# Patient Record
Sex: Male | Born: 1935
Health system: Southern US, Community
[De-identification: ages and names within clinical notes are randomized; demographics above are authoritative.]

## PROBLEM LIST (undated history)

## (undated) VITALS — BP 116/70 | HR 96 | Resp 16 | Ht 68.0 in | Wt 196.2 lb

## (undated) DIAGNOSIS — C801 Malignant (primary) neoplasm, unspecified: Secondary | ICD-10-CM

## (undated) DIAGNOSIS — D538 Other specified nutritional anemias: Secondary | ICD-10-CM

## (undated) DIAGNOSIS — I872 Venous insufficiency (chronic) (peripheral): Secondary | ICD-10-CM

## (undated) DIAGNOSIS — E559 Vitamin D deficiency, unspecified: Secondary | ICD-10-CM

## (undated) DIAGNOSIS — N183 Chronic kidney disease, stage 3 (moderate): Secondary | ICD-10-CM

## (undated) DIAGNOSIS — R0609 Other forms of dyspnea: Secondary | ICD-10-CM

## (undated) DIAGNOSIS — Z85828 Personal history of other malignant neoplasm of skin: Secondary | ICD-10-CM

## (undated) DIAGNOSIS — I5042 Chronic combined systolic (congestive) and diastolic (congestive) heart failure: Secondary | ICD-10-CM

## (undated) DIAGNOSIS — E785 Hyperlipidemia, unspecified: Secondary | ICD-10-CM

## (undated) DIAGNOSIS — D539 Nutritional anemia, unspecified: Secondary | ICD-10-CM

## (undated) DIAGNOSIS — R0683 Snoring: Secondary | ICD-10-CM

## (undated) DIAGNOSIS — E669 Obesity, unspecified: Secondary | ICD-10-CM

## (undated) DIAGNOSIS — E118 Type 2 diabetes mellitus with unspecified complications: Secondary | ICD-10-CM

## (undated) DIAGNOSIS — I10 Essential (primary) hypertension: Secondary | ICD-10-CM

## (undated) DIAGNOSIS — J449 Chronic obstructive pulmonary disease, unspecified: Secondary | ICD-10-CM

## (undated) DIAGNOSIS — R413 Other amnesia: Secondary | ICD-10-CM

## (undated) DIAGNOSIS — H269 Unspecified cataract: Secondary | ICD-10-CM

## (undated) DIAGNOSIS — R972 Elevated prostate specific antigen [PSA]: Secondary | ICD-10-CM

## (undated) DIAGNOSIS — M1990 Unspecified osteoarthritis, unspecified site: Secondary | ICD-10-CM

## (undated) DIAGNOSIS — T7840XA Allergy, unspecified, initial encounter: Secondary | ICD-10-CM

## (undated) DIAGNOSIS — R609 Edema, unspecified: Secondary | ICD-10-CM

## (undated) HISTORY — DX: Other specified nutritional anemias: D53.8

## (undated) HISTORY — DX: Personal history of other malignant neoplasm of skin: Z85.828

## (undated) HISTORY — DX: Other forms of dyspnea: R06.09

## (undated) HISTORY — DX: Malignant (primary) neoplasm, unspecified: C80.1

## (undated) HISTORY — DX: Type 2 diabetes mellitus with unspecified complications: E11.8

## (undated) HISTORY — PX: TOTAL KNEE ARTHROPLASTY: SHX125

## (undated) HISTORY — DX: Chronic obstructive pulmonary disease, unspecified: J44.9

## (undated) HISTORY — PX: KNEE SURGERY: SHX244

## (undated) HISTORY — DX: Elevated prostate specific antigen (PSA): R97.20

## (undated) HISTORY — DX: Allergy, unspecified, initial encounter: T78.40XA

## (undated) HISTORY — DX: Nutritional anemia, unspecified: D53.9

## (undated) HISTORY — DX: Vitamin D deficiency, unspecified: E55.9

## (undated) HISTORY — PX: JOINT REPLACEMENT: SHX530

## (undated) HISTORY — PX: INGUINAL HERNIA REPAIR: SUR1180

## (undated) HISTORY — PX: MELANOMA EXCISION: SHX5266

## (undated) HISTORY — DX: Chronic combined systolic (congestive) and diastolic (congestive) heart failure: I50.42

## (undated) HISTORY — DX: Unspecified cataract: H26.9

## (undated) HISTORY — DX: Unspecified osteoarthritis, unspecified site: M19.90

## (undated) HISTORY — DX: Other amnesia: R41.3

## (undated) HISTORY — DX: Hyperlipidemia, unspecified: E78.5

## (undated) HISTORY — DX: Obesity, unspecified: E66.9

## (undated) HISTORY — DX: Snoring: R06.83

## (undated) HISTORY — PX: CHOLECYSTECTOMY: SHX55

## (undated) HISTORY — DX: Chronic kidney disease, stage 3 (moderate): N18.3

## (undated) HISTORY — DX: Venous insufficiency (chronic) (peripheral): I87.2

## (undated) HISTORY — DX: Edema, unspecified: R60.9

## (undated) HISTORY — DX: Essential (primary) hypertension: I10

---

## 2001-03-13 ENCOUNTER — Encounter: Payer: Self-pay | Admitting: Surgery

## 2001-03-16 ENCOUNTER — Encounter (INDEPENDENT_AMBULATORY_CARE_PROVIDER_SITE_OTHER): Payer: Self-pay | Admitting: Specialist

## 2001-03-16 ENCOUNTER — Encounter: Payer: Self-pay | Admitting: Surgery

## 2001-03-16 ENCOUNTER — Ambulatory Visit (HOSPITAL_COMMUNITY): Admission: RE | Admit: 2001-03-16 | Discharge: 2001-03-16 | Payer: Self-pay | Admitting: Surgery

## 2001-10-17 ENCOUNTER — Encounter: Payer: Self-pay | Admitting: Oncology

## 2001-10-17 ENCOUNTER — Ambulatory Visit (HOSPITAL_COMMUNITY): Admission: RE | Admit: 2001-10-17 | Discharge: 2001-10-17 | Payer: Self-pay | Admitting: Oncology

## 2002-04-17 ENCOUNTER — Encounter: Payer: Self-pay | Admitting: Oncology

## 2002-04-17 ENCOUNTER — Ambulatory Visit (HOSPITAL_COMMUNITY): Admission: RE | Admit: 2002-04-17 | Discharge: 2002-04-17 | Payer: Self-pay | Admitting: Oncology

## 2002-06-13 ENCOUNTER — Encounter: Payer: Self-pay | Admitting: Orthopedic Surgery

## 2002-06-13 ENCOUNTER — Inpatient Hospital Stay (HOSPITAL_COMMUNITY): Admission: RE | Admit: 2002-06-13 | Discharge: 2002-06-18 | Payer: Self-pay | Admitting: Orthopedic Surgery

## 2002-06-17 ENCOUNTER — Encounter: Payer: Self-pay | Admitting: Orthopedic Surgery

## 2002-07-30 ENCOUNTER — Ambulatory Visit (HOSPITAL_COMMUNITY): Admission: RE | Admit: 2002-07-30 | Discharge: 2002-07-30 | Payer: Self-pay | Admitting: General Surgery

## 2002-08-30 ENCOUNTER — Encounter: Payer: Self-pay | Admitting: Orthopedic Surgery

## 2002-08-30 ENCOUNTER — Encounter: Admission: RE | Admit: 2002-08-30 | Discharge: 2002-08-30 | Payer: Self-pay | Admitting: Orthopedic Surgery

## 2003-01-23 ENCOUNTER — Ambulatory Visit (HOSPITAL_BASED_OUTPATIENT_CLINIC_OR_DEPARTMENT_OTHER): Admission: RE | Admit: 2003-01-23 | Discharge: 2003-01-23 | Payer: Self-pay | Admitting: Family Medicine

## 2003-02-17 ENCOUNTER — Ambulatory Visit (HOSPITAL_COMMUNITY): Admission: RE | Admit: 2003-02-17 | Discharge: 2003-02-17 | Payer: Self-pay | Admitting: Gastroenterology

## 2003-03-20 LAB — HM COLONOSCOPY: HM Colonoscopy: NORMAL

## 2003-04-23 ENCOUNTER — Ambulatory Visit: Admission: RE | Admit: 2003-04-23 | Discharge: 2003-04-23 | Payer: Self-pay | Admitting: Oncology

## 2003-04-23 ENCOUNTER — Encounter: Payer: Self-pay | Admitting: Oncology

## 2003-05-06 ENCOUNTER — Encounter: Admission: RE | Admit: 2003-05-06 | Discharge: 2003-05-06 | Payer: Self-pay | Admitting: Internal Medicine

## 2003-05-14 ENCOUNTER — Inpatient Hospital Stay (HOSPITAL_COMMUNITY): Admission: RE | Admit: 2003-05-14 | Discharge: 2003-05-19 | Payer: Self-pay | Admitting: Orthopedic Surgery

## 2003-05-14 ENCOUNTER — Encounter (INDEPENDENT_AMBULATORY_CARE_PROVIDER_SITE_OTHER): Payer: Self-pay | Admitting: *Deleted

## 2003-05-15 ENCOUNTER — Encounter: Payer: Self-pay | Admitting: Orthopedic Surgery

## 2003-06-03 ENCOUNTER — Encounter: Admission: RE | Admit: 2003-06-03 | Discharge: 2003-06-03 | Payer: Self-pay | Admitting: Infectious Diseases

## 2003-06-16 ENCOUNTER — Ambulatory Visit (HOSPITAL_COMMUNITY): Admission: RE | Admit: 2003-06-16 | Discharge: 2003-06-16 | Payer: Self-pay | Admitting: Infectious Diseases

## 2003-06-16 ENCOUNTER — Encounter: Payer: Self-pay | Admitting: Infectious Diseases

## 2003-06-25 ENCOUNTER — Inpatient Hospital Stay (HOSPITAL_COMMUNITY): Admission: RE | Admit: 2003-06-25 | Discharge: 2003-06-30 | Payer: Self-pay | Admitting: Orthopedic Surgery

## 2003-07-28 ENCOUNTER — Encounter: Admission: RE | Admit: 2003-07-28 | Discharge: 2003-10-26 | Payer: Self-pay | Admitting: Orthopedic Surgery

## 2003-10-17 ENCOUNTER — Ambulatory Visit (HOSPITAL_COMMUNITY): Admission: RE | Admit: 2003-10-17 | Discharge: 2003-10-17 | Payer: Self-pay | Admitting: General Surgery

## 2003-11-06 ENCOUNTER — Encounter: Admission: RE | Admit: 2003-11-06 | Discharge: 2004-01-01 | Payer: Self-pay | Admitting: Orthopedic Surgery

## 2004-02-26 ENCOUNTER — Ambulatory Visit (HOSPITAL_COMMUNITY): Admission: RE | Admit: 2004-02-26 | Discharge: 2004-02-26 | Payer: Self-pay | Admitting: Surgery

## 2004-02-28 ENCOUNTER — Ambulatory Visit (HOSPITAL_COMMUNITY): Admission: RE | Admit: 2004-02-28 | Discharge: 2004-02-28 | Payer: Self-pay | Admitting: Surgery

## 2004-03-03 ENCOUNTER — Ambulatory Visit (HOSPITAL_COMMUNITY): Admission: RE | Admit: 2004-03-03 | Discharge: 2004-03-03 | Payer: Self-pay | Admitting: Oncology

## 2004-04-07 ENCOUNTER — Ambulatory Visit (HOSPITAL_COMMUNITY): Admission: RE | Admit: 2004-04-07 | Discharge: 2004-04-07 | Payer: Self-pay | Admitting: Surgery

## 2004-04-07 ENCOUNTER — Ambulatory Visit (HOSPITAL_BASED_OUTPATIENT_CLINIC_OR_DEPARTMENT_OTHER): Admission: RE | Admit: 2004-04-07 | Discharge: 2004-04-07 | Payer: Self-pay | Admitting: Surgery

## 2004-04-07 ENCOUNTER — Encounter (INDEPENDENT_AMBULATORY_CARE_PROVIDER_SITE_OTHER): Payer: Self-pay | Admitting: *Deleted

## 2004-05-05 ENCOUNTER — Encounter (INDEPENDENT_AMBULATORY_CARE_PROVIDER_SITE_OTHER): Payer: Self-pay | Admitting: *Deleted

## 2004-05-05 ENCOUNTER — Observation Stay (HOSPITAL_COMMUNITY): Admission: RE | Admit: 2004-05-05 | Discharge: 2004-05-06 | Payer: Self-pay | Admitting: Surgery

## 2004-05-13 ENCOUNTER — Ambulatory Visit (HOSPITAL_COMMUNITY): Admission: RE | Admit: 2004-05-13 | Discharge: 2004-05-13 | Payer: Self-pay | Admitting: Oncology

## 2004-08-16 ENCOUNTER — Ambulatory Visit (HOSPITAL_COMMUNITY): Admission: RE | Admit: 2004-08-16 | Discharge: 2004-08-16 | Payer: Self-pay | Admitting: Oncology

## 2004-08-19 ENCOUNTER — Ambulatory Visit: Payer: Self-pay | Admitting: Oncology

## 2004-08-24 ENCOUNTER — Ambulatory Visit (HOSPITAL_COMMUNITY): Admission: RE | Admit: 2004-08-24 | Discharge: 2004-08-24 | Payer: Self-pay | Admitting: Oncology

## 2004-10-01 ENCOUNTER — Ambulatory Visit (HOSPITAL_COMMUNITY): Admission: RE | Admit: 2004-10-01 | Discharge: 2004-10-01 | Payer: Self-pay | Admitting: General Surgery

## 2005-03-30 ENCOUNTER — Ambulatory Visit: Payer: Self-pay | Admitting: Oncology

## 2005-04-01 ENCOUNTER — Ambulatory Visit (HOSPITAL_COMMUNITY): Admission: RE | Admit: 2005-04-01 | Discharge: 2005-04-01 | Payer: Self-pay | Admitting: Oncology

## 2005-09-28 ENCOUNTER — Ambulatory Visit: Payer: Self-pay | Admitting: Oncology

## 2005-10-05 ENCOUNTER — Ambulatory Visit (HOSPITAL_COMMUNITY): Admission: RE | Admit: 2005-10-05 | Discharge: 2005-10-05 | Payer: Self-pay | Admitting: Oncology

## 2006-05-30 ENCOUNTER — Ambulatory Visit: Payer: Self-pay | Admitting: Oncology

## 2008-09-25 ENCOUNTER — Encounter: Payer: Self-pay | Admitting: Internal Medicine

## 2008-11-10 ENCOUNTER — Ambulatory Visit: Payer: Self-pay | Admitting: Internal Medicine

## 2008-11-10 DIAGNOSIS — Z85828 Personal history of other malignant neoplasm of skin: Secondary | ICD-10-CM

## 2008-11-10 DIAGNOSIS — E119 Type 2 diabetes mellitus without complications: Secondary | ICD-10-CM | POA: Insufficient documentation

## 2008-11-10 DIAGNOSIS — I10 Essential (primary) hypertension: Secondary | ICD-10-CM

## 2008-11-10 DIAGNOSIS — E118 Type 2 diabetes mellitus with unspecified complications: Secondary | ICD-10-CM

## 2008-11-10 DIAGNOSIS — M1990 Unspecified osteoarthritis, unspecified site: Secondary | ICD-10-CM | POA: Insufficient documentation

## 2008-11-10 DIAGNOSIS — E785 Hyperlipidemia, unspecified: Secondary | ICD-10-CM | POA: Insufficient documentation

## 2008-11-10 DIAGNOSIS — E559 Vitamin D deficiency, unspecified: Secondary | ICD-10-CM | POA: Insufficient documentation

## 2008-11-10 HISTORY — DX: Personal history of other malignant neoplasm of skin: Z85.828

## 2008-11-10 HISTORY — DX: Vitamin D deficiency, unspecified: E55.9

## 2008-11-10 HISTORY — DX: Type 2 diabetes mellitus with unspecified complications: E11.8

## 2008-11-10 HISTORY — DX: Hyperlipidemia, unspecified: E78.5

## 2008-11-10 LAB — CONVERTED CEMR LAB
ALT: 27 units/L (ref 0–53)
BUN: 17 mg/dL (ref 6–23)
Basophils Absolute: 0 10*3/uL (ref 0.0–0.1)
Bilirubin, Direct: 0.1 mg/dL (ref 0.0–0.3)
CO2: 31 meq/L (ref 19–32)
Cholesterol: 133 mg/dL (ref 0–200)
Eosinophils Absolute: 0.3 10*3/uL (ref 0.0–0.7)
GFR calc Af Amer: 105 mL/min
GFR calc non Af Amer: 87 mL/min
HCT: 44.6 % (ref 39.0–52.0)
HDL: 40.1 mg/dL (ref >39.0)
Ketones, ur: NEGATIVE mg/dL
LDL Cholesterol: 81 mg/dL (ref 0–99)
Leukocytes, UA: NEGATIVE
Lymphocytes Relative: 37.6 % (ref 12.0–46.0)
MCHC: 34.5 g/dL (ref 30.0–36.0)
MCV: 85.4 fL (ref 78.0–100.0)
Microalb Creat Ratio: 2.3 mg/g (ref 0.0–30.0)
Microalb, Ur: 0.4 mg/dL (ref 0.0–1.9)
Mucus, UA: NEGATIVE
Neutro Abs: 3.9 10*3/uL (ref 1.4–7.7)
Platelets: 218 10*3/uL (ref 150–400)
Potassium: 4.6 meq/L (ref 3.5–5.1)
Sodium: 143 meq/L (ref 135–145)
Total Bilirubin: 1 mg/dL (ref 0.3–1.2)
Total Protein: 6.4 g/dL (ref 6.0–8.3)
Triglycerides: 59 mg/dL (ref 0–149)
Urine Glucose: NEGATIVE mg/dL
VLDL: 12 mg/dL (ref 0–40)
Vit D, 25-Hydroxy: 42 ng/mL (ref 30–89)
pH: 5 (ref 5.0–8.0)

## 2008-11-11 ENCOUNTER — Encounter: Payer: Self-pay | Admitting: Internal Medicine

## 2009-01-27 ENCOUNTER — Encounter: Payer: Self-pay | Admitting: Internal Medicine

## 2009-02-10 ENCOUNTER — Ambulatory Visit: Payer: Self-pay | Admitting: Internal Medicine

## 2009-02-10 LAB — CONVERTED CEMR LAB
ALT: 33 units/L (ref 0–53)
AST: 29 units/L (ref 0–37)
Albumin: 3.7 g/dL (ref 3.5–5.2)
Alkaline Phosphatase: 95 units/L (ref 39–117)
BUN: 14 mg/dL (ref 6–23)
Basophils Absolute: 0.1 10*3/uL (ref 0.0–0.1)
Basophils Relative: 0.8 % (ref 0.0–3.0)
Bilirubin, Direct: 0.2 mg/dL (ref 0.0–0.3)
CO2: 32 meq/L (ref 19–32)
Calcium: 8.9 mg/dL (ref 8.4–10.5)
GFR calc non Af Amer: 86.49 mL/min (ref >60)
Hemoglobin, Urine: NEGATIVE
Ketones, ur: NEGATIVE mg/dL
LDL Goal: 100 mg/dL
Lymphs Abs: 2.9 10*3/uL (ref 0.7–4.0)
MCHC: 35.1 g/dL (ref 30.0–36.0)
Microalb Creat Ratio: 2 mg/g (ref 0.0–30.0)
Monocytes Absolute: 0.7 10*3/uL (ref 0.1–1.0)
Nitrite: NEGATIVE
Platelets: 229 10*3/uL (ref 150.0–400.0)
RDW: 12.9 % (ref 11.5–14.6)
Sodium: 143 meq/L (ref 135–145)
TSH: 2.14 microintl units/mL (ref 0.35–5.50)
Total Bilirubin: 1 mg/dL (ref 0.3–1.2)
Total CHOL/HDL Ratio: 3
Total CK: 89 units/L (ref 7–232)
Total Protein: 6.4 g/dL (ref 6.0–8.3)
VLDL: 13.2 mg/dL (ref 0.0–40.0)
WBC: 8.5 10*3/uL (ref 4.5–10.5)
pH: 5.5 (ref 5.0–8.0)

## 2009-02-11 ENCOUNTER — Encounter: Payer: Self-pay | Admitting: Internal Medicine

## 2009-05-14 ENCOUNTER — Ambulatory Visit: Payer: Self-pay | Admitting: Internal Medicine

## 2009-05-14 DIAGNOSIS — R413 Other amnesia: Secondary | ICD-10-CM

## 2009-05-14 HISTORY — DX: Other amnesia: R41.3

## 2009-05-14 LAB — CONVERTED CEMR LAB
CO2: 31 meq/L (ref 19–32)
Chloride: 105 meq/L (ref 96–112)
Potassium: 4.6 meq/L (ref 3.5–5.1)
Total CK: 67 units/L (ref 7–232)

## 2009-06-11 ENCOUNTER — Ambulatory Visit: Payer: Self-pay | Admitting: Internal Medicine

## 2009-06-11 DIAGNOSIS — R609 Edema, unspecified: Secondary | ICD-10-CM

## 2009-06-11 HISTORY — DX: Edema, unspecified: R60.9

## 2009-08-03 ENCOUNTER — Telehealth: Payer: Self-pay | Admitting: Internal Medicine

## 2009-09-10 ENCOUNTER — Ambulatory Visit: Payer: Self-pay | Admitting: Internal Medicine

## 2009-09-10 LAB — CONVERTED CEMR LAB
AST: 30 units/L (ref 0–37)
Albumin: 3.9 g/dL (ref 3.5–5.2)
Alkaline Phosphatase: 87 units/L (ref 39–117)
Basophils Absolute: 0.1 10*3/uL (ref 0.0–0.1)
Basophils Relative: 0.7 % (ref 0.0–3.0)
Bilirubin, Direct: 0.1 mg/dL (ref 0.0–0.3)
CO2: 28 meq/L (ref 19–32)
Cholesterol: 183 mg/dL (ref 0–200)
Creatinine, Ser: 1 mg/dL (ref 0.4–1.5)
Creatinine,U: 274.8 mg/dL
Eosinophils Relative: 2.8 % (ref 0.0–5.0)
GFR calc non Af Amer: 76.47 mL/min (ref >60)
HCT: 44.2 % (ref 39.0–52.0)
Hemoglobin, Urine: NEGATIVE
Lymphocytes Relative: 34.6 % (ref 12.0–46.0)
MCV: 87.8 fL (ref 78.0–100.0)
Microalb Creat Ratio: 2.5 mg/g (ref 0.0–30.0)
Monocytes Absolute: 0.7 10*3/uL (ref 0.1–1.0)
Neutrophils Relative %: 52.5 % (ref 43.0–77.0)
RDW: 13.9 % (ref 11.5–14.6)
Sodium: 138 meq/L (ref 135–145)
Total CHOL/HDL Ratio: 4
Total Protein: 6.4 g/dL (ref 6.0–8.3)
Urine Glucose: NEGATIVE mg/dL
VLDL: 10.6 mg/dL (ref 0.0–40.0)
pH: 5 (ref 5.0–8.0)

## 2009-09-11 ENCOUNTER — Encounter: Payer: Self-pay | Admitting: Internal Medicine

## 2009-10-19 ENCOUNTER — Encounter: Payer: Self-pay | Admitting: Internal Medicine

## 2010-01-07 ENCOUNTER — Ambulatory Visit: Payer: Self-pay | Admitting: Internal Medicine

## 2010-01-07 LAB — CONVERTED CEMR LAB
ALT: 21 units/L (ref 0–53)
BUN: 19 mg/dL (ref 6–23)
Basophils Relative: 0.5 % (ref 0.0–3.0)
Bilirubin Urine: NEGATIVE
CO2: 29 meq/L (ref 19–32)
Creatinine, Ser: 0.8 mg/dL (ref 0.4–1.5)
Glucose, Bld: 74 mg/dL (ref 70–99)
HDL: 44.6 mg/dL (ref >39.00)
Hemoglobin: 14.7 g/dL (ref 13.0–17.0)
Ketones, ur: NEGATIVE mg/dL
LDL Cholesterol: 109 mg/dL — ABNORMAL HIGH (ref 0–99)
Leukocytes, UA: NEGATIVE
Lymphocytes Relative: 36.5 % (ref 12.0–46.0)
Monocytes Relative: 8.1 % (ref 3.0–12.0)
Neutrophils Relative %: 52.2 % (ref 43.0–77.0)
Nitrite: NEGATIVE
Potassium: 4.4 meq/L (ref 3.5–5.1)
RDW: 14.7 % — ABNORMAL HIGH (ref 11.5–14.6)
Sodium: 143 meq/L (ref 135–145)
Specific Gravity, Urine: >=1.03 (ref 1.000–1.030)
Total Bilirubin: 1 mg/dL (ref 0.3–1.2)
Triglycerides: 49 mg/dL (ref 0.0–149.0)
Urobilinogen, UA: 0.2 (ref 0.0–1.0)
Vit D, 25-Hydroxy: 32 ng/mL (ref 30–89)
WBC: 8.5 10*3/uL (ref 4.5–10.5)
pH: 6 (ref 5.0–8.0)

## 2010-01-27 ENCOUNTER — Telehealth: Payer: Self-pay | Admitting: Internal Medicine

## 2010-05-12 ENCOUNTER — Ambulatory Visit: Payer: Self-pay | Admitting: Internal Medicine

## 2010-05-12 LAB — CONVERTED CEMR LAB
AST: 36 units/L (ref 0–37)
Albumin: 4 g/dL (ref 3.5–5.2)
Alkaline Phosphatase: 86 units/L (ref 39–117)
BUN: 19 mg/dL (ref 6–23)
Basophils Absolute: 0 10*3/uL (ref 0.0–0.1)
Calcium: 9.7 mg/dL (ref 8.4–10.5)
Cholesterol: 98 mg/dL (ref 0–200)
Creatinine, Ser: 0.9 mg/dL (ref 0.4–1.5)
Eosinophils Relative: 1.7 % (ref 0.0–5.0)
GFR calc non Af Amer: 84.05 mL/min (ref >60)
Glucose, Bld: 82 mg/dL (ref 70–99)
HCT: 42 % (ref 39.0–52.0)
Hemoglobin: 14.4 g/dL (ref 13.0–17.0)
Hgb A1c MFr Bld: 8.1 % — ABNORMAL HIGH (ref 4.6–6.5)
Lymphocytes Relative: 32.1 % (ref 12.0–46.0)
MCV: 87.2 fL (ref 78.0–100.0)
Neutro Abs: 5.6 10*3/uL (ref 1.4–7.7)
Potassium: 5.1 meq/L (ref 3.5–5.1)
RDW: 13.8 % (ref 11.5–14.6)
TSH: 1.64 microintl units/mL (ref 0.35–5.50)
Total Bilirubin: 0.7 mg/dL (ref 0.3–1.2)
Triglycerides: 50 mg/dL (ref 0.0–149.0)

## 2010-07-26 ENCOUNTER — Inpatient Hospital Stay (HOSPITAL_COMMUNITY): Admission: RE | Admit: 2010-07-26 | Discharge: 2010-07-29 | Payer: Self-pay | Admitting: Orthopedic Surgery

## 2010-07-31 ENCOUNTER — Emergency Department (HOSPITAL_COMMUNITY)
Admission: EM | Admit: 2010-07-31 | Discharge: 2010-08-01 | Payer: Self-pay | Source: Home / Self Care | Admitting: Emergency Medicine

## 2010-08-02 ENCOUNTER — Encounter: Payer: Self-pay | Admitting: Internal Medicine

## 2010-08-04 ENCOUNTER — Telehealth: Payer: Self-pay | Admitting: Internal Medicine

## 2010-08-11 ENCOUNTER — Telehealth: Payer: Self-pay | Admitting: Internal Medicine

## 2010-08-11 ENCOUNTER — Ambulatory Visit: Payer: Self-pay | Admitting: Internal Medicine

## 2010-08-11 ENCOUNTER — Encounter: Payer: Self-pay | Admitting: Internal Medicine

## 2010-08-11 DIAGNOSIS — D538 Other specified nutritional anemias: Secondary | ICD-10-CM | POA: Insufficient documentation

## 2010-08-11 DIAGNOSIS — I959 Hypotension, unspecified: Secondary | ICD-10-CM | POA: Insufficient documentation

## 2010-08-11 HISTORY — DX: Other specified nutritional anemias: D53.8

## 2010-08-11 LAB — CONVERTED CEMR LAB
Albumin: 2.9 g/dL — ABNORMAL LOW (ref 3.5–5.2)
Alkaline Phosphatase: 102 units/L (ref 39–117)
BUN: 12 mg/dL (ref 6–23)
Basophils Absolute: 0 10*3/uL (ref 0.0–0.1)
Basophils Relative: 0.3 % (ref 0.0–3.0)
Bilirubin, Direct: 0.2 mg/dL (ref 0.0–0.3)
CK-MB: 1.8 ng/mL (ref 0.3–4.0)
Calcium: 8.7 mg/dL (ref 8.4–10.5)
Chloride: 99 meq/L (ref 96–112)
Creatinine, Ser: 1 mg/dL (ref 0.4–1.5)
Eosinophils Absolute: 0.1 10*3/uL (ref 0.0–0.7)
Folate: 10.1 ng/mL
Glucose, Bld: 286 mg/dL — ABNORMAL HIGH (ref 70–99)
Hemoglobin: 10.8 g/dL — ABNORMAL LOW (ref 13.0–17.0)
Hgb A1c MFr Bld: 8 % — ABNORMAL HIGH (ref 4.6–6.5)
INR: 1.3 — ABNORMAL HIGH (ref 0.8–1.0)
Lymphocytes Relative: 12.4 % (ref 12.0–46.0)
Lymphs Abs: 1.7 10*3/uL (ref 0.7–4.0)
Monocytes Absolute: 1 10*3/uL (ref 0.1–1.0)
Neutro Abs: 11.1 10*3/uL — ABNORMAL HIGH (ref 1.4–7.7)
Neutrophils Relative %: 78.9 % — ABNORMAL HIGH (ref 43.0–77.0)
RBC: 3.69 M/uL — ABNORMAL LOW (ref 4.22–5.81)
RDW: 14.2 % (ref 11.5–14.6)
Relative Index: 4.5 — ABNORMAL HIGH (ref 0.0–2.5)
Saturation Ratios: 14.9 % — ABNORMAL LOW (ref 20.0–50.0)
TSH: 3.01 microintl units/mL (ref 0.35–5.50)
Total CK: 40 units/L (ref 7–232)
Total Protein: 5.4 g/dL — ABNORMAL LOW (ref 6.0–8.3)
WBC: 14 10*3/uL — ABNORMAL HIGH (ref 4.5–10.5)

## 2010-08-12 ENCOUNTER — Encounter: Payer: Self-pay | Admitting: Internal Medicine

## 2010-08-12 ENCOUNTER — Telehealth: Payer: Self-pay | Admitting: Internal Medicine

## 2010-08-13 ENCOUNTER — Ambulatory Visit: Payer: Self-pay | Admitting: Internal Medicine

## 2010-08-13 DIAGNOSIS — E538 Deficiency of other specified B group vitamins: Secondary | ICD-10-CM | POA: Insufficient documentation

## 2010-08-27 ENCOUNTER — Ambulatory Visit: Payer: Self-pay | Admitting: Internal Medicine

## 2010-08-30 ENCOUNTER — Encounter
Admission: RE | Admit: 2010-08-30 | Discharge: 2010-09-28 | Payer: Self-pay | Source: Home / Self Care | Attending: Orthopedic Surgery | Admitting: Orthopedic Surgery

## 2010-09-06 ENCOUNTER — Encounter: Payer: Self-pay | Admitting: Internal Medicine

## 2010-09-09 ENCOUNTER — Ambulatory Visit
Admission: RE | Admit: 2010-09-09 | Discharge: 2010-09-09 | Payer: Self-pay | Source: Home / Self Care | Attending: Internal Medicine | Admitting: Internal Medicine

## 2010-09-26 LAB — CONVERTED CEMR LAB
ALT: 24 units/L (ref 0–53)
BUN: 13 mg/dL (ref 6–23)
Basophils Absolute: 0 10*3/uL (ref 0.0–0.1)
Basophils Relative: 0.6 % (ref 0.0–3.0)
Calcium: 9.1 mg/dL (ref 8.4–10.5)
Chloride: 104 meq/L (ref 96–112)
GFR calc non Af Amer: 86.42 mL/min (ref >60)
HCT: 46.1 % (ref 39.0–52.0)
Hemoglobin, Urine: NEGATIVE
Hemoglobin: 15.3 g/dL (ref 13.0–17.0)
Hgb A1c MFr Bld: 7.5 % — ABNORMAL HIGH (ref 4.6–6.5)
Leukocytes, UA: NEGATIVE
Lymphocytes Relative: 32.5 % (ref 12.0–46.0)
Lymphs Abs: 2.7 10*3/uL (ref 0.7–4.0)
Monocytes Absolute: 0.6 10*3/uL (ref 0.1–1.0)
Monocytes Relative: 7.4 % (ref 3.0–12.0)
Neutrophils Relative %: 56.6 % (ref 43.0–77.0)
Nitrite: NEGATIVE
Platelets: 236 10*3/uL (ref 150.0–400.0)
Potassium: 4.5 meq/L (ref 3.5–5.1)
RBC: 5.26 M/uL (ref 4.22–5.81)
RDW: 13 % (ref 11.5–14.6)
Specific Gravity, Urine: >=1.03 (ref 1.000–1.030)
Total Protein, Urine: NEGATIVE mg/dL
Total Protein: 6.5 g/dL (ref 6.0–8.3)
Urobilinogen, UA: 0.2 (ref 0.0–1.0)

## 2010-09-30 NOTE — Assessment & Plan Note (Signed)
Summary: 3 month follow up-lb   Vital Signs:  Patient profile:   77 year old male Height:      68 inches Weight:      196 pounds BMI:     29.91 O2 Sat:      94 % on Room air Temp:     97.5 degrees F oral Pulse rate:   97 / minute Pulse rhythm:   regular Resp:     16 per minute BP sitting:   82 / 50  (left arm) Cuff size:   large  Vitals Entered By: Rock Nephew CMA (August 11, 2010 10:17 AM)  Nutrition Counseling: Patient's BMI is greater than 25 and therefore counseled on weight management options.  O2 Flow:  Room air CC: follow-up visit// pt c/o weaknees and dizziness, Hypertension Management Is Patient Diabetic? Yes Did you bring your meter with you today? No  Does patient need assistance? Functional Status Self care Ambulation Normal   Primary Care Chrysa Rampy:  Etta Grandchild MD  CC:  follow-up visit// pt c/o weaknees and dizziness and Hypertension Management.  History of Present Illness: He returns for f/up and he tells me that he has been having diarrhea and dizziness with low blood pressure for about 2 weeks. His diarrhea is about 4 stools per day that are loose and high volume but no blood or mucous and no abd pain or cramping. He has been on coumadin since he had left knee surgery about 3 weeks ago and has been seeing a Insurance underwriter about urinary retention and a UTI, Rapaflo is  a new med as of 3 days ago given to him by Dr. Isabel Caprice and he has been taking cipro for the uti.  Hypertension History:      He complains of neurologic problems, but denies headache, chest pain, palpitations, dyspnea with exertion, orthopnea, PND, peripheral edema, visual symptoms, syncope, and side effects from treatment.  He notes the following problems with antihypertensive medication side effects: dizziness and low blood pressure.        Positive major cardiovascular risk factors include male age 63 years old or older, diabetes, hyperlipidemia, and hypertension.  Negative major  cardiovascular risk factors include negative family history for ischemic heart disease and non-tobacco-user status.        Further assessment for target organ damage reveals no history of ASHD, cardiac end-organ damage (CHF/LVH), stroke/TIA, peripheral vascular disease, renal insufficiency, or hypertensive retinopathy.     Allergies: 1)  ! Percocet 2)  ! Penicillin 3)  ! Ace Inhibitors  Past History:  Past Medical History: Last updated: 11/10/2008 Skin cancer, hx of Diabetes mellitus, type II Osteoarthritis Vitamin D deficiency Hyperlipidemia Hypertension  Past Surgical History: Last updated: 11/10/2008 Inguinal herniorrhaphy Total knee replacement Cholecystectomy  Family History: Last updated: 11/10/2008 Family History Diabetes 1st degree relative  Social History: Last updated: 11/10/2008 Retired Married Never Smoked Alcohol use-no Drug use-no Regular exercise-no  Risk Factors: Alcohol Use: 0 (05/12/2010) >5 drinks/d w/in last 3 months: no (05/12/2010) Exercise: no (11/10/2008)  Risk Factors: Smoking Status: never (05/12/2010)  Family History: Reviewed history from 11/10/2008 and no changes required. Family History Diabetes 1st degree relative  Social History: Reviewed history from 11/10/2008 and no changes required. Retired Married Never Smoked Alcohol use-no Drug use-no Regular exercise-no  Review of Systems  The patient denies anorexia, fever, weight loss, weight gain, chest pain, syncope, dyspnea on exertion, prolonged cough, headaches, hemoptysis, abdominal pain, melena, hematochezia, severe indigestion/heartburn, hematuria, muscle weakness, suspicious skin lesions, transient  blindness, difficulty walking, abnormal bleeding, enlarged lymph nodes, and angioedema.   GI:  Complains of change in bowel habits and diarrhea; denies abdominal pain, bloody stools, constipation, dark tarry stools, excessive appetite, gas, hemorrhoids, indigestion, loss of  appetite, nausea, vomiting, vomiting blood, and yellowish skin color. Endo:  Denies cold intolerance, excessive hunger, excessive thirst, excessive urination, heat intolerance, polyuria, and weight change. Heme:  Denies abnormal bruising, bleeding, enlarge lymph nodes, fevers, pallor, and skin discoloration.  Physical Exam  General:  alert, well-developed, well-nourished, well-hydrated, pale, poorly cooperative to examination, and unable to place on exam table.   Head:  normocephalic, atraumatic, no abnormalities observed, and no abnormalities palpated.   Eyes:  mm are pale Neck:  supple, full ROM, no masses, no JVD, no cervical lymphadenopathy, and no neck tenderness.   Lungs:  normal respiratory effort, no intercostal retractions, no accessory muscle use, normal breath sounds, no dullness, and no fremitus.   Heart:  Normal rate and regular rhythm. S1 and S2 normal without gallop, murmur, click, rub or other extra sounds. Abdomen:  soft, non-tender, normal bowel sounds, no distention, no masses, no guarding, no rigidity, no rebound tenderness, no hepatomegaly, and no splenomegaly.   Msk:  normal ROM, no joint tenderness, no joint swelling, and no joint warmth.   Pulses:  R and L carotid,radial,femoral,dorsalis pedis and posterior tibial pulses are full and equal bilaterally Extremities:  1+ left pedal edema and 1+ right pedal edema.   Neurologic:  alert & oriented X3, cranial nerves II-XII intact, strength normal in all extremities, gait normal, and DTRs symmetrical and normal.   Skin:  turgor normal, color normal, no rashes, no suspicious lesions, no ecchymoses, no petechiae, and no purpura.   Cervical Nodes:  No lymphadenopathy noted Axillary Nodes:  No palpable lymphadenopathy Psych:  Oriented X3, normally interactive, good eye contact, not anxious appearing, not depressed appearing, poor concentration, memory impairment, and judgment fair.    Diabetes Management Exam:    Foot Exam (with  socks and/or shoes not present):       Sensory-Pinprick/Light touch:          Left medial foot (L-4): normal          Left dorsal foot (L-5): normal          Left lateral foot (S-1): normal          Right medial foot (L-4): normal          Right dorsal foot (L-5): normal          Right lateral foot (S-1): normal       Sensory-Monofilament:          Left foot: normal          Right foot: normal       Inspection:          Left foot: normal          Right foot: normal       Nails:          Left foot: normal          Right foot: normal   Impression & Recommendations:  Problem # 1:  DIARRHEA (ICD-787.91) Assessment New will check for infectious causes Orders: T-C diff by PCR (04540) T-Stool Giardia / Crypto- EIA (98119) T-Stool for O&P (14782-95621)  Problem # 2:  ANTICOAGULATION RX (ICD-V58.61) Assessment: New  Orders: Venipuncture (30865) TLB-B12 + Folate Pnl (78469_62952-W41/LKG) TLB-IBC Pnl (Iron/FE;Transferrin) (83550-IBC) TLB-BMP (Basic Metabolic Panel-BMET) (80048-METABOL) TLB-CBC Platelet - w/Differential (85025-CBCD)  TLB-Hepatic/Liver Function Pnl (80076-HEPATIC) TLB-TSH (Thyroid Stimulating Hormone) (84443-TSH) TLB-Cardiac Panel (66063_01601-UXNA) TLB-BNP (B-Natriuretic Peptide) (83880-BNPR) TLB-A1C / Hgb A1C (Glycohemoglobin) (83036-A1C) TLB-PT (Protime) (85610-PTP)  Problem # 3:  ANEMIA ASSOCIATED W/OTHER SPEC NUTRITIONAL DEFIC (ICD-281.8) Assessment: New  Orders: Venipuncture (35573) TLB-B12 + Folate Pnl (22025_42706-C37/SEG) TLB-IBC Pnl (Iron/FE;Transferrin) (83550-IBC) TLB-BMP (Basic Metabolic Panel-BMET) (80048-METABOL) TLB-CBC Platelet - w/Differential (85025-CBCD) TLB-Hepatic/Liver Function Pnl (80076-HEPATIC) TLB-TSH (Thyroid Stimulating Hormone) (84443-TSH) TLB-Cardiac Panel (31517_61607-PXTG) TLB-BNP (B-Natriuretic Peptide) (83880-BNPR) TLB-A1C / Hgb A1C (Glycohemoglobin) (83036-A1C)  Hgb: 14.4 (05/12/2010)   Hct: 42.0 (05/12/2010)    Platelets: 219.0 (05/12/2010) RBC: 4.82 (05/12/2010)   RDW: 13.8 (05/12/2010)   WBC: 9.7 (05/12/2010) MCV: 87.2 (05/12/2010)   MCHC: 34.3 (05/12/2010) TSH: 1.64 (05/12/2010)  Problem # 4:  HYPOTENSION (ICD-458.9) Assessment: New stop Diovan and Rapaflo and check for anemia, dehydration, renal failure, abnormal electrolytes Orders: Venipuncture (62694) TLB-B12 + Folate Pnl (85462_70350-K93/GHW) TLB-IBC Pnl (Iron/FE;Transferrin) (83550-IBC) TLB-BMP (Basic Metabolic Panel-BMET) (80048-METABOL) TLB-CBC Platelet - w/Differential (85025-CBCD) TLB-Hepatic/Liver Function Pnl (80076-HEPATIC) TLB-TSH (Thyroid Stimulating Hormone) (84443-TSH) TLB-Cardiac Panel (29937_16967-ELFY) TLB-BNP (B-Natriuretic Peptide) (83880-BNPR) TLB-A1C / Hgb A1C (Glycohemoglobin) (83036-A1C)  Problem # 5:  DIABETES MELLITUS, TYPE II (ICD-250.00) Assessment: Unchanged  The following medications were removed from the medication list:    Diovan 80 Mg Tabs (Valsartan) ..... Once daily His updated medication list for this problem includes:    Lantus Solostar 100 Unit/ml Soln (Insulin glargine) .Marland Kitchen... 20  units sq  daily    Aspirin 81 Mg Tabs (Aspirin) .Marland Kitchen... Take 1 tablet by mouth once a day    Janumet 50-1000 Mg Tabs (Sitagliptin-metformin hcl) ..... One by mouth two times a day for diabetes  Orders: Venipuncture (10175) TLB-B12 + Folate Pnl (10258_52778-E42/PNT) TLB-IBC Pnl (Iron/FE;Transferrin) (83550-IBC) TLB-BMP (Basic Metabolic Panel-BMET) (80048-METABOL) TLB-CBC Platelet - w/Differential (85025-CBCD) TLB-Hepatic/Liver Function Pnl (80076-HEPATIC) TLB-TSH (Thyroid Stimulating Hormone) (84443-TSH) TLB-Cardiac Panel (61443_15400-QQPY) TLB-BNP (B-Natriuretic Peptide) (83880-BNPR) TLB-A1C / Hgb A1C (Glycohemoglobin) (83036-A1C)  Labs Reviewed: Creat: 0.9 (05/12/2010)     Last Eye Exam: normal (10/19/2009) Reviewed HgBA1c results: 8.1 (05/12/2010)  6.8 (01/07/2010)  Complete Medication List: 1)  Vitamin D  50000 Unit Caps (Ergocalciferol) .Marland KitchenMarland KitchenMarland Kitchen 1 weekly 2)  Lantus Solostar 100 Unit/ml Soln (Insulin glargine) .... 20  units sq  daily 3)  Aspirin 81 Mg Tabs (Aspirin) .... Take 1 tablet by mouth once a day 4)  Preservision/lutein Caps (Multiple vitamins-minerals) .Marland Kitchen.. 1am and 1 pm 5)  Omega 3 1200mg   .... Take 1 tablet by mouth once a day 6)  Onetouch Ultra System W/device Kit (Blood glucose monitoring suppl) .... Use as driected 7)  Onetouch Ultra Test Strp (Glucose blood) .... Use three times a day as directed 8)  Janumet 50-1000 Mg Tabs (Sitagliptin-metformin hcl) .... One by mouth two times a day for diabetes 9)  Bd Pen Needles Locurto 31gx5/16  .... Use as directed 10)  Equate Arthritis Pain  11)  Livalo 2 Mg Tabs (Pitavastatin calcium) .... One by mouth once daily for cholesterol 12)  Tramadol Hcl 50 Mg Tabs (Tramadol hcl) .Marland Kitchen.. 1-2 by mouth qid as needed for pain  Hypertension Assessment/Plan:      The patient's hypertensive risk group is category C: Target organ damage and/or diabetes.  His calculated 10 year risk of coronary heart disease is 22 %.  Today's blood pressure is 82/50.  His blood pressure goal is < 130/80.  Patient Instructions: 1)  Please schedule a follow-up appointment in 2 weeks. 2)  Check your blood sugars regularly. If your readings are usually above  200 or below 70 you should contact our office. 3)  It is important that your Diabetic A1c level is checked every 3 months. 4)  See your eye doctor yearly to check for diabetic eye damage. 5)  Check your feet each night for sore areas, calluses or signs of infection. 6)  Oral Rehydration Solution: drink 1/2 ounce every 15 minutes. If tolerated afert 1 hour, drink 1 ounce every 15 minutes. As you can tolerate, keep adding 1/2 ounce every 15 minutes, up to a total of 2-4 ounces. Contact the office if unable to tolerate oral solution, if you keep vomiting, or you continue to have signs of dehydration. 7)  teh main problem with  gastroenteritis is dehydration. Drink plenty of fluids and take solids as you feel better. If you are unable to keep anything down and/or you show signs of dehydration(dry/cracked lips, lack of tears, not urinating, very sleepy), call our office.   Orders Added: 1)  Venipuncture [36415] 2)  TLB-B12 + Folate Pnl [82746_82607-B12/FOL] 3)  TLB-IBC Pnl (Iron/FE;Transferrin) [83550-IBC] 4)  TLB-BMP (Basic Metabolic Panel-BMET) [80048-METABOL] 5)  TLB-CBC Platelet - w/Differential [85025-CBCD] 6)  TLB-Hepatic/Liver Function Pnl [80076-HEPATIC] 7)  TLB-TSH (Thyroid Stimulating Hormone) [84443-TSH] 8)  TLB-Cardiac Panel [82550_82553-CARD] 9)  TLB-BNP (B-Natriuretic Peptide) [83880-BNPR] 10)  TLB-A1C / Hgb A1C (Glycohemoglobin) [83036-A1C] 11)  T-C diff by PCR [81755] 12)  T-Stool Giardia / Crypto- EIA [04540] 13)  T-Stool for O&P [87177-70555] 14)  TLB-PT (Protime) [85610-PTP] 15)  Est. Patient Level IV [98119]

## 2010-09-30 NOTE — Assessment & Plan Note (Signed)
Summary: 3 MO ROV / NWS  #   Vital Signs:  Patient profile:   77 year old male Height:      68 inches Weight:      202 pounds O2 Sat:      96 % on Room air Temp:     97.7 degrees F oral Pulse rate:   68 / minute Pulse rhythm:   regular Resp:     16 per minute BP sitting:   140 / 82  (left arm) Cuff size:   large  Vitals Entered By: Rock Nephew CMA (September 10, 2009 8:55 AM)  O2 Flow:  Room air  Primary Care Provider:  Etta Grandchild MD   History of Present Illness: He returns for f/up and is doing well. He reports AM FBS's  of 70-90.  Hypertension History:      He denies headache, chest pain, palpitations, dyspnea with exertion, orthopnea, PND, peripheral edema, visual symptoms, neurologic problems, syncope, and side effects from treatment.  He notes no problems with any antihypertensive medication side effects.        Positive major cardiovascular risk factors include male age 76 years old or older, diabetes, hyperlipidemia, and hypertension.  Negative major cardiovascular risk factors include negative family history for ischemic heart disease and non-tobacco-user status.        Further assessment for target organ damage reveals no history of ASHD, cardiac end-organ damage (CHF/LVH), stroke/TIA, peripheral vascular disease, renal insufficiency, or hypertensive retinopathy.    Lipid Management History:      Positive NCEP/ATP III risk factors include male age 36 years old or older, diabetes, and hypertension.  Negative NCEP/ATP III risk factors include no family history for ischemic heart disease, non-tobacco-user status, no ASHD (atherosclerotic heart disease), no prior stroke/TIA, no peripheral vascular disease, and no history of aortic aneurysm.        The patient states that he knows about the "Therapeutic Lifestyle Change" diet.  His compliance with the TLC diet is poor.  The patient expresses understanding of adjunctive measures for cholesterol lowering.  Adjunctive measures  started by the patient include aerobic exercise, fiber, limit alcohol consumpton, and weight reduction.      Preventive Screening-Counseling & Management  Alcohol-Tobacco     Alcohol drinks/day: 0     >5/day in last 3 mos: no     Alcohol Counseling: not indicated; patient does not drink     Smoking Status: never     Smoking Cessation Counseling: no  Current Medications (verified): 1)  Acarbose 100 Mg Tabs (Acarbose) .... Take 1 Tablet By Mouth Three Times A Day 2)  Vitamin D 60454 Unit Caps (Ergocalciferol) .Marland Kitchen.. 1 Weekly 3)  Lantus Solostar 100 Unit/ml Soln (Insulin Glargine) .... 25  Units Sq  Daily 4)  Aspirin 81 Mg Tabs (Aspirin) .... Take 1 Tablet By Mouth Once A Day 5)  Preservision/lutein  Caps (Multiple Vitamins-Minerals) .Marland Kitchen.. 1am and 1 Pm 6)  Omega 3 1200mg  .... Take 1 Tablet By Mouth Once A Day 7)  Onetouch Ultra System W/device Kit (Blood Glucose Monitoring Suppl) .... Use As Driected 8)  Onetouch Ultra Test  Strp (Glucose Blood) .... Use Three Times A Day As Directed 9)  Janumet 50-1000 Mg Tabs (Sitagliptin-Metformin Hcl) .... One By Mouth Two Times A Day For Diabetes 10)  Diovan 80 Mg Tabs (Valsartan) .... Once Daily 11)  Bd Pen Needles [place] 31gx5/16 .... Use As Directed  Allergies (verified): 1)  ! Percocet 2)  ! Penicillin  3)  ! Ace Inhibitors  Past History:  Past Medical History: Reviewed history from 11/10/2008 and no changes required. Skin cancer, hx of Diabetes mellitus, type II Osteoarthritis Vitamin D deficiency Hyperlipidemia Hypertension  Past Surgical History: Reviewed history from 11/10/2008 and no changes required. Inguinal herniorrhaphy Total knee replacement Cholecystectomy  Family History: Reviewed history from 11/10/2008 and no changes required. Family History Diabetes 1st degree relative  Social History: Reviewed history from 11/10/2008 and no changes required. Retired Married Never Smoked Alcohol use-no Drug use-no Regular  exercise-no  Review of Systems  The patient denies anorexia, weight loss, abdominal pain, melena, hematochezia, severe indigestion/heartburn, hematuria, suspicious skin lesions, abnormal bleeding, and enlarged lymph nodes.   GI:  Complains of gas; denies abdominal pain, bloody stools, change in bowel habits, loss of appetite, nausea, vomiting, vomiting blood, and yellowish skin color.  Physical Exam  General:  alert, well-developed, well-nourished, well-hydrated, normal appearance, healthy-appearing, cooperative to examination, good hygiene, and overweight-appearing.   Eyes:  No corneal or conjunctival inflammation noted. EOMI. Perrla. Funduscopic exam benign, without hemorrhages, exudates or papilledema. Vision grossly normal. Mouth:  Oral mucosa and oropharynx without lesions or exudates.  Teeth in good repair. Neck:  supple, full ROM, no masses, no JVD, no cervical lymphadenopathy, and no neck tenderness.   Lungs:  normal respiratory effort, no intercostal retractions, no accessory muscle use, normal breath sounds, no dullness, and no fremitus.   Heart:  Normal rate and regular rhythm. S1 and S2 normal without gallop, murmur, click, rub or other extra sounds. Abdomen:  soft, non-tender, normal bowel sounds, no distention, no masses, no guarding, no rigidity, no rebound tenderness, no hepatomegaly, and no splenomegaly.   Msk:  normal ROM, no joint tenderness, no joint swelling, and no joint warmth.   Pulses:  R and L carotid,radial,femoral,dorsalis pedis and posterior tibial pulses are full and equal bilaterally Extremities:  trace left pedal edema and trace right pedal edema.   Neurologic:  alert & oriented X3, cranial nerves II-XII intact, strength normal in all extremities, gait normal, and DTRs symmetrical and normal.   Skin:  turgor normal, color normal, no rashes, no suspicious lesions, no ecchymoses, no petechiae, and no purpura.   Cervical Nodes:  No lymphadenopathy noted Axillary  Nodes:  No palpable lymphadenopathy Psych:  Oriented X3, normally interactive, good eye contact, not anxious appearing, not depressed appearing, poor concentration, memory impairment, and judgment fair.    Diabetes Management Exam:    Foot Exam (with socks and/or shoes not present):       Sensory-Pinprick/Light touch:          Left medial foot (L-4): normal          Left dorsal foot (L-5): normal          Left lateral foot (S-1): normal          Right medial foot (L-4): normal          Right dorsal foot (L-5): normal          Right lateral foot (S-1): normal       Sensory-Monofilament:          Left foot: normal          Right foot: normal       Inspection:          Left foot: normal          Right foot: normal       Nails:          Left  foot: normal          Right foot: normal   Impression & Recommendations:  Problem # 1:  HYPERTENSION (ICD-401.9) Assessment Improved  His updated medication list for this problem includes:    Diovan 80 Mg Tabs (Valsartan) ..... Once daily  Orders: Venipuncture (16109) TLB-Lipid Panel (80061-LIPID) TLB-BMP (Basic Metabolic Panel-BMET) (80048-METABOL) TLB-CBC Platelet - w/Differential (85025-CBCD) TLB-Hepatic/Liver Function Pnl (80076-HEPATIC) TLB-A1C / Hgb A1C (Glycohemoglobin) (83036-A1C) TLB-Microalbumin/Creat Ratio, Urine (82043-MALB) TLB-Udip w/ Micro (81001-URINE) T-Vitamin D (25-Hydroxy) (60454-09811)  BP today: 140/82 Prior BP: 124/78 (06/11/2009)  10 Yr Risk Heart Disease: 27 % Prior 10 Yr Risk Heart Disease: 18 % (02/10/2009)  Labs Reviewed: K+: 4.5 (06/11/2009) Creat: : 0.9 (06/11/2009)   Chol: 135 (02/10/2009)   HDL: 42.30 (02/10/2009)   LDL: 80 (02/10/2009)   TG: 66.0 (02/10/2009)  Problem # 2:  HYPERLIPIDEMIA (ICD-272.4) Assessment: Unchanged  Orders: Venipuncture (91478) TLB-Lipid Panel (80061-LIPID) TLB-BMP (Basic Metabolic Panel-BMET) (80048-METABOL) TLB-CBC Platelet - w/Differential  (85025-CBCD) TLB-Hepatic/Liver Function Pnl (80076-HEPATIC) TLB-A1C / Hgb A1C (Glycohemoglobin) (83036-A1C) TLB-Microalbumin/Creat Ratio, Urine (82043-MALB) TLB-Udip w/ Micro (81001-URINE) T-Vitamin D (25-Hydroxy) (29562-13086)  Labs Reviewed: SGOT: 25 (06/11/2009)   SGPT: 24 (06/11/2009)  Lipid Goals: Chol Goal: 200 (02/10/2009)   HDL Goal: 40 (02/10/2009)   LDL Goal: 100 (02/10/2009)   TG Goal: 150 (02/10/2009)  10 Yr Risk Heart Disease: 27 % Prior 10 Yr Risk Heart Disease: 18 % (02/10/2009)   HDL:42.30 (02/10/2009), 40.1 (11/10/2008)  LDL:80 (02/10/2009), 81 (11/10/2008)  Chol:135 (02/10/2009), 133 (11/10/2008)  Trig:66.0 (02/10/2009), 59 (11/10/2008)  Problem # 3:  VITAMIN D DEFICIENCY (ICD-268.9) Assessment: Unchanged  Orders: Venipuncture (57846) TLB-Lipid Panel (80061-LIPID) TLB-BMP (Basic Metabolic Panel-BMET) (80048-METABOL) TLB-CBC Platelet - w/Differential (85025-CBCD) TLB-Hepatic/Liver Function Pnl (80076-HEPATIC) TLB-A1C / Hgb A1C (Glycohemoglobin) (83036-A1C) TLB-Microalbumin/Creat Ratio, Urine (82043-MALB) TLB-Udip w/ Micro (81001-URINE) T-Vitamin D (25-Hydroxy) (96295-28413)  Problem # 4:  DIABETES MELLITUS, TYPE II (ICD-250.00) Assessment: Improved will stop Acarbose due to gas. The following medications were removed from the medication list:    Acarbose 100 Mg Tabs (Acarbose) .Marland Kitchen... Take 1 tablet by mouth three times a day His updated medication list for this problem includes:    Lantus Solostar 100 Unit/ml Soln (Insulin glargine) .Marland Kitchen... 25  units sq  daily    Aspirin 81 Mg Tabs (Aspirin) .Marland Kitchen... Take 1 tablet by mouth once a day    Janumet 50-1000 Mg Tabs (Sitagliptin-metformin hcl) ..... One by mouth two times a day for diabetes    Diovan 80 Mg Tabs (Valsartan) ..... Once daily  Orders: Venipuncture (24401) TLB-Lipid Panel (80061-LIPID) TLB-BMP (Basic Metabolic Panel-BMET) (80048-METABOL) TLB-CBC Platelet - w/Differential (85025-CBCD) TLB-Hepatic/Liver  Function Pnl (80076-HEPATIC) TLB-A1C / Hgb A1C (Glycohemoglobin) (83036-A1C) TLB-Microalbumin/Creat Ratio, Urine (82043-MALB) TLB-Udip w/ Micro (81001-URINE) T-Vitamin D (25-Hydroxy) (02725-36644)  Labs Reviewed: Creat: 0.9 (06/11/2009)     Last Eye Exam: normal (11/10/2008) Reviewed HgBA1c results: 7.5 (06/11/2009)  8.5 (05/14/2009)  Problem # 5:  MEMORY LOSS (ICD-780.93) Assessment: Unchanged  Complete Medication List: 1)  Vitamin D 03474 Unit Caps (Ergocalciferol) .Marland KitchenMarland KitchenMarland Kitchen 1 weekly 2)  Lantus Solostar 100 Unit/ml Soln (Insulin glargine) .... 25  units sq  daily 3)  Aspirin 81 Mg Tabs (Aspirin) .... Take 1 tablet by mouth once a day 4)  Preservision/lutein Caps (Multiple vitamins-minerals) .Marland Kitchen.. 1am and 1 pm 5)  Omega 3 1200mg   .... Take 1 tablet by mouth once a day 6)  Onetouch Ultra System W/device Kit (Blood glucose monitoring suppl) .... Use as driected 7)  Onetouch Ultra Test Strp (Glucose  blood) .... Use three times a day as directed 8)  Janumet 50-1000 Mg Tabs (Sitagliptin-metformin hcl) .... One by mouth two times a day for diabetes 9)  Diovan 80 Mg Tabs (Valsartan) .... Once daily 10)  Bd Pen Needles [place] 31gx5/16  .... Use as directed  Hypertension Assessment/Plan:      The patient's hypertensive risk group is category C: Target organ damage and/or diabetes.  His calculated 10 year risk of coronary heart disease is 27 %.  Today's blood pressure is 140/82.  His blood pressure goal is < 130/80.  Lipid Assessment/Plan:      Based on NCEP/ATP III, the patient's risk factor category is "history of diabetes".  The patient's lipid goals are as follows: Total cholesterol goal is 200; LDL cholesterol goal is 100; HDL cholesterol goal is 40; Triglyceride goal is 150.    Patient Instructions: 1)  Please schedule a follow-up appointment in 4 months. 2)  It is important that you exercise regularly at least 20 minutes 5 times a week. If you develop chest pain, have severe difficulty  breathing, or feel very tired , stop exercising immediately and seek medical attention. 3)  You need to lose weight. Consider a lower calorie diet and regular exercise.  4)  Check your blood sugars regularly. If your readings are usually above 200  or below 70 you should contact our office. 5)  It is important that your Diabetic A1c level is checked every 3 months. 6)  See your eye doctor yearly to check for diabetic eye damage. 7)  Check your feet each night for sore areas, calluses or signs of infection. 8)  Check your Blood Pressure regularly. If it is above 130/80: you should make an appointment. Prescriptions: ONETOUCH ULTRA SYSTEM W/DEVICE KIT (BLOOD GLUCOSE MONITORING SUPPL) Use as driected  #1 x 0   Entered and Authorized by:   Etta Grandchild MD   Signed by:   Etta Grandchild MD on 09/10/2009   Method used:   Printed then faxed to ...       [facility]  [address] # [id number]* (retail)       [address]       [place], [place]  [address]       Ph: [phone] or [phone]       Fax: [phone]   RxID:   [id number] [place] TEST  STRP (GLUCOSE BLOOD) Use three times a day as directed  #90 x 11   Entered and Authorized by:   Etta Grandchild MD   Signed by:   Etta Grandchild MD on 09/10/2009   Method used:   Electronically to        [facility]  [address] # [id number]* (retail)       [address]       [place], [place]  [address]       Ph: [phone] or [phone]       Fax: [phone]   RxID:   [id number] BD PEN NEEDLES [place] 31GX5/16 Use as directed  #100 x 11   Entered and Authorized by:   Etta Grandchild MD   Signed by:   Etta Grandchild MD on 09/10/2009   Method used:   Printed then faxed to ...       [facility]  [address] # [id number]* (retail)       [address]  [place], [place]  [address]       Ph: [phone] or [phone]       Fax: [phone]   RxID:   [id number] DIOVAN 80 MG  TABS (VALSARTAN) once daily  #30 x 11   Entered and Authorized by:   Etta Grandchild MD   Signed by:   Etta Grandchild MD on 09/10/2009   Method used:   Electronically to        [facility]  [address] [id number]* (retail)       [address]       [place], [place]  [address]       Ph: [phone] or [phone]       Fax: [phone]   RxID:   [id number] JANUMET 50-1000 MG TABS (SITAGLIPTIN-METFORMIN HCL) One by mouth two times a day for diabetes  #60 x 11   Entered and Authorized by:   Etta Grandchild MD   Signed by:   Etta Grandchild MD on 09/10/2009   Method used:   Electronically to        [facility]  [address] [id number]* (retail)       [address]       [place], [place]  [address]       Ph: [phone] or [phone]       Fax: [phone]   RxID:   [id number] LANTUS SOLOSTAR 100 UNIT/ML SOLN (INSULIN GLARGINE) 25  units SQ  daily  #1 month x 11   Entered and Authorized by:   Etta Grandchild MD   Signed by:   Etta Grandchild MD on 09/10/2009   Method used:   Electronically to        [facility]  [address] [id number]* (retail)       [address]       [place], [place]  [address]       Ph: [phone] or [phone]       Fax: [phone]   RxID:   [id number] VITAMIN D 50000 UNIT CAPS (ERGOCALCIFEROL) 1 weekly  #4 x 11   Entered and Authorized by:   Etta Grandchild MD   Signed by:   Etta Grandchild MD on 09/10/2009   Method used:   Electronically to        [facility]  [address] # [id number]* (retail)       [address]       [place], [place]  [address]       Ph: [phone] or [phone]       Fax: [phone]   RxID:   [id number]

## 2010-09-30 NOTE — Progress Notes (Signed)
Summary: PT UPDATE  Phone Note From Other Clinic   Caller: Baruch Gouty - PT [facility] Summary of Call: Therapist from [facility] called w/FYI UPDATE for MD. Bp left arm sitting 90/50 Right arm sitting 98/48. Reclined 108/58 Left 112/56 Right. Pt tolerated some upright activity.  Initial call taken by: Lamar Sprinkles, CMA,  August 04, 2010 3:21 PM

## 2010-09-30 NOTE — Letter (Signed)
Summary: Results Follow-up Letter  [facility]  [address] [place], [place] [address]   Phone: [phone]  Fax: [phone]    09/11/2009  [address] [place], [place]  [address]  Dear Mr. Glassburn,   The following are the results of your recent test(s):  Test     Result     Vitmain D level   slightly low at 29   _________________________________________________________  Please call for an appointment as directed _________________________________________________________ _________________________________________________________ _________________________________________________________  Sincerely,  Sanda Linger MD  [facility]           Appended Document: Results Follow-up Letter    Diabetes Management Exam:    Eye Exam:       Eye Exam done elsewhere          Date: 10/19/2009          Results: normal          Done by: Harriette Bouillon

## 2010-09-30 NOTE — Assessment & Plan Note (Signed)
Summary: f/u appt/cd   Vital Signs:  Patient profile:   77 year old male Height:      68 inches Weight:      201.25 pounds BMI:     30.71 O2 Sat:      95 % on Room air Temp:     97.5 degrees F oral Pulse rate:   67 / minute Pulse rhythm:   regular Resp:     16 per minute BP sitting:   138 / 70  (left arm) Cuff size:   large  Vitals Entered By: Rock Nephew CMA (May 12, 2010 9:59 AM)  Nutrition Counseling: Patient's BMI is greater than 25 and therefore counseled on weight management options.  O2 Flow:  Room air  Primary Care Provider:  Etta Grandchild MD   History of Present Illness:  Follow-Up Visit      This is an 77 year old man who presents for Follow-up visit.  The patient denies chest pain, palpitations, dizziness, syncope, low blood sugar symptoms, high blood sugar symptoms, edema, SOB, DOE, PND, and orthopnea.  Since the last visit the patient notes no new problems or concerns.  The patient reports taking meds as prescribed, monitoring BP, monitoring blood sugars, and dietary compliance.  When questioned about possible medication side effects, the patient notes none.    Lipid Management History:      Positive NCEP/ATP III risk factors include male age 26 years old or older, diabetes, and hypertension.  Negative NCEP/ATP III risk factors include no family history for ischemic heart disease, non-tobacco-user status, no ASHD (atherosclerotic heart disease), no prior stroke/TIA, no peripheral vascular disease, and no history of aortic aneurysm.        The patient states that he knows about the "Therapeutic Lifestyle Change" diet.  His compliance with the TLC diet is poor.  The patient expresses understanding of adjunctive measures for cholesterol lowering.  Adjunctive measures started by the patient include aerobic exercise, fiber, ASA, folic acid, limit alcohol consumpton, and weight reduction.  He notes side effects from his lipid-lowering medication.  Comments include:  fatigue.  The patient denies any symptoms to suggest myopathy or liver disease.     Preventive Screening-Counseling & Management  Alcohol-Tobacco     Alcohol drinks/day: 0     >5/day in last 3 mos: no     Alcohol Counseling: not indicated; patient does not drink     Smoking Status: never     Smoking Cessation Counseling: no     Tobacco Counseling: not indicated; no tobacco use  Hep-HIV-STD-Contraception     Hepatitis Risk: no risk noted     HIV Risk: no risk noted     STD Risk: no risk noted      Sexual History:  currently monogamous.        Drug Use:  never and no.        Blood Transfusions:  yes and after 2001.    Clinical Review Panels:  Immunizations   Last Tetanus Booster:  given (08/29/2002)   Last Flu Vaccine:  Fluvax 3+ (05/12/2010)   Last Pneumovax:  given (08/29/2006)  Lipid Management   Cholesterol:  163 (01/07/2010)   LDL (bad choesterol):  109 (01/07/2010)   HDL (good cholesterol):  44.60 (01/07/2010)  Diabetes Management   HgBA1C:  6.8 (01/07/2010)   Creatinine:  0.8 (01/07/2010)   Last Dilated Eye Exam:  normal (10/19/2009)   Last Foot Exam:  yes (05/12/2010)   Last Flu Vaccine:  Fluvax  3+ (05/12/2010)   Last Pneumovax:  given (08/29/2006)  CBC   WBC:  8.5 (01/07/2010)   RBC:  4.99 (01/07/2010)   Hgb:  14.7 (01/07/2010)   Hct:  43.0 (01/07/2010)   Platelets:  234.0 (01/07/2010)   MCV  86.3 (01/07/2010)   MCHC  34.2 (01/07/2010)   RDW  14.7 (01/07/2010)   PMN:  52.2 (01/07/2010)   Lymphs:  36.5 (01/07/2010)   Monos:  8.1 (01/07/2010)   Eosinophils:  2.7 (01/07/2010)   Basophil:  0.5 (01/07/2010)  Complete Metabolic Panel   Glucose:  74 (01/07/2010)   Sodium:  143 (01/07/2010)   Potassium:  4.4 (01/07/2010)   Chloride:  106 (01/07/2010)   CO2:  29 (01/07/2010)   BUN:  19 (01/07/2010)   Creatinine:  0.8 (01/07/2010)   Albumin:  3.9 (01/07/2010)   Total Protein:  6.3 (01/07/2010)   Calcium:  8.8 (01/07/2010)   Total Bili:  1.0  (01/07/2010)   Alk Phos:  84 (01/07/2010)   SGPT (ALT):  21 (01/07/2010)   SGOT (AST):  25 (01/07/2010)   Medications Prior to Update: 1)  Vitamin D 81191 Unit Caps (Ergocalciferol) .Marland Kitchen.. 1 Weekly 2)  Lantus Solostar 100 Unit/ml Soln (Insulin Glargine) .... 25  Units Sq  Daily 3)  Aspirin 81 Mg Tabs (Aspirin) .... Take 1 Tablet By Mouth Once A Day 4)  Preservision/lutein  Caps (Multiple Vitamins-Minerals) .Marland Kitchen.. 1am and 1 Pm 5)  Omega 3 1200mg  .... Take 1 Tablet By Mouth Once A Day 6)  Onetouch Ultra System W/device Kit (Blood Glucose Monitoring Suppl) .... Use As Driected 7)  Onetouch Ultra Test  Strp (Glucose Blood) .... Use Three Times A Day As Directed 8)  Janumet 50-1000 Mg Tabs (Sitagliptin-Metformin Hcl) .... One By Mouth Two Times A Day For Diabetes 9)  Diovan 80 Mg Tabs (Valsartan) .... Once Daily 10)  Bd Pen Needles [place] 31gx5/16 .... Use As Directed 11)  Lipitor 40 Mg Tabs (Atorvastatin Calcium) .... Once Daily  Current Medications (verified): 1)  Vitamin D 47829 Unit Caps (Ergocalciferol) .Marland Kitchen.. 1 Weekly 2)  Lantus Solostar 100 Unit/ml Soln (Insulin Glargine) .... 20  Units Sq  Daily 3)  Aspirin 81 Mg Tabs (Aspirin) .... Take 1 Tablet By Mouth Once A Day 4)  Preservision/lutein  Caps (Multiple Vitamins-Minerals) .Marland Kitchen.. 1am and 1 Pm 5)  Omega 3 1200mg  .... Take 1 Tablet By Mouth Once A Day 6)  Onetouch Ultra System W/device Kit (Blood Glucose Monitoring Suppl) .... Use As Driected 7)  Onetouch Ultra Test  Strp (Glucose Blood) .... Use Three Times A Day As Directed 8)  Janumet 50-1000 Mg Tabs (Sitagliptin-Metformin Hcl) .... One By Mouth Two Times A Day For Diabetes 9)  Diovan 80 Mg Tabs (Valsartan) .... Once Daily 10)  Bd Pen Needles [place] 31gx5/16 .... Use As Directed 11)  Equate Arthritis Pain 12)  Livalo 2 Mg Tabs (Pitavastatin Calcium) .... One By Mouth Once Daily For Cholesterol  Allergies (verified): 1)  ! Percocet 2)  ! Penicillin 3)  ! Ace Inhibitors  Past  History:  Past Medical History: Last updated: 11/10/2008 Skin cancer, hx of Diabetes mellitus, type II Osteoarthritis Vitamin D deficiency Hyperlipidemia Hypertension  Past Surgical History: Last updated: 11/10/2008 Inguinal herniorrhaphy Total knee replacement Cholecystectomy  Family History: Last updated: 11/10/2008 Family History Diabetes 1st degree relative  Social History: Last updated: 11/10/2008 Retired Married Never Smoked Alcohol use-no Drug use-no Regular exercise-no  Risk Factors: Alcohol Use: 0 (05/12/2010) >5 drinks/d w/in last 3 months:  no (05/12/2010) Exercise: no (11/10/2008)  Risk Factors: Smoking Status: never (05/12/2010)  Family History: Reviewed history from 11/10/2008 and no changes required. Family History Diabetes 1st degree relative  Social History: Reviewed history from 11/10/2008 and no changes required. Retired Married Never Smoked Alcohol use-no Drug use-no Regular exercise-no  Review of Systems General:  Complains of fatigue; denies chills, fever, loss of appetite, malaise, sleep disorder, sweats, weakness, and weight loss. Endo:  Denies cold intolerance, excessive hunger, excessive thirst, excessive urination, heat intolerance, polyuria, and weight change.  Physical Exam  General:  alert, well-developed, well-nourished, well-hydrated, normal appearance, healthy-appearing, cooperative to examination, good hygiene, and overweight-appearing.   Head:  normocephalic, atraumatic, no abnormalities observed, and no abnormalities palpated.   Mouth:  Oral mucosa and oropharynx without lesions or exudates.  Teeth in good repair. Neck:  supple, full ROM, no masses, no JVD, no cervical lymphadenopathy, and no neck tenderness.   Lungs:  normal respiratory effort, no intercostal retractions, no accessory muscle use, normal breath sounds, no dullness, and no fremitus.   Heart:  Normal rate and regular rhythm. S1 and S2 normal without  gallop, murmur, click, rub or other extra sounds. Abdomen:  soft, non-tender, normal bowel sounds, no distention, no masses, no guarding, no rigidity, no rebound tenderness, no hepatomegaly, and no splenomegaly.   Msk:  normal ROM, no joint tenderness, no joint swelling, and no joint warmth.   Pulses:  R and L carotid,radial,femoral,dorsalis pedis and posterior tibial pulses are full and equal bilaterally Extremities:  1+ left pedal edema and 1+ right pedal edema.   Neurologic:  alert & oriented X3, cranial nerves II-XII intact, strength normal in all extremities, gait normal, and DTRs symmetrical and normal.   Skin:  turgor normal, color normal, no rashes, no suspicious lesions, no ecchymoses, no petechiae, and no purpura.   Cervical Nodes:  No lymphadenopathy noted Psych:  Oriented X3, normally interactive, good eye contact, not anxious appearing, not depressed appearing, poor concentration, memory impairment, and judgment fair.    Diabetes Management Exam:    Foot Exam (with socks and/or shoes not present):       Sensory-Pinprick/Light touch:          Left medial foot (L-4): normal          Left dorsal foot (L-5): normal          Left lateral foot (S-1): normal          Right medial foot (L-4): normal          Right dorsal foot (L-5): normal          Right lateral foot (S-1): normal       Sensory-Monofilament:          Left foot: normal          Right foot: normal       Inspection:          Left foot: normal          Right foot: normal       Nails:          Left foot: normal          Right foot: normal   Impression & Recommendations:  Problem # 1:  HYPERTENSION (ICD-401.9) Assessment Unchanged  His updated medication list for this problem includes:    Diovan 80 Mg Tabs (Valsartan) ..... Once daily  Orders: Venipuncture ([id number]) TLB-Lipid Panel (80061-LIPID) TLB-BMP (Basic Metabolic Panel-BMET) (80048-METABOL) TLB-CBC Platelet - w/Differential  (85025-CBCD) TLB-Hepatic/Liver Function Pnl (  80076-HEPATIC) TLB-TSH (Thyroid Stimulating Hormone) (84443-TSH) TLB-CK Total Only(Creatine Kinase/CPK) (82550-CK) TLB-A1C / Hgb A1C (Glycohemoglobin) (83036-A1C)  BP today: 138/70 Prior BP: 122/68 (01/07/2010)  10 Yr Risk Heart Disease: 40 % Prior 10 Yr Risk Heart Disease: 33 % (01/07/2010)  Labs Reviewed: K+: 4.4 (01/07/2010) Creat: : 0.8 (01/07/2010)   Chol: 163 (01/07/2010)   HDL: 44.60 (01/07/2010)   LDL: 109 (01/07/2010)   TG: 49.0 (01/07/2010)  Problem # 2:  DIABETES MELLITUS, TYPE II (ICD-250.00) Assessment: Unchanged  His updated medication list for this problem includes:    Lantus Solostar 100 Unit/ml Soln (Insulin glargine) .Marland Kitchen... 20  units sq  daily    Aspirin 81 Mg Tabs (Aspirin) .Marland Kitchen... Take 1 tablet by mouth once a day    Janumet 50-1000 Mg Tabs (Sitagliptin-metformin hcl) ..... One by mouth two times a day for diabetes    Diovan 80 Mg Tabs (Valsartan) ..... Once daily  Orders: Venipuncture ([id number]) TLB-Lipid Panel (80061-LIPID) TLB-BMP (Basic Metabolic Panel-BMET) (80048-METABOL) TLB-CBC Platelet - w/Differential (85025-CBCD) TLB-Hepatic/Liver Function Pnl (80076-HEPATIC) TLB-TSH (Thyroid Stimulating Hormone) (84443-TSH) TLB-CK Total Only(Creatine Kinase/CPK) (82550-CK) TLB-A1C / Hgb A1C (Glycohemoglobin) (83036-A1C)  Labs Reviewed: Creat: 0.8 (01/07/2010)     Last Eye Exam: normal (10/19/2009) Reviewed HgBA1c results: 6.8 (01/07/2010)  6.8 (09/10/2009)  Problem # 3:  HYPERLIPIDEMIA (ICD-272.4) Assessment: Unchanged  The following medications were removed from the medication list:    Lipitor 40 Mg Tabs (Atorvastatin calcium) ..... Once daily His updated medication list for this problem includes:    Livalo 2 Mg Tabs (Pitavastatin calcium) ..... One by mouth once daily for cholesterol  Orders: Venipuncture ([id number]) TLB-Lipid Panel (80061-LIPID) TLB-BMP (Basic Metabolic Panel-BMET) (80048-METABOL) TLB-CBC  Platelet - w/Differential (85025-CBCD) TLB-Hepatic/Liver Function Pnl (80076-HEPATIC) TLB-TSH (Thyroid Stimulating Hormone) (84443-TSH) TLB-CK Total Only(Creatine Kinase/CPK) (82550-CK) TLB-A1C / Hgb A1C (Glycohemoglobin) (83036-A1C)  Labs Reviewed: SGOT: 25 (01/07/2010)   SGPT: 21 (01/07/2010)  Lipid Goals: Chol Goal: 200 (02/10/2009)   HDL Goal: 40 (02/10/2009)   LDL Goal: 100 (02/10/2009)   TG Goal: 150 (02/10/2009)  10 Yr Risk Heart Disease: 40 % Prior 10 Yr Risk Heart Disease: 33 % (01/07/2010)   HDL:44.60 (01/07/2010), 43.50 (09/10/2009)  LDL:109 (01/07/2010), 129 (09/10/2009)  Chol:163 (01/07/2010), 183 (09/10/2009)  Trig:49.0 (01/07/2010), 53.0 (09/10/2009)  Complete Medication List: 1)  Vitamin D 95638 Unit Caps (Ergocalciferol) .Marland KitchenMarland KitchenMarland Kitchen 1 weekly 2)  Lantus Solostar 100 Unit/ml Soln (Insulin glargine) .... 20  units sq  daily 3)  Aspirin 81 Mg Tabs (Aspirin) .... Take 1 tablet by mouth once a day 4)  Preservision/lutein Caps (Multiple vitamins-minerals) .Marland Kitchen.. 1am and 1 pm 5)  Omega 3 1200mg   .... Take 1 tablet by mouth once a day 6)  Onetouch Ultra System W/device Kit (Blood glucose monitoring suppl) .... Use as driected 7)  Onetouch Ultra Test Strp (Glucose blood) .... Use three times a day as directed 8)  Janumet 50-1000 Mg Tabs (Sitagliptin-metformin hcl) .... One by mouth two times a day for diabetes 9)  Diovan 80 Mg Tabs (Valsartan) .... Once daily 10)  Bd Pen Needles [place] 31gx5/16  .... Use as directed 11)  Equate Arthritis Pain  12)  Livalo 2 Mg Tabs (Pitavastatin calcium) .... One by mouth once daily for cholesterol  Other Orders: Flu Vaccine 52yrs + MEDICARE PATIENTS (V5643) Administration Flu vaccine - MCR (G0008)  Lipid Assessment/Plan:      Based on NCEP/ATP III, the patient's risk factor category is "history of diabetes".  The patient's lipid goals are as follows: Total cholesterol goal is  200; LDL cholesterol goal is 100; HDL cholesterol goal is 40; Triglyceride  goal is 150.   Flu Vaccine Consent Questions     Do you have a history of severe allergic reactions to this vaccine? no    Any prior history of allergic reactions to egg and/or gelatin? no    Do you have a sensitivity to the preservative Thimersol? no    Do you have a past history of Guillan-Barre Syndrome? no    Do you currently have an acute febrile illness? no    Have you ever had a severe reaction to latex? no    Vaccine information given and explained to patient? yes    Are you currently pregnant? no    Lot Number:[id number]   Exp Date:02/26/2011   Site Given  Left Deltoid IMion Flu vaccine - MCR (E4540)  Patient Instructions: 1)  Please schedule a follow-up appointment in 3 months. 2)  It is important that you exercise regularly at least 20 minutes 5 times a week. If you develop chest pain, have severe difficulty breathing, or feel very tired , stop exercising immediately and seek medical attention. 3)  You need to lose weight. Consider a lower calorie diet and regular exercise.  4)  Check your blood sugars regularly. If your readings are usually above 200 or below 70 you should contact our office. 5)  It is important that your Diabetic A1c level is checked every 3 months. 6)  See your eye doctor yearly to check for diabetic eye damage. 7)  Check your feet each night for sore areas, calluses or signs of infection. 8)  Check your Blood Pressure regularly. If it is above 130/80: you should make an appointment. Prescriptions: LIVALO 2 MG TABS (PITAVASTATIN CALCIUM) One by mouth once daily for cholesterol  #42 x 0   Entered and Authorized by:   Etta Grandchild MD   Signed by:   Etta Grandchild MD on 05/12/2010   Method used:   Samples Given   RxID:   [id number]    .[email or web address]

## 2010-09-30 NOTE — Assessment & Plan Note (Signed)
Summary: b-12 injection-lb  Nurse Visit   Allergies: 1)  ! Percocet 2)  ! Penicillin 3)  ! Ace Inhibitors  Medication Administration  Injection # 1:    Medication: Vit B12 1000 mcg    Diagnosis: B12 DEFICIENCY (ICD-266.2)    Route: IM    Site: L deltoid    Exp Date: 03/29/2012    Lot #: [id number]    Mfr: [employer] Regent    Patient tolerated injection without complications    Given by: Lamar Sprinkles, CMA (August 13, 2010 10:41 AM)  Orders Added: 1)  Admin of Therapeutic Inj  intramuscular or subcutaneous [96372] 2)  Vit B12 1000 mcg [J3420]   Medication Administration  Injection # 1:    Medication: Vit B12 1000 mcg    Diagnosis: B12 DEFICIENCY (ICD-266.2)    Route: IM    Site: L deltoid    Exp Date: 03/29/2012    Lot #: [id number]    Mfr: [employer] Regent    Patient tolerated injection without complications    Given by: Lamar Sprinkles, CMA (August 13, 2010 10:41 AM)  Orders Added: 1)  Admin of Therapeutic Inj  intramuscular or subcutaneous [96372] 2)  Vit B12 1000 mcg [J3420]

## 2010-09-30 NOTE — Assessment & Plan Note (Signed)
Summary: 4 MO ROV /NWS  #   Vital Signs:  Patient profile:   77 year old male Height:      68 inches Weight:      203 pounds BMI:     30.98 O2 Sat:      98 % on Room air Temp:     97.3 degrees F oral Pulse rate:   64 / minute Pulse rhythm:   regular Resp:     16 per minute BP sitting:   122 / 68  (left arm) Cuff size:   large  Vitals Entered By: Rock Nephew CMA (Jan 07, 2010 10:03 AM)  Nutrition Counseling: Patient's BMI is greater than 25 and therefore counseled on weight management options.  O2 Flow:  Room air CC: follow-up visit, Lipid Management Is Patient Diabetic? Yes Did you bring your meter with you today? No Pain Assessment Patient in pain? no        Primary Care Provider:  Etta Grandchild MD  CC:  follow-up visit and Lipid Management.  History of Present Illness:  Follow-Up Visit      This is an 77 year old man who presents for Follow-up visit.  The patient complains of edema, but denies chest pain, palpitations, dizziness, syncope, low blood sugar symptoms, high blood sugar symptoms, SOB, DOE, PND, and orthopnea.  Since the last visit the patient notes no new problems or concerns.  The patient reports taking meds as prescribed, monitoring BP, monitoring blood sugars, and dietary noncompliance.  When questioned about possible medication side effects, the patient notes none.    Lipid Management History:      Positive NCEP/ATP III risk factors include male age 75 years old or older, diabetes, and hypertension.  Negative NCEP/ATP III risk factors include no family history for ischemic heart disease, non-tobacco-user status, no ASHD (atherosclerotic heart disease), no prior stroke/TIA, no peripheral vascular disease, and no history of aortic aneurysm.        The patient states that he knows about the "Therapeutic Lifestyle Change" diet.  His compliance with the TLC diet is poor.  The patient expresses understanding of adjunctive measures for cholesterol lowering.   Adjunctive measures started by the patient include aerobic exercise, fiber, omega-3 supplements, limit alcohol consumpton, and weight reduction.  He expresses no side effects from his lipid-lowering medication.  The patient denies any symptoms to suggest myopathy or liver disease.     Preventive Screening-Counseling & Management  Alcohol-Tobacco     Alcohol drinks/day: 0     >5/day in last 3 mos: no     Alcohol Counseling: not indicated; patient does not drink     Smoking Status: never     Smoking Cessation Counseling: no  Hep-HIV-STD-Contraception     Hepatitis Risk: no risk noted     HIV Risk: no risk noted     STD Risk: no risk noted      Sexual History:  currently monogamous.        Drug Use:  never and no.        Blood Transfusions:  yes and after 2001.    Clinical Review Panels:  Immunizations   Last Tetanus Booster:  given (08/29/2002)   Last Flu Vaccine:  given (08/30/2007)   Last Pneumovax:  given (08/29/2006)  Lipid Management   Cholesterol:  183 (09/10/2009)   LDL (bad choesterol):  129 (09/10/2009)   HDL (good cholesterol):  43.50 (09/10/2009)  Diabetes Management   HgBA1C:  6.8 (09/10/2009)  Creatinine:  1.0 (09/10/2009)   Last Dilated Eye Exam:  normal (10/19/2009)   Last Foot Exam:  yes (01/07/2010)   Last Flu Vaccine:  given (08/30/2007)   Last Pneumovax:  given (08/29/2006)  CBC   WBC:  7.7 (09/10/2009)   RBC:  5.04 (09/10/2009)   Hgb:  14.5 (09/10/2009)   Hct:  44.2 (09/10/2009)   Platelets:  251.0 (09/10/2009)   MCV  87.8 (09/10/2009)   MCHC  32.9 (09/10/2009)   RDW  13.9 (09/10/2009)   PMN:  52.5 (09/10/2009)   Lymphs:  34.6 (09/10/2009)   Monos:  9.4 (09/10/2009)   Eosinophils:  2.8 (09/10/2009)   Basophil:  0.7 (09/10/2009)  Complete Metabolic Panel   Glucose:  92 (09/10/2009)   Sodium:  138 (09/10/2009)   Potassium:  4.5 (09/10/2009)   Chloride:  104 (09/10/2009)   CO2:  28 (09/10/2009)   BUN:  16 (09/10/2009)   Creatinine:  1.0  (09/10/2009)   Albumin:  3.9 (09/10/2009)   Total Protein:  6.4 (09/10/2009)   Calcium:  8.9 (09/10/2009)   Total Bili:  0.8 (09/10/2009)   Alk Phos:  87 (09/10/2009)   SGPT (ALT):  30 (09/10/2009)   SGOT (AST):  30 (09/10/2009)   Medications Prior to Update: 1)  Vitamin D 09811 Unit Caps (Ergocalciferol) .Marland Kitchen.. 1 Weekly 2)  Lantus Solostar 100 Unit/ml Soln (Insulin Glargine) .... 25  Units Sq  Daily 3)  Aspirin 81 Mg Tabs (Aspirin) .... Take 1 Tablet By Mouth Once A Day 4)  Preservision/lutein  Caps (Multiple Vitamins-Minerals) .Marland Kitchen.. 1am and 1 Pm 5)  Omega 3 1200mg  .... Take 1 Tablet By Mouth Once A Day 6)  Onetouch Ultra System W/device Kit (Blood Glucose Monitoring Suppl) .... Use As Driected 7)  Onetouch Ultra Test  Strp (Glucose Blood) .... Use Three Times A Day As Directed 8)  Janumet 50-1000 Mg Tabs (Sitagliptin-Metformin Hcl) .... One By Mouth Two Times A Day For Diabetes 9)  Diovan 80 Mg Tabs (Valsartan) .... Once Daily 10)  Bd Pen Needles [place] 31gx5/16 .... Use As Directed  Current Medications (verified): 1)  Vitamin D 91478 Unit Caps (Ergocalciferol) .Marland Kitchen.. 1 Weekly 2)  Lantus Solostar 100 Unit/ml Soln (Insulin Glargine) .... 25  Units Sq  Daily 3)  Aspirin 81 Mg Tabs (Aspirin) .... Take 1 Tablet By Mouth Once A Day 4)  Preservision/lutein  Caps (Multiple Vitamins-Minerals) .Marland Kitchen.. 1am and 1 Pm 5)  Omega 3 1200mg  .... Take 1 Tablet By Mouth Once A Day 6)  Onetouch Ultra System W/device Kit (Blood Glucose Monitoring Suppl) .... Use As Driected 7)  Onetouch Ultra Test  Strp (Glucose Blood) .... Use Three Times A Day As Directed 8)  Janumet 50-1000 Mg Tabs (Sitagliptin-Metformin Hcl) .... One By Mouth Two Times A Day For Diabetes 9)  Diovan 80 Mg Tabs (Valsartan) .... Once Daily 10)  Bd Pen Needles [place] 31gx5/16 .... Use As Directed  Allergies (verified): 1)  ! Percocet 2)  ! Penicillin 3)  ! Ace Inhibitors  Past History:  Past Medical History: Reviewed history from  11/10/2008 and no changes required. Skin cancer, hx of Diabetes mellitus, type II Osteoarthritis Vitamin D deficiency Hyperlipidemia Hypertension  Past Surgical History: Reviewed history from 11/10/2008 and no changes required. Inguinal herniorrhaphy Total knee replacement Cholecystectomy  Family History: Reviewed history from 11/10/2008 and no changes required. Family History Diabetes 1st degree relative  Social History: Reviewed history from 11/10/2008 and no changes required. Retired Married Never Smoked Alcohol use-no Drug use-no  Regular exercise-no  Review of Systems       The patient complains of weight gain.  The patient denies anorexia, fever, weight loss, chest pain, syncope, dyspnea on exertion, prolonged cough, headaches, hemoptysis, abdominal pain, hematuria, difficulty walking, and depression.   Endo:  Denies cold intolerance, excessive hunger, excessive thirst, excessive urination, heat intolerance, polyuria, and weight change.  Physical Exam  General:  alert, well-developed, well-nourished, well-hydrated, normal appearance, healthy-appearing, cooperative to examination, good hygiene, and overweight-appearing.   Head:  normocephalic, atraumatic, no abnormalities observed, and no abnormalities palpated.   Mouth:  Oral mucosa and oropharynx without lesions or exudates.  Teeth in good repair. Neck:  supple, full ROM, no masses, no JVD, no cervical lymphadenopathy, and no neck tenderness.   Lungs:  normal respiratory effort, no intercostal retractions, no accessory muscle use, normal breath sounds, no dullness, and no fremitus.   Heart:  Normal rate and regular rhythm. S1 and S2 normal without gallop, murmur, click, rub or other extra sounds. Abdomen:  soft, non-tender, normal bowel sounds, no distention, no masses, no guarding, no rigidity, no rebound tenderness, no hepatomegaly, and no splenomegaly.   Msk:  normal ROM, no joint tenderness, no joint swelling, and  no joint warmth.   Pulses:  R and L carotid,radial,femoral,dorsalis pedis and posterior tibial pulses are full and equal bilaterally Extremities:  1+ left pedal edema and 1+ right pedal edema.   Neurologic:  alert & oriented X3, cranial nerves II-XII intact, strength normal in all extremities, gait normal, and DTRs symmetrical and normal.   Skin:  turgor normal, color normal, no rashes, no suspicious lesions, no ecchymoses, no petechiae, and no purpura.   Cervical Nodes:  No lymphadenopathy noted Axillary Nodes:  No palpable lymphadenopathy Psych:  Oriented X3, normally interactive, good eye contact, not anxious appearing, not depressed appearing, poor concentration, memory impairment, and judgment fair.    Diabetes Management Exam:    Foot Exam (with socks and/or shoes not present):       Sensory-Pinprick/Light touch:          Left medial foot (L-4): normal          Left dorsal foot (L-5): normal          Left lateral foot (S-1): normal          Right medial foot (L-4): normal          Right dorsal foot (L-5): normal          Right lateral foot (S-1): normal       Sensory-Monofilament:          Left foot: normal          Right foot: normal       Inspection:          Left foot: normal          Right foot: normal       Nails:          Left foot: normal          Right foot: normal   Impression & Recommendations:  Problem # 1:  EDEMA (ICD-782.3) Assessment Unchanged  Discussed elevation of the legs, use of compression stockings, sodium restiction, and medication use.   Problem # 2:  HYPERTENSION (ICD-401.9) Assessment: Improved  His updated medication list for this problem includes:    Diovan 80 Mg Tabs (Valsartan) ..... Once daily  Orders: Venipuncture ([id number]) TLB-Lipid Panel (80061-LIPID) TLB-BMP (Basic Metabolic Panel-BMET) (80048-METABOL) TLB-CBC Platelet - w/Differential (85025-CBCD) TLB-Hepatic/Liver  Function Pnl (80076-HEPATIC) TLB-TSH (Thyroid Stimulating Hormone)  (84443-TSH) TLB-A1C / Hgb A1C (Glycohemoglobin) (83036-A1C) TLB-Microalbumin/Creat Ratio, Urine (82043-MALB) TLB-Udip w/ Micro (81001-URINE) T-Vitamin D (25-Hydroxy) (54098-11914)  BP today: 122/68 Prior BP: 140/82 (09/10/2009)  Prior 10 Yr Risk Heart Disease: 27 % (09/10/2009)  Labs Reviewed: K+: 4.5 (09/10/2009) Creat: : 1.0 (09/10/2009)   Chol: 183 (09/10/2009)   HDL: 43.50 (09/10/2009)   LDL: 129 (09/10/2009)   TG: 53.0 (09/10/2009)  Problem # 3:  HYPERLIPIDEMIA (ICD-272.4) Assessment: Unchanged  Orders: Venipuncture ([id number]) TLB-Lipid Panel (80061-LIPID) TLB-BMP (Basic Metabolic Panel-BMET) (80048-METABOL) TLB-CBC Platelet - w/Differential (85025-CBCD) TLB-Hepatic/Liver Function Pnl (80076-HEPATIC) TLB-TSH (Thyroid Stimulating Hormone) (84443-TSH) TLB-A1C / Hgb A1C (Glycohemoglobin) (83036-A1C) TLB-Microalbumin/Creat Ratio, Urine (82043-MALB) TLB-Udip w/ Micro (81001-URINE) T-Vitamin D (25-Hydroxy) (62130-86578)  Labs Reviewed: SGOT: 30 (09/10/2009)   SGPT: 30 (09/10/2009)  Lipid Goals: Chol Goal: 200 (02/10/2009)   HDL Goal: 40 (02/10/2009)   LDL Goal: 100 (02/10/2009)   TG Goal: 150 (02/10/2009)  Prior 10 Yr Risk Heart Disease: 27 % (09/10/2009)   HDL:43.50 (09/10/2009), 42.30 (02/10/2009)  LDL:129 (09/10/2009), 80 (46/96/2952)  Chol:183 (09/10/2009), 135 (02/10/2009)  Trig:53.0 (09/10/2009), 66.0 (02/10/2009)  His updated medication list for this problem includes:    Crestor 10 Mg Tabs (Rosuvastatin calcium) ..... One by mouth once daily for cholesterol  Problem # 4:  VITAMIN D DEFICIENCY (ICD-268.9) Assessment: Improved  Orders: Venipuncture (84132) TLB-Lipid Panel (80061-LIPID) TLB-BMP (Basic Metabolic Panel-BMET) (80048-METABOL) TLB-CBC Platelet - w/Differential (85025-CBCD) TLB-Hepatic/Liver Function Pnl (80076-HEPATIC) TLB-TSH (Thyroid Stimulating Hormone) (84443-TSH) TLB-A1C / Hgb A1C (Glycohemoglobin) (83036-A1C) TLB-Microalbumin/Creat Ratio, Urine  (82043-MALB) TLB-Udip w/ Micro (81001-URINE) T-Vitamin D (25-Hydroxy) (44010-27253)  Problem # 5:  DIABETES MELLITUS, TYPE II (ICD-250.00) Assessment: Unchanged  His updated medication list for this problem includes:    Lantus Solostar 100 Unit/ml Soln (Insulin glargine) .Marland Kitchen... 25  units sq  daily    Aspirin 81 Mg Tabs (Aspirin) .Marland Kitchen... Take 1 tablet by mouth once a day    Janumet 50-1000 Mg Tabs (Sitagliptin-metformin hcl) ..... One by mouth two times a day for diabetes    Diovan 80 Mg Tabs (Valsartan) ..... Once daily  Orders: Venipuncture ([id number]) TLB-Lipid Panel (80061-LIPID) TLB-BMP (Basic Metabolic Panel-BMET) (80048-METABOL) TLB-CBC Platelet - w/Differential (85025-CBCD) TLB-Hepatic/Liver Function Pnl (80076-HEPATIC) TLB-TSH (Thyroid Stimulating Hormone) (84443-TSH) TLB-A1C / Hgb A1C (Glycohemoglobin) (83036-A1C) TLB-Microalbumin/Creat Ratio, Urine (82043-MALB) TLB-Udip w/ Micro (81001-URINE) T-Vitamin D (25-Hydroxy) (34742-59563)  Labs Reviewed: Creat: 1.0 (09/10/2009)     Last Eye Exam: normal (10/19/2009) Reviewed HgBA1c results: 6.8 (09/10/2009)  7.5 (06/11/2009)  Complete Medication List: 1)  Vitamin D 87564 Unit Caps (Ergocalciferol) .Marland KitchenMarland KitchenMarland Kitchen 1 weekly 2)  Lantus Solostar 100 Unit/ml Soln (Insulin glargine) .... 25  units sq  daily 3)  Aspirin 81 Mg Tabs (Aspirin) .... Take 1 tablet by mouth once a day 4)  Preservision/lutein Caps (Multiple vitamins-minerals) .Marland Kitchen.. 1am and 1 pm 5)  Omega 3 1200mg   .... Take 1 tablet by mouth once a day 6)  Onetouch Ultra System W/device Kit (Blood glucose monitoring suppl) .... Use as driected 7)  Onetouch Ultra Test Strp (Glucose blood) .... Use three times a day as directed 8)  Janumet 50-1000 Mg Tabs (Sitagliptin-metformin hcl) .... One by mouth two times a day for diabetes 9)  Diovan 80 Mg Tabs (Valsartan) .... Once daily 10)  Bd Pen Needles [place] 31gx5/16  .... Use as directed 11)  Crestor 10 Mg Tabs (Rosuvastatin calcium) .... One  by mouth once daily for cholesterol  Lipid Assessment/Plan:      Based on  NCEP/ATP III, the patient's risk factor category is "history of diabetes".  The patient's lipid goals are as follows: Total cholesterol goal is 200; LDL cholesterol goal is 100; HDL cholesterol goal is 40; Triglyceride goal is 150.    Patient Instructions: 1)  Please schedule a follow-up appointment in 4 months. 2)  Limit your Sodium (Salt) to less than 4 grams a day (slightly less than 1 teaspoon) to prevent fluid retention, swelling, or worsening or symptoms. 3)  It is important that you exercise regularly at least 20 minutes 5 times a week. If you develop chest pain, have severe difficulty breathing, or feel very tired , stop exercising immediately and seek medical attention. 4)  You need to lose weight. Consider a lower calorie diet and regular exercise.  5)  Check your blood sugars regularly. If your readings are usually above 200  or below 70 you should contact our office. 6)  It is important that your Diabetic A1c level is checked every 3 months. 7)  See your eye doctor yearly to check for diabetic eye damage. 8)  Check your feet each night for sore areas, calluses or signs of infection. 9)  Check your Blood Pressure regularly. If it is above 130/80: you should make an appointment. Prescriptions: CRESTOR 10 MG TABS (ROSUVASTATIN CALCIUM) One by mouth once daily for cholesterol  #30 x 11   Entered and Authorized by:   Etta Grandchild MD   Signed by:   Etta Grandchild MD on 01/07/2010   Method used:   Print then Give to Patient   RxID:   [id number]

## 2010-09-30 NOTE — Progress Notes (Signed)
  Phone Note Outgoing Call   Summary of Call: I spoke to pt. and wife and advised them to strart anitbiotics since WBC is elevated I think he has c. diff infection and he will come in soon for a b12 injection Initial call taken by: Etta Grandchild MD,  August 12, 2010 12:39 PM    New/Updated Medications: METRONIDAZOLE 500 MG TABS (METRONIDAZOLE) One by mouth three times a day for 10 days Prescriptions: METRONIDAZOLE 500 MG TABS (METRONIDAZOLE) One by mouth three times a day for 10 days  #30 x 1   Entered and Authorized by:   Etta Grandchild MD   Signed by:   Etta Grandchild MD on 08/12/2010   Method used:   Electronically to        [facility]  [address] [id number]* (retail)       [address]       [place], [place]  [address]       Ph: [phone] or [phone]       Fax: [phone]   RxID:   [id number]

## 2010-09-30 NOTE — Assessment & Plan Note (Signed)
Summary: 2 WK ROA/NWS   Vital Signs:  Patient profile:   77 year old male Height:      68 inches Weight:      193 pounds BMI:     29.45 O2 Sat:      97 % on Room air Temp:     98.5 degrees F oral Pulse rate:   100 / minute Pulse rhythm:   regular Resp:     16 per minute BP sitting:   122 / 70  (left arm) Cuff size:   regular  Vitals Entered By: Rock Nephew CMA (September 09, 2010 1:33 PM)  Nutrition Counseling: Patient's BMI is greater than 25 and therefore counseled on weight management options.  O2 Flow:  Room air CC: follow-up visit, Hypertension Management Is Patient Diabetic? Yes Did you bring your meter with you today? No Pain Assessment Patient in pain? no       Does patient need assistance? Functional Status Self care Ambulation Normal   Primary Care Provider:  Etta Grandchild MD  CC:  follow-up visit and Hypertension Management.  History of Present Illness: He returns for his 3rd B12 injection and says that he feels well.  Hypertension History:      He denies headache, chest pain, palpitations, dyspnea with exertion, orthopnea, PND, peripheral edema, visual symptoms, neurologic problems, syncope, and side effects from treatment.  He notes no problems with any antihypertensive medication side effects.        Positive major cardiovascular risk factors include male age 54 years old or older, diabetes, hyperlipidemia, and hypertension.  Negative major cardiovascular risk factors include negative family history for ischemic heart disease and non-tobacco-user status.        Further assessment for target organ damage reveals no history of ASHD, cardiac end-organ damage (CHF/LVH), stroke/TIA, peripheral vascular disease, renal insufficiency, or hypertensive retinopathy.     Current Medications (verified): 1)  Vitamin D3 50000 Unit Caps (Cholecalciferol) .... Once Weekly 2)  Lantus Solostar 100 Unit/ml Soln (Insulin Glargine) .... 20  Units Sq  Daily 3)  Aspirin  81 Mg Tabs (Aspirin) .... Take 1 Tablet By Mouth Once A Day 4)  Preservision/lutein  Caps (Multiple Vitamins-Minerals) .Marland Kitchen.. 1am and 1 Pm 5)  Omega 3 1200mg  .... Take 1 Tablet By Mouth Once A Day 6)  Onetouch Ultra System W/device Kit (Blood Glucose Monitoring Suppl) .... Use As Driected 7)  Onetouch Ultra Test  Strp (Glucose Blood) .... Use Three Times A Day As Directed 8)  Janumet 50-1000 Mg Tabs (Sitagliptin-Metformin Hcl) .... One By Mouth Two Times A Day For Diabetes 9)  Bd Pen Needles [place]iltz 31gx5/16 .... Use As Directed 10)  Equate Arthritis Pain 11)  Livalo 2 Mg Tabs (Pitavastatin Calcium) .... One By Mouth Once Daily For Cholesterol 12)  Tramadol Hcl 50 Mg Tabs (Tramadol Hcl) .Marland Kitchen.. 1-2 By Mouth Qid As Needed For Pain 13)  Cipro  Allergies (verified): 1)  ! Percocet 2)  ! Penicillin 3)  ! Ace Inhibitors  Past History:  Past Medical History: Last updated: 11/10/2008 Skin cancer, hx of Diabetes mellitus, type II Osteoarthritis Vitamin D deficiency Hyperlipidemia Hypertension  Past Surgical History: Last updated: 11/10/2008 Inguinal herniorrhaphy Total knee replacement Cholecystectomy  Family History: Last updated: 11/10/2008 Family History Diabetes 1st degree relative  Social History: Last updated: 11/10/2008 Retired Married Never Smoked Alcohol use-no Drug use-no Regular exercise-no  Risk Factors: Alcohol Use: 0 (05/12/2010) >5 drinks/d w/in last 3 months: no (05/12/2010) Exercise: no (11/10/2008)  Risk  Factors: Smoking Status: never (05/12/2010)  Family History: Reviewed history from 11/10/2008 and no changes required. Family History Diabetes 1st degree relative  Social History: Reviewed history from 11/10/2008 and no changes required. Retired Married Never Smoked Alcohol use-no Drug use-no Regular exercise-no  Review of Systems  The patient denies anorexia, fever, weight loss, weight gain, chest pain, syncope, dyspnea on exertion, peripheral  edema, prolonged cough, headaches, hemoptysis, abdominal pain, hematuria, and muscle weakness.   Endo:  Denies cold intolerance, excessive hunger, excessive thirst, excessive urination, heat intolerance, polyuria, and weight change. Heme:  Denies abnormal bruising, bleeding, enlarge lymph nodes, fevers, pallor, and skin discoloration.  Physical Exam  General:  alert, well-developed, well-nourished, well-hydrated, appropriate dress, normal appearance, healthy-appearing, and cooperative to examination.   Head:  normocephalic, atraumatic, no abnormalities observed, and no abnormalities palpated.   Mouth:  Oral mucosa and oropharynx without lesions or exudates.  Teeth in good repair. Neck:  supple, full ROM, no masses, no JVD, no cervical lymphadenopathy, and no neck tenderness.   Lungs:  normal respiratory effort, no intercostal retractions, no accessory muscle use, normal breath sounds, no dullness, and no fremitus.   Heart:  Normal rate and regular rhythm. S1 and S2 normal without gallop, murmur, click, rub or other extra sounds. Abdomen:  soft, non-tender, normal bowel sounds, no distention, no masses, no guarding, no rigidity, no rebound tenderness, no hepatomegaly, and no splenomegaly.   Msk:  normal ROM, no joint tenderness, no joint swelling, and no joint warmth.   Pulses:  R and L carotid,radial,femoral,dorsalis pedis and posterior tibial pulses are full and equal bilaterally Extremities:  1+ left pedal edema and 1+ right pedal edema.   Neurologic:  alert & oriented X3, cranial nerves II-XII intact, strength normal in all extremities, gait normal, and DTRs symmetrical and normal.   Skin:  turgor normal, color normal, no rashes, no suspicious lesions, no ecchymoses, no petechiae, and no purpura.   Cervical Nodes:  No lymphadenopathy noted Psych:  Oriented X3, normally interactive, good eye contact, not anxious appearing, not depressed appearing, poor concentration, memory impairment, and  judgment fair.    Diabetes Management Exam:    Foot Exam (with socks and/or shoes not present):       Sensory-Pinprick/Light touch:          Left medial foot (L-4): normal          Left dorsal foot (L-5): normal          Left lateral foot (S-1): normal          Right medial foot (L-4): normal          Right dorsal foot (L-5): normal          Right lateral foot (S-1): normal       Sensory-Monofilament:          Left foot: normal          Right foot: normal       Inspection:          Left foot: normal          Right foot: normal       Nails:          Left foot: normal          Right foot: normal   Impression & Recommendations:  Problem # 1:  DIABETES MELLITUS, TYPE II (ICD-250.00) Assessment Improved  His updated medication list for this problem includes:    Lantus Solostar 100 Unit/ml Soln (Insulin glargine) .Marland KitchenMarland KitchenMarland KitchenMarland Kitchen  20  units sq  daily    Aspirin 81 Mg Tabs (Aspirin) .Marland Kitchen... Take 1 tablet by mouth once a day    Janumet 50-1000 Mg Tabs (Sitagliptin-metformin hcl) ..... One by mouth two times a day for diabetes  Labs Reviewed: Creat: 1.0 (08/11/2010)     Last Eye Exam: normal (10/19/2009) Reviewed HgBA1c results: 8.0 (08/11/2010)  8.1 (05/12/2010)  Problem # 2:  B12 DEFICIENCY (ICD-266.2) Assessment: Improved  Problem # 3:  ANEMIA ASSOCIATED W/OTHER SPEC NUTRITIONAL DEFIC (ICD-281.8) Assessment: Improved  His updated medication list for this problem includes:    Nascobal 500 Mcg/0.46ml Soln (Cyanocobalamin) ..... One puff in a nostril once a week  Hgb: 10.8 (08/11/2010)   Hct: 31.3 (08/11/2010)   Platelets: 493.0 (08/11/2010) RBC: 3.69 (08/11/2010)   RDW: 14.2 (08/11/2010)   WBC: 14.0 (08/11/2010) MCV: 84.8 (08/11/2010)   MCHC: 34.6 (08/11/2010) Iron: 48 (08/11/2010)   % Sat: 14.9 (08/11/2010) B12: 135 (08/11/2010)   Folate: 10.1 (08/11/2010)   TSH: 3.01 (08/11/2010)  Problem # 4:  HYPERTENSION (ICD-401.9) Assessment: Improved  Complete Medication List: 1)  Vitamin  D3 50000 Unit Caps (Cholecalciferol) .... Once weekly 2)  Lantus Solostar 100 Unit/ml Soln (Insulin glargine) .... 20  units sq  daily 3)  Aspirin 81 Mg Tabs (Aspirin) .... Take 1 tablet by mouth once a day 4)  Preservision/lutein Caps (Multiple vitamins-minerals) .Marland Kitchen.. 1am and 1 pm 5)  Omega 3 1200mg   .... Take 1 tablet by mouth once a day 6)  Onetouch Ultra System W/device Kit (Blood glucose monitoring suppl) .... Use as driected 7)  Onetouch Ultra Test Strp (Glucose blood) .... Use three times a day as directed 8)  Janumet 50-1000 Mg Tabs (Sitagliptin-metformin hcl) .... One by mouth two times a day for diabetes 9)  Bd Pen Needles [place] 31gx5/16  .... Use as directed 10)  Equate Arthritis Pain  11)  Livalo 2 Mg Tabs (Pitavastatin calcium) .... One by mouth once daily for cholesterol 12)  Tramadol Hcl 50 Mg Tabs (Tramadol hcl) .Marland Kitchen.. 1-2 by mouth qid as needed for pain 13)  Cipro  14)  Nascobal 500 Mcg/0.65ml Soln (Cyanocobalamin) .... One puff in a nostril once a week  Hypertension Assessment/Plan:      The patient's hypertensive risk group is category C: Target organ damage and/or diabetes.  His calculated 10 year risk of coronary heart disease is 22 %.  Today's blood pressure is 122/70.  His blood pressure goal is < 130/80.  Patient Instructions: 1)  Please schedule a follow-up appointment in 3 months. 2)  Check your blood sugars regularly. If your readings are usually above 200 or below 70 you should contact our office. 3)  It is important that your Diabetic A1c level is checked every 3 months. 4)  See your eye doctor yearly to check for diabetic eye damage. 5)  Check your feet each night for sore areas, calluses or signs of infection. 6)  Check your Blood Pressure regularly. If it is above 130/80: you should make an appointment. Prescriptions: NASCOBAL 500 MCG/0.1ML SOLN (CYANOCOBALAMIN) One puff in a nostril once a week  #1 bottle x 11   Entered and Authorized by:   Etta Grandchild  MD   Signed by:   Etta Grandchild MD on 09/09/2010   Method used:   Print then Give to Patient   RxID:   [id number]    Medication Administration  Injection # 1:    Medication: Vit B12 1000 mcg  Diagnosis: B12 DEFICIENCY (ICD-266.2)    Route: IM    Exp Date: 04/2012    Lot #: [id number]    Mfr: [employer]    Patient tolerated injection without complications    Given by: Rock Nephew CMA (September 09, 2010 1:37 PM)  Orders Added: 1)  Est. Patient Level IV [99214]     Medication Administration  Injection # 1:    Medication: Vit B12 1000 mcg    Diagnosis: B12 DEFICIENCY (ICD-266.2)    Route: IM    Exp Date: 04/2012    Lot #: [id number]    Mfr: [employer]    Patient tolerated injection without complications    Given by: Rock Nephew CMA (September 09, 2010 1:37 PM)  Orders Added: 1)  Est. Patient Level IV [29562]

## 2010-09-30 NOTE — Assessment & Plan Note (Signed)
Summary: 2 wk f/u / #/ cd   Vital Signs:  Patient profile:   77 year old male Height:      68 inches (172.72 cm) Weight:      196.25 pounds (89.20 kg) BMI:     29.95 O2 Sat:      95 % on Room air Temp:     98.7 degrees F (37.06 degrees C) oral Pulse rate:   96 / minute Pulse rhythm:   regular Resp:     16 per minute BP sitting:   116 / 70  (left arm) Cuff size:   large  Vitals Entered By: Brenton Grills CMA Duncan Dull) (August 27, 2010 1:11 PM)  Nutrition Counseling: Patient's BMI is greater than 25 and therefore counseled on weight management options.  O2 Flow:  Room air CC: 2 week F/U/aj, Hypertension Management, Lipid Management Is Patient Diabetic? Yes Did you bring your meter with you today? No Pain Assessment Patient in pain? no        Primary Care [place]:  Etta Grandchild MD  CC:  2 week F/U/aj, Hypertension Management, and Lipid Management.  History of Present Illness: He returns for f/up and tells me that he is feeling much better. He needs his 2nd B12 injection today.  Hypertension History:      He denies headache, chest pain, palpitations, dyspnea with exertion, orthopnea, PND, peripheral edema, visual symptoms, neurologic problems, syncope, and side effects from treatment.  He notes no problems with any antihypertensive medication side effects.        Positive major cardiovascular risk factors include male age 57 years old or older, diabetes, hyperlipidemia, and hypertension.  Negative major cardiovascular risk factors include negative family history for ischemic heart disease and non-tobacco-user status.        Further assessment for target organ damage reveals no history of ASHD, cardiac end-organ damage (CHF/LVH), stroke/TIA, peripheral vascular disease, renal insufficiency, or hypertensive retinopathy.    Lipid Management History:      Positive NCEP/ATP III risk factors include male age 43 years old or older, diabetes, HDL cholesterol less than 40, and  hypertension.  Negative NCEP/ATP III risk factors include no family history for ischemic heart disease, non-tobacco-user status, no ASHD (atherosclerotic heart disease), no prior stroke/TIA, no peripheral vascular disease, and no history of aortic aneurysm.        The patient states that he knows about the "Therapeutic Lifestyle Change" diet.  His compliance with the TLC diet is poor.  The patient expresses understanding of adjunctive measures for cholesterol lowering.  Adjunctive measures started by the patient include fiber, limit alcohol consumpton, and weight reduction.  He expresses no side effects from his lipid-lowering medication.  The patient denies any symptoms to suggest myopathy or liver disease.      Current Medications (verified): 1)  Vitamin D 29518 Unit Caps (Ergocalciferol) .Marland Kitchen.. 1 Weekly 2)  Lantus Solostar 100 Unit/ml Soln (Insulin Glargine) .... 20  Units Sq  Daily 3)  Aspirin 81 Mg Tabs (Aspirin) .... Take 1 Tablet By Mouth Once A Day 4)  Preservision/lutein  Caps (Multiple Vitamins-Minerals) .Marland Kitchen.. 1am and 1 Pm 5)  Omega 3 1200mg  .... Take 1 Tablet By Mouth Once A Day 6)  Onetouch Ultra System W/device Kit (Blood Glucose Monitoring Suppl) .... Use As Driected 7)  Onetouch Ultra Test  Strp (Glucose Blood) .... Use Three Times A Day As Directed 8)  Janumet 50-1000 Mg Tabs (Sitagliptin-Metformin Hcl) .... One By Mouth Two Times A Day  For Diabetes 9)  Bd Pen Needles Klecka 31gx5/16 .... Use As Directed 10)  Equate Arthritis Pain 11)  Livalo 2 Mg Tabs (Pitavastatin Calcium) .... One By Mouth Once Daily For Cholesterol 12)  Tramadol Hcl 50 Mg Tabs (Tramadol Hcl) .Marland Kitchen.. 1-2 By Mouth Qid As Needed For Pain  Allergies (verified): 1)  ! Percocet 2)  ! Penicillin 3)  ! Ace Inhibitors  Past History:  Past Medical History: Last updated: 11/10/2008 Skin cancer, hx of Diabetes mellitus, type II Osteoarthritis Vitamin D deficiency Hyperlipidemia Hypertension  Past Surgical  History: Last updated: 11/10/2008 Inguinal herniorrhaphy Total knee replacement Cholecystectomy  Family History: Last updated: 11/10/2008 Family History Diabetes 1st degree relative  Social History: Last updated: 11/10/2008 Retired Married Never Smoked Alcohol use-no Drug use-no Regular exercise-no  Risk Factors: Alcohol Use: 0 (05/12/2010) >5 drinks/d w/in last 3 months: no (05/12/2010) Exercise: no (11/10/2008)  Risk Factors: Smoking Status: never (05/12/2010)  Family History: Reviewed history from 11/10/2008 and no changes required. Family History Diabetes 1st degree relative  Social History: Reviewed history from 11/10/2008 and no changes required. Retired Married Never Smoked Alcohol use-no Drug use-no Regular exercise-no  Review of Systems  The patient denies anorexia, fever, weight loss, weight gain, chest pain, syncope, dyspnea on exertion, peripheral edema, prolonged cough, headaches, hemoptysis, abdominal pain, and hematuria.   Endo:  Denies cold intolerance, excessive hunger, excessive thirst, excessive urination, heat intolerance, polyuria, and weight change.  Physical Exam  General:  alert, well-developed, well-nourished, well-hydrated, appropriate dress, normal appearance, healthy-appearing, and cooperative to examination.   Head:  normocephalic, atraumatic, no abnormalities observed, and no abnormalities palpated.   Eyes:  vision grossly intact and pupils equal.   Ears:  R ear normal and L ear normal.   Nose:  External nasal examination shows no deformity or inflammation. Nasal mucosa are pink and moist without lesions or exudates. Mouth:  Oral mucosa and oropharynx without lesions or exudates.  Teeth in good repair. Neck:  supple, full ROM, no masses, no JVD, no cervical lymphadenopathy, and no neck tenderness.   Lungs:  normal respiratory effort, no intercostal retractions, no accessory muscle use, normal breath sounds, no dullness, and no  fremitus.   Heart:  Normal rate and regular rhythm. S1 and S2 normal without gallop, murmur, click, rub or other extra sounds. Abdomen:  soft, non-tender, normal bowel sounds, no distention, no masses, no guarding, no rigidity, no rebound tenderness, no hepatomegaly, and no splenomegaly.   Msk:  normal ROM, no joint tenderness, no joint swelling, and no joint warmth.   Pulses:  R and L carotid,radial,femoral,dorsalis pedis and posterior tibial pulses are full and equal bilaterally Extremities:  1+ left pedal edema and 1+ right pedal edema.   Neurologic:  alert & oriented X3, cranial nerves II-XII intact, strength normal in all extremities, gait normal, and DTRs symmetrical and normal.   Skin:  turgor normal, color normal, no rashes, no suspicious lesions, no ecchymoses, no petechiae, and no purpura.   Cervical Nodes:  No lymphadenopathy noted Axillary Nodes:  No palpable lymphadenopathy Psych:  Oriented X3, normally interactive, good eye contact, not anxious appearing, not depressed appearing, poor concentration, memory impairment, and judgment fair.    Diabetes Management Exam:    Foot Exam (with socks and/or shoes not present):       Sensory-Pinprick/Light touch:          Left medial foot (L-4): normal          Left dorsal foot (L-5): normal  Left lateral foot (S-1): normal          Right medial foot (L-4): normal          Right dorsal foot (L-5): normal          Right lateral foot (S-1): normal       Sensory-Monofilament:          Left foot: normal          Right foot: normal       Inspection:          Left foot: normal          Right foot: normal       Nails:          Left foot: normal          Right foot: normal   Impression & Recommendations:  Problem # 1:  B12 DEFICIENCY (ICD-266.2) Assessment Improved  Orders: Admin of Therapeutic Inj  intramuscular or subcutaneous (36644) Vit B12 1000 mcg (J3420)  Problem # 2:  HYPERTENSION (ICD-401.9) Assessment:  Improved  BP today: 116/70 Prior BP: 82/50 (08/11/2010)  Prior 10 Yr Risk Heart Disease: 22 % (08/11/2010)  Labs Reviewed: K+: 4.1 (08/11/2010) Creat: : 1.0 (08/11/2010)   Chol: 98 (05/12/2010)   HDL: 33.70 (05/12/2010)   LDL: 54 (05/12/2010)   TG: 50.0 (05/12/2010)  Problem # 3:  DIABETES MELLITUS, TYPE II (ICD-250.00) Assessment: Unchanged  His updated medication list for this problem includes:    Lantus Solostar 100 Unit/ml Soln (Insulin glargine) .Marland Kitchen... 20  units sq  daily    Aspirin 81 Mg Tabs (Aspirin) .Marland Kitchen... Take 1 tablet by mouth once a day    Janumet 50-1000 Mg Tabs (Sitagliptin-metformin hcl) ..... One by mouth two times a day for diabetes  Labs Reviewed: Creat: 1.0 (08/11/2010)     Last Eye Exam: normal (10/19/2009) Reviewed HgBA1c results: 8.0 (08/11/2010)  8.1 (05/12/2010)  Problem # 4:  HYPERLIPIDEMIA (ICD-272.4) Assessment: Unchanged  His updated medication list for this problem includes:    Livalo 2 Mg Tabs (Pitavastatin calcium) ..... One by mouth once daily for cholesterol  Labs Reviewed: SGOT: 26 (08/11/2010)   SGPT: 25 (08/11/2010)  Lipid Goals: Chol Goal: 200 (02/10/2009)   HDL Goal: 40 (02/10/2009)   LDL Goal: 100 (02/10/2009)   TG Goal: 150 (02/10/2009)  Prior 10 Yr Risk Heart Disease: 22 % (08/11/2010)   HDL:33.70 (05/12/2010), 44.60 (01/07/2010)  LDL:54 (05/12/2010), 109 (03/47/4259)  Chol:98 (05/12/2010), 163 (01/07/2010)  Trig:50.0 (05/12/2010), 49.0 (01/07/2010)  Complete Medication List: 1)  Vitamin D 56387 Unit Caps (Ergocalciferol) .Marland KitchenMarland KitchenMarland Kitchen 1 weekly 2)  Lantus Solostar 100 Unit/ml Soln (Insulin glargine) .... 20  units sq  daily 3)  Aspirin 81 Mg Tabs (Aspirin) .... Take 1 tablet by mouth once a day 4)  Preservision/lutein Caps (Multiple vitamins-minerals) .Marland Kitchen.. 1am and 1 pm 5)  Omega 3 1200mg   .... Take 1 tablet by mouth once a day 6)  Onetouch Ultra System W/device Kit (Blood glucose monitoring suppl) .... Use as driected 7)  Onetouch Ultra  Test Strp (Glucose blood) .... Use three times a day as directed 8)  Janumet 50-1000 Mg Tabs (Sitagliptin-metformin hcl) .... One by mouth two times a day for diabetes 9)  Bd Pen Needles [place]arco 31gx5/16  .... Use as directed 10)  Equate Arthritis Pain  11)  Livalo 2 Mg Tabs (Pitavastatin calcium) .... One by mouth once daily for cholesterol 12)  Tramadol Hcl 50 Mg Tabs (Tramadol hcl) .Marland Kitchen.. 1-2 by mouth qid as  needed for pain  Hypertension Assessment/Plan:      The patient's hypertensive risk group is category C: Target organ damage and/or diabetes.  His calculated 10 year risk of coronary heart disease is 22 %.  Today's blood pressure is 116/70.  His blood pressure goal is < 130/80.  Lipid Assessment/Plan:      Based on NCEP/ATP III, the patient's risk factor category is "history of diabetes".  The patient's lipid goals are as follows: Total cholesterol goal is 200; LDL cholesterol goal is 100; HDL cholesterol goal is 40; Triglyceride goal is 150.    Patient Instructions: 1)  Please schedule a follow-up appointment in 2 weeks for your 3rd B12 shot. Prescriptions: BD PEN NEEDLES Devargas 31GX5/16 Use as directed  #100 x 11   Entered and Authorized by:   Etta Grandchild MD   Signed by:   Etta Grandchild MD on 08/27/2010   Method used:   Printed then faxed to ...       [facility]  [address] # [id number]* (retail)       [address]       [place], [place]  [address]       Ph: [phone] or [phone]       Fax: [phone]   RxID:   [id number] LIVALO 2 MG TABS (PITAVASTATIN CALCIUM) One by mouth once daily for cholesterol  #30 x 11   Entered and Authorized by:   Etta Grandchild MD   Signed by:   Etta Grandchild MD on 08/27/2010   Method used:   Electronically to        [facility]  [address] # [id number]* (retail)       [address]       [place], [place]  [address]       Ph: [phone] or [phone]       Fax: [phone]   RxID:    [id number]    Medication Administration  Injection # 1:    Medication: Vit B12 1000 mcg    Diagnosis: B12 DEFICIENCY (ICD-266.2)    Route: IM    Site: L deltoid    Exp Date: 04/2012    Lot #: [id number]    Mfr: [employer]    Patient tolerated injection without complications    Given by: Brenton Grills CMA Duncan Dull) (August 27, 2010 1:39 PM)  Orders Added: 1)  Admin of Therapeutic Inj  intramuscular or subcutaneous [96372] 2)  Vit B12 1000 mcg [J3420] 3)  Est. Patient Level IV [54270]

## 2010-09-30 NOTE — Progress Notes (Signed)
Summary: Pharm req alternative.   Phone Note From Pharmacy   Caller: [facility]  [address] # [id number]* Summary of Call: Crestor is not covered by insurance. Lipitor or simvastatin are preferred alternatives. Please advise.  Initial call taken by: Lamar Sprinkles, CMA,  January 27, 2010 4:05 PM    New/Updated Medications: LIPITOR 40 MG TABS (ATORVASTATIN CALCIUM) once daily Prescriptions: LIPITOR 40 MG TABS (ATORVASTATIN CALCIUM) once daily  #30 x 11   Entered and Authorized by:   Etta Grandchild MD   Signed by:   Etta Grandchild MD on 01/27/2010   Method used:   Electronically to        [facility]  [address] # [id number]* (retail)       [address]       [place], [place]  [address]       Ph: [phone] or [phone]       Fax: [phone]   RxID:   [id number]

## 2010-09-30 NOTE — Letter (Signed)
Summary: Results Follow-up Letter  [facility]  [address] [place], [place] [address]   Phone: [phone]  Fax: [phone]    09/10/2009  [address] [place], [place]  [address]  Dear Mr. Collar,   The following are the results of your recent test(s):  Test     Result     Blood sugars   good Liver/kidney   normal CBC       normal Urine       normal   _________________________________________________________  Please call for an appointment as directed _________________________________________________________ _________________________________________________________ _________________________________________________________  Sincerely,  Sanda Linger MD [facility]

## 2010-09-30 NOTE — Letter (Signed)
Summary: Lipid Letter  [facility]  [address] [place], [place] [address]   Phone: [phone]  Fax: [phone]    01/07/2010  Nicholas Burton [address] [place], [place]  [address]  Dear [name]:  We have carefully reviewed your last lipid profile from 01/07/2010 and the results are noted below with a summary of recommendations for lipid management.    Cholesterol:       163     Goal: <200   HDL "good" Cholesterol:   56.38     Goal: >40   LDL "bad" Cholesterol:   109     Goal: <100   Triglycerides:       49.0     Goal: <150        TLC Diet (Therapeutic Lifestyle Change): Saturated Fats & Transfatty acids should be kept < 7% of total calories ***Reduce Saturated Fats Polyunstaurated Fat can be up to 10% of total calories Monounsaturated Fat Fat can be up to 20% of total calories Total Fat should be no greater than 25-35% of total calories Carbohydrates should be 50-60% of total calories Protein should be approximately 15% of total calories Fiber should be at least 20-30 grams a day ***Increased fiber may help lower LDL Total Cholesterol should be < 200mg /day Consider adding plant stanol/sterols to diet (example: Benacol spread) ***A higher intake of unsaturated fat may reduce Triglycerides and Increase HDL    Adjunctive Measures (may lower LIPIDS and reduce risk of Heart Attack) include: Aerobic Exercise (20-30 minutes 3-4 times a week) Limit Alcohol Consumption Weight Reduction Aspirin 75-81 mg a day by mouth (if not allergic or contraindicated) Dietary Fiber 20-30 grams a day by mouth     Current Medications: 1)    Vitamin D 75643 Unit Caps (Ergocalciferol) .Marland KitchenMarland KitchenMarland Kitchen 1 weekly 2)    Lantus Solostar 100 Unit/ml Soln (Insulin glargine) .... 25  units sq  daily 3)    Aspirin 81 Mg Tabs (Aspirin) .... Take 1 tablet by mouth once a day 4)    Preservision/lutein  Caps (Multiple vitamins-minerals) .Marland Kitchen.. 1am and 1 pm 5)    Omega 3 1200mg   .... Take 1 tablet by  mouth once a day 6)    Onetouch Ultra System W/device Kit (Blood glucose monitoring suppl) .... Use as driected 7)    Onetouch Ultra Test  Strp (Glucose blood) .... Use three times a day as directed 8)    Janumet 50-1000 Mg Tabs (Sitagliptin-metformin hcl) .... One by mouth two times a day for diabetes 9)    Diovan 80 Mg Tabs (Valsartan) .... Once daily 10)    Bd Pen Needles Manard 31gx5/16  .... Use as directed 11)    Crestor 10 Mg Tabs (Rosuvastatin calcium) .... One by mouth once daily for cholesterol  If you have any questions, please call. We appreciate being able to work with you.   Sincerely,    [facility] Etta Grandchild MD

## 2010-09-30 NOTE — Letter (Signed)
Summary: Lipid Letter  [facility]  [address] [place], [place] [address]   Phone: [phone]  Fax: [phone]    05/12/2010  Nicholas Burton [address] [place], [place]  [address]  Dear Nicholas Burton:  We have carefully reviewed your last lipid profile from 05/12/2010 and the results are noted below with a summary of recommendations for lipid management.    Cholesterol:       98     Goal: <200   HDL "good" Cholesterol:   41.32     Goal: >40   LDL "bad" Cholesterol:   54     Goal: <100   Triglycerides:       50.0     Goal: <150        TLC Diet (Therapeutic Lifestyle Change): Saturated Fats & Transfatty acids should be kept < 7% of total calories ***Reduce Saturated Fats Polyunstaurated Fat can be up to 10% of total calories Monounsaturated Fat Fat can be up to 20% of total calories Total Fat should be no greater than 25-35% of total calories Carbohydrates should be 50-60% of total calories Protein should be approximately 15% of total calories Fiber should be at least 20-30 grams a day ***Increased fiber may help lower LDL Total Cholesterol should be < 200mg /day Consider adding plant stanol/sterols to diet (example: Benacol spread) ***A higher intake of unsaturated fat may reduce Triglycerides and Increase HDL    Adjunctive Measures (may lower LIPIDS and reduce risk of Heart Attack) include: Aerobic Exercise (20-30 minutes 3-4 times a week) Limit Alcohol Consumption Weight Reduction Aspirin 75-81 mg a day by mouth (if not allergic or contraindicated) Dietary Fiber 20-30 grams a day by mouth     Current Medications: 1)    Vitamin D 44010 Unit Caps (Ergocalciferol) .Marland KitchenMarland KitchenMarland Kitchen 1 weekly 2)    Lantus Solostar 100 Unit/ml Soln (Insulin glargine) .... 20  units sq  daily 3)    Aspirin 81 Mg Tabs (Aspirin) .... Take 1 tablet by mouth once a day 4)    Preservision/lutein  Caps (Multiple vitamins-minerals) .Marland Kitchen.. 1am and 1 pm 5)    Omega 3 1200mg   .... Take 1 tablet by  mouth once a day 6)    Onetouch Ultra System W/device Kit (Blood glucose monitoring suppl) .... Use as driected 7)    Onetouch Ultra Test  Strp (Glucose blood) .... Use three times a day as directed 8)    Janumet 50-1000 Mg Tabs (Sitagliptin-metformin hcl) .... One by mouth two times a day for diabetes 9)    Diovan 80 Mg Tabs (Valsartan) .... Once daily 10)    Bd Pen Needles [place] 31gx5/16  .... Use as directed 11)    Equate Arthritis Pain  12)    Livalo 2 Mg Tabs (Pitavastatin calcium) .... One by mouth once daily for cholesterol  If you have any questions, please call. We appreciate being able to work with you.   Sincerely,    [facility] Etta Grandchild MD

## 2010-09-30 NOTE — Letter (Signed)
Summary: [facility]   Imported By: Sherian Rein 09/14/2010 09:33:19  _____________________________________________________________________  External Attachment:    Type:   Image     Comment:   External Document

## 2010-09-30 NOTE — Letter (Signed)
Summary: Lipid Letter  [name]  [address] [place], [place] [address]   Phone: [phone]  Fax: [phone]    09/10/2009  Nicholas Burton [address] [place], [place]  [address]  Dear [name]:  We have carefully reviewed your last lipid profile from 09/10/2009 and the results are noted below with a summary of recommendations for lipid management.    Cholesterol:       183     Goal: <200   HDL "good" Cholesterol:   57.84     Goal: >40   LDL "bad" Cholesterol:   129     Goal: <100   Triglycerides:       53.0     Goal: <150    pretty good results    TLC Diet (Therapeutic Lifestyle Change): Saturated Fats & Transfatty acids should be kept < 7% of total calories ***Reduce Saturated Fats Polyunstaurated Fat can be up to 10% of total calories Monounsaturated Fat Fat can be up to 20% of total calories Total Fat should be no greater than 25-35% of total calories Carbohydrates should be 50-60% of total calories Protein should be approximately 15% of total calories Fiber should be at least 20-30 grams a day ***Increased fiber may help lower LDL Total Cholesterol should be < 200mg /day Consider adding plant stanol/sterols to diet (example: Benacol spread) ***A higher intake of unsaturated fat may reduce Triglycerides and Increase HDL    Adjunctive Measures (may lower LIPIDS and reduce risk of Heart Attack) include: Aerobic Exercise (20-30 minutes 3-4 times a week) Limit Alcohol Consumption Weight Reduction Aspirin 75-81 mg a day by mouth (if not allergic or contraindicated) Dietary Fiber 20-30 grams a day by mouth     Current Medications: 1)    Vitamin D 69629 Unit Caps (Ergocalciferol) .Marland KitchenMarland KitchenMarland Kitchen 1 weekly 2)    Lantus Solostar 100 Unit/ml Soln (Insulin glargine) .... 25  units sq  daily 3)    Aspirin 81 Mg Tabs (Aspirin) .... Take 1 tablet by mouth once a day 4)    Preservision/lutein  Caps (Multiple vitamins-minerals) .Marland Kitchen.. 1am and 1 pm 5)    Omega 3 1200mg    .... Take 1 tablet by mouth once a day 6)    Onetouch Ultra System W/device Kit (Blood glucose monitoring suppl) .... Use as driected 7)    Onetouch Ultra Test  Strp (Glucose blood) .... Use three times a day as directed 8)    Janumet 50-1000 Mg Tabs (Sitagliptin-metformin hcl) .... One by mouth two times a day for diabetes 9)    Diovan 80 Mg Tabs (Valsartan) .... Once daily 10)    Bd Pen Needles [place]aslin 31gx5/16  .... Use as directed  If you have any questions, please call. We appreciate being able to work with you.   Sincerely,    [facility] Etta Grandchild MD

## 2010-09-30 NOTE — Progress Notes (Signed)
  Phone Note Outgoing Call   Summary of Call: LA:  His B12 level is low, please ask him to come in asap for a B12 shot  TJ Initial call taken by: Etta Grandchild MD,  August 11, 2010 1:54 PM  Follow-up for Phone Call        Patient wife notified and will make appt  (see other phone note).Alvy Beal Archie CMA  August 12, 2010 1:33 PM

## 2010-09-30 NOTE — Letter (Signed)
Summary: Results Follow-up Letter  [facility]  [address] [place], [place] [address]   Phone: [phone]  Fax: [phone]    05/12/2010  [address] [place], [place]  [address]  Dear Nicholas Burton,   The following are the results of your recent test(s):  Test     Result     Blood sugars   too high Liver/kidney   normal CBC       normal Thyroid     normal  _________________________________________________________  Please call for an appointment soon _________________________________________________________ _________________________________________________________ _________________________________________________________  Sincerely,  Sanda Linger MD [facility]

## 2010-09-30 NOTE — Letter (Signed)
Summary: [facility]  [facility]   Imported By: Lester Nicholson 08/06/2010 10:34:31  _____________________________________________________________________  External Attachment:    Type:   Image     Comment:   External Document

## 2010-09-30 NOTE — Letter (Signed)
Summary: [facility]  [facility]   Imported By: Lester [place] 10/23/2009 07:33:56  _____________________________________________________________________  External Attachment:    Type:   Image     Comment:   External Document

## 2010-10-14 ENCOUNTER — Encounter: Payer: Self-pay | Admitting: Internal Medicine

## 2010-10-26 NOTE — Letter (Signed)
Summary: [facility]  [facility]   Imported By: Lester Rodney 10/22/2010 09:22:28  _____________________________________________________________________  External Attachment:    Type:   Image     Comment:   External Document

## 2010-10-27 ENCOUNTER — Encounter: Payer: Self-pay | Admitting: Internal Medicine

## 2010-10-27 LAB — HM DIABETES EYE EXAM: HM Diabetic Eye Exam: NORMAL

## 2010-11-02 ENCOUNTER — Telehealth: Payer: Self-pay | Admitting: Internal Medicine

## 2010-11-08 LAB — URINALYSIS, ROUTINE W REFLEX MICROSCOPIC
Bilirubin Urine: NEGATIVE
Specific Gravity, Urine: 1.037 — ABNORMAL HIGH (ref 1.005–1.030)
Urobilinogen, UA: 0.2 mg/dL (ref 0.0–1.0)

## 2010-11-08 LAB — CBC
MCH: 29 pg (ref 26.0–34.0)
MCV: 83.5 fL (ref 78.0–100.0)
Platelets: 170 10*3/uL (ref 150–400)
RBC: 3.34 MIL/uL — ABNORMAL LOW (ref 4.22–5.81)

## 2010-11-08 LAB — PROTIME-INR: INR: 1.67 — ABNORMAL HIGH (ref 0.00–1.49)

## 2010-11-08 LAB — URINE CULTURE: Colony Count: >=100000

## 2010-11-08 LAB — URINE MICROSCOPIC-ADD ON

## 2010-11-09 LAB — BASIC METABOLIC PANEL
BUN: 14 mg/dL (ref 6–23)
Calcium: 8.1 mg/dL — ABNORMAL LOW (ref 8.4–10.5)
Chloride: 105 mEq/L (ref 96–112)
Chloride: 107 mEq/L (ref 96–112)
Creatinine, Ser: 0.95 mg/dL (ref 0.4–1.5)
GFR calc Af Amer: >60 mL/min (ref >60)
GFR calc non Af Amer: >60 mL/min (ref >60)
Potassium: 4.4 mEq/L (ref 3.5–5.1)
Sodium: 141 mEq/L (ref 135–145)

## 2010-11-09 LAB — CBC
HCT: 30.7 % — ABNORMAL LOW (ref 39.0–52.0)
HCT: 41.5 % (ref 39.0–52.0)
Hemoglobin: 10.4 g/dL — ABNORMAL LOW (ref 13.0–17.0)
Hemoglobin: 14.1 g/dL (ref 13.0–17.0)
MCH: 29.6 pg (ref 26.0–34.0)
MCHC: 34 g/dL (ref 30.0–36.0)
MCV: 83.4 fL (ref 78.0–100.0)
MCV: 85.5 fL (ref 78.0–100.0)
Platelets: 234 10*3/uL (ref 150–400)
Platelets: 239 10*3/uL (ref 150–400)
RBC: 3.75 MIL/uL — ABNORMAL LOW (ref 4.22–5.81)
RBC: 4.8 MIL/uL (ref 4.22–5.81)
RDW: 13.4 % (ref 11.5–15.5)
RDW: 14.1 % (ref 11.5–15.5)
WBC: 11.7 10*3/uL — ABNORMAL HIGH (ref 4.0–10.5)
WBC: 13.1 10*3/uL — ABNORMAL HIGH (ref 4.0–10.5)
WBC: 8.9 10*3/uL (ref 4.0–10.5)

## 2010-11-09 LAB — URINALYSIS, ROUTINE W REFLEX MICROSCOPIC
Bilirubin Urine: NEGATIVE
Glucose, UA: 250 mg/dL — AB
Hgb urine dipstick: NEGATIVE
Nitrite: NEGATIVE
Protein, ur: NEGATIVE mg/dL
Specific Gravity, Urine: 1.024 (ref 1.005–1.030)
Urobilinogen, UA: 0.2 mg/dL (ref 0.0–1.0)

## 2010-11-09 LAB — PROTIME-INR
INR: 0.99 (ref 0.00–1.49)
INR: 1.29 (ref 0.00–1.49)
Prothrombin Time: 14.8 seconds (ref 11.6–15.2)

## 2010-11-09 LAB — COMPREHENSIVE METABOLIC PANEL
ALT: 19 U/L (ref 0–53)
Albumin: 3.4 g/dL — ABNORMAL LOW (ref 3.5–5.2)
Alkaline Phosphatase: 91 U/L (ref 39–117)
BUN: 14 mg/dL (ref 6–23)
Creatinine, Ser: 1.08 mg/dL (ref 0.4–1.5)
Glucose, Bld: 250 mg/dL — ABNORMAL HIGH (ref 70–99)
Potassium: 4.8 mEq/L (ref 3.5–5.1)
Sodium: 141 mEq/L (ref 135–145)
Total Bilirubin: 0.5 mg/dL (ref 0.3–1.2)

## 2010-11-09 LAB — TYPE AND SCREEN
ABO/RH(D): O POS
Antibody Screen: NEGATIVE

## 2010-11-09 LAB — GLUCOSE, CAPILLARY
Glucose-Capillary: 131 mg/dL — ABNORMAL HIGH (ref 70–99)
Glucose-Capillary: 148 mg/dL — ABNORMAL HIGH (ref 70–99)
Glucose-Capillary: 151 mg/dL — ABNORMAL HIGH (ref 70–99)
Glucose-Capillary: 213 mg/dL — ABNORMAL HIGH (ref 70–99)
Glucose-Capillary: 224 mg/dL — ABNORMAL HIGH (ref 70–99)
Glucose-Capillary: 228 mg/dL — ABNORMAL HIGH (ref 70–99)
Glucose-Capillary: 232 mg/dL — ABNORMAL HIGH (ref 70–99)

## 2010-11-09 LAB — SURGICAL PCR SCREEN: Staphylococcus aureus: NEGATIVE

## 2010-11-09 LAB — APTT: aPTT: 33 seconds (ref 24–37)

## 2010-11-09 LAB — ABO/RH: ABO/RH(D): O POS

## 2010-11-09 NOTE — Progress Notes (Signed)
     Diabetes Management Exam:    Eye Exam:       Eye Exam done elsewhere          Date: 10/27/2010          Results: normal          Done by: Harriette Bouillon

## 2010-11-09 NOTE — Letter (Signed)
Summary: [facility]  [facility]   Imported By: Sherian Rein 11/05/2010 12:23:59  _____________________________________________________________________  External Attachment:    Type:   Image     Comment:   External Document

## 2010-11-10 ENCOUNTER — Encounter: Payer: Self-pay | Admitting: Internal Medicine

## 2010-11-10 ENCOUNTER — Other Ambulatory Visit: Payer: Self-pay | Admitting: Internal Medicine

## 2010-11-10 ENCOUNTER — Ambulatory Visit (INDEPENDENT_AMBULATORY_CARE_PROVIDER_SITE_OTHER): Payer: Medicare Other | Admitting: Internal Medicine

## 2010-11-10 ENCOUNTER — Other Ambulatory Visit: Payer: Medicare Other

## 2010-11-10 DIAGNOSIS — E119 Type 2 diabetes mellitus without complications: Secondary | ICD-10-CM

## 2010-11-10 DIAGNOSIS — R609 Edema, unspecified: Secondary | ICD-10-CM

## 2010-11-10 DIAGNOSIS — E538 Deficiency of other specified B group vitamins: Secondary | ICD-10-CM

## 2010-11-10 DIAGNOSIS — I10 Essential (primary) hypertension: Secondary | ICD-10-CM

## 2010-11-10 DIAGNOSIS — D538 Other specified nutritional anemias: Secondary | ICD-10-CM

## 2010-11-10 DIAGNOSIS — E785 Hyperlipidemia, unspecified: Secondary | ICD-10-CM

## 2010-11-10 LAB — HEPATIC FUNCTION PANEL
AST: 23 U/L (ref 0–37)
Albumin: 3.8 g/dL (ref 3.5–5.2)
Alkaline Phosphatase: 80 U/L (ref 39–117)
Total Bilirubin: 0.7 mg/dL (ref 0.3–1.2)

## 2010-11-10 LAB — B12 AND FOLATE PANEL
Folate: 19.7 ng/mL (ref >5.9)
Vitamin B-12: 245 pg/mL (ref 211–911)

## 2010-11-10 LAB — CBC WITH DIFFERENTIAL/PLATELET
Basophils Absolute: 0 10*3/uL (ref 0.0–0.1)
Hemoglobin: 14.1 g/dL (ref 13.0–17.0)
Lymphocytes Relative: 34 % (ref 12.0–46.0)
Monocytes Relative: 8.1 % (ref 3.0–12.0)
Neutro Abs: 4.8 10*3/uL (ref 1.4–7.7)
RBC: 5.08 Mil/uL (ref 4.22–5.81)
RDW: 15.6 % — ABNORMAL HIGH (ref 11.5–14.6)
WBC: 8.8 10*3/uL (ref 4.5–10.5)

## 2010-11-10 LAB — BASIC METABOLIC PANEL
Calcium: 9.1 mg/dL (ref 8.4–10.5)
GFR: 93.24 mL/min (ref >60.00)
Glucose, Bld: 80 mg/dL (ref 70–99)
Sodium: 140 mEq/L (ref 135–145)

## 2010-11-10 LAB — LIPID PANEL
HDL: 42.5 mg/dL (ref >39.00)
LDL Cholesterol: 65 mg/dL (ref 0–99)
Total CHOL/HDL Ratio: 3
VLDL: 12.6 mg/dL (ref 0.0–40.0)

## 2010-11-11 ENCOUNTER — Telehealth: Payer: Self-pay | Admitting: Internal Medicine

## 2010-11-16 NOTE — Letter (Signed)
Summary: Lipid Letter  [facility]  [address] [place], [place] [address]   Phone: [phone]  Fax: [phone]    11/10/2010  Nicholas Burton [address] [place], [place]  [address]  Dear Nicholas Burton:  We have carefully reviewed your last lipid profile from 11/10/2010 and the results are noted below with a summary of recommendations for lipid management.    Cholesterol:       120     Goal: <200   HDL "good" Cholesterol:   57.84     Goal: >40   LDL "bad" Cholesterol:   65     Goal: <100   Triglycerides:       63.0     Goal: <150    YOUR BLOOD SUGARS ARE TOO HIGH, PLEASE FOLLOW-UP SOON!    TLC Diet (Therapeutic Lifestyle Change): Saturated Fats & Transfatty acids should be kept < 7% of total calories ***Reduce Saturated Fats Polyunstaurated Fat can be up to 10% of total calories Monounsaturated Fat Fat can be up to 20% of total calories Total Fat should be no greater than 25-35% of total calories Carbohydrates should be 50-60% of total calories Protein should be approximately 15% of total calories Fiber should be at least 20-30 grams a day ***Increased fiber may help lower LDL Total Cholesterol should be < 200mg /day Consider adding plant stanol/sterols to diet (example: Benacol spread) ***A higher intake of unsaturated fat may reduce Triglycerides and Increase HDL    Adjunctive Measures (may lower LIPIDS and reduce risk of Heart Attack) include: Aerobic Exercise (20-30 minutes 3-4 times a week) Limit Alcohol Consumption Weight Reduction Aspirin 75-81 mg a day by mouth (if not allergic or contraindicated) Dietary Fiber 20-30 grams a day by mouth     Current Medications: 1)    Vitamin D3 50000 Unit Caps (Cholecalciferol) .... Once weekly 2)    Lantus Solostar 100 Unit/ml Soln (Insulin glargine) .... 20  units sq  daily 3)    Aspirin 81 Mg Tabs (Aspirin) .... Take 1 tablet by mouth once a day 4)    Preservision/lutein  Caps (Multiple vitamins-minerals) .Marland Kitchen..  1am and 1 pm 5)    Omega 3 1200mg   .... Take 1 tablet by mouth once a day 6)    Onetouch Ultra System W/device Kit (Blood glucose monitoring suppl) .... Use as driected 7)    Onetouch Ultra Test  Strp (Glucose blood) .... Use three times a day as directed 8)    Bd Pen Needles Debella 31gx5/16  .... Use as directed 9)    Equate Arthritis Pain  10)    Livalo 2 Mg Tabs (Pitavastatin calcium) .... One by mouth once daily for cholesterol 11)    Tamsulosin Hcl 0.4 Mg Caps (Tamsulosin hcl) .... Take 1 tablet by mouth once a day  If you have any questions, please call. We appreciate being able to work with you.   Sincerely,    [facility] Etta Grandchild MD

## 2010-11-16 NOTE — Assessment & Plan Note (Signed)
Summary: 2 MTH FU/NWS   Vital Signs:  Patient profile:   77 year old male Height:      68 inches Weight:      198.50 pounds BMI:     30.29 O2 Sat:      95 % on Room air Temp:     98.3 degrees F oral Pulse rate:   77 / minute Pulse rhythm:   regular Resp:     16 per minute BP sitting:   118 / 70  (left arm) Cuff size:   regular  Vitals Entered By: Rock Nephew CMA (November 10, 2010 10:25 AM)  Nutrition Counseling: Patient's BMI is greater than 25 and therefore counseled on weight management options.  O2 Flow:  Room air CC: follow-up visit// discus medication, Hypertension Management Is Patient Diabetic? Yes Did you bring your meter with you today? No Pain Assessment Patient in pain? no       Does patient need assistance? Functional Status Self care Ambulation Normal   Primary Care Provider:  Etta Grandchild MD  CC:  follow-up visit// discus medication and Hypertension Management.  History of Present Illness: He returns and tells me that he wants to stop taking Janumet b/c he thinks it is causing side effects (weakness, dizziness,increased hunger,weight gain.) Also, he tells me that his AM fasting BS = 70-80 and he thinks that is too low.  Hypertension History:      He complains of peripheral edema, but denies headache, chest pain, palpitations, dyspnea with exertion, orthopnea, PND, visual symptoms, neurologic problems, syncope, and side effects from treatment.  He notes no problems with any antihypertensive medication side effects.        Positive major cardiovascular risk factors include male age 26 years old or older, diabetes, hyperlipidemia, and hypertension.  Negative major cardiovascular risk factors include negative family history for ischemic heart disease and non-tobacco-user status.        Further assessment for target organ damage reveals no history of ASHD, cardiac end-organ damage (CHF/LVH), stroke/TIA, peripheral vascular disease, renal insufficiency, or  hypertensive retinopathy.     Current Medications (verified): 1)  Vitamin D3 50000 Unit Caps (Cholecalciferol) .... Once Weekly 2)  Lantus Solostar 100 Unit/ml Soln (Insulin Glargine) .... 20  Units Sq  Daily 3)  Aspirin 81 Mg Tabs (Aspirin) .... Take 1 Tablet By Mouth Once A Day 4)  Preservision/lutein  Caps (Multiple Vitamins-Minerals) .Marland Kitchen.. 1am and 1 Pm 5)  Omega 3 1200mg  .... Take 1 Tablet By Mouth Once A Day 6)  Onetouch Ultra System W/device Kit (Blood Glucose Monitoring Suppl) .... Use As Driected 7)  Onetouch Ultra Test  Strp (Glucose Blood) .... Use Three Times A Day As Directed 8)  Janumet 50-1000 Mg Tabs (Sitagliptin-Metformin Hcl) .... One By Mouth Two Times A Day For Diabetes 9)  Bd Pen Needles Sens 31gx5/16 .... Use As Directed 10)  Equate Arthritis Pain 11)  Livalo 2 Mg Tabs (Pitavastatin Calcium) .... One By Mouth Once Daily For Cholesterol 12)  Nascobal 500 Mcg/0.55ml Soln (Cyanocobalamin) .... One Puff in A Nostril Once A Week 13)  Tamsulosin Hcl 0.4 Mg Caps (Tamsulosin Hcl) .... Take 1 Tablet By Mouth Once A Day  Allergies (verified): 1)  ! Percocet 2)  ! Penicillin 3)  ! Ace Inhibitors  Past History:  Past Medical History: Last updated: 11/10/2008 Skin cancer, hx of Diabetes mellitus, type II Osteoarthritis Vitamin D deficiency Hyperlipidemia Hypertension  Past Surgical History: Last updated: 11/10/2008 Inguinal herniorrhaphy Total knee replacement  Cholecystectomy  Family History: Last updated: 11/10/2008 Family History Diabetes 1st degree relative  Social History: Last updated: 11/10/2008 Retired Married Never Smoked Alcohol use-no Drug use-no Regular exercise-no  Risk Factors: Alcohol Use: 0 (05/12/2010) >5 drinks/d w/in last 3 months: no (05/12/2010) Exercise: no (11/10/2008)  Risk Factors: Smoking Status: never (05/12/2010)  Family History: Reviewed history from 11/10/2008 and no changes required. Family History Diabetes 1st degree  relative  Social History: Reviewed history from 11/10/2008 and no changes required. Retired Married Never Smoked Alcohol use-no Drug use-no Regular exercise-no  Review of Systems       The patient complains of weight gain.  The patient denies anorexia, fever, weight loss, chest pain, syncope, dyspnea on exertion, peripheral edema, prolonged cough, headaches, hemoptysis, abdominal pain, hematuria, suspicious skin lesions, difficulty walking, and depression.   Endo:  Complains of weight change; denies cold intolerance, excessive hunger, excessive thirst, excessive urination, heat intolerance, and polyuria. Heme:  Denies abnormal bruising, bleeding, enlarge lymph nodes, fevers, pallor, and skin discoloration.  Physical Exam  General:  alert, well-developed, well-nourished, well-hydrated, appropriate dress, normal appearance, healthy-appearing, and cooperative to examination.   Head:  normocephalic, atraumatic, no abnormalities observed, and no abnormalities palpated.   Mouth:  Oral mucosa and oropharynx without lesions or exudates.  Teeth in good repair. Neck:  supple, full ROM, no masses, no JVD, no cervical lymphadenopathy, and no neck tenderness.   Lungs:  normal respiratory effort, no intercostal retractions, no accessory muscle use, normal breath sounds, no dullness, and no fremitus.   Heart:  Normal rate and regular rhythm. S1 and S2 normal without gallop, murmur, click, rub or other extra sounds. Abdomen:  soft, non-tender, normal bowel sounds, no distention, no masses, no guarding, no rigidity, no rebound tenderness, no hepatomegaly, and no splenomegaly.   Msk:  normal ROM, no joint tenderness, no joint swelling, and no joint warmth.   Pulses:  R and L carotid,radial,femoral,dorsalis pedis and posterior tibial pulses are full and equal bilaterally Extremities:  2+ left pedal edema and 2+ right pedal edema.   Neurologic:  alert & oriented X3, cranial nerves II-XII intact, strength  normal in all extremities, gait normal, and DTRs symmetrical and normal.   Skin:  Intact without suspicious lesions or rashes Cervical Nodes:  No lymphadenopathy noted Psych:  Cognition and judgment appear intact. Alert and cooperative with normal attention span and concentration. No apparent delusions, illusions, hallucinations  Diabetes Management Exam:    Foot Exam (with socks and/or shoes not present):       Sensory-Pinprick/Light touch:          Left medial foot (L-4): normal          Left dorsal foot (L-5): normal          Left lateral foot (S-1): normal          Right medial foot (L-4): normal          Right dorsal foot (L-5): normal          Right lateral foot (S-1): normal       Sensory-Monofilament:          Left foot: normal          Right foot: normal       Inspection:          Left foot: normal          Right foot: normal       Nails:          Left foot:  normal          Right foot: normal   Impression & Recommendations:  Problem # 1:  B12 DEFICIENCY (ICD-266.2) Assessment Unchanged  Orders: TLB-Lipid Panel (80061-LIPID) TLB-BMP (Basic Metabolic Panel-BMET) (80048-METABOL) TLB-CBC Platelet - w/Differential (85025-CBCD) TLB-Hepatic/Liver Function Pnl (80076-HEPATIC) TLB-TSH (Thyroid Stimulating Hormone) (84443-TSH) TLB-BNP (B-Natriuretic Peptide) (83880-BNPR) TLB-A1C / Hgb A1C (Glycohemoglobin) (83036-A1C) TLB-B12 + Folate Pnl (69629_52841-L24/MWN)  Problem # 2:  EDEMA (ICD-782.3) Assessment: Deteriorated  Orders: TLB-Lipid Panel (80061-LIPID) TLB-BMP (Basic Metabolic Panel-BMET) (80048-METABOL) TLB-CBC Platelet - w/Differential (85025-CBCD) TLB-Hepatic/Liver Function Pnl (80076-HEPATIC) TLB-TSH (Thyroid Stimulating Hormone) (84443-TSH) TLB-BNP (B-Natriuretic Peptide) (83880-BNPR) TLB-A1C / Hgb A1C (Glycohemoglobin) (83036-A1C) TLB-B12 + Folate Pnl (02725_36644-I34/VQQ)  Discussed elevation of the legs, use of compression stockings, sodium restiction,  and medication use.   Problem # 3:  HYPERTENSION (ICD-401.9) Assessment: Unchanged  Orders: TLB-Lipid Panel (80061-LIPID) TLB-BMP (Basic Metabolic Panel-BMET) (80048-METABOL) TLB-CBC Platelet - w/Differential (85025-CBCD) TLB-Hepatic/Liver Function Pnl (80076-HEPATIC) TLB-TSH (Thyroid Stimulating Hormone) (84443-TSH) TLB-BNP (B-Natriuretic Peptide) (83880-BNPR) TLB-A1C / Hgb A1C (Glycohemoglobin) (83036-A1C) TLB-B12 + Folate Pnl (59563_87564-P32/RJJ)  BP today: 118/70 Prior BP: 122/70 (09/09/2010)  Prior 10 Yr Risk Heart Disease: 22 % (08/11/2010)  Labs Reviewed: K+: 4.1 (08/11/2010) Creat: : 1.0 (08/11/2010)   Chol: 98 (05/12/2010)   HDL: 33.70 (05/12/2010)   LDL: 54 (05/12/2010)   TG: 50.0 (05/12/2010)  Problem # 4:  DIABETES MELLITUS, TYPE II (ICD-250.00) Assessment: Unchanged  The following medications were removed from the medication list:    Janumet 50-1000 Mg Tabs (Sitagliptin-metformin hcl) ..... One by mouth two times a day for diabetes His updated medication list for this problem includes:    Lantus Solostar 100 Unit/ml Soln (Insulin glargine) .Marland Kitchen... 20  units sq  daily    Aspirin 81 Mg Tabs (Aspirin) .Marland Kitchen... Take 1 tablet by mouth once a day  Orders: TLB-Lipid Panel (80061-LIPID) TLB-BMP (Basic Metabolic Panel-BMET) (80048-METABOL) TLB-CBC Platelet - w/Differential (85025-CBCD) TLB-Hepatic/Liver Function Pnl (80076-HEPATIC) TLB-TSH (Thyroid Stimulating Hormone) (84443-TSH) TLB-BNP (B-Natriuretic Peptide) (83880-BNPR) TLB-A1C / Hgb A1C (Glycohemoglobin) (83036-A1C) TLB-B12 + Folate Pnl (88416_60630-Z60/FUX)  Labs Reviewed: Creat: 1.0 (08/11/2010)     Last Eye Exam: normal (10/27/2010) Reviewed HgBA1c results: 8.0 (08/11/2010)  8.1 (05/12/2010)  Complete Medication List: 1)  Vitamin D3 50000 Unit Caps (Cholecalciferol) .... Once weekly 2)  Lantus Solostar 100 Unit/ml Soln (Insulin glargine) .... 20  units sq  daily 3)  Aspirin 81 Mg Tabs (Aspirin) .... Take  1 tablet by mouth once a day 4)  Preservision/lutein Caps (Multiple vitamins-minerals) .Marland Kitchen.. 1am and 1 pm 5)  Omega 3 1200mg   .... Take 1 tablet by mouth once a day 6)  Onetouch Ultra System W/device Kit (Blood glucose monitoring suppl) .... Use as driected 7)  Onetouch Ultra Test Strp (Glucose blood) .... Use three times a day as directed 8)  Bd Pen Needles Bunner 31gx5/16  .... Use as directed 9)  Equate Arthritis Pain  10)  Livalo 2 Mg Tabs (Pitavastatin calcium) .... One by mouth once daily for cholesterol 11)  Tamsulosin Hcl 0.4 Mg Caps (Tamsulosin hcl) .... Take 1 tablet by mouth once a day  Hypertension Assessment/Plan:      The patient's hypertensive risk group is category C: Target organ damage and/or diabetes.  His calculated 10 year risk of coronary heart disease is 22 %.  Today's blood pressure is 118/70.  His blood pressure goal is < 130/80.  Patient Instructions: 1)  Please schedule a follow-up appointment in 2 months. 2)  It is important that you exercise regularly  at least 20 minutes 5 times a week. If you develop chest pain, have severe difficulty breathing, or feel very tired , stop exercising immediately and seek medical attention. 3)  You need to lose weight. Consider a lower calorie diet and regular exercise.  4)  Check your blood sugars regularly. If your readings are usually above 200 or below 70 you should contact our office. 5)  It is important that your Diabetic A1c level is checked every 3 months. 6)  See your eye doctor yearly to check for diabetic eye damage. 7)  Check your feet each night for sore areas, calluses or signs of infection. 8)  Check your Blood Pressure regularly. If it is above 130/80: you should make an appointment.   Orders Added: 1)  TLB-Lipid Panel [80061-LIPID] 2)  TLB-BMP (Basic Metabolic Panel-BMET) [80048-METABOL] 3)  TLB-CBC Platelet - w/Differential [85025-CBCD] 4)  TLB-Hepatic/Liver Function Pnl [80076-HEPATIC] 5)  TLB-TSH (Thyroid  Stimulating Hormone) [84443-TSH] 6)  TLB-BNP (B-Natriuretic Peptide) [83880-BNPR] 7)  TLB-A1C / Hgb A1C (Glycohemoglobin) [83036-A1C] 8)  TLB-B12 + Folate Pnl [82746_82607-B12/FOL] 9)  Est. Patient Level IV [04540]

## 2010-11-16 NOTE — Progress Notes (Signed)
Summary: Medication  Phone Note Call from Patient   Caller: Patient Summary of Call: Patient would like to know what  MD suggest regarding  Lantus dosage of 20units based of off new lab results. Please advise thanks.Alvy Beal Archie CMA  November 11, 2010 8:32 AM   Follow-up for Phone Call        lantus 50 units subcutaneously once daily  Follow-up by: Etta Grandchild MD,  November 11, 2010 8:38 AM  Additional Follow-up for Phone Call Additional follow up Details #1::        Patient notified per MD and will continue to test CBG's and follow up in a week.Alvy Beal Archie CMA  November 11, 2010 10:05 AM

## 2010-11-22 ENCOUNTER — Ambulatory Visit (INDEPENDENT_AMBULATORY_CARE_PROVIDER_SITE_OTHER): Payer: Medicare Other | Admitting: Internal Medicine

## 2010-11-22 ENCOUNTER — Encounter: Payer: Self-pay | Admitting: Internal Medicine

## 2010-11-22 VITALS — BP 130/68 | HR 72 | Temp 98.1°F | Ht 68.0 in | Wt 200.0 lb

## 2010-11-22 DIAGNOSIS — I10 Essential (primary) hypertension: Secondary | ICD-10-CM

## 2010-11-22 DIAGNOSIS — R609 Edema, unspecified: Secondary | ICD-10-CM

## 2010-11-22 DIAGNOSIS — E119 Type 2 diabetes mellitus without complications: Secondary | ICD-10-CM

## 2010-11-22 DIAGNOSIS — E785 Hyperlipidemia, unspecified: Secondary | ICD-10-CM

## 2010-11-22 MED ORDER — SAXAGLIPTIN HCL 5 MG PO TABS: 5.0000 mg | ORAL_TABLET | Freq: Every day | ORAL | Status: DC

## 2010-11-22 NOTE — Assessment & Plan Note (Signed)
This is not well controlled therefore will start onglyza and increase lantus dose

## 2010-11-22 NOTE — Patient Instructions (Signed)
Diabetes, Type 2 Diabetes is a lasting (chronic) disease. In type 2 diabetes, the pancreas does not make enough insulin (a hormone), and the body does not respond normally to the insulin that is made. This type of diabetes was also previously called adult onset diabetes. About 90% of all those who have diabetes have type 2. It usually occurs after the age of 77 but can occur at any age. CAUSES Unlike type 1 diabetes, which happens because insulin is no longer being made, type 2 diabetes happens because the body is making less insulin and has trouble using the insulin properly. SYMPTOMS  Drinking more than usual.   Urinating more than usual.   Blurred vision.   Dry, itchy skin.   Frequent infection like yeast infections in women.   More tired than usual (fatigue).  TREATMENT  Healthy eating.   Exercise.   Medication, if needed.   Monitoring blood glucose (sugar).   Seeing your caregiver regularly.  HOME CARE INSTRUCTIONS  Check your blood glucose (sugar) at least once daily. More frequent monitoring may be necessary, depending on your medications and on how well your diabetes is controlled. Your caregiver will advise you.   Take your medicine as directed by your caregiver.   Do not smoke.   Make wise food choices. Ask your caregiver for information. Weight loss can improve your diabetes.   Learn about low blood glucose (hypoglycemia) and how to treat it.   Get your eyes checked regularly.   Have a yearly physical exam. Have your blood pressure checked. Get your blood and urine tested.   Wear a pendant or bracelet saying that you have diabetes.   Check your feet every night for sores. Let your caregiver know if you have sores that are not healing.  SEEK MEDICAL CARE IF:  You are having problems keeping your blood glucose at target range.   You feel you might be having problems with your medicines.   You have symptoms of an illness that is not improving after 24  hours.   You have a sore or wound that is not healing.   You notice a change in vision or a new problem with your vision.   You develop a fever of more than 100.  Document Released: 08/15/2005 Document Re-Released: 09/06/2009 [facility] Patient Information 2011 [place], [place].

## 2010-11-22 NOTE — Assessment & Plan Note (Signed)
This is doing well 

## 2010-11-22 NOTE — Assessment & Plan Note (Signed)
He is doing well on Livalo, no side effects and a very good response

## 2010-11-22 NOTE — Progress Notes (Signed)
Subjective:    Patient ID: Nicholas Burton, male    DOB: 12-24-1927, 77 y.o.   MRN: [id number]  Diabetes He presents for his follow-up diabetic visit. He has type 2 diabetes mellitus. No MedicAlert identification noted. His disease course has been worsening. There are no hypoglycemic associated symptoms. Pertinent negatives for hypoglycemia include no confusion, dizziness or tremors. Pertinent negatives for diabetes include no blurred vision, no chest pain, no fatigue, no foot paresthesias, no foot ulcerations, no polydipsia, no polyphagia, no polyuria, no visual change, no weakness and no weight loss. There are no hypoglycemic complications. Symptoms are stable. There are no diabetic complications. Current diabetic treatment includes insulin injections. He is compliant with treatment all of the time. He is currently taking insulin at bedtime. His weight is stable. He is following a generally healthy diet. He has not had a previous visit with a dietician. He participates in exercise intermittently. His home blood glucose trend is increasing steadily. His breakfast blood glucose range is generally 70-90 mg/dl. His lunch blood glucose range is generally 130-140 mg/dl. His highest blood glucose is 180-200 mg/dl. An ACE inhibitor/angiotensin II receptor blocker is contraindicated.      Review of Systems  Constitutional: Negative for fever, chills, weight loss, diaphoresis, activity change, appetite change, fatigue and unexpected weight change.  HENT: Negative for facial swelling and neck pain.   Eyes: Negative for blurred vision.  Respiratory: Negative for cough, chest tightness, shortness of breath, wheezing and stridor.   Cardiovascular: Negative for chest pain, palpitations and leg swelling.  Gastrointestinal: Negative for vomiting, abdominal pain, diarrhea and constipation.  Genitourinary: Negative for dysuria, polyuria and frequency.  Musculoskeletal: Negative for back pain, arthralgias and gait  problem.  Neurological: Negative for dizziness, tremors, weakness and numbness.  Hematological: Negative for polydipsia, polyphagia and adenopathy. Does not bruise/bleed easily.  Psychiatric/Behavioral: Negative for behavioral problems, confusion, dysphoric mood, decreased concentration and agitation.   Lab Results  Component Value Date   HGBA1C 8.2* 11/10/2010   Lab Results  Component Value Date   CHOL 120 11/10/2010   CHOL 98 05/12/2010   CHOL 163 01/07/2010   Lab Results  Component Value Date   HDL 42.50 11/10/2010   HDL 19.14* 05/12/2010   HDL 44.60 01/07/2010   Lab Results  Component Value Date   LDLCALC 65 11/10/2010   LDLCALC 54 05/12/2010   LDLCALC 109* 01/07/2010   Lab Results  Component Value Date   TRIG 63.0 11/10/2010   TRIG 50.0 05/12/2010   TRIG 49.0 01/07/2010   Lab Results  Component Value Date   CHOLHDL 3 11/10/2010   CHOLHDL 3 05/12/2010   CHOLHDL 4 01/07/2010   Lab Results  Component Value Date   CREATININE 0.8 11/10/2010      Objective:   Physical Exam  Constitutional: He is oriented to person, place, and time. He appears well-developed and well-nourished. No distress.  HENT:  Head: Normocephalic and atraumatic.  Right Ear: External ear normal.  Left Ear: External ear normal.  Nose: Nose normal.  Mouth/Throat: Oropharynx is clear and moist.  Eyes: Conjunctivae and EOM are normal. Pupils are equal, round, and reactive to light. Right eye exhibits no discharge. Left eye exhibits no discharge. No scleral icterus.  Neck: No thyromegaly present.  Cardiovascular: Normal rate, regular rhythm, normal heart sounds and intact distal pulses.  Exam reveals no gallop and no friction rub.   No murmur heard. Pulmonary/Chest: Effort normal and breath sounds normal. No respiratory distress. He has no wheezes. He  has no rales. He exhibits no tenderness.  Abdominal: He exhibits no distension and no mass. There is no tenderness. There is no rebound and no guarding.    Musculoskeletal: Normal range of motion. He exhibits edema (1+ edema in both legs). He exhibits no tenderness.       Right lower leg: He exhibits edema.       Left lower leg: He exhibits edema.  Lymphadenopathy:    He has no cervical adenopathy.  Neurological: He is alert and oriented to person, place, and time. He has normal reflexes. He displays normal reflexes. No cranial nerve deficit. He exhibits normal muscle tone. Coordination normal.  Skin: Skin is warm and dry. No rash noted. He is not diaphoretic. No erythema. No pallor.  Psychiatric: He has a normal mood and affect. His behavior is normal. Judgment and thought content normal.          Assessment & Plan:

## 2010-12-07 ENCOUNTER — Other Ambulatory Visit: Payer: Self-pay | Admitting: Internal Medicine

## 2010-12-07 DIAGNOSIS — E119 Type 2 diabetes mellitus without complications: Secondary | ICD-10-CM

## 2011-01-12 ENCOUNTER — Ambulatory Visit: Payer: Medicare Other | Admitting: Internal Medicine

## 2011-01-13 ENCOUNTER — Encounter: Payer: Self-pay | Admitting: Internal Medicine

## 2011-01-14 NOTE — H&P (Signed)
NAME:  Nicholas Burton, Nicholas Burton                            ACCOUNT NO.:  [id number]   MEDICAL RECORD NO.:  [id number]                   PATIENT TYPE:  INP   LOCATION:  0480                                 FACILITY:  [facility]   PHYSICIAN:  Ollen Gross, M.D.                 DATE OF BIRTH:  03-Aug-1928   DATE OF ADMISSION:  06/25/2003  DATE OF DISCHARGE:  06/30/2003                                HISTORY & PHYSICAL   CHIEF COMPLAINT:  Right knee reimplantation.   HISTORY OF PRESENT ILLNESS:  The patient is a 77 year old male, well known  to Dr. Ollen Gross, having previously undergone a resection arthroplasty  of a right total knee back in September of 2004 due to a loosening right  knee arthroplasty, which was felt to be due to infection. He has undergone 6  weeks of IV antibiotics and now presents to have reimplantation of the knee  procedure. He has not had any recent fever, chills, or night sweats. He has  been doing very well and has tolerated his antibiotics without difficulty  via his PICC line. He has now reached a point where we would like to  reimplant the knee.   ALLERGIES:  PENICILLIN (causes swelling and rash) and PERCOCET (causes  nausea and vomiting.   CURRENT MEDICATIONS:  Metformin 500 mg twice a day, Actos 45 mg daily,  Glipizide ER 10 mg twice a day, aspirin stopped prior to surgery,  hydrocodone p.r.n. pain and he has also been on IV Vancomycin.   PAST MEDICAL HISTORY:  History of pneumonia in the 1990's, noninsulin-  dependent diabetes mellitus, history of melanoma.   PAST SURGICAL HISTORY:  Left thumb reattachment in 1957. Left arm surgery  for malignant skin melanoma in 2002. Arthroscopic left knee surgery in 2000.  Excision of basal skin cell, basal cell excision neck 2003, right total knee  replacement arthroplasty October 2003. Inguinal hernia repair December 2003.  Incision and drainage bursectomy right total knee May of 2004. He has also  undergone 2 closed  manipulations of the right total knee and recently,  resection of right total knee prosthesis September of 2004.   SOCIAL HISTORY:  Married. Retired from [employer]. [employer]. Denies use of tobacco  products. Very seldom intake of alcohol. The patient has 4 sons. His wife  will be assisting with his care after surgery. He has 2 steps entering in  the front of his house and 3 steps entering in the back patio.   FAMILY HISTORY:  Father deceased age 53 with history of heart disease.  Mother deceased age 25 with history of heart disease and diabetes. He has a  brother who is deceased at age 55 with history of brain cancer. A sister  deceased at age 79. Another sister with a history of cancer, age 30.   REVIEW OF SYSTEMS:  GENERAL:  No fever, chills, night sweats. NEUROLOGIC:  No seizures, syncope or paralysis. RESPIRATORY:  No shortness of breath,  productive cough, or hemoptysis. CARDIOVASCULAR:  No chest pain, angina or  orthopnea. GASTROINTESTINAL:  No nausea and vomiting, diarrhea or  constipation. GENITOURINARY:  He has had some occasional frequency and  nocturia. No dysuria, hematuria or discharge. MUSCULOSKELETAL:  Pertinent to  that of the right knee found in the history of present illness.   PHYSICAL EXAMINATION:  VITAL SIGNS:  Pulse 88, respiratory rate 12, blood  pressure 117/78.  GENERAL:  The patient is a 77 year old white male, well nourished, well  developed in no acute distress. He is alert and oriented and cooperative.  HEENT:  Normocephalic and atraumatic. Pupils are equal, round, and reactive.  Extraocular muscles intact. The patient is noted to wear glasses. He does  have lower partial dentures.  NECK:  Supple.  No carotid bruits.  CHEST:  Clear to auscultation anterior and posterior chest walls. No  rhonchi, rales, or wheezing.  HEART:  Regular rate and rhythm. No murmurs.  ABDOMEN:  Soft, nontender. Bowel sounds present.  RECTAL/BREAST/GENITALIA:  Not done, not pertinent to  present illness.  EXTREMITIES:  Significant to the right knee. The anterior knee incision  looks good. No signs of infection. No erythema. Knee immobilizer is in  place. Motor function is intact. Moves foot and toes well on examination.   IMPRESSION:  1. Resection arthroplasty of right total knee.  2. Diabetes mellitus.  3. History of skin melanoma.   PLAN:  The patient will be admitted to [facility] to  undergo a reimplantation procedure of the right total knee prosthesis.  Surgery will be performed by Dr. Ollen Gross.     Alexzandrew L. Julien Girt, P.A.              Ollen Gross, M.D.    ALP/MEDQ  D:  06/30/2003  T:  06/30/2003  Job:  [id number]

## 2011-01-14 NOTE — Op Note (Signed)
   NAME:  Nicholas Burton, Nicholas Burton                            ACCOUNT NO.:  [id number]   MEDICAL RECORD NO.:  [id number]                   PATIENT TYPE:  INP   LOCATION:  2550                                 FACILITY:  [facility]   PHYSICIAN:  Edwin Cap. Zoila Shutter, M.D.          DATE OF BIRTH:  November 01, 1927   DATE OF PROCEDURE:  06/13/2002  DATE OF DISCHARGE:                                 OPERATIVE REPORT   HISTORY:  The patient is a 77 year old white male with a diagnosis of severe  degenerative joint disease, scheduled for a total knee arthroplasty and for  whom epidural analgesia has been requested, explained, understood, and  accepted for the postoperative period.   DESCRIPTION OF PROCEDURE:  At the termination of surgery while still under  general anesthesia, the patient is turned to the left lateral decubitus  position.  His back is prepped with Betadine and draped in the usual sterile  fashion.  Using  midline approach and loss of resistance technique, a  peridural tap was accomplished at the L1-2 interspace with a 17-gauge Tuohy  needle.  After aspiration for blood and CSF was negative, a total of 100 mcg  of fentanyl and 5 cc of 1% lidocaine were injected with no problems.  This  was followed by the passage of a peridural catheter 10 cm cephalad.  The  catheter was then checked for patency and affixed to the patient's back.  The patient was then returned to the supine position, awakened, and brought  to the postanesthesia care unit.  His peridural catheter will be connected  to an infusion pump with a mixture of fentanyl 5 mcg/cc and Marcaine 1/16%  at an initial rate of 10 ml/hr., to be adjusted as required.  There were no  complications.  He did well and will be followed in the usual fashion.                                               Edwin Cap. Zoila Shutter, M.D.    Lucienne Capers  D:  06/13/2002  T:  06/13/2002  Job:  [id number]

## 2011-01-14 NOTE — Op Note (Signed)
NAME:  Nicholas Burton, Nicholas Burton                            ACCOUNT NO.:  [id number]   MEDICAL RECORD NO.:  [id number]                   PATIENT TYPE:  AMB   LOCATION:  ENDO                                 FACILITY:  [facility]   PHYSICIAN:  Anselmo Rod, M.D.               DATE OF BIRTH:  25-Feb-1928   DATE OF PROCEDURE:  02/17/2003  DATE OF DISCHARGE:                                 OPERATIVE REPORT   PROCEDURE PERFORMED:  Screening colonoscopy.   ENDOSCOPIST:  Anselmo Rod, M.D.   INSTRUMENT USED:  Olympus video colonoscope.   INDICATIONS FOR PROCEDURE:  A 77 year old white male undergoing screening  colonoscopy to rule out colonic polyps, masses, etc.   PREPROCEDURE PREPARATION:  Informed consent was procured from the patient.  The patient was fasted for eight hours prior to the procedure and prepped  with a bottle of magnesium citrate and a gallon of GoLYTELY the night prior  to the procedure.   PREPROCEDURE PHYSICAL:  VITAL SIGNS:  The patient had stable vital signs.  NECK:  Supple.  CHEST: Clear to auscultation.  S1 and S2 regular.  ABDOMEN: Soft with normal bowel sounds.   DESCRIPTION OF PROCEDURE:  The patient was placed in the left lateral  decubitus position, sedated with 50 mg of Demerol and 5 mg of Versed  intravenously.  Once the patient was adequately sedated and maintained on  low flow oxygen and continuous cardiac monitoring, the Olympus video  colonoscope was advanced from the rectum to the cecum.  The appendiceal  orifice and the ileocecal valve were clearly visualized and photographed.  No masses or polyps were noted.  There was evidence of scattered  diverticulosis throughout the colon.  Retroflexion in the rectum revealed no  abnormalities.  The patient tolerated the procedure well without  complications.   IMPRESSION:  1. Normal colonoscopy except for a few scattered diverticula throughout the     colon.  2. No masses or polyps seen.   RECOMMENDATIONS:  1.  High fiber diet with liberal fluid intake has been advocated, 20-25 g of     fiber in the diet had been advised.  2.     Repeat colorectal cancer screening is recommended in the next 10 years     unless the patient develops any abnormal symptoms in the interim.  3. Outpatient follow up on a p.r.n. basis.                                                 Anselmo Rod, M.D.    JNM/MEDQ  D:  02/17/2003  T:  02/17/2003  Job:  [id number]   cc:   Reuben Likes, M.D.  [address]  [place]  [place] [address]  Fax:  [phone]  

## 2011-01-14 NOTE — Op Note (Signed)
NAME:  Nicholas Burton, Nicholas Burton                            ACCOUNT NO.:  [id number]   MEDICAL RECORD NO.:  [id number]                   PATIENT TYPE:  OBV   LOCATION:  0371                                 FACILITY:  [facility]   PHYSICIAN:  Currie Paris, M.D.           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   OPERATION:  Laparoscopic cholecystectomy with operative cholangiogram.   SURGEON:  Currie Paris, M.D.   ASSISTANT:  Lebron Conners, M.D.   ANESTHESIA:  General endotracheal anesthesia.   CLINICAL HISTORY:  This patient is a gentleman who I have taken care of in  the past for melanoma who, on metastatic workup, was found to have what  appeared to be a calcified gallbladder or a gallbladder filled with a stone.  He was having some symptoms and we elected to proceed to cholecystectomy.   DESCRIPTION OF PROCEDURE:  The patient was seen in the holding area and had  no further questions.  He was taken to the operating room and after  satisfactory general anesthesia had been obtained, the abdomen was prepped  and draped.  0.25% plain Marcaine was used for each incision.  An  infraumbilical incision was made, the fascia opened, and the peritoneal  cavity entered under direct vision.  A purse-string was placed, the Hasson  introduced, and the abdomen was insufflated to 15.  The camera was placed  and I could see what appeared to be a chronically inflamed gallbladder that  was probably filled with stone.  In addition, we looked down at the right  inguinal area because he was thought to have a recurrent hernia and, indeed,  could see what appeared to be a recurrent right inguinal hernia.  With the  patient  placed in reversed Trendelenburg and tilted to the left, a 10 mm  trocar was placed in the epigastrium and two 5s laterally.  The gallbladder  was retracted over the liver and we had a fair amount of difficulty  retracting it because it was so firm and, again, appeared to be a  gallbladder basically contracted around either multiple stones or a single  large stone.  However, with gentle work, I was able to dissect out the  cystic duct and clip it as well as the artery.  The cystic duct was opened  and a Cook catheter introduced percutaneously and operative cholangiography  done.  This basically appeared to be normal with filling of the duodenum,  common duct, and hepatic radicals.  The catheter was removed and three clips  placed on the stay side of the duct and it was divided.  The cystic artery  was clipped and divided.  The gallbladder was removed from below to above  with small arterial branches clipped as they were encountered.  The  gallbladder did spill a little bit of bile and we ended up putting it into a  bag once it was disconnected.  I irrigated and made sure everything appeared  to be dry and then brought the bag out the umbilical port.  I had to  considerably enlarge the umbilical port in order to get the gallbladder out  as the stone was quite large.  The abdomen was reinsufflated, we made a  final check for hemostasis  and everything, again, appeared to be dry.  The abdomen was desufflated and  the umbilical port closed with several sutures of 0 Novafil.  The skin was  closed with 4-0 Monocryl subcuticular and Dermabond.  The patient tolerated  the procedure well.  There were no operative complications.  All counts were  correct.                                               Currie Paris, M.D.    CJS/MEDQ  D:  05/05/2004  T:  05/05/2004  Job:  [id number]   cc:   Pierce Crane, M.D.  [address] [facility]  [place]  [place] [address]  Fax: [phone]   Reuben Likes,  M.D.  [address]  [place]  [place] [address]  Fax: [phone]

## 2011-01-14 NOTE — Discharge Summary (Signed)
NAME:  Nicholas Burton, Nicholas Burton                            ACCOUNT NO.:  [id number]   MEDICAL RECORD NO.:  [id number]                   PATIENT TYPE:  INP   LOCATION:  0455                                 FACILITY:  [facility]   PHYSICIAN:  Ollen Gross, M.D.                 DATE OF BIRTH:  1928/05/26   DATE OF ADMISSION:  05/14/2003  DATE OF DISCHARGE:  05/19/2003                                 DISCHARGE SUMMARY   ADMITTING DIAGNOSES:  1. Loosening right total knee arthroplasty.  2. Diabetes mellitus.  3. History of skin melanoma.   DISCHARGE DIAGNOSES:  1. Infected right total knee arthroplasty status post right knee resection     arthroplasty with antibiotic cement spacer.  2. Mild postoperative blood loss anemia.  3. Mild hyponatremia.   CONSULTS:  Infectious disease, Rockey Situ. Roxan Hockey, M.D.   BRIEF HISTORY:  The patient is a 77 year old male who had a right total knee  arthroplasty done approximately a year ago.  Has significant stiffness.  Had  a closed manipulation on two different occasions.  He had persistent  stiffness and persistent pain.  After second manipulation developed a bursa  which is ______.  He recently demonstrated some loosening of components  consistent with possible infection.  Presents now for right knee arthrotomy  and either resection arthroplasty or revision depending on intraoperative  findings.   LABORATORY DATA:  CBC on admission showed hemoglobin 13.0, hematocrit 38.7,  white cell count normal at 9.1, red cell count 4.96.  Differential all  within normal limits.  Postoperative H&H 11.6 and 33.6.  Last noted H&H 11.7  and 28.3.  PT/PTT on admission were 12.7 and 42, respectively with an INR of  0.9.  Serial pro times followed.  Last noted PT/INR 23.9 and 3.0.  Chemistry  panel:  Elevated glucose on admission of 255.  Remaining chemistry panel all  within normal limits.  Serial BMETs were followed.  Glucose stayed around  255 and 259.  Calcium went down from  10.3 to 7.9.  Sodium dropped from 140  to 134.  Vancomycin trough taken on May 18, 2003 7.1.  Urinalysis  positive for glucose, otherwise negative.  The stat Gram stain x2 taken at  the day of surgery Gram stain #1 showed no organisms, only a few wbc's.  Gram stain #2 showed no organisms, only a few wbc's.  Wound culture #1 taken  at the time of surgery showed a few wbc's, no organisms, no growth after 10  days.  Wound culture 2 showed few wbc's, no squamous epithelial cells, no  organisms, no growth after 10 days.  Anaerobic culture #1 few wbc's, no  organisms, no anaerobes isolated.  Anaerobic culture #2 few wbc's, no  organisms, no anaerobes isolated.   EKG dated May 12, 2003:  Normal sinus rhythm.  Normal EKG.  Confirmed  by Willa Rough, M.D.  Ultrasound  used on May 15, 2003:  Technically  successful right arm PICC placement with ultrasound and fluoroscopic  guidance.   HOSPITAL COURSE:  The patient was admitted to [facility], taken to  the OR, and underwent the above stated procedure.  Due to intraoperative  findings, patient underwent resection arthroplasty with placement of the  antibiotic cement spacer.  The initial stat Gram stains did not show any  organisms but clinically patient appeared to have an infected total knee and  was treated accordingly so.  He was later transferred to recovery room and  then to orthopedic floor for continued postoperative care.  Infectious  disease consult was called.  The patient was seen in consultation by Rockey Situ. Roxan Hockey, M.D. and recommended IV vancomycin via PICC line.  PICC line  was ordered and placed postoperatively.  He was placed on sliding scale due  to his diabetes.  He was on vancomycin protocol per pharmacy.  Placed on  Coumadin.  Home medications were restarted.  Due to having an antibiotic  cement spacer placed, there were no exercises or range of motion done on the  right knee.  He did undergo PT  and OT just for gait training and ADLs.  He  was allowed to be partial weightbearing 25-50%.  He did have Hemovac drain  placed at time of surgery.  This was pulled on postoperative day two.  He  was initially placed on p.o. and PC analgesic for pain control following  surgery.  Was later weaned over to p.o. medications.  Again, no motion was  done to the knee.  From a therapy standpoint from his mobility he actually  did extremely well.  He was up ambulating approximately 175 feet by day two  and then over 200 feet by day three and day four.  The patient's vancomycin  treatment was regulated by the pharmacy.  He did have some mild fluid  balance postoperatively, but diuresed his fluid on his own very well.  He  continued to be followed throughout the hospital course with wound checks.  The wound was healing well with daily checks.  By May 19, 2003 the  cultures were still negative by the end of the hospital course, but again,  due to the obvious findings in the operating room he was treated accordingly  for infection.  Home arrangements were made for IV vancomycin.  Once  arrangements had been made he was allowed to be discharged home.   DISCHARGE PLAN:  The patient discharged home on May 19, 2003.   DISCHARGE DIAGNOSES:  Please see above.   DISCHARGE MEDICATIONS:  1. IV vancomycin protocol.  2. Trinsicon.  3. Coumadin.  4. Vicodin.  5. Robaxin.   DIET:  Diabetic diet.   FOLLOWUP:  Two weeks from surgery.   ACTIVITY:  He needs to have the knee immobilizer on at all times.  He is  allowed to be partial weightbearing 25-50%, up at lib.  He may start  showering five days after surgery if there is no drainage.    DISPOSITION:  Home.   CONDITION ON DISCHARGE:  Improving.     Nicholas Burton, P.A.              Ollen Gross, M.D.    ALP/MEDQ  D:  07/09/2003  T:  07/09/2003  Job:  [id number]   cc:   Rockey Situ. Flavia Shipper., M.D. [address]   [place]  [place] [address]  Fax: [phone]  Harden Mo, M.D.  [address]  [place]  [place] [address]  Fax: [phone]

## 2011-01-14 NOTE — Op Note (Signed)
**Note Nicholas Burton** [name], [name]                  ACCOUNT NO.:  [id number]   MEDICAL RECORD NO.:  [id number]          PATIENT TYPE:  OIB   LOCATION:  2899                         FACILITY:  [facility]   PHYSICIAN:  Sharlet Salina T. Hoxworth, M.D.DATE OF BIRTH:  1928-01-24   DATE OF PROCEDURE:  10/01/2004  DATE OF DISCHARGE:                                 OPERATIVE REPORT   PREOPERATIVE DIAGNOSIS:  Recurrent right inguinal hernia.   POSTOPERATIVE DIAGNOSIS:  Recurrent right inguinal hernia.   PROCEDURE:  Laparoscopic repair of recurrent right inguinal hernia.   SURGEON:  Lorne Skeens. Hoxworth, M.D.   ANESTHESIA:  General.   INDICATIONS FOR PROCEDURE:  [name] is a 77 year old male with a history  of a right inguinal hernia that has been repaired on two previous occasions  anteriorly, once with a mesh sheet and a second time with a mesh plug.  He  now presents with a recurrent hernia confirmed on examination which feels to  be somewhat lateral in the inguinal canal.  We have discussed options and  elected to proceed with laparoscopic repair.  The nature of the procedure,  its indications, risks of bleeding, infection, intestinal injury, and  recurrence were discussed and understood.  He is now brought to the  operating room for this procedure.   DESCRIPTION OF PROCEDURE:  The patient was brought to the operating room and  placed in the supine position on the operating table and general anesthesia  was induced.  The patient's lower abdomen, groin, and genitalia were  sterilely prepped and draped.  He was given preoperative antibiotics.  Correct position and site were confirmed.  Local anesthesia was used to  infiltrate the trocar sites.  A 1 cm incision was made just below the  umbilicus and dissection carried down to the anterior fascia.  This was  incised transversely just to the right of the midline and the medial edge of  the right rectus muscle retracted laterally and the preperitoneal space  entered.  The balloon dissector was then passed into this space and along  the midline with its tip to the pubic symphysis and then was inflated under  direct vision with good bilateral deployment of the balloon.  It was left in  place for a couple of minutes for hemostasis and then removed and the  structural balloon trocar placed and CO2 pressure applied.  Laparoscopy  revealed reasonably good dissection of the preperitoneal space on the right.  The midline and pubic symphysis were identified and the pubic symphysis and  Cooper's ligament were cleared down toward the iliac vessels which were  carefully identified and protected.  The epigastric vessels had been  dissected posteriorly and on trying to reflect them more anteriorly, they  really seemed somewhat taut and in the way, possibly from scarring from  previous surgery and for exposure and to allow the mesh to seat well up  against the anterior abdominal wall and divided them between clips.  The  peritoneum was identified laterally and stripped down off the anterior  abdominal wall well out toward the anterior superior iliac  spine and  dissected well posteriorly.  The peritoneum was then followed over medially  and was very adherent around the internal ring.  The peritoneum was  carefully dissected off the cord at this point and there was a mesh plug  apparent right at the internal ring.  As the peritoneum was dissected  posteriorly, the hernia defect was seen which was at the internal ring just  lateral to the plug.  The peritoneum was dissected well down posteriorly off  the cord structures which were identified and protected.  There was no  defect more medial to this.  The peritoneum was again confirmed to be  dissected well posteriorly from the anterior superior iliac spine back  medial to the cord structures and the anterior abdominal wall had been well  cleared.  A large piece of Bard 3-D preformed mesh was used, introduced  into  the preperitoneal space, and oriented.  It was tacked initially medially to  the pubic symphysis and then along Cooper's ligament down to and avoiding  the iliac vessels.  The mesh reached well laterally and was tacked laterally  out toward the anterior superiorly iliac spine, being able to feel each tack  through the anterior abdominal wall and then working back along the superior  edge of the mesh and then the medial portion of the mesh as well as again  being able to feel the tack through the anterior abdominal wall.  This  appeared to provide nice broad coverage of the direct and indirect spaces  and could be seen at the previous defect at the internal ring was well  within the covered area of the mesh.  Following this, the trocar was removed  under direct vision and CO2 evacuated.  The small fascial defect in the  umbilicus was closed with a figure-of-eight suture of 0 Vicryl.  Skin  incisions were closed with interrupted subcuticular 0 Monocryl and Steri-  Strips.  Needle, sponge, and instrument counts correct.  Dry sterile  dressings were applied and the patient was taken to the recovery room in  satisfactory condition.      BTH/MEDQ  D:  10/01/2004  T:  10/01/2004  Job:  [id number]

## 2011-01-14 NOTE — Op Note (Signed)
NAME:  Nicholas Burton, Nicholas Burton                            ACCOUNT NO.:  [id number]   MEDICAL RECORD NO.:  [id number]                   PATIENT TYPE:  OIB   LOCATION:  2889                                 FACILITY:  [facility]   PHYSICIAN:  Gabrielle Dare. Janee Morn, M.D.             DATE OF BIRTH:  26-Jul-1928   DATE OF PROCEDURE:  07/30/2002  DATE OF DISCHARGE:                                 OPERATIVE REPORT   PREOPERATIVE DIAGNOSIS:  Right inguinal hernia.   POSTOPERATIVE DIAGNOSIS:  Right inguinal hernia.   OPERATION PERFORMED:  Right inguinal hernia repair with mesh.   SURGEON:  Gabrielle Dare. Janee Morn, M.D.   ANESTHESIA:  General.   HISTORY OF PRESENT ILLNESS:  The patient is a 77 year old white male who has  a many-year history of a weakness in his right groin, which has likely been  a longstanding right inguinal hernia.  Over the past couple weeks, though,  this has been increasing with the patient noticing a bulge in the area.  This was exacerbated by his therapy after total knee replacement.  He was  diagnosed with a large right inguinal hernia and he presents for elective  repair.   PROCEDURE IN DETAIL:  The patient was brought to the operating room.  Intravenous antibiotics were given.  General anesthesia was administered.  The patient's lower abdomen and groins were prepped and draped in a sterile  fashion.  A right groin incision was made.  Subcutaneous tissues were  dissected down.  Scarpa's fascia was divided and the external oblique fascia  was exposed.  This was incised sharply.  The incision was carried down  through the external ring.  The leaflets of the external oblique were  bluntly dissected from their underlying tissues.  This was done superiorly  to reveal aponeurosis and then inferiorly to reveal the shelving edge of the  inguinal ligament.  The cord structures were encircled.  This dissection  revealed that the patient had an extremely weak inguinal floor with a great  deal of  laxity in his tissues there. Further dissection of the cord  structures revealed a small indirect hernia sac.  This was ligated up near  the internal ring with a 0-Vicryl suture ligature.  Subsequently the direct  component of the hernia was manually reduced and the repair was completed  with a Prolene mesh.  This was secured medially to the pubic tubercle and  then inferiorly along the shelving edge of the inguinal ligament, which was  quite attenuated due to its longstanding large direct hernia.  The Prolene  was secured in a running fashion along the shelving edge.  Subsequently the  superior portion of the mesh was secured with a series of interrupted 2-0  Prolene; two medially through the pubic tubercle and superiorly along the  aponeurosis.  Subsequently the two leaflets of the keyhole mesh were joined  together to reconstruct the ring  and to tack the mesh down laterally.  A  second suture was placed to insure that only a fingertip would be admissible  to this newly reconstructed ring.  Once this was accomplished the area was  copiously irrigated.  Bovie cautery was used for meticulous hemostasis.  The  mesh was noted to be secure in place.  The area was again irrigated and then  the closure was completed as follows.   First 17 cc of a quarter percent Marcaine was infused in the area for some  postoperative anesthesia.  Then the leaflets of the external oblique were  closed with a running 2-0 Vicryl suture.  Scarpa's fascia was closed with a  series of interrupted 3-0 Vicryl sutures and the skin was closed with a  running 4-0 Monocryl subcuticular stitch.  Benzoin and Steri-strips were  placed.   All sponge and needle counts were correct.   The patient tolerated the procedure well without complications and was taken  to the recovery room in good condition.                                                 Gabrielle Dare Janee Morn, M.D.    BET/MEDQ  D:  07/30/2002  T:  07/30/2002   Job:  [id number]

## 2011-01-14 NOTE — H&P (Signed)
NAME:  Nicholas Burton, Nicholas Burton                            ACCOUNT NO.:  [id number]   MEDICAL RECORD NO.:  [id number]                   PATIENT TYPE:  INP   LOCATION:  0455                                 FACILITY:  [facility]   PHYSICIAN:  Ollen Gross, M.D.                 DATE OF BIRTH:  August 28, 1928   DATE OF ADMISSION:  05/14/2003  DATE OF DISCHARGE:                                HISTORY & PHYSICAL   CHIEF COMPLAINT:  Right knee pain.   DATE OF OFFICE VISIT HISTORY AND PHYSICAL:  May 02, 2003.   HISTORY OF PRESENT ILLNESS:  The patient is a 77 year old  male with a prior  history of a right total knee arthroplasty performed approximately 10 months  ago at [facility]. He comes in to Dr. Homero Fellers Aliusio's office for a second  opinion. When reviewing his history, the patient states that he had a  difficult postop course and had a reaction the Percocet which caused severe  nausea and vomiting. After his initial hospital course, he did fairly well  for the first six weeks during his recovery and had his knee flexed up to  about 90 degrees. Unfortunately he developed bilateral hernia repairs and  had to have emergent hernia surgery in December of 2003. He unfortunately  developed some stiffness and had to have a closed manipulation and never  regained his motion adequately. He had a second manipulation performed in  May of 2004. During his physical therapy, he noted a blister developed on  the inferior aspect of his previous incision which opened up and started  draining a small amount. He underwent an I&D on May 27 of the superficial  blister and had it loosely closed allowing it to drain. It eventually healed  but unfortunately the patient has developed progressive pain and swelling  and significant limitation. He feels like he has continued to lose motion  and had a second option obtained by Dr. Tod Persia at [facility] and  felt as though infection was present. Mr. Tauzin wanted to  have his treatment  performed in [facility], therefore, he followed by up with Ollen Gross,  M.D.  He was seen in the office where x-rays were reviewed and felt to have  some significant loosening of the femoral component and it looked like he  may have some bone loss in comparison to the x-rays taken in March earlier  this year. There was also some loosening seen around the tibial component.  Due to the significant history and also the findings on x-rays, it was felt  that the patient likely did have possibility of infection versus aseptic  loosening. It was felt the patient would best be served by undergoing a  possible revision of the total knee versus resection arthroplasty. The risks  and benefits of this big procedure were discussed at length with the patient  and he has  elected to proceed with surgery.   ALLERGIES:  PENICILLIN causes swelling and rash.   INTOLERANCES:  Percocet causes nausea and vomiting.   CURRENT MEDICATIONS:  1. Actos 45 mg daily.  2. Metformin 500 mg, 2 in the a.m., 2 in the p.m.  3. Glipizide ER 10 mg, 2 tablets daily in the a.m.  4. Aspirin 81 mg daily, stop prior to surgery.   PAST MEDICAL HISTORY:  1. History of pneumonia in 1997.  2. Non-insulin dependent diabetes mellitus.  3. History of melanoma.    PAST SURGICAL HISTORY:  1. Left thumb reattachments in 1957.  2. Left arm surgery for malignant skin melanoma in 2002.  3. Right total knee replacement, October 2003.  4. Inguinal hernia repair, December 2003.  5. I&D and bursectomy of right total knee, May 2004.  6. He has also undergone two closed manipulations of the right total knee.   SOCIAL HISTORY:  Married, he is retired from the [employer]. Denies use of  tobacco products, very seldom intake of alcohol. He has four sons, his wife  will be assisting with care after surgery. He has two steps entering into  the front of his house and three steps entering into the back patio.   FAMILY  HISTORY:  Father deceased age 51 with a history of heart disease,  mother deceased age 74 with a history of heart disease and diabetes. He has  a brother who is deceased at age 18 with a history of brain cancer. A sister  deceased age 70, another sister with a history of cancer age 59.   REVIEW OF SYMPTOMS:  GENERAL:  No fever, chills or night sweats. NEUROLOGIC:  No seizure, syncope, paralysis. RESPIRATORY:  No shortness of breath,  productive cough or hemoptysis. CARDIOVASCULAR:  No chest pain, angina,  orthopnea. GI:  No nausea, vomiting, diarrhea or constipation. GU:  Some  occasional urinary frequency. No dysuria, hematuria or discharge.  MUSCULOSKELETAL:  Pertinent to that of the right knee found in the history  of present illness.   PHYSICAL EXAMINATION:  VITAL SIGNS:  Pulse 84, respirations 16, blood  pressure 138/82.  GENERAL:  The patient is a 77 year old, white male, well-nourished, well-  developed in no acute distress, alert and oriented and cooperative, appears  to be a good historian.  HEENT:  Normocephalic, atraumatic. Pupils are round and reactive. Oropharynx  is clear. The patient is noted to ear glasses. EOM's are intact. Neck is  supple. No carotid bruits. He does have lower partial dentures noted.  CHEST:  Clear to auscultation in the anterior and posterior chest walls. No  rhonchi, rales or wheezing.  HEART:  Regular rhythm, no murmurs.  ABDOMEN:  Soft, nontender, bowel sounds are present.  RECTAL/BREASTS/GENITALIA:  Not done not pertinent to present illness.  EXTREMITIES:  Significant to the right lower extremity, he has very limited  range of motion of the right knee with range of motion lacking 30 degrees of  extension with flexion up only to 60 degrees. There is no instability noted  but he does have a fair amount of soft tissue thickening. Previous anterior  knee incision is healed.   IMPRESSION: 1. Loosening right total knee arthroplasty.  2. Diabetes  mellitus.  3. History of skin melanoma.    PLAN:  The patient admitted to [facility] to undergo a revision of  his right total knee arthroplasty versus a resection arthroplasty of the  right total knee, surgery will be performed by  Ollen Gross, M.D.  The  patient's medical doctor is Reuben Likes, M.D.  Dr. Lorenz Coaster will be  notified of room number and admission and be consulted if needed for any  medical assistance with this patient throughout the hospital course.     Alexzandrew L. Julien Girt, P.A.              Ollen Gross, M.D.    ALP/MEDQ  D:  05/14/2003  T:  05/14/2003  Job:  [id number]   cc:   Reuben Likes, M.D.  [address]  [place]  [place] [address]  Fax: [phone]

## 2011-01-14 NOTE — Op Note (Signed)
NAME:  Nicholas Burton, Nicholas Burton                            ACCOUNT NO.:  [id number]   MEDICAL RECORD NO.:  [id number]                   PATIENT TYPE:  INP   LOCATION:  0004                                 [facility]ACILITY[facility]   PHYSICIAN:  Ollen Gross, M.D.                 DATE OF BIRTH:  07-25-1928   DATE OF PROCEDURE:  05/14/2003  DATE OF DISCHARGE:                                 OPERATIVE REPORT   PREOPERATIVE DIAGNOSIS:  Infected right total knee arthroplasty.   POSTOPERATIVE DIAGNOSES:  Infected right total knee arthroplasty.   OPERATION/PROCEDURE:  Right knee resection arthroplasty with antibiotic  spacer placement.   SURGEON:  Ollen Gross, M.D.   ASSISTANT:  Madlyn Frankel. Charlann Boxer M.D..   ANESTHESIA:  General.   ESTIMATED BLOOD LOSS:  Minimal.   DRAINS:  Hemovac x1.   COMPLICATIONS:  None.   DISPOSITION:  Stable to recovery.   BRIEF CLINICAL NOTE:  Mr. Eaves is a 77 year old male who had a right total  knee arthroplasty done approximately a year ago.  He has had significant  stiffness and has had closed manipulation on two occasions.  He has had  persistent stiffness and persistent pain.  At this second manipulation,  apparently he had a bursa develop which drained.  His recent x-rays  demonstrated loosening of both components consistent with possible  infection.  He presents now for right knee arthrotomy with either resection  arthroplasty or revision surgery, depending on interoperative findings.   DESCRIPTION OF PROCEDURE:  After the successful administration of general  anesthesia, a tourniquet was placed on the right thigh and right lower  extremity prepped and draped in the usual sterile fashion.  His preoperative  range of motion was from approximately 40-60 degrees.  I wrapped the  extremity in Esmarch and inflated the tourniquet to 300 mmHg, made a midline  incision and cut through the subcutaneous tissue to the level of the  extensor mechanism.  Fresh blade was used to  make a medial parapatellar  arthrotomy.  We did not encounter any fluid in the joint.  There was a  tremendous amount of thickening scar tissue in the suprapatellar area which  is excised to recreate the medial and lateral femoral gutters.  Then we  excised all the scar from underneath the extensor mechanism inferiorly.  The  patella tendon was left intact.  I elevated the proximal soft tissue over  the proximal and medial tibia and ran into a gross pocket of pus.  This  fluid was sent for stat Gram stain C&S.  The Gram stain eventually showed  neutrophils but no organisms.  We sent this same tissue for frozen section  and it was consistent with infection with acute inflammation throughout the  entire specimen.  Once we had this, I removed the tibial polyethylene and  then removed the tibial component by disrupting the interface between cement  and bone using osteotomes.  We did not have any bone loss upon component  removal.  The femoral component came off extremely easy.  It was a Press-Fit  component which did not grow in.  The fibrous interface tissue was then  debrided and removed.  This tissue was also sent for frozen section and had  acute inflammation.  A thorough debridement of all abnormal-appearing tissue  was undertaken.  Given the fact that this was all consistent with infection,  I decided to place an antibiotic spacer.  First I reamed out both canals up  to 15 mm to clean any debris from the canals.  We then thoroughly irrigated  the entire wound bed and bony surfaces with pulsatile lavage.  Then made an  antibiotic spacer with batches of Palacos cement.  Two grams of vancomycin,  2.4 g of tobramycin.  This is placed in the configuration of a block to  maintain this soft tissue tension.  I placed a flange up superiorly so that  the patella would not stick to the femur.  We also placed cement down into  the tibial canal to get antibiotic coverage there.  Please note that I  also  had to remove the patellar component using oscillating saw and to get the  polyethylene plugs from the patella.  The wound was again irrigated and  extensor mechanism closed over the Hemovac drain with interrupted #1 PDS.  The tourniquet was released for a total time of 64 minutes.  Subcutaneous  tissues were closed with interrupted 2-0 Vicryl, skin with staples.  A bulky  sterile dressing was applied.  He was then awakened and transported to the  recovery room in stable condition.                                               Ollen Gross, M.D.    FA/MEDQ  D:  05/14/2003  T:  05/14/2003  Job:  [id number]

## 2011-01-14 NOTE — Discharge Summary (Signed)
NAME:  Nicholas Burton, Nicholas Burton                            ACCOUNT NO.:  [id number]   MEDICAL RECORD NO.:  [id number]                   PATIENT TYPE:  INP   LOCATION:  5006                                 FACILITY:  [facility]   PHYSICIAN:  Oris Drone. Petrarca, P.A.-C.          DATE OF BIRTH:  [birth year]   DATE OF ADMISSION:  06/13/2002  DATE OF DISCHARGE:  06/18/2002                                 DISCHARGE SUMMARY   ADMISSION DIAGNOSIS:  Advanced degenerative joint disease of the right knee.   DISCHARGE DIAGNOSES:  1. Advanced degenerative joint disease of the right knee.  2. Noninsulin dependent diabetes mellitus.  3. Gastroparesis.   PROCEDURE:  Right total knee replacement.   HISTORY OF PRESENT ILLNESS:  The patient is a 77 year old male with  worsening right knee pain medial greater than lateral. He has developed  worsening varus deformity. Radiographically he had end-stage DJD of the  right knee. He has failed conservative treatment and is now having  pain  with activities of daily living and sleep. He is now indicated for a right  total knee replacement.   HOSPITAL COURSE:  A 77 year old male who was admitted on June 13, 2002,  after appropriate laboratory studies were obtained as well as 1 gm of  vancomycin IV on call to the operating room. He was taken to the operating  room where he underwent a right total knee replacement. He tolerated the  procedure well. He was continued on vancomycin 1 gm  IV every 12 hours for 3  doses. He was started on heparin 5000 units  subcu q.12h. until his Coumadin  became therapeutic. A postoperative epidural was placed for pain management.  Consultations with  physical therapy, occupational therapy and  rehab were  made. He ambulated in PT, weight bearing as tolerated. A CPM was placed from  0 to 30 degrees for 8 hours per day and  incremented by 10 degrees daily. A  consultation with Dr. Leslee Home was also requested.   His medical care was  continued by Dr. Lorenz Coaster, especially in regards to his  insulin dependency during his hospital course. He was  placed on sliding  scale insulin and Lantus.  He became ambulatory without difficulty. He did  develop some gastroparesis and he was  treated with Reglan 5 mg p.o. q. a.c.  This improved. He was  discharged on the 21st in improved condition to  return back to the office in 10 days for staple removal.   An EKG revealed normal sinus rhythm. Radiographic studies of the chest on  April 17, 2002, reveals mild aortic elongation, dilatation and  calcification which is unchanged. No evidence of metastatic disease   LABORATORY DATA:  Preoperatively hemoglobin 14.4. Magnesium 43.1%. White  count 9300, platelets 253,000. Discharge hemoglobin 10.9, hematocrit 32.1%,  white count 9600, platelet 328,000. Chemistries preoperatively, sodium 144,  potassium 5.2, chloride 108, CO2 29, glucose 116,  BUN 16, creatinine 0.9,  calcium 9.8. Total protein 6.5, albumin 3.7, AST 23, ALT 21, ALP 82 and  total bilirubin 0.7. Discharge sodium 141, potassium 3.7, chloride 104, CO2  31, glucose 84, BUN 11, creatinine 0.8, calcium 8.6. A urinalysis was benign  for a voided urine. He was blood type O+, antibody screen negative.   DISCHARGE MEDICATIONS:  1. Norco 5/325 1 to 2 tablets q.4h. p.r.n. pain.  2. Coumadin 5 mg as directed by [facility].  3. Colace 100 mg 1 tab b.i.d. with meals.  4. Iron sulfate 325 mg 1 p.o. b.i.d. with meals.   DISCHARGE INSTRUCTIONS:  Activity as taught in physical therapy. No  restrictions on his diet. Keep his wound  clean and dry and covered with a  dressing. Call us for increased temperature, swelling, drainage.   FOLLOW UP:  He will follow up back in the office in 10 days for staple  removal.   DISPOSITION:  He was discharged in improved condition.                                               Oris Drone Petrarca, P.A.-C.    BDP/MEDQ  D:  07/04/2002  T:   07/05/2002  Job:  [id number]

## 2011-01-14 NOTE — Op Note (Signed)
NAME:  Nicholas Burton, Nicholas Burton                            ACCOUNT NO.:  [id number]   MEDICAL RECORD NO.:  [id number]                   PATIENT TYPE:  AMB   LOCATION:  DSC                                  FACILITY:  [facility]   PHYSICIAN:  Currie Paris, M.D.           DATE OF BIRTH:  1927-09-27   DATE OF PROCEDURE:  04/07/2004  DATE OF DISCHARGE:                                 OPERATIVE REPORT   Office MRN #[id number]   PREOPERATIVE DIAGNOSIS:  Recurrent melanoma at site of prior excision, left  arm.   POSTOPERATIVE DIAGNOSIS:  Recurrent melanoma at site of prior excision, left  arm.   PROCEDURE:  Re-excision of left melanoma with blue dye injection and  axillary sentinel lymph node biopsy (four sentinel nodes and one non-  sentinel node removed).   SURGEON:  Currie Paris, M.D.   ANESTHESIA:  General.   CLINICAL HISTORY:  The patient is a 77 year old gentleman recently found to  have a local recurrence in the almost exact middle of his prior wide local  excision scar on the posterior aspect of his left arm.  Metastatic workup  was negative.  A lymphoscintogram showed that we indeed were able to locate  additional sentinel nodes and he was therefore scheduled for re-excision  with sentinel node biopsy.   DESCRIPTION OF PROCEDURE:  The patient was seen in the holding area and had  no further questions.  The prior melanoma site was marked.  He was already  injected with his radioactive isotope.   He was taken to the operating room and after satisfactory general (LMA)  anesthesia was obtained, the left axillary area was prepped and draped.  Using a Neoprobe, I found a hot area which was about in the mid portion of  the old sentinel biopsy scar, so I reopened that scar.  I divided through  subcutaneous tissues which primarily was scar tissue until I was able to  enter the axilla.  Using the Neoprobe, I identified actually four separate  nodes which all looked primarily fatty  although the fourth one was much  firmer than the others.  There was no blue dye anywhere to be seen in the  axilla.   As I was getting down to this area, I also found a hard node that was more  anterior almost at the level of the pectorals where the others were more  posteriorly located. This was actually felt quite firm, so I excised it and  once it was out it seemed more likely that there was a lot of scar and  fibrosis around this from the prior biopsy with a more normal feeling node.  Nevertheless, it was out, so we sent that for pathology as well.  Everything  was checked for hemostasis.  I injected some Marcaine especially around the  thoracodorsal nerve to try to help with postoperative pain relief and then  closed in  layers with 3-0 Vicryl and 4-0 Monocryl subcuticular plus  Dermabond.   The patient's arm was repositioned so that we could see the back of the arm.  I had already injected the Lymphazurin as noted before and we went ahead and  outlined an incision to get about a cm on either side of the prior repeat  biopsy scar which was actually transversely oriented in the midst of a long  vertical re-excision scar.  I then made a skin incision and went down  through the subcutaneous tissue down to muscle fascia and did a full  thickness excision including fascia.  Bleeding was controlled with the  cautery.  I injected Marcaine again to help with postoperative analgesia and  closed in layers initially with 3-0 Vicryl followed by staples because there  was still some tension on the skin.   The patient tolerated the procedure well and there were no operative  complications.  All counts were correct.                                               Currie Paris, M.D.    CJS/MEDQ  D:  04/07/2004  T:  04/07/2004  Job:  [id number]   cc:   Reuben Likes, M.D.  [address]  [place]  [place] [address]  Fax: [phone]   Pierce Crane, M.D.  [address] [place]   [place]  [place] [address]  Fax: [phone]

## 2011-01-14 NOTE — Op Note (Signed)
NAME:  Nicholas Burton, Nicholas Burton                            ACCOUNT NO.:  [id number]   MEDICAL RECORD NO.:  [id number]                   PATIENT TYPE:  OIB   LOCATION:  2899                                 FACILITY:  [facility]   PHYSICIAN:  Gabrielle Dare. Janee Morn, M.D.             DATE OF BIRTH:  [birth year]   DATE OF PROCEDURE:  10/17/2003  DATE OF DISCHARGE:  10/17/2003                                 OPERATIVE REPORT   PREOPERATIVE DIAGNOSIS:  Recurrent right inguinal hernia.   POSTOPERATIVE DIAGNOSIS:  Recurrent right inguinal hernia.   PROCEDURE:  Repair of recurrent right inguinal hernia.   SURGEON:  Gabrielle Dare. Janee Morn, M.D.   ASSISTANT:  Abigail Miyamoto, M.D.   ANESTHESIA:  General.   HISTORY OF PRESENT ILLNESS:  The patient is a 77 year old white male who  underwent right inguinal hernia repair with mesh in December 2003.  Since  that time, he has had several complications from right knee replacement that  actually required removal of his knee prosthesis and subsequent replacement  of a new one.  Afterward, he was doing extensive rehabilitation during which  time he felt increasing straining and pain in his right groin area and I  evaluated him in the office and he had a recurrence of his hernia and he  presents now for repair.   PROCEDURE IN DETAIL:  Informed consent was obtained.  The patient received  intravenous antibiotics.  He was taken to the operating room and general  anesthesia was administered.  His lower abdomen and right groin were prepped  and draped in a sterile fashion.  An incision was made in the right groin  along the old scar.  Subcutaneous tissues were dissected down revealing a  good deal of scar tissue.  The external oblique was divided and meticulous  dissection ensued of the underlying structures.  We identified the cord.  This was gradually dissected free from the scar tissue down to the pubic  tubercle and it was encircled more laterally.  A recurrence with a  large  direct hernia as he had minimal tissues in his inguinal floor at this time  and there was a large direct hernia containing omentum.  This was dissected  free from the cord structures and surrounding tissue.  The distal portion of  the omentum was resected and this was reduced back into the abdomen.  Further dissection superiorly cleared the aponeurosis.  Small blood vessels  were divided and ligated for hemostasis.  Once the dissection was completed,  we felt the best option for the repair would be the [employer] polypropylene  hernia system with two layers of mesh connected by a central circular area.  The internal portion of the mesh was folded and this went inside and  unfurled to work as a plug/mesh layer keeping the direct hernia nicely  reduced.  A slit was made in the outer portio of the mesh  and this was  fashioned around the cord as were the keyhole mesh and the outer leaflet of  the mesh was tacked medially to the tissues over the pubic tubercle with 2-0  Prolene in an interrupted fashion, it was tacked along what was left of the  shelving edge of the inguinal ligament and its old mesh inferiorly, and  tacked superiorly around the transversalis.  The two ends of the leaflet  were rejoined to recreate his ring and this was tacked together with a few  more 2-0 Prolene.  Laterally, the mesh was again tacked down to the  underlying musculature.  Onc this was completed, the area was copiously  irrigated.  The repair looked nicely intact.  The area was checked for good  hemostasis and that was insured.  The external oblique was reapproximated  with a running 2-0 Vicryl suture.  0.25% Marcaine with epinephrine was  injected all around, both in the skin and the musculature for postoperative  pain relief.  The subcutaneous tissues were reapproximated with a series of  interrupted 3-0 Vicryl sutures.  The area was again  irrigated and the skin was closed with a running 4-0 Monocryl  subcuticular  stitch.  Sponge, needle, and instrument counts were correct.  Benzoin, Steri-  Strips, and sterile dressings were applied.  The patient tolerated the  procedure well without apparent complications.  He was taken to the recovery  room in stable condition.                                               Gabrielle Dare Janee Morn, M.D.    BET/MEDQ  D:  10/17/2003  T:  10/17/2003  Job:  [id number]

## 2011-01-14 NOTE — Op Note (Signed)
[facility]  Patient:    ILAI, HILLER                           MRN: [id number] Proc. Date: 03/16/01 Adm. Date:  [id number] Disc. Date: [id number] Attending:  Charlton Haws CC:         Reuben Likes, M.D.   Operative Report  ACCOUNT NO. [id number]. CCS [phone].  PREOPERATIVE DIAGNOSES: 1. Melanoma (1.1 mm, Clarkss level IV), left arm. 2. Basal cell, left posterior neck.  POSTOPERATIVE DIAGNOSES: 1. Melanoma (1.1 mm, Clarkss level IV), left arm. 2. Basal cell, left posterior neck.  PROCEDURES: 1. Wide excision of left arm melanoma, blue dye injection, and left    axillary sentinel node dissection. 2. Excision of basal cell carcinoma of the left neck.  SURGEON:  Currie Paris, M.D.  ANESTHESIA:  General.  CLINICAL HISTORY:  This patient recently had an excisional biopsy of a small lesion of the left posterior arm over the deltoid, which proved to be melanoma.  He had a negative axillary exam clinically.  He also had had a shave biopsy of a lesion of the left posterior neck, which proved to be a _____.  DESCRIPTION OF PROCEDURE:  The patient was met in the holding area and identified.  The melanoma site had already been injected and was marked with some ink.  The basal cell carcinoma site was also marked with ink.  The patient was then taken to the operating room, where satisfactory general endotracheal anesthesia was obtained.  I injected some blue dye around the melanoma site proximal to it.  Using the NeoProbe, I identified a hot area in the axilla and marked it.  The axillary area was then prepped and draped.  An incision was made just over the area that was hot and subcutaneous tissues divided down to where I entered the axillary fat tissue.  Using NeoProbe, I identified a hot area with a little palpation and felt about a 2 cm node, which was grasped.  While it was not yet blue, there were two blue lymphatics leading  directly into it.  This was excised.  They had counts of around 27-28 with most of the background being around 0.  I found no other blue dye on further dissection but did identify two other hot nodes.  These had counts of about 8-10 with again backgrounds being 0.  Another node that I identified as I was searching did have a small black surface spot on it, and I excised it. This was neither blue nor hot, and I was not sure whether this was just a small blood vessel that had clotted while I was working or whether this represented something more ominous.  No other specific abnormalities were noted in the axilla.  The wound was closed with 3-0 Vicryl in layers, followed by 4-0 Monocryl subcuticular, and at the very end, Steri-Strips.  Using some new instruments and gloves, the left arm was repositioned and draped.  The prior incision was a transverse one, but I elected to do the wide excision longitudinally.  I then marked out an incision, taking a margin anteriorly and posterior around the prior scar and going a total length of approximately 8-9 cm.  Incision was made and deepened through the subcutaneous tissues down to fascia but left the fascia behind.  Full-thickness excision was taken.  Bleeders electrocoagulated, and Marcaine was used for postop analgesia.  The wound  was closed in layers with 3-0 Vicryl, followed by 4-0 Monocryl subcuticular, and again Steri-Strips.  Again, new instruments were used, and the area around the basal cell in the left posterior neck was prepped and draped.  Elliptical incision was made, taking what looked like about a 2 mm margin.  This was closed in layers with 3-0 Vicryl and 4-0 Monocryl.  Sterile dressings were applied.  The patient tolerated the procedure well.  There were no operative complications, and all counts were correct. DD:  03/16/01 TD:  03/17/01 Job: [id number]

## 2011-01-14 NOTE — Op Note (Signed)
NAME:  Nicholas Burton, Nicholas Burton                            ACCOUNT NO.:  [id number]   MEDICAL RECORD NO.:  [id number]                   PATIENT TYPE:  INP   LOCATION:  0480                                 FACILITY:  [facility]   PHYSICIAN:  Ollen Gross, M.D.                 DATE OF BIRTH:  1928-04-02   DATE OF PROCEDURE:  06/25/2003  DATE OF DISCHARGE:                                 OPERATIVE REPORT   PREOPERATIVE DIAGNOSIS:  Resection arthroplasty, right knee.   POSTOPERATIVE DIAGNOSIS:  Resection arthroplasty, right knee.   PROCEDURE:  Reimplantation right total knee arthroplasty.   SURGEON:  Gus Rankin. Aluisio, M.D.   ASSISTANT:  Madlyn Frankel. Charlann Boxer, M.D.   ANESTHESIA:  General.   ESTIMATED BLOOD LOSS:  100 mL.   DRAIN:  Hemovac x 1.   TOURNIQUET TIME:  Up 53 minutes at 300 mmHg, down 8 minutes, and up an  additional 37 minutes at 300 mmHg.   COMPLICATIONS:  None.   CONDITION:  Stable to recovery.   BRIEF CLINICAL NOTE:  Mr. Erhart is a 77 year old male, who underwent  resection arthroplasty of his right knee approximately six weeks ago  secondary to infection.  He has had a stable postoperative course, and  infection markers have returned back to normal.  He presents now for  reimplantation of his right total knee.   PROCEDURE IN DETAIL:  After the successful administration of general  anesthetic, a tourniquet is placed high on the right thigh and right lower  extremity prepped and draped in the usual sterile fashion.  Extremity is  wrapped in Esmarch and tourniquet inflated to 300 mmHg.  Midline incision is  made through the skin with a 10 blade through the subcutaneous tissue which  is elevated off of the extensor mechanism.  A fresh blade is used to make a  medial parapatellar arthrotomy.  We did not encounter any abnormal fluid in  the joint.  The tissue overall had a very healthy-looking appearance.  Any  scar tissue now is excised to recreate the medial and lateral gutters,  and  then I elevated the soft tissue over the proximal and medial tibia to the  joint line with a knife and into the semimembranosus bursa with a curved  osteotome.  I had to do a quadriceps snip in order to evert the patella.  We  debrided along the scar tissue.  Once that was completed, I used the canal  finder to find the tibial and femoral canals and then irrigated thoroughly.  I reamed the femoral canal to 18 mm, tibial is now a 15.  We placed the  intramedullary tibial alignment guide and freshened the cut of the proximal  tibia to where it was a flat surface and normal-looking cancellous bone.  I  took about 2 mm off altogether.  The size 4 is going to be the most  appropriate tibial component.  We placed the 4 with the 15 x 60 trial stem  and had excellent coverage of the tibia.   Then prepared the femur, placed an 18 mm dummy stem and then the 5-degree  right valgus alignment guide.  With the block pinned in the zero position,  we had to go up to the 4 mm cut on the lateral side to get a flat surface  then up to 8 mm on the medial side just to encounter bone.  We thus had to  use 8 mm distal augment medially, 4 mm distal augment laterally.  I then  placed the AP block and used the 10 mm spacer in order to get a balanced  flexion gap.  The rotation did correspond with the epicondylar axis.  We  placed the block in the plus 2 position so as to effectively raise the stem  and lower the block so that we would not be browsing anteriorly on the  femur.  Despite this, I still needed a 4 mm augment posterolaterally.  Once  we made the anterior and posterior cuts, then the revision block is placed  and intercondylar and chamfer cuts made.  Trial prosthesis is then assembled  with an 18 x 125 stem, size 4 femur, posterior stabilized with an 8 mm  distal medial augment, 4 mm distal lateral augment, and 4 mm posterolateral  augment.  The tibial side had the size 4 tibial tray with the 15 x 60  stem  extension.  We were able to get a 12.5 mm insert in, and full extension is  achieved with excellent varus and valgus balance throughout full range of  motion.  The rotation into the components marked, and then we prepared the  proximal tibia with a keel punch.  Then worked on the patella by debriding  all the scar tissue, and the thickness of the patella was found to be 15 mm.  I resected to a flat surface at 13 mm.  We placed a 38 template, drilled the  lug holes, and placed a trial patella which fit nicely.  We then placed  towel clips at the quad snip and also at the medial arthrotomy, and the  patella tracked normally.  The tourniquet is then released for a total time  of 53 minutes.  Then inspected the back of the joints, and there is no  significant bleeding.  I kept the tourniquet down for a total of 8 minutes  and then re-wrapped the knee and reinflated the tourniquet.  Once completed,  then we trialed the canal restrictor on the tibial side, and it was a size  4.  I placed it at the appropriate depth in the tibial canal.  We then  prepared all the bone with pulsatile lavage.  In the interim, the permanent  prostheses were assembled on the back table, utilizing the sizes using the  trial.  We then mixed the cement and once ready for implantation, injected  it into the tibial canal and pressurized, and placed the tibial components.  On the femoral side, we just cemented at the distal surface, and the stem  was pressed fit.  The patella is also cemented and held with a clamp.  A  12.5 insert trial is placed, knee held in full extension, all extruded  cement removed.  Once the cement is fully hardened, then the permanent 12.5  mm posterior stabilized insert is placed into the tibial tray.  The wound  is  then copiously irrigated with antibiotic solution and extensor mechanism and quad snip closed over a Hemovac drain with interrupted #1 PDS.  Flexion  against gravity is about 75  degrees.  I could push him down to 90 without  disrupting any of the sutures.  We then released the tourniquet and closed  the subcu with interrupted 2-0 Vicryl and skin with staples.  The incision  was cleaned and dried and a bulky sterile dressing applied.  Prior to that,  I had placed the catheter for the Marcaine pain pump.  Once the dressing is  applied, he is placed into a knee immobilizer, awakened, and transported to  recovery in stable condition with intact neurovascular function.                                               Ollen Gross, M.D.    FA/MEDQ  D:  06/25/2003  T:  06/25/2003  Job:  [id number]

## 2011-01-14 NOTE — Op Note (Signed)
NAME:  Nicholas Burton, Nicholas Burton                            ACCOUNT NO.:  [id number]   MEDICAL RECORD NO.:  [id number]                   PATIENT TYPE:  AMB   LOCATION:  DSC                                  FACILITY:  [facility]   PHYSICIAN:  Loreta Ave, M.D.              DATE OF BIRTH:  08/14/1928   DATE OF PROCEDURE:  01/23/2003  DATE OF DISCHARGE:                                 OPERATIVE REPORT   PREOPERATIVE DIAGNOSES:  1. Status post total knee replacement with arthrofibrosis.  2. Now serous drainage from a prepatellar bursitis.  3. No obvious infection.   POSTOPERATIVE DIAGNOSES:  1. Status post total knee replacement with arthrofibrosis.  2. Now serous drainage from a prepatellar bursitis.  3. No obvious infection.   PROCEDURES:  1. Right knee examination and manipulation under anesthesia.  2. Open excision of prepatellar bursa with thorough lavage and primary     closure.   SURGEON:  Loreta Ave, M.D.   ASSISTANT:  Arlys John D. Petrarca, P.A.-C.   ANESTHESIA:  General.   ESTIMATED BLOOD LOSS:  Minimal.   SPECIMENS:  Excised bursa.   CULTURES:  Aerobic, anaerobic cultures were obtained, but there was no gross  purulence.   DRAINS:  None.   COMPLICATIONS:  None.   TOURNIQUET TIME:  30 minutes.   DESCRIPTION OF PROCEDURE:  Patient brought to the operating room and after  adequate anesthesia had been obtained, right knee examined.  He still has a  fair amount of arthrofibrosis with thickening and decreased patellofemoral  mobility.  He was manipulated, achieving full extension and more than 100  degrees of flexion without  much in the way of adhesions and what was there  was easily broken up, improving patellofemoral mobility.  Tourniquet  applied, prepped and draped in the usual sterile fashion.  Tourniquet  inflated after elevation to 350 mmHg.  Area of drainage at the bottom of his  total knee incision was elliptically excised, leading down to a prepatellar  bursa  that extended over to the pes bursal region.  All of this completely  excised.  A lot of inflammatory debris but there was no gross purulence, and  the only drainage that was there was clear.  It  did not obviously  communicate to the knee itself.  Once I had excised all of the tissue around  the bursa, I did enter the capsule in front of the medial collateral  ligament, getting some blood coming from within the knee itself, but again  no purulence.  Thorough irrigation with a pulse irrigating device,  hemostasis with cautery, and then primary closure with nylon.  Bulky  compressive dressing applied, tourniquet deflated, anesthesia reversed,  brought to the recovery room.  Tolerated the surgery well with no  complications.  Loreta Ave, M.D.    DFM/MEDQ  D:  01/23/2003  T:  01/24/2003  Job:  [id number]

## 2011-01-14 NOTE — Op Note (Signed)
NAME:  Nicholas Burton, Nicholas Burton                            ACCOUNT NO.:  [id number]   MEDICAL RECORD NO.:  [id number]                   PATIENT TYPE:  INP   LOCATION:  5006                                 FACILITY:  [facility]   PHYSICIAN:  Loreta Ave, M.D.              DATE OF BIRTH:  December 18, 1927   DATE OF PROCEDURE:  06/13/2002  DATE OF DISCHARGE:                                 OPERATIVE REPORT   PREOPERATIVE DIAGNOSIS:  End-stage degenerative arthritis with varus  alignment and flexion contracture, right knee.   POSTOPERATIVE DIAGNOSIS:  End-stage degenerative arthritis with varus  alignment and flexion contracture, right knee.   PROCEDURE:  Right total knee replacement utilizing Osteonics prosthesis.  Press-fit posterior-stabilizing #9 femoral component.  Cemented #11 tibial  component with a 12 mm polyethylene Flex insert.  Cemented, recessed,  nonmetal-backed 26 mm patellar component.  Appropriate soft tissue  balancing.   SURGEON:  Loreta Ave, M.D.   ASSISTANT:  Arlys John D. Petrarca, P.A.-C.   ANESTHESIA:  General.   ESTIMATED BLOOD LOSS:  Minimal.   TOURNIQUET TIME:  1 hour 20 minutes.   SPECIMENS:  Excised bone and soft tissue.   CULTURES:  None.   COMPLICATIONS:  None.   DRESSING:  Soft compressive with knee immobilizer.   DRAINS:  Hemovac x2.   DESCRIPTION OF PROCEDURE:  Patient brought to the operating room and after  adequate anesthesia had been obtained, the right knee examined.  Alignment  in varus, correctable to neutral.  Stable ligaments.  A 5 degree flexion  contracture, further flexion to 130.  Tourniquet applied, prepped and draped  in the usual sterile fashion.  Exsanguinated with elevation and an Esmarch.  Tourniquet inflated to 350 mmHg.  Straight incision above the patella down  to the tibial tubercle.  Hemostasis with cautery.  Medial peripatellar  arthrotomy.  Grade 4 changes throughout.  Medial capsule release off the  tibia to achieve a  balanced knee.  Remnants of menisci, cruciate ligaments,  periarticular spurs, loose bodies, synovitis all removed.  Distal femur  exposed.  The intramedullary guide placed.  Distal cut removing 10 mm set at  5 degrees of valgus.  Sized for a #9 component.  Jigs put in place,  definitive cuts made.  Trial put in place, found to fit well with the  posterior-stabilizing component.  Trial removed.  Tibial spine removed with  a saw.  Intramedullary guide placed.  Proximal tibial cut removing 4-6 mm  off the deficient medial side perpendicular to the shaft with a 5 degree  posterior slope cut.  Sized for a #11 component.  Patella sized, reamed, and  drilled for the recessed 26 mm component.  All trials put in place, a #9 on  the femur, #11 on the tibia.  With the 12 mm Flex insert, a nicely-balanced  knee with full extension, full flexion, set at 5 degrees of  valgus after  medial capsular release.  The tibia was marked for rotation and then hand-  reamed for the component.  All trials removed.  Copious irrigation with a  pulse irrigation device.  All recesses examined.  All spurs, especially in  the back, removed.  Cement prepared, placed on the tibial component, which  was hammered in place.  Polyethylene attached.  Femoral component seated.  The knee reduced.  The patellar component cemented in place in the bed that  had been created in the patella.  All excessive cement removed.  Once the  cement had hardened, the knee was re-examined.  Good motion, good full  extension, 5 degrees valgus alignment, good stability, full flexion, and  good patellofemoral tracking.  The wound irrigated.  A Hemovac was placed  through separate stab wounds.  Arthrotomy closed with #1 Vicryl, skin and  subcutaneous tissue with Vicryl and staples.  Margins of the wound and the  knee injected with Marcaine and the Hemovac clamped.  A sterile compressive  dressing applied.  Tourniquet deflated and removed.  Knee  immobilizer  applied.  Anesthesia reversed.  Brought to the recovery room.  Tolerated the  surgery well with no complications.                                                  Loreta Ave, M.D.    DFM/MEDQ  D:  06/13/2002  T:  06/14/2002  Job:  [id number]

## 2011-01-14 NOTE — Discharge Summary (Signed)
NAME:  Nicholas Burton, Nicholas Burton                            ACCOUNT NO.:  [id number]   MEDICAL RECORD NO.:  [id number]                   PATIENT TYPE:  INP   LOCATION:  0480                                 FACILITY:  [facility]   PHYSICIAN:  Ollen Gross, M.D.                 DATE OF BIRTH:  Oct 13, 1927   DATE OF ADMISSION:  06/25/2003  DATE OF DISCHARGE:  06/30/2003                                 DISCHARGE SUMMARY   ADMITTING DIAGNOSES:  1. 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  2. Diabetes mellitus  3. History of skin melanoma.   DISCHARGE DIAGNOSES:  1. Resection arthroplasty right knee secondary to infection status post     reimplantation arthroplasty right total knee.  2. Diabetes mellitus.  3. History of skin melanoma.  4. Postoperative blood loss anemia.  5. Status post transfusion without sequelae.   PROCEDURE:  The patient was taken to the operating room on June 25, 2003.  Underwent a reimplantation right total knee arthroplasty.  Surgeon Ollen Gross, M.D.  Assistant Madlyn Frankel. Charlann Boxer, M.D.  Anesthesia general.  Blood  loss 100 mL.  Hemovac drain x1.  Tourniquet time 53 minutes at 300 mmHg,  then was down for eight minutes, then up additional 37 minutes at 300 mmHg.   CONSULTS:  Infectious disease, Lacretia Leigh. Ninetta Lights, M.D.   BRIEF HISTORY:  The patient is a 77 year old male who previously underwent a  resection arthroplasty of a right knee approximately six weeks ago secondary  to infection.  He is having stable postoperative course.  Infection markers  have returned back to normal.  He has completed his IV antibiotics and now  presents for reimplantation of the total knee.   LABORATORY DATA:  CBC on admission:  Hemoglobin 12.6, hematocrit 37.4,  normal white count 7.3, red cell count 4.58.  Differential all within normal  limits.  Postoperative H&H 10.1, 29.8.  Hemoglobin continued to decline down  to a level of 8.2 and  24.9.  Given blood.  Post transfusion hemoglobin back  up to 10.0 and 28.9.  Sedimentation rate taken during the hospital course  was normal at 5.  This was done on June 26, 2003.  Repeat postoperative  sedimentation rate on June 27, 2003 elevated at 25.  PT/PTT on admission  were 12.5 and 33, respectively with INR 0.9.  Serial pro times followed.  Last noted PT/INR 22.7 and 2.7.  C reactive protein:  High sensitivity level  at 175.3.  This was taken on June 27, 2003.  Blood group type O+.  Two  views right knee on June 25, 2003:  Status post total knee arthroplasty  without adverse features.   HOSPITAL COURSE:  The patient was admitted to [facility], taken to  OR.  Underwent above stated procedure without complication.  The patient  tolerated  procedure well and later returned to recovery room and then to  orthopedic floor for continued postoperative care.  Vital signs were  followed.  He was placed on vancomycin per protocol for 48 hours.  He was  given Coumadin for DVT prophylaxis.  PT and OT were consulted to assist with  gait training, ambulation, ADLs.  Postoperative x-rays were taken due to  reimplantation procedure.  Postoperative x-rays looked good, no adverse  features.  He was started back on his home medications.  Also placed on  sliding scale due to his diabetes.  Hemovac drain placed at time of surgery.  He did have some elevated serum blood sugars up to 272 on the early morning  of postoperative day one.  We added the bedtime coverage for his diabetes.  Infectious disease was reconsulted.  Lacretia Leigh. Ninetta Lights, M.D. followed  during the hospital course.  They rechecked his sedimentation rate and C  reactive protein, recommended he continue on his vancomycin.  By day two  patient was doing much better.  He had initially been placed on PC  analgesics with p.o. medications.  He was weaned over just to p.o.  medications and PCA and IV were discontinued after  completed 48 hours of  antibiotics.  He actually did fairly well again with physical therapy.  He  was up ambulating 50 feet by day one and then 200 feet by day two.  Continued to progress very well with his mobility.  Dressing change  initiated on postoperative day two.  Incision was healing well.  He was  allowed to have his vancomycin PICC line removed.  He did have some  intermittent temperatures postoperatively.  This was treated with incentive  spirometer and antipyretics.  The patient actually did very well again  throughout the hospital course.  His laboratories were followed.  Unfortunately, the hemoglobin postoperatively continued to decline.  It was  noted to have dropped down to a level of 8.2.  He was given 2 units of blood  on day four.  By day five he was feeling much better.  Been up ambulating  well with therapy.  His hemoglobin was back up to 10.0, feeling better.  It  was decided patient would be discharged home at that time.   DISCHARGE PLAN:  The patient discharged home on June 30, 2003.   DISCHARGE DIAGNOSES:  Please see above.   DISCHARGE MEDICATIONS:  1. Coumadin.  2. Robaxin.  3. Vicodin.   DIET:  Diabetic diet.   FOLLOWUP:  Tuesday.   ACTIVITY:  Total knee protocol.  Home health PT and home health nursing  through [facility].   DISPOSITION:  Home.   CONDITION ON DISCHARGE:  Improved.     Nicholas Burton, P.A.              Ollen Gross, M.D.    ALP/MEDQ  D:  07/09/2003  T:  07/09/2003  Job:  [id number]   cc:   Reuben Likes, M.D.  [address]  [place]  [place] [address]  Fax: [phone]   Rockey Situ. Flavia Shipper., M.D.  [address]  [place]  [place] [address]  Fax: [phone]   Lacretia Leigh. Ninetta Lights, M.D.  [address]  [place]  [place] [address]  Fax: [phone]

## 2011-01-14 NOTE — Consult Note (Signed)
NAME:  Nicholas Burton, Nicholas Burton                            ACCOUNT NO.:  [id number]   MEDICAL RECORD NO.:  [id number]                   PATIENT TYPE:  INP   LOCATION:  5006                                 FACILITY:  [facility]   PHYSICIAN:  Reuben Likes, M.D.               DATE OF BIRTH:  May 14, 1928   DATE OF CONSULTATION:  06/14/2002  DATE OF DISCHARGE:                                   CONSULTATION   HISTORY OF PRESENT ILLNESS:  The patient is a 77 year old male who is well  known to me and has a history of type 2 diabetes since 1993.  He had a knee  replacement on June 13, 2002, and his postoperative course has been  uneventful except for CBGs being slightly high.  The range yesterday,  June 13, 2002, was 168-245, and today the CBG readings were 186, 289, and  285.  At home prior to surgery his CBGs were 75-130 fasting, with no  hypoglycemic episodes.  His last laboratory work was done a little over a  month ago on May 08, 2002, which showed a glucose of 91, an A1C of  7.1, and an LDL of 105.   PAST SURGICAL HISTORY:  Malignant melanoma removed from his left upper arm  in August 2002.   PAST MEDICAL HISTORY:  Hospitalization, none.  Illnesses, none.   MEDICATIONS:  1. Glucotrol XL 10 mg 2 per day.  2. Actos 30 mg once a day.  3. Glucophage 500 mg b.i.d.  4. Aspirin 81 mg a day.   ALLERGIES:  He is allergic to PENICILLIN.   FAMILY HISTORY:  His father died at age 54 of a heart attack.  His mother  died at age 51 of a heart attack.  She also had diabetes.  He has a brother  who died of some type of cancer.  He has four sisters, two of whom have  cancer, and one son in good health.   SOCIAL HISTORY:  He was a Engineer, maintenance (IT).  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  He is retired from [employer].  He has never used  tobacco.  He drinks beer rarely.  He is married.   REVIEW OF SYSTEMS:  No other systemic, skin, eye, ENT, respiratory,  cardiovascular, GI,  GU, musculoskeletal, or neurological complaints.   PHYSICAL EXAMINATION:  VITAL SIGNS:  Blood pressure 130/68, pulse 86 and  regular, respirations 22, temperature 99.4.  GENERAL:  Alert and in no distress.  SKIN:  Warm and dry.  No rash.  HEENT:  Eyes:  Pupils are equal, round, and reactive to light.  Full EOMs.  Fundi benign.  Sclerae nonicteric.  ENT:  TMs normal.  No intraoral lesions.  Pharynx clear.  Moist mucous membranes.  NECK:  Supple.  No adenopathy, JVD, or bruit.  Thyroid normal.  LUNGS:  Clear to auscultation.  HEART:  Regular rhythm.  No gallop or murmur.  ABDOMEN:  Soft and nontender.  No organomegaly or mass.  Bowel sounds  normally active.  EXTREMITIES:  No pedal edema.  Pulses were full.  NEUROLOGIC:  Alert and oriented x 3.  Speech was clear and appropriate.  No  extremity weakness or tremor.  DTRs unobtainable.  Babinski's downgoing.  Cranial nerves intact.   LABORATORY DATA:  CBC shows hemoglobin 14.4, white count 9300.  INR 0.9.  CMET shows sodium 144, potassium 5.3, glucose 116, BUN 16, creatinine 0.9.  Urinalysis was within normal limits.   EKG:  Within normal limits.   IMPRESSION:  1. Diabetes type 2 with slight increase in glucose postoperatively.  2. Status post knee replacement.  3. History of malignant melanoma.   PLAN:  1. Lantus 20 units once a day.  2. Sliding scale insulin.  3. Continue same p.o. diabetes medications.  4. I will follow him during his hospital stay and hope to be able to send     him home on just his usual p.o. medications.                                               Reuben Likes, M.D.    DCK/MEDQ  D:  06/15/2002  T:  06/15/2002  Job:  [id number]   cc:   Loreta Ave, M.D.

## 2011-01-18 ENCOUNTER — Ambulatory Visit (INDEPENDENT_AMBULATORY_CARE_PROVIDER_SITE_OTHER): Payer: Medicare Other | Admitting: Internal Medicine

## 2011-01-18 ENCOUNTER — Other Ambulatory Visit (INDEPENDENT_AMBULATORY_CARE_PROVIDER_SITE_OTHER): Payer: Medicare Other

## 2011-01-18 ENCOUNTER — Encounter: Payer: Self-pay | Admitting: Internal Medicine

## 2011-01-18 DIAGNOSIS — I10 Essential (primary) hypertension: Secondary | ICD-10-CM

## 2011-01-18 DIAGNOSIS — E785 Hyperlipidemia, unspecified: Secondary | ICD-10-CM

## 2011-01-18 DIAGNOSIS — R609 Edema, unspecified: Secondary | ICD-10-CM

## 2011-01-18 DIAGNOSIS — E119 Type 2 diabetes mellitus without complications: Secondary | ICD-10-CM

## 2011-01-18 LAB — URINALYSIS, ROUTINE W REFLEX MICROSCOPIC
Leukocytes, UA: NEGATIVE
Nitrite: NEGATIVE
Specific Gravity, Urine: >=1.03 (ref 1.000–1.030)
Urobilinogen, UA: 0.2 (ref 0.0–1.0)
pH: 5 (ref 5.0–8.0)

## 2011-01-18 LAB — COMPREHENSIVE METABOLIC PANEL
Alkaline Phosphatase: 89 U/L (ref 39–117)
BUN: 20 mg/dL (ref 6–23)
CO2: 28 mEq/L (ref 19–32)
Creatinine, Ser: 0.9 mg/dL (ref 0.4–1.5)
GFR: 81.85 mL/min (ref >60.00)
Glucose, Bld: 202 mg/dL — ABNORMAL HIGH (ref 70–99)
Sodium: 140 mEq/L (ref 135–145)
Total Bilirubin: 0.7 mg/dL (ref 0.3–1.2)
Total Protein: 6 g/dL (ref 6.0–8.3)

## 2011-01-18 LAB — CBC WITH DIFFERENTIAL/PLATELET
Basophils Relative: 0.4 % (ref 0.0–3.0)
Eosinophils Absolute: 0.2 10*3/uL (ref 0.0–0.7)
HCT: 41.4 % (ref 39.0–52.0)
Lymphs Abs: 2.6 10*3/uL (ref 0.7–4.0)
MCHC: 34 g/dL (ref 30.0–36.0)
MCV: 84.9 fl (ref 78.0–100.0)
Monocytes Absolute: 0.7 10*3/uL (ref 0.1–1.0)
Neutrophils Relative %: 55.6 % (ref 43.0–77.0)
Platelets: 218 10*3/uL (ref 150.0–400.0)
RBC: 4.88 Mil/uL (ref 4.22–5.81)

## 2011-01-18 LAB — TSH: TSH: 1.49 u[IU]/mL (ref 0.35–5.50)

## 2011-01-18 MED ORDER — TELMISARTAN-HCTZ 80-12.5 MG PO TABS
1.0000 | ORAL_TABLET | Freq: Every day | ORAL | Status: DC
Start: 2011-01-18 — End: 2011-09-08

## 2011-01-18 NOTE — Progress Notes (Signed)
Subjective:    Patient ID: Nicholas Burton, male    DOB: February 25, 1928, 77 y.o.   MRN: [id number]  Hypertension This is a chronic problem. The current episode started more than 1 year ago. The problem has been gradually worsening since onset. The problem is uncontrolled. Associated symptoms include peripheral edema. Pertinent negatives include no anxiety, blurred vision, chest pain, headaches, malaise/fatigue, neck pain, orthopnea, palpitations, PND, shortness of breath or sweats. There are no associated agents to hypertension. Past treatments include nothing. Compliance problems include diet and exercise.   Diabetes He presents for his follow-up diabetic visit. He has type 2 diabetes mellitus. No MedicAlert identification noted. His disease course has been improving. There are no hypoglycemic associated symptoms. Pertinent negatives for hypoglycemia include no confusion, dizziness, headaches, nervousness/anxiousness, pallor, seizures, speech difficulty, sweats or tremors. Pertinent negatives for diabetes include no blurred vision, no chest pain, no fatigue, no foot paresthesias, no foot ulcerations, no polydipsia, no polyphagia, no polyuria, no visual change, no weakness and no weight loss. Symptoms are stable. There are no diabetic complications. Current diabetic treatment includes oral agent (monotherapy) and insulin injections. He is compliant with treatment all of the time. His weight is stable. He is following a generally healthy diet. Meal planning includes avoidance of concentrated sweets. He has not had a previous visit with a dietician. He never participates in exercise. His home blood glucose trend is decreasing steadily. His breakfast blood glucose range is generally 70-90 mg/dl. His lunch blood glucose range is generally 90-110 mg/dl. His dinner blood glucose range is generally 90-110 mg/dl. His highest blood glucose is 90-110 mg/dl. His overall blood glucose range is 90-110 mg/dl. An ACE  inhibitor/angiotensin II receptor blocker is not being taken. He does not see a podiatrist.Eye exam is current.      Review of Systems  Constitutional: Negative for fever, chills, weight loss, malaise/fatigue, diaphoresis, activity change, appetite change, fatigue and unexpected weight change.  HENT: Negative for facial swelling, trouble swallowing, neck pain and voice change.   Eyes: Negative for blurred vision, photophobia and visual disturbance.  Respiratory: Negative for apnea, cough, choking, chest tightness, shortness of breath, wheezing and stridor.   Cardiovascular: Positive for leg swelling. Negative for chest pain, palpitations, orthopnea and PND.  Gastrointestinal: Negative for nausea, vomiting, abdominal pain, diarrhea, constipation and abdominal distention.  Genitourinary: Negative for dysuria, urgency, polyuria, frequency, hematuria, decreased urine volume, enuresis and difficulty urinating.  Musculoskeletal: Negative for myalgias, back pain, joint swelling, arthralgias and gait problem.  Skin: Negative for color change, pallor and rash.  Neurological: Negative for dizziness, tremors, seizures, syncope, facial asymmetry, speech difficulty, weakness, light-headedness, numbness and headaches.  Hematological: Negative for polydipsia, polyphagia and adenopathy. Does not bruise/bleed easily.  Psychiatric/Behavioral: Negative for suicidal ideas, hallucinations, behavioral problems, confusion, sleep disturbance, self-injury, dysphoric mood, decreased concentration and agitation. The patient is not nervous/anxious and is not hyperactive.        Objective:   Physical Exam  Vitals reviewed. Constitutional: He is oriented to person, place, and time. He appears well-developed and well-nourished. No distress.  HENT:  Head: Normocephalic and atraumatic.  Right Ear: External ear normal.  Left Ear: External ear normal.  Nose: Nose normal.  Mouth/Throat: Oropharynx is clear and moist. No  oropharyngeal exudate.  Eyes: Conjunctivae and EOM are normal. Pupils are equal, round, and reactive to light. Right eye exhibits no discharge. Left eye exhibits no discharge. No scleral icterus.  Neck: Normal range of motion. Neck supple. No JVD present. No tracheal  deviation present. No thyromegaly present.  Cardiovascular: Normal rate, regular rhythm, normal heart sounds and intact distal pulses.  Exam reveals no gallop and no friction rub.   No murmur heard. Pulmonary/Chest: Effort normal and breath sounds normal. No stridor. No respiratory distress. He has no wheezes. He has no rales. He exhibits no tenderness.  Abdominal: Soft. Bowel sounds are normal. He exhibits no distension and no mass. There is no tenderness. There is no rebound and no guarding.  Musculoskeletal: He exhibits edema (2+ pitting edema in both legs). He exhibits no tenderness.  Lymphadenopathy:    He has no cervical adenopathy.  Neurological: He is alert and oriented to person, place, and time. He has normal reflexes. He displays normal reflexes. No cranial nerve deficit. He exhibits normal muscle tone. Coordination normal.  Skin: Skin is warm and dry. No rash noted. He is not diaphoretic. No erythema. No pallor.  Psychiatric: He has a normal mood and affect. His behavior is normal. Judgment and thought content normal.        Lab Results  Component Value Date   WBC 8.8 11/10/2010   HGB 14.1 11/10/2010   HCT 41.7 11/10/2010   PLT 231.0 11/10/2010   CHOL 120 11/10/2010   TRIG 63.0 11/10/2010   HDL 42.50 11/10/2010   ALT 20 11/10/2010   AST 23 11/10/2010   NA 140 11/10/2010   K 4.6 11/10/2010   CL 105 11/10/2010   CREATININE 0.8 11/10/2010   BUN 15 11/10/2010   CO2 29 11/10/2010   TSH 2.03 11/10/2010   INR 1.3 ratio* 08/11/2010   HGBA1C 8.2* 11/10/2010   MICROALBUR 1.2 01/07/2010    Assessment & Plan:

## 2011-01-18 NOTE — Patient Instructions (Signed)
Diabetes, Type 2 Diabetes is a lasting (chronic) disease. In type 2 diabetes, the pancreas does not make enough insulin (a hormone), and the body does not respond normally to the insulin that is made. This type of diabetes was also previously called adult onset diabetes. About 90% of all those who have diabetes have type 2. It usually occurs after the age of 77 but can occur at any age. CAUSES Unlike type 1 diabetes, which happens because insulin is no longer being made, type 2 diabetes happens because the body is making less insulin and has trouble using the insulin properly. SYMPTOMS  Drinking more than usual.   Urinating more than usual.   Blurred vision.   Dry, itchy skin.   Frequent infection like yeast infections in women.   More tired than usual (fatigue).  TREATMENT  Healthy eating.   Exercise.   Medication, if needed.   Monitoring blood glucose (sugar).   Seeing your caregiver regularly.  HOME CARE INSTRUCTIONS  Check your blood glucose (sugar) at least once daily. More frequent monitoring may be necessary, depending on your medications and on how well your diabetes is controlled. Your caregiver will advise you.   Take your medicine as directed by your caregiver.   Do not smoke.   Make wise food choices. Ask your caregiver for information. Weight loss can improve your diabetes.   Learn about low blood glucose (hypoglycemia) and how to treat it.   Get your eyes checked regularly.   Have a yearly physical exam. Have your blood pressure checked. Get your blood and urine tested.   Wear a pendant or bracelet saying that you have diabetes.   Check your feet every night for sores. Let your caregiver know if you have sores that are not healing.  SEEK MEDICAL CARE IF:  You are having problems keeping your blood glucose at target range.   You feel you might be having problems with your medicines.   You have symptoms of an illness that is not improving after 24  hours.   You have a sore or wound that is not healing.   You notice a change in vision or a new problem with your vision.   You develop a fever of more than 100.5.  Document Released: 08/15/2005 Document Re-Released: 09/06/2009 [facility] Patient Information 2011 [place], [place].Edema Edema is an abnormal build-up of fluids in tissues. Because this is partly dependent on gravity (water flows to the lowest place), it is more common in the lower extremities (legs and thighs). It is also common in the looser tissues, like around the eyes. Painless swelling of the feet and ankles is common and increases as a person ages. It may affect both legs and may include the calves or even thighs. When squeezed, the fluid may move out of the affected area and may leave a dent for a few moments. CAUSES  Prolonged standing or sitting in one place for extended periods of time. Movement helps pump tissue fluid into the veins, and absence of movement prevents this, resulting in edema.   Varicose veins. The valves in the veins do not work as well as they should. This causes fluid to leak into the tissues.   Fluid and salt overload.   Injury, burn, or surgery to the leg, ankle, or foot, may damage veins and allow fluid to leak out.   Sunburn damages vessels. Leaky vessels allow fluid to go out into the sunburned tissues.   Allergies (from insect bites or stings,  medications or chemicals) cause swelling by allowing vessels to become leaky.   Protein in the blood helps keep fluid in your vessels. Low protein, as in malnutrition, allows fluid to leak out.   Hormonal changes, including pregnancy and menstruation, cause fluid retention. This fluid may leak out of vessels and cause edema.   Medications that cause fluid retention. Examples are sex hormones, blood pressure medications, steroid treatment, or anti-depressants.   Some illnesses cause edema, especially heart failure, kidney disease, or liver disease.    Surgery that cuts veins or lymph nodes, such as surgery done for the heart or for breast cancer, may result in edema.  DIAGNOSIS Your caregiver is usually easily able to determine what is causing your swelling (edema) by simply asking what is wrong (getting a history) and examining you (doing a physical). Sometimes x-rays, EKG (electrocardiogram or heart tracing), and blood work may be done to evaluate for underlying medical illness. TREATMENT General treatment includes:  Leg elevation (or elevation of the affected body part).   Restriction of fluid intake.   Prevention of fluid overload.   Compression of the affected body part. Compression with elastic bandages or support stockings squeezes the tissues, preventing fluid from entering and forcing it back into the blood vessels.   Diuretics (also called water pills or fluid pills) pull fluid out of your body in the form of increased urination. These are effective in reducing the swelling, but can have side effects and must be used only under your caregiver's supervision. Diuretics are appropriate only for some types of edema.  The specific treatment can be directed at any underlying causes discovered. Heart, liver, or kidney disease should be treated appropriately. HOME CARE INSTRUCTIONS  Elevate the legs (or affected body part) above the level of the heart, while lying down.   Avoid sitting or standing still for prolonged periods of time.   Avoid putting anything directly under the knees when lying down, and do not wear constricting clothing or garters on the upper legs.   Exercising the legs causes the fluid to work back into the veins and lymphatic channels. This may help the swelling go down.   The pressure applied by elastic bandages or support stockings can help reduce ankle swelling.   A low-salt diet may help reduce fluid retention and decrease the ankle swelling.   Take any medications exactly as prescribed.  SEEK MEDICAL  CARE IF:  Your edema is not responding to recommended treatments.  SEEK IMMEDIATE MEDICAL CARE IF:  You develop shortness of breath or chest pain.   You cannot breathe when you lay down; or if, while lying down, you have to get up and go to the window to get your breath.   You are having increasing swelling without relief from treatment.   You develop a fever over 100.5.   You develop pain or redness in the areas that are swollen.   Tell your caregiver right away if you have gained 2 lb in 1 day or 5 lb in a week.  MAKE SURE YOU:  Understand these instructions.   Will watch your condition.   Will get help right away if you are not doing well or get worse.  Document Released: 08/15/2005 Document Re-Released: 02/02/2010 [facility] Patient Information 2011 [place], [place].

## 2011-01-21 ENCOUNTER — Encounter: Payer: Self-pay | Admitting: Internal Medicine

## 2011-01-24 NOTE — Assessment & Plan Note (Signed)
Will check his A1C to see how well his BS control is and monitor his renal function

## 2011-01-24 NOTE — Assessment & Plan Note (Signed)
I will check labs to look for CHF and other secondary causes and will start HCTZ for this

## 2011-01-24 NOTE — Assessment & Plan Note (Signed)
He is doing well on Livalo

## 2011-01-24 NOTE — Assessment & Plan Note (Signed)
Start MicardisHCT

## 2011-04-05 ENCOUNTER — Encounter: Payer: Self-pay | Admitting: Internal Medicine

## 2011-04-05 ENCOUNTER — Other Ambulatory Visit (INDEPENDENT_AMBULATORY_CARE_PROVIDER_SITE_OTHER): Payer: Medicare Other

## 2011-04-05 ENCOUNTER — Ambulatory Visit (INDEPENDENT_AMBULATORY_CARE_PROVIDER_SITE_OTHER): Payer: Medicare Other | Admitting: Internal Medicine

## 2011-04-05 DIAGNOSIS — I10 Essential (primary) hypertension: Secondary | ICD-10-CM

## 2011-04-05 DIAGNOSIS — Z79899 Other long term (current) drug therapy: Secondary | ICD-10-CM

## 2011-04-05 DIAGNOSIS — E119 Type 2 diabetes mellitus without complications: Secondary | ICD-10-CM

## 2011-04-05 DIAGNOSIS — D538 Other specified nutritional anemias: Secondary | ICD-10-CM

## 2011-04-05 DIAGNOSIS — R609 Edema, unspecified: Secondary | ICD-10-CM

## 2011-04-05 LAB — CBC WITH DIFFERENTIAL/PLATELET
Basophils Relative: 0.4 % (ref 0.0–3.0)
Eosinophils Relative: 1.5 % (ref 0.0–5.0)
HCT: 43.7 % (ref 39.0–52.0)
Lymphs Abs: 3.1 10*3/uL (ref 0.7–4.0)
MCV: 86.4 fl (ref 78.0–100.0)
Monocytes Absolute: 0.7 10*3/uL (ref 0.1–1.0)
Neutro Abs: 5.4 10*3/uL (ref 1.4–7.7)
RBC: 5.05 Mil/uL (ref 4.22–5.81)
WBC: 9.4 10*3/uL (ref 4.5–10.5)

## 2011-04-05 LAB — COMPREHENSIVE METABOLIC PANEL
Alkaline Phosphatase: 81 U/L (ref 39–117)
BUN: 19 mg/dL (ref 6–23)
Glucose, Bld: 95 mg/dL (ref 70–99)
Total Bilirubin: 1.1 mg/dL (ref 0.3–1.2)

## 2011-04-05 LAB — TSH: TSH: 1.84 u[IU]/mL (ref 0.35–5.50)

## 2011-04-05 LAB — HEMOGLOBIN A1C: Hgb A1c MFr Bld: 9.1 % — ABNORMAL HIGH (ref 4.6–6.5)

## 2011-04-05 MED ORDER — SAXAGLIPTIN-METFORMIN ER 5-500 MG PO TB24: 1.0000 | ORAL_TABLET | Freq: Every day | ORAL | Status: DC

## 2011-04-05 NOTE — Progress Notes (Signed)
Subjective:    Patient ID: Nicholas Burton, male    DOB: Feb 03, 1928, 77 y.o.   MRN: [id number]  Diabetes He presents for his follow-up diabetic visit. He has type 2 diabetes mellitus. His disease course has been fluctuating. There are no hypoglycemic associated symptoms. Pertinent negatives for hypoglycemia include no dizziness, headaches, pallor, seizures, speech difficulty or tremors. Pertinent negatives for diabetes include no blurred vision, no chest pain, no fatigue, no foot paresthesias, no foot ulcerations, no polydipsia, no polyphagia, no polyuria, no visual change, no weakness and no weight loss. There are no hypoglycemic complications. Symptoms are stable. There are no diabetic complications. Current diabetic treatment includes intensive insulin program and oral agent (monotherapy). He is compliant with treatment all of the time. His weight is stable. He is following a generally healthy diet. Meal planning includes avoidance of concentrated sweets. He has not had a previous visit with a dietician. He never participates in exercise. His home blood glucose trend is increasing steadily. His breakfast blood glucose range is generally 140-180 mg/dl. His lunch blood glucose range is generally 140-180 mg/dl. His dinner blood glucose range is generally 140-180 mg/dl. His highest blood glucose is >200 mg/dl. His overall blood glucose range is 140-180 mg/dl. An ACE inhibitor/angiotensin II receptor blocker is being taken. He does not see a podiatrist.Eye exam is current.      Review of Systems  Constitutional: Negative for fever, chills, weight loss, diaphoresis, activity change, appetite change, fatigue and unexpected weight change.  HENT: Negative for sore throat, facial swelling, trouble swallowing, neck pain, neck stiffness and voice change.   Eyes: Negative for blurred vision, photophobia, redness and visual disturbance.  Respiratory: Negative for apnea, cough, choking, chest tightness, shortness of  breath, wheezing and stridor.   Cardiovascular: Negative for chest pain, palpitations and leg swelling.  Gastrointestinal: Negative for nausea, vomiting, abdominal pain, diarrhea and constipation.  Genitourinary: Negative for dysuria, urgency, polyuria, frequency, hematuria, flank pain, decreased urine volume, enuresis and difficulty urinating.  Musculoskeletal: Positive for arthralgias (both knees). Negative for myalgias, back pain, joint swelling and gait problem.  Skin: Negative for color change, pallor, rash and wound.  Neurological: Negative for dizziness, tremors, seizures, syncope, facial asymmetry, speech difficulty, weakness, light-headedness, numbness and headaches.  Hematological: Negative for polydipsia, polyphagia and adenopathy. Does not bruise/bleed easily.  Psychiatric/Behavioral: Negative.        Objective:   Physical Exam  Vitals reviewed. Constitutional: He is oriented to person, place, and time. He appears well-developed and well-nourished. No distress.  HENT:  Head: Normocephalic and atraumatic.  Right Ear: External ear normal.  Left Ear: External ear normal.  Nose: Nose normal.  Mouth/Throat: Oropharynx is clear and moist. No oropharyngeal exudate.  Eyes: Conjunctivae and EOM are normal. Pupils are equal, round, and reactive to light. Right eye exhibits no discharge. Left eye exhibits no discharge. No scleral icterus.  Neck: Normal range of motion. Neck supple. No JVD present. No tracheal deviation present. No thyromegaly present.  Cardiovascular: Normal rate, regular rhythm, normal heart sounds and intact distal pulses.  Exam reveals no gallop and no friction rub.   No murmur heard. Pulmonary/Chest: Effort normal and breath sounds normal. No stridor. No respiratory distress. He has no wheezes. He has no rales. He exhibits no tenderness.  Abdominal: Soft. Bowel sounds are normal. He exhibits no distension and no mass. There is no tenderness. There is no rebound and no  guarding.  Musculoskeletal: Normal range of motion. He exhibits edema (1+ edema in both  legs). He exhibits no tenderness.  Lymphadenopathy:    He has no cervical adenopathy.  Neurological: He is alert and oriented to person, place, and time. He has normal reflexes.  Skin: Skin is warm and dry. No rash noted. He is not diaphoretic. No erythema. No pallor.  Psychiatric: He has a normal mood and affect. His behavior is normal. Judgment and thought content normal.      Lab Results  Component Value Date   WBC 7.9 01/18/2011   HGB 14.1 01/18/2011   HCT 41.4 01/18/2011   PLT 218.0 01/18/2011   CHOL 120 11/10/2010   TRIG 63.0 11/10/2010   HDL 42.50 11/10/2010   ALT 21 01/18/2011   AST 23 01/18/2011   NA 140 01/18/2011   K 4.7 01/18/2011   CL 107 01/18/2011   CREATININE 0.9 01/18/2011   BUN 20 01/18/2011   CO2 28 01/18/2011   TSH 1.49 01/18/2011   INR 1.3 ratio* 08/11/2010   HGBA1C 8.7* 01/18/2011   MICROALBUR 1.2 01/07/2010      Assessment & Plan:

## 2011-04-05 NOTE — Patient Instructions (Signed)
Diabetes, Type 2 Diabetes is a lasting (chronic) disease. In type 2 diabetes, the pancreas does not make enough insulin (a hormone), and the body does not respond normally to the insulin that is made. This type of diabetes was also previously called adult onset diabetes. About 90% of all those who have diabetes have type 2. It usually occurs after the age of 40 but can occur at any age. CAUSES Unlike type 1 diabetes, which happens because insulin is no longer being made, type 2 diabetes happens because the body is making less insulin and has trouble using the insulin properly. SYMPTOMS  Drinking more than usual.   Urinating more than usual.   Blurred vision.   Dry, itchy skin.   Frequent infection like yeast infections in women.   More tired than usual (fatigue).  TREATMENT  Healthy eating.   Exercise.   Medication, if needed.   Monitoring blood glucose (sugar).   Seeing your caregiver regularly.  HOME CARE INSTRUCTIONS  Check your blood glucose (sugar) at least once daily. More frequent monitoring may be necessary, depending on your medications and on how well your diabetes is controlled. Your caregiver will advise you.   Take your medicine as directed by your caregiver.   Do not smoke.   Make wise food choices. Ask your caregiver for information. Weight loss can improve your diabetes.   Learn about low blood glucose (hypoglycemia) and how to treat it.   Get your eyes checked regularly.   Have a yearly physical exam. Have your blood pressure checked. Get your blood and urine tested.   Wear a pendant or bracelet saying that you have diabetes.   Check your feet every night for sores. Let your caregiver know if you have sores that are not healing.  SEEK MEDICAL CARE IF:  You are having problems keeping your blood glucose at target range.   You feel you might be having problems with your medicines.   You have symptoms of an illness that is not improving after 24  hours.   You have a sore or wound that is not healing.   You notice a change in vision or a new problem with your vision.   You develop a fever of more than 100.5.  Document Released: 08/15/2005 Document Re-Released: 09/06/2009 [employer] Patient Information 2011 [employer]. 

## 2011-04-06 ENCOUNTER — Encounter: Payer: Self-pay | Admitting: Internal Medicine

## 2011-04-06 NOTE — Assessment & Plan Note (Signed)
This is unchanged 

## 2011-04-06 NOTE — Assessment & Plan Note (Addendum)
I will add metformin to get better blood sugar control, I will check his A1C and renal function today

## 2011-04-06 NOTE — Assessment & Plan Note (Signed)
His BP is well controlled, I will check his lytes and renal function today 

## 2011-04-06 NOTE — Assessment & Plan Note (Signed)
I will check his CBC today

## 2011-04-13 ENCOUNTER — Ambulatory Visit: Payer: Medicare Other | Admitting: Internal Medicine

## 2011-06-07 ENCOUNTER — Other Ambulatory Visit (INDEPENDENT_AMBULATORY_CARE_PROVIDER_SITE_OTHER): Payer: Medicare Other

## 2011-06-07 ENCOUNTER — Ambulatory Visit (INDEPENDENT_AMBULATORY_CARE_PROVIDER_SITE_OTHER): Payer: Medicare Other | Admitting: Internal Medicine

## 2011-06-07 ENCOUNTER — Encounter: Payer: Self-pay | Admitting: Internal Medicine

## 2011-06-07 DIAGNOSIS — E119 Type 2 diabetes mellitus without complications: Secondary | ICD-10-CM

## 2011-06-07 DIAGNOSIS — I10 Essential (primary) hypertension: Secondary | ICD-10-CM

## 2011-06-07 DIAGNOSIS — R609 Edema, unspecified: Secondary | ICD-10-CM

## 2011-06-07 DIAGNOSIS — E785 Hyperlipidemia, unspecified: Secondary | ICD-10-CM

## 2011-06-07 DIAGNOSIS — E559 Vitamin D deficiency, unspecified: Secondary | ICD-10-CM

## 2011-06-07 LAB — COMPREHENSIVE METABOLIC PANEL
ALT: 28 U/L (ref 0–53)
AST: 31 U/L (ref 0–37)
Albumin: 3.8 g/dL (ref 3.5–5.2)
Alkaline Phosphatase: 100 U/L (ref 39–117)
Calcium: 8.9 mg/dL (ref 8.4–10.5)
Chloride: 103 mEq/L (ref 96–112)
Creatinine, Ser: 1 mg/dL (ref 0.4–1.5)
Potassium: 4.2 mEq/L (ref 3.5–5.1)

## 2011-06-07 LAB — HEMOGLOBIN A1C: Hgb A1c MFr Bld: 9 % — ABNORMAL HIGH (ref 4.6–6.5)

## 2011-06-07 MED ORDER — CHOLECALCIFEROL 10 MCG (400 UNIT) PO CAPS: 1.0000 | ORAL_CAPSULE | Freq: Once | ORAL | Status: DC

## 2011-06-07 NOTE — Assessment & Plan Note (Signed)
stable °

## 2011-06-07 NOTE — Assessment & Plan Note (Signed)
He is doing well on livalo 

## 2011-06-07 NOTE — Assessment & Plan Note (Signed)
I will recheck his A1C to see if he has good control of his blood sugars

## 2011-06-07 NOTE — Patient Instructions (Signed)
Diabetes, Type 2 Diabetes is a lasting (chronic) disease. In type 2 diabetes, the pancreas does not make enough insulin (a hormone), and the body does not respond normally to the insulin that is made. This type of diabetes was also previously called adult onset diabetes. About 90% of all those who have diabetes have type 2. It usually occurs after the age of 40 but can occur at any age. CAUSES Unlike type 1 diabetes, which happens because insulin is no longer being made, type 2 diabetes happens because the body is making less insulin and has trouble using the insulin properly. SYMPTOMS  Drinking more than usual.   Urinating more than usual.   Blurred vision.   Dry, itchy skin.   Frequent infection like yeast infections in women.   More tired than usual (fatigue).  TREATMENT  Healthy eating.   Exercise.   Medication, if needed.   Monitoring blood glucose (sugar).   Seeing your caregiver regularly.  HOME CARE INSTRUCTIONS  Check your blood glucose (sugar) at least once daily. More frequent monitoring may be necessary, depending on your medications and on how well your diabetes is controlled. Your caregiver will advise you.   Take your medicine as directed by your caregiver.   Do not smoke.   Make wise food choices. Ask your caregiver for information. Weight loss can improve your diabetes.   Learn about low blood glucose (hypoglycemia) and how to treat it.   Get your eyes checked regularly.   Have a yearly physical exam. Have your blood pressure checked. Get your blood and urine tested.   Wear a pendant or bracelet saying that you have diabetes.   Check your feet every night for sores. Let your caregiver know if you have sores that are not healing.  SEEK MEDICAL CARE IF:  You are having problems keeping your blood glucose at target range.   You feel you might be having problems with your medicines.   You have symptoms of an illness that is not improving after 24  hours.   You have a sore or wound that is not healing.   You notice a change in vision or a new problem with your vision.   You develop a fever of more than 100.5.  Document Released: 08/15/2005 Document Re-Released: 09/06/2009 [employer] Patient Information 2011 [employer]. 

## 2011-06-07 NOTE — Progress Notes (Signed)
Subjective:    Patient ID: Nicholas Burton, male    DOB: 09/09/1927, 77 y.o.   MRN: [id number]  Diabetes He presents for his follow-up diabetic visit. He has type 2 diabetes mellitus. His disease course has been stable. There are no hypoglycemic associated symptoms. Pertinent negatives for hypoglycemia include no dizziness, headaches, pallor, seizures, speech difficulty or tremors. Pertinent negatives for diabetes include no blurred vision, no chest pain, no fatigue, no foot paresthesias, no foot ulcerations, no polydipsia, no polyphagia, no polyuria, no visual change, no weakness and no weight loss. There are no hypoglycemic complications. Symptoms are stable. There are no diabetic complications. Current diabetic treatment includes intensive insulin program and oral agent (dual therapy). He is compliant with treatment all of the time. His weight is stable. He is following a generally healthy diet. Meal planning includes avoidance of concentrated sweets. He has had a previous visit with a dietician. He never participates in exercise. There is no change in his home blood glucose trend. His breakfast blood glucose range is generally 70-90 mg/dl. His lunch blood glucose range is generally 90-110 mg/dl. His dinner blood glucose range is generally 90-110 mg/dl. His highest blood glucose is 110-130 mg/dl. His overall blood glucose range is 90-110 mg/dl. An ACE inhibitor/angiotensin II receptor blocker is being taken. He does not see a podiatrist.Eye exam is current.      Review of Systems  Constitutional: Negative for fever, chills, weight loss, diaphoresis, activity change, appetite change, fatigue and unexpected weight change.  HENT: Negative.   Eyes: Negative.  Negative for blurred vision.  Respiratory: Negative for apnea, cough, choking, chest tightness, shortness of breath, wheezing and stridor.   Cardiovascular: Negative for chest pain, palpitations and leg swelling.  Gastrointestinal: Negative for  nausea, vomiting, abdominal pain, diarrhea, constipation, abdominal distention and anal bleeding.  Genitourinary: Negative for dysuria, urgency, polyuria, frequency, hematuria, flank pain, decreased urine volume, enuresis and difficulty urinating.  Musculoskeletal: Negative for myalgias, back pain, joint swelling, arthralgias and gait problem.  Skin: Negative for color change, pallor, rash and wound.  Neurological: Negative for dizziness, tremors, seizures, syncope, facial asymmetry, speech difficulty, weakness, light-headedness, numbness and headaches.  Hematological: Negative for polydipsia, polyphagia and adenopathy. Does not bruise/bleed easily.  Psychiatric/Behavioral: Negative.        Objective:   Physical Exam  Vitals reviewed. Constitutional: He is oriented to person, place, and time. He appears well-developed. No distress.  HENT:  Mouth/Throat: Oropharynx is clear and moist. No oropharyngeal exudate.  Eyes: Conjunctivae are normal. Right eye exhibits no discharge. Left eye exhibits no discharge. No scleral icterus.  Neck: Normal range of motion. Neck supple. No JVD present. No tracheal deviation present. No thyromegaly present.  Cardiovascular: Normal rate, regular rhythm, normal heart sounds and intact distal pulses.  Exam reveals no gallop and no friction rub.   No murmur heard. Pulmonary/Chest: Effort normal and breath sounds normal. No stridor. No respiratory distress. He has no wheezes. He has no rales. He exhibits no tenderness.  Abdominal: Soft. Bowel sounds are normal. He exhibits no distension and no mass. There is no tenderness. There is no rebound and no guarding.  Musculoskeletal: Normal range of motion. He exhibits edema (trace edema in both legs). He exhibits no tenderness.  Lymphadenopathy:    He has no cervical adenopathy.  Neurological: He is oriented to person, place, and time. He displays normal reflexes. He exhibits normal muscle tone. Coordination normal.    Skin: Skin is warm and dry. No rash noted. He  is not diaphoretic. No erythema. No pallor.  Psychiatric: He has a normal mood and affect. His behavior is normal. Judgment and thought content normal.      Lab Results  Component Value Date   WBC 9.4 04/05/2011   HGB 14.7 04/05/2011   HCT 43.7 04/05/2011   PLT 225.0 04/05/2011   GLUCOSE 95 04/05/2011   CHOL 120 11/10/2010   TRIG 63.0 11/10/2010   HDL 42.50 11/10/2010   LDLCALC 65 11/10/2010   ALT 23 04/05/2011   AST 27 04/05/2011   NA 142 04/05/2011   K 4.5 04/05/2011   CL 104 04/05/2011   CREATININE 1.0 04/05/2011   BUN 19 04/05/2011   CO2 31 04/05/2011   TSH 1.84 04/05/2011   INR 1.3 ratio* 08/11/2010   HGBA1C 9.1* 04/05/2011   MICROALBUR 1.2 01/07/2010      Assessment & Plan:

## 2011-06-07 NOTE — Assessment & Plan Note (Signed)
His BP is well controlled 

## 2011-06-08 ENCOUNTER — Encounter: Payer: Self-pay | Admitting: Internal Medicine

## 2011-08-05 ENCOUNTER — Ambulatory Visit: Payer: Medicare Other | Admitting: Internal Medicine

## 2011-08-31 ENCOUNTER — Other Ambulatory Visit: Payer: Self-pay | Admitting: Internal Medicine

## 2011-09-08 ENCOUNTER — Other Ambulatory Visit (INDEPENDENT_AMBULATORY_CARE_PROVIDER_SITE_OTHER): Payer: Medicare Other

## 2011-09-08 ENCOUNTER — Ambulatory Visit (INDEPENDENT_AMBULATORY_CARE_PROVIDER_SITE_OTHER): Payer: Medicare Other | Admitting: Internal Medicine

## 2011-09-08 ENCOUNTER — Encounter: Payer: Self-pay | Admitting: Internal Medicine

## 2011-09-08 DIAGNOSIS — R609 Edema, unspecified: Secondary | ICD-10-CM

## 2011-09-08 DIAGNOSIS — D538 Other specified nutritional anemias: Secondary | ICD-10-CM

## 2011-09-08 DIAGNOSIS — N400 Enlarged prostate without lower urinary tract symptoms: Secondary | ICD-10-CM

## 2011-09-08 DIAGNOSIS — I10 Essential (primary) hypertension: Secondary | ICD-10-CM

## 2011-09-08 DIAGNOSIS — E538 Deficiency of other specified B group vitamins: Secondary | ICD-10-CM

## 2011-09-08 DIAGNOSIS — E785 Hyperlipidemia, unspecified: Secondary | ICD-10-CM

## 2011-09-08 DIAGNOSIS — E119 Type 2 diabetes mellitus without complications: Secondary | ICD-10-CM | POA: Diagnosis not present

## 2011-09-08 LAB — LIPID PANEL
HDL: 46.3 mg/dL (ref >39.00)
LDL Cholesterol: 70 mg/dL (ref 0–99)
Total CHOL/HDL Ratio: 3
VLDL: 14 mg/dL (ref 0.0–40.0)

## 2011-09-08 LAB — CBC WITH DIFFERENTIAL/PLATELET
Eosinophils Relative: 2.6 % (ref 0.0–5.0)
HCT: 42.3 % (ref 39.0–52.0)
Lymphocytes Relative: 34 % (ref 12.0–46.0)
Monocytes Relative: 9.1 % (ref 3.0–12.0)
Neutrophils Relative %: 53.8 % (ref 43.0–77.0)
Platelets: 223 10*3/uL (ref 150.0–400.0)
WBC: 8.6 10*3/uL (ref 4.5–10.5)

## 2011-09-08 LAB — URINALYSIS, ROUTINE W REFLEX MICROSCOPIC
Bilirubin Urine: NEGATIVE
Ketones, ur: NEGATIVE
Leukocytes, UA: NEGATIVE
Urine Glucose: NEGATIVE
Urobilinogen, UA: 0.2 (ref 0.0–1.0)

## 2011-09-08 LAB — VITAMIN B12: Vitamin B-12: 1010 pg/mL — ABNORMAL HIGH (ref 211–911)

## 2011-09-08 LAB — COMPREHENSIVE METABOLIC PANEL
ALT: 24 U/L (ref 0–53)
AST: 27 U/L (ref 0–37)
CO2: 28 mEq/L (ref 19–32)
Calcium: 8.8 mg/dL (ref 8.4–10.5)
Chloride: 106 mEq/L (ref 96–112)
GFR: 73.54 mL/min (ref >60.00)
Potassium: 4.5 mEq/L (ref 3.5–5.1)
Sodium: 142 mEq/L (ref 135–145)
Total Protein: 6.4 g/dL (ref 6.0–8.3)

## 2011-09-08 LAB — HEMOGLOBIN A1C: Hgb A1c MFr Bld: 8.7 % — ABNORMAL HIGH (ref 4.6–6.5)

## 2011-09-08 MED ORDER — TELMISARTAN-HCTZ 80-12.5 MG PO TABS: 1.0000 | ORAL_TABLET | Freq: Every day | ORAL | Status: DC

## 2011-09-08 MED ORDER — PITAVASTATIN CALCIUM 2 MG PO TABS: 1.0000 | ORAL_TABLET | Freq: Every day | ORAL | Status: DC

## 2011-09-08 MED ORDER — INSULIN GLARGINE 100 UNIT/ML ~~LOC~~ SOLN: 25.0000 [IU] | Freq: Every day | SUBCUTANEOUS | Status: DC

## 2011-09-08 NOTE — Patient Instructions (Signed)

## 2011-09-08 NOTE — Progress Notes (Signed)
Subjective:    Patient ID: Nicholas Burton, male    DOB: 03/14/1928, 78 y.o.   MRN: [id number]  Diabetes He presents for his follow-up diabetic visit. He has type 2 diabetes mellitus. His disease course has been stable. Hypoglycemia symptoms include confusion (when blood sugar goes down to 50), hunger and sweats. Pertinent negatives for hypoglycemia include no dizziness, headaches, mood changes, nervousness/anxiousness, pallor, seizures, sleepiness, speech difficulty or tremors. (Blood sugar down to about 50 at 2-3 am for the last 3 nights) Pertinent negatives for diabetes include no blurred vision, no chest pain, no fatigue, no foot paresthesias, no foot ulcerations, no polydipsia, no polyphagia, no polyuria, no visual change, no weakness and no weight loss. Hypoglycemia complications include nocturnal hypoglycemia. Pertinent negatives for hypoglycemia complications include no blackouts, no hospitalization, no required assistance and no required glucagon injection. Current diabetic treatment includes oral agent (dual therapy) and intensive insulin program. His weight is increasing steadily. He is following a generally healthy diet. Meal planning includes avoidance of concentrated sweets. He has not had a previous visit with a dietician. He never participates in exercise. There is no change in his home blood glucose trend. His breakfast blood glucose range is generally 70-90 mg/dl. His lunch blood glucose range is generally 90-110 mg/dl. His dinner blood glucose range is generally 110-130 mg/dl. His highest blood glucose is >200 mg/dl. His overall blood glucose range is 130-140 mg/dl. An ACE inhibitor/angiotensin II receptor blocker is being taken. He does not see a podiatrist.Eye exam is current.  Hypertension This is a chronic problem. The current episode started more than 1 year ago. The problem has been gradually improving since onset. The problem is controlled. Associated symptoms include peripheral edema  and sweats. Pertinent negatives include no anxiety, blurred vision, chest pain, headaches, malaise/fatigue, neck pain, orthopnea, palpitations, PND or shortness of breath. There are no associated agents to hypertension. Past treatments include angiotensin blockers and diuretics. The current treatment provides moderate improvement. Compliance problems include psychosocial issues, exercise and diet.       Review of Systems  Constitutional: Negative for fever, chills, weight loss, malaise/fatigue, diaphoresis, activity change, appetite change, fatigue and unexpected weight change.  HENT: Negative.  Negative for sore throat, trouble swallowing, neck pain and voice change.   Eyes: Negative.  Negative for blurred vision.  Respiratory: Negative for cough, chest tightness, shortness of breath, wheezing and stridor.   Cardiovascular: Negative for chest pain, palpitations, orthopnea, leg swelling and PND.  Gastrointestinal: Negative for nausea, vomiting, abdominal pain, diarrhea and constipation.  Genitourinary: Negative for dysuria, urgency, polyuria, frequency, hematuria, flank pain, decreased urine volume, enuresis and difficulty urinating.  Musculoskeletal: Negative for myalgias, back pain, joint swelling, arthralgias and gait problem.  Skin: Negative for color change, pallor, rash and wound.  Neurological: Negative for dizziness, tremors, seizures, syncope, facial asymmetry, speech difficulty, weakness, light-headedness, numbness and headaches.  Hematological: Negative for polydipsia, polyphagia and adenopathy. Does not bruise/bleed easily.  Psychiatric/Behavioral: Positive for confusion (when blood sugar goes down to 50). Negative for suicidal ideas, hallucinations, behavioral problems, sleep disturbance, self-injury, dysphoric mood, decreased concentration and agitation. The patient is not nervous/anxious and is not hyperactive.        Objective:   Physical Exam  Vitals reviewed. Constitutional:  He is oriented to person, place, and time. He appears well-developed and well-nourished. No distress.  HENT:  Head: Normocephalic and atraumatic.  Mouth/Throat: Oropharynx is clear and moist. No oropharyngeal exudate.  Eyes: Conjunctivae are normal. Right eye exhibits no discharge.  Left eye exhibits no discharge. No scleral icterus.  Neck: Normal range of motion. Neck supple. No JVD present. No tracheal deviation present. No thyromegaly present.  Cardiovascular: Normal rate, regular rhythm, normal heart sounds and intact distal pulses.  Exam reveals no gallop and no friction rub.   No murmur heard. Pulmonary/Chest: Effort normal and breath sounds normal. No stridor. No respiratory distress. He has no wheezes. He has no rales. He exhibits no tenderness.  Abdominal: Soft. Bowel sounds are normal. He exhibits no distension. There is no tenderness. There is no rebound and no guarding.  Musculoskeletal: Normal range of motion. He exhibits edema (1-2 + pitting edema in both legs). He exhibits no tenderness.  Lymphadenopathy:    He has no cervical adenopathy.  Neurological: He is oriented to person, place, and time.  Skin: Skin is warm and dry. No rash noted. He is not diaphoretic. No erythema. No pallor.  Psychiatric: He has a normal mood and affect. His behavior is normal. Judgment and thought content normal.      Lab Results  Component Value Date   WBC 9.4 04/05/2011   HGB 14.7 04/05/2011   HCT 43.7 04/05/2011   PLT 225.0 04/05/2011   GLUCOSE 119* 06/07/2011   CHOL 120 11/10/2010   TRIG 63.0 11/10/2010   HDL 42.50 11/10/2010   LDLCALC 65 11/10/2010   ALT 28 06/07/2011   AST 31 06/07/2011   NA 141 06/07/2011   K 4.2 06/07/2011   CL 103 06/07/2011   CREATININE 1.0 06/07/2011   BUN 17 06/07/2011   CO2 32 06/07/2011   TSH 1.84 04/05/2011   INR 1.3 ratio* 08/11/2010   HGBA1C 9.0* 06/07/2011   MICROALBUR 1.2 01/07/2010      Assessment & Plan:

## 2011-09-11 NOTE — Assessment & Plan Note (Signed)
Worsened recently by excess sodium intake, he has no evidence of CHF

## 2011-09-11 NOTE — Assessment & Plan Note (Signed)
Check his CBC and his vitamin levels today

## 2011-09-11 NOTE — Assessment & Plan Note (Signed)
He describes some nocturnal hypoglycemia so I have asked him to reduce his dose of lantus from 35 u to 25 u, today I will check his a1c and will monitor his renal function

## 2011-09-11 NOTE — Assessment & Plan Note (Signed)
Continue current meds, decrease sodium intake, check lytes and renal function today

## 2011-10-28 DIAGNOSIS — Z961 Presence of intraocular lens: Secondary | ICD-10-CM | POA: Diagnosis not present

## 2011-10-28 DIAGNOSIS — H353190 Nonexudative age-related macular degeneration, unspecified eye, stage unspecified: Secondary | ICD-10-CM | POA: Diagnosis not present

## 2011-10-28 DIAGNOSIS — E119 Type 2 diabetes mellitus without complications: Secondary | ICD-10-CM | POA: Diagnosis not present

## 2011-10-28 LAB — HM DIABETES EYE EXAM

## 2011-12-07 ENCOUNTER — Encounter: Payer: Self-pay | Admitting: Internal Medicine

## 2011-12-07 ENCOUNTER — Other Ambulatory Visit (INDEPENDENT_AMBULATORY_CARE_PROVIDER_SITE_OTHER): Payer: Medicare Other

## 2011-12-07 ENCOUNTER — Ambulatory Visit (INDEPENDENT_AMBULATORY_CARE_PROVIDER_SITE_OTHER): Payer: Medicare Other | Admitting: Internal Medicine

## 2011-12-07 VITALS — BP 118/68 | HR 70 | Temp 98.0°F | Resp 16 | Wt 207.5 lb

## 2011-12-07 DIAGNOSIS — R609 Edema, unspecified: Secondary | ICD-10-CM | POA: Diagnosis not present

## 2011-12-07 DIAGNOSIS — I10 Essential (primary) hypertension: Secondary | ICD-10-CM

## 2011-12-07 DIAGNOSIS — E119 Type 2 diabetes mellitus without complications: Secondary | ICD-10-CM | POA: Diagnosis not present

## 2011-12-07 DIAGNOSIS — E785 Hyperlipidemia, unspecified: Secondary | ICD-10-CM

## 2011-12-07 LAB — COMPREHENSIVE METABOLIC PANEL
ALT: 21 U/L (ref 0–53)
AST: 24 U/L (ref 0–37)
Albumin: 4 g/dL (ref 3.5–5.2)
Alkaline Phosphatase: 90 U/L (ref 39–117)
Calcium: 9.2 mg/dL (ref 8.4–10.5)
Chloride: 105 mEq/L (ref 96–112)
Potassium: 4.1 mEq/L (ref 3.5–5.1)
Sodium: 143 mEq/L (ref 135–145)

## 2011-12-07 MED ORDER — COLESEVELAM HCL 3.75 G PO PACK: 1.0000 | PACK | Freq: Every day | ORAL | Status: DC

## 2011-12-07 MED ORDER — PITAVASTATIN CALCIUM 2 MG PO TABS: 1.0000 | ORAL_TABLET | Freq: Every day | ORAL | Status: DC

## 2011-12-07 MED ORDER — GLUCOSE BLOOD VI STRP: ORAL_STRIP | Status: DC

## 2011-12-07 MED ORDER — SAXAGLIPTIN-METFORMIN ER 5-500 MG PO TB24: 1.0000 | ORAL_TABLET | Freq: Every day | ORAL | Status: DC

## 2011-12-07 MED ORDER — TELMISARTAN-HCTZ 80-12.5 MG PO TABS: 1.0000 | ORAL_TABLET | Freq: Every day | ORAL | Status: DC

## 2011-12-07 NOTE — Patient Instructions (Signed)

## 2011-12-07 NOTE — Assessment & Plan Note (Signed)
His BP is well controlled, I will check his lytes and renal function 

## 2011-12-07 NOTE — Assessment & Plan Note (Signed)
He is doing well in livalo, I have added welchol to help lower his LDL

## 2011-12-07 NOTE — Progress Notes (Signed)
Subjective:    Patient ID: Nicholas Burton, male    DOB: [birth year], 78 y.o.   MRN: [id number]  Diabetes He presents for his follow-up diabetic visit. He has type 2 diabetes mellitus. His disease course has been fluctuating. There are no hypoglycemic associated symptoms. Pertinent negatives for hypoglycemia include no pallor. Pertinent negatives for diabetes include no blurred vision, no chest pain, no fatigue, no foot paresthesias, no foot ulcerations, no polydipsia, no polyphagia, no polyuria, no visual change, no weakness and no weight loss. There are no hypoglycemic complications. Symptoms are stable. There are no diabetic complications. Current diabetic treatment includes intensive insulin program and oral agent (dual therapy). He is compliant with treatment all of the time. His weight is stable. He is following a generally healthy diet. Meal planning includes avoidance of concentrated sweets. He never participates in exercise. His home blood glucose trend is increasing steadily. His breakfast blood glucose range is generally 70-90 mg/dl. His lunch blood glucose range is generally 110-130 mg/dl. His dinner blood glucose range is generally 140-180 mg/dl. His highest blood glucose is >200 mg/dl. His overall blood glucose range is 140-180 mg/dl. An ACE inhibitor/angiotensin II receptor blocker is being taken. He does not see a podiatrist.Eye exam is current.      Review of Systems  Constitutional: Negative for fever, chills, weight loss, diaphoresis, activity change, appetite change, fatigue and unexpected weight change.  HENT: Negative.   Eyes: Negative.  Negative for blurred vision.  Respiratory: Negative for apnea, cough, chest tightness, shortness of breath, wheezing and stridor.   Cardiovascular: Negative for chest pain, palpitations and leg swelling.  Gastrointestinal: Negative for nausea, vomiting, abdominal pain, diarrhea, constipation, blood in stool and abdominal distention.  Genitourinary:  Negative.  Negative for polyuria.  Musculoskeletal: Negative for myalgias, back pain, joint swelling, arthralgias and gait problem.  Skin: Negative for color change, pallor, rash and wound.  Neurological: Negative.  Negative for weakness.  Hematological: Negative for polydipsia, polyphagia and adenopathy. Does not bruise/bleed easily.  Psychiatric/Behavioral: Negative.        Objective:   Physical Exam  Vitals reviewed. Constitutional: He is oriented to person, place, and time. He appears well-developed and well-nourished. No distress.  HENT:  Head: Normocephalic and atraumatic.  Mouth/Throat: Oropharynx is clear and moist. No oropharyngeal exudate.  Eyes: Conjunctivae are normal. Right eye exhibits no discharge. Left eye exhibits no discharge. No scleral icterus.  Neck: Normal range of motion. Neck supple. No JVD present. No tracheal deviation present. No thyromegaly present.  Cardiovascular: Normal rate, regular rhythm, normal heart sounds and intact distal pulses.  Exam reveals no gallop and no friction rub.   No murmur heard. Pulmonary/Chest: Effort normal and breath sounds normal. No stridor. No respiratory distress. He has no wheezes. He has no rales. He exhibits no tenderness.  Abdominal: Soft. Bowel sounds are normal. He exhibits no distension and no mass. There is no tenderness. There is no rebound and no guarding.  Musculoskeletal: Normal range of motion. He exhibits edema (1+ edema in BLE's). He exhibits no tenderness.  Lymphadenopathy:    He has no cervical adenopathy.  Neurological: He is oriented to person, place, and time.  Skin: Skin is warm and dry. No rash noted. He is not diaphoretic. No erythema. No pallor.  Psychiatric: He has a normal mood and affect. His behavior is normal. Judgment and thought content normal.      Lab Results  Component Value Date   WBC 8.6 09/08/2011   HGB 14.4 09/08/2011  HCT 42.3 09/08/2011   PLT 223.0 09/08/2011   GLUCOSE 98 09/08/2011    CHOL 130 09/08/2011   TRIG 70.0 09/08/2011   HDL 46.30 09/08/2011   LDLCALC 70 09/08/2011   ALT 24 09/08/2011   AST 27 09/08/2011   NA 142 09/08/2011   K 4.5 09/08/2011   CL 106 09/08/2011   CREATININE 1.0 09/08/2011   BUN 15 09/08/2011   CO2 28 09/08/2011   TSH 1.84 04/05/2011   INR 1.3 ratio* 08/11/2010   HGBA1C 8.7* 09/08/2011   MICROALBUR 1.2 01/07/2010      Assessment & Plan:

## 2011-12-07 NOTE — Assessment & Plan Note (Signed)
His a1c remains too high so I have added welchol to his regimen

## 2011-12-09 ENCOUNTER — Encounter: Payer: Self-pay | Admitting: Internal Medicine

## 2012-03-19 DIAGNOSIS — Z85820 Personal history of malignant melanoma of skin: Secondary | ICD-10-CM | POA: Diagnosis not present

## 2012-03-19 DIAGNOSIS — C439 Malignant melanoma of skin, unspecified: Secondary | ICD-10-CM | POA: Diagnosis not present

## 2012-03-19 DIAGNOSIS — C4359 Malignant melanoma of other part of trunk: Secondary | ICD-10-CM | POA: Diagnosis not present

## 2012-04-10 ENCOUNTER — Encounter: Payer: Self-pay | Admitting: Internal Medicine

## 2012-04-10 ENCOUNTER — Ambulatory Visit (INDEPENDENT_AMBULATORY_CARE_PROVIDER_SITE_OTHER): Payer: Medicare Other | Admitting: Internal Medicine

## 2012-04-10 ENCOUNTER — Other Ambulatory Visit (INDEPENDENT_AMBULATORY_CARE_PROVIDER_SITE_OTHER): Payer: Medicare Other

## 2012-04-10 ENCOUNTER — Ambulatory Visit (INDEPENDENT_AMBULATORY_CARE_PROVIDER_SITE_OTHER)
Admission: RE | Admit: 2012-04-10 | Discharge: 2012-04-10 | Disposition: A | Discharge status: Home / Self Care | Payer: Medicare Other | Source: Ambulatory Visit | Attending: Internal Medicine | Admitting: Internal Medicine

## 2012-04-10 VITALS — BP 120/70 | HR 73 | Temp 98.1°F | Resp 14 | Wt 209.5 lb

## 2012-04-10 DIAGNOSIS — E119 Type 2 diabetes mellitus without complications: Secondary | ICD-10-CM

## 2012-04-10 DIAGNOSIS — E785 Hyperlipidemia, unspecified: Secondary | ICD-10-CM

## 2012-04-10 DIAGNOSIS — G8929 Other chronic pain: Secondary | ICD-10-CM | POA: Insufficient documentation

## 2012-04-10 DIAGNOSIS — M542 Cervicalgia: Secondary | ICD-10-CM

## 2012-04-10 DIAGNOSIS — I10 Essential (primary) hypertension: Secondary | ICD-10-CM

## 2012-04-10 LAB — BASIC METABOLIC PANEL
CO2: 28 mEq/L (ref 19–32)
Calcium: 8.9 mg/dL (ref 8.4–10.5)
GFR: 74.26 mL/min (ref >60.00)
Glucose, Bld: 90 mg/dL (ref 70–99)
Potassium: 4 mEq/L (ref 3.5–5.1)
Sodium: 139 mEq/L (ref 135–145)

## 2012-04-10 LAB — HM DIABETES FOOT EXAM: HM Diabetic Foot Exam: NORMAL

## 2012-04-10 MED ORDER — INSULIN GLARGINE 100 UNIT/ML ~~LOC~~ SOLN: 50.0000 [IU] | Freq: Every day | SUBCUTANEOUS | Status: DC

## 2012-04-10 MED ORDER — SAXAGLIPTIN-METFORMIN ER 2.5-1000 MG PO TB24: 2.0000 | ORAL_TABLET | Freq: Every day | ORAL | Status: DC

## 2012-04-10 MED ORDER — LOSARTAN POTASSIUM-HCTZ 100-12.5 MG PO TABS: 1.0000 | ORAL_TABLET | Freq: Every day | ORAL | Status: DC

## 2012-04-10 MED ORDER — ATORVASTATIN CALCIUM 40 MG PO TABS: 40.0000 mg | ORAL_TABLET | Freq: Every day | ORAL | Status: DC

## 2012-04-10 NOTE — Patient Instructions (Signed)

## 2012-04-10 NOTE — Progress Notes (Signed)
Subjective:    Patient ID: Nicholas Burton, male    DOB: 12-09-1927, 78 y.o.   MRN: [id number]  Neck Pain  This is a chronic problem. The current episode started more than 1 year ago. The problem occurs intermittently. The problem has been unchanged. The pain is associated with nothing. The pain is present in the midline. The quality of the pain is described as aching. The pain is at a severity of 2/10. The pain is mild. Nothing aggravates the symptoms. The pain is same all the time. Stiffness is present all day. Pertinent negatives include no chest pain, fever, headaches, leg pain, numbness, pain with swallowing, paresis, photophobia, syncope, tingling, trouble swallowing, visual change, weakness or weight loss. He has tried acetaminophen for the symptoms. The treatment provided moderate relief.      Review of Systems  Constitutional: Negative for fever, chills, weight loss, diaphoresis, fatigue and unexpected weight change.  HENT: Positive for neck pain. Negative for facial swelling, trouble swallowing and neck stiffness.   Eyes: Negative.  Negative for photophobia.  Respiratory: Positive for chest tightness. Negative for cough, shortness of breath and stridor.   Cardiovascular: Negative for chest pain, palpitations, leg swelling and syncope.  Gastrointestinal: Negative for nausea, abdominal pain, diarrhea and blood in stool.  Genitourinary: Negative for urgency, frequency, hematuria, flank pain, decreased urine volume, enuresis and difficulty urinating.  Musculoskeletal: Negative for myalgias, back pain, joint swelling, arthralgias and gait problem.  Skin: Negative for color change, pallor, rash and wound.  Neurological: Negative for dizziness, tingling, tremors, seizures, syncope, facial asymmetry, speech difficulty, weakness, light-headedness, numbness and headaches.  Hematological: Negative for adenopathy. Does not bruise/bleed easily.  Psychiatric/Behavioral: Negative.        Objective:    Physical Exam  Vitals reviewed. Constitutional: He is oriented to person, place, and time. He appears well-developed and well-nourished. No distress.  HENT:  Head: Normocephalic and atraumatic.  Mouth/Throat: Oropharynx is clear and moist. No oropharyngeal exudate.  Eyes: Conjunctivae and EOM are normal. Pupils are equal, round, and reactive to light. Right eye exhibits no discharge. Left eye exhibits no discharge. No scleral icterus.  Neck: Normal range of motion. Neck supple. No JVD present. No tracheal deviation present. No thyromegaly present.  Cardiovascular: Normal rate, regular rhythm, normal heart sounds and intact distal pulses.  Exam reveals no gallop and no friction rub.   No murmur heard. Pulmonary/Chest: Effort normal and breath sounds normal. No stridor. No respiratory distress. He has no wheezes. He has no rales. He exhibits no tenderness.  Abdominal: Soft. Bowel sounds are normal. He exhibits no distension and no mass. There is no tenderness. There is no rebound and no guarding.  Musculoskeletal: Normal range of motion. He exhibits edema (1+ pitting edema in BLE). He exhibits no tenderness.       Cervical back: Normal. He exhibits normal range of motion, no tenderness, no bony tenderness, no swelling, no edema, no deformity, no laceration, no pain, no spasm and normal pulse.  Lymphadenopathy:    He has no cervical adenopathy.  Neurological: He is alert and oriented to person, place, and time. He has normal reflexes. He displays normal reflexes. No cranial nerve deficit. He exhibits normal muscle tone. Coordination normal.  Skin: Skin is warm and dry. No rash noted. He is not diaphoretic. No erythema. No pallor.  Psychiatric: He has a normal mood and affect. His behavior is normal. Judgment and thought content normal.     Lab Results  Component Value Date  WBC 8.6 09/08/2011   HGB 14.4 09/08/2011   HCT 42.3 09/08/2011   PLT 223.0 09/08/2011   GLUCOSE 104* 12/07/2011   CHOL  130 09/08/2011   TRIG 70.0 09/08/2011   HDL 46.30 09/08/2011   LDLCALC 70 09/08/2011   ALT 21 12/07/2011   AST 24 12/07/2011   NA 143 12/07/2011   K 4.1 12/07/2011   CL 105 12/07/2011   CREATININE 1.0 12/07/2011   BUN 16 12/07/2011   CO2 30 12/07/2011   TSH 1.84 04/05/2011   INR 1.3 ratio* 08/11/2010   HGBA1C 9.7* 12/07/2011   MICROALBUR 1.2 01/07/2010       Assessment & Plan:

## 2012-04-11 ENCOUNTER — Encounter: Payer: Self-pay | Admitting: Internal Medicine

## 2012-04-11 NOTE — Assessment & Plan Note (Signed)
His pain xray shows DDD with some loss of height, he will continue taking tylenol for pain

## 2012-04-11 NOTE — Assessment & Plan Note (Signed)
His recent A1C was high showing poor control of DM so I have increased the doses of kombiglyze and lantus

## 2012-04-11 NOTE — Assessment & Plan Note (Signed)
His meds were changed to generics at his request, I will check his lytes and renal function today

## 2012-04-11 NOTE — Assessment & Plan Note (Signed)
The statin was changed to a generic at his request

## 2012-04-25 ENCOUNTER — Telehealth: Payer: Self-pay

## 2012-04-25 NOTE — Telephone Encounter (Signed)
Pt called requesting results of recent labs. Letter mailed 08/13 was for xray only.

## 2012-04-25 NOTE — Telephone Encounter (Signed)
Blood sugars are better with A1C down to 8.1 and the other labs look real good

## 2012-04-26 NOTE — Telephone Encounter (Signed)
Pt advised and copy of labs mailed for pt's records.

## 2012-05-24 ENCOUNTER — Ambulatory Visit (INDEPENDENT_AMBULATORY_CARE_PROVIDER_SITE_OTHER): Payer: Medicare Other

## 2012-05-24 DIAGNOSIS — Z23 Encounter for immunization: Secondary | ICD-10-CM

## 2012-06-03 ENCOUNTER — Ambulatory Visit: Payer: Medicare Other

## 2012-06-03 ENCOUNTER — Ambulatory Visit (INDEPENDENT_AMBULATORY_CARE_PROVIDER_SITE_OTHER): Payer: Medicare Other | Admitting: Family Medicine

## 2012-06-03 VITALS — BP 118/73 | HR 103 | Temp 100.1°F | Resp 16 | Ht 68.5 in | Wt 207.0 lb

## 2012-06-03 DIAGNOSIS — R059 Cough, unspecified: Secondary | ICD-10-CM | POA: Diagnosis not present

## 2012-06-03 DIAGNOSIS — R509 Fever, unspecified: Secondary | ICD-10-CM

## 2012-06-03 DIAGNOSIS — R05 Cough: Secondary | ICD-10-CM | POA: Diagnosis not present

## 2012-06-03 DIAGNOSIS — R0602 Shortness of breath: Secondary | ICD-10-CM

## 2012-06-03 DIAGNOSIS — J111 Influenza due to unidentified influenza virus with other respiratory manifestations: Secondary | ICD-10-CM

## 2012-06-03 LAB — POCT CBC
Granulocyte percent: 70.2 %G (ref 37–80)
HCT, POC: 46 % (ref 43.5–53.7)
Hemoglobin: 14.8 g/dL (ref 14.1–18.1)
Lymph, poc: 2 (ref 0.6–3.4)
MCH, POC: 28.5 pg (ref 27–31.2)
MCHC: 32.2 g/dL (ref 31.8–35.4)
MCV: 88.7 fL (ref 80–97)
MID (cbc): 1 — AB (ref 0–0.9)
MPV: 7.9 fL (ref 0–99.8)
POC Granulocyte: 6.9 (ref 2–6.9)
POC LYMPH PERCENT: 20 % (ref 10–50)
POC MID %: 9.8 %M (ref 0–12)
Platelet Count, POC: 242 10*3/uL (ref 142–424)
RBC: 5.19 M/uL (ref 4.69–6.13)
RDW, POC: 13.7 %
WBC: 9.8 10*3/uL (ref 4.6–10.2)

## 2012-06-03 LAB — POCT INFLUENZA A/B
Influenza A, POC: POSITIVE
Influenza B, POC: NEGATIVE

## 2012-06-03 MED ORDER — OSELTAMIVIR PHOSPHATE 75 MG PO CAPS: 75.0000 mg | ORAL_CAPSULE | Freq: Two times a day (BID) | ORAL | Status: DC

## 2012-06-03 MED ORDER — BENZONATATE 100 MG PO CAPS: 200.0000 mg | ORAL_CAPSULE | Freq: Two times a day (BID) | ORAL | Status: AC | PRN

## 2012-06-03 NOTE — Progress Notes (Signed)
[facility]:  Office Visit  Chief Complaint:  Chief Complaint  Patient presents with  . Cough  . Fatigue  . Nasal Congestion    HPI: Nicholas Burton is a 78 y.o. male who complains of  Dry cough, runny nose, fever, fatigue, loose stools, msk aches x 2 days. Sick contact with wife. Nonsmoker. 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 H/o bilateral knee replacement. Lives at home with wife. Flu vaccine 1 week prior. H/o melanoma. Has tried OTC meds for this without relief.  Past Medical History  Diagnosis Date  . Hypertension   . Hyperlipidemia   . Diabetes mellitus     type 2  . Osteoarthritis   . Vitamin D deficiency   . History of skin cancer    Past Surgical History  Procedure Date  . Cholecystectomy   . Total knee arthroplasty   . Inguinal hernia repair    History   Social History  . Marital Status: Married    Spouse Name: N/A    Number of Children: N/A  . Years of Education: N/A   Occupational History  . retired    Social History Main Topics  . Smoking status: Never Smoker   . Smokeless tobacco: None  . Alcohol Use: No  . Drug Use: No  . Sexually Active: Not Currently   Other Topics Concern  . None   Social History Narrative   No regular exercise   Family History  Problem Relation Age of Onset  . Diabetes Other    Allergies  Allergen Reactions  . Ace Inhibitors     REACTION: cough  . Oxycodone-Acetaminophen   . Penicillins    Prior to Admission medications   Medication Sig Start Date End Date Taking? Authorizing Provider  aspirin 81 MG tablet Take 81 mg by mouth daily.      Historical Provider, MD  atorvastatin (LIPITOR) 40 MG tablet Take 1 tablet (40 mg total) by mouth daily. 04/10/12 04/10/13  Etta Grandchild, MD  B-D ULTRAFINE III [place] PEN 31G X 8 MM MISC use as directed 08/31/11   Etta Grandchild, MD  Cholecalciferol 400 UNITS CAPS Take 1 capsule (400 Units total) by mouth once. 06/07/11   Etta Grandchild, MD  Colesevelam HCl  3.75 G PACK Take 1 each by mouth daily. 12/07/11   Etta Grandchild, MD  glucose blood (ONE TOUCH ULTRA TEST) test strip 1 each by Other route 3 (three) times daily. Use as instructed     Historical Provider, MD  glucose blood (ONE TOUCH ULTRA TEST) test strip Use as instructed 12/07/11   Etta Grandchild, MD  insulin glargine (LANTUS SOLOSTAR) 100 UNIT/ML injection Inject 50 Units into the skin daily. 04/10/12   Etta Grandchild, MD  losartan-hydrochlorothiazide (HYZAAR) 100-12.5 MG per tablet Take 1 tablet by mouth daily. 04/10/12 04/10/13  Etta Grandchild, MD  Multiple Vitamins-Minerals (PRESERVISION/LUTEIN) CAPS Take 1 capsule by mouth 2 (two) times daily.      Historical Provider, MD  OMEGA 3 1000 MG CAPS Take 1 capsule by mouth daily.      Historical Provider, MD  Saxagliptin-Metformin (KOMBIGLYZE XR) 2.12-998 MG TB24 Take 2 tablets by mouth daily. 04/10/12   Etta Grandchild, MD  Tamsulosin HCl (FLOMAX) 0.4 MG CAPS Take 0.4 mg by mouth daily.      Historical Provider, MD     ROS: The patient denies fevers, chills, night sweats, unintentional weight loss, chest pain,  palpitations, wheezing, dyspnea on exertion, nausea, vomiting, abdominal pain, dysuria, hematuria, melena, numbness, weakness, or tingling.   All other systems have been reviewed and were otherwise negative with the exception of those mentioned in the HPI and as above.    PHYSICAL EXAM: Filed Vitals:   06/03/12 1325  BP: 118/73  Pulse: 103  Temp: 100.1 F (37.8 C)  Resp: 16   Filed Vitals:   06/03/12 1325  Height: 5' 8.5" (1.74 m)  Weight: 207 lb (93.895 kg)   Body mass index is 31.02 kg/(m^2).  General: Alert, no acute distress HEENT:  Normocephalic, atraumatic, oropharynx patent.  Cardiovascular:  Regular rate and rhythm, no rubs murmurs or gallops.  No Carotid bruits, radial pulse intact. No pedal edema.  Respiratory: Clear to auscultation bilaterally.  No wheezes, rales, or rhonchi.  No cyanosis, no use of accessory  musculature GI: No organomegaly, abdomen is soft and non-tender, positive bowel sounds.  No masses. Skin: No rashes. Neurologic: Facial musculature symmetric. Psychiatric: Patient is appropriate throughout our interaction. Lymphatic: No cervical lymphadenopathy Musculoskeletal: Gait intact.   LABS: Results for orders placed in visit on 06/03/12  POCT INFLUENZA A/B      Component Value Range   Influenza A, POC Positive     Influenza B, POC Negative    POCT CBC      Component Value Range   WBC 9.8  4.6 - 10.2 K/uL   Lymph, poc 2.0  0.6 - 3.4   POC LYMPH PERCENT 20.0  10 - 50 %L   MID (cbc) 1.0 (*) 0 - 0.9   POC MID % 9.8  0 - 12 %M   POC Granulocyte 6.9  2 - 6.9   Granulocyte percent 70.2  37 - 80 %G   RBC 5.19  4.69 - 6.13 M/uL   Hemoglobin 14.8  14.1 - 18.1 g/dL   HCT, POC 40.9  81.1 - 53.7 %   MCV 88.7  80 - 97 fL   MCH, POC 28.5  27 - 31.2 pg   MCHC 32.2  31.8 - 35.4 g/dL   RDW, POC 91.4     Platelet Count, POC 242  142 - 424 K/uL   MPV 7.9  0 - 99.8 fL     EKG/XRAY:   Primary read interpreted by Dr. Conley Rolls at [facility]. ? Increase vascular markings or RLL vs hyperdensity , blunted left costrophrenic border No acute cardiopulmonary process     ASSESSMENT/PLAN: Encounter Diagnoses  Name Primary?  . Cough Yes  . SOB (shortness of breath)   . Fever   . Influenza    Patient has influenza, Rx Tamiflu Gave  rx to wife for prophylaxis Rx Tessalon Perles for cough Patient does not desire anything stronger OTCmotrin for fever control    Jenina Moening PHUONG, DO 06/03/2012 2:22 PM

## 2012-07-04 DIAGNOSIS — L570 Actinic keratosis: Secondary | ICD-10-CM | POA: Diagnosis not present

## 2012-07-04 DIAGNOSIS — L821 Other seborrheic keratosis: Secondary | ICD-10-CM | POA: Diagnosis not present

## 2012-07-04 DIAGNOSIS — Z85828 Personal history of other malignant neoplasm of skin: Secondary | ICD-10-CM | POA: Diagnosis not present

## 2012-07-04 DIAGNOSIS — Z85820 Personal history of malignant melanoma of skin: Secondary | ICD-10-CM | POA: Diagnosis not present

## 2012-07-31 ENCOUNTER — Encounter: Payer: Self-pay | Admitting: Internal Medicine

## 2012-07-31 ENCOUNTER — Other Ambulatory Visit (INDEPENDENT_AMBULATORY_CARE_PROVIDER_SITE_OTHER): Payer: Medicare Other

## 2012-07-31 ENCOUNTER — Ambulatory Visit (INDEPENDENT_AMBULATORY_CARE_PROVIDER_SITE_OTHER): Payer: Medicare Other | Admitting: Internal Medicine

## 2012-07-31 VITALS — BP 110/70 | HR 71 | Temp 98.1°F | Resp 16 | Wt 205.0 lb

## 2012-07-31 DIAGNOSIS — R609 Edema, unspecified: Secondary | ICD-10-CM

## 2012-07-31 DIAGNOSIS — I10 Essential (primary) hypertension: Secondary | ICD-10-CM | POA: Diagnosis not present

## 2012-07-31 DIAGNOSIS — E119 Type 2 diabetes mellitus without complications: Secondary | ICD-10-CM

## 2012-07-31 LAB — HM DIABETES FOOT EXAM: HM Diabetic Foot Exam: NORMAL

## 2012-07-31 LAB — BASIC METABOLIC PANEL
GFR: 82.55 mL/min (ref >60.00)
Potassium: 3.8 mEq/L (ref 3.5–5.1)
Sodium: 139 mEq/L (ref 135–145)

## 2012-07-31 LAB — HEMOGLOBIN A1C: Hgb A1c MFr Bld: 7.3 % — ABNORMAL HIGH (ref 4.6–6.5)

## 2012-07-31 MED ORDER — INSULIN GLARGINE 100 UNIT/ML ~~LOC~~ SOLN: 40.0000 [IU] | Freq: Every day | SUBCUTANEOUS | Status: DC

## 2012-07-31 NOTE — Patient Instructions (Signed)

## 2012-07-31 NOTE — Assessment & Plan Note (Signed)
This is chronic and is unchanged He has no evidence of CHF

## 2012-07-31 NOTE — Assessment & Plan Note (Signed)
He has had a few episodes of hypoglycemia (down to 44) so will decrease the lantus dose I will check his A1C and his renal function today

## 2012-07-31 NOTE — Assessment & Plan Note (Signed)
His BP is well controlled I will check his lytes and renal function 

## 2012-07-31 NOTE — Progress Notes (Signed)
Subjective:    Patient ID: Nicholas Burton, male    DOB: 08-Aug-1928, 78 y.o.   MRN: [id number]  Diabetes He presents for his follow-up diabetic visit. He has type 2 diabetes mellitus. His disease course has been improving. Hypoglycemia symptoms include hunger. Pertinent negatives for hypoglycemia include no pallor. Pertinent negatives for diabetes include no blurred vision, no chest pain, no fatigue, no foot paresthesias, no foot ulcerations, no polydipsia, no polyphagia, no polyuria, no visual change, no weakness and no weight loss. Hypoglycemia complications include required assistance. Symptoms are stable. Current diabetic treatment includes oral agent (triple therapy) and insulin injections. He is compliant with treatment all of the time. His weight is stable. He is following a generally healthy diet. Meal planning includes avoidance of concentrated sweets. He participates in exercise intermittently. His home blood glucose trend is decreasing steadily. His breakfast blood glucose range is generally 90-110 mg/dl. His lunch blood glucose range is generally 90-110 mg/dl. His dinner blood glucose range is generally 90-110 mg/dl. His highest blood glucose is 90-110 mg/dl. His overall blood glucose range is 90-110 mg/dl. An ACE inhibitor/angiotensin II receptor blocker is being taken. He does not see a podiatrist.Eye exam is current.      Review of Systems  Constitutional: Negative for fever, chills, weight loss, diaphoresis, activity change, appetite change, fatigue and unexpected weight change.  HENT: Negative.   Eyes: Negative.  Negative for blurred vision.  Respiratory: Negative for cough, chest tightness, shortness of breath, wheezing and stridor.   Cardiovascular: Negative for chest pain, palpitations and leg swelling.  Gastrointestinal: Negative for nausea, vomiting, abdominal pain, diarrhea, constipation and anal bleeding.  Genitourinary: Negative.  Negative for polyuria.  Musculoskeletal:  Negative for myalgias, back pain, joint swelling, arthralgias and gait problem.  Skin: Negative for color change, pallor, rash and wound.  Neurological: Negative.  Negative for weakness.  Hematological: Negative for polydipsia, polyphagia and adenopathy. Does not bruise/bleed easily.  Psychiatric/Behavioral: Negative.        Objective:   Physical Exam  Vitals reviewed. Constitutional: He is oriented to person, place, and time. He appears well-developed and well-nourished. No distress.  HENT:  Head: Normocephalic and atraumatic.  Mouth/Throat: Oropharynx is clear and moist. No oropharyngeal exudate.  Eyes: Conjunctivae normal are normal. Right eye exhibits no discharge. Left eye exhibits no discharge. No scleral icterus.  Neck: Normal range of motion. Neck supple. No JVD present. No tracheal deviation present. No thyromegaly present.  Cardiovascular: Normal rate, regular rhythm, normal heart sounds and intact distal pulses.  Exam reveals no gallop and no friction rub.   No murmur heard. Pulmonary/Chest: Effort normal and breath sounds normal. No stridor. No respiratory distress. He has no wheezes. He has no rales. He exhibits no tenderness.  Abdominal: Soft. Bowel sounds are normal. He exhibits no distension. There is no tenderness. There is no rebound and no guarding.  Musculoskeletal: Normal range of motion. He exhibits edema (1+ edema in BLE). He exhibits no tenderness.  Lymphadenopathy:    He has no cervical adenopathy.  Neurological: He is oriented to person, place, and time.  Skin: Skin is warm and dry. No rash noted. He is not diaphoretic. No erythema. No pallor.  Psychiatric: He has a normal mood and affect. His behavior is normal. Judgment and thought content normal.      Lab Results  Component Value Date   WBC 9.8 06/03/2012   HGB 14.8 06/03/2012   HCT 46.0 06/03/2012   PLT 223.0 09/08/2011   GLUCOSE 90  04/10/2012   CHOL 130 09/08/2011   TRIG 70.0 09/08/2011   HDL 46.30  09/08/2011   LDLCALC 70 09/08/2011   ALT 21 12/07/2011   AST 24 12/07/2011   NA 139 04/10/2012   K 4.0 04/10/2012   CL 103 04/10/2012   CREATININE 1.0 04/10/2012   BUN 20 04/10/2012   CO2 28 04/10/2012   TSH 1.84 04/05/2011   INR 1.3 ratio* 08/11/2010   HGBA1C 8.1* 04/10/2012   MICROALBUR 1.2 01/07/2010     Assessment & Plan:

## 2012-08-18 ENCOUNTER — Ambulatory Visit: Payer: Medicare Other

## 2012-08-18 ENCOUNTER — Ambulatory Visit (INDEPENDENT_AMBULATORY_CARE_PROVIDER_SITE_OTHER): Payer: Medicare Other | Admitting: Emergency Medicine

## 2012-08-18 VITALS — BP 134/76 | HR 75 | Temp 98.0°F | Resp 17 | Ht 66.5 in | Wt 205.0 lb

## 2012-08-18 DIAGNOSIS — M25559 Pain in unspecified hip: Secondary | ICD-10-CM | POA: Diagnosis not present

## 2012-08-18 MED ORDER — HYDROCODONE-ACETAMINOPHEN 5-325 MG PO TABS: 1.0000 | ORAL_TABLET | Freq: Four times a day (QID) | ORAL | Status: DC | PRN

## 2012-08-18 NOTE — Patient Instructions (Addendum)
Hip Pain  The hips join the upper legs to the lower pelvis. The bones, cartilage, tendons, and muscles of the hip joint perform a lot of work each day holding your body weight and allowing you to move around.  Hip pain is a common symptom. It can range from a minor ache to severe pain on 1 or both hips. Pain may be felt on the inside of the hip joint near the groin, or the outside near the buttocks and upper thigh. There may be swelling or stiffness as well. It occurs more often when a person walks or performs activity. There are many reasons hip pain can develop.  CAUSES   It is important to work with your caregiver to identify the cause since many conditions can impact the bones, cartilage, muscles, and tendons of the hips. Causes for hip pain include:   Broken (fractured) bones.   Separation of the thighbone from the hip socket (dislocation).   Torn cartilage of the hip joint.   Swelling (inflammation) of a tendon (tendonitis), the sac within the hip joint (bursitis), or a joint.   A weakening in the abdominal wall (hernia), affecting the nerves to the hip.   Arthritis in the hip joint or lining of the hip joint.   Pinched nerves in the back, hip, or upper thigh.   A bulging disc in the spine (herniated disc).   Rarely, bone infection or cancer.  DIAGNOSIS   The location of your hip pain will help your caregiver understand what may be causing the pain. A diagnosis is based on your medical history, your symptoms, results from your physical exam, and results from diagnostic tests. Diagnostic tests may include X-ray exams, a computerized magnetic scan (magnetic resonance imaging, MRI), or bone scan.  TREATMENT   Treatment will depend on the cause of your hip pain. Treatment may include:   Limiting activities and resting until symptoms improve.   Crutches or other walking supports (a cane or brace).   Ice, elevation, and compression.   Physical therapy or home exercises.    Shoe inserts or special shoes.   Losing weight.   Medications to reduce pain.   Undergoing surgery.  HOME CARE INSTRUCTIONS    Only take over-the-counter or prescription medicines for pain, discomfort, or fever as directed by your caregiver.   Put ice on the injured area:   Put ice in a plastic bag.   Place a towel between your skin and the bag.   Leave the ice on for 15 to 20 minutes at a time, 3 to 4 times a day.   Keep your leg raised (elevated) when possible to lessen swelling.   Avoid activities that cause pain.   Follow specific exercises as directed by your caregiver.   Sleep with a pillow between your legs on your most comfortable side.   Record how often you have hip pain, the location of the pain, and what it feels like. This information may be helpful to you and your caregiver.   Ask your caregiver about returning to work or sports and whether you should drive.   Follow up with your caregiver for further exams, therapy, or testing as directed.  SEEK MEDICAL CARE IF:    Your pain or swelling continues or worsens after 1 week.   You are feeling unwell or have chills.   You have increasing difficulty with walking.   You have a loss of sensation or other new symptoms.   You have questions   or concerns.  SEEK IMMEDIATE MEDICAL CARE IF:    You cannot put weight on the affected hip.   You have fallen.   You have a sudden increase in pain and swelling in your hip.   You have a fever.  MAKE SURE YOU:    Understand these instructions.   Will watch your condition.   Will get help right away if you are not doing well or get worse.  Document Released: 02/02/2010 Document Revised: 11/07/2011 Document Reviewed: 02/02/2010  [employer] Patient Information 2013 [employer].

## 2012-08-18 NOTE — Progress Notes (Signed)
  Subjective:    Patient ID: Nicholas Burton, male    DOB: [birth year], 78 y.o.   MRN: [id number]  HPI trouble with right hip and hurts into right knee. Knee replacement maybe 10 year ago. Has had two. First was 2003. Second knee replacement in 2004. Infection on first surgery. Working in yard this pass week. Patient was working pulling lambs cutting trees and may have injured his hip. He has taken some ibuprofen without improvement. He has had a difficult time sleeping due to the pain into his hip.    Review of Systems     Objective:   Physical Exam there is a scar present over the right knee. Patient lacks more than approximately 10 of internal rotation. He does have good external rotation. He is able to flex at the hip. There is no tenderness over the greater trochanter. There is no tenderness over the lumbar spine. Not able to elicit knee reflexes secondary to bilateral knee replacements. There were no ankle reflexes present.  [facility] reading (PRIMARY) by  Dr. Cleta Alberts there is loss of joint space no fractures        Assessment & Plan:  Films of the right hip. This showed loss of the joint space. He's make a followup appointment with Dr.Alucio. he was given some hydrocodone for pain. He had difficulty with Percocet but not with hydrocodone the

## 2012-08-27 ENCOUNTER — Other Ambulatory Visit: Payer: Self-pay | Admitting: *Deleted

## 2012-08-27 DIAGNOSIS — E119 Type 2 diabetes mellitus without complications: Secondary | ICD-10-CM

## 2012-08-27 MED ORDER — SAXAGLIPTIN-METFORMIN ER 2.5-1000 MG PO TB24: 2.0000 | ORAL_TABLET | Freq: Every day | ORAL | Status: DC

## 2012-09-06 DIAGNOSIS — M5137 Other intervertebral disc degeneration, lumbosacral region: Secondary | ICD-10-CM | POA: Diagnosis not present

## 2012-09-06 DIAGNOSIS — M48061 Spinal stenosis, lumbar region without neurogenic claudication: Secondary | ICD-10-CM | POA: Diagnosis not present

## 2012-09-06 DIAGNOSIS — M169 Osteoarthritis of hip, unspecified: Secondary | ICD-10-CM | POA: Diagnosis not present

## 2012-09-13 DIAGNOSIS — M48061 Spinal stenosis, lumbar region without neurogenic claudication: Secondary | ICD-10-CM | POA: Diagnosis not present

## 2012-09-18 ENCOUNTER — Other Ambulatory Visit: Payer: Self-pay | Admitting: Internal Medicine

## 2012-09-25 DIAGNOSIS — M5137 Other intervertebral disc degeneration, lumbosacral region: Secondary | ICD-10-CM | POA: Diagnosis not present

## 2012-10-01 DIAGNOSIS — M5137 Other intervertebral disc degeneration, lumbosacral region: Secondary | ICD-10-CM | POA: Diagnosis not present

## 2012-10-05 DIAGNOSIS — M5137 Other intervertebral disc degeneration, lumbosacral region: Secondary | ICD-10-CM | POA: Diagnosis not present

## 2012-10-09 DIAGNOSIS — M5137 Other intervertebral disc degeneration, lumbosacral region: Secondary | ICD-10-CM | POA: Diagnosis not present

## 2012-11-06 DIAGNOSIS — E119 Type 2 diabetes mellitus without complications: Secondary | ICD-10-CM | POA: Diagnosis not present

## 2012-11-06 DIAGNOSIS — H353190 Nonexudative age-related macular degeneration, unspecified eye, stage unspecified: Secondary | ICD-10-CM | POA: Diagnosis not present

## 2012-11-06 DIAGNOSIS — Z961 Presence of intraocular lens: Secondary | ICD-10-CM | POA: Diagnosis not present

## 2012-11-06 LAB — HM DIABETES EYE EXAM

## 2012-11-19 ENCOUNTER — Other Ambulatory Visit: Payer: Self-pay

## 2012-11-19 DIAGNOSIS — E119 Type 2 diabetes mellitus without complications: Secondary | ICD-10-CM

## 2012-11-19 DIAGNOSIS — E785 Hyperlipidemia, unspecified: Secondary | ICD-10-CM

## 2012-11-19 DIAGNOSIS — I10 Essential (primary) hypertension: Secondary | ICD-10-CM

## 2012-11-19 MED ORDER — COLESEVELAM HCL 3.75 G PO PACK: 1.0000 | PACK | Freq: Every day | ORAL | Status: DC

## 2012-11-19 MED ORDER — INSULIN PEN NEEDLE 31G X 8 MM MISC: Status: DC

## 2012-11-19 MED ORDER — LOSARTAN POTASSIUM-HCTZ 100-12.5 MG PO TABS: 1.0000 | ORAL_TABLET | Freq: Every day | ORAL | Status: DC

## 2012-11-19 MED ORDER — SAXAGLIPTIN-METFORMIN ER 2.5-1000 MG PO TB24: 2.0000 | ORAL_TABLET | Freq: Every day | ORAL | Status: DC

## 2012-11-19 MED ORDER — ATORVASTATIN CALCIUM 40 MG PO TABS: 40.0000 mg | ORAL_TABLET | Freq: Every day | ORAL | Status: DC

## 2012-11-19 MED ORDER — INSULIN GLARGINE 100 UNIT/ML ~~LOC~~ SOLN: 40.0000 [IU] | Freq: Every day | SUBCUTANEOUS | Status: DC

## 2012-11-19 MED ORDER — GLUCOSE BLOOD VI STRP: ORAL_STRIP | Status: DC

## 2012-11-29 ENCOUNTER — Other Ambulatory Visit (INDEPENDENT_AMBULATORY_CARE_PROVIDER_SITE_OTHER): Payer: Medicare Other

## 2012-11-29 ENCOUNTER — Ambulatory Visit (INDEPENDENT_AMBULATORY_CARE_PROVIDER_SITE_OTHER): Payer: Medicare Other | Admitting: Internal Medicine

## 2012-11-29 ENCOUNTER — Encounter: Payer: Self-pay | Admitting: Internal Medicine

## 2012-11-29 VITALS — BP 102/64 | HR 64 | Temp 97.6°F | Resp 16 | Wt 204.0 lb

## 2012-11-29 DIAGNOSIS — E785 Hyperlipidemia, unspecified: Secondary | ICD-10-CM

## 2012-11-29 DIAGNOSIS — E538 Deficiency of other specified B group vitamins: Secondary | ICD-10-CM

## 2012-11-29 DIAGNOSIS — I10 Essential (primary) hypertension: Secondary | ICD-10-CM | POA: Diagnosis not present

## 2012-11-29 LAB — COMPREHENSIVE METABOLIC PANEL
ALT: 27 U/L (ref 0–53)
Albumin: 3.7 g/dL (ref 3.5–5.2)
BUN: 17 mg/dL (ref 6–23)
CO2: 30 mEq/L (ref 19–32)
Calcium: 8.9 mg/dL (ref 8.4–10.5)
Chloride: 101 mEq/L (ref 96–112)
Creatinine, Ser: 0.9 mg/dL (ref 0.4–1.5)
GFR: 86.78 mL/min (ref >60.00)
Potassium: 4.3 mEq/L (ref 3.5–5.1)

## 2012-11-29 LAB — LIPID PANEL
HDL: 32.4 mg/dL — ABNORMAL LOW (ref >39.00)
Triglycerides: 54 mg/dL (ref 0.0–149.0)

## 2012-11-29 MED ORDER — CYANOCOBALAMIN 1000 MCG/ML IJ SOLN
1000.0000 ug | Freq: Once | INTRAMUSCULAR | Status: AC
  Administered 2013-03-04: 1000 ug via INTRAMUSCULAR

## 2012-11-29 NOTE — Progress Notes (Signed)
Subjective:    Patient ID: Nicholas Burton, male    DOB: 1928-08-08, 79 y.o.   MRN: [id number]  Diabetes He presents for his follow-up diabetic visit. He has type 2 diabetes mellitus. His disease course has been stable. There are no hypoglycemic associated symptoms. Pertinent negatives for hypoglycemia include no dizziness. There are no diabetic associated symptoms. Pertinent negatives for diabetes include no blurred vision, no chest pain, no fatigue, no foot paresthesias, no foot ulcerations, no polydipsia, no polyphagia, no polyuria, no visual change, no weakness and no weight loss. There are no hypoglycemic complications. Current diabetic treatment includes oral agent (dual therapy) and insulin injections. He is compliant with treatment all of the time. His weight is stable. He is following a generally healthy diet. Meal planning includes avoidance of concentrated sweets. He has not had a previous visit with a dietician. He participates in exercise intermittently. There is no change in his home blood glucose trend. An ACE inhibitor/angiotensin II receptor blocker is being taken. He does not see a podiatrist.Eye exam is current.      Review of Systems  Constitutional: Negative.  Negative for fever, chills, weight loss, diaphoresis, activity change, appetite change, fatigue and unexpected weight change.  HENT: Negative.   Eyes: Negative.  Negative for blurred vision.  Respiratory: Negative.  Negative for cough, chest tightness, shortness of breath, wheezing and stridor.   Cardiovascular: Negative.  Negative for chest pain, palpitations and leg swelling.  Gastrointestinal: Negative.  Negative for nausea, vomiting, abdominal pain, diarrhea and constipation.  Endocrine: Negative.  Negative for polydipsia, polyphagia and polyuria.  Genitourinary: Negative.   Musculoskeletal: Negative.  Negative for myalgias, back pain, joint swelling, arthralgias and gait problem.  Skin: Negative.    Allergic/Immunologic: Negative.   Neurological: Negative.  Negative for dizziness, weakness and light-headedness.  Hematological: Negative.  Negative for adenopathy. Does not bruise/bleed easily.  Psychiatric/Behavioral: Negative.        Objective:   Physical Exam  Vitals reviewed. Constitutional: He is oriented to person, place, and time. He appears well-developed and well-nourished. No distress.  HENT:  Head: Normocephalic and atraumatic.  Mouth/Throat: Oropharynx is clear and moist. No oropharyngeal exudate.  Eyes: Conjunctivae are normal. Right eye exhibits no discharge. Left eye exhibits no discharge. No scleral icterus.  Neck: Normal range of motion. Neck supple. No JVD present. No tracheal deviation present. No thyromegaly present.  Cardiovascular: Normal rate, regular rhythm, normal heart sounds and intact distal pulses.  Exam reveals no gallop and no friction rub.   No murmur heard. Pulmonary/Chest: Effort normal and breath sounds normal. No stridor. No respiratory distress. He has no wheezes. He has no rales. He exhibits no tenderness.  Abdominal: Soft. Bowel sounds are normal. He exhibits no distension and no mass. There is no tenderness. There is no rebound and no guarding.  Musculoskeletal: Normal range of motion. He exhibits edema (1+ pitting edema in BLE). He exhibits no tenderness.  Lymphadenopathy:    He has no cervical adenopathy.  Neurological: He is oriented to person, place, and time.  Skin: Skin is warm and dry. No rash noted. He is not diaphoretic. No erythema. No pallor.  Psychiatric: He has a normal mood and affect. His behavior is normal. Judgment and thought content normal.     Lab Results  Component Value Date   WBC 9.8 06/03/2012   HGB 14.8 06/03/2012   HCT 46.0 06/03/2012   PLT 223.0 09/08/2011   GLUCOSE 88 07/31/2012   CHOL 130 09/08/2011  TRIG 70.0 09/08/2011   HDL 46.30 09/08/2011   LDLCALC 70 09/08/2011   ALT 21 12/07/2011   AST 24 12/07/2011   NA  139 07/31/2012   K 3.8 07/31/2012   CL 102 07/31/2012   CREATININE 0.9 07/31/2012   BUN 16 07/31/2012   CO2 31 07/31/2012   TSH 1.84 04/05/2011   INR 1.3 ratio* 08/11/2010   HGBA1C 7.3* 07/31/2012   MICROALBUR 1.2 01/07/2010       Assessment & Plan:

## 2012-11-29 NOTE — Assessment & Plan Note (Signed)
FLP today 

## 2012-11-29 NOTE — Patient Instructions (Signed)

## 2012-11-29 NOTE — Assessment & Plan Note (Signed)
His BP is well controlled I will monitor his lytes and renal function today 

## 2012-11-29 NOTE — Assessment & Plan Note (Signed)
I will check his A1C and will make adjustments if needed

## 2012-12-05 ENCOUNTER — Other Ambulatory Visit: Payer: Self-pay

## 2012-12-05 DIAGNOSIS — E785 Hyperlipidemia, unspecified: Secondary | ICD-10-CM

## 2012-12-05 DIAGNOSIS — I10 Essential (primary) hypertension: Secondary | ICD-10-CM

## 2012-12-05 DIAGNOSIS — E119 Type 2 diabetes mellitus without complications: Secondary | ICD-10-CM

## 2012-12-05 MED ORDER — GLUCOSE BLOOD VI STRP: ORAL_STRIP | Status: DC

## 2012-12-05 MED ORDER — LOSARTAN POTASSIUM-HCTZ 100-12.5 MG PO TABS: 1.0000 | ORAL_TABLET | Freq: Every day | ORAL | Status: DC

## 2012-12-05 MED ORDER — ATORVASTATIN CALCIUM 40 MG PO TABS: 40.0000 mg | ORAL_TABLET | Freq: Every day | ORAL | Status: DC

## 2012-12-07 ENCOUNTER — Telehealth: Payer: Self-pay | Admitting: Internal Medicine

## 2012-12-07 DIAGNOSIS — E785 Hyperlipidemia, unspecified: Secondary | ICD-10-CM

## 2012-12-07 DIAGNOSIS — E119 Type 2 diabetes mellitus without complications: Secondary | ICD-10-CM

## 2012-12-07 DIAGNOSIS — I10 Essential (primary) hypertension: Secondary | ICD-10-CM

## 2012-12-07 NOTE — Telephone Encounter (Signed)
ok 

## 2012-12-07 NOTE — Telephone Encounter (Signed)
Caller: Maudy/; Phone: [phone]; Reason for Call: [facility] Pharmacist- Oletha Cruel calling to get confirmation on Prescriptions-  (1)  name of physician that wrote the Rx. (2)  And directions for the medication- Atrovastain 40mg  needs confirmation and Hyzaar /HCTZ.    Pharmacist states they have not received the faxes of medication and please refax to [phone].  Patient is using his [facility] for assistance with medication and is now being handled under the [employer] that has a specific fax.  CONFIRMED MEDICATIONS IN EPIC FOR THE PHARMACIST .  PLEASE ROUTE HIS MEDICATIONS TO [phone] . SPECIAL FAX.  Please change [facility] Fax for this patient.

## 2012-12-10 MED ORDER — LOSARTAN POTASSIUM-HCTZ 100-12.5 MG PO TABS: 1.0000 | ORAL_TABLET | Freq: Every day | ORAL | Status: DC

## 2012-12-10 MED ORDER — ATORVASTATIN CALCIUM 40 MG PO TABS: 40.0000 mg | ORAL_TABLET | Freq: Every day | ORAL | Status: DC

## 2012-12-10 NOTE — Addendum Note (Signed)
Addended by: Rock Nephew T on: 12/10/2012 08:58 AM   Modules accepted: Orders

## 2012-12-10 NOTE — Telephone Encounter (Signed)
RX refaxed to [phone] per [facility]

## 2012-12-18 ENCOUNTER — Ambulatory Visit (INDEPENDENT_AMBULATORY_CARE_PROVIDER_SITE_OTHER): Payer: Medicare Other | Admitting: Family Medicine

## 2012-12-18 VITALS — BP 120/64 | HR 74 | Temp 98.5°F | Resp 16 | Ht 69.0 in | Wt 200.0 lb

## 2012-12-18 DIAGNOSIS — L02419 Cutaneous abscess of limb, unspecified: Secondary | ICD-10-CM | POA: Diagnosis not present

## 2012-12-18 DIAGNOSIS — L03116 Cellulitis of left lower limb: Secondary | ICD-10-CM

## 2012-12-18 DIAGNOSIS — L03119 Cellulitis of unspecified part of limb: Secondary | ICD-10-CM

## 2012-12-18 MED ORDER — DOXYCYCLINE HYCLATE 100 MG PO CAPS: 100.0000 mg | ORAL_CAPSULE | Freq: Two times a day (BID) | ORAL | Status: DC

## 2012-12-18 NOTE — Progress Notes (Signed)
[facility] [address], [place] [address] [phone]  Date:  12/18/2012   Name:  Nicholas Burton   DOB:  1928-03-14   MRN:  [id number]  PCP:  Sanda Linger, MD    Chief Complaint: Insect Bite   History of Present Illness:  Nicholas Burton is a 79 y.o. very pleasant male patient who presents with the following:  Last weekned he was doing some work outside and got some chigger bites on his right ankle. The area was itchy, but became painful over the last day or so.  He is an IDDM, so he was especially worried about infection. His glucose this am was in the 80s and a recent A1c showed good DM control.  He othewise has not noted any flu like symtoms or fever.  He is PCN allergic.   otherwise he is feeling well, no fever, chills or flu- like symptoms.  However, his wife notes his appetite is not as good as usual.    Patient Active Problem List  Diagnosis  . Type II or unspecified type diabetes mellitus without mention of complication, uncontrolled  . VITAMIN D DEFICIENCY  . Hyperlipidemia LDL goal < 100  . HYPERTENSION  . OSTEOARTHRITIS  . Memory loss  . Edema  . B12 DEFICIENCY  . BPH (benign prostatic hyperplasia)  . Chronic neck pain    Past Medical History  Diagnosis Date  . Hypertension   . Hyperlipidemia   . Diabetes mellitus     type 2  . Osteoarthritis   . Vitamin D deficiency   . History of skin cancer   . Cataract   . Allergy   . Cancer     skin    Past Surgical History  Procedure Laterality Date  . Cholecystectomy    . Total knee arthroplasty    . Inguinal hernia repair    . Joint replacement      History  Substance Use Topics  . Smoking status: Never Smoker   . Smokeless tobacco: Never Used  . Alcohol Use: No    Family History  Problem Relation Age of Onset  . Diabetes Other   . Heart disease Mother   . Diabetes Mother   . Heart disease Father   . Cancer Sister   . Cancer Brother   . Diabetes Son     Allergies   Allergen Reactions  . Ace Inhibitors     REACTION: cough  . Oxycodone-Acetaminophen   . Penicillins     Medication list has been reviewed and updated.  Current Outpatient Prescriptions on File Prior to Visit  Medication Sig Dispense Refill  . aspirin 81 MG tablet Take 81 mg by mouth daily.        Marland Kitchen atorvastatin (LIPITOR) 40 MG tablet Take 1 tablet (40 mg total) by mouth daily.  90 tablet  3  . Cholecalciferol 400 UNITS CAPS Take 1 capsule (400 Units total) by mouth once.  30 each  11  . Colesevelam HCl 3.75 G PACK Take 1 packet by mouth daily.  90 each  3  . glucose blood (ONE TOUCH ULTRA TEST) test strip 1 each by Other route 3 (three) times daily. Use as instructed  100 each  4  . insulin glargine (LANTUS SOLOSTAR) 100 UNIT/ML injection Inject 0.4 mLs (40 Units total) into the skin daily.  10 mL  4  . Insulin Pen Needle (B-D ULTRAFINE III Lennox PEN) 31G X 8 MM MISC use  as directed TID  100 each  5  . losartan-hydrochlorothiazide (HYZAAR) 100-12.5 MG per tablet Take 1 tablet by mouth daily.  90 tablet  4  . Multiple Vitamins-Minerals (PRESERVISION/LUTEIN) CAPS Take 1 capsule by mouth 2 (two) times daily.        . OMEGA 3 1000 MG CAPS Take 1 capsule by mouth daily.        . Saxagliptin-Metformin (KOMBIGLYZE XR) 2.12-998 MG TB24 Take 2 tablets by mouth daily.  180 tablet  4   Current Facility-Administered Medications on File Prior to Visit  Medication Dose Route Frequency Provider Last Rate Last Dose  . cyanocobalamin ((VITAMIN B-12)) injection 1,000 mcg  1,000 mcg Intramuscular Once Etta Grandchild, MD        Review of Systems:  As per HPI- otherwise negative.   Physical Examination: Filed Vitals:   12/18/12 1336  BP: 120/64  Pulse: 74  Temp: 98.5 F (36.9 C)  Resp: 16   Filed Vitals:   12/18/12 1336  Height: 5\' 9"  (1.753 m)  Weight: 200 lb (90.719 kg)   Body mass index is 29.52 kg/(m^2). Ideal Body Weight: Weight in (lb) to have BMI = 25: 168.9  GEN: WDWN, NAD,  Non-toxic, A & O x 3, overweight HEENT: Atraumatic, Normocephalic. Neck supple. No masses, No LAD. Ears and Nose: No external deformity. CV: RRR, No M/G/R. No JVD. No thrill. No extra heart sounds. PULM: CTA B, no wheezes, crackles, rhonchi. No retractions. No resp. distress. No accessory muscle use. ABD: S, NT, ND EXTR: No c/c NEURO Normal gait.  PSYCH: Normally interactive. Conversant. Not depressed or anxious appearing.  Calm demeanor. Right leg: the calves measure equally and he has no calf tenderness or cords.  Around the right ankle is a mild red rash and slight warmth.  He appears to have mild edema stemming from some insect bites which is circumferential around the ankle.  Swelling does not extend into the foot, and his foot shows normal cap refill, no redness or warmth in the foot  Assessment and Plan: Cellulitis of leg, left - Plan: doxycycline (VIBRAMYCIN) 100 MG capsule  Mild cellulitis of the right ankle, likely stemming from some insect bites.  Will treat with doxycycline, and plan for close follow- up  We are going to use doxycycline (antibiotic) for your leg.  Please elevate the leg when you can.  If you are getting worse or develop any other symptoms such as fever or chills please seek care right away. Otherwise, please recheck with [place] or Dr. Yetta Barre in 2 days.   Signed Abbe Amsterdam, MD

## 2012-12-18 NOTE — Patient Instructions (Addendum)
We are going to use doxycycline (antibiotic) for your leg.  Please elevate the leg when you can.  If you are getting worse or develop any other symptoms such as fever or chills please seek care right away. Otherwise, please recheck with [place] or Dr. Yetta Barre in 2 days.

## 2012-12-20 ENCOUNTER — Ambulatory Visit (INDEPENDENT_AMBULATORY_CARE_PROVIDER_SITE_OTHER): Payer: Medicare Other | Admitting: Internal Medicine

## 2012-12-20 ENCOUNTER — Encounter: Payer: Self-pay | Admitting: Internal Medicine

## 2012-12-20 VITALS — BP 124/82 | HR 84 | Temp 98.1°F | Resp 16 | Wt 201.2 lb

## 2012-12-20 DIAGNOSIS — L03119 Cellulitis of unspecified part of limb: Secondary | ICD-10-CM | POA: Diagnosis not present

## 2012-12-20 DIAGNOSIS — L03115 Cellulitis of right lower limb: Secondary | ICD-10-CM

## 2012-12-20 MED ORDER — GLUCOSE BLOOD VI STRP: ORAL_STRIP | Status: DC

## 2012-12-20 NOTE — Progress Notes (Signed)
  Subjective:    Patient ID: Nicholas Burton, male    DOB: Dec 27, 1927, 79 y.o.   MRN: [id number]  Wound Check He was originally treated 3 to 5 days ago. Previous treatment included oral antibiotics. His temperature was unmeasured prior to arrival. There has been no drainage from the wound. The redness has improved. The swelling has improved. The pain has no pain. He has no difficulty moving the affected extremity or digit.      Review of Systems  Constitutional: Negative.  Negative for fever, chills and fatigue.  HENT: Negative.   Eyes: Negative.   Respiratory: Negative.   Cardiovascular: Negative.  Negative for chest pain, palpitations and leg swelling.  Gastrointestinal: Negative.   Endocrine: Negative.   Genitourinary: Negative.   Musculoskeletal: Negative.  Negative for myalgias, back pain, joint swelling, arthralgias and gait problem.  Skin: Positive for wound (he thinks that a bug it him on his leg). Negative for color change, pallor and rash.  Allergic/Immunologic: Negative.   Neurological: Negative.   Hematological: Negative.  Negative for adenopathy. Does not bruise/bleed easily.  Psychiatric/Behavioral: Negative.        Objective:   Physical Exam  Vitals reviewed. Constitutional: He is oriented to person, place, and time. He appears well-developed and well-nourished.  Non-toxic appearance. He does not have a sickly appearance. He does not appear ill. No distress.  HENT:  Head: Normocephalic and atraumatic.  Mouth/Throat: Oropharynx is clear and moist. No oropharyngeal exudate.  Eyes: Conjunctivae are normal. Right eye exhibits no discharge. Left eye exhibits no discharge. No scleral icterus.  Neck: Normal range of motion. Neck supple. No JVD present. No tracheal deviation present. No thyromegaly present.  Cardiovascular: Normal rate, regular rhythm, normal heart sounds and intact distal pulses.  Exam reveals no gallop and no friction rub.   No murmur  heard. Pulmonary/Chest: Effort normal and breath sounds normal. No stridor. No respiratory distress. He has no wheezes. He has no rales. He exhibits no tenderness.  Abdominal: Soft. Bowel sounds are normal. He exhibits no distension and no mass. There is no tenderness. There is no rebound and no guarding.  Musculoskeletal: Normal range of motion. He exhibits no edema and no tenderness.       Right lower leg: He exhibits swelling and deformity. He exhibits no tenderness, no bony tenderness, no edema and no laceration.       Legs: Lymphadenopathy:    He has no cervical adenopathy.  Neurological: He is oriented to person, place, and time.  Skin: Skin is warm and dry. No rash noted. He is not diaphoretic. There is erythema. No pallor.  Psychiatric: He has a normal mood and affect. His behavior is normal. Judgment and thought content normal.     Lab Results  Component Value Date   WBC 9.8 06/03/2012   HGB 14.8 06/03/2012   HCT 46.0 06/03/2012   PLT 223.0 09/08/2011   GLUCOSE 99 11/29/2012   CHOL 66 11/29/2012   TRIG 54.0 11/29/2012   HDL 32.40* 11/29/2012   LDLCALC 23 11/29/2012   ALT 27 11/29/2012   AST 28 11/29/2012   NA 137 11/29/2012   K 4.3 11/29/2012   CL 101 11/29/2012   CREATININE 0.9 11/29/2012   BUN 17 11/29/2012   CO2 30 11/29/2012   TSH 1.84 04/05/2011   INR 1.3 ratio* 08/11/2010   HGBA1C 7.2* 11/29/2012   MICROALBUR 1.2 01/07/2010       Assessment & Plan:

## 2012-12-20 NOTE — Patient Instructions (Signed)
Cellulitis Cellulitis is an infection of the skin and the tissue beneath it. The infected area is usually red and tender. Cellulitis occurs most often in the arms and lower legs.   CAUSES   Cellulitis is caused by bacteria that enter the skin through cracks or cuts in the skin. The most common types of bacteria that cause cellulitis are Staphylococcus and Streptococcus. SYMPTOMS    Redness and warmth.   Swelling.   Tenderness or pain.   Fever.  DIAGNOSIS  Your caregiver can usually determine what is wrong based on a physical exam. Blood tests may also be done. TREATMENT   Treatment usually involves taking an antibiotic medicine. HOME CARE INSTRUCTIONS    Take your antibiotics as directed. Finish them even if you start to feel better.   Keep the infected arm or leg elevated to reduce swelling.   Apply a warm cloth to the affected area up to 4 times per day to relieve pain.   Only take over-the-counter or prescription medicines for pain, discomfort, or fever as directed by your caregiver.   Keep all follow-up appointments as directed by your caregiver.  SEEK MEDICAL CARE IF:    You notice red streaks coming from the infected area.   Your red area gets larger or turns dark in color.   Your bone or joint underneath the infected area becomes painful after the skin has healed.   Your infection returns in the same area or another area.   You notice a swollen bump in the infected area.   You develop new symptoms.  SEEK IMMEDIATE MEDICAL CARE IF:    You have a fever.   You feel very sleepy.   You develop vomiting or diarrhea.   You have a general ill feeling (malaise) with muscle aches and pains.  MAKE SURE YOU:    Understand these instructions.   Will watch your condition.   Will get help right away if you are not doing well or get worse.  Document Released: 05/25/2005 Document Revised: 02/14/2012 Document Reviewed: 10/31/2011 [employer] Patient Information 2013  [employer].    

## 2012-12-23 ENCOUNTER — Encounter: Payer: Self-pay | Admitting: Internal Medicine

## 2012-12-23 DIAGNOSIS — L03115 Cellulitis of right lower limb: Secondary | ICD-10-CM | POA: Insufficient documentation

## 2012-12-23 NOTE — Assessment & Plan Note (Signed)
Improvement noted He will continue doxycycline

## 2012-12-27 ENCOUNTER — Ambulatory Visit (INDEPENDENT_AMBULATORY_CARE_PROVIDER_SITE_OTHER): Payer: Medicare Other | Admitting: Internal Medicine

## 2012-12-27 ENCOUNTER — Encounter: Payer: Self-pay | Admitting: Internal Medicine

## 2012-12-27 VITALS — BP 140/82 | HR 82 | Temp 98.3°F | Resp 16 | Wt 203.0 lb

## 2012-12-27 DIAGNOSIS — I10 Essential (primary) hypertension: Secondary | ICD-10-CM | POA: Diagnosis not present

## 2012-12-27 DIAGNOSIS — L602 Onychogryphosis: Secondary | ICD-10-CM

## 2012-12-27 DIAGNOSIS — L02419 Cutaneous abscess of limb, unspecified: Secondary | ICD-10-CM | POA: Diagnosis not present

## 2012-12-27 DIAGNOSIS — L608 Other nail disorders: Secondary | ICD-10-CM

## 2012-12-27 DIAGNOSIS — L03119 Cellulitis of unspecified part of limb: Secondary | ICD-10-CM | POA: Diagnosis not present

## 2012-12-27 DIAGNOSIS — L03115 Cellulitis of right lower limb: Secondary | ICD-10-CM

## 2012-12-27 NOTE — Patient Instructions (Signed)

## 2012-12-28 NOTE — Progress Notes (Signed)
  Subjective:    Patient ID: Nicholas Burton, male    DOB: [birth year], 79 y.o.   MRN: [id number]  HPI  He returns for f/up and he tells me that his legs feel much better.  Review of Systems  Constitutional: Negative.  Negative for fever, chills, diaphoresis, activity change, appetite change, fatigue and unexpected weight change.  HENT: Negative.   Eyes: Negative.   Respiratory: Negative.  Negative for cough, chest tightness, shortness of breath, wheezing and stridor.   Cardiovascular: Negative.  Negative for chest pain, palpitations and leg swelling.  Gastrointestinal: Negative.   Endocrine: Negative.   Genitourinary: Negative.   Musculoskeletal: Negative.   Skin: Negative.   Allergic/Immunologic: Negative.   Neurological: Negative.  Negative for dizziness.  Hematological: Negative.   Psychiatric/Behavioral: Negative.        Objective:   Physical Exam  Vitals reviewed. Constitutional: He is oriented to person, place, and time. He appears well-developed and well-nourished. No distress.  HENT:  Head: Normocephalic and atraumatic.  Mouth/Throat: Oropharynx is clear and moist. No oropharyngeal exudate.  Eyes: Conjunctivae are normal. Right eye exhibits no discharge. Left eye exhibits no discharge. No scleral icterus.  Neck: Normal range of motion. Neck supple. No JVD present. No tracheal deviation present. No thyromegaly present.  Cardiovascular: Normal rate, regular rhythm, normal heart sounds and intact distal pulses.  Exam reveals no gallop and no friction rub.   No murmur heard. Pulmonary/Chest: Effort normal and breath sounds normal. No stridor. No respiratory distress. He has no wheezes. He has no rales. He exhibits no tenderness.  Abdominal: Soft. Bowel sounds are normal. He exhibits no distension and no mass. There is no tenderness. There is no rebound and no guarding.  Musculoskeletal: Normal range of motion. He exhibits edema (1+ pitting edema in BLE). He exhibits no  tenderness.  Lymphadenopathy:    He has no cervical adenopathy.  Neurological: He is oriented to person, place, and time.  Skin: Skin is warm, dry and intact. No abrasion, no bruising, no burn, no ecchymosis, no laceration, no lesion, no petechiae, no purpura and no rash noted. Rash is not macular, not papular, not maculopapular, not nodular, not pustular, not vesicular and not urticarial. He is not diaphoretic. No cyanosis or erythema. No pallor. Nails show no clubbing.  Psychiatric: He has a normal mood and affect. His behavior is normal. Judgment and thought content normal.     Lab Results  Component Value Date   WBC 9.8 06/03/2012   HGB 14.8 06/03/2012   HCT 46.0 06/03/2012   PLT 223.0 09/08/2011   GLUCOSE 99 11/29/2012   CHOL 66 11/29/2012   TRIG 54.0 11/29/2012   HDL 32.40* 11/29/2012   LDLCALC 23 11/29/2012   ALT 27 11/29/2012   AST 28 11/29/2012   NA 137 11/29/2012   K 4.3 11/29/2012   CL 101 11/29/2012   CREATININE 0.9 11/29/2012   BUN 17 11/29/2012   CO2 30 11/29/2012   TSH 1.84 04/05/2011   INR 1.3 ratio* 08/11/2010   HGBA1C 7.2* 11/29/2012   MICROALBUR 1.2 01/07/2010       Assessment & Plan:

## 2012-12-28 NOTE — Assessment & Plan Note (Signed)
Podiatry referral

## 2012-12-28 NOTE — Assessment & Plan Note (Signed)
This has resolved.

## 2012-12-28 NOTE — Assessment & Plan Note (Signed)
His BP is well controlled 

## 2013-01-29 ENCOUNTER — Telehealth: Payer: Self-pay

## 2013-01-29 NOTE — Telephone Encounter (Signed)
Pt called [facility] request appt for b-12 injection. Returned call to pt and appt set up for 01/30/13

## 2013-01-30 ENCOUNTER — Ambulatory Visit (INDEPENDENT_AMBULATORY_CARE_PROVIDER_SITE_OTHER): Payer: Medicare Other

## 2013-01-30 DIAGNOSIS — E538 Deficiency of other specified B group vitamins: Secondary | ICD-10-CM

## 2013-01-30 MED ORDER — CYANOCOBALAMIN 1000 MCG/ML IJ SOLN
1000.0000 ug | Freq: Once | INTRAMUSCULAR | Status: AC
  Administered 2013-01-30: 1000 ug via INTRAMUSCULAR

## 2013-03-04 ENCOUNTER — Ambulatory Visit (INDEPENDENT_AMBULATORY_CARE_PROVIDER_SITE_OTHER): Payer: Medicare Other

## 2013-03-04 DIAGNOSIS — E538 Deficiency of other specified B group vitamins: Secondary | ICD-10-CM

## 2013-03-18 DIAGNOSIS — Z85820 Personal history of malignant melanoma of skin: Secondary | ICD-10-CM | POA: Diagnosis not present

## 2013-04-01 ENCOUNTER — Ambulatory Visit (INDEPENDENT_AMBULATORY_CARE_PROVIDER_SITE_OTHER): Payer: Medicare Other | Admitting: Internal Medicine

## 2013-04-01 ENCOUNTER — Encounter: Payer: Self-pay | Admitting: Internal Medicine

## 2013-04-01 ENCOUNTER — Other Ambulatory Visit (INDEPENDENT_AMBULATORY_CARE_PROVIDER_SITE_OTHER): Payer: Medicare Other

## 2013-04-01 VITALS — BP 120/70 | HR 73 | Temp 97.1°F | Resp 16 | Wt 204.0 lb

## 2013-04-01 DIAGNOSIS — I10 Essential (primary) hypertension: Secondary | ICD-10-CM

## 2013-04-01 DIAGNOSIS — E538 Deficiency of other specified B group vitamins: Secondary | ICD-10-CM

## 2013-04-01 DIAGNOSIS — E669 Obesity, unspecified: Secondary | ICD-10-CM

## 2013-04-01 DIAGNOSIS — IMO0001 Reserved for inherently not codable concepts without codable children: Secondary | ICD-10-CM | POA: Diagnosis not present

## 2013-04-01 DIAGNOSIS — Z23 Encounter for immunization: Secondary | ICD-10-CM | POA: Diagnosis not present

## 2013-04-01 DIAGNOSIS — E785 Hyperlipidemia, unspecified: Secondary | ICD-10-CM

## 2013-04-01 HISTORY — DX: Obesity, unspecified: E66.9

## 2013-04-01 LAB — BASIC METABOLIC PANEL
Chloride: 105 mEq/L (ref 96–112)
Creatinine, Ser: 0.9 mg/dL (ref 0.4–1.5)
GFR: 83.45 mL/min (ref >60.00)

## 2013-04-01 LAB — HEMOGLOBIN A1C: Hgb A1c MFr Bld: 7.5 % — ABNORMAL HIGH (ref 4.6–6.5)

## 2013-04-01 MED ORDER — CYANOCOBALAMIN 1000 MCG/ML IJ SOLN
1000.0000 ug | Freq: Once | INTRAMUSCULAR | Status: AC
  Administered 2013-04-01: 1000 ug via INTRAMUSCULAR

## 2013-04-01 NOTE — Patient Instructions (Signed)
Type 2 Diabetes Mellitus, Adult Type 2 diabetes mellitus, often simply referred to as type 2 diabetes, is a long-lasting (chronic) disease. In type 2 diabetes, the pancreas does not make enough insulin (a hormone), the cells are less responsive to the insulin that is made (insulin resistance), or both. Normally, insulin moves sugars from food into the tissue cells. The tissue cells use the sugars for energy. The lack of insulin or the lack of normal response to insulin causes excess sugars to build up in the blood instead of going into the tissue cells. As a result, high blood sugar (hyperglycemia) develops. The effect of high sugar (glucose) levels can cause many complications. Type 2 diabetes was also previously called adult-onset diabetes but it can occur at any age.  RISK FACTORS  A person is predisposed to developing type 2 diabetes if someone in the family has the disease and also has one or more of the following primary risk factors:  Overweight.  An inactive lifestyle.  A history of consistently eating high-calorie foods. Maintaining a normal weight and regular physical activity can reduce the chance of developing type 2 diabetes. SYMPTOMS  A person with type 2 diabetes may not show symptoms initially. The symptoms of type 2 diabetes appear slowly. The symptoms include:  Increased thirst (polydipsia).  Increased urination (polyuria).  Increased urination during the night (nocturia).  Weight loss. This weight loss may be rapid.  Frequent, recurring infections.  Tiredness (fatigue).  Weakness.  Vision changes, such as blurred vision.  Fruity smell to your breath.  Abdominal pain.  Nausea or vomiting.  Cuts or bruises which are slow to heal.  Tingling or numbness in the hands or feet. DIAGNOSIS Type 2 diabetes is frequently not diagnosed until complications of diabetes are present. Type 2 diabetes is diagnosed when symptoms or complications are present and when blood  glucose levels are increased. Your blood glucose level may be checked by one or more of the following blood tests:  A fasting blood glucose test. You will not be allowed to eat for at least 8 hours before a blood sample is taken.  A random blood glucose test. Your blood glucose is checked at any time of the day regardless of when you ate.  A hemoglobin A1c blood glucose test. A hemoglobin A1c test provides information about blood glucose control over the previous 3 months.  An oral glucose tolerance test (OGTT). Your blood glucose is measured after you have not eaten (fasted) for 2 hours and then after you drink a glucose-containing beverage. TREATMENT   You may need to take insulin or diabetes medicine daily to keep blood glucose levels in the desired range.  You will need to match insulin dosing with exercise and healthy food choices. The treatment goal is to maintain the before meal blood sugar (preprandial glucose) level at 70 130 mg/dL. HOME CARE INSTRUCTIONS   Have your hemoglobin A1c level checked twice a year.  Perform daily blood glucose monitoring as directed by your caregiver.  Monitor urine ketones when you are ill and as directed by your caregiver.  Take your diabetes medicine or insulin as directed by your caregiver to maintain your blood glucose levels in the desired range.  Never run out of diabetes medicine or insulin. It is needed every day.  Adjust insulin based on your intake of carbohydrates. Carbohydrates can raise blood glucose levels but need to be included in your diet. Carbohydrates provide vitamins, minerals, and fiber which are an essential part of   a healthy diet. Carbohydrates are found in fruits, vegetables, whole grains, dairy products, legumes, and foods containing added sugars.    Eat healthy foods. Alternate 3 meals with 3 snacks.  Lose weight if overweight.  Carry a medical alert card or wear your medical alert jewelry.  Carry a 15 gram  carbohydrate snack with you at all times to treat low blood glucose (hypoglycemia). Some examples of 15 gram carbohydrate snacks include:  Glucose tablets, 3 or 4   Glucose gel, 15 gram tube  Raisins, 2 tablespoons (24 grams)  Jelly beans, 6  Animal crackers, 8  Regular pop, 4 ounces (120 mL)  Gummy treats, 9  Recognize hypoglycemia. Hypoglycemia occurs with blood glucose levels of 70 mg/dL and below. The risk for hypoglycemia increases when fasting or skipping meals, during or after intense exercise, and during sleep. Hypoglycemia symptoms can include:  Tremors or shakes.  Decreased ability to concentrate.  Sweating.  Increased heart rate.  Headache.  Dry mouth.  Hunger.  Irritability.  Anxiety.  Restless sleep.  Altered speech or coordination.  Confusion.  Treat hypoglycemia promptly. If you are alert and able to safely swallow, follow the 15:15 rule:  Take 15 20 grams of rapid-acting glucose or carbohydrate. Rapid-acting options include glucose gel, glucose tablets, or 4 ounces (120 mL) of fruit juice, regular soda, or low fat milk.  Check your blood glucose level 15 minutes after taking the glucose.  Take 15 20 grams more of glucose if the repeat blood glucose level is still 70 mg/dL or below.  Eat a meal or snack within 1 hour once blood glucose levels return to normal.    Be alert to polyuria and polydipsia which are early signs of hyperglycemia. An early awareness of hyperglycemia allows for prompt treatment. Treat hyperglycemia as directed by your caregiver.  Engage in at least 150 minutes of moderate-intensity physical activity a week, spread over at least 3 days of the week or as directed by your caregiver. In addition, you should engage in resistance exercise at least 2 times a week or as directed by your caregiver.  Adjust your medicine and food intake as needed if you start a new exercise or sport.  Follow your sick day plan at any time you  are unable to eat or drink as usual.  Avoid tobacco use.  Limit alcohol intake to no more than 1 drink per day for nonpregnant women and 2 drinks per day for men. You should drink alcohol only when you are also eating food. Talk with your caregiver whether alcohol is safe for you. Tell your caregiver if you drink alcohol several times a week.  Follow up with your caregiver regularly.  Schedule an eye exam soon after the diagnosis of type 2 diabetes and then annually.  Perform daily skin and foot care. Examine your skin and feet daily for cuts, bruises, redness, nail problems, bleeding, blisters, or sores. A foot exam by a caregiver should be done annually.  Brush your teeth and gums at least twice a day and floss at least once a day. Follow up with your dentist regularly.  Share your diabetes management plan with your workplace or school.  Stay up-to-date with immunizations.  Learn to manage stress.  Obtain ongoing diabetes education and support as needed.  Participate in, or seek rehabilitation as needed to maintain or improve independence and quality of life. Request a physical or occupational therapy referral if you are having foot or hand numbness or difficulties with grooming,   dressing, eating, or physical activity. SEEK MEDICAL CARE IF:   You are unable to eat food or drink fluids for more than 6 hours.  You have nausea and vomiting for more than 6 hours.  Your blood glucose level is over 240 mg/dL.  There is a change in mental status.  You develop an additional serious illness.  You have diarrhea for more than 6 hours.  You have been sick or have had a fever for a couple of days and are not getting better.  You have pain during any physical activity.  SEEK IMMEDIATE MEDICAL CARE IF:  You have difficulty breathing.  You have moderate to large ketone levels. MAKE SURE YOU:  Understand these instructions.  Will watch your condition.  Will get help right away if  you are not doing well or get worse. Document Released: 08/15/2005 Document Revised: 05/09/2012 Document Reviewed: 03/13/2012 [employer] Patient Information 2014 [employer].  

## 2013-04-01 NOTE — Progress Notes (Signed)
Subjective:    Patient ID: Nicholas Burton, male    DOB: 17-May-1928, 79 y.o.   MRN: [id number]  Diabetes He presents for his follow-up diabetic visit. He has type 2 diabetes mellitus. His disease course has been stable. There are no hypoglycemic associated symptoms. Pertinent negatives for hypoglycemia include no dizziness or tremors. Pertinent negatives for diabetes include no blurred vision, no chest pain, no fatigue, no foot paresthesias, no foot ulcerations, no polydipsia, no polyphagia, no polyuria, no visual change, no weakness and no weight loss. There are no hypoglycemic complications. Symptoms are stable. There are no diabetic complications. Current diabetic treatment includes oral agent (triple therapy). He is compliant with treatment all of the time. His weight is stable. He is following a generally healthy diet. Meal planning includes avoidance of concentrated sweets. He has not had a previous visit with a dietician. He participates in exercise intermittently. There is no change in his home blood glucose trend. An ACE inhibitor/angiotensin II receptor blocker is being taken. He does not see a podiatrist.Eye exam is current.      Review of Systems  Constitutional: Negative.  Negative for fever, chills, weight loss, diaphoresis, activity change, appetite change, fatigue and unexpected weight change.  HENT: Negative.   Eyes: Negative.  Negative for blurred vision.  Respiratory: Negative.  Negative for cough, chest tightness, shortness of breath, wheezing and stridor.   Cardiovascular: Negative.  Negative for chest pain, palpitations and leg swelling.  Gastrointestinal: Negative.  Negative for nausea, vomiting, abdominal pain, diarrhea, constipation and blood in stool.  Endocrine: Negative for polydipsia, polyphagia and polyuria.  Genitourinary: Negative.  Negative for urgency, flank pain, decreased urine volume and difficulty urinating.  Musculoskeletal: Negative for myalgias, back pain,  joint swelling, arthralgias and gait problem.  Skin: Negative.   Allergic/Immunologic: Negative.   Neurological: Negative.  Negative for dizziness, tremors, weakness, light-headedness and numbness.  Hematological: Negative.  Negative for adenopathy. Does not bruise/bleed easily.  Psychiatric/Behavioral: Negative.        Objective:   Physical Exam  Vitals reviewed. Constitutional: He is oriented to person, place, and time. He appears well-developed and well-nourished. No distress.  HENT:  Head: Normocephalic and atraumatic.  Mouth/Throat: Oropharynx is clear and moist. No oropharyngeal exudate.  Eyes: Conjunctivae are normal. Right eye exhibits no discharge. Left eye exhibits no discharge. No scleral icterus.  Neck: Normal range of motion. Neck supple. No JVD present. No tracheal deviation present. No thyromegaly present.  Cardiovascular: Normal rate, regular rhythm, normal heart sounds and intact distal pulses.  Exam reveals no gallop and no friction rub.   No murmur heard. Pulmonary/Chest: Effort normal and breath sounds normal. No stridor. No respiratory distress. He has no wheezes. He has no rales. He exhibits no tenderness.  Abdominal: Soft. Bowel sounds are normal. He exhibits no distension and no mass. There is no tenderness. There is no rebound and no guarding.  Musculoskeletal: Normal range of motion. He exhibits edema (1+ pitting edema in BLE). He exhibits no tenderness.  Lymphadenopathy:    He has no cervical adenopathy.  Neurological: He is oriented to person, place, and time.  Skin: Skin is warm and dry. No rash noted. He is not diaphoretic. No erythema. No pallor.  Psychiatric: He has a normal mood and affect. His behavior is normal. Judgment and thought content normal.     Lab Results  Component Value Date   WBC 9.8 06/03/2012   HGB 14.8 06/03/2012   HCT 46.0 06/03/2012   PLT 223.0  09/08/2011   GLUCOSE 99 11/29/2012   CHOL 66 11/29/2012   TRIG 54.0 11/29/2012   HDL 32.40*  11/29/2012   LDLCALC 23 11/29/2012   ALT 27 11/29/2012   AST 28 11/29/2012   NA 137 11/29/2012   K 4.3 11/29/2012   CL 101 11/29/2012   CREATININE 0.9 11/29/2012   BUN 17 11/29/2012   CO2 30 11/29/2012   TSH 1.84 04/05/2011   INR 1.3 ratio* 08/11/2010   HGBA1C 7.2* 11/29/2012   MICROALBUR 1.2 01/07/2010       Assessment & Plan:

## 2013-04-01 NOTE — Assessment & Plan Note (Signed)
Goal achieved 

## 2013-04-01 NOTE — Assessment & Plan Note (Signed)
He will work on his lifestyle modifications to lose weight 

## 2013-04-01 NOTE — Assessment & Plan Note (Signed)
His A1C is up a little He will work on his lifestyle modifications

## 2013-04-01 NOTE — Assessment & Plan Note (Signed)
His BP is well controlled His lytes and renal function are stable 

## 2013-05-02 ENCOUNTER — Ambulatory Visit (INDEPENDENT_AMBULATORY_CARE_PROVIDER_SITE_OTHER): Payer: Medicare Other

## 2013-05-02 DIAGNOSIS — E538 Deficiency of other specified B group vitamins: Secondary | ICD-10-CM | POA: Diagnosis not present

## 2013-05-02 MED ORDER — CYANOCOBALAMIN 1000 MCG/ML IJ SOLN
1000.0000 ug | Freq: Once | INTRAMUSCULAR | Status: AC
  Administered 2013-05-02: 1000 ug via INTRAMUSCULAR

## 2013-06-03 ENCOUNTER — Ambulatory Visit (INDEPENDENT_AMBULATORY_CARE_PROVIDER_SITE_OTHER): Payer: Medicare Other

## 2013-06-03 DIAGNOSIS — Z23 Encounter for immunization: Secondary | ICD-10-CM | POA: Diagnosis not present

## 2013-06-03 DIAGNOSIS — E538 Deficiency of other specified B group vitamins: Secondary | ICD-10-CM

## 2013-06-03 MED ORDER — CYANOCOBALAMIN 1000 MCG/ML IJ SOLN
1000.0000 ug | Freq: Once | INTRAMUSCULAR | Status: AC
  Administered 2013-06-03: 1000 ug via INTRAMUSCULAR

## 2013-07-04 ENCOUNTER — Ambulatory Visit (INDEPENDENT_AMBULATORY_CARE_PROVIDER_SITE_OTHER): Payer: Medicare Other | Admitting: *Deleted

## 2013-07-04 DIAGNOSIS — E538 Deficiency of other specified B group vitamins: Secondary | ICD-10-CM

## 2013-07-04 MED ORDER — CYANOCOBALAMIN 1000 MCG/ML IJ SOLN
1000.0000 ug | Freq: Once | INTRAMUSCULAR | Status: AC
  Administered 2013-07-04: 1000 ug via INTRAMUSCULAR

## 2013-08-01 ENCOUNTER — Other Ambulatory Visit (INDEPENDENT_AMBULATORY_CARE_PROVIDER_SITE_OTHER): Payer: Medicare Other

## 2013-08-01 ENCOUNTER — Encounter: Payer: Self-pay | Admitting: Internal Medicine

## 2013-08-01 ENCOUNTER — Ambulatory Visit (INDEPENDENT_AMBULATORY_CARE_PROVIDER_SITE_OTHER): Payer: Medicare Other | Admitting: Internal Medicine

## 2013-08-01 VITALS — BP 128/70 | HR 73 | Temp 97.8°F | Resp 16 | Ht 69.0 in | Wt 203.0 lb

## 2013-08-01 DIAGNOSIS — I10 Essential (primary) hypertension: Secondary | ICD-10-CM | POA: Diagnosis not present

## 2013-08-01 DIAGNOSIS — E538 Deficiency of other specified B group vitamins: Secondary | ICD-10-CM | POA: Diagnosis not present

## 2013-08-01 LAB — BASIC METABOLIC PANEL
CO2: 30 mEq/L (ref 19–32)
Chloride: 103 mEq/L (ref 96–112)
Creatinine, Ser: 1 mg/dL (ref 0.4–1.5)
GFR: 77.52 mL/min (ref >60.00)
Potassium: 4 mEq/L (ref 3.5–5.1)
Sodium: 140 mEq/L (ref 135–145)

## 2013-08-01 LAB — HEMOGLOBIN A1C: Hgb A1c MFr Bld: 7.3 % — ABNORMAL HIGH (ref 4.6–6.5)

## 2013-08-01 MED ORDER — CYANOCOBALAMIN 1000 MCG/ML IJ SOLN
1000.0000 ug | Freq: Once | INTRAMUSCULAR | Status: AC
  Administered 2013-08-01: 1000 ug via INTRAMUSCULAR

## 2013-08-01 NOTE — Progress Notes (Signed)
Pre visit review using our clinic review tool, if applicable. No additional management support is needed unless otherwise documented below in the visit note. 

## 2013-08-01 NOTE — Progress Notes (Signed)
Subjective:    Patient ID: Nicholas Burton, male    DOB: 25-May-1928, 79 y.o.   MRN: [id number]  Diabetes He presents for his follow-up diabetic visit. He has type 2 diabetes mellitus. His disease course has been stable. There are no hypoglycemic associated symptoms. Pertinent negatives for hypoglycemia include no dizziness or headaches. Pertinent negatives for diabetes include no blurred vision, no chest pain, no fatigue, no foot paresthesias, no foot ulcerations, no polydipsia, no polyphagia, no polyuria, no visual change, no weakness and no weight loss. There are no hypoglycemic complications. Symptoms are stable. There are no diabetic complications. Current diabetic treatment includes oral agent (dual therapy). He is compliant with treatment all of the time. His weight is stable. He is following a generally healthy diet. Meal planning includes avoidance of concentrated sweets. He participates in exercise intermittently. There is no change in his home blood glucose trend. An ACE inhibitor/angiotensin II receptor blocker is being taken. He does not see a podiatrist.Eye exam is current.      Review of Systems  Constitutional: Negative.  Negative for fever, chills, weight loss, diaphoresis, appetite change and fatigue.  HENT: Negative.   Eyes: Negative.  Negative for blurred vision.  Respiratory: Negative.  Negative for cough, choking, chest tightness, shortness of breath, wheezing and stridor.   Cardiovascular: Negative.  Negative for chest pain, palpitations and leg swelling.  Gastrointestinal: Negative.  Negative for nausea, vomiting, abdominal pain, diarrhea, constipation and rectal pain.  Endocrine: Negative.  Negative for polydipsia, polyphagia and polyuria.  Genitourinary: Negative.   Musculoskeletal: Negative.   Skin: Negative.   Allergic/Immunologic: Negative.   Neurological: Negative.  Negative for dizziness, weakness, light-headedness and headaches.  Hematological: Negative.  Negative  for adenopathy. Does not bruise/bleed easily.  Psychiatric/Behavioral: Negative.        Objective:   Physical Exam  Vitals reviewed. Constitutional: He is oriented to person, place, and time. He appears well-developed and well-nourished. No distress.  HENT:  Head: Normocephalic and atraumatic.  Mouth/Throat: Oropharynx is clear and moist. No oropharyngeal exudate.  Eyes: Conjunctivae are normal. Right eye exhibits no discharge. Left eye exhibits no discharge. No scleral icterus.  Neck: Normal range of motion. Neck supple. No JVD present. No tracheal deviation present. No thyromegaly present.  Cardiovascular: Normal rate, regular rhythm, normal heart sounds and intact distal pulses.  Exam reveals no gallop and no friction rub.   No murmur heard. Pulmonary/Chest: Effort normal and breath sounds normal. No stridor. No respiratory distress. He has no wheezes. He has no rales. He exhibits no tenderness.  Abdominal: Soft. Bowel sounds are normal. He exhibits no distension and no mass. There is no tenderness. There is no rebound and no guarding.  Musculoskeletal: Normal range of motion. He exhibits no edema and no tenderness.  Lymphadenopathy:    He has no cervical adenopathy.  Neurological: He is oriented to person, place, and time.  Skin: Skin is warm and dry. No rash noted. He is not diaphoretic. No erythema. No pallor.  Psychiatric: He has a normal mood and affect. His behavior is normal. Judgment and thought content normal.     Lab Results  Component Value Date   WBC 9.8 06/03/2012   HGB 14.8 06/03/2012   HCT 46.0 06/03/2012   PLT 223.0 09/08/2011   GLUCOSE 81 04/01/2013   CHOL 66 11/29/2012   TRIG 54.0 11/29/2012   HDL 32.40* 11/29/2012   LDLCALC 23 11/29/2012   ALT 27 11/29/2012   AST 28 11/29/2012  NA 143 04/01/2013   K 3.9 04/01/2013   CL 105 04/01/2013   CREATININE 0.9 04/01/2013   BUN 11 04/01/2013   CO2 32 04/01/2013   TSH 1.84 04/05/2011   INR 1.3 ratio* 08/11/2010   HGBA1C 7.5* 04/01/2013    MICROALBUR 1.2 01/07/2010       Assessment & Plan:

## 2013-08-01 NOTE — Assessment & Plan Note (Signed)
His A1C shows that his blood sugars are well controlled 

## 2013-08-01 NOTE — Patient Instructions (Signed)
Type 2 Diabetes Mellitus, Adult Type 2 diabetes mellitus, often simply referred to as type 2 diabetes, is a long-lasting (chronic) disease. In type 2 diabetes, the pancreas does not make enough insulin (a hormone), the cells are less responsive to the insulin that is made (insulin resistance), or both. Normally, insulin moves sugars from food into the tissue cells. The tissue cells use the sugars for energy. The lack of insulin or the lack of normal response to insulin causes excess sugars to build up in the blood instead of going into the tissue cells. As a result, high blood sugar (hyperglycemia) develops. The effect of high sugar (glucose) levels can cause many complications. Type 2 diabetes was also previously called adult-onset diabetes but it can occur at any age.  RISK FACTORS  A person is predisposed to developing type 2 diabetes if someone in the family has the disease and also has one or more of the following primary risk factors:  Overweight.  An inactive lifestyle.  A history of consistently eating high-calorie foods. Maintaining a normal weight and regular physical activity can reduce the chance of developing type 2 diabetes. SYMPTOMS  A person with type 2 diabetes may not show symptoms initially. The symptoms of type 2 diabetes appear slowly. The symptoms include:  Increased thirst (polydipsia).  Increased urination (polyuria).  Increased urination during the night (nocturia).  Weight loss. This weight loss may be rapid.  Frequent, recurring infections.  Tiredness (fatigue).  Weakness.  Vision changes, such as blurred vision.  Fruity smell to your breath.  Abdominal pain.  Nausea or vomiting.  Cuts or bruises which are slow to heal.  Tingling or numbness in the hands or feet. DIAGNOSIS Type 2 diabetes is frequently not diagnosed until complications of diabetes are present. Type 2 diabetes is diagnosed when symptoms or complications are present and when blood  glucose levels are increased. Your blood glucose level may be checked by one or more of the following blood tests:  A fasting blood glucose test. You will not be allowed to eat for at least 8 hours before a blood sample is taken.  A random blood glucose test. Your blood glucose is checked at any time of the day regardless of when you ate.  A hemoglobin A1c blood glucose test. A hemoglobin A1c test provides information about blood glucose control over the previous 3 months.  An oral glucose tolerance test (OGTT). Your blood glucose is measured after you have not eaten (fasted) for 2 hours and then after you drink a glucose-containing beverage. TREATMENT   You may need to take insulin or diabetes medicine daily to keep blood glucose levels in the desired range.  You will need to match insulin dosing with exercise and healthy food choices. The treatment goal is to maintain the before meal blood sugar (preprandial glucose) level at 70 130 mg/dL. HOME CARE INSTRUCTIONS   Have your hemoglobin A1c level checked twice a year.  Perform daily blood glucose monitoring as directed by your caregiver.  Monitor urine ketones when you are ill and as directed by your caregiver.  Take your diabetes medicine or insulin as directed by your caregiver to maintain your blood glucose levels in the desired range.  Never run out of diabetes medicine or insulin. It is needed every day.  Adjust insulin based on your intake of carbohydrates. Carbohydrates can raise blood glucose levels but need to be included in your diet. Carbohydrates provide vitamins, minerals, and fiber which are an essential part of   a healthy diet. Carbohydrates are found in fruits, vegetables, whole grains, dairy products, legumes, and foods containing added sugars.    Eat healthy foods. Alternate 3 meals with 3 snacks.  Lose weight if overweight.  Carry a medical alert card or wear your medical alert jewelry.  Carry a 15 gram  carbohydrate snack with you at all times to treat low blood glucose (hypoglycemia). Some examples of 15 gram carbohydrate snacks include:  Glucose tablets, 3 or 4   Glucose gel, 15 gram tube  Raisins, 2 tablespoons (24 grams)  Jelly beans, 6  Animal crackers, 8  Regular pop, 4 ounces (120 mL)  Gummy treats, 9  Recognize hypoglycemia. Hypoglycemia occurs with blood glucose levels of 70 mg/dL and below. The risk for hypoglycemia increases when fasting or skipping meals, during or after intense exercise, and during sleep. Hypoglycemia symptoms can include:  Tremors or shakes.  Decreased ability to concentrate.  Sweating.  Increased heart rate.  Headache.  Dry mouth.  Hunger.  Irritability.  Anxiety.  Restless sleep.  Altered speech or coordination.  Confusion.  Treat hypoglycemia promptly. If you are alert and able to safely swallow, follow the 15:15 rule:  Take 15 20 grams of rapid-acting glucose or carbohydrate. Rapid-acting options include glucose gel, glucose tablets, or 4 ounces (120 mL) of fruit juice, regular soda, or low fat milk.  Check your blood glucose level 15 minutes after taking the glucose.  Take 15 20 grams more of glucose if the repeat blood glucose level is still 70 mg/dL or below.  Eat a meal or snack within 1 hour once blood glucose levels return to normal.    Be alert to polyuria and polydipsia which are early signs of hyperglycemia. An early awareness of hyperglycemia allows for prompt treatment. Treat hyperglycemia as directed by your caregiver.  Engage in at least 150 minutes of moderate-intensity physical activity a week, spread over at least 3 days of the week or as directed by your caregiver. In addition, you should engage in resistance exercise at least 2 times a week or as directed by your caregiver.  Adjust your medicine and food intake as needed if you start a new exercise or sport.  Follow your sick day plan at any time you  are unable to eat or drink as usual.  Avoid tobacco use.  Limit alcohol intake to no more than 1 drink per day for nonpregnant women and 2 drinks per day for men. You should drink alcohol only when you are also eating food. Talk with your caregiver whether alcohol is safe for you. Tell your caregiver if you drink alcohol several times a week.  Follow up with your caregiver regularly.  Schedule an eye exam soon after the diagnosis of type 2 diabetes and then annually.  Perform daily skin and foot care. Examine your skin and feet daily for cuts, bruises, redness, nail problems, bleeding, blisters, or sores. A foot exam by a caregiver should be done annually.  Brush your teeth and gums at least twice a day and floss at least once a day. Follow up with your dentist regularly.  Share your diabetes management plan with your workplace or school.  Stay up-to-date with immunizations.  Learn to manage stress.  Obtain ongoing diabetes education and support as needed.  Participate in, or seek rehabilitation as needed to maintain or improve independence and quality of life. Request a physical or occupational therapy referral if you are having foot or hand numbness or difficulties with grooming,   dressing, eating, or physical activity. SEEK MEDICAL CARE IF:   You are unable to eat food or drink fluids for more than 6 hours.  You have nausea and vomiting for more than 6 hours.  Your blood glucose level is over 240 mg/dL.  There is a change in mental status.  You develop an additional serious illness.  You have diarrhea for more than 6 hours.  You have been sick or have had a fever for a couple of days and are not getting better.  You have pain during any physical activity.  SEEK IMMEDIATE MEDICAL CARE IF:  You have difficulty breathing.  You have moderate to large ketone levels. MAKE SURE YOU:  Understand these instructions.  Will watch your condition.  Will get help right away if  you are not doing well or get worse. Document Released: 08/15/2005 Document Revised: 05/09/2012 Document Reviewed: 03/13/2012 [employer] Patient Information 2014 [employer].  

## 2013-08-01 NOTE — Assessment & Plan Note (Signed)
His BP is well controlled Lytes and renal function are normal 

## 2013-08-07 DIAGNOSIS — L570 Actinic keratosis: Secondary | ICD-10-CM | POA: Diagnosis not present

## 2013-08-07 DIAGNOSIS — L821 Other seborrheic keratosis: Secondary | ICD-10-CM | POA: Diagnosis not present

## 2013-08-07 DIAGNOSIS — K031 Abrasion of teeth: Secondary | ICD-10-CM | POA: Diagnosis not present

## 2013-08-07 DIAGNOSIS — D045 Carcinoma in situ of skin of trunk: Secondary | ICD-10-CM | POA: Diagnosis not present

## 2013-08-07 DIAGNOSIS — D485 Neoplasm of uncertain behavior of skin: Secondary | ICD-10-CM | POA: Diagnosis not present

## 2013-08-07 DIAGNOSIS — Z85828 Personal history of other malignant neoplasm of skin: Secondary | ICD-10-CM | POA: Diagnosis not present

## 2013-11-14 ENCOUNTER — Other Ambulatory Visit: Payer: Self-pay | Admitting: Internal Medicine

## 2013-11-16 ENCOUNTER — Other Ambulatory Visit: Payer: Self-pay | Admitting: Internal Medicine

## 2013-11-17 ENCOUNTER — Other Ambulatory Visit: Payer: Self-pay | Admitting: Internal Medicine

## 2013-11-22 ENCOUNTER — Other Ambulatory Visit: Payer: Self-pay | Admitting: Internal Medicine

## 2013-11-26 DIAGNOSIS — Z961 Presence of intraocular lens: Secondary | ICD-10-CM | POA: Diagnosis not present

## 2013-11-26 DIAGNOSIS — E119 Type 2 diabetes mellitus without complications: Secondary | ICD-10-CM | POA: Diagnosis not present

## 2013-11-26 DIAGNOSIS — H353190 Nonexudative age-related macular degeneration, unspecified eye, stage unspecified: Secondary | ICD-10-CM | POA: Diagnosis not present

## 2013-11-27 LAB — HM DIABETES EYE EXAM

## 2013-11-30 ENCOUNTER — Other Ambulatory Visit: Payer: Self-pay | Admitting: Internal Medicine

## 2013-12-02 ENCOUNTER — Ambulatory Visit (INDEPENDENT_AMBULATORY_CARE_PROVIDER_SITE_OTHER): Payer: Medicare Other | Admitting: Internal Medicine

## 2013-12-02 ENCOUNTER — Encounter: Payer: Self-pay | Admitting: Internal Medicine

## 2013-12-02 ENCOUNTER — Other Ambulatory Visit (INDEPENDENT_AMBULATORY_CARE_PROVIDER_SITE_OTHER): Payer: Medicare Other

## 2013-12-02 ENCOUNTER — Telehealth: Payer: Self-pay | Admitting: Oncology

## 2013-12-02 ENCOUNTER — Telehealth: Payer: Self-pay | Admitting: Internal Medicine

## 2013-12-02 VITALS — BP 138/72 | HR 69 | Temp 97.9°F | Resp 16 | Ht 69.0 in | Wt 205.8 lb

## 2013-12-02 DIAGNOSIS — I10 Essential (primary) hypertension: Secondary | ICD-10-CM | POA: Diagnosis not present

## 2013-12-02 DIAGNOSIS — E1165 Type 2 diabetes mellitus with hyperglycemia: Principal | ICD-10-CM

## 2013-12-02 DIAGNOSIS — E785 Hyperlipidemia, unspecified: Secondary | ICD-10-CM

## 2013-12-02 DIAGNOSIS — Z9889 Other specified postprocedural states: Secondary | ICD-10-CM

## 2013-12-02 DIAGNOSIS — IMO0001 Reserved for inherently not codable concepts without codable children: Secondary | ICD-10-CM

## 2013-12-02 DIAGNOSIS — Z85820 Personal history of malignant melanoma of skin: Secondary | ICD-10-CM | POA: Insufficient documentation

## 2013-12-02 LAB — BASIC METABOLIC PANEL
BUN: 19 mg/dL (ref 6–23)
CALCIUM: 9.3 mg/dL (ref 8.4–10.5)
CHLORIDE: 104 meq/L (ref 96–112)
CO2: 31 meq/L (ref 19–32)
Creatinine, Ser: 0.9 mg/dL (ref 0.4–1.5)
GFR: 81.28 mL/min (ref >60.00)
GLUCOSE: 78 mg/dL (ref 70–99)
Potassium: 4.5 mEq/L (ref 3.5–5.1)
SODIUM: 141 meq/L (ref 135–145)

## 2013-12-02 LAB — LIPID PANEL
CHOL/HDL RATIO: 2
Cholesterol: 69 mg/dL (ref 0–200)
HDL: 33.9 mg/dL — AB (ref >39.00)
LDL Cholesterol: 28 mg/dL (ref 0–99)
Triglycerides: 36 mg/dL (ref 0.0–149.0)
VLDL: 7.2 mg/dL (ref 0.0–40.0)

## 2013-12-02 LAB — HEMOGLOBIN A1C: Hgb A1c MFr Bld: 7.2 % — ABNORMAL HIGH (ref 4.6–6.5)

## 2013-12-02 LAB — TSH: TSH: 2.73 u[IU]/mL (ref 0.35–5.50)

## 2013-12-02 MED ORDER — COLESEVELAM HCL 3.75 G PO PACK: PACK | ORAL | Status: DC

## 2013-12-02 MED ORDER — SAXAGLIPTIN-METFORMIN ER 2.5-1000 MG PO TB24: ORAL_TABLET | ORAL | Status: DC

## 2013-12-02 MED ORDER — ATORVASTATIN CALCIUM 40 MG PO TABS: ORAL_TABLET | ORAL | Status: DC

## 2013-12-02 MED ORDER — LOSARTAN POTASSIUM-HCTZ 100-12.5 MG PO TABS
ORAL_TABLET | ORAL | Status: DC
Start: 2013-12-02 — End: 2014-05-19

## 2013-12-02 MED ORDER — INSULIN PEN NEEDLE 31G X 8 MM MISC: Status: DC

## 2013-12-02 MED ORDER — INSULIN GLARGINE 100 UNIT/ML SOLOSTAR PEN: PEN_INJECTOR | SUBCUTANEOUS | Status: DC

## 2013-12-02 NOTE — Patient Instructions (Signed)
Type 2 Diabetes Mellitus, Adult Type 2 diabetes mellitus, often simply referred to as type 2 diabetes, is a long-lasting (chronic) disease. In type 2 diabetes, the pancreas does not make enough insulin (a hormone), the cells are less responsive to the insulin that is made (insulin resistance), or both. Normally, insulin moves sugars from food into the tissue cells. The tissue cells use the sugars for energy. The lack of insulin or the lack of normal response to insulin causes excess sugars to build up in the blood instead of going into the tissue cells. As a result, high blood sugar (hyperglycemia) develops. The effect of high sugar (glucose) levels can cause many complications. Type 2 diabetes was also previously called adult-onset diabetes but it can occur at any age.  RISK FACTORS  A person is predisposed to developing type 2 diabetes if someone in the family has the disease and also has one or more of the following primary risk factors:  Overweight.  An inactive lifestyle.  A history of consistently eating high-calorie foods. Maintaining a normal weight and regular physical activity can reduce the chance of developing type 2 diabetes. SYMPTOMS  A person with type 2 diabetes may not show symptoms initially. The symptoms of type 2 diabetes appear slowly. The symptoms include:  Increased thirst (polydipsia).  Increased urination (polyuria).  Increased urination during the night (nocturia).  Weight loss. This weight loss may be rapid.  Frequent, recurring infections.  Tiredness (fatigue).  Weakness.  Vision changes, such as blurred vision.  Fruity smell to your breath.  Abdominal pain.  Nausea or vomiting.  Cuts or bruises which are slow to heal.  Tingling or numbness in the hands or feet. DIAGNOSIS Type 2 diabetes is frequently not diagnosed until complications of diabetes are present. Type 2 diabetes is diagnosed when symptoms or complications are present and when blood  glucose levels are increased. Your blood glucose level may be checked by one or more of the following blood tests:  A fasting blood glucose test. You will not be allowed to eat for at least 8 hours before a blood sample is taken.  A random blood glucose test. Your blood glucose is checked at any time of the day regardless of when you ate.  A hemoglobin A1c blood glucose test. A hemoglobin A1c test provides information about blood glucose control over the previous 3 months.  An oral glucose tolerance test (OGTT). Your blood glucose is measured after you have not eaten (fasted) for 2 hours and then after you drink a glucose-containing beverage. TREATMENT   You may need to take insulin or diabetes medicine daily to keep blood glucose levels in the desired range.  You will need to match insulin dosing with exercise and healthy food choices. The treatment goal is to maintain the before meal blood sugar (preprandial glucose) level at 70 130 mg/dL. HOME CARE INSTRUCTIONS   Have your hemoglobin A1c level checked twice a year.  Perform daily blood glucose monitoring as directed by your caregiver.  Monitor urine ketones when you are ill and as directed by your caregiver.  Take your diabetes medicine or insulin as directed by your caregiver to maintain your blood glucose levels in the desired range.  Never run out of diabetes medicine or insulin. It is needed every day.  Adjust insulin based on your intake of carbohydrates. Carbohydrates can raise blood glucose levels but need to be included in your diet. Carbohydrates provide vitamins, minerals, and fiber which are an essential part of   a healthy diet. Carbohydrates are found in fruits, vegetables, whole grains, dairy products, legumes, and foods containing added sugars.    Eat healthy foods. Alternate 3 meals with 3 snacks.  Lose weight if overweight.  Carry a medical alert card or wear your medical alert jewelry.  Carry a 15 gram  carbohydrate snack with you at all times to treat low blood glucose (hypoglycemia). Some examples of 15 gram carbohydrate snacks include:  Glucose tablets, 3 or 4   Glucose gel, 15 gram tube  Raisins, 2 tablespoons (24 grams)  Jelly beans, 6  Animal crackers, 8  Regular pop, 4 ounces (120 mL)  Gummy treats, 9  Recognize hypoglycemia. Hypoglycemia occurs with blood glucose levels of 70 mg/dL and below. The risk for hypoglycemia increases when fasting or skipping meals, during or after intense exercise, and during sleep. Hypoglycemia symptoms can include:  Tremors or shakes.  Decreased ability to concentrate.  Sweating.  Increased heart rate.  Headache.  Dry mouth.  Hunger.  Irritability.  Anxiety.  Restless sleep.  Altered speech or coordination.  Confusion.  Treat hypoglycemia promptly. If you are alert and able to safely swallow, follow the 15:15 rule:  Take 15 20 grams of rapid-acting glucose or carbohydrate. Rapid-acting options include glucose gel, glucose tablets, or 4 ounces (120 mL) of fruit juice, regular soda, or low fat milk.  Check your blood glucose level 15 minutes after taking the glucose.  Take 15 20 grams more of glucose if the repeat blood glucose level is still 70 mg/dL or below.  Eat a meal or snack within 1 hour once blood glucose levels return to normal.    Be alert to polyuria and polydipsia which are early signs of hyperglycemia. An early awareness of hyperglycemia allows for prompt treatment. Treat hyperglycemia as directed by your caregiver.  Engage in at least 150 minutes of moderate-intensity physical activity a week, spread over at least 3 days of the week or as directed by your caregiver. In addition, you should engage in resistance exercise at least 2 times a week or as directed by your caregiver.  Adjust your medicine and food intake as needed if you start a new exercise or sport.  Follow your sick day plan at any time you  are unable to eat or drink as usual.  Avoid tobacco use.  Limit alcohol intake to no more than 1 drink per day for nonpregnant women and 2 drinks per day for men. You should drink alcohol only when you are also eating food. Talk with your caregiver whether alcohol is safe for you. Tell your caregiver if you drink alcohol several times a week.  Follow up with your caregiver regularly.  Schedule an eye exam soon after the diagnosis of type 2 diabetes and then annually.  Perform daily skin and foot care. Examine your skin and feet daily for cuts, bruises, redness, nail problems, bleeding, blisters, or sores. A foot exam by a caregiver should be done annually.  Brush your teeth and gums at least twice a day and floss at least once a day. Follow up with your dentist regularly.  Share your diabetes management plan with your workplace or school.  Stay up-to-date with immunizations.  Learn to manage stress.  Obtain ongoing diabetes education and support as needed.  Participate in, or seek rehabilitation as needed to maintain or improve independence and quality of life. Request a physical or occupational therapy referral if you are having foot or hand numbness or difficulties with grooming,   dressing, eating, or physical activity. SEEK MEDICAL CARE IF:   You are unable to eat food or drink fluids for more than 6 hours.  You have nausea and vomiting for more than 6 hours.  Your blood glucose level is over 240 mg/dL.  There is a change in mental status.  You develop an additional serious illness.  You have diarrhea for more than 6 hours.  You have been sick or have had a fever for a couple of days and are not getting better.  You have pain during any physical activity.  SEEK IMMEDIATE MEDICAL CARE IF:  You have difficulty breathing.  You have moderate to large ketone levels. MAKE SURE YOU:  Understand these instructions.  Will watch your condition.  Will get help right away if  you are not doing well or get worse. Document Released: 08/15/2005 Document Revised: 05/09/2012 Document Reviewed: 03/13/2012 [employer] Patient Information 2014 [employer].  

## 2013-12-02 NOTE — Telephone Encounter (Signed)
He was also referred to oncology, he can cancel the derm referral if he wishes

## 2013-12-02 NOTE — Telephone Encounter (Signed)
Patient called and states that he received a referral to dermatology today but he does not need a dermatologist but needs another Oncologist because his current Oncologist is at [facility] and it is getting difficult for him to travel there. Please advise.

## 2013-12-02 NOTE — Telephone Encounter (Signed)
LEFT MESSAGE FOR PATIENT TO RETURN CALL TO SCHEDULE NEW PATIENT APPT.  °

## 2013-12-02 NOTE — Telephone Encounter (Signed)
S/W PATIENT AND GAVE NEW PATIENT APPT FOR 06/04 @ 10:30 W/DR. SHADAD.

## 2013-12-02 NOTE — Progress Notes (Signed)
   Subjective:    Patient ID: Nicholas Burton, male    DOB: 08-28-1928, 80 y.o.   MRN: [id number]  Hypertension This is a chronic problem. The problem is unchanged. The problem is controlled. Pertinent negatives include no anxiety, blurred vision, chest pain, headaches, malaise/fatigue, neck pain, orthopnea, palpitations, peripheral edema, PND, shortness of breath or sweats. There are no associated agents to hypertension. Past treatments include angiotensin blockers and diuretics. The current treatment provides moderate improvement. Compliance problems include exercise and diet.       Review of Systems  Constitutional: Negative.  Negative for fever, chills, malaise/fatigue, diaphoresis, appetite change and fatigue.  HENT: Negative.   Eyes: Negative.  Negative for blurred vision.  Respiratory: Negative.  Negative for cough, choking, chest tightness, shortness of breath, wheezing and stridor.   Cardiovascular: Negative.  Negative for chest pain, palpitations, orthopnea, leg swelling and PND.  Gastrointestinal: Negative.  Negative for nausea, vomiting, abdominal pain, diarrhea, constipation and blood in stool.  Endocrine: Negative.  Negative for polydipsia, polyphagia and polyuria.  Genitourinary: Negative.   Musculoskeletal: Negative.  Negative for neck pain.  Skin: Negative.   Allergic/Immunologic: Negative.   Neurological: Negative.  Negative for dizziness, tremors, syncope, weakness, light-headedness, numbness and headaches.  Hematological: Negative.  Negative for adenopathy. Does not bruise/bleed easily.  Psychiatric/Behavioral: Negative.        Objective:   Physical Exam  Vitals reviewed. Constitutional: He is oriented to person, place, and time. He appears well-developed and well-nourished. No distress.  HENT:  Head: Normocephalic and atraumatic.  Mouth/Throat: Oropharynx is clear and moist. No oropharyngeal exudate.  Eyes: Conjunctivae are normal. Right eye exhibits no discharge.  Left eye exhibits no discharge. No scleral icterus.  Neck: Normal range of motion. Neck supple. No JVD present. No tracheal deviation present. No thyromegaly present.  Cardiovascular: Normal rate, regular rhythm, normal heart sounds and intact distal pulses.  Exam reveals no gallop and no friction rub.   No murmur heard. Pulmonary/Chest: Effort normal and breath sounds normal. No stridor. No respiratory distress. He has no wheezes. He has no rales. He exhibits no tenderness.  Abdominal: Soft. Bowel sounds are normal. He exhibits no distension and no mass. There is no tenderness. There is no rebound and no guarding.  Musculoskeletal: Normal range of motion. He exhibits edema (trace edema in BLE). He exhibits no tenderness.  Lymphadenopathy:    He has no cervical adenopathy.  Neurological: He is oriented to person, place, and time.  Skin: Skin is warm and dry. No rash noted. He is not diaphoretic. No erythema. No pallor.  Psychiatric: He has a normal mood and affect. His behavior is normal. Judgment and thought content normal.     Lab Results  Component Value Date   WBC 9.8 06/03/2012   HGB 14.8 06/03/2012   HCT 46.0 06/03/2012   PLT 223.0 09/08/2011   GLUCOSE 87 08/01/2013   CHOL 66 11/29/2012   TRIG 54.0 11/29/2012   HDL 32.40* 11/29/2012   LDLCALC 23 11/29/2012   ALT 27 11/29/2012   AST 28 11/29/2012   NA 140 08/01/2013   K 4.0 08/01/2013   CL 103 08/01/2013   CREATININE 1.0 08/01/2013   BUN 20 08/01/2013   CO2 30 08/01/2013   TSH 1.84 04/05/2011   INR 1.3 ratio* 08/11/2010   HGBA1C 7.3* 08/01/2013   MICROALBUR 1.2 01/07/2010       Assessment & Plan:

## 2013-12-02 NOTE — Telephone Encounter (Signed)
S/W PATIENT AND GAVE Nicholas PATIENT APPT FOR 06/04 @ 10:30 WDR. SHADAD.  Nicholas Burton Nicholas Burton DX  H/O MELANOMA EXCISION WELCOME PACKET MAILED.  FAXED ROI TO [facility] TO [facility].

## 2013-12-02 NOTE — Progress Notes (Signed)
Pre visit review using our clinic review tool, if applicable. No additional management support is needed unless otherwise documented below in the visit note. 

## 2013-12-03 ENCOUNTER — Telehealth: Payer: Self-pay | Admitting: Oncology

## 2013-12-03 ENCOUNTER — Encounter: Payer: Self-pay | Admitting: Internal Medicine

## 2013-12-03 NOTE — Telephone Encounter (Signed)
C/D 12/03/13 for appt. 01/30/14

## 2013-12-03 NOTE — Telephone Encounter (Signed)
LMOVM advising per MD. 

## 2013-12-03 NOTE — Assessment & Plan Note (Signed)
He has been followed by oncology at [facility] but he wants to be seen here in [place] for this

## 2013-12-03 NOTE — Assessment & Plan Note (Signed)
His blood sugar is adequately well controlled. 

## 2013-12-03 NOTE — Assessment & Plan Note (Signed)
He has met his LDL goal and is doing well on the statin

## 2013-12-03 NOTE — Assessment & Plan Note (Signed)
His BP is well controlled His lytes and renal function are stable 

## 2013-12-16 DIAGNOSIS — H43819 Vitreous degeneration, unspecified eye: Secondary | ICD-10-CM | POA: Diagnosis not present

## 2013-12-16 DIAGNOSIS — H353190 Nonexudative age-related macular degeneration, unspecified eye, stage unspecified: Secondary | ICD-10-CM | POA: Diagnosis not present

## 2014-01-22 ENCOUNTER — Other Ambulatory Visit: Payer: Self-pay | Admitting: Oncology

## 2014-01-22 DIAGNOSIS — C439 Malignant melanoma of skin, unspecified: Secondary | ICD-10-CM

## 2014-01-29 ENCOUNTER — Telehealth: Payer: Self-pay | Admitting: Medical Oncology

## 2014-01-29 NOTE — Telephone Encounter (Signed)
Courtesy call to patient for reminder of tomorrow's appt. Patient confirms appt, knows to bring list of current medications. Informed patient of free valet parking as well. Denies questions at this time.

## 2014-01-30 ENCOUNTER — Encounter: Payer: Self-pay | Admitting: Oncology

## 2014-01-30 ENCOUNTER — Other Ambulatory Visit (HOSPITAL_BASED_OUTPATIENT_CLINIC_OR_DEPARTMENT_OTHER): Payer: Medicare Other

## 2014-01-30 ENCOUNTER — Telehealth: Payer: Self-pay | Admitting: Oncology

## 2014-01-30 ENCOUNTER — Ambulatory Visit (HOSPITAL_BASED_OUTPATIENT_CLINIC_OR_DEPARTMENT_OTHER): Payer: Medicare Other | Admitting: Oncology

## 2014-01-30 ENCOUNTER — Ambulatory Visit: Payer: Medicare Other

## 2014-01-30 VITALS — BP 138/59 | HR 72 | Temp 97.2°F | Resp 18 | Ht 68.5 in | Wt 206.4 lb

## 2014-01-30 DIAGNOSIS — I10 Essential (primary) hypertension: Secondary | ICD-10-CM | POA: Diagnosis not present

## 2014-01-30 DIAGNOSIS — Z85820 Personal history of malignant melanoma of skin: Secondary | ICD-10-CM

## 2014-01-30 DIAGNOSIS — C439 Malignant melanoma of skin, unspecified: Secondary | ICD-10-CM

## 2014-01-30 DIAGNOSIS — E119 Type 2 diabetes mellitus without complications: Secondary | ICD-10-CM

## 2014-01-30 LAB — CBC WITH DIFFERENTIAL/PLATELET
BASO%: 0.6 % (ref 0.0–2.0)
Basophils Absolute: 0.1 10*3/uL (ref 0.0–0.1)
EOS ABS: 0.3 10*3/uL (ref 0.0–0.5)
EOS%: 3.2 % (ref 0.0–7.0)
HCT: 41.4 % (ref 38.4–49.9)
HGB: 13.9 g/dL (ref 13.0–17.1)
LYMPH%: 28.6 % (ref 14.0–49.0)
MCH: 28.3 pg (ref 27.2–33.4)
MCHC: 33.4 g/dL (ref 32.0–36.0)
MCV: 84.8 fL (ref 79.3–98.0)
MONO#: 0.9 10*3/uL (ref 0.1–0.9)
MONO%: 10 % (ref 0.0–14.0)
NEUT%: 57.6 % (ref 39.0–75.0)
NEUTROS ABS: 5.1 10*3/uL (ref 1.5–6.5)
PLATELETS: 219 10*3/uL (ref 140–400)
RBC: 4.89 10*6/uL (ref 4.20–5.82)
RDW: 13.7 % (ref 11.0–14.6)
WBC: 8.9 10*3/uL (ref 4.0–10.3)
lymph#: 2.6 10*3/uL (ref 0.9–3.3)

## 2014-01-30 LAB — COMPREHENSIVE METABOLIC PANEL (CC13)
ALBUMIN: 3.5 g/dL (ref 3.5–5.0)
ALT: 19 U/L (ref 0–55)
AST: 20 U/L (ref 5–34)
Alkaline Phosphatase: 123 U/L (ref 40–150)
Anion Gap: 14 mEq/L — ABNORMAL HIGH (ref 3–11)
BUN: 20.3 mg/dL (ref 7.0–26.0)
CALCIUM: 9.1 mg/dL (ref 8.4–10.4)
CHLORIDE: 104 meq/L (ref 98–109)
CO2: 25 meq/L (ref 22–29)
Creatinine: 1.1 mg/dL (ref 0.7–1.3)
GLUCOSE: 198 mg/dL — AB (ref 70–140)
Potassium: 3.9 mEq/L (ref 3.5–5.1)
Sodium: 143 mEq/L (ref 136–145)
Total Bilirubin: 0.77 mg/dL (ref 0.20–1.20)
Total Protein: 6.3 g/dL — ABNORMAL LOW (ref 6.4–8.3)

## 2014-01-30 LAB — LACTATE DEHYDROGENASE (CC13): LDH: 165 U/L (ref 125–245)

## 2014-01-30 NOTE — Progress Notes (Signed)
Please see consult note.  

## 2014-01-30 NOTE — Progress Notes (Signed)
Checked in new patient with no financial issues. He has appt card and he has not been out of the country. He has not seen the dr so no issues at this time.

## 2014-01-30 NOTE — Telephone Encounter (Signed)
gve the pt his June 2016 appt calendar along with the cxr appt.

## 2014-01-30 NOTE — Consult Note (Signed)
Reason for Referral: History of melanoma.   HPI: 80 year old gentleman currently of [place] where he lived the majority of his life. He is a gentleman with history of hypertension and diabetes and a history of melanoma her dates back to at least 2002. He presented with a lesion on his left arm and underwent an excision and sentinel lymph node biopsy on March 16 2001. I don't have the pathology of the actual melanoma but his sentinel lymph node biopsy was negative. He did have recurrence it appears to be in 2005 where he a repeat excision and a sentinel lymph node biopsy continued to be negative despite a repeat sampling. He had another recurrence in 2007 and was referred to [facility] where he had a reexcision at that time. He was found to have a Clark level III residual melanoma nodular type with thickness of 0.54 mm. He continued to have active surveillance under the care of [facility] and had initially PET CT scan in January of 2008. That did not show any evidence of disease and have not had any recurrence since that time. He had been getting surveillance on an annual basis and would like to continue this locally.  Clinically, he is asymptomatic. He continues to be active in relatively good health despite his age.He does not report any new skin lesions at this time. He continues to follow with dermatology locally. He does not report any headaches blurred vision or double vision. Does not report any alteration of mental status her psychiatric issues. Does not report any fevers or chills or sweats. Does not report any weight loss or appetite changes. Does not report changes in his performance status. Isn't reporting any chest pain shortness of breath cough or hemoptysis. Does not report any nausea or vomiting or abdominal pain. Does not report any change in his bowel habits or urinary symptoms. Does not report any skin rashes or lesions. Is not reporting any  lymphadenopathy or petechiae. He does not report any depression or anxiety. Rest of his review of systems unremarkable.   Past Medical History  Diagnosis Date  . Hypertension   . Hyperlipidemia   . Diabetes mellitus     type 2  . Osteoarthritis   . Vitamin D deficiency   . History of skin cancer   . Cataract   . Allergy   . Cancer     skin  :  Past Surgical History  Procedure Laterality Date  . Cholecystectomy    . Total knee arthroplasty    . Inguinal hernia repair    . Joint replacement    :  Current Outpatient Prescriptions  Medication Sig Dispense Refill  . aspirin 81 MG tablet Take 81 mg by mouth daily.        Marland Kitchen atorvastatin (LIPITOR) 40 MG tablet TAKE 1 TABLET DAILY  90 tablet  3  . Cholecalciferol 400 UNITS CAPS Take 1 capsule (400 Units total) by mouth once.  30 each  11  . Colesevelam HCl (WELCHOL) 3.75 G PACK MIX AND DRINK 1 PACKET DAILY  90 packet  3  . Cyanocobalamin (VITAMIN B-12 PO) Take by mouth.      . Insulin Glargine (LANTUS SOLOSTAR) 100 UNIT/ML Solostar Pen INJECT 0.4 ML (40 UNITS TOTAL) INTO THE SKIN DAILY  9 mL  3  . Insulin Pen Needle (B-D ULTRAFINE III Grams PEN) 31G X 8 MM MISC use as directed TID  300 each  3  . losartan-hydrochlorothiazide (HYZAAR) 100-12.5  MG per tablet TAKE 1 TABLET DAILY  90 tablet  3  . Multiple Vitamins-Minerals (PRESERVISION/LUTEIN) CAPS Take 1 capsule by mouth 2 (two) times daily.        . OMEGA 3 1000 MG CAPS Take 1 capsule by mouth daily.        . ONE TOUCH ULTRA TEST test strip USE 1 STRIP THREE TIMES A DAY  100 each  3  . Saxagliptin-Metformin (KOMBIGLYZE XR) 2.12-998 MG TB24 TAKE 2 TABLETS DAILY  180 tablet  3   No current facility-administered medications for this visit.       Allergies  Allergen Reactions  . Ace Inhibitors     REACTION: cough  . Oxycodone-Acetaminophen   . Penicillins   :  Family History  Problem Relation Age of Onset  . Diabetes Other   . Heart disease Mother   . Diabetes Mother   .  Heart disease Father   . Cancer Sister   . Cancer Brother   . Diabetes Son   :  History   Social History  . Marital Status: Married    Spouse Name: N/A    Number of Children: N/A  . Years of Education: N/A   Occupational History  . retired    Social History Main Topics  . Smoking status: Never Smoker   . Smokeless tobacco: Never Used  . Alcohol Use: No  . Drug Use: No  . Sexual Activity: Yes    Birth Control/ Protection: None   Other Topics Concern  . Not on file   Social History Narrative   No regular exercise  :  A comprehensive review of systems was negative.  Exam: ECOG 0 Blood pressure 138/59, pulse 72, temperature 97.2 F (36.2 C), temperature source Oral, resp. rate 18, height 5' 8.5" (1.74 m), weight 206 lb 6.4 oz (93.622 kg). General appearance: alert and cooperative Head: Normocephalic, without obvious abnormality Throat: lips, mucosa, and tongue normal; teeth and gums normal Neck: no adenopathy Resp: clear to auscultation bilaterally Chest wall: no tenderness, Slight thickness noted around the sternum area. No masses or lesions. Cardio: regular rate and rhythm, S1, S2 normal, no murmur, click, rub or gallop GI: soft, non-tender; bowel sounds normal; no masses,  no organomegaly Extremities: extremities normal, atraumatic, no cyanosis or edema Pulses: 2+ and symmetric Skin: Skin color, texture, turgor normal. No rashes or lesions   Recent Labs  01/30/14 1034  WBC 8.9  HGB 13.9  HCT 41.4  PLT 219     Assessment and Plan:   80 year old gentleman with the following issues:  1. History of melanoma dating back to 2002. He is status post surgical resection and sentinel lymph node biopsy of the left axilla for a lesion on his left arm. He did have a recurrence in 2005 and in 2007. His most recent recurrence in 2007 revealed 0.54 mm depth of invasion and a Clark level III. He status post repeat excision without any disease recurrence since that time.  He has been followed at [facility] on an annual basis and we will like to resume that locally. On today's examination, I see no evidence of recurrent disease by his physical examination and laboratory testing. Recommend continue surveillance with an annual examination and a repeat chest x-ray in one year. His medical records and imaging studies and as well as pathology from [facility] were reviewed today.  2. Routine skin health maintenance: I continue to recommend followup with dermatology as well as exercising  commonsense and following skin protection protocol. I continue to recommend avoiding prolonged sun exposure especially when he is working outside. I have recommended long sleeves, hats as well as sun block she has to be exposed to the sun for any period of time.  Os questions are answered today and he will come back in followup in one year.

## 2014-04-03 ENCOUNTER — Ambulatory Visit (INDEPENDENT_AMBULATORY_CARE_PROVIDER_SITE_OTHER): Payer: Medicare Other | Admitting: Internal Medicine

## 2014-04-03 ENCOUNTER — Encounter: Payer: Self-pay | Admitting: Internal Medicine

## 2014-04-03 ENCOUNTER — Other Ambulatory Visit (INDEPENDENT_AMBULATORY_CARE_PROVIDER_SITE_OTHER): Payer: Medicare Other

## 2014-04-03 VITALS — BP 132/79 | HR 68 | Temp 97.1°F | Resp 16 | Ht 68.5 in | Wt 207.0 lb

## 2014-04-03 DIAGNOSIS — IMO0001 Reserved for inherently not codable concepts without codable children: Secondary | ICD-10-CM

## 2014-04-03 DIAGNOSIS — I10 Essential (primary) hypertension: Secondary | ICD-10-CM

## 2014-04-03 DIAGNOSIS — E785 Hyperlipidemia, unspecified: Secondary | ICD-10-CM

## 2014-04-03 DIAGNOSIS — R0609 Other forms of dyspnea: Secondary | ICD-10-CM

## 2014-04-03 DIAGNOSIS — R0683 Snoring: Secondary | ICD-10-CM

## 2014-04-03 DIAGNOSIS — E559 Vitamin D deficiency, unspecified: Secondary | ICD-10-CM

## 2014-04-03 DIAGNOSIS — E538 Deficiency of other specified B group vitamins: Secondary | ICD-10-CM

## 2014-04-03 DIAGNOSIS — E1165 Type 2 diabetes mellitus with hyperglycemia: Secondary | ICD-10-CM

## 2014-04-03 DIAGNOSIS — R413 Other amnesia: Secondary | ICD-10-CM

## 2014-04-03 DIAGNOSIS — M1990 Unspecified osteoarthritis, unspecified site: Secondary | ICD-10-CM

## 2014-04-03 DIAGNOSIS — R0989 Other specified symptoms and signs involving the circulatory and respiratory systems: Secondary | ICD-10-CM

## 2014-04-03 HISTORY — DX: Snoring: R06.83

## 2014-04-03 LAB — BASIC METABOLIC PANEL
BUN: 22 mg/dL (ref 6–23)
CALCIUM: 9.3 mg/dL (ref 8.4–10.5)
CO2: 28 mEq/L (ref 19–32)
CREATININE: 1.1 mg/dL (ref 0.4–1.5)
Chloride: 105 mEq/L (ref 96–112)
GFR: 69.93 mL/min (ref >60.00)
Glucose, Bld: 96 mg/dL (ref 70–99)
Potassium: 4.7 mEq/L (ref 3.5–5.1)
Sodium: 140 mEq/L (ref 135–145)

## 2014-04-03 LAB — CBC WITH DIFFERENTIAL/PLATELET
BASOS ABS: 0 10*3/uL (ref 0.0–0.1)
BASOS PCT: 0.5 % (ref 0.0–3.0)
EOS ABS: 0.3 10*3/uL (ref 0.0–0.7)
Eosinophils Relative: 3 % (ref 0.0–5.0)
HCT: 40.7 % (ref 39.0–52.0)
Hemoglobin: 13.7 g/dL (ref 13.0–17.0)
Lymphocytes Relative: 35.3 % (ref 12.0–46.0)
Lymphs Abs: 3.2 10*3/uL (ref 0.7–4.0)
MCHC: 33.7 g/dL (ref 30.0–36.0)
MCV: 85 fl (ref 78.0–100.0)
MONO ABS: 0.7 10*3/uL (ref 0.1–1.0)
Monocytes Relative: 7.9 % (ref 3.0–12.0)
Neutro Abs: 4.9 10*3/uL (ref 1.4–7.7)
Neutrophils Relative %: 53.3 % (ref 43.0–77.0)
Platelets: 237 10*3/uL (ref 150.0–400.0)
RBC: 4.78 Mil/uL (ref 4.22–5.81)
RDW: 14 % (ref 11.5–15.5)
WBC: 9.2 10*3/uL (ref 4.0–10.5)

## 2014-04-03 LAB — HEMOGLOBIN A1C: Hgb A1c MFr Bld: 7.1 % — ABNORMAL HIGH (ref 4.6–6.5)

## 2014-04-03 NOTE — Progress Notes (Signed)
Subjective:    Patient ID: Nicholas Burton, male    DOB: Jun 21, 1928, 80 y.o.   MRN: [id number]  Diabetes He presents for his follow-up diabetic visit. He has type 2 diabetes mellitus. His disease course has been stable. There are no hypoglycemic associated symptoms. Pertinent negatives for hypoglycemia include no dizziness or tremors. Associated symptoms include fatigue. Pertinent negatives for diabetes include no chest pain, no foot paresthesias, no foot ulcerations, no polydipsia, no polyphagia, no polyuria, no visual change, no weakness and no weight loss. There are no hypoglycemic complications. There are no diabetic complications. There are no known risk factors for coronary artery disease. Current diabetic treatment includes oral agent (dual therapy). He is compliant with treatment all of the time. His weight is stable. He participates in exercise intermittently. An ACE inhibitor/angiotensin II receptor blocker is being taken. He does not see a podiatrist.Eye exam is current.      Review of Systems  Constitutional: Positive for fatigue and unexpected weight change (wt gain). Negative for fever, chills, weight loss, diaphoresis, activity change and appetite change.  HENT: Negative.   Eyes: Negative.   Respiratory: Positive for apnea (snoring and daytime sleepiness). Negative for cough, choking, chest tightness, shortness of breath, wheezing and stridor.   Cardiovascular: Negative.  Negative for chest pain, palpitations and leg swelling.  Gastrointestinal: Negative.  Negative for vomiting, abdominal pain, diarrhea, constipation and blood in stool.  Endocrine: Negative.  Negative for polydipsia, polyphagia and polyuria.  Genitourinary: Negative.   Musculoskeletal: Negative.  Negative for arthralgias, back pain and myalgias.  Skin: Negative.  Negative for rash.  Allergic/Immunologic: Negative.   Neurological: Negative.  Negative for dizziness, tremors, facial asymmetry, weakness and  light-headedness.  Hematological: Negative.  Negative for adenopathy. Does not bruise/bleed easily.  Psychiatric/Behavioral: Negative.        Objective:   Physical Exam  Vitals reviewed. Constitutional: He is oriented to person, place, and time. He appears well-developed and well-nourished. No distress.  HENT:  Head: Normocephalic and atraumatic.  Mouth/Throat: Oropharynx is clear and moist. No oropharyngeal exudate.  Eyes: Conjunctivae are normal. Right eye exhibits no discharge. Left eye exhibits no discharge. No scleral icterus.  Neck: Normal range of motion. Neck supple. No JVD present. No tracheal deviation present. No thyromegaly present.  Cardiovascular: Normal rate, regular rhythm, normal heart sounds and intact distal pulses.  Exam reveals no gallop and no friction rub.   No murmur heard. Pulmonary/Chest: Effort normal and breath sounds normal. No stridor. No respiratory distress. He has no wheezes. He has no rales. He exhibits no tenderness.  Abdominal: Soft. Bowel sounds are normal. He exhibits no distension and no mass. There is no tenderness. There is no rebound and no guarding.  Musculoskeletal: Normal range of motion. He exhibits no edema and no tenderness.  Lymphadenopathy:    He has no cervical adenopathy.  Neurological: He is oriented to person, place, and time.  Skin: Skin is warm and dry. No rash noted. He is not diaphoretic. No erythema. No pallor.  Psychiatric: Thought content normal.    Lab Results  Component Value Date   WBC 8.9 01/30/2014   HGB 13.9 01/30/2014   HCT 41.4 01/30/2014   PLT 219 01/30/2014   GLUCOSE 198* 01/30/2014   CHOL 69 12/02/2013   TRIG 36.0 12/02/2013   HDL 33.90* 12/02/2013   LDLCALC 28 12/02/2013   ALT 19 01/30/2014   AST 20 01/30/2014   NA 143 01/30/2014   K 3.9 01/30/2014   CL  104 12/02/2013   CREATININE 1.1 01/30/2014   BUN 20.3 01/30/2014   CO2 25 01/30/2014   TSH 2.73 12/02/2013   INR 1.3 ratio* 08/11/2010   HGBA1C 7.2* 12/02/2013   MICROALBUR 1.2  01/07/2010   Assessment & Plan:

## 2014-04-03 NOTE — Progress Notes (Signed)
Pre visit review using our clinic review tool, if applicable. No additional management support is needed unless otherwise documented below in the visit note. 

## 2014-04-03 NOTE — Patient Instructions (Signed)

## 2014-04-06 ENCOUNTER — Encounter: Payer: Self-pay | Admitting: Internal Medicine

## 2014-04-06 NOTE — Assessment & Plan Note (Signed)
His BP is well controlled His lytes and renal function are stable 

## 2014-04-06 NOTE — Assessment & Plan Note (Signed)
His blood sugars are well controlled 

## 2014-04-06 NOTE — Assessment & Plan Note (Signed)
Sleep med referral to be evaluated for OSA

## 2014-05-15 ENCOUNTER — Encounter: Payer: Self-pay | Admitting: Pulmonary Disease

## 2014-05-15 ENCOUNTER — Ambulatory Visit (INDEPENDENT_AMBULATORY_CARE_PROVIDER_SITE_OTHER): Payer: Medicare Other | Admitting: Pulmonary Disease

## 2014-05-15 VITALS — BP 120/90 | HR 92 | Ht 68.5 in | Wt 207.6 lb

## 2014-05-15 DIAGNOSIS — R0609 Other forms of dyspnea: Secondary | ICD-10-CM

## 2014-05-15 DIAGNOSIS — R0683 Snoring: Secondary | ICD-10-CM

## 2014-05-15 DIAGNOSIS — R0989 Other specified symptoms and signs involving the circulatory and respiratory systems: Secondary | ICD-10-CM

## 2014-05-15 NOTE — Progress Notes (Deleted)
   Subjective:    Patient ID: Nicholas Burton, male    DOB: 11/28/1927, 80 y.o.   MRN: [id number]  HPI    Review of Systems  Constitutional: Negative for fever and unexpected weight change.  HENT: Positive for sneezing. Negative for congestion, dental problem, ear pain, nosebleeds, postnasal drip, rhinorrhea, sinus pressure, sore throat and trouble swallowing.   Eyes: Negative for redness and itching.  Respiratory: Negative for cough, chest tightness, shortness of breath and wheezing.   Cardiovascular: Negative for palpitations and leg swelling.  Gastrointestinal: Negative for nausea and vomiting.       Acid heartburn  Genitourinary: Negative for dysuria.  Musculoskeletal: Positive for arthralgias and joint swelling.  Skin: Negative for rash.  Neurological: Negative for headaches.  Hematological: Does not bruise/bleed easily.  Psychiatric/Behavioral: Negative for dysphoric mood. The patient is not nervous/anxious.        Objective:   Physical Exam        Assessment & Plan:

## 2014-05-15 NOTE — Assessment & Plan Note (Signed)
He has snoring, sleep disruption, daytime sleepiness, and witnessed apnea.  He has frequent dreams, and might be acting out some of his dream activity while asleep.  He has history of hypertension and diabetes.    I am concerned he could have sleep apnea.  I had detailed discussion about diagnosis of sleep apnea, and how this can impact his sleep quality, daytime function, and health.  He does not feel like his sleep is as much of a problem.  He is not sure he wants to have sleep study done.  I discussed option of home versus in lab study.  Advised him to call once he decides about having a sleep study done.  Also asked him to d/w his PCP about whether he should consider having sleep study done.

## 2014-05-15 NOTE — Progress Notes (Signed)
Chief Complaint  Patient presents with  . SLEEP CONSULT    Referred by Dr Ronnald Ramp. Epworth Score: 6    History of Present Illness: Nicholas Burton is a 80 y.o. male for evaluation of sleep problems.  He was seen by PCP recently, and mentioned that he was feeling tired/fatigued.  He was also noted to have snoring.  There was concern he could have sleep apnea.  His wife says he will stop breathing at night.  He has trouble sleeping on his back, and wakes up several times during the night.  He gets frequent, vivid dreams.  His wife says he will sometimes appear to be working on tools while he is dreaming.  He knows several people who have sleep apnea, and is concerned he would not be able to tolerate therapy for sleep apnea.  He also feels his energy level was more of a problem in hot weather of summer.  Now that the weather is cooler he doesn't feel he has as much of an issues with feeling tired.  He goes to sleep at 12 am.  He falls asleep in a Donnan period of time.  He wakes up several times to use the bathroom.  He gets out of bed at 8 am.  He feels tired in the morning.  He denies morning headache.  He does not use anything to help him fall sleep or stay awake.  He denies sleep walking, sleep talking, bruxism, or nightmares.  There is no history of restless legs.  He denies sleep hallucinations, sleep paralysis, or cataplexy.  The Epworth score is 6 out of 24.   Nicholas Burton  has a past medical history of Hypertension; Hyperlipidemia; Diabetes mellitus; Osteoarthritis; Vitamin D deficiency; History of skin cancer; Cataract; Allergy; and Cancer.  Nicholas Burton  has past surgical history that includes Cholecystectomy; Total knee arthroplasty; Inguinal hernia repair; Joint replacement; Melanoma excision; and Knee surgery.  Prior to Admission medications   Medication Sig Start Date End Date Taking? Authorizing Provider  aspirin 81 MG tablet Take 81 mg by mouth daily.     Yes Historical Provider, MD   atorvastatin (LIPITOR) 40 MG tablet TAKE 1 TABLET DAILY 12/02/13  Yes [name], MD  Cholecalciferol 400 UNITS CAPS Take 1 capsule (400 Units total) by mouth once. 06/07/11  Yes [name], MD  Colesevelam HCl [facility]) 3.75 G PACK MIX AND DRINK 1 PACKET DAILY 12/02/13  Yes [name], MD  Cyanocobalamin (VITAMIN B-12 PO) Take by mouth.   Yes Historical Provider, MD  Insulin Glargine (LANTUS SOLOSTAR) 100 UNIT/ML Solostar Pen INJECT 0.4 ML (40 UNITS TOTAL) INTO THE SKIN DAILY 12/02/13  Yes [name], MD  Insulin Pen Needle (B-D ULTRAFINE III Graffeo PEN) 31G X 8 MM MISC use as directed TID 12/02/13  Yes [name], MD  losartan-hydrochlorothiazide [facility]) 100-12.5 MG per tablet TAKE 1 TABLET DAILY 12/02/13  Yes [name], MD  Multiple Vitamins-Minerals (PRESERVISION/LUTEIN) CAPS Take 1 capsule by mouth 2 (two) times daily.     Yes Historical Provider, MD  OMEGA 3 1000 MG CAPS Take 1 capsule by mouth daily.     Yes Historical Provider, MD  ONE TOUCH ULTRA TEST test strip USE 1 STRIP THREE TIMES A DAY 11/22/13  Yes [name], MD  Saxagliptin-Metformin (KOMBIGLYZE XR) 2.12-998 MG TB24 TAKE 2 TABLETS DAILY 12/02/13  Yes [name], MD    Allergies  Allergen Reactions  . Ace  Inhibitors     REACTION: cough  . Oxycodone-Acetaminophen   . Penicillins     His family history includes Cancer in his brother and sister; Diabetes in his mother, other, and son; Heart disease in his father and mother.  He  reports that he has never smoked. He has never used smokeless tobacco. He reports that he does not drink alcohol or use illicit drugs.  Review of Systems  Constitutional: Negative for fever and unexpected weight change.  HENT: Positive for sneezing. Negative for congestion, dental problem, ear pain, nosebleeds, postnasal drip, rhinorrhea, sinus pressure, sore throat and trouble swallowing.   Eyes: Negative for redness and itching.  Respiratory: Negative for cough, chest  tightness, shortness of breath and wheezing.   Cardiovascular: Negative for palpitations and leg swelling.  Gastrointestinal: Negative for nausea and vomiting.       Acid heartburn  Genitourinary: Negative for dysuria.  Musculoskeletal: Positive for arthralgias and joint swelling.  Skin: Negative for rash.  Neurological: Negative for headaches.  Hematological: Does not bruise/bleed easily.  Psychiatric/Behavioral: Negative for dysphoric mood. The patient is not nervous/anxious.    Physical Exam:  General - No distress ENT - No sinus tenderness, no oral exudate, no LAN, no thyromegaly, TM clear, pupils equal/reactive, MP 3, 2+ tonsils, wears glasses, has hearing aide Cardiac - s1s2 regular, no murmur, pulses symmetric Chest - No wheeze/rales/dullness, good air entry, normal respiratory excursion Back - No focal tenderness Abd - Soft, non-tender, no organomegaly, + bowel sounds Ext - No edema Neuro - Normal strength, cranial nerves intact Skin - No rashes Psych - Normal mood, and behavior  Assessment/plan:  Chesley Mires, M.D. Pager [phone]

## 2014-05-15 NOTE — Patient Instructions (Signed)
Call once you have decided about whether you want to have a sleep study done

## 2014-05-19 ENCOUNTER — Telehealth: Payer: Self-pay | Admitting: Internal Medicine

## 2014-05-19 DIAGNOSIS — I10 Essential (primary) hypertension: Secondary | ICD-10-CM

## 2014-05-19 MED ORDER — LOSARTAN POTASSIUM-HCTZ 100-12.5 MG PO TABS: ORAL_TABLET | ORAL | Status: DC

## 2014-05-19 NOTE — Telephone Encounter (Signed)
Notified pt spoke with his wife inform [facility]...Johny Chess

## 2014-05-19 NOTE — Telephone Encounter (Signed)
Pt called in looking for a refill for     losartan-hydrochlorothiazide (HYZAAR) 100-12.5 MG per tablet [[id number]]  He said that he is out and all [facility]    Thank You

## 2014-05-20 ENCOUNTER — Ambulatory Visit (INDEPENDENT_AMBULATORY_CARE_PROVIDER_SITE_OTHER): Payer: Medicare Other

## 2014-05-20 DIAGNOSIS — Z23 Encounter for immunization: Secondary | ICD-10-CM | POA: Diagnosis not present

## 2014-06-24 DIAGNOSIS — H43813 Vitreous degeneration, bilateral: Secondary | ICD-10-CM | POA: Diagnosis not present

## 2014-06-24 DIAGNOSIS — H3531 Nonexudative age-related macular degeneration: Secondary | ICD-10-CM | POA: Diagnosis not present

## 2014-08-04 ENCOUNTER — Ambulatory Visit: Payer: Medicare Other | Admitting: Internal Medicine

## 2014-08-07 DIAGNOSIS — L905 Scar conditions and fibrosis of skin: Secondary | ICD-10-CM | POA: Diagnosis not present

## 2014-08-07 DIAGNOSIS — L820 Inflamed seborrheic keratosis: Secondary | ICD-10-CM | POA: Diagnosis not present

## 2014-08-07 DIAGNOSIS — D485 Neoplasm of uncertain behavior of skin: Secondary | ICD-10-CM | POA: Diagnosis not present

## 2014-08-07 DIAGNOSIS — Z85820 Personal history of malignant melanoma of skin: Secondary | ICD-10-CM | POA: Diagnosis not present

## 2014-08-07 DIAGNOSIS — Z85828 Personal history of other malignant neoplasm of skin: Secondary | ICD-10-CM | POA: Diagnosis not present

## 2014-08-12 ENCOUNTER — Encounter: Payer: Self-pay | Admitting: Internal Medicine

## 2014-08-12 ENCOUNTER — Ambulatory Visit (INDEPENDENT_AMBULATORY_CARE_PROVIDER_SITE_OTHER)
Admission: RE | Admit: 2014-08-12 | Discharge: 2014-08-12 | Disposition: A | Discharge status: Home / Self Care | Payer: Medicare Other | Source: Ambulatory Visit | Attending: Internal Medicine | Admitting: Internal Medicine

## 2014-08-12 ENCOUNTER — Ambulatory Visit (INDEPENDENT_AMBULATORY_CARE_PROVIDER_SITE_OTHER): Payer: Medicare Other | Admitting: Internal Medicine

## 2014-08-12 ENCOUNTER — Other Ambulatory Visit (INDEPENDENT_AMBULATORY_CARE_PROVIDER_SITE_OTHER): Payer: Medicare Other

## 2014-08-12 VITALS — BP 130/82 | HR 88 | Temp 97.7°F | Resp 16 | Ht 68.5 in | Wt 201.0 lb

## 2014-08-12 DIAGNOSIS — R059 Cough, unspecified: Secondary | ICD-10-CM

## 2014-08-12 DIAGNOSIS — R05 Cough: Secondary | ICD-10-CM

## 2014-08-12 DIAGNOSIS — I10 Essential (primary) hypertension: Secondary | ICD-10-CM

## 2014-08-12 DIAGNOSIS — E118 Type 2 diabetes mellitus with unspecified complications: Secondary | ICD-10-CM

## 2014-08-12 DIAGNOSIS — J202 Acute bronchitis due to streptococcus: Secondary | ICD-10-CM

## 2014-08-12 DIAGNOSIS — R091 Pleurisy: Secondary | ICD-10-CM | POA: Diagnosis not present

## 2014-08-12 LAB — LIPID PANEL
Cholesterol: 71 mg/dL (ref 0–200)
HDL: 27.7 mg/dL — ABNORMAL LOW (ref >39.00)
LDL Cholesterol: 27 mg/dL (ref 0–99)
NonHDL: 43.3
Total CHOL/HDL Ratio: 3
Triglycerides: 84 mg/dL (ref 0.0–149.0)
VLDL: 16.8 mg/dL (ref 0.0–40.0)

## 2014-08-12 LAB — BASIC METABOLIC PANEL
BUN: 16 mg/dL (ref 6–23)
CO2: 29 mEq/L (ref 19–32)
Calcium: 9.2 mg/dL (ref 8.4–10.5)
Chloride: 101 mEq/L (ref 96–112)
Creatinine, Ser: 1.1 mg/dL (ref 0.4–1.5)
GFR: 69.13 mL/min (ref >60.00)
Glucose, Bld: 97 mg/dL (ref 70–99)
Potassium: 3.8 mEq/L (ref 3.5–5.1)
Sodium: 138 mEq/L (ref 135–145)

## 2014-08-12 LAB — HEMOGLOBIN A1C: HEMOGLOBIN A1C: 7.6 % — AB (ref 4.6–6.5)

## 2014-08-12 MED ORDER — AZITHROMYCIN 500 MG PO TABS: 500.0000 mg | ORAL_TABLET | Freq: Every day | ORAL | Status: DC

## 2014-08-12 MED ORDER — HYDROCODONE-HOMATROPINE 5-1.5 MG/5ML PO SYRP: 5.0000 mL | ORAL_SOLUTION | Freq: Three times a day (TID) | ORAL | Status: DC | PRN

## 2014-08-12 NOTE — Patient Instructions (Signed)
Cough, Adult  A cough is a reflex that helps clear your throat and airways. It can help heal the body or may be a reaction to an irritated airway. A cough may only last 2 or 3 weeks (acute) or may last more than 8 weeks (chronic).  CAUSES Acute cough:  Viral or bacterial infections. Chronic cough:  Infections.  Allergies.  Asthma.  Post-nasal drip.  Smoking.  Heartburn or acid reflux.  Some medicines.  Chronic lung problems (COPD).  Cancer. SYMPTOMS   Cough.  Fever.  Chest pain.  Increased breathing rate.  High-pitched whistling sound when breathing (wheezing).  Colored mucus that you cough up (sputum). TREATMENT   A bacterial cough may be treated with antibiotic medicine.  A viral cough must run its course and will not respond to antibiotics.  Your caregiver may recommend other treatments if you have a chronic cough. HOME CARE INSTRUCTIONS   Only take over-the-counter or prescription medicines for pain, discomfort, or fever as directed by your caregiver. Use cough suppressants only as directed by your caregiver.  Use a cold steam vaporizer or humidifier in your bedroom or home to help loosen secretions.  Sleep in a semi-upright position if your cough is worse at night.  Rest as needed.  Stop smoking if you smoke. SEEK IMMEDIATE MEDICAL CARE IF:   You have pus in your sputum.  Your cough starts to worsen.  You cannot control your cough with suppressants and are losing sleep.  You begin coughing up blood.  You have difficulty breathing.  You develop pain which is getting worse or is uncontrolled with medicine.  You have a fever. MAKE SURE YOU:   Understand these instructions.  Will watch your condition.  Will get help right away if you are not doing well or get worse. Document Released: 02/11/2011 Document Revised: 11/07/2011 Document Reviewed: 02/11/2011 ExitCare Patient Information 2015 [employer]. This information is not intended  to replace advice given to you by your health care provider. Make sure you discuss any questions you have with your health care provider.  

## 2014-08-12 NOTE — Assessment & Plan Note (Signed)
I will treat the infection with Zpak and will control the cough with hycodan

## 2014-08-12 NOTE — Assessment & Plan Note (Signed)
His BP is well controlled I will monitor his lytes and renal function 

## 2014-08-12 NOTE — Progress Notes (Signed)
Subjective:    Patient ID: Nicholas Burton, male    DOB: 1928/08/03, 80 y.o.   MRN: [id number]  Cough This is a new problem. The current episode started in the past 7 days. The problem has been unchanged. The problem occurs every few hours. The cough is productive of purulent sputum. Associated symptoms include chills, nasal congestion, postnasal drip, rhinorrhea, a sore throat and sweats. Pertinent negatives include no chest pain, ear congestion, ear pain, fever, headaches, heartburn, hemoptysis, myalgias, rash, shortness of breath, weight loss or wheezing. He has tried OTC cough suppressant for the symptoms. The treatment provided mild relief.      Review of Systems  Constitutional: Positive for chills. Negative for fever, weight loss, diaphoresis, activity change, appetite change, fatigue and unexpected weight change.  HENT: Positive for postnasal drip, rhinorrhea and sore throat. Negative for congestion, ear pain, sinus pressure, sneezing and trouble swallowing.   Eyes: Negative.   Respiratory: Positive for cough. Negative for hemoptysis, choking, chest tightness, shortness of breath, wheezing and stridor.   Cardiovascular: Negative.  Negative for chest pain, palpitations and leg swelling.  Gastrointestinal: Negative.  Negative for heartburn, nausea, vomiting, abdominal pain, diarrhea and blood in stool.  Endocrine: Negative.  Negative for polydipsia, polyphagia and polyuria.  Genitourinary: Negative.   Musculoskeletal: Negative.  Negative for myalgias.  Skin: Negative.  Negative for rash.  Allergic/Immunologic: Negative.   Neurological: Negative.  Negative for headaches.  Hematological: Negative.  Negative for adenopathy. Does not bruise/bleed easily.  Psychiatric/Behavioral: Negative.        Objective:   Physical Exam  Constitutional: He is oriented to person, place, and time. He appears well-developed and well-nourished.  Non-toxic appearance. He does not have a sickly appearance.  He does not appear ill. No distress.  HENT:  Head: Normocephalic and atraumatic.  Mouth/Throat: Oropharynx is clear and moist. No oropharyngeal exudate.  Eyes: Conjunctivae are normal. Right eye exhibits no discharge. Left eye exhibits no discharge. No scleral icterus.  Neck: Normal range of motion. Neck supple. No JVD present. No tracheal deviation present. No thyromegaly present.  Cardiovascular: Normal rate, regular rhythm, normal heart sounds and intact distal pulses.  Exam reveals no gallop and no friction rub.   No murmur heard. Pulmonary/Chest: Effort normal. No accessory muscle usage or stridor. No respiratory distress. He has no decreased breath sounds. He has no wheezes. He has rhonchi in the right upper field, the right middle field, the left upper field and the left middle field. He has no rales. He exhibits no tenderness.  Abdominal: Soft. Bowel sounds are normal. He exhibits no distension. There is no tenderness. There is no rebound and no guarding.  Musculoskeletal: Normal range of motion. He exhibits no edema or tenderness.  Lymphadenopathy:    He has no cervical adenopathy.  Neurological: He is oriented to person, place, and time.  Skin: Skin is warm and dry. No rash noted. He is not diaphoretic. No erythema. No pallor.  Vitals reviewed.     Lab Results  Component Value Date   WBC 9.2 04/03/2014   HGB 13.7 04/03/2014   HCT 40.7 04/03/2014   PLT 237.0 04/03/2014   GLUCOSE 96 04/03/2014   CHOL 69 12/02/2013   TRIG 36.0 12/02/2013   HDL 33.90* 12/02/2013   LDLCALC 28 12/02/2013   ALT 19 01/30/2014   AST 20 01/30/2014   NA 140 04/03/2014   K 4.7 04/03/2014   CL 105 04/03/2014   CREATININE 1.1 04/03/2014   BUN 22  04/03/2014   CO2 28 04/03/2014   TSH 2.73 12/02/2013   INR 1.3 ratio* 08/11/2010   HGBA1C 7.1* 04/03/2014   MICROALBUR 1.2 01/07/2010      Assessment & Plan:

## 2014-08-12 NOTE — Assessment & Plan Note (Signed)
I will check his CXR to see if there is PNA, mass , edema 

## 2014-08-12 NOTE — Assessment & Plan Note (Signed)
I will recheck his A1C and will address if needed

## 2014-08-20 ENCOUNTER — Telehealth: Payer: Self-pay | Admitting: Internal Medicine

## 2014-08-20 ENCOUNTER — Telehealth: Payer: Self-pay | Admitting: *Deleted

## 2014-08-20 NOTE — Telephone Encounter (Signed)
[facility] - Client TELEPHONE ADVICE RECORD [facility] Patient Name: Nicholas Burton Gender: Male DOB: [birth year] Age: 80 Y 55 M 23 D Return Phone Number: [phone] (Primary) Address: City/State/Zip: [place] [employer] [facility] - Client Client Site [place] [facility] [name] [place] Type Call Caller Name Nicholas Burton Phone Number [phone] Relationship To Patient Spouse Is this call to report lab results? No Call Type General Information Initial Comment Caller states her husband is out of test strips, need new rx for different meter and strips, requests call back General Information Type Hours of Operation Nurse Assessment Guidelines Guideline Title Affirmed Question Affirmed Notes Nurse Date/Time (Eastern Time) Disp. Time Eilene Ghazi Time) Disposition Final User 08/20/2014 12:08:34 PM General Information Provided Yes Dalia Heading After Care Instructions Given Call Event Type User Date / Time Description

## 2014-08-20 NOTE — Telephone Encounter (Signed)
Pt calling requesting more free style test strips and a meter, preferably what [facility] will cover for him. [facility] preferred Pt's [phone]

## 2014-08-21 MED ORDER — FREESTYLE FREEDOM LITE W/DEVICE KIT: PACK | Status: DC

## 2014-08-21 MED ORDER — FREESTYLE LANCETS MISC: Status: DC

## 2014-08-21 MED ORDER — GLUCOSE BLOOD VI STRP: ORAL_STRIP | Status: DC

## 2014-08-21 NOTE — Telephone Encounter (Signed)
Notified pt monitor w/supplies sent to [facility]...Nicholas Burton

## 2014-11-05 ENCOUNTER — Telehealth: Payer: Self-pay | Admitting: Oncology

## 2014-11-05 NOTE — Telephone Encounter (Signed)
Lft msg for pt confirming MD visit r/s per provider schedule, mailed schedule to pt.... KJ

## 2014-11-22 ENCOUNTER — Other Ambulatory Visit: Payer: Self-pay | Admitting: Internal Medicine

## 2014-12-09 ENCOUNTER — Other Ambulatory Visit: Payer: Self-pay | Admitting: Internal Medicine

## 2014-12-11 DIAGNOSIS — Z961 Presence of intraocular lens: Secondary | ICD-10-CM | POA: Diagnosis not present

## 2014-12-11 DIAGNOSIS — E109 Type 1 diabetes mellitus without complications: Secondary | ICD-10-CM | POA: Diagnosis not present

## 2014-12-11 DIAGNOSIS — H3530 Unspecified macular degeneration: Secondary | ICD-10-CM | POA: Diagnosis not present

## 2014-12-15 ENCOUNTER — Ambulatory Visit: Payer: Medicare Other | Admitting: Internal Medicine

## 2014-12-23 ENCOUNTER — Other Ambulatory Visit: Payer: Self-pay | Admitting: Internal Medicine

## 2014-12-23 DIAGNOSIS — H43813 Vitreous degeneration, bilateral: Secondary | ICD-10-CM | POA: Diagnosis not present

## 2014-12-23 DIAGNOSIS — H3531 Nonexudative age-related macular degeneration: Secondary | ICD-10-CM | POA: Diagnosis not present

## 2014-12-23 LAB — HM DIABETES EYE EXAM

## 2014-12-24 ENCOUNTER — Other Ambulatory Visit (INDEPENDENT_AMBULATORY_CARE_PROVIDER_SITE_OTHER): Payer: Medicare Other

## 2014-12-24 ENCOUNTER — Ambulatory Visit (INDEPENDENT_AMBULATORY_CARE_PROVIDER_SITE_OTHER): Payer: Medicare Other | Admitting: Internal Medicine

## 2014-12-24 VITALS — BP 140/76 | HR 70 | Temp 98.7°F | Resp 16 | Ht 68.5 in | Wt 208.0 lb

## 2014-12-24 DIAGNOSIS — E118 Type 2 diabetes mellitus with unspecified complications: Secondary | ICD-10-CM

## 2014-12-24 DIAGNOSIS — I10 Essential (primary) hypertension: Secondary | ICD-10-CM | POA: Diagnosis not present

## 2014-12-24 DIAGNOSIS — E785 Hyperlipidemia, unspecified: Secondary | ICD-10-CM | POA: Diagnosis not present

## 2014-12-24 LAB — HEMOGLOBIN A1C: HEMOGLOBIN A1C: 6.5 % (ref 4.6–6.5)

## 2014-12-24 LAB — BASIC METABOLIC PANEL
BUN: 14 mg/dL (ref 6–23)
CHLORIDE: 107 meq/L (ref 96–112)
CO2: 30 mEq/L (ref 19–32)
Calcium: 9.1 mg/dL (ref 8.4–10.5)
Creatinine, Ser: 1.37 mg/dL (ref 0.40–1.50)
GFR: 52.49 mL/min — AB (ref >60.00)
GLUCOSE: 73 mg/dL (ref 70–99)
POTASSIUM: 4.7 meq/L (ref 3.5–5.1)
SODIUM: 141 meq/L (ref 135–145)

## 2014-12-24 MED ORDER — ATORVASTATIN CALCIUM 10 MG PO TABS: 10.0000 mg | ORAL_TABLET | Freq: Every day | ORAL | Status: DC

## 2014-12-24 NOTE — Progress Notes (Signed)
Pre visit review using our clinic review tool, if applicable. No additional management support is needed unless otherwise documented below in the visit note. 

## 2014-12-24 NOTE — Patient Instructions (Signed)

## 2014-12-24 NOTE — Progress Notes (Signed)
Subjective:    Patient ID: Nicholas Burton, male    DOB: [birth year], 81 y.o.   MRN: [id number]  Diabetes Pertinent negatives for hypoglycemia include no dizziness or headaches. Associated symptoms include weakness. Pertinent negatives for diabetes include no chest pain and no fatigue.  Hyperlipidemia This is a chronic problem. The current episode started more than 1 year ago. The problem is controlled. Recent lipid tests were reviewed and are normal. Exacerbating diseases include diabetes and obesity. He has no history of chronic renal disease, hypothyroidism, liver disease or nephrotic syndrome. There are no known factors aggravating his hyperlipidemia. Pertinent negatives include no chest pain, focal sensory loss, focal weakness, leg pain, myalgias or shortness of breath. (He complains of diffuse muscle weakness) Current antihyperlipidemic treatment includes statins and bile acid squestrants. The current treatment provides significant improvement of lipids. There are no compliance problems.       Review of Systems  Constitutional: Negative for fever, chills, diaphoresis, appetite change and fatigue.  HENT: Negative.   Eyes: Negative.   Respiratory: Negative.  Negative for cough, choking, chest tightness, shortness of breath and stridor.   Cardiovascular: Negative.  Negative for chest pain, palpitations and leg swelling.  Gastrointestinal: Negative.  Negative for nausea, vomiting, abdominal pain, diarrhea and constipation.  Endocrine: Negative.   Genitourinary: Negative.  Negative for difficulty urinating.  Musculoskeletal: Negative.  Negative for myalgias, back pain and arthralgias.  Skin: Negative.  Negative for rash.  Allergic/Immunologic: Negative.   Neurological: Positive for weakness. Negative for dizziness, focal weakness, light-headedness, numbness and headaches.  Hematological: Negative.  Negative for adenopathy. Does not bruise/bleed easily.  Psychiatric/Behavioral: Negative.         Objective:   Physical Exam  Constitutional: He is oriented to person, place, and time. He appears well-developed and well-nourished. No distress.  HENT:  Head: Normocephalic and atraumatic.  Mouth/Throat: No oropharyngeal exudate.  Eyes: Conjunctivae are normal. Right eye exhibits no discharge. Left eye exhibits no discharge. No scleral icterus.  Neck: Normal range of motion. Neck supple. No JVD present. No tracheal deviation present. No thyromegaly present.  Cardiovascular: Normal rate, regular rhythm, normal heart sounds and intact distal pulses.  Exam reveals no gallop and no friction rub.   No murmur heard. Pulmonary/Chest: Effort normal and breath sounds normal. No stridor. No respiratory distress. He has no wheezes. He has no rales. He exhibits no tenderness.  Abdominal: Soft. Bowel sounds are normal. He exhibits no distension and no mass. There is no tenderness. There is no rebound and no guarding.  Musculoskeletal: Normal range of motion. He exhibits no edema or tenderness.  Lymphadenopathy:    He has no cervical adenopathy.  Neurological: He is oriented to person, place, and time.  Skin: Skin is warm and dry. No rash noted. He is not diaphoretic. No erythema. No pallor.  Vitals reviewed.    Lab Results  Component Value Date   WBC 9.2 04/03/2014   HGB 13.7 04/03/2014   HCT 40.7 04/03/2014   PLT 237.0 04/03/2014   GLUCOSE 97 08/12/2014   CHOL 71 08/12/2014   TRIG 84.0 08/12/2014   HDL 27.70* 08/12/2014   LDLCALC 27 08/12/2014   ALT 19 01/30/2014   AST 20 01/30/2014   NA 138 08/12/2014   K 3.8 08/12/2014   CL 101 08/12/2014   CREATININE 1.1 08/12/2014   BUN 16 08/12/2014   CO2 29 08/12/2014   TSH 2.73 12/02/2013   INR 1.3 ratio* 08/11/2010   HGBA1C 7.6* 08/12/2014  MICROALBUR 1.2 01/07/2010       Assessment & Plan:

## 2014-12-25 ENCOUNTER — Encounter: Payer: Self-pay | Admitting: Internal Medicine

## 2014-12-28 ENCOUNTER — Encounter: Payer: Self-pay | Admitting: Internal Medicine

## 2014-12-28 NOTE — Assessment & Plan Note (Signed)
His blood sugars are very well controlled 

## 2014-12-28 NOTE — Assessment & Plan Note (Signed)
He complains of diffuse muscle weakness so will lower the lipitor dose from 40 mg to 10 mg He dose not report pain so will not check a CK level

## 2014-12-28 NOTE — Assessment & Plan Note (Signed)
His BP is well controlled Lytes and renal function are stable 

## 2015-01-12 ENCOUNTER — Other Ambulatory Visit: Payer: Self-pay | Admitting: Internal Medicine

## 2015-01-23 ENCOUNTER — Telehealth: Payer: Self-pay | Admitting: Oncology

## 2015-01-23 NOTE — Telephone Encounter (Signed)
Due to call moved 6/9 f/u to 2:30pm per FS. S/w patient he is aware. Also confirmed 6/2 lab/xray.

## 2015-01-29 ENCOUNTER — Other Ambulatory Visit (HOSPITAL_BASED_OUTPATIENT_CLINIC_OR_DEPARTMENT_OTHER): Payer: Medicare Other

## 2015-01-29 ENCOUNTER — Ambulatory Visit (HOSPITAL_COMMUNITY)
Admission: RE | Admit: 2015-01-29 | Discharge: 2015-01-29 | Disposition: A | Discharge status: Home / Self Care | Payer: Medicare Other | Source: Ambulatory Visit | Attending: Oncology | Admitting: Oncology

## 2015-01-29 DIAGNOSIS — C439 Malignant melanoma of skin, unspecified: Secondary | ICD-10-CM | POA: Diagnosis not present

## 2015-01-29 DIAGNOSIS — Z85820 Personal history of malignant melanoma of skin: Secondary | ICD-10-CM

## 2015-01-29 DIAGNOSIS — R0609 Other forms of dyspnea: Secondary | ICD-10-CM | POA: Insufficient documentation

## 2015-01-29 DIAGNOSIS — J984 Other disorders of lung: Secondary | ICD-10-CM | POA: Diagnosis not present

## 2015-01-29 LAB — CBC WITH DIFFERENTIAL/PLATELET
BASO%: 0.2 % (ref 0.0–2.0)
Basophils Absolute: 0 10*3/uL (ref 0.0–0.1)
EOS%: 3.7 % (ref 0.0–7.0)
Eosinophils Absolute: 0.3 10*3/uL (ref 0.0–0.5)
HCT: 39.5 % (ref 38.4–49.9)
HEMOGLOBIN: 13.2 g/dL (ref 13.0–17.1)
LYMPH%: 32.2 % (ref 14.0–49.0)
MCH: 28.5 pg (ref 27.2–33.4)
MCHC: 33.4 g/dL (ref 32.0–36.0)
MCV: 85.3 fL (ref 79.3–98.0)
MONO#: 0.7 10*3/uL (ref 0.1–0.9)
MONO%: 8.1 % (ref 0.0–14.0)
NEUT#: 4.7 10*3/uL (ref 1.5–6.5)
NEUT%: 55.8 % (ref 39.0–75.0)
PLATELETS: 190 10*3/uL (ref 140–400)
RBC: 4.63 10*6/uL (ref 4.20–5.82)
RDW: 13.8 % (ref 11.0–14.6)
WBC: 8.5 10*3/uL (ref 4.0–10.3)
lymph#: 2.7 10*3/uL (ref 0.9–3.3)

## 2015-01-29 LAB — COMPREHENSIVE METABOLIC PANEL (CC13)
ALBUMIN: 3.4 g/dL — AB (ref 3.5–5.0)
ALK PHOS: 96 U/L (ref 40–150)
ALT: 19 U/L (ref 0–55)
AST: 24 U/L (ref 5–34)
Anion Gap: 8 mEq/L (ref 3–11)
BUN: 19.5 mg/dL (ref 7.0–26.0)
CHLORIDE: 106 meq/L (ref 98–109)
CO2: 28 meq/L (ref 22–29)
CREATININE: 1 mg/dL (ref 0.7–1.3)
Calcium: 9 mg/dL (ref 8.4–10.4)
EGFR: 67 mL/min/{1.73_m2} — ABNORMAL LOW (ref >90)
GLUCOSE: 167 mg/dL — AB (ref 70–140)
POTASSIUM: 4.3 meq/L (ref 3.5–5.1)
SODIUM: 142 meq/L (ref 136–145)
Total Bilirubin: 0.79 mg/dL (ref 0.20–1.20)
Total Protein: 6 g/dL — ABNORMAL LOW (ref 6.4–8.3)

## 2015-02-03 ENCOUNTER — Ambulatory Visit: Payer: Medicare Other | Admitting: Oncology

## 2015-02-05 ENCOUNTER — Ambulatory Visit (HOSPITAL_BASED_OUTPATIENT_CLINIC_OR_DEPARTMENT_OTHER): Payer: Medicare Other | Admitting: Oncology

## 2015-02-05 ENCOUNTER — Telehealth: Payer: Self-pay | Admitting: Oncology

## 2015-02-05 VITALS — BP 133/68 | HR 83 | Temp 98.0°F | Resp 18 | Ht 68.5 in | Wt 205.7 lb

## 2015-02-05 DIAGNOSIS — C439 Malignant melanoma of skin, unspecified: Secondary | ICD-10-CM

## 2015-02-05 DIAGNOSIS — Z85820 Personal history of malignant melanoma of skin: Secondary | ICD-10-CM

## 2015-02-05 NOTE — Progress Notes (Signed)
Hematology and Oncology Follow Up Visit  Nicholas Burton [id number] 08-May-1928 81 y.o. 02/05/2015 2:29 PM Nicholas Burton, MDJones, [name], MD   Principle Diagnosis:  81 year old gentleman with melanoma dating back to 2002. He is status post surgical resection and sentinel lymph node biopsy of the left axilla for a lesion on his left arm. He did have a recurrence in 2005 and in 2007. His most recent recurrence in 2007 revealed 0.54 mm depth of invasion and a Clark level III.   Prior Therapy: He status post repeat excision in 2007 without any disease recurrence since that time.   Current therapy: Observation and surveillance.  Interim History:  [name] presents today for a follow-up visit. Since the last visit, he reports no new complaints. He does report some mild fatigue and tiredness but for the most part asymptomatic. He has not reported any skin lesions or abnormal moles. He continues to work outside and wear hat regularly.  He does not report any headaches blurred vision or double vision. Does not report any alteration of mental status her psychiatric issues. Does not report any fevers or chills or sweats. Does not report any weight loss or appetite changes. Does not report changes in his performance status. Isn't reporting any chest pain shortness of breath cough or hemoptysis. Does not report any nausea or vomiting or abdominal pain. Does not report any change in his bowel habits or urinary symptoms. Does not report any skin rashes or lesions. Is not reporting any lymphadenopathy or petechiae. He does not report any depression or anxiety. Rest of his review of systems unremarkable.   Medications: I have reviewed the patient's current medications.   Current Outpatient Prescriptions  Medication Sig Dispense Refill  . aspirin 81 MG tablet Take 81 mg by mouth daily.      Marland Kitchen atorvastatin (LIPITOR) 10 MG tablet Take 1 tablet (10 mg total) by mouth daily. 90 tablet 3  . Blood Glucose Monitoring  Suppl (FREESTYLE FREEDOM LITE) W/DEVICE KIT Use to check blood sugars daily Dx E11.8 1 each 0  . glucose blood (FREESTYLE LITE) test strip Use to check blood sugars twice a day E11.8 180 each 3  . Insulin Pen Needle (B-D ULTRAFINE III Hubble PEN) 31G X 8 MM MISC use as directed TID 300 each 3  . KOMBIGLYZE XR 2.12-998 MG TB24 TAKE 2 TABLETS DAILY 180 tablet 2  . Lancets (FREESTYLE) lancets Use to check blood sugars twice day Dx E11.8 180 each 3  . LANTUS SOLOSTAR 100 UNIT/ML Solostar Pen INJECT 0.4 ML (40 UNITS TOTAL) INTO THE SKIN DAILY 3 pen 11  . losartan-hydrochlorothiazide (HYZAAR) 100-12.5 MG per tablet TAKE 1 TABLET DAILY 90 tablet 3  . Multiple Vitamins-Minerals (PRESERVISION/LUTEIN) CAPS Take 1 capsule by mouth 2 (two) times daily.      . WELCHOL 3.75 G PACK MIX AND DRINK 1 PACKET DAILY 90 packet 2   No current facility-administered medications for this visit.     Allergies:  Allergies  Allergen Reactions  . Ace Inhibitors     REACTION: cough  . Oxycodone-Acetaminophen   . Penicillins     Past Medical History, Surgical history, Social history, and Family History were reviewed and updated.   Physical Exam: Blood pressure 133/68, pulse 83, temperature 98 F (36.7 C), temperature source Oral, resp. rate 18, height 5' 8.5" (1.74 m), weight 205 lb 11.2 oz (93.305 kg), SpO2 97 %. ECOG: 1 General appearance: alert and cooperative Head: Normocephalic, without obvious abnormality Neck: no  adenopathy and no carotid bruit Lymph nodes: Cervical, supraclavicular, and axillary nodes normal. Heart:regular rate and rhythm, S1, S2 normal, no murmur, click, rub or gallop Lung:chest clear, no wheezing, rales, normal symmetric air entry Abdomin: soft, non-tender, without masses or organomegaly EXT:no erythema, induration, or nodules Skin: No lesions noted. His incision site does not show any evidence of relapse.  Lab Results: Lab Results  Component Value Date   WBC 8.5 01/29/2015    HGB 13.2 01/29/2015   HCT 39.5 01/29/2015   MCV 85.3 01/29/2015   PLT 190 01/29/2015     Chemistry      Component Value Date/Time   NA 142 01/29/2015 0927   NA 141 12/24/2014 1119   K 4.3 01/29/2015 0927   K 4.7 12/24/2014 1119   CL 107 12/24/2014 1119   CO2 28 01/29/2015 0927   CO2 30 12/24/2014 1119   BUN 19.5 01/29/2015 0927   BUN 14 12/24/2014 1119   CREATININE 1.0 01/29/2015 0927   CREATININE 1.37 12/24/2014 1119      Component Value Date/Time   CALCIUM 9.0 01/29/2015 0927   CALCIUM 9.1 12/24/2014 1119   ALKPHOS 96 01/29/2015 0927   ALKPHOS 91 11/29/2012 1040   AST 24 01/29/2015 0927   AST 28 11/29/2012 1040   ALT 19 01/29/2015 0927   ALT 27 11/29/2012 1040   BILITOT 0.79 01/29/2015 0927   BILITOT 1.1 11/29/2012 1040       Radiological Studies:  CLINICAL DATA: Panic follow-up for cutaneous melanoma, nonsmoker, increased dyspnea on exertion  EXAM: CHEST 2 VIEW  COMPARISON: PA and lateral chest x-ray of August 12, 2014  FINDINGS: The lungs are adequately inflated and clear. There is stable pleural thickening at the left lung base anterior laterally. There is no pleural effusion. No pulmonary parenchymal masses or nodules are demonstrated. The heart and pulmonary vascularity are normal. The mediastinum is normal in width. There is stable tortuosity of the descending thoracic aorta. The bony thorax exhibits no acute abnormality.  IMPRESSION: Stable basilar scarring on the left. There is no acute cardiopulmonary abnormality.   Impression and Plan:   81 year old gentleman with the following issues:  1. History of melanoma dating back to 2002. He is status post surgical resection and sentinel lymph node biopsy of the left axilla for a lesion on his left arm. He did have a recurrence in 2005 and in 2007. His most recent recurrence in 2007 revealed 0.54 mm depth of invasion and a Clark level III. He status post repeat excision without any disease  recurrence since that time. He has been followed at [facility] on an annual basis and we will like to resume that locally.  His laboratory data, examination as well as chest x-ray did not suggest any evidence of relapse. The plan is to continue with annual surveillance and repeat laboratory testing, x-rays and examination. He will continue follow up with dermatology as well.  2. Follow-up: Will be in 12 months sooner if needed to.   [facility], MD 6/9/20162:29 PM

## 2015-02-05 NOTE — Telephone Encounter (Signed)
Pt confirmed labs/ov per 06/09 POF, gave pt AVS and Calendar.... KJ °

## 2015-02-11 ENCOUNTER — Ambulatory Visit: Payer: Medicare Other | Admitting: Oncology

## 2015-02-13 ENCOUNTER — Other Ambulatory Visit: Payer: Self-pay

## 2015-02-13 MED ORDER — COLESEVELAM HCL 3.75 G PO PACK: PACK | ORAL | Status: DC

## 2015-03-10 DIAGNOSIS — H53419 Scotoma involving central area, unspecified eye: Secondary | ICD-10-CM | POA: Diagnosis not present

## 2015-04-07 ENCOUNTER — Other Ambulatory Visit: Payer: Self-pay | Admitting: Internal Medicine

## 2015-04-28 ENCOUNTER — Encounter: Payer: Self-pay | Admitting: Internal Medicine

## 2015-04-28 ENCOUNTER — Ambulatory Visit (INDEPENDENT_AMBULATORY_CARE_PROVIDER_SITE_OTHER): Payer: Medicare Other | Admitting: Internal Medicine

## 2015-04-28 ENCOUNTER — Other Ambulatory Visit (INDEPENDENT_AMBULATORY_CARE_PROVIDER_SITE_OTHER): Payer: Medicare Other

## 2015-04-28 VITALS — BP 122/78 | HR 97 | Temp 97.9°F | Resp 16 | Ht 69.0 in | Wt 206.0 lb

## 2015-04-28 DIAGNOSIS — E118 Type 2 diabetes mellitus with unspecified complications: Secondary | ICD-10-CM

## 2015-04-28 DIAGNOSIS — E785 Hyperlipidemia, unspecified: Secondary | ICD-10-CM

## 2015-04-28 DIAGNOSIS — I10 Essential (primary) hypertension: Secondary | ICD-10-CM

## 2015-04-28 DIAGNOSIS — Z23 Encounter for immunization: Secondary | ICD-10-CM | POA: Diagnosis not present

## 2015-04-28 LAB — BASIC METABOLIC PANEL
BUN: 20 mg/dL (ref 6–23)
CO2: 32 mEq/L (ref 19–32)
Calcium: 9 mg/dL (ref 8.4–10.5)
Chloride: 105 mEq/L (ref 96–112)
Creatinine, Ser: 1.1 mg/dL (ref 0.40–1.50)
GFR: 67.57 mL/min (ref >60.00)
Glucose, Bld: 63 mg/dL — ABNORMAL LOW (ref 70–99)
POTASSIUM: 4.1 meq/L (ref 3.5–5.1)
Sodium: 142 mEq/L (ref 135–145)

## 2015-04-28 LAB — LIPID PANEL
CHOLESTEROL: 87 mg/dL (ref 0–200)
HDL: 31 mg/dL — ABNORMAL LOW (ref >39.00)
LDL CALC: 39 mg/dL (ref 0–99)
NonHDL: 55.56
TRIGLYCERIDES: 81 mg/dL (ref 0.0–149.0)
Total CHOL/HDL Ratio: 3
VLDL: 16.2 mg/dL (ref 0.0–40.0)

## 2015-04-28 LAB — URINALYSIS, ROUTINE W REFLEX MICROSCOPIC
BILIRUBIN URINE: NEGATIVE
HGB URINE DIPSTICK: NEGATIVE
KETONES UR: NEGATIVE
LEUKOCYTES UA: NEGATIVE
NITRITE: NEGATIVE
RBC / HPF: NONE SEEN (ref 0–?)
Specific Gravity, Urine: 1.025 (ref 1.000–1.030)
Total Protein, Urine: NEGATIVE
Urine Glucose: NEGATIVE
Urobilinogen, UA: 0.2 (ref 0.0–1.0)
WBC UA: NONE SEEN (ref 0–?)
pH: 5 (ref 5.0–8.0)

## 2015-04-28 LAB — MICROALBUMIN / CREATININE URINE RATIO
CREATININE, U: 157 mg/dL
Microalb Creat Ratio: 0.4 mg/g (ref 0.0–30.0)
Microalb, Ur: <0.7 mg/dL (ref 0.0–1.9)

## 2015-04-28 LAB — HEMOGLOBIN A1C: HEMOGLOBIN A1C: 6.8 % — AB (ref 4.6–6.5)

## 2015-04-28 NOTE — Patient Instructions (Signed)

## 2015-04-28 NOTE — Progress Notes (Signed)
Subjective:  Patient ID: Nicholas Burton, male    DOB: 06-06-1928  Age: 81 y.o. MRN: [id number]  CC: Hypertension; Diabetes; and Hyperlipidemia   HPI WINN MUEHL presents for routine follow-up. He feels well today and offers no complaints.  Outpatient Prescriptions Prior to Visit  Medication Sig Dispense Refill  . aspirin 81 MG tablet Take 81 mg by mouth daily.      Marland Kitchen atorvastatin (LIPITOR) 10 MG tablet Take 1 tablet (10 mg total) by mouth daily. 90 tablet 3  . Blood Glucose Monitoring Suppl (FREESTYLE FREEDOM LITE) W/DEVICE KIT Use to check blood sugars daily Dx E11.8 1 each 0  . Colesevelam HCl (WELCHOL) 3.75 G PACK MIX AND DRINK 1 PACKET DAILY 90 packet 2  . glucose blood (FREESTYLE LITE) test strip Use to check blood sugars twice a day E11.8 180 each 3  . Insulin Pen Needle (B-D ULTRAFINE III [place] PEN) 31G X 8 MM MISC use as directed TID 300 each 3  . KOMBIGLYZE XR 2.12-998 MG TB24 TAKE 2 TABLETS DAILY 180 tablet 2  . Lancets (FREESTYLE) lancets Use to check blood sugars twice day Dx E11.8 180 each 3  . LANTUS SOLOSTAR 100 UNIT/ML Solostar Pen INJECT 0.4 ML (40 UNITS TOTAL) INTO THE SKIN DAILY 3 pen 11  . losartan-hydrochlorothiazide (HYZAAR) 100-12.5 MG per tablet TAKE 1 TABLET DAILY 90 tablet 2  . Multiple Vitamins-Minerals (PRESERVISION/LUTEIN) CAPS Take 1 capsule by mouth 2 (two) times daily.       No facility-administered medications prior to visit.    ROS Review of Systems  Constitutional: Negative.  Negative for fever, chills, diaphoresis, appetite change and fatigue.  HENT: Negative.   Eyes: Negative.   Respiratory: Negative.  Negative for cough, choking, chest tightness, shortness of breath and stridor.   Cardiovascular: Negative.  Negative for chest pain, palpitations and leg swelling.  Gastrointestinal: Negative.  Negative for nausea, vomiting, abdominal pain, diarrhea, constipation and blood in stool.  Endocrine: Negative.  Negative for polydipsia, polyphagia and  polyuria.  Genitourinary: Negative.  Negative for difficulty urinating.  Musculoskeletal: Negative.  Negative for myalgias, back pain and arthralgias.  Skin: Negative.  Negative for rash.  Allergic/Immunologic: Negative.   Neurological: Negative.  Negative for dizziness, tremors, syncope, light-headedness and headaches.  Hematological: Negative.  Negative for adenopathy. Does not bruise/bleed easily.  Psychiatric/Behavioral: Negative.     Objective:  BP 122/78 mmHg  Pulse 97  Temp(Src) 97.9 F (36.6 C) (Oral)  Resp 16  Ht 5\' 9"  (1.753 m)  Wt 206 lb (93.441 kg)  BMI 30.41 kg/m2  SpO2 97%  BP Readings from Last 3 Encounters:  04/28/15 122/78  02/05/15 133/68  12/24/14 140/76    Wt Readings from Last 3 Encounters:  04/28/15 206 lb (93.441 kg)  02/05/15 205 lb 11.2 oz (93.305 kg)  12/24/14 208 lb (94.348 kg)    Physical Exam  Constitutional: He is oriented to person, place, and time. No distress.  HENT:  Mouth/Throat: Oropharynx is clear and moist.  Eyes: Conjunctivae are normal. Right eye exhibits no discharge. Left eye exhibits no discharge. No scleral icterus.  Neck: Normal range of motion. Neck supple. No JVD present. No tracheal deviation present. No thyromegaly present.  Cardiovascular: Normal rate, regular rhythm, normal heart sounds and intact distal pulses.  Exam reveals no gallop and no friction rub.   No murmur heard. Pulmonary/Chest: Effort normal and breath sounds normal. No stridor. No respiratory distress. He has no wheezes. He has no rales. He  exhibits no tenderness.  Abdominal: Soft. Bowel sounds are normal. He exhibits no distension and no mass. There is no tenderness. There is no rebound and no guarding.  Musculoskeletal: Normal range of motion. He exhibits no edema or tenderness.  Lymphadenopathy:    He has no cervical adenopathy.  Neurological: He is oriented to person, place, and time.  Skin: Skin is warm and dry. No rash noted. He is not diaphoretic.  No erythema. No pallor.  Vitals reviewed.   Lab Results  Component Value Date   WBC 8.5 01/29/2015   HGB 13.2 01/29/2015   HCT 39.5 01/29/2015   PLT 190 01/29/2015   GLUCOSE 167* 01/29/2015   CHOL 71 08/12/2014   TRIG 84.0 08/12/2014   HDL 27.70* 08/12/2014   LDLCALC 27 08/12/2014   ALT 19 01/29/2015   AST 24 01/29/2015   NA 142 01/29/2015   K 4.3 01/29/2015   CL 107 12/24/2014   CREATININE 1.0 01/29/2015   BUN 19.5 01/29/2015   CO2 28 01/29/2015   TSH 2.73 12/02/2013   INR 1.3 ratio* 08/11/2010   HGBA1C 6.5 12/24/2014   MICROALBUR 1.2 01/07/2010    Dg Chest 2 View  01/29/2015   CLINICAL DATA:  Panic follow-up for cutaneous melanoma, nonsmoker, increased dyspnea on exertion  EXAM: CHEST  2 VIEW  COMPARISON:  PA and lateral chest x-ray of August 12, 2014  FINDINGS: The lungs are adequately inflated and clear. There is stable pleural thickening at the left lung base anterior laterally. There is no pleural effusion. No pulmonary parenchymal masses or nodules are demonstrated. The heart and pulmonary vascularity are normal. The mediastinum is normal in width. There is stable tortuosity of the descending thoracic aorta. The bony thorax exhibits no acute abnormality.  IMPRESSION: Stable basilar scarring on the left. There is no acute cardiopulmonary abnormality.   Electronically Signed   By: David  Martinique M.D.   On: 01/29/2015 11:11    Assessment & Plan:   Derran was seen today for hypertension, diabetes and hyperlipidemia.  Diagnoses and all orders for this visit:  Essential hypertension- his blood pressures well controlled, electrolytes and renal function are stable.  Type II diabetes mellitus with manifestations- his blood sugars are well-controlled.  Hyperlipidemia with target LDL less than 100- he has achieved his LDL goal and is doing well on the statin.   I am having Mr. Hosie maintain his aspirin, PRESERVISION/LUTEIN, Insulin Pen Needle, FREESTYLE FREEDOM LITE, glucose  blood, freestyle, KOMBIGLYZE XR, atorvastatin, LANTUS SOLOSTAR, Colesevelam HCl, and losartan-hydrochlorothiazide.  No orders of the defined types were placed in this encounter.     Follow-up: No Follow-up on file.  Scarlette Calico, MD

## 2015-04-28 NOTE — Progress Notes (Signed)
Pre visit review using our clinic review tool, if applicable. No additional management support is needed unless otherwise documented below in the visit note. 

## 2015-06-23 DIAGNOSIS — H43813 Vitreous degeneration, bilateral: Secondary | ICD-10-CM | POA: Diagnosis not present

## 2015-06-23 DIAGNOSIS — H353134 Nonexudative age-related macular degeneration, bilateral, advanced atrophic with subfoveal involvement: Secondary | ICD-10-CM | POA: Diagnosis not present

## 2015-06-23 DIAGNOSIS — H53412 Scotoma involving central area, left eye: Secondary | ICD-10-CM | POA: Diagnosis not present

## 2015-07-31 ENCOUNTER — Other Ambulatory Visit: Payer: Self-pay | Admitting: Internal Medicine

## 2015-08-05 DIAGNOSIS — Z85828 Personal history of other malignant neoplasm of skin: Secondary | ICD-10-CM | POA: Diagnosis not present

## 2015-08-05 DIAGNOSIS — C44222 Squamous cell carcinoma of skin of right ear and external auricular canal: Secondary | ICD-10-CM | POA: Diagnosis not present

## 2015-08-05 DIAGNOSIS — D049 Carcinoma in situ of skin, unspecified: Secondary | ICD-10-CM | POA: Diagnosis not present

## 2015-08-05 DIAGNOSIS — L821 Other seborrheic keratosis: Secondary | ICD-10-CM | POA: Diagnosis not present

## 2015-08-05 DIAGNOSIS — C4442 Squamous cell carcinoma of skin of scalp and neck: Secondary | ICD-10-CM | POA: Diagnosis not present

## 2015-08-05 DIAGNOSIS — D1801 Hemangioma of skin and subcutaneous tissue: Secondary | ICD-10-CM | POA: Diagnosis not present

## 2015-08-05 DIAGNOSIS — D485 Neoplasm of uncertain behavior of skin: Secondary | ICD-10-CM | POA: Diagnosis not present

## 2015-08-18 ENCOUNTER — Other Ambulatory Visit: Payer: Self-pay | Admitting: Internal Medicine

## 2015-09-01 ENCOUNTER — Encounter: Payer: Self-pay | Admitting: Internal Medicine

## 2015-09-01 ENCOUNTER — Other Ambulatory Visit (INDEPENDENT_AMBULATORY_CARE_PROVIDER_SITE_OTHER): Payer: Medicare Other

## 2015-09-01 ENCOUNTER — Ambulatory Visit (INDEPENDENT_AMBULATORY_CARE_PROVIDER_SITE_OTHER): Payer: Medicare Other | Admitting: Internal Medicine

## 2015-09-01 VITALS — BP 106/60 | HR 75 | Temp 98.0°F | Resp 16 | Ht 69.0 in | Wt 209.0 lb

## 2015-09-01 DIAGNOSIS — I10 Essential (primary) hypertension: Secondary | ICD-10-CM

## 2015-09-01 DIAGNOSIS — E785 Hyperlipidemia, unspecified: Secondary | ICD-10-CM

## 2015-09-01 DIAGNOSIS — E118 Type 2 diabetes mellitus with unspecified complications: Secondary | ICD-10-CM | POA: Diagnosis not present

## 2015-09-01 DIAGNOSIS — Z794 Long term (current) use of insulin: Secondary | ICD-10-CM | POA: Diagnosis not present

## 2015-09-01 LAB — BASIC METABOLIC PANEL
BUN: 21 mg/dL (ref 6–23)
CHLORIDE: 105 meq/L (ref 96–112)
CO2: 31 mEq/L (ref 19–32)
CREATININE: 1.16 mg/dL (ref 0.40–1.50)
Calcium: 9.3 mg/dL (ref 8.4–10.5)
GFR: 63.5 mL/min (ref >60.00)
Glucose, Bld: 79 mg/dL (ref 70–99)
Potassium: 3.9 mEq/L (ref 3.5–5.1)
Sodium: 142 mEq/L (ref 135–145)

## 2015-09-01 LAB — HEMOGLOBIN A1C: Hgb A1c MFr Bld: 6.9 % — ABNORMAL HIGH (ref 4.6–6.5)

## 2015-09-01 MED ORDER — COLESEVELAM HCL 3.75 G PO PACK: PACK | ORAL | Status: DC

## 2015-09-01 MED ORDER — INSULIN PEN NEEDLE 31G X 8 MM MISC: Status: DC

## 2015-09-01 MED ORDER — LOSARTAN POTASSIUM-HCTZ 100-12.5 MG PO TABS: 1.0000 | ORAL_TABLET | Freq: Every day | ORAL | Status: DC

## 2015-09-01 MED ORDER — ATORVASTATIN CALCIUM 10 MG PO TABS: 10.0000 mg | ORAL_TABLET | Freq: Every day | ORAL | Status: DC

## 2015-09-01 NOTE — Patient Instructions (Signed)

## 2015-09-01 NOTE — Progress Notes (Signed)
Subjective:  Patient ID: Nicholas Burton, male    DOB: 1928/02/12  Age: 82 y.o. MRN: [id number]  CC: Hypertension and Diabetes   HPI [name] presents for f/up - he feels well and offers no complaints.  Outpatient Prescriptions Prior to Visit  Medication Sig Dispense Refill  . aspirin 81 MG tablet Take 81 mg by mouth daily.      . Blood Glucose Monitoring Suppl (FREESTYLE FREEDOM LITE) W/DEVICE KIT Use to check blood sugars daily Dx E11.8 1 each 0  . FREESTYLE LITE test strip USE TO CHECK BLOOD SUGAR TWICE A DAY 200 each 2  . KOMBIGLYZE XR 2.12-998 MG TB24 TAKE 2 TABLETS DAILY 180 tablet 1  . Lancets (FREESTYLE) lancets Use to check blood sugars twice day Dx E11.8 180 each 3  . LANTUS SOLOSTAR 100 UNIT/ML Solostar Pen INJECT 0.4 ML (40 UNITS TOTAL) INTO THE SKIN DAILY 3 pen 11  . Multiple Vitamins-Minerals (PRESERVISION/LUTEIN) CAPS Take 1 capsule by mouth 2 (two) times daily.      Marland Kitchen atorvastatin (LIPITOR) 10 MG tablet Take 1 tablet (10 mg total) by mouth daily. 90 tablet 3  . Colesevelam HCl (WELCHOL) 3.75 G PACK MIX AND DRINK 1 PACKET DAILY 90 packet 2  . Insulin Pen Needle (B-D ULTRAFINE III [place] PEN) 31G X 8 MM MISC use as directed TID 300 each 3  . losartan-hydrochlorothiazide (HYZAAR) 100-12.5 MG per tablet TAKE 1 TABLET DAILY 90 tablet 2   No facility-administered medications prior to visit.    ROS Review of Systems  Constitutional: Negative.  Negative for fever, chills, diaphoresis, appetite change and fatigue.  HENT: Negative.   Eyes: Negative.   Respiratory: Negative.  Negative for cough, choking, chest tightness, shortness of breath and stridor.   Cardiovascular: Negative.  Negative for chest pain, palpitations and leg swelling.  Gastrointestinal: Negative.  Negative for nausea, vomiting, abdominal pain, diarrhea and constipation.  Endocrine: Negative.  Negative for polydipsia, polyphagia and polyuria.  Genitourinary: Negative.   Musculoskeletal: Negative.     Skin: Negative.  Negative for pallor and rash.  Allergic/Immunologic: Negative.   Neurological: Negative.   Hematological: Negative.  Negative for adenopathy. Does not bruise/bleed easily.  Psychiatric/Behavioral: Negative.     Objective:  BP 106/60 mmHg  Pulse 75  Temp(Src) 98 F (36.7 C) (Oral)  Resp 16  Ht '5\' 9"'$  (1.753 m)  Wt 209 lb (94.802 kg)  BMI 30.85 kg/m2  SpO2 95%  BP Readings from Last 3 Encounters:  09/01/15 106/60  04/28/15 122/78  02/05/15 133/68    Wt Readings from Last 3 Encounters:  09/01/15 209 lb (94.802 kg)  04/28/15 206 lb (93.441 kg)  02/05/15 205 lb 11.2 oz (93.305 kg)    Physical Exam  Constitutional: He is oriented to person, place, and time. No distress.  HENT:  Head: Normocephalic and atraumatic.  Mouth/Throat: Oropharynx is clear and moist. No oropharyngeal exudate.  Eyes: Conjunctivae are normal. Right eye exhibits no discharge. Left eye exhibits no discharge. No scleral icterus.  Neck: Normal range of motion. Neck supple. No JVD present. No tracheal deviation present. No thyromegaly present.  Cardiovascular: Normal rate, regular rhythm, normal heart sounds and intact distal pulses.  Exam reveals no gallop and no friction rub.   No murmur heard. Pulmonary/Chest: Effort normal and breath sounds normal. No stridor. No respiratory distress. He has no wheezes. He has no rales. He exhibits no tenderness.  Abdominal: Soft. Bowel sounds are normal. He exhibits no distension and no  mass. There is no tenderness. There is no rebound and no guarding.  Musculoskeletal: Normal range of motion. He exhibits no edema or tenderness.  Lymphadenopathy:    He has no cervical adenopathy.  Neurological: He is oriented to person, place, and time.  Skin: Skin is warm and dry. No rash noted. He is not diaphoretic. No erythema. No pallor.  Psychiatric: He has a normal mood and affect. His behavior is normal. Judgment and thought content normal.  Vitals  reviewed.   Lab Results  Component Value Date   WBC 8.5 01/29/2015   HGB 13.2 01/29/2015   HCT 39.5 01/29/2015   PLT 190 01/29/2015   GLUCOSE 79 09/01/2015   CHOL 87 04/28/2015   TRIG 81.0 04/28/2015   HDL 31.00* 04/28/2015   LDLCALC 39 04/28/2015   ALT 19 01/29/2015   AST 24 01/29/2015   NA 142 09/01/2015   K 3.9 09/01/2015   CL 105 09/01/2015   CREATININE 1.16 09/01/2015   BUN 21 09/01/2015   CO2 31 09/01/2015   TSH 2.73 12/02/2013   INR 1.3 ratio* 08/11/2010   HGBA1C 6.9* 09/01/2015   MICROALBUR <0.7 04/28/2015    Dg Chest 2 View  01/29/2015  CLINICAL DATA:  Panic follow-up for cutaneous melanoma, nonsmoker, increased dyspnea on exertion EXAM: CHEST  2 VIEW COMPARISON:  PA and lateral chest x-ray of August 12, 2014 FINDINGS: The lungs are adequately inflated and clear. There is stable pleural thickening at the left lung base anterior laterally. There is no pleural effusion. No pulmonary parenchymal masses or nodules are demonstrated. The heart and pulmonary vascularity are normal. The mediastinum is normal in width. There is stable tortuosity of the descending thoracic aorta. The bony thorax exhibits no acute abnormality. IMPRESSION: Stable basilar scarring on the left. There is no acute cardiopulmonary abnormality. Electronically Signed   By: David  Martinique M.D.   On: 01/29/2015 11:11    Assessment & Plan:   Abdirahman was seen today for hypertension and diabetes.  Diagnoses and all orders for this visit:  Type 2 diabetes mellitus with complication, unspecified long term insulin use status ([place])- his blood sugars are well controlled, will cont current therapy -     atorvastatin (LIPITOR) 10 MG tablet; Take 1 tablet (10 mg total) by mouth daily. -     Hemoglobin A1c; Future -     Basic metabolic panel; Future  Hyperlipidemia with target LDL less than 100 -     atorvastatin (LIPITOR) 10 MG tablet; Take 1 tablet (10 mg total) by mouth daily.  Type 2 diabetes mellitus with  complication, with long-term current use of insulin ([facility]) -     Insulin Pen Needle (B-D ULTRAFINE III [place] PEN) 31G X 8 MM MISC; USe to check blood sugar up to three times daily. DX E11.8  Essential hypertension- his BP is well controlled, lytes and renal function are stable -     Basic metabolic panel; Future  Other orders -     losartan-hydrochlorothiazide (HYZAAR) 100-12.5 MG tablet; Take 1 tablet by mouth daily. -     Colesevelam HCl (WELCHOL) 3.75 g PACK; MIX AND DRINK 1 PACKET DAILY   I have changed Mr. Noreen's Insulin Pen Needle and losartan-hydrochlorothiazide. I am also having him maintain his aspirin, PRESERVISION/LUTEIN, FREESTYLE FREEDOM LITE, freestyle, LANTUS SOLOSTAR, FREESTYLE LITE, KOMBIGLYZE XR, atorvastatin, and Colesevelam HCl.  Meds ordered this encounter  Medications  . atorvastatin (LIPITOR) 10 MG tablet    Sig: Take 1 tablet (10 mg total)  by mouth daily.    Dispense:  90 tablet    Refill:  3  . Insulin Pen Needle (B-D ULTRAFINE III Ing PEN) 31G X 8 MM MISC    Sig: USe to check blood sugar up to three times daily. DX E11.8    Dispense:  300 each    Refill:  3  . losartan-hydrochlorothiazide (HYZAAR) 100-12.5 MG tablet    Sig: Take 1 tablet by mouth daily.    Dispense:  90 tablet    Refill:  2  . Colesevelam HCl (WELCHOL) 3.75 g PACK    Sig: MIX AND DRINK 1 PACKET DAILY    Dispense:  90 packet    Refill:  2     Follow-up: Return in about 4 months (around 12/30/2015).  Scarlette Calico, MD

## 2015-09-01 NOTE — Progress Notes (Signed)
Pre visit review using our clinic review tool, if applicable. No additional management support is needed unless otherwise documented below in the visit note. 

## 2015-09-21 DIAGNOSIS — C44222 Squamous cell carcinoma of skin of right ear and external auricular canal: Secondary | ICD-10-CM | POA: Diagnosis not present

## 2015-10-05 DIAGNOSIS — C4442 Squamous cell carcinoma of skin of scalp and neck: Secondary | ICD-10-CM | POA: Diagnosis not present

## 2015-10-26 DIAGNOSIS — D044 Carcinoma in situ of skin of scalp and neck: Secondary | ICD-10-CM | POA: Diagnosis not present

## 2015-12-23 LAB — HM DIABETES EYE EXAM

## 2015-12-24 DIAGNOSIS — H353231 Exudative age-related macular degeneration, bilateral, with active choroidal neovascularization: Secondary | ICD-10-CM | POA: Diagnosis not present

## 2015-12-27 ENCOUNTER — Telehealth: Payer: Self-pay | Admitting: Oncology

## 2015-12-27 NOTE — Telephone Encounter (Signed)
lvm for pt regarding to 6.16 appt moved to 6.20  due to MD call day...mailed pt appt sched and letter

## 2015-12-29 ENCOUNTER — Telehealth: Payer: Self-pay | Admitting: Oncology

## 2015-12-29 NOTE — Telephone Encounter (Signed)
returned call and s.w. pt wife and confirmed appts....pt also aware of xray

## 2015-12-30 ENCOUNTER — Ambulatory Visit: Payer: Medicare Other | Admitting: Internal Medicine

## 2015-12-31 ENCOUNTER — Other Ambulatory Visit (INDEPENDENT_AMBULATORY_CARE_PROVIDER_SITE_OTHER): Payer: Medicare Other

## 2015-12-31 ENCOUNTER — Ambulatory Visit (INDEPENDENT_AMBULATORY_CARE_PROVIDER_SITE_OTHER): Payer: Medicare Other | Admitting: Internal Medicine

## 2015-12-31 ENCOUNTER — Encounter: Payer: Self-pay | Admitting: Internal Medicine

## 2015-12-31 VITALS — BP 138/80 | HR 75 | Temp 97.6°F | Resp 16 | Ht 69.0 in | Wt 206.0 lb

## 2015-12-31 DIAGNOSIS — E538 Deficiency of other specified B group vitamins: Secondary | ICD-10-CM

## 2015-12-31 DIAGNOSIS — I10 Essential (primary) hypertension: Secondary | ICD-10-CM | POA: Diagnosis not present

## 2015-12-31 DIAGNOSIS — E118 Type 2 diabetes mellitus with unspecified complications: Secondary | ICD-10-CM

## 2015-12-31 DIAGNOSIS — E785 Hyperlipidemia, unspecified: Secondary | ICD-10-CM

## 2015-12-31 LAB — BASIC METABOLIC PANEL
BUN: 24 mg/dL — ABNORMAL HIGH (ref 6–23)
CO2: 28 mEq/L (ref 19–32)
Calcium: 9.3 mg/dL (ref 8.4–10.5)
Chloride: 104 mEq/L (ref 96–112)
Creatinine, Ser: 1.35 mg/dL (ref 0.40–1.50)
GFR: 53.26 mL/min — AB (ref >60.00)
Glucose, Bld: 79 mg/dL (ref 70–99)
Potassium: 4 mEq/L (ref 3.5–5.1)
SODIUM: 141 meq/L (ref 135–145)

## 2015-12-31 LAB — LIPID PANEL
CHOL/HDL RATIO: 3
Cholesterol: 96 mg/dL (ref 0–200)
HDL: 33.9 mg/dL — AB (ref >39.00)
LDL CALC: 44 mg/dL (ref 0–99)
NONHDL: 62.5
Triglycerides: 92 mg/dL (ref 0.0–149.0)
VLDL: 18.4 mg/dL (ref 0.0–40.0)

## 2015-12-31 LAB — TSH: TSH: 2.84 u[IU]/mL (ref 0.35–4.50)

## 2015-12-31 LAB — HEMOGLOBIN A1C: HEMOGLOBIN A1C: 7 % — AB (ref 4.6–6.5)

## 2015-12-31 LAB — VITAMIN B12: Vitamin B-12: >1500 pg/mL — ABNORMAL HIGH (ref 211–911)

## 2015-12-31 LAB — FOLATE: FOLATE: 18.9 ng/mL (ref >5.9)

## 2015-12-31 NOTE — Patient Instructions (Signed)

## 2015-12-31 NOTE — Progress Notes (Signed)
Pre visit review using our clinic review tool, if applicable. No additional management support is needed unless otherwise documented below in the visit note. 

## 2016-01-02 ENCOUNTER — Encounter: Payer: Self-pay | Admitting: Internal Medicine

## 2016-01-02 NOTE — Progress Notes (Signed)
Subjective:  Patient ID: Nicholas Burton, male    DOB: 05-Jun-1928  Age: 82 y.o. MRN: [id number]  CC: Hypertension and Diabetes   HPI Nicholas Burton presents for follow-up on hypertension and diabetes. He tells me his blood sugars have been well controlled with a combination of Kombiglyze XR, WelChol, and once daily basal insulin. His blood pressure has been well controlled with the combination of losartan and hydrochlorothiazide. He is doing well on atorvastatin for control of his LDL.  Outpatient Prescriptions Prior to Visit  Medication Sig Dispense Refill  . aspirin 81 MG tablet Take 81 mg by mouth daily.      Marland Kitchen atorvastatin (LIPITOR) 10 MG tablet Take 1 tablet (10 mg total) by mouth daily. 90 tablet 3  . Blood Glucose Monitoring Suppl (FREESTYLE FREEDOM LITE) W/DEVICE KIT Use to check blood sugars daily Dx E11.8 1 each 0  . Colesevelam HCl (WELCHOL) 3.75 g PACK MIX AND DRINK 1 PACKET DAILY 90 packet 2  . FREESTYLE LITE test strip USE TO CHECK BLOOD SUGAR TWICE A DAY 200 each 2  . Insulin Pen Needle (B-D ULTRAFINE III Needles PEN) 31G X 8 MM MISC USe to check blood sugar up to three times daily. DX E11.8 300 each 3  . KOMBIGLYZE XR 2.12-998 MG TB24 TAKE 2 TABLETS DAILY 180 tablet 1  . Lancets (FREESTYLE) lancets Use to check blood sugars twice day Dx E11.8 180 each 3  . LANTUS SOLOSTAR 100 UNIT/ML Solostar Pen INJECT 0.4 ML (40 UNITS TOTAL) INTO THE SKIN DAILY 3 pen 11  . losartan-hydrochlorothiazide (HYZAAR) 100-12.5 MG tablet Take 1 tablet by mouth daily. 90 tablet 2  . Multiple Vitamins-Minerals (PRESERVISION/LUTEIN) CAPS Take 1 capsule by mouth 2 (two) times daily.       No facility-administered medications prior to visit.    ROS Review of Systems  Constitutional: Negative.   HENT: Negative.   Eyes: Negative.   Respiratory: Negative.  Negative for cough, choking, chest tightness, shortness of breath and stridor.   Cardiovascular: Negative.  Negative for chest pain, palpitations and  leg swelling.  Gastrointestinal: Negative.  Negative for nausea, vomiting, abdominal pain, diarrhea and constipation.  Endocrine: Negative.  Negative for polydipsia, polyphagia and polyuria.  Genitourinary: Negative.  Negative for difficulty urinating.  Musculoskeletal: Negative.  Negative for myalgias, back pain, arthralgias and neck pain.  Skin: Negative.  Negative for color change and rash.  Allergic/Immunologic: Negative.   Neurological: Negative.  Negative for dizziness, tremors, weakness, light-headedness, numbness and headaches.  Hematological: Negative.  Negative for adenopathy. Does not bruise/bleed easily.  Psychiatric/Behavioral: Negative.     Objective:  BP 138/80 mmHg  Pulse 75  Temp(Src) 97.6 F (36.4 C) (Oral)  Ht '5\' 9"'$  (1.753 m)  Wt 206 lb (93.441 kg)  BMI 30.41 kg/m2  SpO2 96%  BP Readings from Last 3 Encounters:  12/31/15 138/80  09/01/15 106/60  04/28/15 122/78    Wt Readings from Last 3 Encounters:  12/31/15 206 lb (93.441 kg)  09/01/15 209 lb (94.802 kg)  04/28/15 206 lb (93.441 kg)    Physical Exam  Constitutional: He is oriented to person, place, and time. No distress.  HENT:  Mouth/Throat: Oropharynx is clear and moist. No oropharyngeal exudate.  Eyes: Conjunctivae are normal. Right eye exhibits no discharge. Left eye exhibits no discharge. No scleral icterus.  Neck: Normal range of motion. Neck supple. No JVD present. No tracheal deviation present. No thyromegaly present.  Cardiovascular: Normal rate, regular rhythm, normal heart sounds  and intact distal pulses.  Exam reveals no gallop and no friction rub.   No murmur heard. Pulmonary/Chest: Effort normal and breath sounds normal. No stridor. No respiratory distress. He has no wheezes. He has no rales. He exhibits no tenderness.  Abdominal: Soft. Bowel sounds are normal. He exhibits no distension and no mass. There is no tenderness. There is no rebound and no guarding.  Musculoskeletal: Normal  range of motion. He exhibits edema (trace pitting edema over both lower extremities). He exhibits no tenderness.  Lymphadenopathy:    He has no cervical adenopathy.  Neurological: He is oriented to person, place, and time.  Skin: Skin is warm and dry. No rash noted. He is not diaphoretic. No erythema. No pallor.  Vitals reviewed.   Lab Results  Component Value Date   WBC 8.5 01/29/2015   HGB 13.2 01/29/2015   HCT 39.5 01/29/2015   PLT 190 01/29/2015   GLUCOSE 79 12/31/2015   CHOL 96 12/31/2015   TRIG 92.0 12/31/2015   HDL 33.90* 12/31/2015   LDLCALC 44 12/31/2015   ALT 19 01/29/2015   AST 24 01/29/2015   NA 141 12/31/2015   K 4.0 12/31/2015   CL 104 12/31/2015   CREATININE 1.35 12/31/2015   BUN 24* 12/31/2015   CO2 28 12/31/2015   TSH 2.84 12/31/2015   INR 1.3 ratio* 08/11/2010   HGBA1C 7.0* 12/31/2015   MICROALBUR <0.7 04/28/2015    Dg Chest 2 View  01/29/2015  CLINICAL DATA:  Panic follow-up for cutaneous melanoma, nonsmoker, increased dyspnea on exertion EXAM: CHEST  2 VIEW COMPARISON:  PA and lateral chest x-ray of August 12, 2014 FINDINGS: The lungs are adequately inflated and clear. There is stable pleural thickening at the left lung base anterior laterally. There is no pleural effusion. No pulmonary parenchymal masses or nodules are demonstrated. The heart and pulmonary vascularity are normal. The mediastinum is normal in width. There is stable tortuosity of the descending thoracic aorta. The bony thorax exhibits no acute abnormality. IMPRESSION: Stable basilar scarring on the left. There is no acute cardiopulmonary abnormality. Electronically Signed   By: David  Martinique M.D.   On: 01/29/2015 11:11    Assessment & Plan:   Nicholas Burton was seen today for hypertension and diabetes.  Diagnoses and all orders for this visit:  Essential hypertension- his blood pressure is well-controlled, electrolytes and renal function are stable. -     Basic metabolic panel; Future  Type 2  diabetes mellitus with complication, without long-term current use of insulin ([place])- his A1c is 7.0%, blood sugars are well-controlled on current regimen. -     Hemoglobin A1c; Future  Hyperlipidemia with target LDL less than 100- he is doing well on the statin and is achieved his LDL goal -     Lipid panel; Future -     TSH; Future  B12 deficiency- this has been adequately treated. -     Folate; Future -     Vitamin B12; Future   I am having Mr. Wignall maintain his aspirin, PRESERVISION/LUTEIN, FREESTYLE FREEDOM LITE, freestyle, LANTUS SOLOSTAR, FREESTYLE LITE, KOMBIGLYZE XR, atorvastatin, Insulin Pen Needle, losartan-hydrochlorothiazide, and Colesevelam HCl.  No orders of the defined types were placed in this encounter.     Follow-up: Return in about 4 months (around 05/02/2016).  Scarlette Calico, MD

## 2016-01-08 DIAGNOSIS — H353113 Nonexudative age-related macular degeneration, right eye, advanced atrophic without subfoveal involvement: Secondary | ICD-10-CM | POA: Diagnosis not present

## 2016-01-08 DIAGNOSIS — H353221 Exudative age-related macular degeneration, left eye, with active choroidal neovascularization: Secondary | ICD-10-CM | POA: Diagnosis not present

## 2016-02-05 ENCOUNTER — Ambulatory Visit (HOSPITAL_COMMUNITY)
Admission: RE | Admit: 2016-02-05 | Discharge: 2016-02-05 | Disposition: A | Discharge status: Home / Self Care | Payer: Medicare Other | Source: Ambulatory Visit | Attending: Oncology | Admitting: Oncology

## 2016-02-05 ENCOUNTER — Other Ambulatory Visit (HOSPITAL_BASED_OUTPATIENT_CLINIC_OR_DEPARTMENT_OTHER): Payer: Medicare Other

## 2016-02-05 DIAGNOSIS — C439 Malignant melanoma of skin, unspecified: Secondary | ICD-10-CM

## 2016-02-05 DIAGNOSIS — J984 Other disorders of lung: Secondary | ICD-10-CM | POA: Insufficient documentation

## 2016-02-05 LAB — COMPREHENSIVE METABOLIC PANEL
ALT: 15 U/L (ref 0–55)
AST: 19 U/L (ref 5–34)
Albumin: 3.5 g/dL (ref 3.5–5.0)
Alkaline Phosphatase: 98 U/L (ref 40–150)
Anion Gap: 7 mEq/L (ref 3–11)
BUN: 20.8 mg/dL (ref 7.0–26.0)
CALCIUM: 9 mg/dL (ref 8.4–10.4)
CO2: 26 mEq/L (ref 22–29)
Chloride: 106 mEq/L (ref 98–109)
Creatinine: 1.3 mg/dL (ref 0.7–1.3)
EGFR: 50 mL/min/{1.73_m2} — ABNORMAL LOW (ref >90)
Glucose: 174 mg/dl — ABNORMAL HIGH (ref 70–140)
POTASSIUM: 4.1 meq/L (ref 3.5–5.1)
Sodium: 139 mEq/L (ref 136–145)
Total Bilirubin: 0.57 mg/dL (ref 0.20–1.20)
Total Protein: 6.2 g/dL — ABNORMAL LOW (ref 6.4–8.3)

## 2016-02-05 LAB — CBC WITH DIFFERENTIAL/PLATELET
BASO%: 0.2 % (ref 0.0–2.0)
Basophils Absolute: 0 10*3/uL (ref 0.0–0.1)
EOS%: 3.5 % (ref 0.0–7.0)
Eosinophils Absolute: 0.3 10*3/uL (ref 0.0–0.5)
HCT: 37.1 % — ABNORMAL LOW (ref 38.4–49.9)
HGB: 12.6 g/dL — ABNORMAL LOW (ref 13.0–17.1)
LYMPH%: 31.2 % (ref 14.0–49.0)
MCH: 28.8 pg (ref 27.2–33.4)
MCHC: 34 g/dL (ref 32.0–36.0)
MCV: 84.9 fL (ref 79.3–98.0)
MONO#: 0.9 10*3/uL (ref 0.1–0.9)
MONO%: 9 % (ref 0.0–14.0)
NEUT#: 5.3 10*3/uL (ref 1.5–6.5)
NEUT%: 56.1 % (ref 39.0–75.0)
PLATELETS: 210 10*3/uL (ref 140–400)
RBC: 4.37 10*6/uL (ref 4.20–5.82)
RDW: 13.8 % (ref 11.0–14.6)
WBC: 9.4 10*3/uL (ref 4.0–10.3)
lymph#: 2.9 10*3/uL (ref 0.9–3.3)

## 2016-02-05 LAB — LACTATE DEHYDROGENASE: LDH: 158 U/L (ref 125–245)

## 2016-02-09 DIAGNOSIS — H353221 Exudative age-related macular degeneration, left eye, with active choroidal neovascularization: Secondary | ICD-10-CM | POA: Diagnosis not present

## 2016-02-09 DIAGNOSIS — H3581 Retinal edema: Secondary | ICD-10-CM | POA: Diagnosis not present

## 2016-02-09 DIAGNOSIS — H353113 Nonexudative age-related macular degeneration, right eye, advanced atrophic without subfoveal involvement: Secondary | ICD-10-CM | POA: Diagnosis not present

## 2016-02-09 DIAGNOSIS — H43813 Vitreous degeneration, bilateral: Secondary | ICD-10-CM | POA: Diagnosis not present

## 2016-02-12 ENCOUNTER — Ambulatory Visit: Payer: Medicare Other | Admitting: Oncology

## 2016-02-14 ENCOUNTER — Other Ambulatory Visit: Payer: Self-pay | Admitting: Internal Medicine

## 2016-02-16 ENCOUNTER — Telehealth: Payer: Self-pay | Admitting: Oncology

## 2016-02-16 ENCOUNTER — Ambulatory Visit (HOSPITAL_BASED_OUTPATIENT_CLINIC_OR_DEPARTMENT_OTHER): Payer: Medicare Other | Admitting: Oncology

## 2016-02-16 VITALS — BP 133/67 | HR 74 | Temp 98.8°F | Resp 18 | Ht 69.0 in | Wt 208.9 lb

## 2016-02-16 DIAGNOSIS — Z85820 Personal history of malignant melanoma of skin: Secondary | ICD-10-CM | POA: Diagnosis not present

## 2016-02-16 DIAGNOSIS — C439 Malignant melanoma of skin, unspecified: Secondary | ICD-10-CM

## 2016-02-16 NOTE — Progress Notes (Signed)
Hematology and Oncology Follow Up Visit  Nicholas Burton [id number] 1928/08/08 82 y.o. 02/16/2016 1:20 PM Nicholas Burton, MDJones, [name], MD   Principle Diagnosis:  82 year old gentleman with melanoma dating back to 2002. He is status post surgical resection and sentinel lymph node biopsy of the left axilla for a lesion on his left arm. He did have a recurrence in 2005 and in 2007. His most recent recurrence in 2007 revealed 0.54 mm depth of invasion and a Clark level III.   Prior Therapy: He status post repeat excision in 2007 without any disease recurrence since that time.   Current therapy: Observation and surveillance.  Interim History:  [name] presents today for a follow-up visit. Since the last visit, he reports no recent issues. He continues to be under reasonably good health and good shape without any hospitalization or illnesses. He continues to follow with dermatology without any new skin lesions. He does report some mild fatigue and tiredness but for the most part asymptomatic. He has not reported any skin lesions or abnormal moles. He continues to work outside and wear hat regularly. His appetite is reasonable although he is finding certain foods to eat at times.  He does not report any headaches blurred vision or double vision. Does not report any alteration of mental status her psychiatric issues. Does not report any fevers or chills or sweats. Does not report any weight loss or appetite changes. He does not report any any chest pain shortness of breath cough or hemoptysis. Does not report any nausea or vomiting or abdominal pain. Does not report any change in his bowel habits or urinary symptoms. Does not report any skin rashes or lesions. Is not reporting any lymphadenopathy or petechiae. He does not report any depression or anxiety. Rest of his review of systems unremarkable.   Medications: I have reviewed the patient's current medications.   Current Outpatient Prescriptions   Medication Sig Dispense Refill  . aspirin 81 MG tablet Take 81 mg by mouth daily.      Marland Kitchen atorvastatin (LIPITOR) 10 MG tablet Take 1 tablet (10 mg total) by mouth daily. 90 tablet 3  . Blood Glucose Monitoring Suppl (FREESTYLE FREEDOM LITE) W/DEVICE KIT Use to check blood sugars daily Dx E11.8 1 each 0  . Colesevelam HCl (WELCHOL) 3.75 g PACK MIX AND DRINK 1 PACKET DAILY 90 packet 2  . FREESTYLE LITE test strip USE TO CHECK BLOOD SUGAR TWICE A DAY 200 each 2  . Insulin Pen Needle (B-D ULTRAFINE III [place] PEN) 31G X 8 MM MISC USe to check blood sugar up to three times daily. DX E11.8 300 each 3  . KOMBIGLYZE XR 2.12-998 MG TB24 TAKE 2 TABLETS DAILY 180 tablet 3  . Lancets (FREESTYLE) lancets Use to check blood sugars twice day Dx E11.8 180 each 3  . LANTUS SOLOSTAR 100 UNIT/ML Solostar Pen INJECT 0.4 ML (40 UNITS TOTAL) INTO THE SKIN DAILY 3 pen 11  . losartan-hydrochlorothiazide (HYZAAR) 100-12.5 MG tablet Take 1 tablet by mouth daily. 90 tablet 2  . Multiple Vitamins-Minerals (PRESERVISION/LUTEIN) CAPS Take 1 capsule by mouth 2 (two) times daily.       No current facility-administered medications for this visit.     Allergies:  Allergies  Allergen Reactions  . Ace Inhibitors     REACTION: cough  . Oxycodone-Acetaminophen   . Penicillins     Past Medical History, Surgical history, Social history, and Family History were reviewed and updated.   Physical Exam: Blood  pressure 133/67, pulse 74, temperature 98.8 F (37.1 C), temperature source Oral, resp. rate 18, height '5\' 9"'$  (1.753 m), weight 208 lb 14.4 oz (94.756 kg), SpO2 100 %. ECOG: 1 General appearance: alert and cooperative appeared without distress. Head: Normocephalic, without obvious abnormality no oral ulcers or lesions. Neck: no adenopathy and no carotid bruit Lymph nodes: Cervical, supraclavicular, and axillary nodes normal. Heart:regular rate and rhythm, S1, S2 normal, no murmur, click, rub or gallop Lung:chest  clear, no wheezing, rales, normal symmetric air entry Abdomin: soft, non-tender, without masses or organomegaly EXT:no erythema, induration, or nodules Skin: No suspicious lesions noted.  Lab Results: Lab Results  Component Value Date   WBC 9.4 02/05/2016   HGB 12.6* 02/05/2016   HCT 37.1* 02/05/2016   MCV 84.9 02/05/2016   PLT 210 02/05/2016     Chemistry      Component Value Date/Time   NA 139 02/05/2016 1343   NA 141 12/31/2015 1104   K 4.1 02/05/2016 1343   K 4.0 12/31/2015 1104   CL 104 12/31/2015 1104   CO2 26 02/05/2016 1343   CO2 28 12/31/2015 1104   BUN 20.8 02/05/2016 1343   BUN 24* 12/31/2015 1104   CREATININE 1.3 02/05/2016 1343   CREATININE 1.35 12/31/2015 1104      Component Value Date/Time   CALCIUM 9.0 02/05/2016 1343   CALCIUM 9.3 12/31/2015 1104   ALKPHOS 98 02/05/2016 1343   ALKPHOS 91 11/29/2012 1040   AST 19 02/05/2016 1343   AST 28 11/29/2012 1040   ALT 15 02/05/2016 1343   ALT 27 11/29/2012 1040   BILITOT 0.57 02/05/2016 1343   BILITOT 1.1 11/29/2012 1040      EXAM: CHEST 2 VIEW  COMPARISON: 01/29/2015.  FINDINGS: The cardiac silhouette, mediastinal and hilar contours are within normal limits for age and stable. There is mild tortuosity of the thoracic aorta. Stable basilar scarring changes. No infiltrates, edema or effusions. No worrisome pulmonary lesions. The bony thorax is intact.  IMPRESSION: No acute cardiopulmonary findings no worrisome pulmonary lesions. Stable basilar scarring changes.   Impression and Plan:   82 year old gentleman with the following issues:  1. Melanoma dating back to 2002. He is status post surgical resection and sentinel lymph node biopsy of the left axilla for a lesion on his left arm. He did have a recurrence in 2005 and in 2007. His most recent recurrence in 2007 revealed 0.54 mm depth of invasion and a Clark level III. He status post repeat excision without any disease recurrence since that  time. He has been followed at [facility] on an annual basis and we will like to resume that locally.  His clinical examination, laboratory data and chest x-ray were reviewed today and showed no evidence of recurrent disease. I recommended continued observation and surveillance with annual visits. He continues to follow with dermatology on a regular basis. We'll repeat his chest x-ray annually.  2. Follow-up: Will be in 12 months sooner if needed to.   Zola Button, MD 6/20/20171:20 PM

## 2016-02-16 NOTE — Telephone Encounter (Signed)
lvm for pt regarding to [id number] appt

## 2016-03-08 ENCOUNTER — Other Ambulatory Visit: Payer: Self-pay | Admitting: Internal Medicine

## 2016-03-09 DIAGNOSIS — H43813 Vitreous degeneration, bilateral: Secondary | ICD-10-CM | POA: Diagnosis not present

## 2016-03-09 DIAGNOSIS — H353221 Exudative age-related macular degeneration, left eye, with active choroidal neovascularization: Secondary | ICD-10-CM | POA: Diagnosis not present

## 2016-03-09 DIAGNOSIS — H353113 Nonexudative age-related macular degeneration, right eye, advanced atrophic without subfoveal involvement: Secondary | ICD-10-CM | POA: Diagnosis not present

## 2016-03-21 NOTE — Progress Notes (Signed)
   Subjective:  Patient ID: Nicholas Burton, male    DOB: 1928-08-12  Age: 82 y.o. MRN: [id number]  CC: Other (2 wk f/u / #/ cd)  OLD  HPI Nicholas Burton presents for old  No outpatient prescriptions prior to visit.   No facility-administered medications prior to visit.     ROS Review of Systems  Objective:  BP 116/70   Pulse 96   Resp 16   Ht 5\' 8"  (1.727 m)   Wt 196 lb 4 oz (89 kg)   SpO2 95%   BMI 29.84 kg/m   BP Readings from Last 3 Encounters:  02/16/16 133/67  12/31/15 138/80  09/01/15 106/60    Wt Readings from Last 3 Encounters:  02/16/16 208 lb 14.4 oz (94.8 kg)  12/31/15 206 lb (93.4 kg)  09/01/15 209 lb (94.8 kg)    Physical Exam  Lab Results  Component Value Date   WBC 9.4 02/05/2016   HGB 12.6 (L) 02/05/2016   HCT 37.1 (L) 02/05/2016   PLT 210 02/05/2016   GLUCOSE 174 (H) 02/05/2016   CHOL 96 12/31/2015   TRIG 92.0 12/31/2015   HDL 33.90 (L) 12/31/2015   LDLCALC 44 12/31/2015   ALT 15 02/05/2016   AST 19 02/05/2016   NA 139 02/05/2016   K 4.1 02/05/2016   CL 104 12/31/2015   CREATININE 1.3 02/05/2016   BUN 20.8 02/05/2016   CO2 26 02/05/2016   TSH 2.84 12/31/2015   INR 1.3 ratio (H) 08/11/2010   HGBA1C 7.0 (H) 12/31/2015   MICROALBUR <0.7 04/28/2015    Dg Chest 2 View  Result Date: 02/05/2016 CLINICAL DATA:  Melanoma. EXAM: CHEST  2 VIEW COMPARISON:  01/29/2015. FINDINGS: The cardiac silhouette, mediastinal and hilar contours are within normal limits for age and stable. There is mild tortuosity of the thoracic aorta. Stable basilar scarring changes. No infiltrates, edema or effusions. No worrisome pulmonary lesions. The bony thorax is intact. IMPRESSION: No acute cardiopulmonary findings no worrisome pulmonary lesions. Stable basilar scarring changes. Electronically Signed   By: Marijo Sanes M.D.   On: 02/05/2016 14:08    Assessment & Plan:   There are no diagnoses linked to this encounter. This SmartLink is not available in inpatient  contexts.  No orders of the defined types were placed in this encounter.    Follow-up: No Follow-up on file.  Scarlette Calico, MD

## 2016-04-20 DIAGNOSIS — H353113 Nonexudative age-related macular degeneration, right eye, advanced atrophic without subfoveal involvement: Secondary | ICD-10-CM | POA: Diagnosis not present

## 2016-04-20 DIAGNOSIS — H43813 Vitreous degeneration, bilateral: Secondary | ICD-10-CM | POA: Diagnosis not present

## 2016-04-20 DIAGNOSIS — H353221 Exudative age-related macular degeneration, left eye, with active choroidal neovascularization: Secondary | ICD-10-CM | POA: Diagnosis not present

## 2016-04-27 ENCOUNTER — Other Ambulatory Visit: Payer: Self-pay | Admitting: Internal Medicine

## 2016-05-03 ENCOUNTER — Encounter: Payer: Self-pay | Admitting: Internal Medicine

## 2016-05-03 ENCOUNTER — Other Ambulatory Visit (INDEPENDENT_AMBULATORY_CARE_PROVIDER_SITE_OTHER): Payer: Medicare Other

## 2016-05-03 ENCOUNTER — Ambulatory Visit (INDEPENDENT_AMBULATORY_CARE_PROVIDER_SITE_OTHER): Payer: Medicare Other | Admitting: Internal Medicine

## 2016-05-03 VITALS — BP 124/80 | HR 75 | Temp 98.1°F | Ht 69.0 in | Wt 206.2 lb

## 2016-05-03 DIAGNOSIS — E669 Obesity, unspecified: Secondary | ICD-10-CM | POA: Diagnosis not present

## 2016-05-03 DIAGNOSIS — E538 Deficiency of other specified B group vitamins: Secondary | ICD-10-CM

## 2016-05-03 DIAGNOSIS — E118 Type 2 diabetes mellitus with unspecified complications: Secondary | ICD-10-CM

## 2016-05-03 DIAGNOSIS — Z23 Encounter for immunization: Secondary | ICD-10-CM

## 2016-05-03 LAB — CBC WITH DIFFERENTIAL/PLATELET
BASOS ABS: 0.1 10*3/uL (ref 0.0–0.1)
Basophils Relative: 0.7 % (ref 0.0–3.0)
Eosinophils Absolute: 0.3 10*3/uL (ref 0.0–0.7)
Eosinophils Relative: 2.5 % (ref 0.0–5.0)
HCT: 39.5 % (ref 39.0–52.0)
HEMOGLOBIN: 13.4 g/dL (ref 13.0–17.0)
LYMPHS ABS: 3.2 10*3/uL (ref 0.7–4.0)
Lymphocytes Relative: 29.9 % (ref 12.0–46.0)
MCHC: 34 g/dL (ref 30.0–36.0)
MCV: 85 fl (ref 78.0–100.0)
MONO ABS: 0.9 10*3/uL (ref 0.1–1.0)
MONOS PCT: 8.1 % (ref 3.0–12.0)
NEUTROS PCT: 58.8 % (ref 43.0–77.0)
Neutro Abs: 6.3 10*3/uL (ref 1.4–7.7)
Platelets: 255 10*3/uL (ref 150.0–400.0)
RBC: 4.64 Mil/uL (ref 4.22–5.81)
RDW: 14.5 % (ref 11.5–15.5)
WBC: 10.7 10*3/uL — AB (ref 4.0–10.5)

## 2016-05-03 LAB — URINALYSIS, ROUTINE W REFLEX MICROSCOPIC
BILIRUBIN URINE: NEGATIVE
HGB URINE DIPSTICK: NEGATIVE
KETONES UR: NEGATIVE
LEUKOCYTES UA: NEGATIVE
NITRITE: NEGATIVE
RBC / HPF: NONE SEEN (ref 0–?)
Specific Gravity, Urine: 1.01 (ref 1.000–1.030)
TOTAL PROTEIN, URINE-UPE24: NEGATIVE
URINE GLUCOSE: NEGATIVE
UROBILINOGEN UA: 0.2 (ref 0.0–1.0)
WBC UA: NONE SEEN (ref 0–?)
pH: 5 (ref 5.0–8.0)

## 2016-05-03 LAB — BASIC METABOLIC PANEL
BUN: 22 mg/dL (ref 6–23)
CHLORIDE: 104 meq/L (ref 96–112)
CO2: 30 meq/L (ref 19–32)
Calcium: 9.5 mg/dL (ref 8.4–10.5)
Creatinine, Ser: 1.27 mg/dL (ref 0.40–1.50)
GFR: 57.1 mL/min — ABNORMAL LOW (ref >60.00)
GLUCOSE: 81 mg/dL (ref 70–99)
Potassium: 4.5 mEq/L (ref 3.5–5.1)
SODIUM: 140 meq/L (ref 135–145)

## 2016-05-03 LAB — HEMOGLOBIN A1C: Hgb A1c MFr Bld: 6.7 % — ABNORMAL HIGH (ref 4.6–6.5)

## 2016-05-03 LAB — MICROALBUMIN / CREATININE URINE RATIO
Creatinine,U: 67 mg/dL
MICROALB/CREAT RATIO: 1 mg/g (ref 0.0–30.0)
Microalb, Ur: <0.7 mg/dL (ref 0.0–1.9)

## 2016-05-03 NOTE — Patient Instructions (Signed)

## 2016-05-03 NOTE — Progress Notes (Signed)
Subjective:  Patient ID: Nicholas Burton, male    DOB: [birth year]  Age: 82 y.o. MRN: [id number]  CC: Diabetes   HPI Nicholas Burton presents for follow-up on diabetes. He has consumed a lot of salt recently and complains of indentations in his lower extremities from his socks. He has had no recent episodes of chest pain, palpitations, shortness of breath, edema, or fatigue. He tells me his blood sugars been well controlled he has had no recent visual disturbance, polyuria, polydipsia, or polyphagia.  Outpatient Medications Prior to Visit  Medication Sig Dispense Refill  . aspirin 81 MG tablet Take 81 mg by mouth daily.      Marland Kitchen atorvastatin (LIPITOR) 10 MG tablet Take 1 tablet (10 mg total) by mouth daily. 90 tablet 3  . Blood Glucose Monitoring Suppl (FREESTYLE FREEDOM LITE) W/DEVICE KIT Use to check blood sugars daily Dx E11.8 1 each 0  . Colesevelam HCl (WELCHOL) 3.75 g PACK MIX AND DRINK 1 PACKET DAILY 90 packet 2  . FREESTYLE LITE test strip USE TO CHECK BLOOD SUGAR TWICE A DAY 200 each 2  . Insulin Pen Needle (B-D ULTRAFINE III Josey PEN) 31G X 8 MM MISC USe to check blood sugar up to three times daily. DX E11.8 300 each 3  . KOMBIGLYZE XR 2.12-998 MG TB24 TAKE 2 TABLETS DAILY 180 tablet 3  . Lancets (FREESTYLE) lancets Use to check blood sugars twice day Dx E11.8 180 each 3  . LANTUS SOLOSTAR 100 UNIT/ML Solostar Pen INJECT 40 UNITS UNDER THE SKIN DAILY 45 mL 3  . losartan-hydrochlorothiazide (HYZAAR) 100-12.5 MG tablet Take 1 tablet by mouth daily. 90 tablet 2  . Multiple Vitamins-Minerals (PRESERVISION/LUTEIN) CAPS Take 1 capsule by mouth 2 (two) times daily.       No facility-administered medications prior to visit.     ROS Review of Systems  Constitutional: Negative.  Negative for appetite change, chills, diaphoresis, fatigue and fever.  HENT: Negative.  Negative for trouble swallowing.   Eyes: Negative.  Negative for visual disturbance.  Respiratory: Negative.  Negative for  cough, choking, chest tightness, shortness of breath and wheezing.   Cardiovascular: Positive for leg swelling. Negative for chest pain and palpitations.  Gastrointestinal: Negative.  Negative for abdominal pain, constipation, diarrhea, nausea and vomiting.  Endocrine: Negative.  Negative for polydipsia, polyphagia and polyuria.  Genitourinary: Negative.  Negative for difficulty urinating.  Musculoskeletal: Negative.  Negative for arthralgias, back pain, joint swelling, myalgias and neck pain.  Skin: Negative.  Negative for color change, pallor and rash.  Allergic/Immunologic: Negative.   Neurological: Negative.  Negative for dizziness, tremors, weakness, light-headedness and numbness.  Hematological: Negative.  Negative for adenopathy. Does not bruise/bleed easily.  Psychiatric/Behavioral: Negative.     Objective:  BP 124/80 (BP Location: Left Arm, Patient Position: Sitting, Cuff Size: Normal)   Pulse 75   Temp 98.1 F (36.7 C) (Oral)   Ht 5' 9"  (1.753 m)   Wt 206 lb 4 oz (93.6 kg)   SpO2 97%   BMI 30.46 kg/m   BP Readings from Last 3 Encounters:  05/03/16 124/80  02/16/16 133/67  12/31/15 138/80    Wt Readings from Last 3 Encounters:  05/03/16 206 lb 4 oz (93.6 kg)  02/16/16 208 lb 14.4 oz (94.8 kg)  12/31/15 206 lb (93.4 kg)    Physical Exam  Constitutional: He is oriented to person, place, and time. No distress.  HENT:  Mouth/Throat: Oropharynx is clear and moist. No oropharyngeal exudate.  Eyes: Conjunctivae are normal. Right eye exhibits no discharge. Left eye exhibits no discharge. No scleral icterus.  Neck: Normal range of motion. Neck supple. No JVD present. No tracheal deviation present. No thyromegaly present.  Cardiovascular: Normal rate, regular rhythm, normal heart sounds and intact distal pulses.  Exam reveals no gallop and no friction rub.   No murmur heard. Pulmonary/Chest: Effort normal and breath sounds normal. No stridor. No respiratory distress. He has  no wheezes. He has no rales. He exhibits no tenderness.  Abdominal: Soft. Bowel sounds are normal. He exhibits no distension and no mass. There is no tenderness. There is no rebound and no guarding.  Musculoskeletal: Normal range of motion. He exhibits edema (trace pitting edema in BLE). He exhibits no tenderness or deformity.  Lymphadenopathy:    He has no cervical adenopathy.  Neurological: He is oriented to person, place, and time.  Skin: Skin is warm and dry. No rash noted. He is not diaphoretic. No erythema.  Vitals reviewed.   Lab Results  Component Value Date   WBC 10.7 (H) 05/03/2016   HGB 13.4 05/03/2016   HCT 39.5 05/03/2016   PLT 255.0 05/03/2016   GLUCOSE 81 05/03/2016   CHOL 96 12/31/2015   TRIG 92.0 12/31/2015   HDL 33.90 (L) 12/31/2015   LDLCALC 44 12/31/2015   ALT 15 02/05/2016   AST 19 02/05/2016   NA 140 05/03/2016   K 4.5 05/03/2016   CL 104 05/03/2016   CREATININE 1.27 05/03/2016   BUN 22 05/03/2016   CO2 30 05/03/2016   TSH 2.84 12/31/2015   INR 1.3 ratio (H) 08/11/2010   HGBA1C 6.7 (H) 05/03/2016   MICROALBUR <0.7 05/03/2016    Dg Chest 2 View  Result Date: 02/05/2016 CLINICAL DATA:  Melanoma. EXAM: CHEST  2 VIEW COMPARISON:  01/29/2015. FINDINGS: The cardiac silhouette, mediastinal and hilar contours are within normal limits for age and stable. There is mild tortuosity of the thoracic aorta. Stable basilar scarring changes. No infiltrates, edema or effusions. No worrisome pulmonary lesions. The bony thorax is intact. IMPRESSION: No acute cardiopulmonary findings no worrisome pulmonary lesions. Stable basilar scarring changes. Electronically Signed   By: Nicholas Burton M.D.   On: 02/05/2016 14:08    Assessment & Plan:   [name] was seen today for diabetes.  Diagnoses and all orders for this visit:  Need for prophylactic vaccination and inoculation against influenza -     Flu vaccine HIGH DOSE PF (Fluzone High dose)  B12 deficiency- Improvement noted,  will continue B12 replacement therapy. -     CBC with Differential/Platelet; Future  Type 2 diabetes mellitus with complication, without long-term current use of insulin ([place])- his A1c is 6.7%, blood sugars are well-controlled. Will continue the combination of a basal insulin, DPP 4 inhibitor, and metformin. His trace edema is related to high sodium intake. He agrees to reduce his sodium intake and to elevate his lower extremities. -     Basic metabolic panel; Future -     Hemoglobin A1c; Future -     Urinalysis, Routine w reflex microscopic (not at [facility]); Future -     Microalbumin / creatinine urine ratio; Future  Obesity (BMI 30.0-34.9)- he agrees to work on his lifestyle modifications with decreased caloric intake and more activity to help her lose weight.   I am having Mr. Stegenga maintain his aspirin, PRESERVISION/LUTEIN, FREESTYLE FREEDOM LITE, freestyle, atorvastatin, Insulin Pen Needle, losartan-hydrochlorothiazide, Colesevelam HCl, KOMBIGLYZE XR, LANTUS SOLOSTAR, FREESTYLE LITE, cyanocobalamin, cholecalciferol, and tobramycin.  Meds ordered this encounter  Medications  . cyanocobalamin 1000 MCG tablet    Sig: Take 1,000 mcg by mouth daily.  . cholecalciferol (VITAMIN D) 1000 units tablet    Sig: Take 1,000 Units by mouth daily.  Marland Kitchen tobramycin (TOBREX) 0.3 % ophthalmic solution     Follow-up: Return in about 6 months (around 10/31/2016).  Scarlette Calico, MD

## 2016-05-03 NOTE — Progress Notes (Signed)
Pre visit review using our clinic review tool, if applicable. No additional management support is needed unless otherwise documented below in the visit note. 

## 2016-06-01 ENCOUNTER — Other Ambulatory Visit: Payer: Self-pay | Admitting: Internal Medicine

## 2016-06-15 DIAGNOSIS — H43813 Vitreous degeneration, bilateral: Secondary | ICD-10-CM | POA: Diagnosis not present

## 2016-06-15 DIAGNOSIS — H353221 Exudative age-related macular degeneration, left eye, with active choroidal neovascularization: Secondary | ICD-10-CM | POA: Diagnosis not present

## 2016-06-15 DIAGNOSIS — H353113 Nonexudative age-related macular degeneration, right eye, advanced atrophic without subfoveal involvement: Secondary | ICD-10-CM | POA: Diagnosis not present

## 2016-06-15 LAB — HM DIABETES EYE EXAM

## 2016-06-22 ENCOUNTER — Encounter: Payer: Self-pay | Admitting: Internal Medicine

## 2016-07-07 ENCOUNTER — Other Ambulatory Visit: Payer: Self-pay | Admitting: Internal Medicine

## 2016-07-21 ENCOUNTER — Other Ambulatory Visit: Payer: Self-pay | Admitting: Internal Medicine

## 2016-07-21 DIAGNOSIS — E118 Type 2 diabetes mellitus with unspecified complications: Secondary | ICD-10-CM

## 2016-07-21 DIAGNOSIS — E785 Hyperlipidemia, unspecified: Secondary | ICD-10-CM

## 2016-07-28 DIAGNOSIS — H353221 Exudative age-related macular degeneration, left eye, with active choroidal neovascularization: Secondary | ICD-10-CM | POA: Diagnosis not present

## 2016-08-01 DIAGNOSIS — Z85820 Personal history of malignant melanoma of skin: Secondary | ICD-10-CM | POA: Diagnosis not present

## 2016-08-01 DIAGNOSIS — D225 Melanocytic nevi of trunk: Secondary | ICD-10-CM | POA: Diagnosis not present

## 2016-08-01 DIAGNOSIS — L814 Other melanin hyperpigmentation: Secondary | ICD-10-CM | POA: Diagnosis not present

## 2016-08-01 DIAGNOSIS — Z85828 Personal history of other malignant neoplasm of skin: Secondary | ICD-10-CM | POA: Diagnosis not present

## 2016-08-01 DIAGNOSIS — L821 Other seborrheic keratosis: Secondary | ICD-10-CM | POA: Diagnosis not present

## 2016-08-01 DIAGNOSIS — L570 Actinic keratosis: Secondary | ICD-10-CM | POA: Diagnosis not present

## 2016-08-13 ENCOUNTER — Other Ambulatory Visit: Payer: Self-pay | Admitting: Internal Medicine

## 2016-08-13 DIAGNOSIS — E118 Type 2 diabetes mellitus with unspecified complications: Secondary | ICD-10-CM

## 2016-08-13 DIAGNOSIS — Z794 Long term (current) use of insulin: Principal | ICD-10-CM

## 2016-09-07 DIAGNOSIS — H353221 Exudative age-related macular degeneration, left eye, with active choroidal neovascularization: Secondary | ICD-10-CM | POA: Diagnosis not present

## 2016-09-07 DIAGNOSIS — H43813 Vitreous degeneration, bilateral: Secondary | ICD-10-CM | POA: Diagnosis not present

## 2016-09-07 DIAGNOSIS — H353113 Nonexudative age-related macular degeneration, right eye, advanced atrophic without subfoveal involvement: Secondary | ICD-10-CM | POA: Diagnosis not present

## 2016-10-31 ENCOUNTER — Encounter: Payer: Self-pay | Admitting: Internal Medicine

## 2016-10-31 ENCOUNTER — Other Ambulatory Visit (INDEPENDENT_AMBULATORY_CARE_PROVIDER_SITE_OTHER): Payer: Medicare Other

## 2016-10-31 ENCOUNTER — Ambulatory Visit (INDEPENDENT_AMBULATORY_CARE_PROVIDER_SITE_OTHER): Payer: Medicare Other | Admitting: Internal Medicine

## 2016-10-31 VITALS — BP 116/84 | HR 79 | Temp 97.7°F | Resp 16 | Ht 69.0 in | Wt 207.0 lb

## 2016-10-31 DIAGNOSIS — I10 Essential (primary) hypertension: Secondary | ICD-10-CM

## 2016-10-31 DIAGNOSIS — E118 Type 2 diabetes mellitus with unspecified complications: Secondary | ICD-10-CM

## 2016-10-31 DIAGNOSIS — E538 Deficiency of other specified B group vitamins: Secondary | ICD-10-CM | POA: Diagnosis not present

## 2016-10-31 DIAGNOSIS — E785 Hyperlipidemia, unspecified: Secondary | ICD-10-CM

## 2016-10-31 LAB — HEMOGLOBIN A1C: Hgb A1c MFr Bld: 6.8 % — ABNORMAL HIGH (ref 4.6–6.5)

## 2016-10-31 LAB — CBC WITH DIFFERENTIAL/PLATELET
Basophils Absolute: 0.1 10*3/uL (ref 0.0–0.1)
Basophils Relative: 0.5 % (ref 0.0–3.0)
Eosinophils Absolute: 0.3 10*3/uL (ref 0.0–0.7)
Eosinophils Relative: 3 % (ref 0.0–5.0)
HEMATOCRIT: 38.4 % — AB (ref 39.0–52.0)
HEMOGLOBIN: 12.8 g/dL — AB (ref 13.0–17.0)
LYMPHS PCT: 38 % (ref 12.0–46.0)
Lymphs Abs: 3.6 10*3/uL (ref 0.7–4.0)
MCHC: 33.4 g/dL (ref 30.0–36.0)
MCV: 85.5 fl (ref 78.0–100.0)
MONOS PCT: 9.6 % (ref 3.0–12.0)
Monocytes Absolute: 0.9 10*3/uL (ref 0.1–1.0)
NEUTROS ABS: 4.7 10*3/uL (ref 1.4–7.7)
Neutrophils Relative %: 48.9 % (ref 43.0–77.0)
PLATELETS: 244 10*3/uL (ref 150.0–400.0)
RBC: 4.49 Mil/uL (ref 4.22–5.81)
RDW: 14.3 % (ref 11.5–15.5)
WBC: 9.6 10*3/uL (ref 4.0–10.5)

## 2016-10-31 LAB — COMPREHENSIVE METABOLIC PANEL
ALBUMIN: 3.9 g/dL (ref 3.5–5.2)
ALK PHOS: 92 U/L (ref 39–117)
ALT: 14 U/L (ref 0–53)
AST: 18 U/L (ref 0–37)
BUN: 23 mg/dL (ref 6–23)
CALCIUM: 9.1 mg/dL (ref 8.4–10.5)
CO2: 27 mEq/L (ref 19–32)
CREATININE: 1.27 mg/dL (ref 0.40–1.50)
Chloride: 105 mEq/L (ref 96–112)
GFR: 57.04 mL/min — ABNORMAL LOW (ref >60.00)
Glucose, Bld: 73 mg/dL (ref 70–99)
POTASSIUM: 3.7 meq/L (ref 3.5–5.1)
SODIUM: 142 meq/L (ref 135–145)
Total Bilirubin: 0.6 mg/dL (ref 0.2–1.2)
Total Protein: 6.3 g/dL (ref 6.0–8.3)

## 2016-10-31 LAB — LIPID PANEL
CHOLESTEROL: 88 mg/dL (ref 0–200)
HDL: 31.5 mg/dL — ABNORMAL LOW (ref >39.00)
LDL Cholesterol: 37 mg/dL (ref 0–99)
NonHDL: 56.57
Total CHOL/HDL Ratio: 3
Triglycerides: 99 mg/dL (ref 0.0–149.0)
VLDL: 19.8 mg/dL (ref 0.0–40.0)

## 2016-10-31 NOTE — Progress Notes (Signed)
Subjective:  Patient ID: Nicholas Burton, male    DOB: 03/26/1928  Age: 83 y.o. MRN: [id number]  CC: Follow-up (htn, dm TYPE 2); Hypertension; Hyperlipidemia; and Diabetes   HPI Nicholas Burton presents for f/up - he feels well and offers no complaints.  Outpatient Medications Prior to Visit  Medication Sig Dispense Refill  . aspirin 81 MG tablet Take 81 mg by mouth daily.      Marland Kitchen atorvastatin (LIPITOR) 10 MG tablet TAKE 1 TABLET DAILY 90 tablet 3  . B-D ULTRAFINE III [place] PEN 31G X 8 MM MISC USE TO CHECK BLOOD SUGAR UP TO THREE TIMES A DAY 300 each 3  . Blood Glucose Monitoring Suppl (FREESTYLE FREEDOM LITE) W/DEVICE KIT Use to check blood sugars daily Dx E11.8 1 each 0  . cholecalciferol (VITAMIN D) 1000 units tablet Take 1,000 Units by mouth daily.    . cyanocobalamin 1000 MCG tablet Take 1,000 mcg by mouth daily.    Marland Kitchen FREESTYLE LITE test strip USE TO CHECK BLOOD SUGAR TWICE A DAY 200 each 2  . KOMBIGLYZE XR 2.12-998 MG TB24 TAKE 2 TABLETS DAILY 180 tablet 3  . Lancets (FREESTYLE) lancets Use to check blood sugars twice day Dx E11.8 180 each 3  . LANTUS SOLOSTAR 100 UNIT/ML Solostar Pen INJECT 40 UNITS UNDER THE SKIN DAILY 45 mL 3  . losartan-hydrochlorothiazide (HYZAAR) 100-12.5 MG tablet TAKE 1 TABLET DAILY 90 tablet 2  . Multiple Vitamins-Minerals (PRESERVISION/LUTEIN) CAPS Take 1 capsule by mouth 2 (two) times daily.      . WELCHOL 3.75 g PACK MIX THE CONTENTS OF 1 PACKET IN FLUID AND DRINK DAILY 90 each 2  . tobramycin (TOBREX) 0.3 % ophthalmic solution      No facility-administered medications prior to visit.     ROS Review of Systems  Constitutional: Negative.  Negative for activity change, diaphoresis, fatigue and unexpected weight change.  HENT: Negative.   Eyes: Negative for visual disturbance.  Respiratory: Negative for cough, chest tightness, shortness of breath and wheezing.   Cardiovascular: Negative for chest pain, palpitations and leg swelling.  Gastrointestinal:  Negative for abdominal pain, constipation, diarrhea, nausea and vomiting.  Endocrine: Negative for cold intolerance, heat intolerance, polydipsia, polyphagia and polyuria.  Genitourinary: Negative.  Negative for difficulty urinating and dysuria.  Musculoskeletal: Negative for back pain, myalgias and neck pain.  Skin: Negative.  Negative for color change and rash.  Allergic/Immunologic: Negative.   Neurological: Negative.  Negative for dizziness, weakness and numbness.  Hematological: Negative.  Negative for adenopathy. Does not bruise/bleed easily.  Psychiatric/Behavioral: Negative.  Negative for behavioral problems, self-injury and suicidal ideas. The patient is not nervous/anxious.     Objective:  BP 116/84   Pulse 79   Temp 97.7 F (36.5 C) (Oral)   Resp 16   Ht _0  (1.753 m)   Wt 207 lb (93.9 kg)   SpO2 98%   BMI 30.57 kg/m   BP Readings from Last 3 Encounters:  10/31/16 116/84  05/03/16 124/80  02/16/16 133/67    Wt Readings from Last 3 Encounters:  10/31/16 207 lb (93.9 kg)  05/03/16 206 lb 4 oz (93.6 kg)  02/16/16 208 lb 14.4 oz (94.8 kg)    Physical Exam  Constitutional: He is oriented to person, place, and time. No distress.  HENT:  Mouth/Throat: Oropharynx is clear and moist. No oropharyngeal exudate.  Eyes: Conjunctivae are normal. Right eye exhibits no discharge. Left eye exhibits no discharge. No scleral icterus.  Neck:  Normal range of motion. Neck supple. No JVD present. No tracheal deviation present. No thyromegaly present.  Cardiovascular: Normal rate, regular rhythm, normal heart sounds and intact distal pulses.  Exam reveals no gallop and no friction rub.   No murmur heard. Pulmonary/Chest: Effort normal and breath sounds normal. No stridor. No respiratory distress. He has no wheezes. He has no rales. He exhibits no tenderness.  Abdominal: Soft. Bowel sounds are normal. He exhibits no distension and no mass. There is no tenderness. There is no rebound  and no guarding.  Musculoskeletal: Normal range of motion. He exhibits no edema, tenderness or deformity.  Lymphadenopathy:    He has no cervical adenopathy.  Neurological: He is oriented to person, place, and time.  Skin: Skin is warm and dry. No rash noted. He is not diaphoretic. No erythema. No pallor.  Psychiatric: He has a normal mood and affect. His behavior is normal. Judgment and thought content normal.  Vitals reviewed.   Lab Results  Component Value Date   WBC 9.6 10/31/2016   HGB 12.8 (L) 10/31/2016   HCT 38.4 (L) 10/31/2016   PLT 244.0 10/31/2016   GLUCOSE 73 10/31/2016   CHOL 88 10/31/2016   TRIG 99.0 10/31/2016   HDL 31.50 (L) 10/31/2016   LDLCALC 37 10/31/2016   ALT 14 10/31/2016   AST 18 10/31/2016   NA 142 10/31/2016   K 3.7 10/31/2016   CL 105 10/31/2016   CREATININE 1.27 10/31/2016   BUN 23 10/31/2016   CO2 27 10/31/2016   TSH 2.84 12/31/2015   INR 1.3 ratio (H) 08/11/2010   HGBA1C 6.8 (H) 10/31/2016   MICROALBUR <0.7 05/03/2016    Dg Chest 2 View  Result Date: 02/05/2016 CLINICAL DATA:  Melanoma. EXAM: CHEST  2 VIEW COMPARISON:  01/29/2015. FINDINGS: The cardiac silhouette, mediastinal and hilar contours are within normal limits for age and stable. There is mild tortuosity of the thoracic aorta. Stable basilar scarring changes. No infiltrates, edema or effusions. No worrisome pulmonary lesions. The bony thorax is intact. IMPRESSION: No acute cardiopulmonary findings no worrisome pulmonary lesions. Stable basilar scarring changes. Electronically Signed   By: Marijo Sanes M.D.   On: 02/05/2016 14:08    Assessment & Plan:   Nicholas Burton was seen today for follow-up, hypertension, hyperlipidemia and diabetes.  Diagnoses and all orders for this visit:  Essential hypertension- His BP is well controlled, lytes and renal function are normal -     Comprehensive metabolic panel; Future  Type 2 diabetes mellitus with complication, without long-term current use of  insulin ([place])- his blood sugars are adequately well controlled, will cont the same for now -     Hemoglobin A1c; Future  Hyperlipidemia with target LDL less than 100- he has achieved his LDL goal and is doing well on the statin -     Lipid panel; Future  B12 deficiency- he is mildly anemic again, I have asked him to be compliant with B12 replacement therapy -     CBC with Differential/Platelet; Future   I have discontinued Nicholas Burton's tobramycin. I am also having him maintain his aspirin, PRESERVISION/LUTEIN, FREESTYLE FREEDOM LITE, freestyle, KOMBIGLYZE XR, LANTUS SOLOSTAR, FREESTYLE LITE, cyanocobalamin, cholecalciferol, losartan-hydrochlorothiazide, WELCHOL, atorvastatin, and B-D ULTRAFINE III Garr PEN.  No orders of the defined types were placed in this encounter.    Follow-up: Return in about 4 months (around 03/02/2017).  Scarlette Calico, MD

## 2016-10-31 NOTE — Progress Notes (Signed)
Pre-visit discussion using our clinic review tool. No additional management support is needed unless otherwise documented below in the visit note.  

## 2016-10-31 NOTE — Patient Instructions (Signed)

## 2016-11-08 DIAGNOSIS — Z961 Presence of intraocular lens: Secondary | ICD-10-CM | POA: Diagnosis not present

## 2016-12-19 ENCOUNTER — Ambulatory Visit (INDEPENDENT_AMBULATORY_CARE_PROVIDER_SITE_OTHER): Payer: Medicare Other | Admitting: *Deleted

## 2016-12-19 VITALS — BP 148/88 | HR 80 | Resp 20 | Ht 69.0 in | Wt 208.0 lb

## 2016-12-19 DIAGNOSIS — Z0000 Encounter for general adult medical examination without abnormal findings: Secondary | ICD-10-CM

## 2016-12-19 NOTE — Patient Instructions (Addendum)
Start to eat heart healthy diet (full of fruits, vegetables, whole grains, lean protein, water--limit salt, fat, and sugar intake) and increase physical activity as tolerated.   Continue doing brain stimulating activities (puzzles, reading, adult coloring books, staying active) to keep memory sharp.    Nicholas Burton , Thank you for taking time to come for your Medicare Wellness Visit. I appreciate your ongoing commitment to your health goals. Please review the following plan we discussed and let me know if I can assist you in the future.   These are the goals we discussed: Goals    . Continue to be active as possible and enjoy  my family and life.       This is a list of the screening recommended for you and due dates:  Health Maintenance  Topic Date Due  . Flu Shot  03/29/2017  . Hemoglobin A1C  05/03/2017  . Eye exam for diabetics  06/15/2017  . Complete foot exam   10/31/2017  . Tetanus Vaccine  04/02/2023  . Pneumonia vaccines  Completed

## 2016-12-19 NOTE — Progress Notes (Addendum)
Subjective:   Nicholas Burton is a 83 y.o. male who presents for an Initial Medicare Annual Wellness Visit.  Review of Systems  No ROS.  Medicare Wellness Visit.  Cardiac Risk Factors include: advanced age (>67mn, >>43women);diabetes mellitus;dyslipidemia;hypertension;male gender;family history of premature cardiovascular disease Sleep patterns: feels rested on waking, gets up 1-2 times nightly to void and sleeps 7-8 hours nightly.   Home Safety/Smoke Alarms:  Feels safe in home. Smoke alarms in place.   Living environment; residence and Firearm Safety: 1-story house/ trailer, number of outside stairs: 4, can live on one level, equipment: Cane, Type: [place] Star Walkers, Type: RConservation officer, nature HOmnicom Type: Commode and tub chair, no firearms. Lives with wife, close family support Seat Belt Safety/Bike Helmet: Wears seat belt.   Counseling:   Eye Exam- appointment yearly, Dr. STye Savoy also seen at retinal center for macular degeneration Dental- appointment every 6 months  Male:   CCS- N/A due to age    PSA- No results found for: PSA    Objective:    Today's Vitals   12/19/16 1016  BP: (!) 148/88  Pulse: 80  Resp: 20  SpO2: 98%  Weight: 208 lb (94.3 kg)  Height: '5\' 9"'$  (1.753 m)   Body mass index is 30.72 kg/m.  Current Medications (verified) Outpatient Encounter Prescriptions as of 12/19/2016  Medication Sig  . aspirin 81 MG tablet Take 81 mg by mouth daily.    .Marland Kitchenatorvastatin (LIPITOR) 10 MG tablet TAKE 1 TABLET DAILY  . B-D ULTRAFINE III [place]merer PEN 31G X 8 MM MISC USE TO CHECK BLOOD SUGAR UP TO THREE TIMES A DAY  . Blood Glucose Monitoring Suppl (FREESTYLE FREEDOM LITE) W/DEVICE KIT Use to check blood sugars daily Dx E11.8  . cholecalciferol (VITAMIN D) 1000 units tablet Take 1,000 Units by mouth daily.  . cyanocobalamin 1000 MCG tablet Take 1,000 mcg by mouth daily.  .Marland KitchenFREESTYLE LITE test strip USE TO CHECK BLOOD SUGAR TWICE A DAY  . KOMBIGLYZE XR 2.12-998  MG TB24 TAKE 2 TABLETS DAILY  . Lancets (FREESTYLE) lancets Use to check blood sugars twice day Dx E11.8  . LANTUS SOLOSTAR 100 UNIT/ML Solostar Pen INJECT 40 UNITS UNDER THE SKIN DAILY  . losartan-hydrochlorothiazide (HYZAAR) 100-12.5 MG tablet TAKE 1 TABLET DAILY  . Multiple Vitamins-Minerals (PRESERVISION/LUTEIN) CAPS Take 1 capsule by mouth 2 (two) times daily.    . WELCHOL 3.75 g PACK MIX THE CONTENTS OF 1 PACKET IN FLUID AND DRINK DAILY   No facility-administered encounter medications on file as of 12/19/2016.     Allergies (verified) Ace inhibitors; Oxycodone-acetaminophen; and Penicillins   History: Past Medical History:  Diagnosis Date  . Allergy   . [place]  . Cataract   . Diabetes mellitus    type 2  . History of skin cancer   . Hyperlipidemia   . Hypertension   . Osteoarthritis   . Vitamin D deficiency    Past Surgical History:  Procedure Laterality Date  . CHOLECYSTECTOMY    . INGUINAL HERNIA REPAIR     x 3  . JOINT REPLACEMENT    . KNEE SURGERY     x 2  . MELANOMA EXCISION     x 3 -- Left arm  . TOTAL KNEE ARTHROPLASTY     x 3   Family History  Problem Relation Age of Onset  . Heart disease Mother   . Diabetes Mother   . Heart disease Father   .  Cancer Sister   . Cancer Brother   . Diabetes Son   . Diabetes Other    Social History   Occupational History  . retired Retired   Social History Main Topics  . Smoking status: Never Smoker  . Smokeless tobacco: Never Used  . Alcohol use No  . Drug use: No  . Sexual activity: Yes    Birth control/ protection: None   Tobacco Counseling Counseling given: Not Answered   Activities of Daily Living In your present state of health, do you have any difficulty performing the following activities: 12/19/2016  Hearing? Y  Vision? Y  Difficulty concentrating or making decisions? N  Walking or climbing stairs? Y  Dressing or bathing? N  Doing errands, shopping? N  Preparing Food and  eating ? N  Using the Toilet? N  In the past six months, have you accidently leaked urine? N  Do you have problems with loss of bowel control? N  Managing your Medications? N  Managing your Finances? N  Housekeeping or managing your Housekeeping? N  Some recent data might be hidden    Immunizations and Health Maintenance Immunization History  Administered Date(s) Administered  . Influenza Split 05/24/2012  . Influenza Whole 08/30/2007, 05/12/2010  . Influenza, High Dose Seasonal PF 04/28/2015, 05/03/2016  . Influenza,inj,Quad PF,36+ Mos 06/03/2013, 05/20/2014  . Pneumococcal Conjugate-13 06/12/2014  . Pneumococcal Polysaccharide-23 08/29/2006, 04/01/2013  . Td 08/29/2002  . Tdap 04/01/2013  . Varicella 07/21/2008  . Zoster 07/16/2008   There are no preventive care reminders to display for this patient.  Patient Care Team: [name], MD as PCP - General  Indicate any recent Medical Services you may have received from other than [facility] providers in the past year (date may be approximate).    Assessment:   This is a routine wellness examination for Nicholas Burton. Physical assessment deferred to PCP.   Hearing/Vision screen Hearing Screening Comments: [facility] Vision Screening Comments: Macular degeneration, goes to retinal specialist  Dietary issues and exercise activities discussed: Current Exercise Habits: The patient does not participate in regular exercise at present (states he stays active at home in the yard and with hous-hold duties), Exercise limited by: orthopedic condition(s)  Diet (meal preparation, eat out, water intake, caffeinated beverages, dairy products, fruits and vegetables): Patient reports eating out about 50% of the time, which makes it hard to stay focus on diabetes and heart healthy diet. Drinks 1-2 cups of coffee daily, 2-3 diet sodas weekly, around 6 glasses of water daily.  Reviewed diabetic and heart healthy  diet. Discuss strategies to stay on diet plan when eating out frequently, weight loss for diabetes strategy document given to patient   Goals    . Continue to be active as possible and enjoy  my family and life.      Depression Screen PHQ 2/9 Scores 12/19/2016 12/19/2016 04/28/2015 04/01/2013  PHQ - 2 Score 0 0 0 0  PHQ- 9 Score 0 - - -    Fall Risk Fall Risk  12/19/2016 04/28/2015 04/01/2013 01/07/2013 01/02/2013  Falls in the past year? No No No No No  Risk for fall due to : Impaired mobility;Impaired balance/gait - - - -    Cognitive Function: MMSE - Mini Mental State Exam 12/19/2016  Orientation to time 5  Orientation to Place 5  Registration 3  Attention/ Calculation 4  Recall 2  Language- name 2 objects 2  Language- repeat 1  Language- follow  3 step command 3  Language- read & follow direction 1  Write a sentence 1  Copy design 1  Total score 28        Screening Tests Health Maintenance  Topic Date Due  . INFLUENZA VACCINE  03/29/2017  . HEMOGLOBIN A1C  05/03/2017  . OPHTHALMOLOGY EXAM  06/15/2017  . FOOT EXAM  10/31/2017  . TETANUS/TDAP  04/02/2023  . PNA vac Low Risk Adult  Completed        Plan:     Start to eat heart healthy diet (full of fruits, vegetables, whole grains, lean protein, water--limit salt, fat, and sugar intake) and increase physical activity as tolerated.  Continue doing brain stimulating activities (puzzles, reading, adult coloring books, staying active) to keep memory sharp.    During the course of the visit Nicholas Burton was educated and counseled about the following appropriate screening and preventive services:   Vaccines to include Pneumoccal, Influenza, Hepatitis B, Td, Zostavax, HCV  Colorectal cancer screening  Cardiovascular disease screening  Diabetes screening  Glaucoma screening  Nutrition counseling  Prostate cancer screening   Patient Instructions (the written plan) were given to the patient.   Michiel Cowboy, RN   12/19/2016       Medical screening examination/treatment/procedure(s) were performed by non-physician practitioner and as supervising physician I was immediately available for consultation/collaboration. I agree with above. Scarlette Calico, MD

## 2016-12-19 NOTE — Progress Notes (Signed)
Pre visit review using our clinic review tool, if applicable. No additional management support is needed unless otherwise documented below in the visit note. 

## 2016-12-28 DIAGNOSIS — H353113 Nonexudative age-related macular degeneration, right eye, advanced atrophic without subfoveal involvement: Secondary | ICD-10-CM | POA: Diagnosis not present

## 2016-12-28 DIAGNOSIS — H353222 Exudative age-related macular degeneration, left eye, with inactive choroidal neovascularization: Secondary | ICD-10-CM | POA: Diagnosis not present

## 2016-12-28 DIAGNOSIS — H43813 Vitreous degeneration, bilateral: Secondary | ICD-10-CM | POA: Diagnosis not present

## 2016-12-28 DIAGNOSIS — E119 Type 2 diabetes mellitus without complications: Secondary | ICD-10-CM | POA: Diagnosis not present

## 2017-01-22 ENCOUNTER — Other Ambulatory Visit: Payer: Self-pay | Admitting: Internal Medicine

## 2017-01-30 DIAGNOSIS — Z85828 Personal history of other malignant neoplasm of skin: Secondary | ICD-10-CM | POA: Diagnosis not present

## 2017-01-30 DIAGNOSIS — Z85820 Personal history of malignant melanoma of skin: Secondary | ICD-10-CM | POA: Diagnosis not present

## 2017-01-30 DIAGNOSIS — L821 Other seborrheic keratosis: Secondary | ICD-10-CM | POA: Diagnosis not present

## 2017-01-30 DIAGNOSIS — L578 Other skin changes due to chronic exposure to nonionizing radiation: Secondary | ICD-10-CM | POA: Diagnosis not present

## 2017-01-30 DIAGNOSIS — D225 Melanocytic nevi of trunk: Secondary | ICD-10-CM | POA: Diagnosis not present

## 2017-02-07 ENCOUNTER — Other Ambulatory Visit (HOSPITAL_BASED_OUTPATIENT_CLINIC_OR_DEPARTMENT_OTHER): Payer: Medicare Other

## 2017-02-07 DIAGNOSIS — Z85820 Personal history of malignant melanoma of skin: Secondary | ICD-10-CM | POA: Diagnosis present

## 2017-02-07 DIAGNOSIS — C439 Malignant melanoma of skin, unspecified: Secondary | ICD-10-CM

## 2017-02-07 LAB — LACTATE DEHYDROGENASE: LDH: 149 U/L (ref 125–245)

## 2017-02-09 ENCOUNTER — Other Ambulatory Visit: Payer: Self-pay | Admitting: Internal Medicine

## 2017-02-14 ENCOUNTER — Ambulatory Visit (HOSPITAL_BASED_OUTPATIENT_CLINIC_OR_DEPARTMENT_OTHER): Payer: Medicare Other | Admitting: Oncology

## 2017-02-14 ENCOUNTER — Telehealth: Payer: Self-pay | Admitting: Oncology

## 2017-02-14 VITALS — BP 146/74 | HR 82 | Temp 98.6°F | Resp 18 | Ht 69.0 in | Wt 208.4 lb

## 2017-02-14 DIAGNOSIS — Z85820 Personal history of malignant melanoma of skin: Secondary | ICD-10-CM

## 2017-02-14 DIAGNOSIS — C439 Malignant melanoma of skin, unspecified: Secondary | ICD-10-CM

## 2017-02-14 NOTE — Telephone Encounter (Signed)
No LOS per 6/19

## 2017-02-14 NOTE — Progress Notes (Signed)
Hematology and Oncology Follow Up Visit  Nicholas Burton [id number] June 13, 1928 83 y.o. 02/14/2017 1:41 PM Nicholas Burton, MDJones, [name], MD   Principle Diagnosis:  83 year old gentleman with melanoma dating back to 2002. He is status post surgical resection and sentinel lymph node biopsy of the left axilla for a lesion on his left arm. He did have a recurrence in 2005 and in 2007. His most recent recurrence in 2007 revealed 0.54 mm depth of invasion and a Clark level III.   Prior Therapy: He status post repeat excision in 2007 without any disease recurrence since that time.   Current therapy: Observation and surveillance.  Interim History:  [name] presents today for a follow-up visit. Since the last visit, he has no complaints. He denied any recent skin rashes or lesions. He continues to follow with dermatology. He continues to work outside and wear hat regularly. His appetite is reasonable without any decline in his weight. He denied any abdominal pain or distention. He denied any bone pain or pathological fractures. His performance status remained at baseline.  He does not report any headaches blurred vision or double vision. Does not report any alteration of mental status her psychiatric issues. Does not report any fevers or chills or sweats. Does not report any weight loss or appetite changes. He does not report any any chest pain shortness of breath cough or hemoptysis. Does not report any nausea or vomiting or abdominal pain. Does not report any change in his bowel habits or urinary symptoms. Does not report any skin rashes or lesions. Is not reporting any lymphadenopathy or petechiae. He does not report any depression or anxiety. Rest of his review of systems unremarkable.   Medications: I have reviewed the patient's current medications.   Current Outpatient Prescriptions  Medication Sig Dispense Refill  . aspirin 81 MG tablet Take 81 mg by mouth daily.      Marland Kitchen atorvastatin (LIPITOR)  10 MG tablet TAKE 1 TABLET DAILY 90 tablet 3  . B-D ULTRAFINE III Kuehl PEN 31G X 8 MM MISC USE TO CHECK BLOOD SUGAR UP TO THREE TIMES A DAY 300 each 3  . Blood Glucose Monitoring Suppl (FREESTYLE FREEDOM LITE) W/DEVICE KIT Use to check blood sugars daily Dx E11.8 1 each 0  . cholecalciferol (VITAMIN D) 1000 units tablet Take 1,000 Units by mouth daily.    . cyanocobalamin 1000 MCG tablet Take 1,000 mcg by mouth daily.    Marland Kitchen FREESTYLE LITE test strip USE TO CHECK BLOOD SUGAR TWICE A DAY 200 each 2  . KOMBIGLYZE XR 2.12-998 MG TB24 TAKE 2 TABLETS DAILY 180 tablet 1  . Lancets (FREESTYLE) lancets Use to check blood sugars twice day Dx E11.8 180 each 3  . LANTUS SOLOSTAR 100 UNIT/ML Solostar Pen INJECT 40 UNITS UNDER THE SKIN DAILY 45 mL 3  . losartan-hydrochlorothiazide (HYZAAR) 100-12.5 MG tablet TAKE 1 TABLET DAILY 90 tablet 2  . Multiple Vitamins-Minerals (PRESERVISION/LUTEIN) CAPS Take 1 capsule by mouth 2 (two) times daily.      . WELCHOL 3.75 g PACK MIX THE CONTENTS OF 1 PACKET IN FLUID AND DRINK DAILY 90 each 2   No current facility-administered medications for this visit.      Allergies:  Allergies  Allergen Reactions  . Ace Inhibitors     REACTION: cough  . Oxycodone-Acetaminophen   . Penicillins     Past Medical History, Surgical history, Social history, and Family History were reviewed and updated.   Physical Exam: Blood  pressure (!) 146/74, pulse 82, temperature 98.6 F (37 C), temperature source Oral, resp. rate 18, height '5\' 9"'$  (1.753 m), weight 208 lb 6.4 oz (94.5 kg), SpO2 98 %. ECOG: 1 General appearance: Well-appearing gentleman without distress. Head: Normocephalic, without obvious abnormality no oral thrush or ulcers. Neck: no adenopathy and no carotid bruit Lymph nodes: Cervical, supraclavicular, and axillary nodes normal. Heart:regular rate and rhythm, S1, S2 normal, no murmur, click, rub or gallop Lung:chest clear, no wheezing, rales, normal symmetric air  entry Abdomin: soft, non-tender, without masses or organomegaly shifting dullness or ascites. EXT:no erythema, induration, or nodules Skin: No suspicious lesions noted.  Lab Results: Lab Results  Component Value Date   WBC 9.6 10/31/2016   HGB 12.8 (L) 10/31/2016   HCT 38.4 (L) 10/31/2016   MCV 85.5 10/31/2016   PLT 244.0 10/31/2016     Chemistry      Component Value Date/Time   NA 142 10/31/2016 1052   NA 139 02/05/2016 1343   K 3.7 10/31/2016 1052   K 4.1 02/05/2016 1343   CL 105 10/31/2016 1052   CO2 27 10/31/2016 1052   CO2 26 02/05/2016 1343   BUN 23 10/31/2016 1052   BUN 20.8 02/05/2016 1343   CREATININE 1.27 10/31/2016 1052   CREATININE 1.3 02/05/2016 1343      Component Value Date/Time   CALCIUM 9.1 10/31/2016 1052   CALCIUM 9.0 02/05/2016 1343   ALKPHOS 92 10/31/2016 1052   ALKPHOS 98 02/05/2016 1343   AST 18 10/31/2016 1052   AST 19 02/05/2016 1343   ALT 14 10/31/2016 1052   ALT 15 02/05/2016 1343   BILITOT 0.6 10/31/2016 1052   BILITOT 0.57 02/05/2016 1343       Impression and Plan:   83 year old gentleman with the following issues:  1. Melanoma dating back to 2002. He is status post surgical resection and sentinel lymph node biopsy of the left axilla for a lesion on his left arm. He did have a recurrence in 2005 and in 2007. His most recent recurrence in 2007 revealed 0.54 mm depth of invasion and a Clark level III. He status post repeat excision without any disease recurrence since that time.   He has no evidence of recurrent disease over 10 years out from his most recent excision. I encouraged him to continue follow with dermatology and definitely. He does not require any further medical oncology follow-up unless other issues arise.    2. Follow-up: I will happy to see him in the future as needed.   Zola Button, MD 6/19/20181:41 PM

## 2017-02-26 ENCOUNTER — Other Ambulatory Visit: Payer: Self-pay | Admitting: Internal Medicine

## 2017-03-02 ENCOUNTER — Ambulatory Visit (INDEPENDENT_AMBULATORY_CARE_PROVIDER_SITE_OTHER): Payer: Medicare Other | Admitting: Internal Medicine

## 2017-03-02 ENCOUNTER — Other Ambulatory Visit (INDEPENDENT_AMBULATORY_CARE_PROVIDER_SITE_OTHER): Payer: Medicare Other

## 2017-03-02 ENCOUNTER — Encounter: Payer: Self-pay | Admitting: Internal Medicine

## 2017-03-02 VITALS — BP 112/80 | HR 78 | Temp 97.7°F | Resp 16 | Ht 69.0 in | Wt 205.5 lb

## 2017-03-02 DIAGNOSIS — E538 Deficiency of other specified B group vitamins: Secondary | ICD-10-CM

## 2017-03-02 DIAGNOSIS — E118 Type 2 diabetes mellitus with unspecified complications: Secondary | ICD-10-CM | POA: Diagnosis not present

## 2017-03-02 DIAGNOSIS — I10 Essential (primary) hypertension: Secondary | ICD-10-CM

## 2017-03-02 DIAGNOSIS — J449 Chronic obstructive pulmonary disease, unspecified: Secondary | ICD-10-CM | POA: Diagnosis not present

## 2017-03-02 DIAGNOSIS — R059 Cough, unspecified: Secondary | ICD-10-CM

## 2017-03-02 DIAGNOSIS — E669 Obesity, unspecified: Secondary | ICD-10-CM

## 2017-03-02 DIAGNOSIS — R05 Cough: Secondary | ICD-10-CM

## 2017-03-02 HISTORY — DX: Chronic obstructive pulmonary disease, unspecified: J44.9

## 2017-03-02 LAB — CBC WITH DIFFERENTIAL/PLATELET
BASOS PCT: 0.7 % (ref 0.0–3.0)
Basophils Absolute: 0.1 10*3/uL (ref 0.0–0.1)
EOS PCT: 2.8 % (ref 0.0–5.0)
Eosinophils Absolute: 0.3 10*3/uL (ref 0.0–0.7)
HCT: 37.5 % — ABNORMAL LOW (ref 39.0–52.0)
HEMOGLOBIN: 12.8 g/dL — AB (ref 13.0–17.0)
LYMPHS ABS: 4.7 10*3/uL — AB (ref 0.7–4.0)
Lymphocytes Relative: 41.7 % (ref 12.0–46.0)
MCHC: 34.1 g/dL (ref 30.0–36.0)
MCV: 84.6 fl (ref 78.0–100.0)
MONO ABS: 1 10*3/uL (ref 0.1–1.0)
Monocytes Relative: 8.8 % (ref 3.0–12.0)
NEUTROS PCT: 46 % (ref 43.0–77.0)
Neutro Abs: 5.2 10*3/uL (ref 1.4–7.7)
Platelets: 249 10*3/uL (ref 150.0–400.0)
RBC: 4.43 Mil/uL (ref 4.22–5.81)
RDW: 14.2 % (ref 11.5–15.5)
WBC: 11.2 10*3/uL — ABNORMAL HIGH (ref 4.0–10.5)

## 2017-03-02 LAB — BASIC METABOLIC PANEL
BUN: 26 mg/dL — ABNORMAL HIGH (ref 6–23)
CHLORIDE: 106 meq/L (ref 96–112)
CO2: 29 meq/L (ref 19–32)
Calcium: 9.3 mg/dL (ref 8.4–10.5)
Creatinine, Ser: 1.34 mg/dL (ref 0.40–1.50)
GFR: 53.57 mL/min — ABNORMAL LOW (ref >60.00)
GLUCOSE: 97 mg/dL (ref 70–99)
POTASSIUM: 3.8 meq/L (ref 3.5–5.1)
Sodium: 141 mEq/L (ref 135–145)

## 2017-03-02 LAB — HEMOGLOBIN A1C: HEMOGLOBIN A1C: 6.8 % — AB (ref 4.6–6.5)

## 2017-03-02 LAB — POCT EXHALED NITRIC OXIDE: FeNO level (ppb): 19

## 2017-03-02 MED ORDER — FLUTICASONE-UMECLIDIN-VILANT 100-62.5-25 MCG/INH IN AEPB: 1.0000 | INHALATION_SPRAY | Freq: Every day | RESPIRATORY_TRACT | 11 refills | Status: DC

## 2017-03-02 NOTE — Patient Instructions (Signed)
Cough, Adult Coughing is a reflex that clears your throat and your airways. Coughing helps to heal and protect your lungs. It is normal to cough occasionally, but a cough that happens with other symptoms or lasts a long time may be a sign of a condition that needs treatment. A cough may last only 2-3 weeks (acute), or it may last longer than 8 weeks (chronic). What are the causes? Coughing is commonly caused by:  Breathing in substances that irritate your lungs.  A viral or bacterial respiratory infection.  Allergies.  Asthma.  Postnasal drip.  Smoking.  Acid backing up from the stomach into the esophagus (gastroesophageal reflux).  Certain medicines.  Chronic lung problems, including COPD (or rarely, lung cancer).  Other medical conditions such as heart failure.  Follow these instructions at home: Pay attention to any changes in your symptoms. Take these actions to help with your discomfort:  Take medicines only as told by your health care provider. ? If you were prescribed an antibiotic medicine, take it as told by your health care provider. Do not stop taking the antibiotic even if you start to feel better. ? Talk with your health care provider before you take a cough suppressant medicine.  Drink enough fluid to keep your urine clear or pale yellow.  If the air is dry, use a cold steam vaporizer or humidifier in your bedroom or your home to help loosen secretions.  Avoid anything that causes you to cough at work or at home.  If your cough is worse at night, try sleeping in a semi-upright position.  Avoid cigarette smoke. If you smoke, quit smoking. If you need help quitting, ask your health care provider.  Avoid caffeine.  Avoid alcohol.  Rest as needed.  Contact a health care provider if:  You have new symptoms.  You cough up pus.  Your cough does not get better after 2-3 weeks, or your cough gets worse.  You cannot control your cough with suppressant  medicines and you are losing sleep.  You develop pain that is getting worse or pain that is not controlled with pain medicines.  You have a fever.  You have unexplained weight loss.  You have night sweats. Get help right away if:  You cough up blood.  You have difficulty breathing.  Your heartbeat is very fast. This information is not intended to replace advice given to you by your health care provider. Make sure you discuss any questions you have with your health care provider. Document Released: 02/11/2011 Document Revised: 01/21/2016 Document Reviewed: 10/22/2014 Elsevier Interactive Patient Education  2017 Elsevier Inc.  

## 2017-03-02 NOTE — Progress Notes (Signed)
Subjective:  Patient ID: Nicholas Burton, male    DOB: 1927/12/12  Age: 83 y.o. MRN: [id number]  CC: Cough   HPI CHANC KERVIN presents for a 6 month history of nonproductive cough and rare shortness of breath but no wheezing. He recently had a chest x-ray that was normal. He has a long history of exposure to secondhand smoke.  Outpatient Medications Prior to Visit  Medication Sig Dispense Refill  . aspirin 81 MG tablet Take 81 mg by mouth daily.      Marland Kitchen atorvastatin (LIPITOR) 10 MG tablet TAKE 1 TABLET DAILY 90 tablet 3  . B-D ULTRAFINE III Carrington PEN 31G X 8 MM MISC USE TO CHECK BLOOD SUGAR UP TO THREE TIMES A DAY 300 each 3  . Blood Glucose Monitoring Suppl (FREESTYLE FREEDOM LITE) W/DEVICE KIT Use to check blood sugars daily Dx E11.8 1 each 0  . cholecalciferol (VITAMIN D) 1000 units tablet Take 1,000 Units by mouth daily.    . cyanocobalamin 1000 MCG tablet Take 1,000 mcg by mouth daily.    Marland Kitchen FREESTYLE LITE test strip USE TO CHECK BLOOD SUGAR TWICE A DAY 200 each 2  . KOMBIGLYZE XR 2.12-998 MG TB24 TAKE 2 TABLETS DAILY 180 tablet 1  . Lancets (FREESTYLE) lancets Use to check blood sugars twice day Dx E11.8 180 each 3  . LANTUS SOLOSTAR 100 UNIT/ML Solostar Pen INJECT 40 UNITS UNDER THE SKIN DAILY 45 mL 3  . losartan-hydrochlorothiazide (HYZAAR) 100-12.5 MG tablet TAKE 1 TABLET DAILY 90 tablet 1  . Multiple Vitamins-Minerals (PRESERVISION/LUTEIN) CAPS Take 1 capsule by mouth 2 (two) times daily.      . WELCHOL 3.75 g PACK MIX THE CONTENTS OF 1 PACKET IN FLUID AND DRINK DAILY 90 each 2   No facility-administered medications prior to visit.     ROS Review of Systems  Constitutional: Negative for chills, diaphoresis, fatigue and fever.  HENT: Negative.  Negative for sinus pressure and trouble swallowing.   Eyes: Negative for visual disturbance.  Respiratory: Positive for cough and shortness of breath. Negative for chest tightness and wheezing.   Cardiovascular: Negative.  Negative  for chest pain, palpitations and leg swelling.  Gastrointestinal: Negative for abdominal pain, constipation, diarrhea, nausea and vomiting.  Endocrine: Negative.   Genitourinary: Negative.   Musculoskeletal: Negative.   Skin: Negative.  Negative for rash.  Allergic/Immunologic: Negative.   Neurological: Negative.  Negative for dizziness and weakness.  Hematological: Negative for adenopathy. Does not bruise/bleed easily.  Psychiatric/Behavioral: Negative.     Objective:  BP 112/80 (BP Location: Left Arm, Patient Position: Sitting, Cuff Size: Normal)   Pulse 78   Temp 97.7 F (36.5 C) (Oral)   Resp 16   Ht _0  (1.753 m)   Wt 205 lb 8 oz (93.2 kg)   SpO2 98%   BMI 30.35 kg/m   BP Readings from Last 3 Encounters:  03/02/17 112/80  02/14/17 (!) 146/74  12/19/16 (!) 148/88    Wt Readings from Last 3 Encounters:  03/02/17 205 lb 8 oz (93.2 kg)  02/14/17 208 lb 6.4 oz (94.5 kg)  12/19/16 208 lb (94.3 kg)    Physical Exam  Constitutional: He is oriented to person, place, and time. No distress.  HENT:  Mouth/Throat: Oropharynx is clear and moist. No oropharyngeal exudate.  Eyes: Conjunctivae are normal. Right eye exhibits no discharge. Left eye exhibits no discharge. No scleral icterus.  Neck: Normal range of motion. Neck supple. No JVD present. No thyromegaly present.  Cardiovascular: Normal rate, regular rhythm and intact distal pulses.  Exam reveals no gallop.   No murmur heard. Pulmonary/Chest: Effort normal. No accessory muscle usage. No tachypnea. No respiratory distress. He has no decreased breath sounds. He has no wheezes. He has rhonchi in the right lower field. He has no rales.  Abdominal: Soft. Bowel sounds are normal. He exhibits no distension and no mass. There is no tenderness. There is no rebound and no guarding.  Musculoskeletal: Normal range of motion. He exhibits no edema, tenderness or deformity.  Lymphadenopathy:    He has no cervical adenopathy.    Neurological: He is alert and oriented to person, place, and time.  Skin: Skin is warm and dry. No rash noted. He is not diaphoretic. No erythema. No pallor.  Vitals reviewed.   Lab Results  Component Value Date   WBC 11.2 (H) 03/02/2017   HGB 12.8 (L) 03/02/2017   HCT 37.5 (L) 03/02/2017   PLT 249.0 03/02/2017   GLUCOSE 97 03/02/2017   CHOL 88 10/31/2016   TRIG 99.0 10/31/2016   HDL 31.50 (L) 10/31/2016   LDLCALC 37 10/31/2016   ALT 14 10/31/2016   AST 18 10/31/2016   NA 141 03/02/2017   K 3.8 03/02/2017   CL 106 03/02/2017   CREATININE 1.34 03/02/2017   BUN 26 (H) 03/02/2017   CO2 29 03/02/2017   TSH 2.84 12/31/2015   INR 1.3 ratio (H) 08/11/2010   HGBA1C 6.8 (H) 03/02/2017   MICROALBUR <0.7 05/03/2016    Dg Chest 2 View  Result Date: 02/05/2016 CLINICAL DATA:  Melanoma. EXAM: CHEST  2 VIEW COMPARISON:  01/29/2015. FINDINGS: The cardiac silhouette, mediastinal and hilar contours are within normal limits for age and stable. There is mild tortuosity of the thoracic aorta. Stable basilar scarring changes. No infiltrates, edema or effusions. No worrisome pulmonary lesions. The bony thorax is intact. IMPRESSION: No acute cardiopulmonary findings no worrisome pulmonary lesions. Stable basilar scarring changes. Electronically Signed   By: Marijo Sanes M.D.   On: 02/05/2016 14:08    Assessment & Plan:   Tracie was seen today for cough.  Diagnoses and all orders for this visit:  B12 deficiency- Will continue B12 replacement therapy -     CBC with Differential/Platelet; Future  Essential hypertension- his blood pressure is well-controlled, electrolytes and renal function are normal. -     Basic metabolic panel; Future  Type 2 diabetes mellitus with complication, without long-term current use of insulin ([place])- his blood sugars adequately well controlled -     Hemoglobin A1c; Future  Obesity (BMI 30.0-34.9)- he agrees to work on his lifestyle modifications to lose  weight.  Cough- his exam is remarkable for rhonchi and his FeNO score is mildly elevated so I will treat for the combination of mild, persistent asthma and chronic bronchitis with COPD. -     POCT EXHALED NITRIC OXIDE  COPD with asthma ([place])- I think he would benefit from triple therapy so I gave him a sample of TRELEGY and showed him how to use it, he demonstrated proficiency with its use. -     Fluticasone-Umeclidin-Vilant (TRELEGY ELLIPTA) 100-62.5-25 MCG/INH AEPB; Inhale 1 puff into the lungs daily.   I am having Mr. Bernath start on Fluticasone-Umeclidin-Vilant. I am also having him maintain his aspirin, PRESERVISION/LUTEIN, FREESTYLE FREEDOM LITE, freestyle, LANTUS SOLOSTAR, cyanocobalamin, cholecalciferol, WELCHOL, atorvastatin, B-D ULTRAFINE III [place]ey PEN, FREESTYLE LITE, KOMBIGLYZE XR, and losartan-hydrochlorothiazide.  Meds ordered this encounter  Medications  . Fluticasone-Umeclidin-Vilant (TRELEGY ELLIPTA) 100-62.5-25  MCG/INH AEPB    Sig: Inhale 1 puff into the lungs daily.    Dispense:  30 each    Refill:  11     Follow-up: Return in about 4 weeks (around 03/30/2017).  Scarlette Calico, MD

## 2017-03-28 ENCOUNTER — Encounter: Payer: Self-pay | Admitting: Internal Medicine

## 2017-03-28 ENCOUNTER — Ambulatory Visit (INDEPENDENT_AMBULATORY_CARE_PROVIDER_SITE_OTHER): Payer: Medicare Other | Admitting: Internal Medicine

## 2017-03-28 VITALS — BP 128/70 | HR 75 | Temp 97.8°F | Resp 16 | Ht 69.0 in | Wt 207.2 lb

## 2017-03-28 DIAGNOSIS — J449 Chronic obstructive pulmonary disease, unspecified: Secondary | ICD-10-CM

## 2017-03-28 DIAGNOSIS — I10 Essential (primary) hypertension: Secondary | ICD-10-CM | POA: Diagnosis not present

## 2017-03-28 NOTE — Patient Instructions (Signed)
Chronic Obstructive Pulmonary Disease Chronic obstructive pulmonary disease (COPD) is a common lung condition in which airflow from the lungs is limited. COPD is a general term that can be used to describe many different lung problems that limit airflow, including both chronic bronchitis and emphysema. If you have COPD, your lung function will probably never return to normal, but there are measures you can take to improve lung function and make yourself feel better. What are the causes?  Smoking (common).  Exposure to secondhand smoke.  Genetic problems.  Chronic inflammatory lung diseases or recurrent infections. What are the signs or symptoms?  Shortness of breath, especially with physical activity.  Deep, persistent (chronic) cough with a large amount of thick mucus.  Wheezing.  Rapid breaths (tachypnea).  Gray or bluish discoloration (cyanosis) of the skin, especially in your fingers, toes, or lips.  Fatigue.  Weight loss.  Frequent infections or episodes when breathing symptoms become much worse (exacerbations).  Chest tightness. How is this diagnosed? Your health care provider will take a medical history and perform a physical examination to diagnose COPD. Additional tests for COPD may include:  Lung (pulmonary) function tests.  Chest X-ray.  CT scan.  Blood tests. How is this treated? Treatment for COPD may include:  Inhaler and nebulizer medicines. These help manage the symptoms of COPD and make your breathing more comfortable.  Supplemental oxygen. Supplemental oxygen is only helpful if you have a low oxygen level in your blood.  Exercise and physical activity. These are beneficial for nearly all people with COPD.  Lung surgery or transplant.  Nutrition therapy to gain weight, if you are underweight.  Pulmonary rehabilitation. This may involve working with a team of health care providers and specialists, such as respiratory, occupational, and physical  therapists. Follow these instructions at home:  Take all medicines (inhaled or pills) as directed by your health care provider.  Avoid over-the-counter medicines or cough syrups that dry up your airway (such as antihistamines) and slow down the elimination of secretions unless instructed otherwise by your health care provider.  If you are a smoker, the most important thing that you can do is stop smoking. Continuing to smoke will cause further lung damage and breathing trouble. Ask your health care provider for help with quitting smoking. He or she can direct you to community resources or hospitals that provide support.  Avoid exposure to irritants such as smoke, chemicals, and fumes that aggravate your breathing.  Use oxygen therapy and pulmonary rehabilitation if directed by your health care provider. If you require home oxygen therapy, ask your health care provider whether you should purchase a pulse oximeter to measure your oxygen level at home.  Avoid contact with individuals who have a contagious illness.  Avoid extreme temperature and humidity changes.  Eat healthy foods. Eating smaller, more frequent meals and resting before meals may help you maintain your strength.  Stay active, but balance activity with periods of rest. Exercise and physical activity will help you maintain your ability to do things you want to do.  Preventing infection and hospitalization is very important when you have COPD. Make sure to receive all the vaccines your health care provider recommends, especially the pneumococcal and influenza vaccines. Ask your health care provider whether you need a pneumonia vaccine.  Learn and use relaxation techniques to manage stress.  Learn and use controlled breathing techniques as directed by your health care provider. Controlled breathing techniques include: 1. Pursed lip breathing. Start by breathing in (inhaling)   through your nose for 1 second. Then, purse your lips as  if you were going to whistle and breathe out (exhale) through the pursed lips for 2 seconds. 2. Diaphragmatic breathing. Start by putting one hand on your abdomen just above your waist. Inhale slowly through your nose. The hand on your abdomen should move out. Then purse your lips and exhale slowly. You should be able to feel the hand on your abdomen moving in as you exhale.  Learn and use controlled coughing to clear mucus from your lungs. Controlled coughing is a series of [place], progressive coughs. The steps of controlled coughing are: 1. Lean your head slightly forward. 2. Breathe in deeply using diaphragmatic breathing. 3. Try to hold your breath for 3 seconds. 4. Keep your mouth slightly open while coughing twice. 5. Spit any mucus out into a tissue. 6. Rest and repeat the steps once or twice as needed. Contact a health care provider if:  You are coughing up more mucus than usual.  There is a change in the color or thickness of your mucus.  Your breathing is more labored than usual.  Your breathing is faster than usual. Get help right away if:  You have shortness of breath while you are resting.  You have shortness of breath that prevents you from:  Being able to talk.  Performing your usual physical activities.  You have chest pain lasting longer than 5 minutes.  Your skin color is more cyanotic than usual.  You measure low oxygen saturations for longer than 5 minutes with a pulse oximeter. This information is not intended to replace advice given to you by your health care provider. Make sure you discuss any questions you have with your health care provider. Document Released: 05/25/2005 Document Revised: 01/21/2016 Document Reviewed: 04/11/2013 Elsevier Interactive Patient Education  2017 [employer]  

## 2017-03-28 NOTE — Progress Notes (Signed)
Subjective:  Patient ID: Nicholas Burton, male    DOB: 1928-07-08  Age: 83 y.o. MRN: [id number]  CC: COPD and Asthma   HPI Nicholas Burton presents for f/up - His breathing is much better since he started using Trelegy. He has rare cough but it is nonproductive and he's had no recent episodes of wheezing or shortness of breath.  Outpatient Medications Prior to Visit  Medication Sig Dispense Refill  . aspirin 81 MG tablet Take 81 mg by mouth daily.      Marland Kitchen atorvastatin (LIPITOR) 10 MG tablet TAKE 1 TABLET DAILY 90 tablet 3  . B-D ULTRAFINE III [place] PEN 31G X 8 MM MISC USE TO CHECK BLOOD SUGAR UP TO THREE TIMES A DAY 300 each 3  . Blood Glucose Monitoring Suppl (FREESTYLE FREEDOM LITE) W/DEVICE KIT Use to check blood sugars daily Dx E11.8 1 each 0  . cholecalciferol (VITAMIN D) 1000 units tablet Take 1,000 Units by mouth daily.    . cyanocobalamin 1000 MCG tablet Take 1,000 mcg by mouth daily.    . Fluticasone-Umeclidin-Vilant (TRELEGY ELLIPTA) 100-62.5-25 MCG/INH AEPB Inhale 1 puff into the lungs daily. 30 each 11  . FREESTYLE LITE test strip USE TO CHECK BLOOD SUGAR TWICE A DAY 200 each 2  . KOMBIGLYZE XR 2.12-998 MG TB24 TAKE 2 TABLETS DAILY 180 tablet 1  . Lancets (FREESTYLE) lancets Use to check blood sugars twice day Dx E11.8 180 each 3  . LANTUS SOLOSTAR 100 UNIT/ML Solostar Pen INJECT 40 UNITS UNDER THE SKIN DAILY 45 mL 3  . losartan-hydrochlorothiazide (HYZAAR) 100-12.5 MG tablet TAKE 1 TABLET DAILY 90 tablet 1  . Multiple Vitamins-Minerals (PRESERVISION/LUTEIN) CAPS Take 1 capsule by mouth 2 (two) times daily.      . WELCHOL 3.75 g PACK MIX THE CONTENTS OF 1 PACKET IN FLUID AND DRINK DAILY 90 each 2   No facility-administered medications prior to visit.     ROS Review of Systems  Constitutional: Negative.  Negative for chills, diaphoresis and fever.  HENT: Negative.   Eyes: Negative for visual disturbance.  Respiratory: Positive for cough. Negative for chest tightness,  shortness of breath and wheezing.   Cardiovascular: Negative for chest pain, palpitations and leg swelling.  Gastrointestinal: Negative for abdominal pain, constipation, diarrhea and vomiting.  Genitourinary: Negative.  Negative for difficulty urinating.  Musculoskeletal: Negative.   Skin: Negative.   Neurological: Negative.  Negative for dizziness.  Hematological: Negative for adenopathy. Does not bruise/bleed easily.  Psychiatric/Behavioral: Negative.     Objective:  BP 128/70 (BP Location: Left Arm, Patient Position: Sitting, Cuff Size: Normal)   Pulse 75   Temp 97.8 F (36.6 C) (Oral)   Resp 16   Ht _0  (1.753 m)   Wt 207 lb 4 oz (94 kg)   SpO2 98%   BMI 30.61 kg/m   BP Readings from Last 3 Encounters:  03/28/17 128/70  03/02/17 112/80  02/14/17 (!) 146/74    Wt Readings from Last 3 Encounters:  03/28/17 207 lb 4 oz (94 kg)  03/02/17 205 lb 8 oz (93.2 kg)  02/14/17 208 lb 6.4 oz (94.5 kg)    Physical Exam  Constitutional: He is oriented to person, place, and time. No distress.  HENT:  Mouth/Throat: Oropharynx is clear and moist. No oropharyngeal exudate.  Eyes: Conjunctivae are normal. Right eye exhibits no discharge. Left eye exhibits no discharge. No scleral icterus.  Neck: Normal range of motion. Neck supple. No JVD present. No thyromegaly present.  Cardiovascular: Normal rate, regular rhythm and intact distal pulses.  Exam reveals no gallop.   No murmur heard. Pulmonary/Chest: Effort normal and breath sounds normal. No respiratory distress. He has no wheezes. He has no rales. He exhibits no tenderness.  Abdominal: Soft. Bowel sounds are normal. He exhibits no distension and no mass. There is no tenderness. There is no rebound and no guarding.  Musculoskeletal: Normal range of motion. He exhibits no edema, tenderness or deformity.  Lymphadenopathy:    He has no cervical adenopathy.  Neurological: He is alert and oriented to person, place, and time.  Skin:  Skin is warm and dry. No rash noted. He is not diaphoretic. No erythema. No pallor.  Vitals reviewed.   Lab Results  Component Value Date   WBC 11.2 (H) 03/02/2017   HGB 12.8 (L) 03/02/2017   HCT 37.5 (L) 03/02/2017   PLT 249.0 03/02/2017   GLUCOSE 97 03/02/2017   CHOL 88 10/31/2016   TRIG 99.0 10/31/2016   HDL 31.50 (L) 10/31/2016   LDLCALC 37 10/31/2016   ALT 14 10/31/2016   AST 18 10/31/2016   NA 141 03/02/2017   K 3.8 03/02/2017   CL 106 03/02/2017   CREATININE 1.34 03/02/2017   BUN 26 (H) 03/02/2017   CO2 29 03/02/2017   TSH 2.84 12/31/2015   INR 1.3 ratio (H) 08/11/2010   HGBA1C 6.8 (H) 03/02/2017   MICROALBUR <0.7 05/03/2016    Dg Chest 2 View  Result Date: 02/05/2016 CLINICAL DATA:  Melanoma. EXAM: CHEST  2 VIEW COMPARISON:  01/29/2015. FINDINGS: The cardiac silhouette, mediastinal and hilar contours are within normal limits for age and stable. There is mild tortuosity of the thoracic aorta. Stable basilar scarring changes. No infiltrates, edema or effusions. No worrisome pulmonary lesions. The bony thorax is intact. IMPRESSION: No acute cardiopulmonary findings no worrisome pulmonary lesions. Stable basilar scarring changes. Electronically Signed   By: Marijo Sanes M.D.   On: 02/05/2016 14:08    Assessment & Plan:   Nicholas was seen today for copd and asthma.  Diagnoses and all orders for this visit:  Essential hypertension- his BP is well controlled  COPD with asthma ([place])- improvement noted, will cont the triple therapy   I am having Mr. Burton maintain his aspirin, PRESERVISION/LUTEIN, FREESTYLE FREEDOM LITE, freestyle, LANTUS SOLOSTAR, cyanocobalamin, cholecalciferol, WELCHOL, atorvastatin, B-D ULTRAFINE III [place] PEN, FREESTYLE LITE, KOMBIGLYZE XR, losartan-hydrochlorothiazide, and Fluticasone-Umeclidin-Vilant.  No orders of the defined types were placed in this encounter.    Follow-up: Return in about 4 months (around 07/28/2017).  Scarlette Calico, MD

## 2017-04-03 ENCOUNTER — Other Ambulatory Visit: Payer: Self-pay | Admitting: Internal Medicine

## 2017-05-03 DIAGNOSIS — H353113 Nonexudative age-related macular degeneration, right eye, advanced atrophic without subfoveal involvement: Secondary | ICD-10-CM | POA: Diagnosis not present

## 2017-05-03 DIAGNOSIS — H43813 Vitreous degeneration, bilateral: Secondary | ICD-10-CM | POA: Diagnosis not present

## 2017-05-03 DIAGNOSIS — H353222 Exudative age-related macular degeneration, left eye, with inactive choroidal neovascularization: Secondary | ICD-10-CM | POA: Diagnosis not present

## 2017-05-25 DIAGNOSIS — L578 Other skin changes due to chronic exposure to nonionizing radiation: Secondary | ICD-10-CM | POA: Diagnosis not present

## 2017-06-02 ENCOUNTER — Other Ambulatory Visit: Payer: Self-pay | Admitting: Internal Medicine

## 2017-06-28 ENCOUNTER — Other Ambulatory Visit: Payer: Self-pay | Admitting: Internal Medicine

## 2017-06-28 DIAGNOSIS — E785 Hyperlipidemia, unspecified: Secondary | ICD-10-CM

## 2017-06-28 DIAGNOSIS — E118 Type 2 diabetes mellitus with unspecified complications: Secondary | ICD-10-CM

## 2017-07-17 DIAGNOSIS — L578 Other skin changes due to chronic exposure to nonionizing radiation: Secondary | ICD-10-CM | POA: Diagnosis not present

## 2017-07-31 ENCOUNTER — Ambulatory Visit (INDEPENDENT_AMBULATORY_CARE_PROVIDER_SITE_OTHER): Payer: Medicare Other | Admitting: Internal Medicine

## 2017-07-31 ENCOUNTER — Other Ambulatory Visit (INDEPENDENT_AMBULATORY_CARE_PROVIDER_SITE_OTHER): Payer: Medicare Other

## 2017-07-31 ENCOUNTER — Encounter: Payer: Self-pay | Admitting: Internal Medicine

## 2017-07-31 VITALS — BP 128/80 | HR 69 | Temp 97.8°F | Ht 69.0 in | Wt 206.0 lb

## 2017-07-31 DIAGNOSIS — I10 Essential (primary) hypertension: Secondary | ICD-10-CM

## 2017-07-31 DIAGNOSIS — E538 Deficiency of other specified B group vitamins: Secondary | ICD-10-CM | POA: Diagnosis not present

## 2017-07-31 DIAGNOSIS — J449 Chronic obstructive pulmonary disease, unspecified: Secondary | ICD-10-CM

## 2017-07-31 DIAGNOSIS — E118 Type 2 diabetes mellitus with unspecified complications: Secondary | ICD-10-CM

## 2017-07-31 LAB — BASIC METABOLIC PANEL
BUN: 21 mg/dL (ref 6–23)
CHLORIDE: 106 meq/L (ref 96–112)
CO2: 27 mEq/L (ref 19–32)
Calcium: 9.2 mg/dL (ref 8.4–10.5)
Creatinine, Ser: 1.21 mg/dL (ref 0.40–1.50)
GFR: 60.21 mL/min (ref >60.00)
Glucose, Bld: 79 mg/dL (ref 70–99)
POTASSIUM: 3.6 meq/L (ref 3.5–5.1)
SODIUM: 143 meq/L (ref 135–145)

## 2017-07-31 LAB — CBC WITH DIFFERENTIAL/PLATELET
BASOS PCT: 0.5 % (ref 0.0–3.0)
Basophils Absolute: 0.1 10*3/uL (ref 0.0–0.1)
EOS PCT: 3.1 % (ref 0.0–5.0)
Eosinophils Absolute: 0.3 10*3/uL (ref 0.0–0.7)
HEMATOCRIT: 39.7 % (ref 39.0–52.0)
HEMOGLOBIN: 13.2 g/dL (ref 13.0–17.0)
Lymphocytes Relative: 34.7 % (ref 12.0–46.0)
Lymphs Abs: 3.7 10*3/uL (ref 0.7–4.0)
MCHC: 33.4 g/dL (ref 30.0–36.0)
MCV: 86.5 fl (ref 78.0–100.0)
MONOS PCT: 9.1 % (ref 3.0–12.0)
Monocytes Absolute: 1 10*3/uL (ref 0.1–1.0)
Neutro Abs: 5.6 10*3/uL (ref 1.4–7.7)
Neutrophils Relative %: 52.6 % (ref 43.0–77.0)
Platelets: 296 10*3/uL (ref 150.0–400.0)
RBC: 4.59 Mil/uL (ref 4.22–5.81)
RDW: 14.1 % (ref 11.5–15.5)
WBC: 10.6 10*3/uL — AB (ref 4.0–10.5)

## 2017-07-31 LAB — HEMOGLOBIN A1C: Hgb A1c MFr Bld: 6.9 % — ABNORMAL HIGH (ref 4.6–6.5)

## 2017-07-31 MED ORDER — FLUTICASONE-UMECLIDIN-VILANT 100-62.5-25 MCG/INH IN AEPB: 1.0000 | INHALATION_SPRAY | Freq: Every day | RESPIRATORY_TRACT | 1 refills | Status: DC

## 2017-07-31 NOTE — Patient Instructions (Signed)

## 2017-07-31 NOTE — Progress Notes (Signed)
Subjective:  Patient ID: Nicholas Burton, male    DOB: 04/12/1928  Age: 83 y.o. MRN: [id number]  CC: Hypertension; Diabetes; and Anemia   HPI Nicholas Burton presents for f/up -he feels well and offers no complaints.  Outpatient Medications Prior to Visit  Medication Sig Dispense Refill  . aspirin 81 MG tablet Take 81 mg by mouth daily.      Marland Kitchen atorvastatin (LIPITOR) 10 MG tablet TAKE 1 TABLET DAILY 90 tablet 1  . B-D ULTRAFINE III [place] PEN 31G X 8 MM MISC USE TO CHECK BLOOD SUGAR UP TO THREE TIMES A DAY 300 each 3  . Blood Glucose Monitoring Suppl (FREESTYLE FREEDOM LITE) W/DEVICE KIT Use to check blood sugars daily Dx E11.8 1 each 0  . cholecalciferol (VITAMIN D) 1000 units tablet Take 1,000 Units by mouth daily.    . cyanocobalamin 1000 MCG tablet Take 1,000 mcg by mouth daily.    Marland Kitchen FREESTYLE LITE test strip USE TO CHECK BLOOD SUGAR TWICE A DAY 200 each 2  . Insulin Glargine (LANTUS SOLOSTAR) 100 UNIT/ML Solostar Pen Inject 40 Units into the skin daily. 45 mL 3  . KOMBIGLYZE XR 2.12-998 MG TB24 TAKE 2 TABLETS DAILY 180 tablet 1  . Lancets (FREESTYLE) lancets Use to check blood sugars twice day Dx E11.8 180 each 3  . losartan-hydrochlorothiazide (HYZAAR) 100-12.5 MG tablet TAKE 1 TABLET DAILY 90 tablet 1  . Multiple Vitamins-Minerals (PRESERVISION/LUTEIN) CAPS Take 1 capsule by mouth 2 (two) times daily.      . WELCHOL 3.75 g PACK MIX THE CONTENTS OF 1 PACKET IN FLUID AND DRINK DAILY 90 each 2  . Fluticasone-Umeclidin-Vilant (TRELEGY ELLIPTA) 100-62.5-25 MCG/INH AEPB Inhale 1 puff into the lungs daily. 30 each 11   No facility-administered medications prior to visit.     ROS Review of Systems  Constitutional: Negative.  Negative for activity change, appetite change, diaphoresis and fatigue.  HENT: Negative.   Eyes: Negative.   Respiratory: Negative.  Negative for cough, chest tightness, shortness of breath and wheezing.   Cardiovascular: Negative.  Negative for chest pain and leg  swelling.  Gastrointestinal: Negative for abdominal pain, constipation, diarrhea, nausea and vomiting.  Endocrine: Negative.   Genitourinary: Negative.  Negative for difficulty urinating.  Musculoskeletal: Negative.   Skin: Negative.   Allergic/Immunologic: Negative.   Neurological: Negative.  Negative for dizziness.  Hematological: Negative for adenopathy. Does not bruise/bleed easily.  Psychiatric/Behavioral: Negative.     Objective:  BP 128/80 (BP Location: Left Arm, Patient Position: Sitting, Cuff Size: Normal)   Pulse 69   Temp 97.8 F (36.6 C) (Oral)   Ht 5' 9"  (1.753 m)   Wt 206 lb (93.4 kg)   SpO2 100%   BMI 30.42 kg/m   BP Readings from Last 3 Encounters:  07/31/17 128/80  03/28/17 128/70  03/02/17 112/80    Wt Readings from Last 3 Encounters:  07/31/17 206 lb (93.4 kg)  03/28/17 207 lb 4 oz (94 kg)  03/02/17 205 lb 8 oz (93.2 kg)    Physical Exam  Constitutional: He is oriented to person, place, and time. No distress.  HENT:  Mouth/Throat: Oropharynx is clear and moist. No oropharyngeal exudate.  Eyes: Conjunctivae are normal. Right eye exhibits no discharge. Left eye exhibits no discharge. No scleral icterus.  Neck: Normal range of motion. Neck supple. No JVD present. No thyromegaly present.  Cardiovascular: Normal rate, regular rhythm, normal heart sounds and intact distal pulses. Exam reveals no gallop.  No murmur  heard. Pulmonary/Chest: Effort normal and breath sounds normal. No respiratory distress. He has no wheezes. He has no rales. He exhibits no tenderness.  Abdominal: Soft. Bowel sounds are normal. He exhibits no mass. There is no tenderness. There is no guarding.  Musculoskeletal: Normal range of motion. He exhibits no edema, tenderness or deformity.  Lymphadenopathy:    He has no cervical adenopathy.  Neurological: He is alert and oriented to person, place, and time.  Skin: Skin is warm and dry. He is not diaphoretic.  Vitals reviewed.   Lab  Results  Component Value Date   WBC 10.6 (H) 07/31/2017   HGB 13.2 07/31/2017   HCT 39.7 07/31/2017   PLT 296.0 07/31/2017   GLUCOSE 79 07/31/2017   CHOL 88 10/31/2016   TRIG 99.0 10/31/2016   HDL 31.50 (L) 10/31/2016   LDLCALC 37 10/31/2016   ALT 14 10/31/2016   AST 18 10/31/2016   NA 143 07/31/2017   K 3.6 07/31/2017   CL 106 07/31/2017   CREATININE 1.21 07/31/2017   BUN 21 07/31/2017   CO2 27 07/31/2017   TSH 2.84 12/31/2015   INR 1.3 ratio (H) 08/11/2010   HGBA1C 6.9 (H) 07/31/2017   MICROALBUR <0.7 05/03/2016    Dg Chest 2 View  Result Date: 02/05/2016 CLINICAL DATA:  Melanoma. EXAM: CHEST  2 VIEW COMPARISON:  01/29/2015. FINDINGS: The cardiac silhouette, mediastinal and hilar contours are within normal limits for age and stable. There is mild tortuosity of the thoracic aorta. Stable basilar scarring changes. No infiltrates, edema or effusions. No worrisome pulmonary lesions. The bony thorax is intact. IMPRESSION: No acute cardiopulmonary findings no worrisome pulmonary lesions. Stable basilar scarring changes. Electronically Signed   By: Marijo Sanes M.D.   On: 02/05/2016 14:08    Assessment & Plan:   Nicholas Burton was seen today for hypertension, diabetes and anemia.  Diagnoses and all orders for this visit:  B12 deficiency- His H&H are normal.  Will continue the oral B12 supplement. -     CBC with Differential/Platelet; Future  Essential hypertension- His blood pressure is well controlled.  Electrolytes and renal function are normal. -     Basic metabolic panel; Future  Type 2 diabetes mellitus with complication, without long-term current use of insulin ([place])- His A1c is at 6.9%.  His blood sugars are adequately well controlled. -     Hemoglobin A1c; Future  COPD with asthma ([place])- He is doing well on the triple therapy.  Will continue trelegy. -     Fluticasone-Umeclidin-Vilant (TRELEGY ELLIPTA) 100-62.5-25 MCG/INH AEPB; Inhale 1 puff into the lungs daily.   I am  having Nicholas Burton. Nicholas Burton maintain his aspirin, PRESERVISION/LUTEIN, FREESTYLE FREEDOM LITE, freestyle, cyanocobalamin, cholecalciferol, B-D ULTRAFINE III [place] PEN, FREESTYLE LITE, KOMBIGLYZE XR, losartan-hydrochlorothiazide, WELCHOL, Insulin Glargine, atorvastatin, and Fluticasone-Umeclidin-Vilant.  Meds ordered this encounter  Medications  . Fluticasone-Umeclidin-Vilant (TRELEGY ELLIPTA) 100-62.5-25 MCG/INH AEPB    Sig: Inhale 1 puff into the lungs daily.    Dispense:  90 each    Refill:  1     Follow-up: Return in about 4 months (around 11/29/2017).  Scarlette Calico, MD

## 2017-08-01 DIAGNOSIS — H353222 Exudative age-related macular degeneration, left eye, with inactive choroidal neovascularization: Secondary | ICD-10-CM | POA: Diagnosis not present

## 2017-08-08 ENCOUNTER — Other Ambulatory Visit: Payer: Self-pay | Admitting: Internal Medicine

## 2017-08-15 IMAGING — CR DG CHEST 2V
2 series · 2 of 2 positions shown · non-contrast
Comparison: 01/29/2015.

CLINICAL DATA: Melanoma.

EXAM:
CHEST  2 VIEW

[w chest pa]
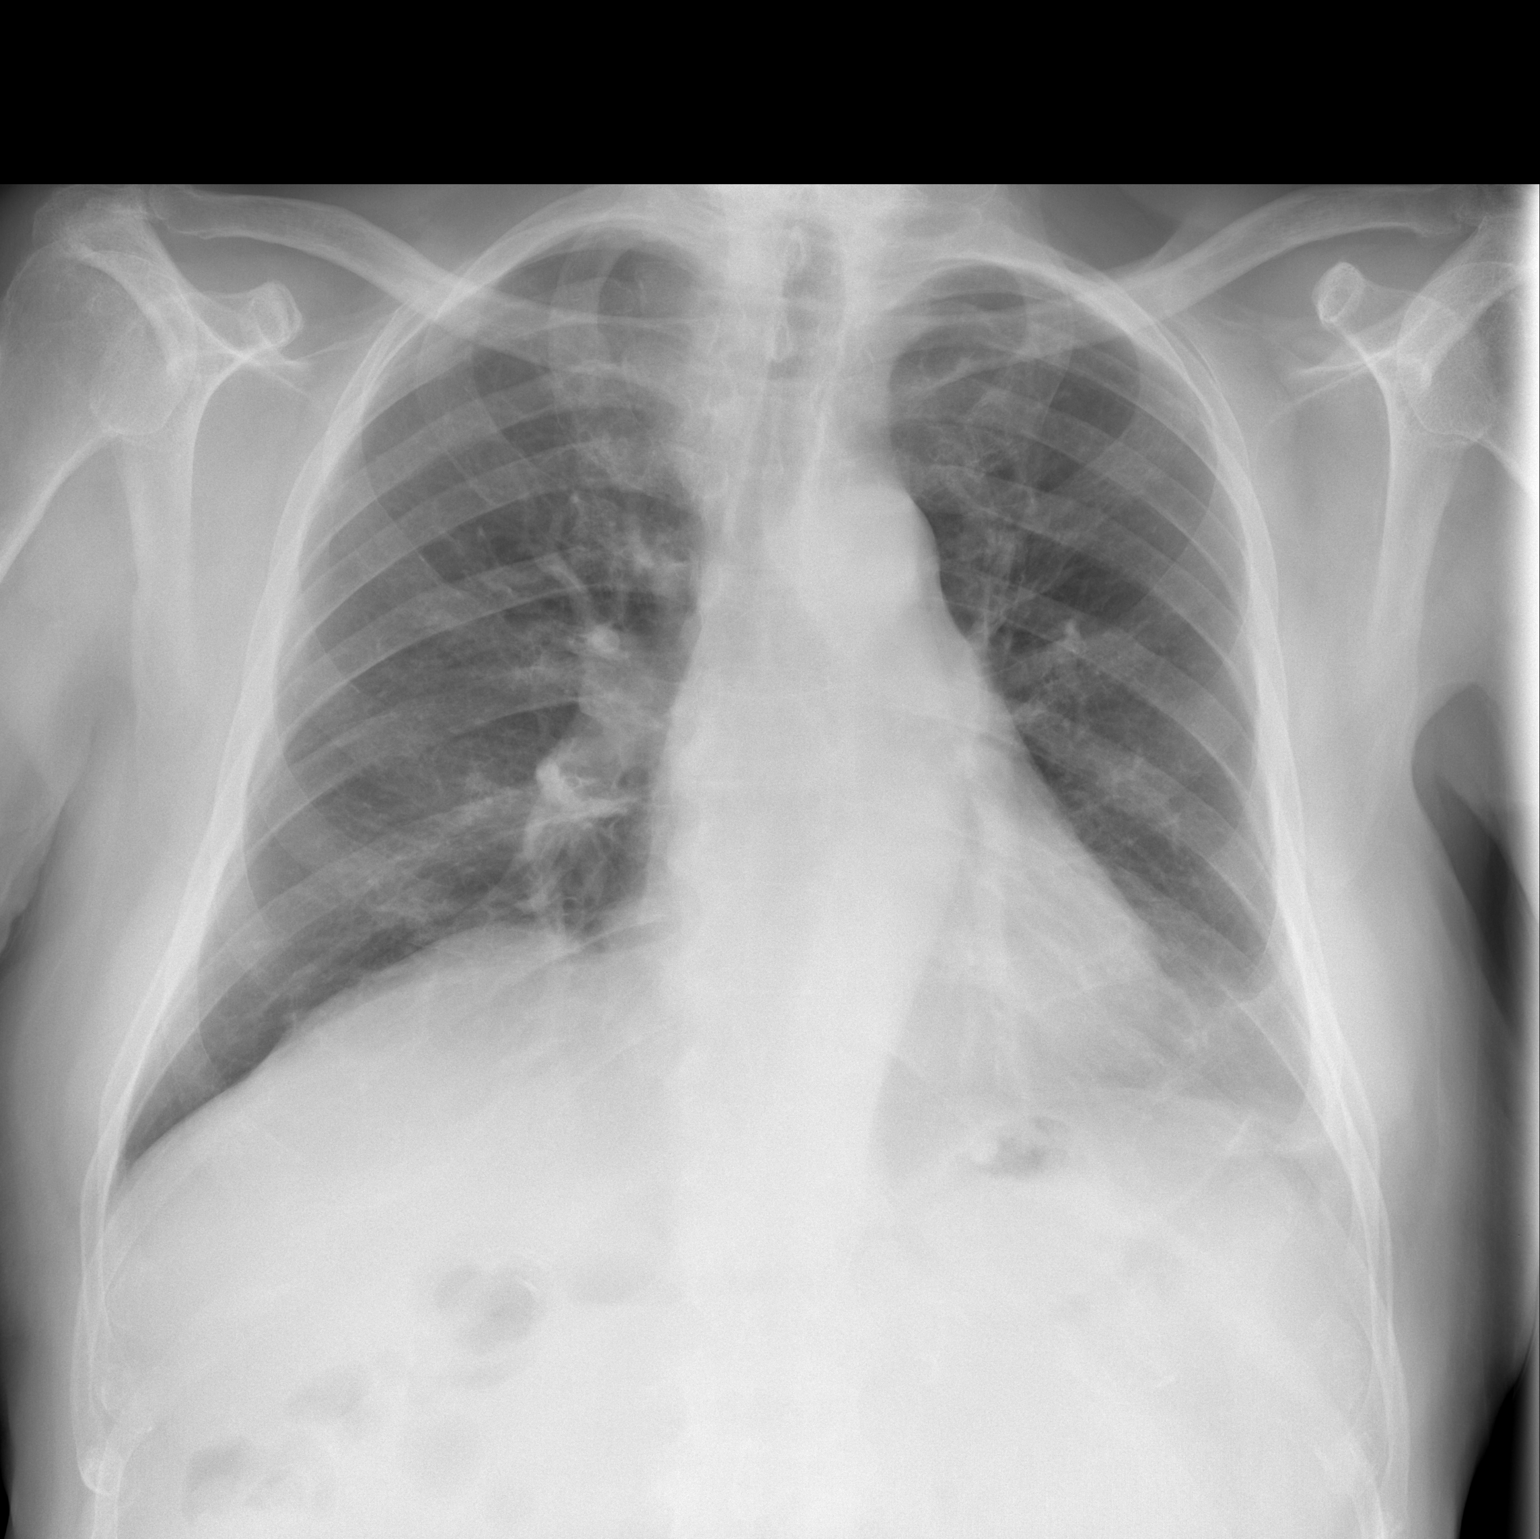

[w chest lat]
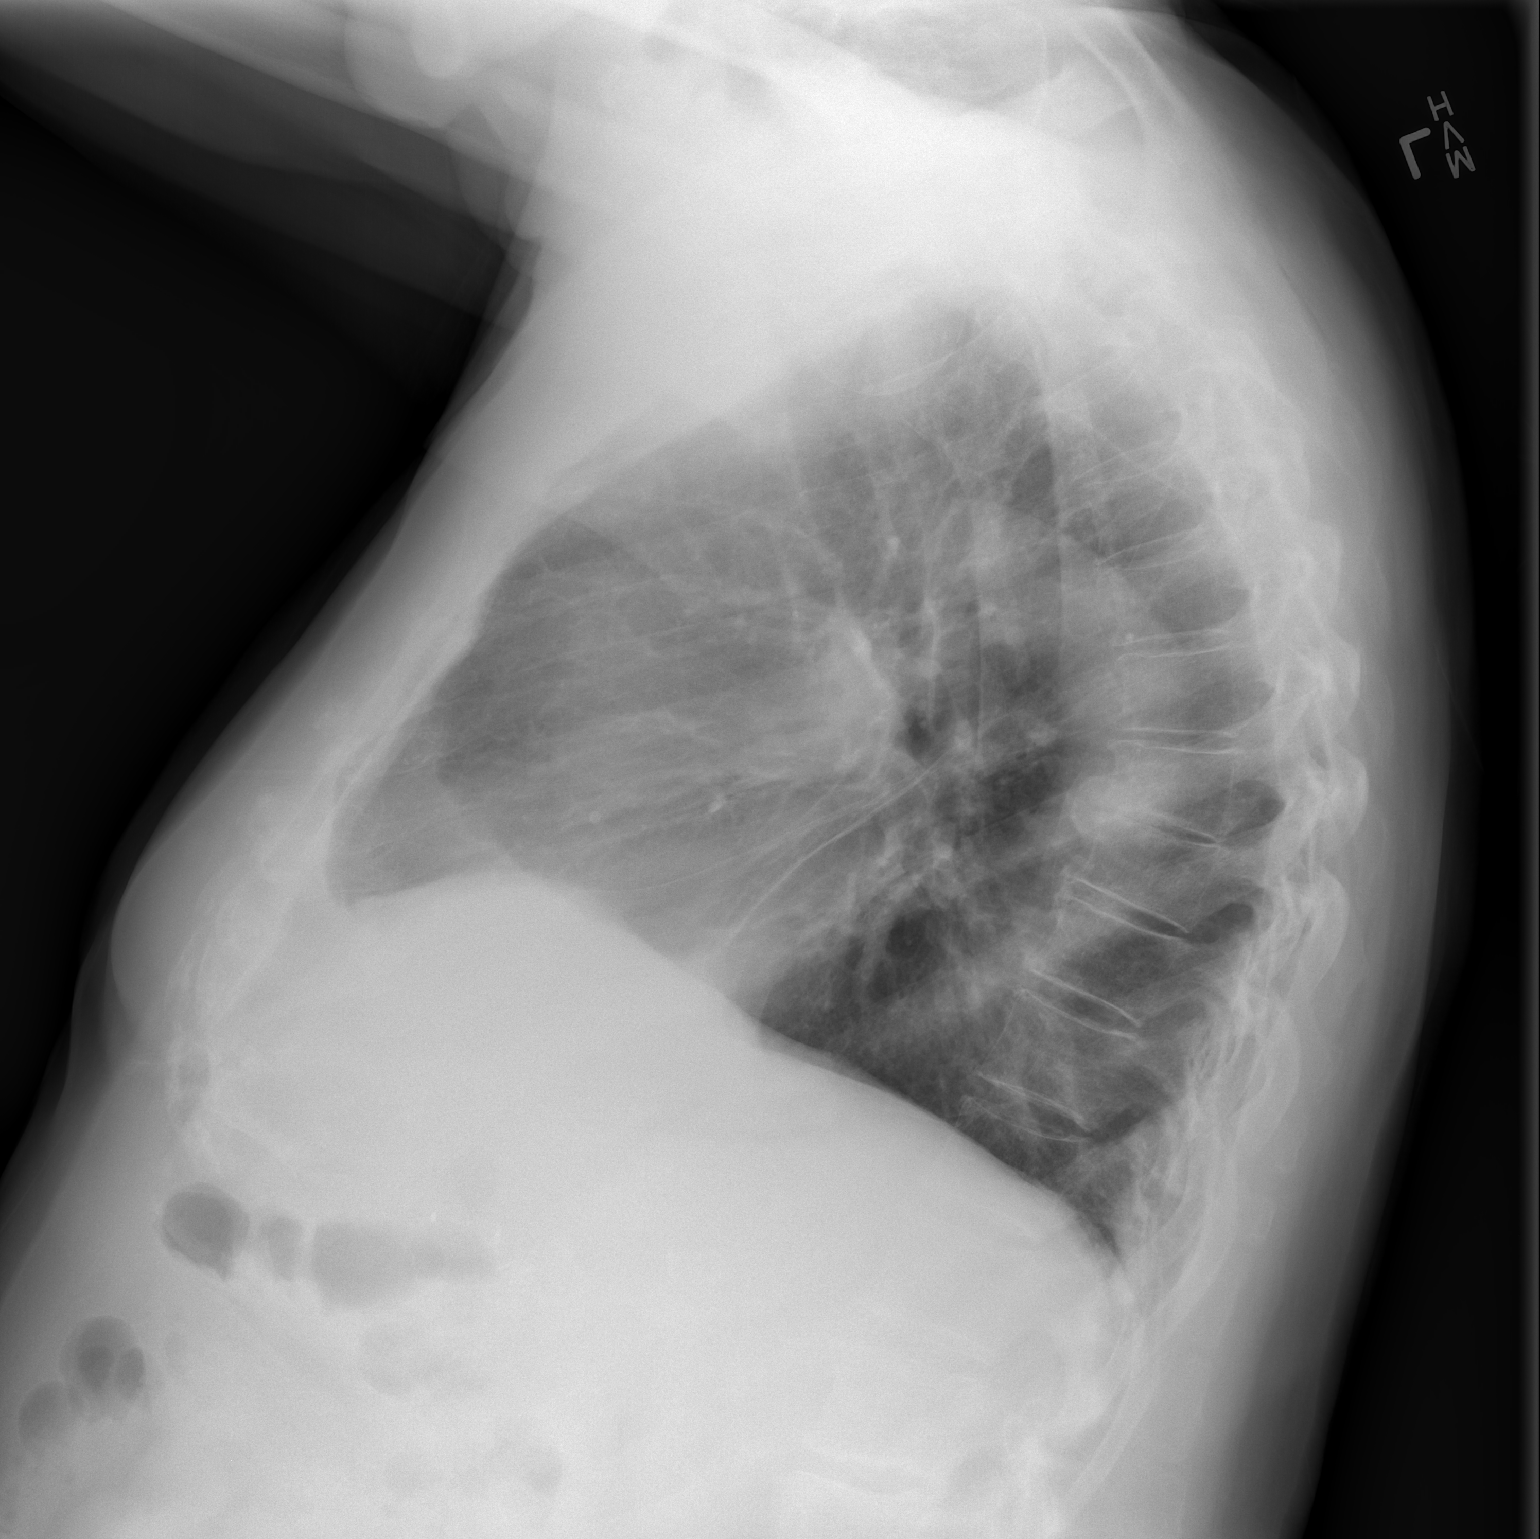

[2 of 2 positions shown; findings below may reference images not displayed]

FINDINGS: The cardiac silhouette, mediastinal and hilar contours are within
normal limits for age and stable. There is mild tortuosity of the
thoracic aorta. Stable basilar scarring changes. No infiltrates,
edema or effusions. No worrisome pulmonary lesions. The bony thorax
is intact.
IMPRESSION: No acute cardiopulmonary findings no worrisome pulmonary lesions.
Stable basilar scarring changes.

## 2017-08-25 ENCOUNTER — Other Ambulatory Visit: Payer: Self-pay | Admitting: Internal Medicine

## 2017-10-19 ENCOUNTER — Other Ambulatory Visit: Payer: Self-pay | Admitting: Internal Medicine

## 2017-10-30 ENCOUNTER — Other Ambulatory Visit (INDEPENDENT_AMBULATORY_CARE_PROVIDER_SITE_OTHER): Payer: Medicare Other

## 2017-10-30 ENCOUNTER — Ambulatory Visit (INDEPENDENT_AMBULATORY_CARE_PROVIDER_SITE_OTHER): Payer: Medicare Other | Admitting: Internal Medicine

## 2017-10-30 ENCOUNTER — Encounter: Payer: Self-pay | Admitting: Internal Medicine

## 2017-10-30 VITALS — BP 110/70 | HR 76 | Temp 97.8°F | Resp 16 | Ht 69.0 in | Wt 205.8 lb

## 2017-10-30 DIAGNOSIS — E785 Hyperlipidemia, unspecified: Secondary | ICD-10-CM

## 2017-10-30 DIAGNOSIS — I10 Essential (primary) hypertension: Secondary | ICD-10-CM

## 2017-10-30 DIAGNOSIS — E118 Type 2 diabetes mellitus with unspecified complications: Secondary | ICD-10-CM

## 2017-10-30 DIAGNOSIS — N400 Enlarged prostate without lower urinary tract symptoms: Secondary | ICD-10-CM

## 2017-10-30 LAB — URINALYSIS, ROUTINE W REFLEX MICROSCOPIC
Bilirubin Urine: NEGATIVE
HGB URINE DIPSTICK: NEGATIVE
Ketones, ur: NEGATIVE
Leukocytes, UA: NEGATIVE
NITRITE: NEGATIVE
RBC / HPF: NONE SEEN (ref 0–?)
Specific Gravity, Urine: 1.025 (ref 1.000–1.030)
TOTAL PROTEIN, URINE-UPE24: NEGATIVE
Urine Glucose: NEGATIVE
Urobilinogen, UA: 0.2 (ref 0.0–1.0)
pH: 5.5 (ref 5.0–8.0)

## 2017-10-30 LAB — CBC WITH DIFFERENTIAL/PLATELET
BASOS ABS: 0 10*3/uL (ref 0.0–0.1)
Basophils Relative: 0.4 % (ref 0.0–3.0)
Eosinophils Absolute: 0.2 10*3/uL (ref 0.0–0.7)
Eosinophils Relative: 2.2 % (ref 0.0–5.0)
HEMATOCRIT: 37.3 % — AB (ref 39.0–52.0)
HEMOGLOBIN: 12.9 g/dL — AB (ref 13.0–17.0)
Lymphocytes Relative: 37.7 % (ref 12.0–46.0)
Lymphs Abs: 3.4 10*3/uL (ref 0.7–4.0)
MCHC: 34.7 g/dL (ref 30.0–36.0)
MCV: 84.2 fl (ref 78.0–100.0)
MONOS PCT: 12.1 % — AB (ref 3.0–12.0)
Monocytes Absolute: 1.1 10*3/uL — ABNORMAL HIGH (ref 0.1–1.0)
NEUTROS PCT: 47.6 % (ref 43.0–77.0)
Neutro Abs: 4.3 10*3/uL (ref 1.4–7.7)
Platelets: 253 10*3/uL (ref 150.0–400.0)
RBC: 4.43 Mil/uL (ref 4.22–5.81)
RDW: 14.4 % (ref 11.5–15.5)
WBC: 9 10*3/uL (ref 4.0–10.5)

## 2017-10-30 LAB — COMPREHENSIVE METABOLIC PANEL
ALBUMIN: 3.5 g/dL (ref 3.5–5.2)
ALK PHOS: 118 U/L — AB (ref 39–117)
ALT: 22 U/L (ref 0–53)
AST: 27 U/L (ref 0–37)
BILIRUBIN TOTAL: 0.4 mg/dL (ref 0.2–1.2)
BUN: 27 mg/dL — ABNORMAL HIGH (ref 6–23)
CALCIUM: 9 mg/dL (ref 8.4–10.5)
CO2: 26 mEq/L (ref 19–32)
Chloride: 107 mEq/L (ref 96–112)
Creatinine, Ser: 1.44 mg/dL (ref 0.40–1.50)
GFR: 49.23 mL/min — AB (ref >60.00)
GLUCOSE: 87 mg/dL (ref 70–99)
Potassium: 3.8 mEq/L (ref 3.5–5.1)
Sodium: 141 mEq/L (ref 135–145)
TOTAL PROTEIN: 5.9 g/dL — AB (ref 6.0–8.3)

## 2017-10-30 LAB — THYROID PANEL WITH TSH
FREE THYROXINE INDEX: 2.5 (ref 1.4–3.8)
T3 UPTAKE: 32 % (ref 22–35)
T4, Total: 7.9 ug/dL (ref 4.9–10.5)
TSH: 3.93 m[IU]/L (ref 0.40–4.50)

## 2017-10-30 LAB — LIPID PANEL
CHOL/HDL RATIO: 2
Cholesterol: 67 mg/dL (ref 0–200)
HDL: 29.4 mg/dL — ABNORMAL LOW (ref >39.00)
LDL Cholesterol: 25 mg/dL (ref 0–99)
NONHDL: 37.52
Triglycerides: 63 mg/dL (ref 0.0–149.0)
VLDL: 12.6 mg/dL (ref 0.0–40.0)

## 2017-10-30 LAB — MICROALBUMIN / CREATININE URINE RATIO
Creatinine,U: 153.3 mg/dL
MICROALB/CREAT RATIO: 0.5 mg/g (ref 0.0–30.0)

## 2017-10-30 LAB — HEMOGLOBIN A1C: Hgb A1c MFr Bld: 6.9 % — ABNORMAL HIGH (ref 4.6–6.5)

## 2017-10-30 LAB — PSA: PSA: 11.21 ng/mL — ABNORMAL HIGH (ref 0.10–4.00)

## 2017-10-30 MED ORDER — ALFUZOSIN HCL ER 10 MG PO TB24: 10.0000 mg | ORAL_TABLET | Freq: Every day | ORAL | 1 refills | Status: DC

## 2017-10-30 NOTE — Progress Notes (Signed)
Subjective:  Patient ID: Nicholas Burton, male    DOB: 1928-04-16  Age: 84 y.o. MRN: [id number]  CC: Hypertension; Diabetes; and Hyperlipidemia   HPI RIK WADEL presents for f/up - He complains of worsening nocturia and urinary hesitancy.  He says he gets up about 3-4 times a night to urinate.  He denies pelvic pain, dysuria, or hematuria.  He tells me his blood sugar and blood pressures have been well controlled.  He denies any recent episodes of chest pain or shortness of breath.  Outpatient Medications Prior to Visit  Medication Sig Dispense Refill  . aspirin 81 MG tablet Take 81 mg by mouth daily.      Marland Kitchen atorvastatin (LIPITOR) 10 MG tablet TAKE 1 TABLET DAILY 90 tablet 1  . B-D ULTRAFINE III [place] PEN 31G X 8 MM MISC USE TO CHECK BLOOD SUGAR UP TO THREE TIMES A DAY 300 each 3  . Blood Glucose Monitoring Suppl (FREESTYLE FREEDOM LITE) W/DEVICE KIT Use to check blood sugars daily Dx E11.8 1 each 0  . cholecalciferol (VITAMIN D) 1000 units tablet Take 1,000 Units by mouth daily.    . cyanocobalamin 1000 MCG tablet Take 1,000 mcg by mouth daily.    Marland Kitchen FREESTYLE LITE test strip USE TO CHECK BLOOD SUGAR TWICE A DAY 200 each 2  . Insulin Glargine (LANTUS SOLOSTAR) 100 UNIT/ML Solostar Pen Inject 40 Units into the skin daily. 45 mL 3  . KOMBIGLYZE XR 2.12-998 MG TB24 TAKE 2 TABLETS DAILY 180 tablet 1  . Lancets (FREESTYLE) lancets Use to check blood sugars twice day Dx E11.8 180 each 3  . losartan-hydrochlorothiazide (HYZAAR) 100-12.5 MG tablet TAKE 1 TABLET DAILY 90 tablet 1  . Multiple Vitamins-Minerals (PRESERVISION/LUTEIN) CAPS Take 1 capsule by mouth 2 (two) times daily.      . WELCHOL 3.75 g PACK MIX THE CONTENTS OF 1 PACKET IN FLUID AND DRINK DAILY 90 each 2  . Fluticasone-Umeclidin-Vilant (TRELEGY ELLIPTA) 100-62.5-25 MCG/INH AEPB Inhale 1 puff into the lungs daily. 90 each 1   No facility-administered medications prior to visit.     ROS Review of Systems  Constitutional:  Negative.  Negative for appetite change, diaphoresis, fatigue and unexpected weight change.  HENT: Negative.   Eyes: Negative for visual disturbance.  Respiratory: Negative for cough, chest tightness, shortness of breath and wheezing.   Cardiovascular: Negative for chest pain, palpitations and leg swelling.  Gastrointestinal: Negative for abdominal pain, diarrhea, nausea and vomiting.  Endocrine: Negative.   Genitourinary: Positive for difficulty urinating. Negative for decreased urine volume, dysuria, flank pain, frequency, hematuria and urgency.  Musculoskeletal: Negative.  Negative for arthralgias, back pain, myalgias and neck pain.  Skin: Negative.  Negative for color change and rash.  Allergic/Immunologic: Negative.   Neurological: Negative.  Negative for dizziness, weakness, light-headedness and numbness.  Hematological: Negative for adenopathy. Does not bruise/bleed easily.  Psychiatric/Behavioral: Negative.     Objective:  BP 110/70 (BP Location: Left Arm, Patient Position: Sitting, Cuff Size: Large)   Pulse 76   Temp 97.8 F (36.6 C) (Oral)   Resp 16   Ht _0  (1.753 m)   Wt 205 lb 12 oz (93.3 kg)   SpO2 97%   BMI 30.38 kg/m   BP Readings from Last 3 Encounters:  10/30/17 110/70  07/31/17 128/80  03/28/17 128/70    Wt Readings from Last 3 Encounters:  10/30/17 205 lb 12 oz (93.3 kg)  07/31/17 206 lb (93.4 kg)  03/28/17 207 lb 4  oz (94 kg)    Physical Exam  Constitutional: He is oriented to person, place, and time. No distress.  HENT:  Mouth/Throat: Oropharynx is clear and moist. No oropharyngeal exudate.  Eyes: Conjunctivae are normal. Left eye exhibits no discharge. No scleral icterus.  Neck: Normal range of motion. Neck supple. No JVD present. No thyromegaly present.  Cardiovascular: Normal rate, regular rhythm and normal heart sounds. Exam reveals no gallop.  No murmur heard. Pulmonary/Chest: Effort normal and breath sounds normal. No respiratory distress.  He has no wheezes. He has no rales.  Abdominal: Soft. Bowel sounds are normal. He exhibits no distension and no mass. There is no tenderness. There is no guarding.  Musculoskeletal: Normal range of motion. He exhibits no edema or tenderness.  Lymphadenopathy:    He has no cervical adenopathy.  Neurological: He is alert and oriented to person, place, and time.  Skin: Skin is warm and dry. No rash noted. He is not diaphoretic. No erythema. No pallor.  Vitals reviewed.   Lab Results  Component Value Date   WBC 9.0 10/30/2017   HGB 12.9 (L) 10/30/2017   HCT 37.3 (L) 10/30/2017   PLT 253.0 10/30/2017   GLUCOSE 87 10/30/2017   CHOL 67 10/30/2017   TRIG 63.0 10/30/2017   HDL 29.40 (L) 10/30/2017   LDLCALC 25 10/30/2017   ALT 22 10/30/2017   AST 27 10/30/2017   NA 141 10/30/2017   K 3.8 10/30/2017   CL 107 10/30/2017   CREATININE 1.44 10/30/2017   BUN 27 (H) 10/30/2017   CO2 26 10/30/2017   TSH 3.93 10/30/2017   PSA 11.21 (H) 10/30/2017   INR 1.3 ratio (H) 08/11/2010   HGBA1C 6.9 (H) 10/30/2017   MICROALBUR <0.7 10/30/2017    Dg Chest 2 View  Result Date: 02/05/2016 CLINICAL DATA:  Melanoma. EXAM: CHEST  2 VIEW COMPARISON:  01/29/2015. FINDINGS: The cardiac silhouette, mediastinal and hilar contours are within normal limits for age and stable. There is mild tortuosity of the thoracic aorta. Stable basilar scarring changes. No infiltrates, edema or effusions. No worrisome pulmonary lesions. The bony thorax is intact. IMPRESSION: No acute cardiopulmonary findings no worrisome pulmonary lesions. Stable basilar scarring changes. Electronically Signed   By: Marijo Sanes M.D.   On: 02/05/2016 14:08    Assessment & Plan:   Hammad was seen today for hypertension, diabetes and hyperlipidemia.  Diagnoses and all orders for this visit:  Essential hypertension- His blood pressure is well controlled.  Electrolytes and renal function are normal. -     Comprehensive metabolic panel; Future -      CBC with Differential/Platelet; Future -     Thyroid Panel With TSH; Future -     Microalbumin / creatinine urine ratio; Future  Type 2 diabetes mellitus with complication, without long-term current use of insulin ([place])- His A1c is at 6.9%.  His blood sugars are adequately well controlled. -     Comprehensive metabolic panel; Future -     Hemoglobin A1c; Future  Benign prostatic hyperplasia without lower urinary tract symptoms- Exam deferred at his request today.  His PSA is elevated.  I have asked to return in a few months for me to recheck of his PSA.  In the meantime will start a peripheral alpha blocker to improve his symptoms. -     alfuzosin (UROXATRAL) 10 MG 24 hr tablet; Take 1 tablet (10 mg total) by mouth daily with breakfast. -     PSA; Future -  Urinalysis, Routine w reflex microscopic; Future  Hyperlipidemia with target LDL less than 100- He has achieved his LDL goal and is doing well on the statin. -     Lipid panel; Future -     Thyroid Panel With TSH; Future   I have discontinued [name]. Musgrave's Fluticasone-Umeclidin-Vilant. I am also having him start on alfuzosin. Additionally, I am having him maintain his aspirin, PRESERVISION/LUTEIN, FREESTYLE FREEDOM LITE, freestyle, cyanocobalamin, cholecalciferol, B-D ULTRAFINE III [place] PEN, WELCHOL, Insulin Glargine, atorvastatin, KOMBIGLYZE XR, losartan-hydrochlorothiazide, and FREESTYLE LITE.  Meds ordered this encounter  Medications  . alfuzosin (UROXATRAL) 10 MG 24 hr tablet    Sig: Take 1 tablet (10 mg total) by mouth daily with breakfast.    Dispense:  90 tablet    Refill:  1     Follow-up: Return in about 6 months (around 05/02/2018).  Scarlette Calico, MD

## 2017-10-30 NOTE — Patient Instructions (Signed)
Benign Prostatic Hyperplasia  Benign prostatic hyperplasia (BPH) is an enlarged prostate gland that is caused by the normal aging process and not by cancer. The prostate is a walnut-sized gland that is involved in the production of semen. It is located in front of the rectum and below the bladder. The bladder stores urine and the urethra is the tube that carries the urine out of the body. The prostate may get bigger as a man gets older.  An enlarged prostate can press on the urethra. This can make it harder to pass urine. The build-up of urine in the bladder can cause infection. Back pressure and infection may progress to bladder damage and kidney (renal) failure.  What are the causes?  This condition is part of a normal aging process. However, not all men develop problems from this condition. If the prostate enlarges away from the urethra, urine flow will not be blocked. If it enlarges toward the urethra and compresses it, there will be problems passing urine.  What increases the risk?  This condition is more likely to develop in men over the age of 50 years.  What are the signs or symptoms?  Symptoms of this condition include:  · Getting up often during the night to urinate.  · Needing to urinate frequently during the day.  · Difficulty starting urine flow.  · Decrease in size and strength of your urine stream.  · Leaking (dribbling) after urinating.  · Inability to pass urine. This needs immediate treatment.  · Inability to completely empty your bladder.  · Pain when you pass urine. This is more common if there is also an infection.  · Urinary tract infection (UTI).    How is this diagnosed?  This condition is diagnosed based on your medical history, a physical exam, and your symptoms. Tests will also be done, such as:  · A post-void bladder scan. This measures any amount of urine that may remain in your bladder after you finish urinating.  · A digital rectal exam. In a rectal exam, your health care provider  checks your prostate by putting a lubricated, gloved finger into your rectum to feel the back of your prostate gland. This exam detects the size of your gland and any abnormal lumps or growths.  · An exam of your urine (urinalysis).  · A prostate specific antigen (PSA) screening. This is a blood test used to screen for prostate cancer.  · An ultrasound. This test uses sound waves to electronically produce a picture of your prostate gland.    Your health care provider may refer you to a specialist in kidney and prostate diseases (urologist).  How is this treated?  Once symptoms begin, your health care provider will monitor your condition (active surveillance or watchful waiting). Treatment for this condition will depend on the severity of your condition. Treatment may include:  · Observation and yearly exams. This may be the only treatment needed if your condition and symptoms are mild.  · Medicines to relieve your symptoms, including:  ? Medicines to shrink the prostate.  ? Medicines to relax the muscle of the prostate.  · Surgery in severe cases. Surgery may include:  ? Prostatectomy. In this procedure, the prostate tissue is removed completely through an open incision or with a laparascope or robotics.  ? Transurethral resection of the prostate (TURP). In this procedure, a tool is inserted through the opening at the tip of the penis (urethra). It is used to cut away tissue of   the inner core of the prostate. The pieces are removed through the same opening of the penis. This removes the blockage.  ? Transurethral incision (TUIP). In this procedure, small cuts are made in the prostate. This lessens the prostate's pressure on the urethra.  ? Transurethral microwave thermotherapy (TUMT). This procedure uses microwaves to create heat. The heat destroys and removes a small amount of prostate tissue.  ? Transurethral needle ablation (TUNA). This procedure uses radio frequencies to destroy and remove a small amount of  prostate tissue.  ? Interstitial laser coagulation (ILC). This procedure uses a laser to destroy and remove a small amount of prostate tissue.  ? Transurethral electrovaporization (TUVP). This procedure uses electrodes to destroy and remove a small amount of prostate tissue.  ? Prostatic urethral lift. This procedure inserts an implant to push the lobes of the prostate away from the urethra.    Follow these instructions at home:  · Take over-the-counter and prescription medicines only as told by your health care provider.  · Monitor your symptoms for any changes. Contact your health care provider with any changes.  · Avoid drinking large amounts of liquid before going to bed or out in public.  · Avoid or reduce how much caffeine or alcohol you drink.  · Give yourself time when you urinate.  · Keep all follow-up visits as told by your health care provider. This is important.  Contact a health care provider if:  · You have unexplained back pain.  · Your symptoms do not get better with treatment.  · You develop side effects from the medicine you are taking.  · Your urine becomes very dark or has a bad smell.  · Your lower abdomen becomes distended and you have trouble passing your urine.  Get help right away if:  · You have a fever or chills.  · You suddenly cannot urinate.  · You feel lightheaded, or very dizzy, or you faint.  · There are large amounts of blood or clots in the urine.  · Your urinary problems become hard to manage.  · You develop moderate to severe low back or flank pain. The flank is the side of your body between the ribs and the hip.  These symptoms may represent a serious problem that is an emergency. Do not wait to see if the symptoms will go away. Get medical help right away. Call your local emergency services (911 in the [place] Do not drive yourself to the hospital.  Summary  · Benign prostatic hyperplasia (BPH) is an enlarged prostate that is caused by the normal aging process and not by  cancer.  · An enlarged prostate can press on the urethra. This can make it hard to pass urine.  · This condition is part of a normal aging process and is more likely to develop in men over the age of 50 years.  · Get help right away if you suddenly cannot urinate.  This information is not intended to replace advice given to you by your health care provider. Make sure you discuss any questions you have with your health care provider.  Document Released: 08/15/2005 Document Revised: 09/19/2016 Document Reviewed: 09/19/2016  Elsevier Interactive Patient Education © 2018 [employer]

## 2017-11-07 DIAGNOSIS — H353113 Nonexudative age-related macular degeneration, right eye, advanced atrophic without subfoveal involvement: Secondary | ICD-10-CM | POA: Diagnosis not present

## 2017-11-07 DIAGNOSIS — H353222 Exudative age-related macular degeneration, left eye, with inactive choroidal neovascularization: Secondary | ICD-10-CM | POA: Diagnosis not present

## 2017-11-07 DIAGNOSIS — H43813 Vitreous degeneration, bilateral: Secondary | ICD-10-CM | POA: Diagnosis not present

## 2017-12-21 NOTE — Progress Notes (Addendum)
Subjective:   Nicholas Burton is a 84 y.o. male who presents for Medicare Annual/Subsequent preventive examination.  Review of Systems:  No ROS.  Medicare Wellness Visit. Additional risk factors are reflected in the social history.  Cardiac Risk Factors include: advanced age (>46mn, >>48women);diabetes mellitus;dyslipidemia;hypertension;male gender Sleep patterns: feels rested on waking, gets up 2 times nightly to void and sleeps 8 hours nightly.    Home Safety/Smoke Alarms: Feels safe in home. Smoke alarms in place.  Living environment; residence and Firearm Safety: 1-story house/ trailer, no firearms.Lives with wife, no needs for DME, good support system Seat Belt Safety/Bike Helmet: Wears seat belt.     Objective:    Vitals: BP 118/68   Pulse 83   Resp 18   Ht '5\' 9"'$  (1.753 m)   Wt 210 lb (95.3 kg)   SpO2 99%   BMI 31.01 kg/m   Body mass index is 31.01 kg/m.  Advanced Directives 12/22/2017 12/19/2016 02/16/2016  Does Patient Have a Medical Advance Directive? No No No  Would patient like information on creating a medical advance directive? Yes (ED - Information included in AVS) Yes (MAU/Ambulatory/Procedural Areas - Information given) Yes - Educational materials given    Tobacco Social History   Tobacco Use  Smoking Status Never Smoker  Smokeless Tobacco Never Used     Counseling given: Not Answered  Past Medical History:  Diagnosis Date  . Allergy   . Cancer ([place]    skin  . Cataract   . Diabetes mellitus    type 2  . History of skin cancer   . Hyperlipidemia   . Hypertension   . Osteoarthritis   . Vitamin D deficiency    Past Surgical History:  Procedure Laterality Date  . CHOLECYSTECTOMY    . INGUINAL HERNIA REPAIR     x 3  . JOINT REPLACEMENT    . KNEE SURGERY     x 2  . MELANOMA EXCISION     x 3 -- Left arm  . TOTAL KNEE ARTHROPLASTY     x 3   Family History  Problem Relation Age of Onset  . Heart disease Mother   . Diabetes Mother   .  Heart disease Father   . Cancer Sister   . Cancer Brother   . Diabetes Son   . Diabetes Other    Social History   Socioeconomic History  . Marital status: Married    Spouse name: Not on file  . Number of children: 4  . Years of education: Not on file  . Highest education level: Not on file  Occupational History  . 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 Financial resource strain: Not hard at all  . Food insecurity:    Worry: Never true    Inability: Never true  . Transportation needs:    Medical: No    Non-medical: No  Tobacco Use  . Smoking status: Never Smoker  . Smokeless tobacco: Never Used  Substance and Sexual Activity  . Alcohol use: No  . Drug use: No  . Sexual activity: Yes    Birth control/protection: None  Lifestyle  . Physical activity:    Days per week: 3 days    Minutes per session: 30 min  . Stress: Not at all  Relationships  . Social connections:    Talks on phone: More than three times a week    Gets together: More than three times a week  Attends religious service: More than 4 times per year    Active member of club or organization: Yes    Attends meetings of clubs or organizations: More than 4 times per year    Relationship status: Married  Other Topics Concern  . Not on file  Social History Narrative   No regular exercise    Outpatient Encounter Medications as of 12/22/2017  Medication Sig  . alfuzosin (UROXATRAL) 10 MG 24 hr tablet Take 1 tablet (10 mg total) by mouth daily with breakfast.  . aspirin 81 MG tablet Take 81 mg by mouth daily.    Marland Kitchen atorvastatin (LIPITOR) 10 MG tablet TAKE 1 TABLET DAILY  . B-D ULTRAFINE III [place] PEN 31G X 8 MM MISC USE TO CHECK BLOOD SUGAR UP TO THREE TIMES A DAY  . Blood Glucose Monitoring Suppl (FREESTYLE FREEDOM LITE) W/DEVICE KIT Use to check blood sugars daily Dx E11.8  . cholecalciferol (VITAMIN D) 1000 units tablet Take 1,000 Units by mouth daily.  . cyanocobalamin 1000 MCG  tablet Take 1,000 mcg by mouth daily.  Marland Kitchen FREESTYLE LITE test strip USE TO CHECK BLOOD SUGAR TWICE A DAY  . Insulin Glargine (LANTUS SOLOSTAR) 100 UNIT/ML Solostar Pen Inject 40 Units into the skin daily.  Marland Kitchen KOMBIGLYZE XR 2.12-998 MG TB24 TAKE 2 TABLETS DAILY  . Lancets (FREESTYLE) lancets Use to check blood sugars twice day Dx E11.8  . losartan-hydrochlorothiazide (HYZAAR) 100-12.5 MG tablet TAKE 1 TABLET DAILY  . Multiple Vitamins-Minerals (PRESERVISION/LUTEIN) CAPS Take 1 capsule by mouth 2 (two) times daily.    . WELCHOL 3.75 g PACK MIX THE CONTENTS OF 1 PACKET IN FLUID AND DRINK DAILY   No facility-administered encounter medications on file as of 12/22/2017.     Activities of Daily Living In your present state of health, do you have any difficulty performing the following activities: 12/22/2017  Hearing? N  Vision? N  Difficulty concentrating or making decisions? N  Walking or climbing stairs? N  Dressing or bathing? N  Doing errands, shopping? N  Preparing Food and eating ? N  Using the Toilet? N  In the past six months, have you accidently leaked urine? N  Do you have problems with loss of bowel control? N  Managing your Medications? N  Managing your Finances? N  Housekeeping or managing your Housekeeping? N  Some recent data might be hidden    Patient Care Team: [name], MD as PCP - General   Assessment:   This is a routine wellness examination for [name]. Physical assessment deferred to PCP.   Exercise Activities and Dietary recommendations Current Exercise Habits: The patient does not participate in regular exercise at present  Diet (meal preparation, eat out, water intake, caffeinated beverages, dairy products, fruits and vegetables): in general, a "healthy" diet  , at times poor appetite.  Reviewed heart healthy and diabetic diet, encouraged patient to increase daily water intake. Discussed supplementing with glucerna ( coupons and samples  provided)   Goals    . Patient Stated     Maintain current health status, stay as healthy and as independent as possible. Enjoy life and family.       Fall Risk Fall Risk  12/22/2017 12/19/2016 04/28/2015 04/01/2013 01/07/2013  Falls in the past year? - No No No No  Risk for fall due to : Impaired mobility;Impaired balance/gait Impaired mobility;Impaired balance/gait - - -   Depression Screen PHQ 2/9 Scores 12/22/2017 12/19/2016 12/19/2016 04/28/2015  PHQ - 2 Score 0 0  0 0  PHQ- 9 Score 1 0 - -    Cognitive Function MMSE - Mini Mental State Exam 12/22/2017 12/19/2016  Orientation to time 5 5  Orientation to Place 5 5  Registration 3 3  Attention/ Calculation 5 4  Recall 1 2  Language- name 2 objects 2 2  Language- repeat 1 1  Language- follow 3 step command 3 3  Language- read & follow direction 1 1  Write a sentence 1 1  Copy design 1 1  Total score 28 28        Immunization History  Administered Date(s) Administered  . Influenza Split 05/24/2012  . Influenza Whole 08/30/2007, 05/12/2010  . Influenza, High Dose Seasonal PF 04/28/2015, 05/03/2016  . Influenza,inj,Quad PF,6+ Mos 06/03/2013, 05/20/2014, 06/30/2017  . Pneumococcal Conjugate-13 06/12/2014  . Pneumococcal Polysaccharide-23 08/29/2006, 04/01/2013  . Td 08/29/2002  . Tdap 04/01/2013  . Varicella 07/21/2008  . Zoster 07/16/2008   Screening Tests Health Maintenance  Topic Date Due  . OPHTHALMOLOGY EXAM  06/15/2017  . INFLUENZA VACCINE  03/29/2018  . FOOT EXAM  10/31/2018  . TETANUS/TDAP  04/02/2023  . PNA vac Low Risk Adult  Completed      Plan:    Continue doing brain stimulating activities (puzzles, reading, adult coloring books, staying active) to keep memory sharp.   Continue to eat heart healthy diet (full of fruits, vegetables, whole grains, lean protein, water--limit salt, fat, and sugar intake) and increase physical activity as tolerated.  I have personally reviewed and noted the following in  the patient's chart:   . Medical and social history . Use of alcohol, tobacco or illicit drugs  . Current medications and supplements . Functional ability and status . Nutritional status . Physical activity . Advanced directives . List of other physicians . Vitals . Screenings to include cognitive, depression, and falls . Referrals and appointments  In addition, I have reviewed and discussed with patient certain preventive protocols, quality metrics, and best practice recommendations. A written personalized care plan for preventive services as well as general preventive health recommendations were provided to patient.     Michiel Cowboy, RN  12/22/2017  Medical screening examination/treatment/procedure(s) were performed by non-physician practitioner and as supervising physician I was immediately available for consultation/collaboration. I agree with above. Scarlette Calico, MD

## 2017-12-22 ENCOUNTER — Ambulatory Visit (INDEPENDENT_AMBULATORY_CARE_PROVIDER_SITE_OTHER): Payer: Medicare Other | Admitting: *Deleted

## 2017-12-22 VITALS — BP 118/68 | HR 83 | Resp 18 | Ht 69.0 in | Wt 210.0 lb

## 2017-12-22 DIAGNOSIS — Z0000 Encounter for general adult medical examination without abnormal findings: Secondary | ICD-10-CM

## 2017-12-22 NOTE — Patient Instructions (Addendum)
Continue doing brain stimulating activities (puzzles, reading, adult coloring books, staying active) to keep memory sharp.   Continue to eat heart healthy diet (full of fruits, vegetables, whole grains, lean protein, water--limit salt, fat, and sugar intake) and increase physical activity as tolerated.   [name] , Thank you for taking time to come for your Medicare Wellness Visit. I appreciate your ongoing commitment to your health goals. Please review the following plan we discussed and let me know if I can assist you in the future.   These are the goals we discussed: Goals    . Patient Stated     Maintain current health status, stay as healthy and as independent as possible. Enjoy life and family.       This is a list of the screening recommended for you and due dates:  Health Maintenance  Topic Date Due  . Eye exam for diabetics  06/15/2017  . Flu Shot  03/29/2018  . Complete foot exam   10/31/2018  . Tetanus Vaccine  04/02/2023  . Pneumonia vaccines  Completed     Health Maintenance, Male A healthy lifestyle and preventive care is important for your health and wellness. Ask your health care provider about what schedule of regular examinations is right for you. What should I know about weight and diet? Eat a Healthy Diet  Eat plenty of vegetables, fruits, whole grains, low-fat dairy products, and lean protein.  Do not eat a lot of foods high in solid fats, added sugars, or salt.  Maintain a Healthy Weight Regular exercise can help you achieve or maintain a healthy weight. You should:  Do at least 150 minutes of exercise each week. The exercise should increase your heart rate and make you sweat (moderate-intensity exercise).  Do strength-training exercises at least twice a week.  Watch Your Levels of Cholesterol and Blood Lipids  Have your blood tested for lipids and cholesterol every 5 years starting at 82 years of age. If you are at high risk for heart disease, you  should start having your blood tested when you are 82 years old. You may need to have your cholesterol levels checked more often if: ? Your lipid or cholesterol levels are high. ? You are older than 82 years of age. ? You are at high risk for heart disease.  What should I know about cancer screening? Many types of cancers can be detected early and may often be prevented. Lung Cancer  You should be screened every year for lung cancer if: ? You are a current smoker who has smoked for at least 30 years. ? You are a former smoker who has quit within the past 15 years.  Talk to your health care provider about your screening options, when you should start screening, and how often you should be screened.  Colorectal Cancer  Routine colorectal cancer screening usually begins at 82 years of age and should be repeated every 5-10 years until you are 82 years old. You may need to be screened more often if early forms of precancerous polyps or small growths are found. Your health care provider may recommend screening at an earlier age if you have risk factors for colon cancer.  Your health care provider may recommend using home test kits to check for hidden blood in the stool.  A small camera at the end of a tube can be used to examine your colon (sigmoidoscopy or colonoscopy). This checks for the earliest forms of colorectal cancer.  Prostate  and Testicular Cancer  Depending on your age and overall health, your health care provider may do certain tests to screen for prostate and testicular cancer.  Talk to your health care provider about any symptoms or concerns you have about testicular or prostate cancer.  Skin Cancer  Check your skin from head to toe regularly.  Tell your health care provider about any new moles or changes in moles, especially if: ? There is a change in a mole's size, shape, or color. ? You have a mole that is larger than a pencil eraser.  Always use sunscreen. Apply  sunscreen liberally and repeat throughout the day.  Protect yourself by wearing long sleeves, pants, a wide-brimmed hat, and sunglasses when outside.  What should I know about heart disease, diabetes, and high blood pressure?  If you are 22-54 years of age, have your blood pressure checked every 3-5 years. If you are 54 years of age or older, have your blood pressure checked every year. You should have your blood pressure measured twice-once when you are at a hospital or clinic, and once when you are not at a hospital or clinic. Record the average of the two measurements. To check your blood pressure when you are not at a hospital or clinic, you can use: ? An automated blood pressure machine at a pharmacy. ? A home blood pressure monitor.  Talk to your health care provider about your target blood pressure.  If you are between 36-89 years old, ask your health care provider if you should take aspirin to prevent heart disease.  Have regular diabetes screenings by checking your fasting blood sugar level. ? If you are at a normal weight and have a low risk for diabetes, have this test once every three years after the age of 23. ? If you are overweight and have a high risk for diabetes, consider being tested at a younger age or more often.  A one-time screening for abdominal aortic aneurysm (AAA) by ultrasound is recommended for men aged 46-75 years who are current or former smokers. What should I know about preventing infection? Hepatitis B If you have a higher risk for hepatitis B, you should be screened for this virus. Talk with your health care provider to find out if you are at risk for hepatitis B infection. Hepatitis C Blood testing is recommended for:  Everyone born from [birth year] through 1963.  Anyone with known risk factors for hepatitis C.  Sexually Transmitted Diseases (STDs)  You should be screened each year for STDs including gonorrhea and chlamydia if: ? You are sexually active  and are younger than 82 years of age. ? You are older than 82 years of age and your health care provider tells you that you are at risk for this type of infection. ? Your sexual activity has changed since you were last screened and you are at an increased risk for chlamydia or gonorrhea. Ask your health care provider if you are at risk.  Talk with your health care provider about whether you are at high risk of being infected with HIV. Your health care provider may recommend a prescription medicine to help prevent HIV infection.   What else can I do?  Schedule regular health, dental, and eye exams.  Stay current with your vaccines (immunizations).  Do not use any tobacco products, such as cigarettes, chewing tobacco, and e-cigarettes. If you need help quitting, ask your health care provider.  Limit alcohol intake to no more than 2  drinks per day. One drink equals 12 ounces of beer, 5 ounces of wine, or 1 ounces of hard liquor.  Do not use street drugs.  Do not share needles.  Ask your health care provider for help if you need support or information about quitting drugs.  Tell your health care provider if you often feel depressed.  Tell your health care provider if you have ever been abused or do not feel safe at home. This information is not intended to replace advice given to you by your health care provider. Make sure you discuss any questions you have with your health care provider. Document Released: 02/11/2008 Document Revised: 04/13/2016 Document Reviewed: 05/19/2015 Elsevier Interactive Patient Education  [employer].

## 2017-12-25 ENCOUNTER — Other Ambulatory Visit: Payer: Self-pay | Admitting: Internal Medicine

## 2017-12-25 DIAGNOSIS — E785 Hyperlipidemia, unspecified: Secondary | ICD-10-CM

## 2017-12-25 DIAGNOSIS — E118 Type 2 diabetes mellitus with unspecified complications: Secondary | ICD-10-CM

## 2018-01-09 ENCOUNTER — Other Ambulatory Visit: Payer: Self-pay | Admitting: Internal Medicine

## 2018-01-09 DIAGNOSIS — J449 Chronic obstructive pulmonary disease, unspecified: Secondary | ICD-10-CM

## 2018-01-11 DIAGNOSIS — H52223 Regular astigmatism, bilateral: Secondary | ICD-10-CM | POA: Diagnosis not present

## 2018-01-11 DIAGNOSIS — H5203 Hypermetropia, bilateral: Secondary | ICD-10-CM | POA: Diagnosis not present

## 2018-01-11 DIAGNOSIS — Z961 Presence of intraocular lens: Secondary | ICD-10-CM | POA: Diagnosis not present

## 2018-01-11 DIAGNOSIS — E109 Type 1 diabetes mellitus without complications: Secondary | ICD-10-CM | POA: Diagnosis not present

## 2018-01-11 DIAGNOSIS — H524 Presbyopia: Secondary | ICD-10-CM | POA: Diagnosis not present

## 2018-01-11 DIAGNOSIS — H3530 Unspecified macular degeneration: Secondary | ICD-10-CM | POA: Diagnosis not present

## 2018-01-11 LAB — HM DIABETES EYE EXAM

## 2018-01-15 DIAGNOSIS — L821 Other seborrheic keratosis: Secondary | ICD-10-CM | POA: Diagnosis not present

## 2018-01-15 DIAGNOSIS — L819 Disorder of pigmentation, unspecified: Secondary | ICD-10-CM | POA: Diagnosis not present

## 2018-01-15 DIAGNOSIS — L570 Actinic keratosis: Secondary | ICD-10-CM | POA: Diagnosis not present

## 2018-01-15 DIAGNOSIS — D1801 Hemangioma of skin and subcutaneous tissue: Secondary | ICD-10-CM | POA: Diagnosis not present

## 2018-01-15 DIAGNOSIS — Z85820 Personal history of malignant melanoma of skin: Secondary | ICD-10-CM | POA: Diagnosis not present

## 2018-01-15 DIAGNOSIS — D229 Melanocytic nevi, unspecified: Secondary | ICD-10-CM | POA: Diagnosis not present

## 2018-01-17 ENCOUNTER — Other Ambulatory Visit: Payer: Self-pay | Admitting: Internal Medicine

## 2018-01-29 ENCOUNTER — Ambulatory Visit (INDEPENDENT_AMBULATORY_CARE_PROVIDER_SITE_OTHER): Payer: Medicare Other | Admitting: Internal Medicine

## 2018-01-29 ENCOUNTER — Encounter: Payer: Self-pay | Admitting: Internal Medicine

## 2018-01-29 VITALS — BP 112/60 | HR 77 | Temp 98.7°F | Resp 16 | Ht 69.0 in | Wt 210.0 lb

## 2018-01-29 DIAGNOSIS — R600 Localized edema: Secondary | ICD-10-CM

## 2018-01-29 DIAGNOSIS — I872 Venous insufficiency (chronic) (peripheral): Secondary | ICD-10-CM | POA: Diagnosis not present

## 2018-01-29 HISTORY — DX: Venous insufficiency (chronic) (peripheral): I87.2

## 2018-01-29 MED ORDER — FLUOCINONIDE-E 0.05 % EX CREA: 1.0000 "application " | TOPICAL_CREAM | Freq: Two times a day (BID) | CUTANEOUS | 3 refills | Status: DC

## 2018-01-29 MED ORDER — TORSEMIDE 20 MG PO TABS: 20.0000 mg | ORAL_TABLET | Freq: Every day | ORAL | 0 refills | Status: DC

## 2018-01-29 MED ORDER — VASCULERA PO TABS: 1.0000 | ORAL_TABLET | Freq: Every day | ORAL | 11 refills | Status: DC

## 2018-01-29 NOTE — Progress Notes (Signed)
Subjective:  Patient ID: Nicholas Burton, male    DOB: 1927-11-25  Age: 84 y.o. MRN: [id number]  CC: Leg Swelling and Rash   HPI [name] presents for f/up - He complains of a one-month history of swelling, itching, redness, and peeling over both lower extremities, worse on the right than the left.  Outpatient Medications Prior to Visit  Medication Sig Dispense Refill  . atorvastatin (LIPITOR) 10 MG tablet TAKE 1 TABLET DAILY 90 tablet 1  . B-D ULTRAFINE III Mcneely PEN 31G X 8 MM MISC USE TO CHECK BLOOD SUGAR UP TO THREE TIMES A DAY 300 each 3  . Blood Glucose Monitoring Suppl (FREESTYLE FREEDOM LITE) W/DEVICE KIT Use to check blood sugars daily Dx E11.8 1 each 0  . cholecalciferol (VITAMIN D) 1000 units tablet Take 1,000 Units by mouth daily.    . cyanocobalamin 1000 MCG tablet Take 1,000 mcg by mouth daily.    Marland Kitchen FREESTYLE LITE test strip USE TO CHECK BLOOD SUGAR TWICE A DAY 200 each 2  . Insulin Glargine (LANTUS SOLOSTAR) 100 UNIT/ML Solostar Pen Inject 40 Units into the skin daily. 45 mL 3  . KOMBIGLYZE XR 2.12-998 MG TB24 TAKE 2 TABLETS DAILY 180 tablet 1  . Lancets (FREESTYLE) lancets Use to check blood sugars twice day Dx E11.8 180 each 3  . Multiple Vitamins-Minerals (PRESERVISION/LUTEIN) CAPS Take 1 capsule by mouth 2 (two) times daily.      . WELCHOL 3.75 g PACK MIX THE CONTENTS OF 1 PACKET IN FLUID AND DRINK DAILY 90 each 2  . losartan-hydrochlorothiazide (HYZAAR) 100-12.5 MG tablet TAKE 1 TABLET DAILY 90 tablet 1  . aspirin 81 MG tablet Take 81 mg by mouth daily.      Marland Kitchen alfuzosin (UROXATRAL) 10 MG 24 hr tablet Take 1 tablet (10 mg total) by mouth daily with breakfast. (Patient not taking: Reported on 01/29/2018) 90 tablet 1   No facility-administered medications prior to visit.     ROS Review of Systems  Constitutional: Negative.  Negative for chills, diaphoresis, fatigue and fever.  HENT: Negative.   Eyes: Negative for visual disturbance.  Respiratory: Negative for  cough, chest tightness, shortness of breath and wheezing.   Cardiovascular: Positive for leg swelling. Negative for chest pain and palpitations.  Gastrointestinal: Negative for abdominal pain, diarrhea, nausea and vomiting.  Endocrine: Negative.   Genitourinary: Negative.  Negative for difficulty urinating.  Musculoskeletal: Negative for arthralgias, back pain, joint swelling, myalgias and neck stiffness.  Skin: Positive for rash.  Neurological: Negative.  Negative for dizziness, weakness and light-headedness.  Hematological: Negative for adenopathy. Does not bruise/bleed easily.  Psychiatric/Behavioral: Negative.     Objective:  BP 112/60 (BP Location: Left Arm, Patient Position: Sitting, Cuff Size: Normal)   Pulse 77   Temp 98.7 F (37.1 C) (Oral)   Resp 16   Ht 5' 9"  (1.753 m)   Wt 210 lb (95.3 kg)   SpO2 93%   BMI 31.01 kg/m   BP Readings from Last 3 Encounters:  01/29/18 112/60  12/22/17 118/68  10/30/17 110/70    Wt Readings from Last 3 Encounters:  01/29/18 210 lb (95.3 kg)  12/22/17 210 lb (95.3 kg)  10/30/17 205 lb 12 oz (93.3 kg)    Physical Exam  Constitutional: He is oriented to person, place, and time. No distress.  HENT:  Mouth/Throat: Oropharynx is clear and moist. No oropharyngeal exudate.  Eyes: Conjunctivae are normal. No scleral icterus.  Neck: Normal range of motion. Neck  supple. No JVD present. No thyromegaly present.  Cardiovascular: Normal rate, regular rhythm and normal heart sounds. Exam reveals no gallop.  No murmur heard. Pulmonary/Chest: Effort normal and breath sounds normal. No respiratory distress. He has no wheezes. He has no rales.  Abdominal: Soft. Bowel sounds are normal. He exhibits no mass. There is no hepatosplenomegaly. There is no tenderness.  Musculoskeletal: Normal range of motion. He exhibits edema. He exhibits no tenderness or deformity.  Over both lower extremities, worse on the right than the left, there is faint/lacy  erythema with no warmth/induration/streaking/induration/fluctuance.  There is also superficial peeling.  There is 2+ pitting edema in the right lower extremity and 1+ pitting edema in the left lower extremity.  Lymphadenopathy:    He has no cervical adenopathy.  Neurological: He is alert and oriented to person, place, and time.  Skin: Skin is warm and dry. Rash noted. He is not diaphoretic.  In addition to the rash there are a few areas of excoriation over the lower extremities.  Vitals reviewed.   Lab Results  Component Value Date   WBC 9.0 10/30/2017   HGB 12.9 (L) 10/30/2017   HCT 37.3 (L) 10/30/2017   PLT 253.0 10/30/2017   GLUCOSE 87 10/30/2017   CHOL 67 10/30/2017   TRIG 63.0 10/30/2017   HDL 29.40 (L) 10/30/2017   LDLCALC 25 10/30/2017   ALT 22 10/30/2017   AST 27 10/30/2017   NA 141 10/30/2017   K 3.8 10/30/2017   CL 107 10/30/2017   CREATININE 1.44 10/30/2017   BUN 27 (H) 10/30/2017   CO2 26 10/30/2017   TSH 3.93 10/30/2017   PSA 11.21 (H) 10/30/2017   INR 1.3 ratio (H) 08/11/2010   HGBA1C 6.9 (H) 10/30/2017   MICROALBUR <0.7 10/30/2017    Dg Chest 2 View  Result Date: 02/05/2016 CLINICAL DATA:  Melanoma. EXAM: CHEST  2 VIEW COMPARISON:  01/29/2015. FINDINGS: The cardiac silhouette, mediastinal and hilar contours are within normal limits for age and stable. There is mild tortuosity of the thoracic aorta. Stable basilar scarring changes. No infiltrates, edema or effusions. No worrisome pulmonary lesions. The bony thorax is intact. IMPRESSION: No acute cardiopulmonary findings no worrisome pulmonary lesions. Stable basilar scarring changes. Electronically Signed   By: Marijo Sanes M.D.   On: 02/05/2016 14:08    Assessment & Plan:   Donnovan was seen today for leg swelling and rash.  Diagnoses and all orders for this visit:  Localized edema- I have asked him to stop taking the thiazide diuretic and will change to a loop diuretic to help reduce the lower extremity  edema.  He will also elevate his lower extremities as much as possible and will decrease his intake of sodium. -     torsemide (DEMADEX) 20 MG tablet; Take 1 tablet (20 mg total) by mouth daily.  Venous stasis dermatitis of both lower extremities- I have asked him to apply a topical, potent steroid to reduce the itching and peeling.  Will also start vasculera to reduce complications such as ulcers and wounds. -     torsemide (DEMADEX) 20 MG tablet; Take 1 tablet (20 mg total) by mouth daily. -     Dietary Management Product (VASCULERA) TABS; Take 1 capsule by mouth daily. -     fluocinonide-emollient (LIDEX-E) 0.05 % cream; Apply 1 application topically 2 (two) times daily.   I have discontinued [name]. Omary's losartan-hydrochlorothiazide and alfuzosin. I am also having him start on torsemide, VASCULERA, and fluocinonide-emollient. Additionally,  I am having him maintain his aspirin, PRESERVISION/LUTEIN, FREESTYLE FREEDOM LITE, freestyle, cyanocobalamin, cholecalciferol, B-D ULTRAFINE III Rybarczyk PEN, WELCHOL, Insulin Glargine, FREESTYLE LITE, atorvastatin, and KOMBIGLYZE XR.  Meds ordered this encounter  Medications  . torsemide (DEMADEX) 20 MG tablet    Sig: Take 1 tablet (20 mg total) by mouth daily.    Dispense:  90 tablet    Refill:  0  . Dietary Management Product (VASCULERA) TABS    Sig: Take 1 capsule by mouth daily.    Dispense:  30 tablet    Refill:  11  . fluocinonide-emollient (LIDEX-E) 0.05 % cream    Sig: Apply 1 application topically 2 (two) times daily.    Dispense:  60 g    Refill:  3     Follow-up: Return in about 3 months (around 05/01/2018).  Scarlette Calico, MD

## 2018-01-29 NOTE — Patient Instructions (Signed)
Edema Edema is an abnormal buildup of fluids in your bodytissues. Edema is somewhatdependent on gravity to pull the fluid to the lowest place in your body. That makes the condition more common in the legs and thighs (lower extremities). Painless swelling of the feet and ankles is common and becomes more likely as you get older. It is also common in looser tissues, like around your eyes. When the affected area is squeezed, the fluid may move out of that spot and leave a dent for a few moments. This dent is called pitting. What are the causes? There are many possible causes of edema. Eating too much salt and being on your feet or sitting for a long time can cause edema in your legs and ankles. Hot weather may make edema worse. Common medical causes of edema include:  Heart failure.  Liver disease.  Kidney disease.  Weak blood vessels in your legs.  Cancer.  An injury.  Pregnancy.  Some medications.  Obesity.  What are the signs or symptoms? Edema is usually painless.Your skin may look swollen or shiny. How is this diagnosed? Your health care provider may be able to diagnose edema by asking about your medical history and doing a physical exam. You may need to have tests such as X-rays, an electrocardiogram, or blood tests to check for medical conditions that may cause edema. How is this treated? Edema treatment depends on the cause. If you have heart, liver, or kidney disease, you need the treatment appropriate for these conditions. General treatment may include:  Elevation of the affected body part above the level of your heart.  Compression of the affected body part. Pressure from elastic bandages or support stockings squeezes the tissues and forces fluid back into the blood vessels. This keeps fluid from entering the tissues.  Restriction of fluid and salt intake.  Use of a water pill (diuretic). These medications are appropriate only for some types of edema. They pull fluid  out of your body and make you urinate more often. This gets rid of fluid and reduces swelling, but diuretics can have side effects. Only use diuretics as directed by your health care provider.  Follow these instructions at home:  Keep the affected body part above the level of your heart when you are lying down.  Do not sit still or stand for prolonged periods.  Do not put anything directly under your knees when lying down.  Do not wear constricting clothing or garters on your upper legs.  Exercise your legs to work the fluid back into your blood vessels. This may help the swelling go down.  Wear elastic bandages or support stockings to reduce ankle swelling as directed by your health care provider.  Eat a low-salt diet to reduce fluid if your health care provider recommends it.  Only take medicines as directed by your health care provider. Contact a health care provider if:  Your edema is not responding to treatment.  You have heart, liver, or kidney disease and notice symptoms of edema.  You have edema in your legs that does not improve after elevating them.  You have sudden and unexplained weight gain. Get help right away if:  You develop shortness of breath or chest pain.  You cannot breathe when you lie down.  You develop pain, redness, or warmth in the swollen areas.  You have heart, liver, or kidney disease and suddenly get edema.  You have a fever and your symptoms suddenly get worse. This information is   not intended to replace advice given to you by your health care provider. Make sure you discuss any questions you have with your health care provider. Document Released: 08/15/2005 Document Revised: 01/21/2016 Document Reviewed: 06/07/2013 Elsevier Interactive Patient Education  2017 [employer]  

## 2018-02-07 ENCOUNTER — Encounter: Payer: Self-pay | Admitting: Internal Medicine

## 2018-02-21 ENCOUNTER — Other Ambulatory Visit: Payer: Self-pay | Admitting: Internal Medicine

## 2018-03-05 ENCOUNTER — Telehealth: Payer: Self-pay | Admitting: Internal Medicine

## 2018-03-05 DIAGNOSIS — I872 Venous insufficiency (chronic) (peripheral): Secondary | ICD-10-CM

## 2018-03-05 NOTE — Telephone Encounter (Signed)
Copied from [place] [phone]. Topic: Quick Communication - Rx Refill/Question >> Mar 05, 2018 11:23 AM Margot Ables wrote: Medication: Dietary Management Product (VASCULERA) TABS; Take 1 capsule by mouth daily. - pt called to follow up as he completed samples. Should he continue taking this medication. Has the patient contacted their pharmacy? Yes - they don't have an RX Preferred Pharmacy (with phone number or street name): [id number] [address], [place] [phone] (Phone) [phone] (Fax)

## 2018-03-06 MED ORDER — VASCULERA PO TABS
1.0000 | ORAL_TABLET | Freq: Every day | ORAL | 11 refills | Status: DC
Start: 2018-03-06 — End: 2019-02-11

## 2018-03-06 NOTE — Telephone Encounter (Signed)
erx has been sent.  

## 2018-05-01 ENCOUNTER — Ambulatory Visit (INDEPENDENT_AMBULATORY_CARE_PROVIDER_SITE_OTHER): Payer: Medicare Other | Admitting: Internal Medicine

## 2018-05-01 ENCOUNTER — Other Ambulatory Visit (INDEPENDENT_AMBULATORY_CARE_PROVIDER_SITE_OTHER): Payer: Medicare Other

## 2018-05-01 ENCOUNTER — Encounter: Payer: Self-pay | Admitting: Internal Medicine

## 2018-05-01 VITALS — BP 120/78 | HR 78 | Temp 97.7°F | Resp 16 | Ht 69.0 in | Wt 203.8 lb

## 2018-05-01 DIAGNOSIS — E118 Type 2 diabetes mellitus with unspecified complications: Secondary | ICD-10-CM

## 2018-05-01 DIAGNOSIS — I872 Venous insufficiency (chronic) (peripheral): Secondary | ICD-10-CM

## 2018-05-01 DIAGNOSIS — Z23 Encounter for immunization: Secondary | ICD-10-CM | POA: Diagnosis not present

## 2018-05-01 DIAGNOSIS — E538 Deficiency of other specified B group vitamins: Secondary | ICD-10-CM

## 2018-05-01 DIAGNOSIS — I10 Essential (primary) hypertension: Secondary | ICD-10-CM

## 2018-05-01 DIAGNOSIS — J449 Chronic obstructive pulmonary disease, unspecified: Secondary | ICD-10-CM

## 2018-05-01 LAB — CBC WITH DIFFERENTIAL/PLATELET
Basophils Absolute: 0.1 10*3/uL (ref 0.0–0.1)
Basophils Relative: 0.6 % (ref 0.0–3.0)
EOS PCT: 2.5 % (ref 0.0–5.0)
Eosinophils Absolute: 0.3 10*3/uL (ref 0.0–0.7)
HCT: 37.8 % — ABNORMAL LOW (ref 39.0–52.0)
Hemoglobin: 12.9 g/dL — ABNORMAL LOW (ref 13.0–17.0)
Lymphocytes Relative: 31.7 % (ref 12.0–46.0)
Lymphs Abs: 3.4 10*3/uL (ref 0.7–4.0)
MCHC: 34.2 g/dL (ref 30.0–36.0)
MCV: 83.9 fl (ref 78.0–100.0)
Monocytes Absolute: 1.1 10*3/uL — ABNORMAL HIGH (ref 0.1–1.0)
Monocytes Relative: 10.2 % (ref 3.0–12.0)
Neutro Abs: 5.8 10*3/uL (ref 1.4–7.7)
Neutrophils Relative %: 55 % (ref 43.0–77.0)
Platelets: 241 10*3/uL (ref 150.0–400.0)
RBC: 4.51 Mil/uL (ref 4.22–5.81)
RDW: 14.6 % (ref 11.5–15.5)
WBC: 10.6 10*3/uL — ABNORMAL HIGH (ref 4.0–10.5)

## 2018-05-01 LAB — BASIC METABOLIC PANEL
BUN: 25 mg/dL — ABNORMAL HIGH (ref 6–23)
CALCIUM: 9.1 mg/dL (ref 8.4–10.5)
CO2: 29 mEq/L (ref 19–32)
Chloride: 104 mEq/L (ref 96–112)
Creatinine, Ser: 1.52 mg/dL — ABNORMAL HIGH (ref 0.40–1.50)
GFR: 46.19 mL/min — AB (ref >60.00)
GLUCOSE: 85 mg/dL (ref 70–99)
POTASSIUM: 3.8 meq/L (ref 3.5–5.1)
Sodium: 142 mEq/L (ref 135–145)

## 2018-05-01 LAB — POCT GLYCOSYLATED HEMOGLOBIN (HGB A1C): Hemoglobin A1C: 6.7 % — AB (ref 4.0–5.6)

## 2018-05-01 MED ORDER — TORSEMIDE 10 MG PO TABS: 10.0000 mg | ORAL_TABLET | Freq: Every day | ORAL | 1 refills | Status: DC

## 2018-05-01 MED ORDER — ZOSTER VAC RECOMB ADJUVANTED 50 MCG/0.5ML IM SUSR: 0.5000 mL | Freq: Once | INTRAMUSCULAR | 1 refills | Status: AC

## 2018-05-01 NOTE — Progress Notes (Signed)
Subjective:  Patient ID: Nicholas Burton, male    DOB: Feb 23, 1928  Age: 84 y.o. MRN: [id number]  CC: Hypertension; Diabetes; and Anemia   HPI Nicholas Burton presents for f/up - He complains of persistent lower extremity edema but he also complains over the last few weeks of a few episodes of dizziness and lightheadedness.  He wants to know if he can take a lower dose of the diuretic.  Outpatient Medications Prior to Visit  Medication Sig Dispense Refill  . atorvastatin (LIPITOR) 10 MG tablet TAKE 1 TABLET DAILY 90 tablet 1  . B-D ULTRAFINE III [place]rein PEN 31G X 8 MM MISC USE TO CHECK BLOOD SUGAR UP TO THREE TIMES A DAY 300 each 3  . Blood Glucose Monitoring Suppl (FREESTYLE FREEDOM LITE) W/DEVICE KIT Use to check blood sugars daily Dx E11.8 1 each 0  . cholecalciferol (VITAMIN D) 1000 units tablet Take 1,000 Units by mouth daily.    . cyanocobalamin 1000 MCG tablet Take 1,000 mcg by mouth daily.    . Dietary Management Product (VASCULERA) TABS Take 1 capsule by mouth daily. 30 tablet 11  . FREESTYLE LITE test strip USE TO CHECK BLOOD SUGAR TWICE A DAY 200 each 2  . Insulin Glargine (LANTUS SOLOSTAR) 100 UNIT/ML Solostar Pen Inject 40 Units into the skin daily. 45 mL 3  . KOMBIGLYZE XR 2.12-998 MG TB24 TAKE 2 TABLETS DAILY 180 tablet 1  . Lancets (FREESTYLE) lancets Use to check blood sugars twice day Dx E11.8 180 each 3  . Multiple Vitamins-Minerals (PRESERVISION/LUTEIN) CAPS Take 1 capsule by mouth 2 (two) times daily.      . WELCHOL 3.75 g PACK MIX THE CONTENTS OF 1 PACKET IN FLUID AND DRINK DAILY 90 each 2  . aspirin 81 MG tablet Take 81 mg by mouth daily.      . fluocinonide-emollient (LIDEX-E) 0.05 % cream Apply 1 application topically 2 (two) times daily. 60 g 3  . torsemide (DEMADEX) 20 MG tablet Take 1 tablet (20 mg total) by mouth daily. 90 tablet 0   No facility-administered medications prior to visit.     ROS Review of Systems  Constitutional: Negative for diaphoresis and  fatigue.  HENT: Negative.   Eyes: Negative for visual disturbance.  Respiratory: Negative for cough, chest tightness, shortness of breath and wheezing.   Cardiovascular: Positive for leg swelling. Negative for chest pain and palpitations.  Gastrointestinal: Negative for abdominal pain, constipation, diarrhea, nausea and vomiting.  Endocrine: Negative.  Negative for polydipsia, polyphagia and polyuria.  Genitourinary: Negative.  Negative for difficulty urinating.  Musculoskeletal: Negative.  Negative for arthralgias and myalgias.  Skin: Negative.  Negative for color change and pallor.  Neurological: Positive for dizziness and light-headedness. Negative for weakness.  Hematological: Negative for adenopathy. Does not bruise/bleed easily.  Psychiatric/Behavioral: Negative.     Objective:  BP 120/78 (BP Location: Left Arm, Patient Position: Sitting, Cuff Size: Normal)   Pulse 78   Temp 97.7 F (36.5 C) (Oral)   Resp 16   Ht '5\' 9"'$  (1.753 m)   Wt 203 lb 12 oz (92.4 kg)   SpO2 97%   BMI 30.09 kg/m   BP Readings from Last 3 Encounters:  05/01/18 120/78  01/29/18 112/60  12/22/17 118/68    Wt Readings from Last 3 Encounters:  05/01/18 203 lb 12 oz (92.4 kg)  01/29/18 210 lb (95.3 kg)  12/22/17 210 lb (95.3 kg)    Physical Exam  Constitutional: He is oriented to person,  place, and time. No distress.  HENT:  Mouth/Throat: Oropharynx is clear and moist. No oropharyngeal exudate.  Eyes: Conjunctivae are normal. No scleral icterus.  Neck: Normal range of motion. Neck supple. No JVD present. No thyromegaly present.  Cardiovascular: Normal rate, regular rhythm and normal heart sounds. Exam reveals no gallop.  No murmur heard. Pulmonary/Chest: Effort normal and breath sounds normal. No respiratory distress. He has no wheezes. He has no rales.  Abdominal: Soft. Bowel sounds are normal. He exhibits no mass. There is no hepatosplenomegaly. There is no tenderness.  Musculoskeletal: He  exhibits edema. He exhibits no tenderness or deformity.  1+ pitting edema around both ankles  Lymphadenopathy:    He has no cervical adenopathy.  Neurological: He is alert and oriented to person, place, and time.  Skin: Skin is warm and dry. He is not diaphoretic.  Vitals reviewed.   Lab Results  Component Value Date   WBC 10.6 (H) 05/01/2018   HGB 12.9 (L) 05/01/2018   HCT 37.8 (L) 05/01/2018   PLT 241.0 05/01/2018   GLUCOSE 85 05/01/2018   CHOL 67 10/30/2017   TRIG 63.0 10/30/2017   HDL 29.40 (L) 10/30/2017   LDLCALC 25 10/30/2017   ALT 22 10/30/2017   AST 27 10/30/2017   NA 142 05/01/2018   K 3.8 05/01/2018   CL 104 05/01/2018   CREATININE 1.52 (H) 05/01/2018   BUN 25 (H) 05/01/2018   CO2 29 05/01/2018   TSH 3.93 10/30/2017   PSA 11.21 (H) 10/30/2017   INR 1.3 ratio (H) 08/11/2010   HGBA1C 6.7 (A) 05/01/2018   MICROALBUR <0.7 10/30/2017    Dg Chest 2 View  Result Date: 02/05/2016 CLINICAL DATA:  Melanoma. EXAM: CHEST  2 VIEW COMPARISON:  01/29/2015. FINDINGS: The cardiac silhouette, mediastinal and hilar contours are within normal limits for age and stable. There is mild tortuosity of the thoracic aorta. Stable basilar scarring changes. No infiltrates, edema or effusions. No worrisome pulmonary lesions. The bony thorax is intact. IMPRESSION: No acute cardiopulmonary findings no worrisome pulmonary lesions. Stable basilar scarring changes. Electronically Signed   By: Marijo Sanes M.D.   On: 02/05/2016 14:08    Assessment & Plan:   Nicholas Burton was seen today for hypertension, diabetes and anemia.  Diagnoses and all orders for this visit:  Type 2 diabetes mellitus with complication, without long-term current use of insulin (HCC)-his A1c is at 6.7%.  His blood sugars are adequately well controlled.  We will continue the combination of metformin and a DPP 4 inhibitor. -     Basic metabolic panel; Future -     HM Diabetes Foot Exam -     POCT glycosylated hemoglobin (Hb  A1C)  COPD with asthma (HCC)  B12 deficiency- His H&H have improved.  Will continue B12 replacement therapy. -     CBC with Differential/Platelet; Future  Need for influenza vaccination -     Flu vaccine HIGH DOSE PF (Fluzone High dose)  Need for shingles vaccine -     Zoster Vaccine Adjuvanted [facility]) injection; Inject 0.5 mLs into the muscle once for 1 dose.  Essential hypertension- He is symptomatic on the current dose of torsemide at 20 mg a day.  Will decrease his dose to 10 mg a day.  His electrolytes are normal and his renal function is stable. -     torsemide (DEMADEX) 10 MG tablet; Take 1 tablet (10 mg total) by mouth daily. -     Basic metabolic panel; Future  Venous  stasis dermatitis of both lower extremities   I have discontinued Nicholas Burton. Nicholas Burton's aspirin, torsemide, and fluocinonide-emollient. I am also having him start on Zoster Vaccine Adjuvanted and torsemide. Additionally, I am having him maintain his PRESERVISION/LUTEIN, FREESTYLE FREEDOM LITE, freestyle, cyanocobalamin, cholecalciferol, B-D ULTRAFINE III Jelinski PEN, Insulin Glargine, FREESTYLE LITE, atorvastatin, KOMBIGLYZE XR, WELCHOL, and VASCULERA.  Meds ordered this encounter  Medications  . Zoster Vaccine Adjuvanted [facility]) injection    Sig: Inject 0.5 mLs into the muscle once for 1 dose.    Dispense:  0.5 mL    Refill:  1  . torsemide (DEMADEX) 10 MG tablet    Sig: Take 1 tablet (10 mg total) by mouth daily.    Dispense:  90 tablet    Refill:  1     Follow-up: Return in about 6 months (around 10/30/2018).  Scarlette Calico, MD

## 2018-05-01 NOTE — Patient Instructions (Signed)

## 2018-05-08 DIAGNOSIS — H353223 Exudative age-related macular degeneration, left eye, with inactive scar: Secondary | ICD-10-CM | POA: Diagnosis not present

## 2018-05-08 DIAGNOSIS — H3540 Unspecified peripheral retinal degeneration: Secondary | ICD-10-CM | POA: Diagnosis not present

## 2018-05-08 DIAGNOSIS — H3581 Retinal edema: Secondary | ICD-10-CM | POA: Diagnosis not present

## 2018-05-08 DIAGNOSIS — H353113 Nonexudative age-related macular degeneration, right eye, advanced atrophic without subfoveal involvement: Secondary | ICD-10-CM | POA: Diagnosis not present

## 2018-06-23 ENCOUNTER — Other Ambulatory Visit: Payer: Self-pay | Admitting: Internal Medicine

## 2018-06-23 DIAGNOSIS — E785 Hyperlipidemia, unspecified: Secondary | ICD-10-CM

## 2018-06-23 DIAGNOSIS — E118 Type 2 diabetes mellitus with unspecified complications: Secondary | ICD-10-CM

## 2018-07-16 ENCOUNTER — Other Ambulatory Visit: Payer: Self-pay | Admitting: Internal Medicine

## 2018-07-18 DIAGNOSIS — L570 Actinic keratosis: Secondary | ICD-10-CM | POA: Diagnosis not present

## 2018-07-18 DIAGNOSIS — B351 Tinea unguium: Secondary | ICD-10-CM | POA: Diagnosis not present

## 2018-07-18 DIAGNOSIS — L819 Disorder of pigmentation, unspecified: Secondary | ICD-10-CM | POA: Diagnosis not present

## 2018-07-18 DIAGNOSIS — L821 Other seborrheic keratosis: Secondary | ICD-10-CM | POA: Diagnosis not present

## 2018-07-18 DIAGNOSIS — Z85820 Personal history of malignant melanoma of skin: Secondary | ICD-10-CM | POA: Diagnosis not present

## 2018-07-18 DIAGNOSIS — D225 Melanocytic nevi of trunk: Secondary | ICD-10-CM | POA: Diagnosis not present

## 2018-07-18 DIAGNOSIS — D1801 Hemangioma of skin and subcutaneous tissue: Secondary | ICD-10-CM | POA: Diagnosis not present

## 2018-07-24 DIAGNOSIS — H353222 Exudative age-related macular degeneration, left eye, with inactive choroidal neovascularization: Secondary | ICD-10-CM | POA: Diagnosis not present

## 2018-07-24 DIAGNOSIS — H353211 Exudative age-related macular degeneration, right eye, with active choroidal neovascularization: Secondary | ICD-10-CM | POA: Diagnosis not present

## 2018-07-24 DIAGNOSIS — H3540 Unspecified peripheral retinal degeneration: Secondary | ICD-10-CM | POA: Diagnosis not present

## 2018-07-24 DIAGNOSIS — H35371 Puckering of macula, right eye: Secondary | ICD-10-CM | POA: Diagnosis not present

## 2018-08-30 ENCOUNTER — Other Ambulatory Visit: Payer: Self-pay | Admitting: Internal Medicine

## 2018-08-31 DIAGNOSIS — H353211 Exudative age-related macular degeneration, right eye, with active choroidal neovascularization: Secondary | ICD-10-CM | POA: Diagnosis not present

## 2018-09-03 ENCOUNTER — Encounter: Payer: Self-pay | Admitting: Internal Medicine

## 2018-09-03 ENCOUNTER — Ambulatory Visit (INDEPENDENT_AMBULATORY_CARE_PROVIDER_SITE_OTHER): Payer: Medicare Other | Admitting: Internal Medicine

## 2018-09-03 ENCOUNTER — Other Ambulatory Visit (INDEPENDENT_AMBULATORY_CARE_PROVIDER_SITE_OTHER): Payer: Medicare Other

## 2018-09-03 VITALS — BP 140/80 | HR 78 | Temp 97.7°F | Ht 69.0 in | Wt 201.5 lb

## 2018-09-03 DIAGNOSIS — R600 Localized edema: Secondary | ICD-10-CM

## 2018-09-03 DIAGNOSIS — E118 Type 2 diabetes mellitus with unspecified complications: Secondary | ICD-10-CM | POA: Diagnosis not present

## 2018-09-03 DIAGNOSIS — R0609 Other forms of dyspnea: Secondary | ICD-10-CM | POA: Insufficient documentation

## 2018-09-03 DIAGNOSIS — E538 Deficiency of other specified B group vitamins: Secondary | ICD-10-CM | POA: Diagnosis not present

## 2018-09-03 DIAGNOSIS — I10 Essential (primary) hypertension: Secondary | ICD-10-CM | POA: Diagnosis not present

## 2018-09-03 DIAGNOSIS — R0600 Dyspnea, unspecified: Secondary | ICD-10-CM

## 2018-09-03 HISTORY — DX: Dyspnea, unspecified: R06.00

## 2018-09-03 HISTORY — DX: Other forms of dyspnea: R06.09

## 2018-09-03 LAB — CBC WITH DIFFERENTIAL/PLATELET
Basophils Absolute: 0 10*3/uL (ref 0.0–0.1)
Basophils Relative: 0.5 % (ref 0.0–3.0)
EOS PCT: 1.7 % (ref 0.0–5.0)
Eosinophils Absolute: 0.2 10*3/uL (ref 0.0–0.7)
HCT: 38.2 % — ABNORMAL LOW (ref 39.0–52.0)
Hemoglobin: 12.7 g/dL — ABNORMAL LOW (ref 13.0–17.0)
Lymphocytes Relative: 29.1 % (ref 12.0–46.0)
Lymphs Abs: 3.1 10*3/uL (ref 0.7–4.0)
MCHC: 33.2 g/dL (ref 30.0–36.0)
MCV: 84.3 fl (ref 78.0–100.0)
MONO ABS: 1 10*3/uL (ref 0.1–1.0)
Monocytes Relative: 9.7 % (ref 3.0–12.0)
Neutro Abs: 6.4 10*3/uL (ref 1.4–7.7)
Neutrophils Relative %: 59 % (ref 43.0–77.0)
Platelets: 295 10*3/uL (ref 150.0–400.0)
RBC: 4.53 Mil/uL (ref 4.22–5.81)
RDW: 14.3 % (ref 11.5–15.5)
WBC: 10.8 10*3/uL — ABNORMAL HIGH (ref 4.0–10.5)

## 2018-09-03 LAB — COMPREHENSIVE METABOLIC PANEL
ALT: 13 U/L (ref 0–53)
AST: 17 U/L (ref 0–37)
Albumin: 3.7 g/dL (ref 3.5–5.2)
Alkaline Phosphatase: 97 U/L (ref 39–117)
BUN: 24 mg/dL — ABNORMAL HIGH (ref 6–23)
CO2: 29 mEq/L (ref 19–32)
Calcium: 9 mg/dL (ref 8.4–10.5)
Chloride: 107 mEq/L (ref 96–112)
Creatinine, Ser: 1.36 mg/dL (ref 0.40–1.50)
GFR: 52.48 mL/min — ABNORMAL LOW (ref >60.00)
Glucose, Bld: 80 mg/dL (ref 70–99)
Potassium: 3.9 mEq/L (ref 3.5–5.1)
Sodium: 142 mEq/L (ref 135–145)
Total Bilirubin: 0.4 mg/dL (ref 0.2–1.2)
Total Protein: 6.3 g/dL (ref 6.0–8.3)

## 2018-09-03 LAB — URINALYSIS, ROUTINE W REFLEX MICROSCOPIC
Bilirubin Urine: NEGATIVE
Hgb urine dipstick: NEGATIVE
Leukocytes, UA: NEGATIVE
Nitrite: NEGATIVE
PH: 5.5 (ref 5.0–8.0)
Specific Gravity, Urine: 1.025 (ref 1.000–1.030)
Urine Glucose: NEGATIVE
Urobilinogen, UA: 0.2 (ref 0.0–1.0)

## 2018-09-03 LAB — TROPONIN I: TNIDX: 0 ug/l (ref 0.00–0.06)

## 2018-09-03 LAB — BRAIN NATRIURETIC PEPTIDE: Pro B Natriuretic peptide (BNP): 29 pg/mL (ref 0.0–100.0)

## 2018-09-03 NOTE — Patient Instructions (Signed)

## 2018-09-03 NOTE — Progress Notes (Signed)
Subjective:  Patient ID: Nicholas Burton, male    DOB: 29-Sep-1927  Age: 85 y.o. MRN: [id number]  CC: Diabetes and Hypertension   HPI Nicholas Burton presents for f/up - He complains of a slight increase in DOE and lower extremity edema since I last saw him.  He denies chest pain, palpitations, dizziness, lightheadedness, or syncope.  Outpatient Medications Prior to Visit  Medication Sig Dispense Refill  . atorvastatin (LIPITOR) 10 MG tablet TAKE 1 TABLET DAILY 90 tablet 1  . B-D ULTRAFINE III [place] PEN 31G X 8 MM MISC USE TO CHECK BLOOD SUGAR UP TO THREE TIMES A DAY 300 each 3  . Blood Glucose Monitoring Suppl (FREESTYLE FREEDOM LITE) W/DEVICE KIT Use to check blood sugars daily Dx E11.8 1 each 0  . cholecalciferol (VITAMIN D) 1000 units tablet Take 1,000 Units by mouth daily.    . cyanocobalamin 1000 MCG tablet Take 1,000 mcg by mouth daily.    . Dietary Management Product (VASCULERA) TABS Take 1 capsule by mouth daily. 30 tablet 11  . FREESTYLE LITE test strip USE TO CHECK BLOOD SUGAR TWICE A DAY 200 each 2  . Insulin Glargine (LANTUS SOLOSTAR) 100 UNIT/ML Solostar Pen Inject 45 Units into the skin daily. 45 mL 1  . KOMBIGLYZE XR 2.12-998 MG TB24 TAKE 2 TABLETS DAILY 180 tablet 1  . Lancets (FREESTYLE) lancets Use to check blood sugars twice day Dx E11.8 180 each 3  . Multiple Vitamins-Minerals (PRESERVISION/LUTEIN) CAPS Take 1 capsule by mouth 2 (two) times daily.      Marland Kitchen torsemide (DEMADEX) 10 MG tablet Take 1 tablet (10 mg total) by mouth daily. 90 tablet 1  . WELCHOL 3.75 g PACK MIX THE CONTENTS OF 1 PACKET IN FLUID AND DRINK DAILY 90 each 2   No facility-administered medications prior to visit.     ROS Review of Systems  Constitutional: Negative for diaphoresis, fatigue and fever.  HENT: Negative.   Eyes: Negative for visual disturbance.  Respiratory: Positive for shortness of breath. Negative for cough, chest tightness and wheezing.   Cardiovascular: Positive for leg swelling.  Negative for chest pain and palpitations.  Gastrointestinal: Negative for abdominal pain, constipation, diarrhea, nausea and vomiting.  Endocrine: Negative.   Genitourinary: Negative.  Negative for difficulty urinating, dysuria, frequency, hematuria and urgency.  Musculoskeletal: Negative.  Negative for arthralgias, back pain, myalgias and neck pain.  Skin: Negative for color change and pallor.  Neurological: Negative.  Negative for dizziness, weakness, light-headedness and numbness.  Hematological: Negative for adenopathy. Does not bruise/bleed easily.  Psychiatric/Behavioral: Negative.     Objective:  BP 140/80 (BP Location: Left Arm, Patient Position: Sitting, Cuff Size: Normal)   Pulse 78   Temp 97.7 F (36.5 C) (Oral)   Ht 5' 9"  (1.753 m)   Wt 201 lb 8 oz (91.4 kg)   SpO2 97%   BMI 29.76 kg/m   BP Readings from Last 3 Encounters:  09/03/18 140/80  05/01/18 120/78  01/29/18 112/60    Wt Readings from Last 3 Encounters:  09/03/18 201 lb 8 oz (91.4 kg)  05/01/18 203 lb 12 oz (92.4 kg)  01/29/18 210 lb (95.3 kg)    Physical Exam Vitals signs reviewed.  Constitutional:      General: He is not in acute distress.    Appearance: Normal appearance. He is not ill-appearing, toxic-appearing or diaphoretic.  HENT:     Nose: Nose normal.     Mouth/Throat:     Mouth: Mucous membranes  are moist.     Pharynx: No posterior oropharyngeal erythema.  Eyes:     General: No scleral icterus.    Conjunctiva/sclera: Conjunctivae normal.  Neck:     Musculoskeletal: Normal range of motion. No neck rigidity or muscular tenderness.  Cardiovascular:     Rate and Rhythm: Normal rate and regular rhythm.     Heart sounds: No murmur. No gallop.      Comments: EKG ---  Sinus  Rhythm  WITHIN NORMAL LIMITS  Pulmonary:     Effort: Pulmonary effort is normal.     Breath sounds: Normal breath sounds. No stridor. No wheezing, rhonchi or rales.  Abdominal:     General: Bowel sounds are  normal.     Palpations: There is no hepatomegaly, splenomegaly or mass.     Tenderness: There is no abdominal tenderness.  Musculoskeletal:        General: No swelling.     Right lower leg: Edema (1+ pitting edema) present.     Left lower leg: Edema (1+ pitting edema) present.  Skin:    General: Skin is warm and dry.     Findings: No rash.  Neurological:     General: No focal deficit present.     Mental Status: He is alert and oriented to person, place, and time. Mental status is at baseline.     Lab Results  Component Value Date   WBC 10.8 (H) 09/03/2018   HGB 12.7 (L) 09/03/2018   HCT 38.2 (L) 09/03/2018   PLT 295.0 09/03/2018   GLUCOSE 80 09/03/2018   CHOL 67 10/30/2017   TRIG 63.0 10/30/2017   HDL 29.40 (L) 10/30/2017   LDLCALC 25 10/30/2017   ALT 13 09/03/2018   AST 17 09/03/2018   NA 142 09/03/2018   K 3.9 09/03/2018   CL 107 09/03/2018   CREATININE 1.36 09/03/2018   BUN 24 (H) 09/03/2018   CO2 29 09/03/2018   TSH 3.93 10/30/2017   PSA 11.21 (H) 10/30/2017   INR 1.3 ratio (H) 08/11/2010   HGBA1C 6.7 (A) 05/01/2018   MICROALBUR <0.7 10/30/2017    Dg Chest 2 View  Result Date: 02/05/2016 CLINICAL DATA:  Melanoma. EXAM: CHEST  2 VIEW COMPARISON:  01/29/2015. FINDINGS: The cardiac silhouette, mediastinal and hilar contours are within normal limits for age and stable. There is mild tortuosity of the thoracic aorta. Stable basilar scarring changes. No infiltrates, edema or effusions. No worrisome pulmonary lesions. The bony thorax is intact. IMPRESSION: No acute cardiopulmonary findings no worrisome pulmonary lesions. Stable basilar scarring changes. Electronically Signed   By: Marijo Sanes M.D.   On: 02/05/2016 14:08    Assessment & Plan:   Nicholas Burton was seen today for diabetes and hypertension.  Diagnoses and all orders for this visit:  DOE (dyspnea on exertion)- His EKG is normal.  BNP and troponin are both normal.  The only thing remarkable on his lab is a slight  decrease in his H&H which may explain the shortness of breath.  Also, I think there is some degree of deconditioning and advancing age.  I do not see any evidence of a cardiovascular cause at this time. -     CBC with Differential/Platelet; Future -     Troponin I -; Future -     Brain natriuretic peptide; Future -     Comprehensive metabolic panel; Future -     Urinalysis, Routine w reflex microscopic; Future -     EKG 12-Lead  Localized edema- As  above -     Troponin I -; Future -     Brain natriuretic peptide; Future -     Comprehensive metabolic panel; Future -     Urinalysis, Routine w reflex microscopic; Future -     EKG 12-Lead  B12 deficiency- Will continue parenteral B12 replacement therapy. -     CBC with Differential/Platelet; Future  Type II diabetes mellitus with manifestations ([place])- His A1c is at 6.7%.  His blood sugars are adequately well controlled. -     Comprehensive metabolic panel; Future  Essential hypertension- His blood pressure is adequately well controlled.  Electrolytes and renal function are normal. -     Comprehensive metabolic panel; Future -     Urinalysis, Routine w reflex microscopic; Future   I am having [name]. Born maintain his PRESERVISION/LUTEIN, FREESTYLE FREEDOM LITE, freestyle, cyanocobalamin, cholecalciferol, B-D ULTRAFINE III Kristensen PEN, FREESTYLE LITE, WELCHOL, VASCULERA, torsemide, atorvastatin, KOMBIGLYZE XR, and Insulin Glargine.  No orders of the defined types were placed in this encounter.    Follow-up: Return in about 4 months (around 01/02/2019).  Scarlette Calico, MD

## 2018-09-04 ENCOUNTER — Encounter: Payer: Self-pay | Admitting: Internal Medicine

## 2018-09-28 DIAGNOSIS — H353211 Exudative age-related macular degeneration, right eye, with active choroidal neovascularization: Secondary | ICD-10-CM | POA: Diagnosis not present

## 2018-09-28 DIAGNOSIS — E119 Type 2 diabetes mellitus without complications: Secondary | ICD-10-CM | POA: Diagnosis not present

## 2018-09-28 DIAGNOSIS — H35371 Puckering of macula, right eye: Secondary | ICD-10-CM | POA: Diagnosis not present

## 2018-09-28 DIAGNOSIS — H353223 Exudative age-related macular degeneration, left eye, with inactive scar: Secondary | ICD-10-CM | POA: Diagnosis not present

## 2018-10-25 ENCOUNTER — Other Ambulatory Visit (INDEPENDENT_AMBULATORY_CARE_PROVIDER_SITE_OTHER): Payer: Medicare Other

## 2018-10-25 ENCOUNTER — Encounter: Payer: Self-pay | Admitting: Internal Medicine

## 2018-10-25 ENCOUNTER — Ambulatory Visit (INDEPENDENT_AMBULATORY_CARE_PROVIDER_SITE_OTHER): Payer: Medicare Other | Admitting: Internal Medicine

## 2018-10-25 VITALS — BP 138/80 | HR 88 | Temp 98.0°F | Ht 69.0 in | Wt 206.5 lb

## 2018-10-25 DIAGNOSIS — N183 Chronic kidney disease, stage 3 unspecified: Secondary | ICD-10-CM

## 2018-10-25 DIAGNOSIS — R972 Elevated prostate specific antigen [PSA]: Secondary | ICD-10-CM

## 2018-10-25 DIAGNOSIS — D508 Other iron deficiency anemias: Secondary | ICD-10-CM | POA: Diagnosis not present

## 2018-10-25 DIAGNOSIS — R0609 Other forms of dyspnea: Secondary | ICD-10-CM | POA: Diagnosis not present

## 2018-10-25 DIAGNOSIS — D539 Nutritional anemia, unspecified: Secondary | ICD-10-CM | POA: Diagnosis not present

## 2018-10-25 DIAGNOSIS — E118 Type 2 diabetes mellitus with unspecified complications: Secondary | ICD-10-CM

## 2018-10-25 DIAGNOSIS — E785 Hyperlipidemia, unspecified: Secondary | ICD-10-CM | POA: Diagnosis not present

## 2018-10-25 DIAGNOSIS — I10 Essential (primary) hypertension: Secondary | ICD-10-CM

## 2018-10-25 DIAGNOSIS — R600 Localized edema: Secondary | ICD-10-CM

## 2018-10-25 DIAGNOSIS — E538 Deficiency of other specified B group vitamins: Secondary | ICD-10-CM | POA: Diagnosis not present

## 2018-10-25 DIAGNOSIS — N2889 Other specified disorders of kidney and ureter: Secondary | ICD-10-CM

## 2018-10-25 DIAGNOSIS — E559 Vitamin D deficiency, unspecified: Secondary | ICD-10-CM | POA: Diagnosis not present

## 2018-10-25 HISTORY — DX: Nutritional anemia, unspecified: D53.9

## 2018-10-25 HISTORY — DX: Chronic kidney disease, stage 3 unspecified: N18.30

## 2018-10-25 HISTORY — DX: Elevated prostate specific antigen (PSA): R97.20

## 2018-10-25 LAB — LIPID PANEL
Cholesterol: 87 mg/dL (ref 0–200)
HDL: 32.4 mg/dL — ABNORMAL LOW (ref >39.00)
LDL Cholesterol: 30 mg/dL (ref 0–99)
NONHDL: 54.12
Total CHOL/HDL Ratio: 3
Triglycerides: 120 mg/dL (ref 0.0–149.0)
VLDL: 24 mg/dL (ref 0.0–40.0)

## 2018-10-25 LAB — HEMOGLOBIN A1C: Hgb A1c MFr Bld: 6.4 % (ref 4.6–6.5)

## 2018-10-25 LAB — CBC WITH DIFFERENTIAL/PLATELET
BASOS ABS: 0.1 10*3/uL (ref 0.0–0.1)
Basophils Relative: 0.6 % (ref 0.0–3.0)
Eosinophils Absolute: 0.3 10*3/uL (ref 0.0–0.7)
Eosinophils Relative: 2.7 % (ref 0.0–5.0)
HCT: 38.9 % — ABNORMAL LOW (ref 39.0–52.0)
Hemoglobin: 13 g/dL (ref 13.0–17.0)
LYMPHS ABS: 3.5 10*3/uL (ref 0.7–4.0)
Lymphocytes Relative: 35.1 % (ref 12.0–46.0)
MCHC: 33.5 g/dL (ref 30.0–36.0)
MCV: 85.3 fl (ref 78.0–100.0)
Monocytes Absolute: 1 10*3/uL (ref 0.1–1.0)
Monocytes Relative: 10 % (ref 3.0–12.0)
Neutro Abs: 5.1 10*3/uL (ref 1.4–7.7)
Neutrophils Relative %: 51.6 % (ref 43.0–77.0)
Platelets: 251 10*3/uL (ref 150.0–400.0)
RBC: 4.57 Mil/uL (ref 4.22–5.81)
RDW: 14.6 % (ref 11.5–15.5)
WBC: 10 10*3/uL (ref 4.0–10.5)

## 2018-10-25 LAB — URINALYSIS, ROUTINE W REFLEX MICROSCOPIC
Bilirubin Urine: NEGATIVE
Hgb urine dipstick: NEGATIVE
Leukocytes,Ua: NEGATIVE
Nitrite: NEGATIVE
RBC / HPF: NONE SEEN (ref 0–?)
Specific Gravity, Urine: 1.025 (ref 1.000–1.030)
Urine Glucose: NEGATIVE
Urobilinogen, UA: 0.2 (ref 0.0–1.0)
pH: 5.5 (ref 5.0–8.0)

## 2018-10-25 LAB — FOLATE

## 2018-10-25 LAB — COMPREHENSIVE METABOLIC PANEL
ALT: 13 U/L (ref 0–53)
AST: 18 U/L (ref 0–37)
Albumin: 4 g/dL (ref 3.5–5.2)
Alkaline Phosphatase: 104 U/L (ref 39–117)
BUN: 25 mg/dL — ABNORMAL HIGH (ref 6–23)
CO2: 31 mEq/L (ref 19–32)
Calcium: 9.1 mg/dL (ref 8.4–10.5)
Chloride: 105 mEq/L (ref 96–112)
Creatinine, Ser: 1.52 mg/dL — ABNORMAL HIGH (ref 0.40–1.50)
GFR: 43.41 mL/min — ABNORMAL LOW (ref >60.00)
Glucose, Bld: 105 mg/dL — ABNORMAL HIGH (ref 70–99)
Potassium: 3.8 mEq/L (ref 3.5–5.1)
Sodium: 143 mEq/L (ref 135–145)
Total Bilirubin: 0.5 mg/dL (ref 0.2–1.2)
Total Protein: 6.3 g/dL (ref 6.0–8.3)

## 2018-10-25 LAB — IBC PANEL
Iron: 81 ug/dL (ref 42–165)
Saturation Ratios: 21.3 % (ref 20.0–50.0)
Transferrin: 272 mg/dL (ref 212.0–360.0)

## 2018-10-25 LAB — VITAMIN B12

## 2018-10-25 LAB — VITAMIN D 25 HYDROXY (VIT D DEFICIENCY, FRACTURES): VITD: 30.37 ng/mL (ref 30.00–100.00)

## 2018-10-25 LAB — TSH: TSH: 3.85 u[IU]/mL (ref 0.35–4.50)

## 2018-10-25 LAB — MICROALBUMIN / CREATININE URINE RATIO
Creatinine,U: 189 mg/dL
Microalb Creat Ratio: 0.4 mg/g (ref 0.0–30.0)
Microalb, Ur: 0.8 mg/dL (ref 0.0–1.9)

## 2018-10-25 LAB — FERRITIN: Ferritin: 21.9 ng/mL — ABNORMAL LOW (ref 22.0–322.0)

## 2018-10-25 NOTE — Patient Instructions (Signed)

## 2018-10-25 NOTE — Progress Notes (Signed)
Subjective:  Patient ID: Nicholas Burton, male    DOB: 06-16-1928  Age: 85 y.o. MRN: [id number]  CC: Anemia   HPI CHAMPION CORALES presents for f/up - His lower extremity edema has improved some but he complains that the diuretic is causing frequent urination and he does not want to increase the dose.  He continues to complain of fatigue, SOB, and DOE.  Outpatient Medications Prior to Visit  Medication Sig Dispense Refill  . atorvastatin (LIPITOR) 10 MG tablet TAKE 1 TABLET DAILY 90 tablet 1  . B-D ULTRAFINE III Yum PEN 31G X 8 MM MISC USE TO CHECK BLOOD SUGAR UP TO THREE TIMES A DAY 300 each 3  . Blood Glucose Monitoring Suppl (FREESTYLE FREEDOM LITE) W/DEVICE KIT Use to check blood sugars daily Dx E11.8 1 each 0  . cholecalciferol (VITAMIN D) 1000 units tablet Take 1,000 Units by mouth daily.    . cyanocobalamin 1000 MCG tablet Take 1,000 mcg by mouth daily.    . Dietary Management Product (VASCULERA) TABS Take 1 capsule by mouth daily. 30 tablet 11  . FREESTYLE LITE test strip USE TO CHECK BLOOD SUGAR TWICE A DAY 200 each 2  . Insulin Glargine (LANTUS SOLOSTAR) 100 UNIT/ML Solostar Pen Inject 45 Units into the skin daily. 45 mL 1  . KOMBIGLYZE XR 2.12-998 MG TB24 TAKE 2 TABLETS DAILY 180 tablet 1  . Lancets (FREESTYLE) lancets Use to check blood sugars twice day Dx E11.8 180 each 3  . Multiple Vitamins-Minerals (PRESERVISION/LUTEIN) CAPS Take 1 capsule by mouth 2 (two) times daily.      Marland Kitchen torsemide (DEMADEX) 10 MG tablet Take 1 tablet (10 mg total) by mouth daily. 90 tablet 1  . WELCHOL 3.75 g PACK MIX THE CONTENTS OF 1 PACKET IN FLUID AND DRINK DAILY 90 each 2   No facility-administered medications prior to visit.     ROS Review of Systems  Constitutional: Positive for fatigue and unexpected weight change. Negative for diaphoresis.  Eyes: Negative for visual disturbance.  Respiratory: Positive for shortness of breath. Negative for cough, chest tightness and wheezing.     Cardiovascular: Positive for leg swelling. Negative for chest pain and palpitations.  Gastrointestinal: Negative for abdominal pain, diarrhea, nausea and vomiting.  Genitourinary: Positive for frequency. Negative for difficulty urinating and hematuria.  Musculoskeletal: Negative.  Negative for arthralgias and myalgias.  Skin: Negative.  Negative for color change, pallor and rash.  Neurological: Positive for weakness. Negative for dizziness and light-headedness.  Hematological: Negative for adenopathy. Does not bruise/bleed easily.  Psychiatric/Behavioral: Negative.     Objective:  BP 138/80 (BP Location: Left Arm, Patient Position: Sitting, Cuff Size: Normal)   Pulse 88   Temp 98 F (36.7 C) (Oral)   Ht 5' 9"  (1.753 m)   Wt 206 lb 8 oz (93.7 kg)   SpO2 97%   BMI 30.49 kg/m   BP Readings from Last 3 Encounters:  10/25/18 138/80  09/03/18 140/80  05/01/18 120/78    Wt Readings from Last 3 Encounters:  10/25/18 206 lb 8 oz (93.7 kg)  09/03/18 201 lb 8 oz (91.4 kg)  05/01/18 203 lb 12 oz (92.4 kg)    Physical Exam Constitutional:      Appearance: He is not ill-appearing or diaphoretic.  HENT:     Nose: Nose normal. No congestion.     Mouth/Throat:     Pharynx: Oropharynx is clear. No oropharyngeal exudate or posterior oropharyngeal erythema.  Eyes:  General: No scleral icterus.    Conjunctiva/sclera: Conjunctivae normal.  Neck:     Musculoskeletal: Normal range of motion and neck supple.  Cardiovascular:     Rate and Rhythm: Normal rate and regular rhythm.     Heart sounds: No murmur. No friction rub. No gallop.   Pulmonary:     Effort: Pulmonary effort is normal.     Breath sounds: Normal breath sounds. No wheezing or rales.  Abdominal:     General: Abdomen is flat. Bowel sounds are normal.     Palpations: There is no hepatomegaly, splenomegaly or mass.     Tenderness: There is no abdominal tenderness.  Musculoskeletal:     Right lower leg: 2+ Edema present.      Left lower leg: 2+ Edema present.  Skin:    General: Skin is warm and dry.  Neurological:     General: No focal deficit present.     Mental Status: He is oriented to person, place, and time. Mental status is at baseline.     Lab Results  Component Value Date   WBC 10.0 10/25/2018   HGB 13.0 10/25/2018   HCT 38.9 (L) 10/25/2018   PLT 251.0 10/25/2018   GLUCOSE 105 (H) 10/25/2018   CHOL 87 10/25/2018   TRIG 120.0 10/25/2018   HDL 32.40 (L) 10/25/2018   LDLCALC 30 10/25/2018   ALT 13 10/25/2018   AST 18 10/25/2018   NA 143 10/25/2018   K 3.8 10/25/2018   CL 105 10/25/2018   CREATININE 1.52 (H) 10/25/2018   BUN 25 (H) 10/25/2018   CO2 31 10/25/2018   TSH 3.85 10/25/2018   PSA 11.21 (H) 10/30/2017   INR 1.3 ratio (H) 08/11/2010   HGBA1C 6.4 10/25/2018   MICROALBUR 0.8 10/25/2018    Dg Chest 2 View  Result Date: 02/05/2016 CLINICAL DATA:  Melanoma. EXAM: CHEST  2 VIEW COMPARISON:  01/29/2015. FINDINGS: The cardiac silhouette, mediastinal and hilar contours are within normal limits for age and stable. There is mild tortuosity of the thoracic aorta. Stable basilar scarring changes. No infiltrates, edema or effusions. No worrisome pulmonary lesions. The bony thorax is intact. IMPRESSION: No acute cardiopulmonary findings no worrisome pulmonary lesions. Stable basilar scarring changes. Electronically Signed   By: Marijo Sanes M.D.   On: 02/05/2016 14:08    Assessment & Plan:   Erubiel was seen today for anemia.  Diagnoses and all orders for this visit:  Type II diabetes mellitus with manifestations ([place])- His A1c is at 6.4%.  His blood sugars are adequately well controlled. -     Comprehensive metabolic panel; Future -     Hemoglobin A1c; Future -     Microalbumin / creatinine urine ratio; Future  Essential hypertension- His blood pressure is well controlled. -     Comprehensive metabolic panel; Future -     TSH; Future -     Urinalysis, Routine w reflex microscopic;  Future  B12 deficiency -     CBC with Differential/Platelet; Future -     Folate; Future -     Vitamin B12; Future  Hyperlipidemia with target LDL less than 100- He has achieved his LDL goal and is doing well on the statin. -     Lipid panel; Future  Vitamin D deficiency -     TSH; Future -     VITAMIN D 25 Hydroxy (Vit-D Deficiency, Fractures); Future  Deficiency anemia- His H&H remains mildly low and he is symptomatic.  He has also  developed mild iron deficiency.  I have asked him to start taking an oral iron supplement to treat this. -     CBC with Differential/Platelet; Future -     Folate; Future -     Ferritin; Future -     Vitamin B12; Future -     IBC panel; Future -     Vitamin B1; Future  DOE (dyspnea on exertion)- I have asked him to undergo an echocardiogram to see if he has developed valvular disease, DD or a wall motion abnormality to explain his symptoms. -     ECHOCARDIOGRAM COMPLETE; Future  Localized edema -     ECHOCARDIOGRAM COMPLETE; Future  PSA elevation- His PSA is lower than it was previously.  This is reassuring that he does not have prostate cancer. -     PSA, total and free; Future  CRI (chronic renal insufficiency), stage 3 (moderate) (HCC)- His renal function is stable.  He agrees to avoid nephrotoxic agents.   I am having [name]. [place] start on ferrous sulfate. I am also having him maintain his PRESERVISION/LUTEIN, FREESTYLE FREEDOM LITE, freestyle, cyanocobalamin, cholecalciferol, B-D ULTRAFINE III Kramlich PEN, FREESTYLE LITE, WELCHOL, VASCULERA, torsemide, atorvastatin, KOMBIGLYZE XR, and Insulin Glargine.  Meds ordered this encounter  Medications  . ferrous sulfate 325 (65 FE) MG tablet    Sig: Take 1 tablet (325 mg total) by mouth 2 (two) times daily with a meal.    Dispense:  180 tablet    Refill:  1     Follow-up: Return in about 6 weeks (around 12/06/2018).  Scarlette Calico, MD

## 2018-10-26 DIAGNOSIS — H3581 Retinal edema: Secondary | ICD-10-CM | POA: Diagnosis not present

## 2018-10-26 DIAGNOSIS — H353223 Exudative age-related macular degeneration, left eye, with inactive scar: Secondary | ICD-10-CM | POA: Diagnosis not present

## 2018-10-26 DIAGNOSIS — H35371 Puckering of macula, right eye: Secondary | ICD-10-CM | POA: Diagnosis not present

## 2018-10-26 DIAGNOSIS — H353211 Exudative age-related macular degeneration, right eye, with active choroidal neovascularization: Secondary | ICD-10-CM | POA: Diagnosis not present

## 2018-10-28 ENCOUNTER — Encounter: Payer: Self-pay | Admitting: Internal Medicine

## 2018-10-28 LAB — VITAMIN B1: Vitamin B1 (Thiamine): 10 nmol/L (ref 8–30)

## 2018-10-28 LAB — PSA, TOTAL AND FREE
PSA, % Free: 19 % (calc) — ABNORMAL LOW (ref >25)
PSA, FREE: 1.3 ng/mL
PSA, Total: 6.8 ng/mL — ABNORMAL HIGH (ref <=4.0)

## 2018-10-28 MED ORDER — FERROUS SULFATE 325 (65 FE) MG PO TABS: 325.0000 mg | ORAL_TABLET | Freq: Two times a day (BID) | ORAL | 1 refills | Status: DC

## 2018-10-29 ENCOUNTER — Ambulatory Visit (HOSPITAL_COMMUNITY): Payer: Medicare Other | Attending: Internal Medicine

## 2018-10-29 DIAGNOSIS — R600 Localized edema: Secondary | ICD-10-CM | POA: Insufficient documentation

## 2018-10-29 DIAGNOSIS — R0609 Other forms of dyspnea: Secondary | ICD-10-CM | POA: Diagnosis not present

## 2018-10-30 ENCOUNTER — Other Ambulatory Visit: Payer: Self-pay | Admitting: Internal Medicine

## 2018-10-30 DIAGNOSIS — I5042 Chronic combined systolic (congestive) and diastolic (congestive) heart failure: Secondary | ICD-10-CM

## 2018-10-30 DIAGNOSIS — I5032 Chronic diastolic (congestive) heart failure: Secondary | ICD-10-CM | POA: Insufficient documentation

## 2018-10-30 HISTORY — DX: Chronic combined systolic (congestive) and diastolic (congestive) heart failure: I50.42

## 2018-12-04 ENCOUNTER — Ambulatory Visit: Payer: Medicare Other | Admitting: Internal Medicine

## 2018-12-04 DIAGNOSIS — H353211 Exudative age-related macular degeneration, right eye, with active choroidal neovascularization: Secondary | ICD-10-CM | POA: Diagnosis not present

## 2018-12-07 ENCOUNTER — Other Ambulatory Visit: Payer: Self-pay | Admitting: Internal Medicine

## 2018-12-07 DIAGNOSIS — I10 Essential (primary) hypertension: Secondary | ICD-10-CM

## 2018-12-17 ENCOUNTER — Telehealth: Payer: Self-pay

## 2018-12-17 ENCOUNTER — Telehealth: Payer: Self-pay | Admitting: Internal Medicine

## 2018-12-17 MED ORDER — GLUCOSE BLOOD VI STRP: ORAL_STRIP | 3 refills | Status: DC

## 2018-12-17 MED ORDER — GLUCOSE BLOOD VI STRP: ORAL_STRIP | 0 refills | Status: DC

## 2018-12-17 NOTE — Telephone Encounter (Signed)
Pt spouse informed rx has been sent.

## 2018-12-17 NOTE — Telephone Encounter (Signed)
Copied from [place] [id number]. Topic: Quick Communication - Rx Refill/Question >> Dec 17, 2018  8:24 AM Scherrie Gerlach wrote: Medication: FREESTYLE LITE test strip Pt is completely out of his test strips and desperatley needs.  Wife requesting 43 day to  [place], [place] [phone] (Phone) [phone] Pt given number for dr to call to expedite strips [phone]   Pt requesting [facility] term supply until they arrive 25 strips sent to [place], [place] - [place] [phone] (Phone) [phone] (Fax)  Pt test BID.  Wife was so upset he is out and worried because he could not check his sugar last night. She was crying on the phone about this.

## 2018-12-17 NOTE — Telephone Encounter (Signed)
Left message for patient to call back to discuss transitioning appt on 4/23 to a virtual visit.

## 2018-12-18 NOTE — Telephone Encounter (Signed)
Called and discussed transitioning visit on 4/23 to virtual visit. Patient only has flip phone and therefore will need to be a TELEPHONE Visit. Permission given over the phone to speak with Nicholas Burton, the patient's daughter in law. Requesting that Dr. Irish Lack call Nicholas Burton after the telephone visit.   Virtual Visit Pre-Appointment Phone Call   TELEPHONE CALL NOTE  Nicholas Burton has been deemed a candidate for a follow-up tele-health visit to limit community exposure during the Covid-19 pandemic. I spoke with the patient via phone to ensure availability of phone/video source, confirm preferred email & phone number, and discuss instructions and expectations.  I reminded Nicholas Burton to be prepared with any vital sign and/or heart rhythm information that could potentially be obtained via home monitoring, at the time of his visit. I reminded Nicholas Burton to expect a phone call prior to his visit.  Patient agrees to consent below.   Nicholas Gustin, RN 12/18/2018 5:37 PM    FULL LENGTH CONSENT FOR TELE-HEALTH VISIT   I hereby voluntarily request, consent and authorize [facility] and its employed or contracted physicians, physician assistants, nurse practitioners or other licensed health care professionals (the Practitioner), to provide me with telemedicine health care services (the "Services") as deemed necessary by the treating Practitioner. I acknowledge and consent to receive the Services by the Practitioner via telemedicine. I understand that the telemedicine visit will involve communicating with the Practitioner through live audiovisual communication technology and the disclosure of certain medical information by electronic transmission. I acknowledge that I have been given the opportunity to request an in-person assessment or other available alternative prior to the telemedicine visit and am voluntarily participating in the telemedicine visit.  I understand that I have the right to  withhold or withdraw my consent to the use of telemedicine in the course of my care at any time, without affecting my right to future care or treatment, and that the Practitioner or I may terminate the telemedicine visit at any time. I understand that I have the right to inspect all information obtained and/or recorded in the course of the telemedicine visit and may receive copies of available information for a reasonable fee.  I understand that some of the potential risks of receiving the Services via telemedicine include:  Marland Kitchen Delay or interruption in medical evaluation due to technological equipment failure or disruption; . Information transmitted may not be sufficient (e.g. poor resolution of images) to allow for appropriate medical decision making by the Practitioner; and/or  . In rare instances, security protocols could fail, causing a breach of personal health information.  Furthermore, I acknowledge that it is my responsibility to provide information about my medical history, conditions and care that is complete and accurate to the best of my ability. I acknowledge that Practitioner's advice, recommendations, and/or decision may be based on factors not within their control, such as incomplete or inaccurate data provided by me or distortions of diagnostic images or specimens that may result from electronic transmissions. I understand that the practice of medicine is not an exact science and that Practitioner makes no warranties or guarantees regarding treatment outcomes. I acknowledge that I will receive a copy of this consent concurrently upon execution via email to the email address I last provided but may also request a printed copy by calling the office of Nicholas Burton.    I understand that my insurance will be billed for this visit.   I have read or had this consent read  to me. . I understand the contents of this consent, which adequately explains the benefits and risks of the Services being  provided via telemedicine.  . I have been provided ample opportunity to ask questions regarding this consent and the Services and have had my questions answered to my satisfaction. . I give my informed consent for the services to be provided through the use of telemedicine in my medical care  By participating in this telemedicine visit I agree to the above.

## 2018-12-20 ENCOUNTER — Telehealth: Payer: Self-pay

## 2018-12-20 ENCOUNTER — Other Ambulatory Visit: Payer: Self-pay

## 2018-12-20 ENCOUNTER — Telehealth (INDEPENDENT_AMBULATORY_CARE_PROVIDER_SITE_OTHER): Payer: Medicare Other | Admitting: Interventional Cardiology

## 2018-12-20 ENCOUNTER — Encounter: Payer: Self-pay | Admitting: Interventional Cardiology

## 2018-12-20 ENCOUNTER — Other Ambulatory Visit: Payer: Self-pay | Admitting: Internal Medicine

## 2018-12-20 DIAGNOSIS — I5042 Chronic combined systolic (congestive) and diastolic (congestive) heart failure: Secondary | ICD-10-CM

## 2018-12-20 DIAGNOSIS — E118 Type 2 diabetes mellitus with unspecified complications: Secondary | ICD-10-CM

## 2018-12-20 DIAGNOSIS — N183 Chronic kidney disease, stage 3 unspecified: Secondary | ICD-10-CM

## 2018-12-20 DIAGNOSIS — E785 Hyperlipidemia, unspecified: Secondary | ICD-10-CM

## 2018-12-20 MED ORDER — LOSARTAN POTASSIUM 25 MG PO TABS: 25.0000 mg | ORAL_TABLET | Freq: Every day | ORAL | 3 refills | Status: DC

## 2018-12-20 NOTE — Patient Instructions (Signed)
Medication Instructions:  Your physician recommends that you continue on your current medications as directed. Please refer to the Current Medication list given to you today.  If you need a refill on your cardiac medications before your next appointment, please call your pharmacy.   Lab work: We will have [place] to your home and draw labs (BMET) in 1 week and perform an assessment and check vitals   If you have labs (blood work) drawn today and your tests are completely normal, you will receive your results only by: Marland Kitchen MyChart Message (if you have MyChart) OR . A paper copy in the mail If you have any lab test that is abnormal or we need to change your treatment, we will call you to review the results.  Testing/Procedures: None ordered  Follow-Up: . Follow up with Dr. Irish Lack via PHONE Visit on 01/01/19 at 4:00 PM  Any Other Special Instructions Will Be Listed Below (If Applicable).

## 2018-12-20 NOTE — Progress Notes (Signed)
Virtual Visit via Telephone Note   This visit type was conducted due to national recommendations for restrictions regarding the COVID-19 Pandemic (e.g. social distancing) in an effort to limit this patient's exposure and mitigate transmission in our community.  Due to his co-morbid illnesses, this patient is at least at moderate risk for complications without adequate follow up.  This format is felt to be most appropriate for this patient at this time.  The patient did not have access to video technology/had technical difficulties with video requiring transitioning to audio format only (telephone).  All issues noted in this document were discussed and addressed.  No physical exam could be performed with this format.  Please refer to the patient's chart for his  consent to telehealth for [facility].   Evaluation Performed:  Follow-up visit  Date:  12/20/2018   ID:  Nicholas Burton, DOB [birth year], MRN [id number]  Patient Location: Home Provider Location: Home  PCP:  [name], MD  Cardiologist:  No primary care provider on file. [place] Electrophysiologist:  None   Chief Complaint:  Chronic systolic heart failure  History of Present Illness:    Nicholas Burton is a 85 y.o. male who is being seen today for the evaluation of CHF at the request of [name], MD.  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  You was working around the house and had more [place].  No CP.    Denies :  Dizziness. Leg edema. Nitroglycerin use. Orthopnea. Palpitations. Paroxysmal nocturnal dyspnea. Syncope.   He had an echo showing EF 35%.    He was started on a diuretic.  Since that time, he has felt fairly well.  He continues to work and walk around the house.  He denies any discomfort in his chest.  With heavy lifting, he can have some dyspnea.  No problems with routine activities.  Denies : Chest pain. Dizziness. Leg edema. Nitroglycerin use. Orthopnea. Palpitations.  Paroxysmal nocturnal dyspnea. Shortness of breath. Syncope.     The patient does not have symptoms concerning for COVID-19 infection (fever, chills, cough, or new shortness of breath).    Past Medical History:  Diagnosis Date  . Allergy   . ANEMIA ASSOCIATED W/OTHER [facility] NUTRITIONAL DEFIC 08/11/2010   Qualifier: Diagnosis of  By: Ronnald Ramp MD, [name].   . Cancer ([place])    skin  . Cataract   . Chronic combined systolic and diastolic congestive heart failure ([place]) 10/30/2018  . COPD with asthma ([place]) 03/02/2017  . CRI (chronic renal insufficiency), stage 3 (moderate) ([place]) 10/25/2018  . Deficiency anemia 10/25/2018  . Diabetes mellitus    type 2  . DOE (dyspnea on exertion) 09/03/2018  . Edema 06/11/2009   Qualifier: Diagnosis of  By: Ronnald Ramp MD, [name].   . History of skin cancer   . Hyperlipidemia with target LDL less than 100 11/10/2008       . Hypertension   . Memory loss 05/14/2009  . Obesity (BMI 30.0-34.9) 04/01/2013  . Osteoarthritis   . PSA elevation 10/25/2018  . SKIN CANCER, HX OF 11/10/2008   Qualifier: Diagnosis of  By: Ronnald Ramp MD, [name].   . Snoring 04/03/2014  . Type II diabetes mellitus with manifestations ([place]) 11/10/2008   Estimated Creatinine Clearance: 38 mL/min (A) (by C-G formula based on SCr of 1.52 mg/dL (H)).   . Venous stasis dermatitis of both lower extremities 01/29/2018  . Vitamin D deficiency 11/10/2008  Past Surgical History:  Procedure Laterality Date  . CHOLECYSTECTOMY    . INGUINAL HERNIA REPAIR     x 3  . JOINT REPLACEMENT    . KNEE SURGERY     x 2  . MELANOMA EXCISION     x 3 -- Left arm  . TOTAL KNEE ARTHROPLASTY     x 3     Current Meds  Medication Sig  . atorvastatin (LIPITOR) 10 MG tablet TAKE 1 TABLET DAILY  . B-D ULTRAFINE III [place] PEN 31G X 8 MM MISC USE TO CHECK BLOOD SUGAR UP TO THREE TIMES A DAY  . Blood Glucose Monitoring Suppl (FREESTYLE FREEDOM LITE) W/DEVICE KIT Use to check blood sugars daily Dx E11.8  . cholecalciferol (VITAMIN  D) 1000 units tablet Take 1,000 Units by mouth daily.  . Colesevelam HCl 3.75 g PACK MIX THE CONTENTS OF 1 PACKET IN FLUID AND DRINK DAILY  . cyanocobalamin 1000 MCG tablet Take 1,000 mcg by mouth daily.  . Dietary Management Product (VASCULERA) TABS Take 1 capsule by mouth daily.  . ferrous sulfate 325 (65 FE) MG tablet Take 1 tablet (325 mg total) by mouth 2 (two) times daily with a meal.  . glucose blood (FREESTYLE LITE) test strip Use as instructed to check blood sugar bid. DX: E11.9  . Insulin Glargine (LANTUS SOLOSTAR) 100 UNIT/ML Solostar Pen Inject 45 Units into the skin daily.  Marland Kitchen KOMBIGLYZE XR 2.12-998 MG TB24 TAKE 2 TABLETS DAILY  . Lancets (FREESTYLE) lancets Use to check blood sugars twice day Dx E11.8  . Multiple Vitamins-Minerals (PRESERVISION/LUTEIN) CAPS Take 1 capsule by mouth 2 (two) times daily.    Marland Kitchen torsemide (DEMADEX) 10 MG tablet TAKE 1 TABLET DAILY     Allergies:   Ace inhibitors; Oxycodone-acetaminophen; and Penicillins   Social History   Tobacco Use  . Smoking status: Never Smoker  . Smokeless tobacco: Never Used  Substance Use Topics  . Alcohol use: No  . Drug use: No     Family Hx: The patient's family history includes Cancer in his brother and sister; Diabetes in his mother, son, and another family member; Heart disease in his father and mother.  ROS:   Please see the history of present illness.     All other systems reviewed and are negative.   Prior CV studies:   The following studies were reviewed today:  Echo result reviewed  Labs/Other Tests and Data Reviewed:    EKG:  An ECG dated January 2020 was personally reviewed today and demonstrated:  Normal sinus rhythm, no significant ST changes  Recent Labs: 09/03/2018: Pro B Natriuretic peptide (BNP) 29.0 10/25/2018: ALT 13; BUN 25; Creatinine, Ser 1.52; Hemoglobin 13.0; Platelets 251.0; Potassium 3.8; Sodium 143; TSH 3.85   Recent Lipid Panel Lab Results  Component Value Date/Time   CHOL 87  10/25/2018 11:38 AM   TRIG 120.0 10/25/2018 11:38 AM   HDL 32.40 (L) 10/25/2018 11:38 AM   CHOLHDL 3 10/25/2018 11:38 AM   LDLCALC 30 10/25/2018 11:38 AM    Wt Readings from Last 3 Encounters:  12/20/18 205 lb (93 kg)  10/25/18 206 lb 8 oz (93.7 kg)  09/03/18 201 lb 8 oz (91.4 kg)     Objective:    Vital Signs:  BP (!) 147/86   Pulse 83   Ht _0  (1.753 m)   Wt 205 lb (93 kg)   BMI 30.27 kg/m    VITAL SIGNS:  reviewed GEN:  no acute distress RESPIRATORY:  No apparent shortness of breath PSYCH:  normal affect Exam limited due to video format  ASSESSMENT & PLAN:    1. Chronic systolic heart failure: He appears euvolemic.  We discussed work-up of heart failure.  I went over the possibilities including cardiac catheterization versus stress testing versus medical therapy.  Given his age and lack of symptoms, I would lean against cardiac catheterization.  I discussed this with his daughter-in-law, Cecille Rubin who works in our office.  She agreed that he seems to be getting around quite well.  She was going to talk more to the family but felt they were going to lean against invasive management.  We will start losartan 25 mg daily.  We will get labs in about a week.  If his blood pressure stays controlled, would add beta-blocker.  Plan to recheck echocardiogram in about 3 months. 2. Chronic renal insufficiency: This limits our titrating and adding certain medications for his heart failure.  Will follow with lab work. 3. Diabetes: Continue to be active.  Healthy diet as well.  This certainly adds to the possibility that he has coronary artery disease.  Given his lack of symptoms, will continue medical therapy. 4. If he developed anginal symptoms, could consider a more aggressive, invasive work-up.  COVID-19 Education: The signs and symptoms of COVID-19 were discussed with the patient and how to seek care for testing (follow up with PCP or arrange E-visit).  The importance of social distancing  was discussed today.  Time:   Today, I have spent 25 minutes with the patient with telehealth technology discussing the above problems.     Medication Adjustments/Labs and Tests Ordered: Current medicines are reviewed at length with the patient today.  Concerns regarding medicines are outlined above.   Tests Ordered: No orders of the defined types were placed in this encounter.   Medication Changes: No orders of the defined types were placed in this encounter.   Disposition:  Follow up in 2 week(s)  Signed, Larae Grooms, MD  12/20/2018 2:09 PM    [place]

## 2018-12-20 NOTE — Telephone Encounter (Signed)
[place] Visit Request  Agency Requested:  [facility] Contact:  Darnell Level, NP Phone #:  [phone] Fax #:  [phone]  Patient Information: Name:  Nicholas Burton  DOB:  10-Aug-1928  MRN:  [id number]  Address:   [place] [address]  Phone Numbers:   Home Phone [phone]     Requesting Provider:   Casandra Doffing, MD  Diagnosis:   CHF  Reason for Visit:   Follow up on CHF  Services Requested:  Vital Signs (BP, Pulse, O2, Weight)  Physical Exam  Labs:  BMET in 1 week  # of Visits Needed/Frequency per Week: One time

## 2018-12-26 ENCOUNTER — Telehealth: Payer: Self-pay | Admitting: *Deleted

## 2018-12-26 ENCOUNTER — Other Ambulatory Visit: Payer: Self-pay

## 2018-12-26 DIAGNOSIS — Z0000 Encounter for general adult medical examination without abnormal findings: Secondary | ICD-10-CM | POA: Diagnosis not present

## 2018-12-26 NOTE — Telephone Encounter (Addendum)
   Incoming Triage Call Report from [place] Visit  Patient Name: Nicholas Burton MRN: [id number] DOB: May 22, 1928 Date of Home Visit: 12/26/2018 Requesting Provider: [place] Provider Name Giving Report: Glory Buff, NP  Patient's Vital Signs:  Temp - 97.6 HR - 92 Resp - 18 Height - 5'9" Weight - 195 pds O2 - 97% ra BP - 142/83 Alive cor reading normal  Labs Drawn Today: BMET  Clinical Assessment/Concerns:  Pt c/o LE swelling RLE - 29 cm LLE 27 cm Abd 114 cm Regular irregular heart rhythm BLEE 3+ pitting - R < L Distended abd (feels this is "normal old man belly")   Follow-up Recommendations:  Increase Torsemide pending lab   NOTE: This call should be routed to referring cardiology provider. If provider is out of the office and there is an urgent question, the pre-op APP should be contacted for further recommendations.

## 2018-12-28 ENCOUNTER — Ambulatory Visit: Payer: Medicare Other

## 2018-12-28 LAB — BASIC METABOLIC PANEL
BUN/Creatinine Ratio: 17 (ref 10–24)
BUN: 22 mg/dL (ref 8–27)
CO2: 22 mmol/L (ref 20–29)
Calcium: 9 mg/dL (ref 8.6–10.2)
Chloride: 106 mmol/L (ref 96–106)
Creatinine, Ser: 1.33 mg/dL — ABNORMAL HIGH (ref 0.76–1.27)
GFR calc Af Amer: 55 mL/min/{1.73_m2} — ABNORMAL LOW (ref >59)
GFR calc non Af Amer: 47 mL/min/{1.73_m2} — ABNORMAL LOW (ref >59)
Glucose: 79 mg/dL (ref 65–99)
Potassium: 4.7 mmol/L (ref 3.5–5.2)
Sodium: 145 mmol/L — ABNORMAL HIGH (ref 134–144)

## 2018-12-28 NOTE — Telephone Encounter (Signed)
After our [facility] collaboration meeting, we identified that there were 2 patients whose labs did not cross over to Epic. [name] was one of them. He did not have an order for the BMET in [place]. I spoke with Lanny Hurst with our lab who was able to read off labs:  Glucose 79 BUN 22 Cr 1.33 Na 145 K 4.7 Cl 106 CO2 22 Calcium 9.0  Will route to Dr. Irish Lack and his nurse for further review and action as suggested in [place]'s assessment below.  Note: If a repeat visit is requested, please make sure to fax this running note with any med changes and f/u requests to Glory Buff (just like original referral request). Lab orders will need to be placed and released as "lab collect."  Melina Copa PA-C

## 2018-12-30 NOTE — Telephone Encounter (Signed)
Labs controlled on losartan.  How is BP and HR.  WiIl consider adding beta blocker.

## 2019-01-01 ENCOUNTER — Encounter: Payer: Self-pay | Admitting: Interventional Cardiology

## 2019-01-01 ENCOUNTER — Telehealth (INDEPENDENT_AMBULATORY_CARE_PROVIDER_SITE_OTHER): Payer: Medicare Other | Admitting: Interventional Cardiology

## 2019-01-01 ENCOUNTER — Other Ambulatory Visit: Payer: Self-pay

## 2019-01-01 VITALS — BP 128/93 | HR 83 | Ht 69.0 in | Wt 205.0 lb

## 2019-01-01 DIAGNOSIS — N183 Chronic kidney disease, stage 3 unspecified: Secondary | ICD-10-CM

## 2019-01-01 DIAGNOSIS — I10 Essential (primary) hypertension: Secondary | ICD-10-CM

## 2019-01-01 DIAGNOSIS — I5042 Chronic combined systolic (congestive) and diastolic (congestive) heart failure: Secondary | ICD-10-CM | POA: Diagnosis not present

## 2019-01-01 DIAGNOSIS — I5022 Chronic systolic (congestive) heart failure: Secondary | ICD-10-CM

## 2019-01-01 MED ORDER — CARVEDILOL 3.125 MG PO TABS: 3.1250 mg | ORAL_TABLET | Freq: Two times a day (BID) | ORAL | 3 refills | Status: DC

## 2019-01-01 NOTE — Patient Instructions (Signed)
Medication Instructions:  Your physician has recommended you make the following change in your medication:   START: carvedilol (coreg) 3.125 mg tablet: Take 1 tabelt by mouth twice a day  Lab work: None Ordered  If you have labs (blood work) drawn today and your tests are completely normal, you will receive your results only by: Marland Kitchen MyChart Message (if you have MyChart) OR . A paper copy in the mail If you have any lab test that is abnormal or we need to change your treatment, we will call you to review the results.  Testing/Procedures: Your physician has requested that you have an echocardiogram on 04/01/19 at 11:30 AM. Arrive at 11:00 AM. Echocardiography is a painless test that uses sound waves to create images of your heart. It provides your doctor with information about the size and shape of your heart and how well your heart's chambers and valves are working. This procedure takes approximately one hour. There are no restrictions for this procedure.  Follow-Up: . Follow up with Dr. Irish Lack on 04/08/19 at 3:20 PM.  Any Other Special Instructions Will Be Listed Below (If Applicable).   Carvedilol tablets What is this medicine? CARVEDILOL (KAR ve dil ol) is a beta-blocker. Beta-blockers reduce the workload on the heart and help it to beat more regularly. This medicine is used to treat high blood pressure and heart failure. This medicine may be used for other purposes; ask your health care provider or pharmacist if you have questions. COMMON BRAND NAME(S): Coreg What should I tell my health care provider before I take this medicine? They need to know if you have any of these conditions: -circulation problems -diabetes -history of heart attack or heart disease -liver disease -lung or breathing disease, like asthma or emphysema -pheochromocytoma -slow or irregular heartbeat -thyroid disease -an unusual or allergic reaction to carvedilol, other beta-blockers, medicines, foods, dyes, or  preservatives -pregnant or trying to get pregnant -breast-feeding How should I use this medicine? Take this medicine by mouth with a glass of water. Follow the directions on the prescription label. It is best to take the tablets with food. Take your doses at regular intervals. Do not take your medicine more often than directed. Do not stop taking except on the advice of your doctor or health care professional. Talk to your pediatrician regarding the use of this medicine in children. Special care may be needed. Overdosage: If you think you have taken too much of this medicine contact a poison control center or emergency room at once. NOTE: This medicine is only for you. Do not share this medicine with others. What if I miss a dose? If you miss a dose, take it as soon as you can. If it is almost time for your next dose, take only that dose. Do not take double or extra doses. What may interact with this medicine? This medicine may interact with the following medications: -certain medicines for blood pressure, heart disease, irregular heart beat -certain medicines for depression, like fluoxetine or paroxetine -certain medicines for diabetes, like glipizide or glyburide -cimetidine -clonidine -cyclosporine -digoxin -MAOIs like Carbex, Eldepryl, Marplan, Nardil, and Parnate -reserpine -rifampin This list may not describe all possible interactions. Give your health care provider a list of all the medicines, herbs, non-prescription drugs, or dietary supplements you use. Also tell them if you smoke, drink alcohol, or use illegal drugs. Some items may interact with your medicine. What should I watch for while using this medicine? Check your heart rate and blood pressure  regularly while you are taking this medicine. Ask your doctor or health care professional what your heart rate and blood pressure should be, and when you should contact him or her. Do not stop taking this medicine suddenly. This could  lead to serious heart-related effects. Contact your doctor or health care professional if you have difficulty breathing while taking this drug. Check your weight daily. Ask your doctor or health care professional when you should notify him/her of any weight gain. You may get drowsy or dizzy. Do not drive, use machinery, or do anything that requires mental alertness until you know how this medicine affects you. To reduce the risk of dizzy or fainting spells, do not sit or stand up quickly. Alcohol can make you more drowsy, and increase flushing and rapid heartbeats. Avoid alcoholic drinks. If you have diabetes, check your blood sugar as directed. Tell your doctor if you have changes in your blood sugar while you are taking this medicine. If you are going to have surgery, tell your doctor or health care professional that you are taking this medicine. What side effects may I notice from receiving this medicine? Side effects that you should report to your doctor or health care professional as soon as possible: -allergic reactions like skin rash, itching or hives, swelling of the face, lips, or tongue -breathing problems -dark urine -irregular heartbeat -swollen legs or ankles -vomiting -yellowing of the eyes or skin Side effects that usually do not require medical attention (report to your doctor or health care professional if they continue or are bothersome): -change in sex drive or performance -diarrhea -dry eyes (especially if wearing contact lenses) -dry, itching skin -headache -nausea -unusually tired This list may not describe all possible side effects. Call your doctor for medical advice about side effects. You may report side effects to FDA at 1-800-FDA-1088. Where should I keep my medicine? Keep out of the reach of children. Store at room temperature below 30 degrees C (86 degrees F). Protect from moisture. Keep container tightly closed. Throw away any unused medicine after the  expiration date. NOTE: This sheet is a summary. It may not cover all possible information. If you have questions about this medicine, talk to your doctor, pharmacist, or health care provider.  2019 Elsevier/Gold Standard (2013-04-21 14:12:02)  Echocardiogram An echocardiogram is a procedure that uses painless sound waves (ultrasound) to produce an image of the heart. Images from an echocardiogram can provide important information about:  Signs of coronary artery disease (CAD).  Aneurysm detection. An aneurysm is a weak or damaged part of an artery wall that bulges out from the normal force of blood pumping through the body.  Heart size and shape. Changes in the size or shape of the heart can be associated with certain conditions, including heart failure, aneurysm, and CAD.  Heart muscle function.  Heart valve function.  Signs of a past heart attack.  Fluid buildup around the heart.  Thickening of the heart muscle.  A tumor or infectious growth around the heart valves. Tell a health care provider about:  Any allergies you have.  All medicines you are taking, including vitamins, herbs, eye drops, creams, and over-the-counter medicines.  Any blood disorders you have.  Any surgeries you have had.  Any medical conditions you have.  Whether you are pregnant or may be pregnant. What are the risks? Generally, this is a safe procedure. However, problems may occur, including:  Allergic reaction to dye (contrast) that may be used during the procedure.  What happens before the procedure? No specific preparation is needed. You may eat and drink normally. What happens during the procedure?   An IV tube may be inserted into one of your veins.  You may receive contrast through this tube. A contrast is an injection that improves the quality of the pictures from your heart.  A gel will be applied to your chest.  A wand-like tool (transducer) will be moved over your chest. The gel  will help to transmit the sound waves from the transducer.  The sound waves will harmlessly bounce off of your heart to allow the heart images to be captured in real-time motion. The images will be recorded on a computer. The procedure may vary among health care providers and hospitals. What happens after the procedure?  You may return to your normal, everyday life, including diet, activities, and medicines, unless your health care provider tells you not to do that. Summary  An echocardiogram is a procedure that uses painless sound waves (ultrasound) to produce an image of the heart.  Images from an echocardiogram can provide important information about the size and shape of your heart, heart muscle function, heart valve function, and fluid buildup around your heart.  You do not need to do anything to prepare before this procedure. You may eat and drink normally.  After the echocardiogram is completed, you may return to your normal, everyday life, unless your health care provider tells you not to do that. This information is not intended to replace advice given to you by your health care provider. Make sure you discuss any questions you have with your health care provider. Document Released: 08/12/2000 Document Revised: 09/17/2016 Document Reviewed: 09/17/2016 Elsevier Interactive Patient Education  2019 [employer].

## 2019-01-01 NOTE — Telephone Encounter (Signed)
Vitals below reviewed with Dr. Irish Lack we will discuss adding BB during virtual visit today.

## 2019-01-01 NOTE — Progress Notes (Signed)
Virtual Visit via Telephone Note   This visit type was conducted due to national recommendations for restrictions regarding the COVID-19 Pandemic (e.g. social distancing) in an effort to limit this patient's exposure and mitigate transmission in our community.  Due to his co-morbid illnesses, this patient is at least at moderate risk for complications without adequate follow up.  This format is felt to be most appropriate for this patient at this time.  The patient did not have access to video technology/had technical difficulties with video requiring transitioning to audio format only (telephone).  All issues noted in this document were discussed and addressed.  No physical exam could be performed with this format.  Please refer to the patient's chart for his  consent to telehealth for [facility].   Date:  01/01/2019   ID:  Nicholas Burton, DOB 08/26/1928, MRN [id number]  Patient Location: Home Provider Location: Home  PCP:  [name], MD  Cardiologist:  No primary care provider on file. [place] Electrophysiologist:  None   Evaluation Performed:  Follow-Up Visit  Chief Complaint:  Chronic systolic heart failure  History of Present Illness:    Nicholas Burton is a 85 y.o. 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  His daughter-in-law works in our office at [facility]  Several weeks ago, he was working around [employer] and had more [place].  No CP.    He had an echo showing EF 35%.    He was started on a diuretic.  He did well with a diuretic.  I then had a telephone visit with him and an ARB was started.  We discussed cardiac catheterization versus stress testing versus medical therapy.  Given his lack of symptoms, we opted for no testing.  The patient and his wife were nervous about coming into the office for any testing regardless.  Home health came out and did some blood work showing stable creatinine.   They showed a stable blood pressure and heart rate in the 90s.  The plan was then going to be to add a beta-blocker.  He did have some mild renal insufficiency which may interfere with uptitrating ARB.  The patient does not have symptoms concerning for COVID-19 infection (fever, chills, cough, or new shortness of breath).   Denies : Chest pain. Dizziness. Leg edema. Nitroglycerin use. Orthopnea. Palpitations. Paroxysmal nocturnal dyspnea. Shortness of breath. Syncope.   Occasionally walks outside.  No cardiac issues.   Past Medical History:  Diagnosis Date  . Allergy   . ANEMIA ASSOCIATED W/OTHER [facility] NUTRITIONAL DEFIC 08/11/2010   Qualifier: Diagnosis of  By: Ronnald Ramp MD, [name].   . Cancer ([place])    skin  . Cataract   . Chronic combined systolic and diastolic congestive heart failure ([place]) 10/30/2018  . COPD with asthma ([place]) 03/02/2017  . CRI (chronic renal insufficiency), stage 3 (moderate) ([place]) 10/25/2018  . Deficiency anemia 10/25/2018  . Diabetes mellitus    type 2  . DOE (dyspnea on exertion) 09/03/2018  . Edema 06/11/2009   Qualifier: Diagnosis of  By: Ronnald Ramp MD, [name].   . History of skin cancer   . Hyperlipidemia with target LDL less than 100 11/10/2008       . Hypertension   . Memory loss 05/14/2009  . Obesity (BMI 30.0-34.9) 04/01/2013  . Osteoarthritis   . PSA elevation 10/25/2018  . SKIN CANCER, HX OF 11/10/2008  Qualifier: Diagnosis of  By: Ronnald Ramp MD, [name].   . Snoring 04/03/2014  . Type II diabetes mellitus with manifestations ([place]) 11/10/2008   Estimated Creatinine Clearance: 38 mL/min (A) (by C-G formula based on SCr of 1.52 mg/dL (H)).   . Venous stasis dermatitis of both lower extremities 01/29/2018  . Vitamin D deficiency 11/10/2008   Past Surgical History:  Procedure Laterality Date  . CHOLECYSTECTOMY    . INGUINAL HERNIA REPAIR     x 3  . JOINT REPLACEMENT    . KNEE SURGERY     x 2  . MELANOMA EXCISION     x 3 -- Left arm  . TOTAL KNEE ARTHROPLASTY     x  3     Current Meds  Medication Sig  . atorvastatin (LIPITOR) 10 MG tablet TAKE 1 TABLET DAILY  . B-D ULTRAFINE III [place] PEN 31G X 8 MM MISC USE TO CHECK BLOOD SUGAR UP TO THREE TIMES A DAY  . Blood Glucose Monitoring Suppl (FREESTYLE FREEDOM LITE) W/DEVICE KIT Use to check blood sugars daily Dx E11.8  . cholecalciferol (VITAMIN D) 1000 units tablet Take 1,000 Units by mouth daily.  . Colesevelam HCl 3.75 g PACK MIX THE CONTENTS OF 1 PACKET IN FLUID AND DRINK DAILY  . cyanocobalamin 1000 MCG tablet Take 1,000 mcg by mouth daily.  . Dietary Management Product (VASCULERA) TABS Take 1 capsule by mouth daily.  . ferrous sulfate 325 (65 FE) MG tablet Take 1 tablet (325 mg total) by mouth 2 (two) times daily with a meal.  . glucose blood (FREESTYLE LITE) test strip Use as instructed to check blood sugar bid. DX: E11.9  . Insulin Glargine (LANTUS SOLOSTAR) 100 UNIT/ML Solostar Pen Inject 45 Units into the skin daily.  Marland Kitchen KOMBIGLYZE XR 2.12-998 MG TB24 TAKE 2 TABLETS DAILY  . Lancets (FREESTYLE) lancets Use to check blood sugars twice day Dx E11.8  . losartan (COZAAR) 25 MG tablet Take 1 tablet (25 mg total) by mouth daily.  . Multiple Vitamins-Minerals (PRESERVISION/LUTEIN) CAPS Take 1 capsule by mouth 2 (two) times daily.    Marland Kitchen torsemide (DEMADEX) 10 MG tablet TAKE 1 TABLET DAILY     Allergies:   Ace inhibitors; Oxycodone-acetaminophen; and Penicillins   Social History   Tobacco Use  . Smoking status: Never Smoker  . Smokeless tobacco: Never Used  Substance Use Topics  . Alcohol use: No  . Drug use: No     Family Hx: The patient's family history includes Cancer in his brother and sister; Diabetes in his mother, son, and another family member; Heart disease in his father and mother.  ROS:   Please see the history of present illness.    No problems with new medicine All other systems reviewed and are negative.   Prior CV studies:   The following studies were reviewed today:   Echo results as noted: EF 35-40%  Labs/Other Tests and Data Reviewed:    EKG:  An ECG dated 08/2018 was personally reviewed today and demonstrated:  NSR, no ECG  Recent Labs: 09/03/2018: Pro B Natriuretic peptide (BNP) 29.0 10/25/2018: ALT 13; Hemoglobin 13.0; Platelets 251.0; TSH 3.85 12/26/2018: BUN 22; Creatinine, Ser 1.33; Potassium 4.7; Sodium 145   Recent Lipid Panel Lab Results  Component Value Date/Time   CHOL 87 10/25/2018 11:38 AM   TRIG 120.0 10/25/2018 11:38 AM   HDL 32.40 (L) 10/25/2018 11:38 AM   CHOLHDL 3 10/25/2018 11:38 AM   LDLCALC 30 10/25/2018 11:38 AM  Wt Readings from Last 3 Encounters:  01/01/19 205 lb (93 kg)  12/20/18 205 lb (93 kg)  10/25/18 206 lb 8 oz (93.7 kg)     Objective:    Vital Signs:  BP (!) 128/93   Pulse 83   Ht '5\' 9"'$  (1.753 m)   Wt 205 lb (93 kg)   BMI 30.27 kg/m    VITAL SIGNS:  reviewed RESPIRATORY:  no shortness of breath PSYCH:  normal affect exam limited by phone format  ASSESSMENT & PLAN:    1. Chronic systolic heart failure: Appears euvolemic.  No indication for cardiac cath or stress testing at this time.  Symptoms well controlled.  Encouraged him to continue to try to walk more.  We will add carvedilol 3.125 mg p.o. twice daily.  Plan for repeat echocardiogram in 3 months. 2. Chronic renal insufficiency: Repeat creatinine after losartan was started was 1.33 which was an improvement compared to prior. 3. Diabetes: Continue healthy diet.  I encouraged him to try to get 150 minutes of exercise per week. 4. HTN: fairly well controlled.   COVID-19 Education: The signs and symptoms of COVID-19 were discussed with the patient and how to seek care for testing (follow up with PCP or arrange E-visit).  The importance of social distancing was discussed today.  Time:   Today, I have spent 25 minutes with the patient with telehealth technology discussing the above problems.     Medication Adjustments/Labs and Tests Ordered:  Current medicines are reviewed at length with the patient today.  Concerns regarding medicines are outlined above.   Tests Ordered: No orders of the defined types were placed in this encounter.   Medication Changes: No orders of the defined types were placed in this encounter.   Disposition:  Follow up in 3 month(s)  Signed, Larae Grooms, MD  01/01/2019 4:03 PM    [facility]

## 2019-01-12 ENCOUNTER — Other Ambulatory Visit: Payer: Self-pay | Admitting: Internal Medicine

## 2019-01-15 DIAGNOSIS — H353211 Exudative age-related macular degeneration, right eye, with active choroidal neovascularization: Secondary | ICD-10-CM | POA: Diagnosis not present

## 2019-01-29 DIAGNOSIS — L570 Actinic keratosis: Secondary | ICD-10-CM | POA: Diagnosis not present

## 2019-01-29 DIAGNOSIS — L819 Disorder of pigmentation, unspecified: Secondary | ICD-10-CM | POA: Diagnosis not present

## 2019-01-29 DIAGNOSIS — Z85828 Personal history of other malignant neoplasm of skin: Secondary | ICD-10-CM | POA: Diagnosis not present

## 2019-02-05 ENCOUNTER — Telehealth: Payer: Self-pay | Admitting: Interventional Cardiology

## 2019-02-05 NOTE — Telephone Encounter (Signed)
Called and spoke to patient and his wife. Patient was started on carvedilol 3.125 mg BID at last visit on 01/01/19. Patient states that for the past week he has felt very lethargic and has had dizzy spells. Denies chest pain, SOB, LEE, syncope, falls, or any other Sx. Patient states that his dizzy spells occur when he is walking around and when he is bending over tilling the yard. Patient states that he is staying hydrated and his urine is light. Patient states that he stopped taking the carvedilol because he did not like how it made him feel. He did not take it yesterday or today and states that he feels better. He has not done any yard work since stopping the medicine. BPs 119/79 and 125/72 HRs 80s prior to stopping the medicine. BP and HRs are the same off of the medicine. Patient understands rationale for BB with his HF and that that is the lowest dose for that medicine. Patient educated on changing positions slowly with the medication. Patient states that it just makes him feel horrible. Patient requesting Dr. Hassell Done recommendations on whether or not he should continue medication or switch to another medicine. Will forward to Dr. Irish Lack for review and recommendation.

## 2019-02-05 NOTE — Telephone Encounter (Signed)
New Message   Pt c/o medication issue:  1. Name of Medication: carvedilol (COREG) 3.125 MG tablet   2. How are you currently taking this medication (dosage and times per day)?   3. Are you having a reaction (difficulty breathing--STAT)?   4. What is your medication issue?  Patients wife is calling because the patient stopped taking the medication on Sunday because it make him dizzy and just bad. Please call to discuss.

## 2019-02-07 NOTE — Telephone Encounter (Signed)
OK to hold Coreg at this time.  Can try metoprolol 12.5 mg BID.

## 2019-02-08 MED ORDER — METOPROLOL TARTRATE 25 MG PO TABS: 12.5000 mg | ORAL_TABLET | Freq: Two times a day (BID) | ORAL | 11 refills | Status: DC

## 2019-02-08 NOTE — Telephone Encounter (Signed)
Called and made patient's wife aware that Dr. Irish Lack was okay with the patient stopping carvedilol at this time and that patient will try metoprolol 12.5 mg BID. Rx sent to preferred pharmacy. Instructed for patient to continue to monitor BP and HR and let us know if his Sx do not improve.

## 2019-02-11 ENCOUNTER — Other Ambulatory Visit: Payer: Self-pay | Admitting: Internal Medicine

## 2019-02-11 DIAGNOSIS — I872 Venous insufficiency (chronic) (peripheral): Secondary | ICD-10-CM

## 2019-02-18 NOTE — Progress Notes (Addendum)
Subjective:   Nicholas Burton is a 85 y.o. male who presents for Medicare Annual/Subsequent preventive examination. I connected with patient by a telephone and verified that I am speaking with the correct person using two identifiers. Patient stated full name and DOB. Patient gave permission to continue with telephonic visit. Patient's location was at home and Nurse's location was at [place] office.   Review of Systems:   Cardiac Risk Factors include: advanced age (>83mn, >>6women);diabetes mellitus;dyslipidemia;male gender;hypertension Sleep patterns: gets up 1-2 times nightly to void and sleeps 8-9 hours nightly.    Home Safety/Smoke Alarms: Feels safe in home. Smoke alarms in place.  Living environment; residence and Firearm Safety: 1-story house/ trailer, equipment: [place]io producer [place]: [place] Lives with wife, no needs for DME, good support system Seat Belt Safety/Bike Helmet: Wears seat belt.   PSA-  Lab Results  Component Value Date   PSA 11.21 (H) 10/30/2017        Objective:    Vitals: There were no vitals taken for this visit.  There is no height or weight on file to calculate BMI.  Advanced Directives 02/19/2019 12/22/2017 12/19/2016 02/16/2016  Does Patient Have a Medical Advance Directive? No No No No  Would patient like information on creating a medical advance directive? No - Patient declined Yes (ED - Information included in AVS) Yes (MAU/Ambulatory/Procedural Areas - Information given) Yes - Educational materials given    Tobacco Social History   Tobacco Use  Smoking Status Never Smoker  Smokeless Tobacco Never Used     Counseling given: Not Answered  Past Medical History:  Diagnosis Date  . Allergy   . [facility]   Qualifier: Diagnosis of  By: JRonnald RampMD, TArvid Right   . Cancer ([facility]    skin  . Cataract   . Chronic combined systolic and diastolic congestive heart failure ([place] 10/30/2018  . COPD with asthma  ([facility] 03/02/2017  . CRI (chronic renal insufficiency), stage 3 (moderate) ([facility] 10/25/2018  . Deficiency anemia 10/25/2018  . Diabetes mellitus    type 2  . DOE (dyspnea on exertion) 09/03/2018  . Edema 06/11/2009   Qualifier: Diagnosis of  By: JRonnald RampMD, TArvid Right   . History of skin cancer   . Hyperlipidemia with target LDL less than 100 11/10/2008       . Hypertension   . Memory loss 05/14/2009  . Obesity (BMI 30.0-34.9) 04/01/2013  . Osteoarthritis   . PSA elevation 10/25/2018  . SKIN CANCER, HX OF 11/10/2008   Qualifier: Diagnosis of  By: JRonnald RampMD, TArvid Right   . Snoring 04/03/2014  . Type II diabetes mellitus with manifestations ([facility] 11/10/2008   Estimated Creatinine Clearance: 38 mL/min (A) (by C-G formula based on SCr of 1.52 mg/dL (H)).   . Venous stasis dermatitis of both lower extremities 01/29/2018  . Vitamin D deficiency 11/10/2008   Past Surgical History:  Procedure Laterality Date  . CHOLECYSTECTOMY    . INGUINAL HERNIA REPAIR     x 3  . JOINT REPLACEMENT    . KNEE SURGERY     x 2  . MELANOMA EXCISION     x 3 -- Left arm  . TOTAL KNEE ARTHROPLASTY     x 3   Family History  Problem Relation Age of Onset  . Heart disease Mother   . Diabetes Mother   . Heart disease Father   . Cancer Sister   . Cancer Brother   .  Diabetes Son   . Diabetes Other    Social History   Socioeconomic History  . Marital status: Married    Spouse name: Not on file  . Number of children: 4  . Years of education: Not on file  . Highest education level: Not on file  Occupational History  . Occupation: retired    Fish farm manager: RETIRED  Social Needs  . Financial resource strain: Not hard at all  . Food insecurity    Worry: Never true    Inability: Never true  . Transportation needs    Medical: No    Non-medical: No  Tobacco Use  . Smoking status: Never Smoker  . Smokeless tobacco: Never Used  Substance and Sexual Activity  . Alcohol use: No  . Drug use: No  . Sexual activity: Yes     Birth control/protection: None  Lifestyle  . Physical activity    Days per week: 0 days    Minutes per session: 0 min  . Stress: Only a little  Relationships  . Social connections    Talks on phone: More than three times a week    Gets together: More than three times a week    Attends religious service: More than 4 times per year    Active member of club or organization: Yes    Attends meetings of clubs or organizations: More than 4 times per year    Relationship status: Married  Other Topics Concern  . Not on file  Social History Narrative   No regular exercise    Outpatient Encounter Medications as of 02/19/2019  Medication Sig  . aspirin EC 81 MG tablet Take 81 mg by mouth daily.  Marland Kitchen atorvastatin (LIPITOR) 10 MG tablet TAKE 1 TABLET DAILY  . B-D ULTRAFINE III [place] PEN 31G X 8 MM MISC USE TO CHECK BLOOD SUGAR UP TO THREE TIMES A DAY  . Blood Glucose Monitoring Suppl (FREESTYLE FREEDOM LITE) W/DEVICE KIT Use to check blood sugars daily Dx E11.8  . cholecalciferol (VITAMIN D) 1000 units tablet Take 1,000 Units by mouth daily.  . Colesevelam HCl 3.75 g PACK MIX THE CONTENTS OF 1 PACKET IN FLUID AND DRINK DAILY  . cyanocobalamin 1000 MCG tablet Take 1,000 mcg by mouth daily.  . Dietary Management Product (VASCULERA) TABS TAKE ONE TABLET BY MOUTH DAILY  . ferrous sulfate 325 (65 FE) MG tablet Take 1 tablet (325 mg total) by mouth 2 (two) times daily with a meal.  . glucose blood (FREESTYLE LITE) test strip Use as instructed to check blood sugar bid. DX: E11.9  . ibuprofen (ADVIL) 200 MG tablet Take 200 mg by mouth every 6 (six) hours as needed.  . Insulin Glargine (LANTUS SOLOSTAR) 100 UNIT/ML Solostar Pen Inject 45 Units into the skin daily.  Marland Kitchen KOMBIGLYZE XR 2.12-998 MG TB24 TAKE 2 TABLETS DAILY  . Lancets (FREESTYLE) lancets Use to check blood sugars twice day Dx E11.8  . losartan (COZAAR) 25 MG tablet Take 1 tablet (25 mg total) by mouth daily.  . metoprolol tartrate (LOPRESSOR)  25 MG tablet Take 0.5 tablets (12.5 mg total) by mouth 2 (two) times daily.  . Multiple Vitamins-Minerals (PRESERVISION/LUTEIN) CAPS Take 1 capsule by mouth 2 (two) times daily.    Marland Kitchen torsemide (DEMADEX) 10 MG tablet TAKE 1 TABLET DAILY   No facility-administered encounter medications on file as of 02/19/2019.     Activities of Daily Living In your present state of health, do you have any difficulty performing the following  activities: 02/19/2019  Hearing? N  Vision? N  Difficulty concentrating or making decisions? N  Walking or climbing stairs? N  Dressing or bathing? N  Doing errands, shopping? N  Preparing Food and eating ? N  Using the Toilet? N  In the past six months, have you accidently leaked urine? N  Do you have problems with loss of bowel control? N  Managing your Medications? N  Managing your Finances? N  Housekeeping or managing your Housekeeping? N  Some recent data might be hidden    Patient Care Team: [name], MD as PCP - General   Assessment:   This is a routine wellness examination for [name]. Physical assessment deferred to PCP.   Exercise Activities and Dietary recommendations Current Exercise Habits: The patient does not participate in regular exercise at present(stays active with home work projects)  Diet (meal preparation, eat out, water intake, caffeinated beverages, dairy products, fruits and vegetables): in general, a "healthy" diet  , well balanced   Reviewed heart healthy and diabetic diet. Encouraged patient to increase daily water and healthy fluid intake.  Goals    . Continue to be active as possible and enjoy  my family and life.    . Patient Stated     Maintain current health status, stay as healthy and as independent as possible. Enjoy life and family.       Fall Risk Fall Risk  02/19/2019 10/25/2018 01/29/2018 12/22/2017 12/19/2016  Falls in the past year? 0 0 No - No  Number falls in past yr: 0 0 - - -  Injury with Fall? - 0 - - -   Risk for fall due to : Impaired balance/gait;Impaired mobility - - Impaired mobility;Impaired balance/gait Impaired mobility;Impaired balance/gait  Follow up - Falls evaluation completed - - -    Depression Screen PHQ 2/9 Scores 02/19/2019 10/25/2018 12/22/2017 12/19/2016  PHQ - 2 Score 1 0 0 0  PHQ- 9 Score - - 1 0    Cognitive Function MMSE - Mini Mental State Exam 12/22/2017 12/19/2016  Orientation to time 5 5  Orientation to Place 5 5  Registration 3 3  Attention/ Calculation 5 4  Recall 1 2  Language- name 2 objects 2 2  Language- repeat 1 1  Language- follow 3 step command 3 3  Language- read & follow direction 1 1  Write a sentence 1 1  Copy design 1 1  Total score 28 28        Immunization History  Administered Date(s) Administered  . Influenza Split 05/24/2012  . Influenza Whole 08/30/2007, 05/12/2010  . Influenza, High Dose Seasonal PF 04/28/2015, 05/03/2016, 05/01/2018  . Influenza,inj,Quad PF,6+ Mos 06/03/2013, 05/20/2014, 06/30/2017  . Pneumococcal Conjugate-13 06/12/2014  . Pneumococcal Polysaccharide-23 08/29/2006, 04/01/2013  . Td 08/29/2002  . Tdap 04/01/2013  . Varicella 07/21/2008  . Zoster 07/16/2008  . Zoster Recombinat (Shingrix) 05/17/2018, 07/16/2018   Screening Tests Health Maintenance  Topic Date Due  . OPHTHALMOLOGY EXAM  01/12/2019  . INFLUENZA VACCINE  03/30/2019  . FOOT EXAM  05/02/2019  . TETANUS/TDAP  04/02/2023  . PNA vac Low Risk Adult  Completed      Plan:    Reviewed health maintenance screenings with patient today and relevant education, vaccines, and/or referrals were provided.   Continue to eat heart healthy diet (full of fruits, vegetables, whole grains, lean protein, water--limit salt, fat, and sugar intake) and increase physical activity as tolerated.  Continue doing brain stimulating activities (puzzles, reading, adult  coloring books, staying active) to keep memory sharp.   I have personally reviewed and noted the  following in the patient's chart:   . Medical and social history . Use of alcohol, tobacco or illicit drugs  . Current medications and supplements . Functional ability and status . Nutritional status . Physical activity . Advanced directives . List of other physicians . Vitals . Screenings to include cognitive, depression, and falls . Referrals and appointments  In addition, I have reviewed and discussed with patient certain preventive protocols, quality metrics, and best practice recommendations. A written personalized care plan for preventive services as well as general preventive health recommendations were provided to patient.     Michiel Cowboy, RN  02/19/2019  Medical screening examination/treatment/procedure(s) were performed by non-physician practitioner and as supervising physician I was immediately available for consultation/collaboration. I agree with above. Scarlette Calico, MD

## 2019-02-19 ENCOUNTER — Ambulatory Visit (INDEPENDENT_AMBULATORY_CARE_PROVIDER_SITE_OTHER): Payer: Medicare Other | Admitting: *Deleted

## 2019-02-19 DIAGNOSIS — Z0000 Encounter for general adult medical examination without abnormal findings: Secondary | ICD-10-CM | POA: Diagnosis not present

## 2019-02-19 NOTE — Patient Instructions (Addendum)
Continue doing brain stimulating activities (puzzles, reading, adult coloring books, staying active) to keep memory sharp.   Continue to eat heart healthy diet (full of fruits, vegetables, whole grains, lean protein, water--limit salt, fat, and sugar intake) and increase physical activity as tolerated.   Nicholas Burton , Thank you for taking time to come for your Medicare Wellness Visit. I appreciate your ongoing commitment to your health goals. Please review the following plan we discussed and let me know if I can assist you in the future.   These are the goals we discussed: Goals    . Continue to be active as possible and enjoy  my family and life.    . Patient Stated     Maintain current health status, stay as healthy and as independent as possible. Enjoy life and family.       This is a list of the screening recommended for you and due dates:  Health Maintenance  Topic Date Due  . Eye exam for diabetics  01/12/2019  . Flu Shot  03/30/2019  . Complete foot exam   05/02/2019  . Tetanus Vaccine  04/02/2023  . Pneumonia vaccines  Completed     Preventive Care 33 Years and Older, Male Preventive care refers to lifestyle choices and visits with your health care provider that can promote health and wellness. What does preventive care include?   A yearly physical exam. This is also called an annual well check.  Dental exams once or twice a year.  Routine eye exams. Ask your health care provider how often you should have your eyes checked.  Personal lifestyle choices, including: ? Daily care of your teeth and gums. ? Regular physical activity. ? Eating a healthy diet. ? Avoiding tobacco and drug use. ? Limiting alcohol use. ? Practicing safe sex. ? Taking low doses of aspirin every day. ? Taking vitamin and mineral supplements as recommended by your health care provider. What happens during an annual well check? The services and screenings done by your health care provider  during your annual well check will depend on your age, overall health, lifestyle risk factors, and family history of disease. Counseling Your health care provider may ask you questions about your:  Alcohol use.  Tobacco use.  Drug use.  Emotional well-being.  Home and relationship well-being.  Sexual activity.  Eating habits.  History of falls.  Memory and ability to understand (cognition).  Work and work Statistician. Screening You may have the following tests or measurements:  Height, weight, and BMI.  Blood pressure.  Lipid and cholesterol levels. These may be checked every 5 years, or more frequently if you are over 38 years old.  Skin check.  Lung cancer screening. You may have this screening every year starting at age 1 if you have a 30-pack-year history of smoking and currently smoke or have quit within the past 15 years.  Colorectal cancer screening. All adults should have this screening starting at age 54 and continuing until age 65. You will have tests every 1-10 years, depending on your results and the type of screening test. People at increased risk should start screening at an earlier age. Screening tests may include: ? Guaiac-based fecal occult blood testing. ? Fecal immunochemical test (FIT). ? Stool DNA test. ? Virtual colonoscopy. ? Sigmoidoscopy. During this test, a flexible tube with a tiny camera (sigmoidoscope) is used to examine your rectum and lower colon. The sigmoidoscope is inserted through your anus into your rectum and lower colon. ?  Colonoscopy. During this test, a long, thin, flexible tube with a tiny camera (colonoscope) is used to examine your entire colon and rectum.  Prostate cancer screening. Recommendations will vary depending on your family history and other risks.  Hepatitis C blood test.  Hepatitis B blood test.  Sexually transmitted disease (STD) testing.  Diabetes screening. This is done by checking your blood sugar  (glucose) after you have not eaten for a while (fasting). You may have this done every 1-3 years.  Abdominal aortic aneurysm (AAA) screening. You may need this if you are a current or former smoker.  Osteoporosis. You may be screened starting at age 45 if you are at high risk. Talk with your health care provider about your test results, treatment options, and if necessary, the need for more tests. Vaccines Your health care provider may recommend certain vaccines, such as:  Influenza vaccine. This is recommended every year.  Tetanus, diphtheria, and acellular pertussis (Tdap, Td) vaccine. You may need a Td booster every 10 years.  Varicella vaccine. You may need this if you have not been vaccinated.  Zoster vaccine. You may need this after age 47.  Measles, mumps, and rubella (MMR) vaccine. You may need at least one dose of MMR if you were born in 1957 or later. You may also need a second dose.  Pneumococcal 13-valent conjugate (PCV13) vaccine. One dose is recommended after age 41.  Pneumococcal polysaccharide (PPSV23) vaccine. One dose is recommended after age 67.  Meningococcal vaccine. You may need this if you have certain conditions.  Hepatitis A vaccine. You may need this if you have certain conditions or if you travel or work in places where you may be exposed to hepatitis A.  Hepatitis B vaccine. You may need this if you have certain conditions or if you travel or work in places where you may be exposed to hepatitis B.  Haemophilus influenzae type b (Hib) vaccine. You may need this if you have certain risk factors. Talk to your health care provider about which screenings and vaccines you need and how often you need them. This information is not intended to replace advice given to you by your health care provider. Make sure you discuss any questions you have with your health care provider. Document Released: 09/11/2015 Document Revised: 10/05/2017 Document Reviewed:  06/16/2015 Elsevier Interactive Patient Education  2019 [employer].

## 2019-02-26 ENCOUNTER — Other Ambulatory Visit: Payer: Self-pay | Admitting: Internal Medicine

## 2019-03-05 DIAGNOSIS — H353211 Exudative age-related macular degeneration, right eye, with active choroidal neovascularization: Secondary | ICD-10-CM | POA: Diagnosis not present

## 2019-03-05 DIAGNOSIS — H353223 Exudative age-related macular degeneration, left eye, with inactive scar: Secondary | ICD-10-CM | POA: Diagnosis not present

## 2019-03-05 DIAGNOSIS — H35371 Puckering of macula, right eye: Secondary | ICD-10-CM | POA: Diagnosis not present

## 2019-03-05 DIAGNOSIS — H43813 Vitreous degeneration, bilateral: Secondary | ICD-10-CM | POA: Diagnosis not present

## 2019-03-14 ENCOUNTER — Other Ambulatory Visit: Payer: Self-pay

## 2019-03-14 DIAGNOSIS — E118 Type 2 diabetes mellitus with unspecified complications: Secondary | ICD-10-CM

## 2019-04-01 ENCOUNTER — Other Ambulatory Visit: Payer: Self-pay

## 2019-04-01 ENCOUNTER — Ambulatory Visit (HOSPITAL_COMMUNITY): Payer: Medicare Other | Attending: Cardiology

## 2019-04-01 DIAGNOSIS — I5022 Chronic systolic (congestive) heart failure: Secondary | ICD-10-CM

## 2019-04-07 NOTE — Progress Notes (Signed)
Cardiology Office Note   Date:  04/08/2019   ID:  Nicholas Burton, DOB 25-Jun-1928, MRN [id number]  PCP:  [name], MD    No chief complaint on file.  Chronic systolic heart failure  Wt Readings from Last 3 Encounters:  04/08/19 199 lb (90.3 kg)  01/01/19 205 lb (93 kg)  12/20/18 205 lb (93 kg)       History of Present Illness: Nicholas Burton is a 85 y.o. 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  His daughter-in-law works in our office at [facility]  Several weeks ago, he was working around [employer] and had more [place]. No CP.   He had an echo showing EF 35%.  He was started on a diuretic.  He did well with a diuretic.  I then had a telephone visit with him and an ARB was started.  We discussed cardiac catheterization versus stress testing versus medical therapy.  Given his lack of symptoms, we opted for no testing.  The patient and his wife were nervous about coming into the office for any testing regardless.  Home health came out and did some blood work showing stable creatinine.  They showed a stable blood pressure and heart rate in the 90s.  The plan was then going to be to add a beta-blocker.  He did have some mild renal insufficiency which may interfere with uptitrating ARB.  Echo 03/2019:   1. The left ventricle has normal systolic function with an ejection fraction of 60-65%. The cavity size was normal. There is mildly increased left ventricular wall thickness. Left ventricular diastolic Doppler parameters are consistent with impaired  relaxation.  2. The right ventricle has normal systolic function. The cavity was normal.  3. The mitral valve is grossly normal.  4. The tricuspid valve is grossly normal.  5. The aortic valve is tricuspid. Mild thickening of the aortic valve. No stenosis of the aortic valve.  6. The aorta is abnormal in size and structure.  7. There is mild  dilatation of the ascending aorta measuring 41 mm.  8. Mild to moderate global reduction in LV systolic function; mild diastolic dysfunction; mildly dilated ascending aorta.  Since the last visit, he has done well.  Denies : Chest pain. Dizziness. Leg edema. Nitroglycerin use. Orthopnea. Palpitations. Paroxysmal nocturnal dyspnea. Shortness of breath. Syncope.   He works in the yard.  He gets tired.    Past Medical History:  Diagnosis Date  . Allergy   . ANEMIA ASSOCIATED W/OTHER [facility] NUTRITIONAL DEFIC 08/11/2010   Qualifier: Diagnosis of  By: Ronnald Ramp MD, [name].   . Cancer ([place])    skin  . Cataract   . Chronic combined systolic and diastolic congestive heart failure ([place]) 10/30/2018  . COPD with asthma ([place]) 03/02/2017  . CRI (chronic renal insufficiency), stage 3 (moderate) ([place]) 10/25/2018  . Deficiency anemia 10/25/2018  . Diabetes mellitus    type 2  . DOE (dyspnea on exertion) 09/03/2018  . Edema 06/11/2009   Qualifier: Diagnosis of  By: Ronnald Ramp MD, [name].   . History of skin cancer   . Hyperlipidemia with target LDL less than 100 11/10/2008       . Hypertension   . Memory loss 05/14/2009  . Obesity (BMI 30.0-34.9) 04/01/2013  . Osteoarthritis   . PSA elevation 10/25/2018  . SKIN CANCER, HX OF 11/10/2008   Qualifier: Diagnosis  of  By: Ronnald Ramp MD, [name].   . Snoring 04/03/2014  . Type II diabetes mellitus with manifestations ([place]) 11/10/2008   Estimated Creatinine Clearance: 38 mL/min (A) (by C-G formula based on SCr of 1.52 mg/dL (H)).   . Venous stasis dermatitis of both lower extremities 01/29/2018  . Vitamin D deficiency 11/10/2008    Past Surgical History:  Procedure Laterality Date  . CHOLECYSTECTOMY    . INGUINAL HERNIA REPAIR     x 3  . JOINT REPLACEMENT    . KNEE SURGERY     x 2  . MELANOMA EXCISION     x 3 -- Left arm  . TOTAL KNEE ARTHROPLASTY     x 3     Current Outpatient Medications  Medication Sig Dispense Refill  . aspirin EC 81 MG tablet Take 81 mg by  mouth daily.    Marland Kitchen atorvastatin (LIPITOR) 10 MG tablet TAKE 1 TABLET DAILY 90 tablet 1  . B-D ULTRAFINE III [place] PEN 31G X 8 MM MISC USE TO CHECK BLOOD SUGAR UP TO THREE TIMES A DAY 300 each 3  . Blood Glucose Monitoring Suppl (FREESTYLE FREEDOM LITE) W/DEVICE KIT Use to check blood sugars daily Dx E11.8 1 each 0  . carvedilol (COREG) 3.125 MG tablet Take 3.125 mg by mouth 2 (two) times daily with a meal.    . cholecalciferol (VITAMIN D) 1000 units tablet Take 1,000 Units by mouth daily.    . Colesevelam HCl 3.75 g PACK MIX THE CONTENTS OF 1 PACKET IN FLUID AND DRINK DAILY 90 each 1  . cyanocobalamin 1000 MCG tablet Take 1,000 mcg by mouth daily.    . Dietary Management Product (VASCULERA) TABS TAKE ONE TABLET BY MOUTH DAILY 30 tablet 11  . ferrous sulfate 325 (65 FE) MG tablet Take 1 tablet (325 mg total) by mouth 2 (two) times daily with a meal. 180 tablet 1  . glucose blood (FREESTYLE LITE) test strip Use as instructed to check blood sugar bid. DX: E11.9 200 each 3  . ibuprofen (ADVIL) 200 MG tablet Take 200 mg by mouth every 6 (six) hours as needed.    Marland Kitchen KOMBIGLYZE XR 2.12-998 MG TB24 TAKE 2 TABLETS DAILY 180 tablet 1  . Lancets (FREESTYLE) lancets Use to check blood sugars twice day Dx E11.8 180 each 3  . LANTUS SOLOSTAR 100 UNIT/ML Solostar Pen INJECT 45 UNITS UNDER THE SKIN DAILY (DUE TO FOLLOW UP IN MARCH) 45 mL 3  . losartan (COZAAR) 25 MG tablet Take 1 tablet (25 mg total) by mouth daily. 90 tablet 3  . metoprolol tartrate (LOPRESSOR) 25 MG tablet Take 0.5 tablets (12.5 mg total) by mouth 2 (two) times daily. 30 tablet 11  . Multiple Vitamins-Minerals (PRESERVISION/LUTEIN) CAPS Take 1 capsule by mouth 2 (two) times daily.      Marland Kitchen torsemide (DEMADEX) 10 MG tablet TAKE 1 TABLET DAILY 90 tablet 1   No current facility-administered medications for this visit.     Allergies:   Ace inhibitors, Oxycodone-acetaminophen, and Penicillins    Social History:  The patient  reports that he has  never smoked. He has never used smokeless tobacco. He reports that he does not drink alcohol or use drugs.   Family History:  The patient's family history includes Cancer in his brother and sister; Diabetes in his mother, son, and another family member; Heart disease in his father and mother.    ROS:  Please see the history of present illness.   Otherwise,  review of systems are positive for not likng wearing a mask.   All other systems are reviewed and negative.    PHYSICAL EXAM: VS:  BP 122/72   Pulse 77   Ht '5\' 9"'$  (1.753 m)   Wt 199 lb (90.3 kg)   SpO2 97%   BMI 29.39 kg/m  , BMI Body mass index is 29.39 kg/m. GEN: Well nourished, well developed, in no acute distress  HEENT: normal  Neck: no JVD, carotid bruits, or masses Cardiac: RRR; 1/6 systolic murmur, no rubs, or gallops,tr ankle edema  Respiratory:  clear to auscultation bilaterally, normal work of breathing GI: soft, nontender, nondistended, + BS MS: no deformity or atrophy  Skin: warm and dry, no rash Neuro:  Strength and sensation are intact Psych: euthymic mood, full affect   EKG:   The ekg ordered today demonstrates    Recent Labs: 09/03/2018: Pro B Natriuretic peptide (BNP) 29.0 10/25/2018: ALT 13; Hemoglobin 13.0; Platelets 251.0; TSH 3.85 12/26/2018: BUN 22; Creatinine, Ser 1.33; Potassium 4.7; Sodium 145   Lipid Panel    Component Value Date/Time   CHOL 87 10/25/2018 1138   TRIG 120.0 10/25/2018 1138   HDL 32.40 (L) 10/25/2018 1138   CHOLHDL 3 10/25/2018 1138   VLDL 24.0 10/25/2018 1138   LDLCALC 30 10/25/2018 1138     Other studies Reviewed: Additional studies/ records that were reviewed today with results demonstrating: echo reviewed.   ASSESSMENT AND PLAN:  1. Cardiomyopathy: Improved by recent echo last week. Back to normal. Continue current meds. BP controlled and meds are well tolerated.  2. Chronic renal insufficiency: Stable in 11/2018.  Continue ARB.  3. Diabetes: Managed by PMD. A1C 6.4  4. HTN: The current medical regimen is effective;  continue present plan and medications.    Current medicines are reviewed at length with the patient today.  The patient concerns regarding his medicines were addressed.  The following changes have been made:  No change  Labs/ tests ordered today include:  No orders of the defined types were placed in this encounter.   Recommend 150 minutes/week of aerobic exercise Low fat, low carb, high fiber diet recommended  Disposition:   FU in 1 year   Signed, Larae Grooms, MD  04/08/2019 4:03 PM    [facility] [place], [place], [place]  [address] Phone: [phone]; Fax: [phone]

## 2019-04-08 ENCOUNTER — Encounter: Payer: Self-pay | Admitting: Interventional Cardiology

## 2019-04-08 ENCOUNTER — Other Ambulatory Visit: Payer: Self-pay

## 2019-04-08 ENCOUNTER — Ambulatory Visit (INDEPENDENT_AMBULATORY_CARE_PROVIDER_SITE_OTHER): Payer: Medicare Other | Admitting: Interventional Cardiology

## 2019-04-08 VITALS — BP 122/72 | HR 77 | Ht 69.0 in | Wt 199.0 lb

## 2019-04-08 DIAGNOSIS — I5042 Chronic combined systolic (congestive) and diastolic (congestive) heart failure: Secondary | ICD-10-CM

## 2019-04-08 DIAGNOSIS — N183 Chronic kidney disease, stage 3 unspecified: Secondary | ICD-10-CM

## 2019-04-08 DIAGNOSIS — E118 Type 2 diabetes mellitus with unspecified complications: Secondary | ICD-10-CM

## 2019-04-08 DIAGNOSIS — I10 Essential (primary) hypertension: Secondary | ICD-10-CM

## 2019-04-08 MED ORDER — LOSARTAN POTASSIUM 25 MG PO TABS: 25.0000 mg | ORAL_TABLET | Freq: Every day | ORAL | 3 refills | Status: DC

## 2019-04-08 MED ORDER — METOPROLOL TARTRATE 25 MG PO TABS: 12.5000 mg | ORAL_TABLET | Freq: Two times a day (BID) | ORAL | 3 refills | Status: DC

## 2019-04-08 NOTE — Patient Instructions (Signed)

## 2019-04-30 DIAGNOSIS — H35371 Puckering of macula, right eye: Secondary | ICD-10-CM | POA: Diagnosis not present

## 2019-04-30 DIAGNOSIS — H353223 Exudative age-related macular degeneration, left eye, with inactive scar: Secondary | ICD-10-CM | POA: Diagnosis not present

## 2019-04-30 DIAGNOSIS — H353211 Exudative age-related macular degeneration, right eye, with active choroidal neovascularization: Secondary | ICD-10-CM | POA: Diagnosis not present

## 2019-04-30 DIAGNOSIS — H3540 Unspecified peripheral retinal degeneration: Secondary | ICD-10-CM | POA: Diagnosis not present

## 2019-05-15 ENCOUNTER — Other Ambulatory Visit: Payer: Self-pay | Admitting: Internal Medicine

## 2019-05-15 DIAGNOSIS — I10 Essential (primary) hypertension: Secondary | ICD-10-CM

## 2019-05-16 ENCOUNTER — Ambulatory Visit (INDEPENDENT_AMBULATORY_CARE_PROVIDER_SITE_OTHER)
Admission: RE | Admit: 2019-05-16 | Discharge: 2019-05-16 | Disposition: A | Discharge status: Home / Self Care | Payer: Medicare Other | Source: Ambulatory Visit | Attending: Internal Medicine | Admitting: Internal Medicine

## 2019-05-16 ENCOUNTER — Other Ambulatory Visit: Payer: Self-pay

## 2019-05-16 ENCOUNTER — Encounter: Payer: Self-pay | Admitting: Internal Medicine

## 2019-05-16 ENCOUNTER — Ambulatory Visit (INDEPENDENT_AMBULATORY_CARE_PROVIDER_SITE_OTHER): Payer: Medicare Other | Admitting: Internal Medicine

## 2019-05-16 VITALS — BP 130/70 | HR 64 | Temp 99.2°F | Ht 69.0 in | Wt 200.0 lb

## 2019-05-16 DIAGNOSIS — R059 Cough, unspecified: Secondary | ICD-10-CM

## 2019-05-16 DIAGNOSIS — Z23 Encounter for immunization: Secondary | ICD-10-CM

## 2019-05-16 DIAGNOSIS — N183 Chronic kidney disease, stage 3 unspecified: Secondary | ICD-10-CM

## 2019-05-16 DIAGNOSIS — I10 Essential (primary) hypertension: Secondary | ICD-10-CM

## 2019-05-16 DIAGNOSIS — E11649 Type 2 diabetes mellitus with hypoglycemia without coma: Secondary | ICD-10-CM | POA: Diagnosis not present

## 2019-05-16 DIAGNOSIS — J988 Other specified respiratory disorders: Secondary | ICD-10-CM | POA: Diagnosis not present

## 2019-05-16 DIAGNOSIS — R05 Cough: Secondary | ICD-10-CM

## 2019-05-16 DIAGNOSIS — E118 Type 2 diabetes mellitus with unspecified complications: Secondary | ICD-10-CM

## 2019-05-16 DIAGNOSIS — J4541 Moderate persistent asthma with (acute) exacerbation: Secondary | ICD-10-CM | POA: Diagnosis not present

## 2019-05-16 DIAGNOSIS — E538 Deficiency of other specified B group vitamins: Secondary | ICD-10-CM

## 2019-05-16 MED ORDER — METHYLPREDNISOLONE ACETATE 80 MG/ML IJ SUSP
120.0000 mg | Freq: Once | INTRAMUSCULAR | Status: AC
  Administered 2019-05-16: 15:00:00 120 mg via INTRAMUSCULAR

## 2019-05-16 MED ORDER — ALBUTEROL SULFATE (2.5 MG/3ML) 0.083% IN NEBU: INHALATION_SOLUTION | RESPIRATORY_TRACT | 1 refills | Status: DC

## 2019-05-16 MED ORDER — ALBUTEROL SULFATE (2.5 MG/3ML) 0.083% IN NEBU: 2.5000 mg | INHALATION_SOLUTION | Freq: Four times a day (QID) | RESPIRATORY_TRACT | 1 refills | Status: DC | PRN

## 2019-05-16 MED ORDER — AZITHROMYCIN 500 MG PO TABS: 500.0000 mg | ORAL_TABLET | Freq: Every day | ORAL | 0 refills | Status: AC

## 2019-05-16 MED ORDER — BUDESONIDE 0.5 MG/2ML IN SUSP: 0.5000 mg | Freq: Two times a day (BID) | RESPIRATORY_TRACT | 1 refills | Status: DC

## 2019-05-16 MED ORDER — LANTUS SOLOSTAR 100 UNIT/ML ~~LOC~~ SOPN: 30.0000 [IU] | PEN_INJECTOR | Freq: Every day | SUBCUTANEOUS | 3 refills | Status: DC

## 2019-05-16 NOTE — Progress Notes (Signed)
Subjective:  Patient ID: Nicholas Burton, male    DOB: Jan 26, 1928  Age: 85 y.o. MRN: [id number]  CC: Hypertension, Diabetes, and Cough   HPI [name] presents for f/up - He complains of an 8-day history of cough that is productive of thick yellow phlegm with sore throat, shortness of breath, and wheezing.  He denies fever, chills, night sweats, chest pain, or hemoptysis.  He tells me that a week ago he was awakened at 4 in the morning with a blood sugar into the 50s.  He has had a few episodes of hypoglycemia like this over the last few months.  Outpatient Medications Prior to Visit  Medication Sig Dispense Refill  . aspirin EC 81 MG tablet Take 81 mg by mouth daily.    Marland Kitchen atorvastatin (LIPITOR) 10 MG tablet TAKE 1 TABLET DAILY 90 tablet 1  . B-D ULTRAFINE III [place] PEN 31G X 8 MM MISC USE TO CHECK BLOOD SUGAR UP TO THREE TIMES A DAY 300 each 3  . Blood Glucose Monitoring Suppl (FREESTYLE FREEDOM LITE) W/DEVICE KIT Use to check blood sugars daily Dx E11.8 1 each 0  . cholecalciferol (VITAMIN D) 1000 units tablet Take 1,000 Units by mouth daily.    . Colesevelam HCl 3.75 g PACK MIX THE CONTENTS OF 1 PACKET IN FLUID AND DRINK DAILY 90 each 1  . cyanocobalamin 1000 MCG tablet Take 1,000 mcg by mouth daily.    . Dietary Management Product (VASCULERA) TABS TAKE ONE TABLET BY MOUTH DAILY 30 tablet 11  . ferrous sulfate 325 (65 FE) MG tablet Take 1 tablet (325 mg total) by mouth 2 (two) times daily with a meal. 180 tablet 1  . glucose blood (FREESTYLE LITE) test strip Use as instructed to check blood sugar bid. DX: E11.9 200 each 3  . ibuprofen (ADVIL) 200 MG tablet Take 200 mg by mouth every 6 (six) hours as needed.    Marland Kitchen KOMBIGLYZE XR 2.12-998 MG TB24 TAKE 2 TABLETS DAILY 180 tablet 1  . Lancets (FREESTYLE) lancets Use to check blood sugars twice day Dx E11.8 180 each 3  . losartan (COZAAR) 25 MG tablet Take 1 tablet (25 mg total) by mouth daily. 90 tablet 3  . metoprolol tartrate  (LOPRESSOR) 25 MG tablet Take 0.5 tablets (12.5 mg total) by mouth 2 (two) times daily. 90 tablet 3  . Multiple Vitamins-Minerals (PRESERVISION/LUTEIN) CAPS Take 1 capsule by mouth 2 (two) times daily.      Marland Kitchen torsemide (DEMADEX) 10 MG tablet TAKE 1 TABLET DAILY 90 tablet 1  . LANTUS SOLOSTAR 100 UNIT/ML Solostar Pen INJECT 45 UNITS UNDER THE SKIN DAILY (DUE TO FOLLOW UP IN MARCH) 45 mL 3   No facility-administered medications prior to visit.     ROS Review of Systems  Constitutional: Negative for chills, diaphoresis, fatigue and fever.  HENT: Positive for sore throat. Negative for sinus pressure and trouble swallowing.   Respiratory: Positive for cough, shortness of breath and wheezing.   Cardiovascular: Negative for chest pain, palpitations and leg swelling.  Gastrointestinal: Negative for abdominal pain, diarrhea, nausea and vomiting.  Genitourinary: Negative.  Negative for difficulty urinating and hematuria.  Musculoskeletal: Negative.   Skin: Negative.   Neurological: Negative for dizziness, weakness and light-headedness.  Hematological: Negative for adenopathy. Does not bruise/bleed easily.  Psychiatric/Behavioral: Negative.     Objective:  BP 130/70 (BP Location: Left Arm, Patient Position: Sitting, Cuff Size: Large)   Pulse 64   Temp 99.2 F (37.3 C) (  Oral)   Ht '5\' 9"'$  (1.753 m)   Wt 200 lb (90.7 kg)   SpO2 97%   BMI 29.53 kg/m   BP Readings from Last 3 Encounters:  05/16/19 130/70  04/08/19 122/72  01/01/19 (!) 128/93    Wt Readings from Last 3 Encounters:  05/16/19 200 lb (90.7 kg)  04/08/19 199 lb (90.3 kg)  01/01/19 205 lb (93 kg)    Physical Exam Vitals signs reviewed.  Constitutional:      General: He is not in acute distress.    Appearance: He is not ill-appearing, toxic-appearing or diaphoretic.  HENT:     Nose: Nose normal.     Mouth/Throat:     Mouth: Mucous membranes are moist.  Eyes:     Conjunctiva/sclera: Conjunctivae normal.  Neck:      Musculoskeletal: Normal range of motion. No muscular tenderness.  Cardiovascular:     Rate and Rhythm: Normal rate and regular rhythm.     Heart sounds: No murmur.  Pulmonary:     Effort: Pulmonary effort is normal. No respiratory distress.     Breath sounds: Normal air entry. No stridor. Examination of the right-middle field reveals rhonchi. Examination of the left-middle field reveals rhonchi. Examination of the right-lower field reveals rhonchi. Examination of the left-lower field reveals wheezing and rhonchi. Wheezing and rhonchi present. No decreased breath sounds or rales.  Abdominal:     General: Abdomen is protuberant. Bowel sounds are normal. There is no distension.     Palpations: There is no hepatomegaly or splenomegaly.     Tenderness: There is no abdominal tenderness.  Musculoskeletal: Normal range of motion.     Right lower leg: Edema (trace) present.     Left lower leg: Edema (trace) present.  Lymphadenopathy:     Cervical: No cervical adenopathy.  Neurological:     General: No focal deficit present.     Mental Status: He is alert.  Psychiatric:        Mood and Affect: Mood normal.     Lab Results  Component Value Date   WBC 10.0 10/25/2018   HGB 13.0 10/25/2018   HCT 38.9 (L) 10/25/2018   PLT 251.0 10/25/2018   GLUCOSE 79 12/26/2018   CHOL 87 10/25/2018   TRIG 120.0 10/25/2018   HDL 32.40 (L) 10/25/2018   LDLCALC 30 10/25/2018   ALT 13 10/25/2018   AST 18 10/25/2018   NA 145 (H) 12/26/2018   K 4.7 12/26/2018   CL 106 12/26/2018   CREATININE 1.33 (H) 12/26/2018   BUN 22 12/26/2018   CO2 22 12/26/2018   TSH 3.85 10/25/2018   PSA 11.21 (H) 10/30/2017   INR 1.3 ratio (H) 08/11/2010   HGBA1C 6.4 10/25/2018   MICROALBUR 0.8 10/25/2018    Dg Chest 2 View  Result Date: 02/05/2016 CLINICAL DATA:  Melanoma. EXAM: CHEST  2 VIEW COMPARISON:  01/29/2015. FINDINGS: The cardiac silhouette, mediastinal and hilar contours are within normal limits for age and stable.  There is mild tortuosity of the thoracic aorta. Stable basilar scarring changes. No infiltrates, edema or effusions. No worrisome pulmonary lesions. The bony thorax is intact. IMPRESSION: No acute cardiopulmonary findings no worrisome pulmonary lesions. Stable basilar scarring changes. Electronically Signed   By: Marijo Sanes M.D.   On: 02/05/2016 14:08    Dg Chest 2 View  Result Date: 05/16/2019 CLINICAL DATA:  Cough and wheezing for 2 days. EXAM: CHEST - 2 VIEW COMPARISON:  02/05/2016. FINDINGS: Cardiac silhouette is normal in size.  No mediastinal or hilar masses. No evidence of adenopathy. Clear lungs.  No pleural effusion or pneumothorax. Skeletal structures are intact. IMPRESSION: No active cardiopulmonary disease. Electronically Signed   By: Lajean Manes M.D.   On: 05/16/2019 14:38    Assessment & Plan:   Jamori was seen today for hypertension, diabetes and cough.  Diagnoses and all orders for this visit:  B12 deficiency- I will monitor his CBC and his folate level. -     CBC with Differential/Platelet; Future -     Folate; Future  Essential hypertension- His blood pressure is adequately well controlled.  I will monitor his electrolytes and renal function. -     Basic metabolic panel; Future  Type II diabetes mellitus with manifestations ([place])- He has had some nocturnal hypoglycemic episodes so I asked him to decrease his basal insulin dose from 45 units a day to 30 units a day.  He will continue the other glycemic agents with no changes. -     Hemoglobin A1c; Future -     Insulin Glargine (LANTUS SOLOSTAR) 100 UNIT/ML Solostar Pen; Inject 30 Units into the skin daily.  CRI (chronic renal insufficiency), stage 3 (moderate) ([facility])-I will monitor his renal function. -     Basic metabolic panel; Future  Need for influenza vaccination -     Flu Vaccine QUAD High Dose(Fluad)  Cough-his chest x-ray is negative for mass or infiltrate.  I will treat him for asthma and respiratory tract  infection. -     DG Chest 2 View; Future  Moderate persistent asthma with acute exacerbation -     budesonide (PULMICORT) 0.5 MG/2ML nebulizer solution; Take 2 mLs (0.5 mg total) by nebulization 2 (two) times daily. -     Discontinue: albuterol (PROVENTIL) (2.5 MG/3ML) 0.083% nebulizer solution; Take 3 mLs (2.5 mg total) by nebulization every 6 (six) hours as needed for wheezing or shortness of breath. -     methylPREDNISolone acetate (DEPO-MEDROL) injection 120 mg -     albuterol (PROVENTIL) (2.5 MG/3ML) 0.083% nebulizer solution; Take 3 mLs by nebulization every 6 hours and as needed for wheezing or shortness of breath.  RTI (respiratory tract infection) -     azithromycin (ZITHROMAX) 500 MG tablet; Take 1 tablet (500 mg total) by mouth daily for 3 days.   I have changed [name]. Ha's Lantus SoloStar and albuterol. I am also having him start on budesonide and azithromycin. Additionally, I am having him maintain his PreserVision/Lutein, FreeStyle Freedom Lite, freestyle, cyanocobalamin, cholecalciferol, B-D ULTRAFINE III [place] PEN, ferrous sulfate, glucose blood, atorvastatin, Kombiglyze XR, Vasculera, ibuprofen, aspirin EC, losartan, metoprolol tartrate, torsemide, and Colesevelam HCl. We administered methylPREDNISolone acetate.  Meds ordered this encounter  Medications  . budesonide (PULMICORT) 0.5 MG/2ML nebulizer solution    Sig: Take 2 mLs (0.5 mg total) by nebulization 2 (two) times daily.    Dispense:  360 mL    Refill:  1  . DISCONTD: albuterol (PROVENTIL) (2.5 MG/3ML) 0.083% nebulizer solution    Sig: Take 3 mLs (2.5 mg total) by nebulization every 6 (six) hours as needed for wheezing or shortness of breath.    Dispense:  300 mL    Refill:  1  . Insulin Glargine (LANTUS SOLOSTAR) 100 UNIT/ML Solostar Pen    Sig: Inject 30 Units into the skin daily.    Dispense:  45 mL    Refill:  3  . methylPREDNISolone acetate (DEPO-MEDROL) injection 120 mg  . azithromycin (ZITHROMAX) 500  MG tablet  Sig: Take 1 tablet (500 mg total) by mouth daily for 3 days.    Dispense:  3 tablet    Refill:  0  . albuterol (PROVENTIL) (2.5 MG/3ML) 0.083% nebulizer solution    Sig: Take 3 mLs by nebulization every 6 hours and as needed for wheezing or shortness of breath.    Dispense:  300 mL    Refill:  1     Follow-up: Return in about 3 weeks (around 06/06/2019).  Scarlette Calico, MD

## 2019-05-16 NOTE — Patient Instructions (Signed)

## 2019-05-17 ENCOUNTER — Other Ambulatory Visit (INDEPENDENT_AMBULATORY_CARE_PROVIDER_SITE_OTHER): Payer: Medicare Other

## 2019-05-17 DIAGNOSIS — I10 Essential (primary) hypertension: Secondary | ICD-10-CM | POA: Diagnosis not present

## 2019-05-17 DIAGNOSIS — E538 Deficiency of other specified B group vitamins: Secondary | ICD-10-CM | POA: Diagnosis not present

## 2019-05-17 DIAGNOSIS — E118 Type 2 diabetes mellitus with unspecified complications: Secondary | ICD-10-CM | POA: Diagnosis not present

## 2019-05-17 DIAGNOSIS — N183 Chronic kidney disease, stage 3 unspecified: Secondary | ICD-10-CM

## 2019-05-17 LAB — CBC WITH DIFFERENTIAL/PLATELET
Basophils Absolute: 0.1 10*3/uL (ref 0.0–0.1)
Basophils Relative: 0.4 % (ref 0.0–3.0)
Eosinophils Absolute: 0.3 10*3/uL (ref 0.0–0.7)
Eosinophils Relative: 2.5 % (ref 0.0–5.0)
HCT: 38.8 % — ABNORMAL LOW (ref 39.0–52.0)
Hemoglobin: 12.7 g/dL — ABNORMAL LOW (ref 13.0–17.0)
Lymphocytes Relative: 28.2 % (ref 12.0–46.0)
Lymphs Abs: 3.7 10*3/uL (ref 0.7–4.0)
MCHC: 32.9 g/dL (ref 30.0–36.0)
MCV: 88 fl (ref 78.0–100.0)
Monocytes Absolute: 1.2 10*3/uL — ABNORMAL HIGH (ref 0.1–1.0)
Monocytes Relative: 9 % (ref 3.0–12.0)
Neutro Abs: 7.8 10*3/uL — ABNORMAL HIGH (ref 1.4–7.7)
Neutrophils Relative %: 59.9 % (ref 43.0–77.0)
Platelets: 246 10*3/uL (ref 150.0–400.0)
RBC: 4.41 Mil/uL (ref 4.22–5.81)
RDW: 14.1 % (ref 11.5–15.5)
WBC: 13 10*3/uL — ABNORMAL HIGH (ref 4.0–10.5)

## 2019-05-17 LAB — BASIC METABOLIC PANEL
BUN: 20 mg/dL (ref 6–23)
CO2: 29 mEq/L (ref 19–32)
Calcium: 8.6 mg/dL (ref 8.4–10.5)
Chloride: 106 mEq/L (ref 96–112)
Creatinine, Ser: 1.31 mg/dL (ref 0.40–1.50)
GFR: 51.48 mL/min — ABNORMAL LOW (ref >60.00)
Glucose, Bld: 62 mg/dL — ABNORMAL LOW (ref 70–99)
Potassium: 3.6 mEq/L (ref 3.5–5.1)
Sodium: 142 mEq/L (ref 135–145)

## 2019-05-17 LAB — HEMOGLOBIN A1C: Hgb A1c MFr Bld: 6.5 % (ref 4.6–6.5)

## 2019-05-17 LAB — FOLATE: Folate: 10.7 ng/mL (ref >5.9)

## 2019-05-27 DIAGNOSIS — E109 Type 1 diabetes mellitus without complications: Secondary | ICD-10-CM | POA: Diagnosis not present

## 2019-05-27 DIAGNOSIS — H5203 Hypermetropia, bilateral: Secondary | ICD-10-CM | POA: Diagnosis not present

## 2019-05-27 DIAGNOSIS — H524 Presbyopia: Secondary | ICD-10-CM | POA: Diagnosis not present

## 2019-05-27 DIAGNOSIS — H3530 Unspecified macular degeneration: Secondary | ICD-10-CM | POA: Diagnosis not present

## 2019-05-27 DIAGNOSIS — Z961 Presence of intraocular lens: Secondary | ICD-10-CM | POA: Diagnosis not present

## 2019-05-27 DIAGNOSIS — H52223 Regular astigmatism, bilateral: Secondary | ICD-10-CM | POA: Diagnosis not present

## 2019-06-06 ENCOUNTER — Other Ambulatory Visit (INDEPENDENT_AMBULATORY_CARE_PROVIDER_SITE_OTHER): Payer: Medicare Other

## 2019-06-06 ENCOUNTER — Ambulatory Visit (INDEPENDENT_AMBULATORY_CARE_PROVIDER_SITE_OTHER): Payer: Medicare Other | Admitting: Internal Medicine

## 2019-06-06 ENCOUNTER — Encounter: Payer: Self-pay | Admitting: Internal Medicine

## 2019-06-06 ENCOUNTER — Other Ambulatory Visit: Payer: Self-pay

## 2019-06-06 VITALS — BP 148/80 | HR 64 | Temp 98.4°F | Ht 69.0 in | Wt 199.5 lb

## 2019-06-06 DIAGNOSIS — H60393 Other infective otitis externa, bilateral: Secondary | ICD-10-CM

## 2019-06-06 DIAGNOSIS — D508 Other iron deficiency anemias: Secondary | ICD-10-CM

## 2019-06-06 DIAGNOSIS — E559 Vitamin D deficiency, unspecified: Secondary | ICD-10-CM | POA: Diagnosis not present

## 2019-06-06 DIAGNOSIS — H6090 Unspecified otitis externa, unspecified ear: Secondary | ICD-10-CM | POA: Insufficient documentation

## 2019-06-06 DIAGNOSIS — H60331 Swimmer's ear, right ear: Secondary | ICD-10-CM | POA: Insufficient documentation

## 2019-06-06 DIAGNOSIS — H6091 Unspecified otitis externa, right ear: Secondary | ICD-10-CM | POA: Insufficient documentation

## 2019-06-06 LAB — CBC WITH DIFFERENTIAL/PLATELET
Basophils Absolute: 0 10*3/uL (ref 0.0–0.1)
Basophils Relative: 0.4 % (ref 0.0–3.0)
Eosinophils Absolute: 0.4 10*3/uL (ref 0.0–0.7)
Eosinophils Relative: 3.7 % (ref 0.0–5.0)
HCT: 37 % — ABNORMAL LOW (ref 39.0–52.0)
Hemoglobin: 12.2 g/dL — ABNORMAL LOW (ref 13.0–17.0)
Lymphocytes Relative: 30 % (ref 12.0–46.0)
Lymphs Abs: 3.1 10*3/uL (ref 0.7–4.0)
MCHC: 33.1 g/dL (ref 30.0–36.0)
MCV: 88.8 fl (ref 78.0–100.0)
Monocytes Absolute: 1 10*3/uL (ref 0.1–1.0)
Monocytes Relative: 9.4 % (ref 3.0–12.0)
Neutro Abs: 5.9 10*3/uL (ref 1.4–7.7)
Neutrophils Relative %: 56.5 % (ref 43.0–77.0)
Platelets: 228 10*3/uL (ref 150.0–400.0)
RBC: 4.17 Mil/uL — ABNORMAL LOW (ref 4.22–5.81)
RDW: 14.5 % (ref 11.5–15.5)
WBC: 10.4 10*3/uL (ref 4.0–10.5)

## 2019-06-06 LAB — IBC PANEL
Iron: 79 ug/dL (ref 42–165)
Saturation Ratios: 26.9 % (ref 20.0–50.0)
Transferrin: 210 mg/dL — ABNORMAL LOW (ref 212.0–360.0)

## 2019-06-06 LAB — FERRITIN: Ferritin: 57.5 ng/mL (ref 22.0–322.0)

## 2019-06-06 MED ORDER — CIPRO HC 0.2-1 % OT SUSP: 3.0000 [drp] | Freq: Two times a day (BID) | OTIC | 1 refills | Status: DC

## 2019-06-06 NOTE — Progress Notes (Signed)
Subjective:  Patient ID: Nicholas Burton, male    DOB: 1927-11-11  Age: 85 y.o. MRN: [id number]  CC: Anemia   HPI LARON BOORMAN presents for f/up - He has had no more episodes of hypoglycemia.  He is using the nebulized ICS and has had no recent episodes of cough, shortness of breath, or wheezing.  He is no longer taking an iron supplement.  He is not aware of any sources of blood loss.  He recently saw an audiologist at the [place] and was told that he had abnormal findings in the external auditory canals.  He has had some ear discomfort recently, worse on the left than the right.  Outpatient Medications Prior to Visit  Medication Sig Dispense Refill  . albuterol (PROVENTIL) (2.5 MG/3ML) 0.083% nebulizer solution Take 3 mLs by nebulization every 6 hours and as needed for wheezing or shortness of breath. 300 mL 1  . aspirin EC 81 MG tablet Take 81 mg by mouth daily.    Marland Kitchen atorvastatin (LIPITOR) 10 MG tablet TAKE 1 TABLET DAILY 90 tablet 1  . B-D ULTRAFINE III [place] PEN 31G X 8 MM MISC USE TO CHECK BLOOD SUGAR UP TO THREE TIMES A DAY 300 each 3  . Blood Glucose Monitoring Suppl (FREESTYLE FREEDOM LITE) W/DEVICE KIT Use to check blood sugars daily Dx E11.8 1 each 0  . budesonide (PULMICORT) 0.5 MG/2ML nebulizer solution Take 2 mLs (0.5 mg total) by nebulization 2 (two) times daily. 360 mL 1  . cholecalciferol (VITAMIN D) 1000 units tablet Take 1,000 Units by mouth daily.    . Colesevelam HCl 3.75 g PACK MIX THE CONTENTS OF 1 PACKET IN FLUID AND DRINK DAILY 90 each 1  . cyanocobalamin 1000 MCG tablet Take 1,000 mcg by mouth daily.    . Dietary Management Product (VASCULERA) TABS TAKE ONE TABLET BY MOUTH DAILY 30 tablet 11  . ferrous sulfate 325 (65 FE) MG tablet Take 1 tablet (325 mg total) by mouth 2 (two) times daily with a meal. 180 tablet 1  . glucose blood (FREESTYLE LITE) test strip Use as instructed to check blood sugar bid. DX: E11.9 200 each 3  . ibuprofen (ADVIL) 200 MG tablet Take 200 mg by  mouth every 6 (six) hours as needed.    . Insulin Glargine (LANTUS SOLOSTAR) 100 UNIT/ML Solostar Pen Inject 30 Units into the skin daily. 45 mL 3  . KOMBIGLYZE XR 2.12-998 MG TB24 TAKE 2 TABLETS DAILY 180 tablet 1  . Lancets (FREESTYLE) lancets Use to check blood sugars twice day Dx E11.8 180 each 3  . losartan (COZAAR) 25 MG tablet Take 1 tablet (25 mg total) by mouth daily. 90 tablet 3  . metoprolol tartrate (LOPRESSOR) 25 MG tablet Take 0.5 tablets (12.5 mg total) by mouth 2 (two) times daily. 90 tablet 3  . Multiple Vitamins-Minerals (PRESERVISION/LUTEIN) CAPS Take 1 capsule by mouth 2 (two) times daily.      Marland Kitchen torsemide (DEMADEX) 10 MG tablet TAKE 1 TABLET DAILY 90 tablet 1   No facility-administered medications prior to visit.     ROS Review of Systems  Constitutional: Negative for diaphoresis, fatigue and unexpected weight change.  HENT: Positive for ear pain. Negative for ear discharge and sore throat.   Eyes: Negative for visual disturbance.  Respiratory: Negative for cough, shortness of breath and wheezing.   Cardiovascular: Negative for chest pain, palpitations and leg swelling.  Gastrointestinal: Negative for abdominal pain, blood in stool, constipation, diarrhea, nausea and  vomiting.  Endocrine: Negative.   Genitourinary: Negative.  Negative for difficulty urinating and hematuria.  Musculoskeletal: Negative for arthralgias and myalgias.  Skin: Negative.  Negative for color change and pallor.  Neurological: Negative.  Negative for dizziness and weakness.  Hematological: Negative for adenopathy. Does not bruise/bleed easily.  Psychiatric/Behavioral: Negative.     Objective:  BP (!) 148/80 (BP Location: Left Arm, Patient Position: Sitting, Cuff Size: Normal)   Pulse 64   Temp 98.4 F (36.9 C) (Oral)   Ht _0  (1.753 m)   Wt 199 lb 8 oz (90.5 kg)   SpO2 99%   BMI 29.46 kg/m   BP Readings from Last 3 Encounters:  06/06/19 (!) 148/80  05/16/19 130/70  04/08/19  122/72    Wt Readings from Last 3 Encounters:  06/06/19 199 lb 8 oz (90.5 kg)  05/16/19 200 lb (90.7 kg)  04/08/19 199 lb (90.3 kg)    Physical Exam Vitals signs reviewed.  Constitutional:      Appearance: Normal appearance.  HENT:     Right Ear: Decreased hearing noted. No drainage, swelling or tenderness. No middle ear effusion. There is no impacted cerumen. Tympanic membrane is not injected.     Left Ear: Decreased hearing noted. No drainage, swelling or tenderness.  No middle ear effusion. There is no impacted cerumen. Tympanic membrane is not injected.     Ears:     Comments: Left posterior EAC shows erythema, swelling, and a scant amount of blood.  Right EAC posteriorly shows a scant amount of blood but no swelling or exudate.    Nose: Nose normal.     Mouth/Throat:     Mouth: Mucous membranes are moist.     Pharynx: No oropharyngeal exudate.  Eyes:     General: No scleral icterus.    Conjunctiva/sclera: Conjunctivae normal.  Neck:     Musculoskeletal: Normal range of motion.  Cardiovascular:     Rate and Rhythm: Normal rate and regular rhythm.     Heart sounds: No murmur.  Pulmonary:     Effort: Pulmonary effort is normal.     Breath sounds: No wheezing, rhonchi or rales.  Abdominal:     General: Abdomen is protuberant. Bowel sounds are normal. There is no distension.     Palpations: There is no hepatomegaly or splenomegaly.  Musculoskeletal: Normal range of motion.     Right lower leg: No edema.     Left lower leg: No edema.  Lymphadenopathy:     Cervical: No cervical adenopathy.  Skin:    General: Skin is warm and dry.  Neurological:     General: No focal deficit present.     Mental Status: He is alert.  Psychiatric:        Mood and Affect: Mood normal.        Behavior: Behavior normal.     Lab Results  Component Value Date   WBC 10.4 06/06/2019   HGB 12.2 (L) 06/06/2019   HCT 37.0 (L) 06/06/2019   PLT 228.0 06/06/2019   GLUCOSE 62 (L) 05/17/2019    CHOL 87 10/25/2018   TRIG 120.0 10/25/2018   HDL 32.40 (L) 10/25/2018   LDLCALC 30 10/25/2018   ALT 13 10/25/2018   AST 18 10/25/2018   NA 142 05/17/2019   K 3.6 05/17/2019   CL 106 05/17/2019   CREATININE 1.31 05/17/2019   BUN 20 05/17/2019   CO2 29 05/17/2019   TSH 3.85 10/25/2018   PSA 11.21 (H) 10/30/2017  INR 1.3 ratio (H) 08/11/2010   HGBA1C 6.5 05/17/2019   MICROALBUR 0.8 10/25/2018    Dg Chest 2 View  Result Date: 05/16/2019 CLINICAL DATA:  Cough and wheezing for 2 days. EXAM: CHEST - 2 VIEW COMPARISON:  02/05/2016. FINDINGS: Cardiac silhouette is normal in size. No mediastinal or hilar masses. No evidence of adenopathy. Clear lungs.  No pleural effusion or pneumothorax. Skeletal structures are intact. IMPRESSION: No active cardiopulmonary disease. Electronically Signed   By: Lajean Manes M.D.   On: 05/16/2019 14:38    Assessment & Plan:   Erol was seen today for anemia.  Diagnoses and all orders for this visit:  Other infective chronic otitis externa of both ears -     ciprofloxacin-hydrocortisone (CIPRO HC) OTIC suspension; Place 3 drops into both ears 2 (two) times daily.  Vitamin D deficiency  Iron deficiency anemia secondary to inadequate dietary iron intake- His H&H is slightly better.  His iron level is normal.  This is likely the anemia of chronic disease. -     CBC with Differential/Platelet; Future -     IBC panel; Future -     Ferritin; Future   I am having [name]. Carlino start on Cipro HC. I am also having him maintain his PreserVision/Lutein, FreeStyle Freedom Lite, freestyle, cyanocobalamin, cholecalciferol, B-D ULTRAFINE III [place] PEN, ferrous sulfate, glucose blood, atorvastatin, Kombiglyze XR, Vasculera, ibuprofen, aspirin EC, losartan, metoprolol tartrate, torsemide, Colesevelam HCl, budesonide, Lantus SoloStar, and albuterol.  Meds ordered this encounter  Medications  . ciprofloxacin-hydrocortisone (CIPRO HC) OTIC suspension    Sig: Place 3  drops into both ears 2 (two) times daily.    Dispense:  10 mL    Refill:  1     Follow-up: Return in about 6 months (around 12/05/2019).  Scarlette Calico, MD

## 2019-06-06 NOTE — Patient Instructions (Signed)
Iron Deficiency Anemia, Adult Iron deficiency anemia is a condition in which the concentration of red blood cells or hemoglobin in the blood is below normal because of too little iron. Hemoglobin is a substance in red blood cells that carries oxygen to the body's tissues. When the concentration of red blood cells or hemoglobin is too low, not enough oxygen reaches these tissues. Iron deficiency anemia is usually long-lasting (chronic) and it develops over time. It may or may not cause symptoms. It is a common type of anemia. What are the causes? This condition may be caused by:  Not enough iron in the diet.  Blood loss caused by bleeding in the intestine.  Blood loss from a gastrointestinal condition like Crohn disease.  Frequent blood draws, such as from blood donation.  Abnormal absorption in the gut.  Heavy menstrual periods in women.  Cancers of the gastrointestinal system, such as colon cancer. What are the signs or symptoms? Symptoms of this condition may include:  Fatigue.  Headache.  Pale skin, lips, and nail beds.  Poor appetite.  Weakness.  Shortness of breath.  Dizziness.  Cold hands and feet.  Fast or irregular heartbeat.  Irritability. This is more common in severe anemia.  Rapid breathing. This is more common in severe anemia. Mild anemia may not cause any symptoms. How is this diagnosed? This condition is diagnosed based on:  Your medical history.  A physical exam.  Blood tests. You may have additional tests to find the underlying cause of your anemia, such as:  Testing for blood in the stool (fecal occult blood test).  A procedure to see inside your colon and rectum (colonoscopy).  A procedure to see inside your esophagus and stomach (endoscopy).  A test in which cells are removed from bone marrow (bone marrow aspiration) or fluid is removed from the bone marrow to be examined (biopsy). This is rarely needed. How is this treated? This  condition is treated by correcting the cause of your iron deficiency. Treatment may involve:  Adding iron-rich foods to your diet.  Taking iron supplements. If you are pregnant or breastfeeding, you may need to take extra iron because your normal diet usually does not provide the amount of iron that you need.  Increasing vitamin C intake. Vitamin C helps your body absorb iron. Your health care provider may recommend that you take iron supplements along with a glass of orange juice or a vitamin C supplement.  Medicines to make heavy menstrual flow lighter.  Surgery. You may need repeat blood tests to determine whether treatment is working. Depending on the underlying cause, the anemia should be corrected within 2 months of starting treatment. If the treatment does not seem to be working, you may need more testing. Follow these instructions at home: Medicines  Take over-the-counter and prescription medicines only as told by your health care provider. This includes iron supplements and vitamins.  If you cannot tolerate taking iron supplements by mouth, talk with your health care provider about taking them through a vein (intravenously) or an injection into a muscle.  For the best iron absorption, you should take iron supplements when your stomach is empty. If you cannot tolerate them on an empty stomach, you may need to take them with food.  Do not drink milk or take antacids at the same time as your iron supplements. Milk and antacids may interfere with iron absorption.  Iron supplements can cause constipation. To prevent constipation, include fiber in your diet as told   by your health care provider. A stool softener may also be recommended. Eating and drinking   Talk with your health care provider before changing your diet. He or she may recommend that you eat foods that contain a lot of iron, such as: ? Liver. ? Low-fat (lean) beef. ? Breads and cereals that have iron added to them (are  fortified). ? Eggs. ? Dried fruit. ? Dark green, leafy vegetables.  To help your body use the iron from iron-rich foods, eat those foods at the same time as fresh fruits and vegetables that are high in vitamin C. Foods that are high in vitamin C include: ? Oranges. ? Peppers. ? Tomatoes. ? Mangoes.  Drinkenoughfluid to keep your urine clear or pale yellow. General instructions  Return to your normal activities as told by your health care provider. Ask your health care provider what activities are safe for you.  Practice good hygiene. Anemia can make you more prone to illness and infection.  Keep all follow-up visits as told by your health care provider. This is important. Contact a health care provider if:  You feel nauseous or you vomit.  You feel weak.  You have unexplained sweating.  You develop symptoms of constipation, such as: ? Having fewer than three bowel movements a week. ? Straining to have a bowel movement. ? Having stools that are hard, dry, or larger than normal. ? Feeling full or bloated. ? Pain in the lower abdomen. ? Not feeling relief after having a bowel movement. Get help right away if:  You faint. If this happens, do not drive yourself to the hospital. Call your local emergency services (911 in the [place]  You have chest pain.  You have shortness of breath that: ? Is severe. ? Gets worse with physical activity.  You have a rapid heartbeat.  You become light-headed when getting up from a sitting or lying down position. This information is not intended to replace advice given to you by your health care provider. Make sure you discuss any questions you have with your health care provider. Document Released: 08/12/2000 Document Revised: 07/28/2017 Document Reviewed: 05/04/2016 Elsevier Patient Education  2020 Elsevier Inc.  

## 2019-06-18 ENCOUNTER — Other Ambulatory Visit: Payer: Self-pay | Admitting: Internal Medicine

## 2019-06-18 DIAGNOSIS — H353211 Exudative age-related macular degeneration, right eye, with active choroidal neovascularization: Secondary | ICD-10-CM | POA: Diagnosis not present

## 2019-06-18 DIAGNOSIS — H353223 Exudative age-related macular degeneration, left eye, with inactive scar: Secondary | ICD-10-CM | POA: Diagnosis not present

## 2019-06-18 DIAGNOSIS — E118 Type 2 diabetes mellitus with unspecified complications: Secondary | ICD-10-CM

## 2019-06-18 DIAGNOSIS — E785 Hyperlipidemia, unspecified: Secondary | ICD-10-CM

## 2019-06-18 DIAGNOSIS — H43813 Vitreous degeneration, bilateral: Secondary | ICD-10-CM | POA: Diagnosis not present

## 2019-06-18 DIAGNOSIS — H35371 Puckering of macula, right eye: Secondary | ICD-10-CM | POA: Diagnosis not present

## 2019-07-11 ENCOUNTER — Other Ambulatory Visit: Payer: Self-pay | Admitting: Internal Medicine

## 2019-08-01 DIAGNOSIS — L570 Actinic keratosis: Secondary | ICD-10-CM | POA: Diagnosis not present

## 2019-08-01 DIAGNOSIS — L989 Disorder of the skin and subcutaneous tissue, unspecified: Secondary | ICD-10-CM | POA: Diagnosis not present

## 2019-08-06 DIAGNOSIS — H353223 Exudative age-related macular degeneration, left eye, with inactive scar: Secondary | ICD-10-CM | POA: Diagnosis not present

## 2019-08-06 DIAGNOSIS — H353211 Exudative age-related macular degeneration, right eye, with active choroidal neovascularization: Secondary | ICD-10-CM | POA: Diagnosis not present

## 2019-08-06 DIAGNOSIS — H43813 Vitreous degeneration, bilateral: Secondary | ICD-10-CM | POA: Diagnosis not present

## 2019-09-05 LAB — HM DIABETES EYE EXAM

## 2019-09-11 ENCOUNTER — Telehealth: Payer: Self-pay | Admitting: Internal Medicine

## 2019-09-11 NOTE — Telephone Encounter (Signed)
[facility] referred pt to "[facility]". It is service that goes out to patient's home. Similar to [facility]. They are wanting to make sure that you are okay that they go out to see patient. It is a courtesy call.

## 2019-09-11 NOTE — Telephone Encounter (Signed)
Copied from [place] [id number]. Topic: General - Other >> Sep 11, 2019 11:10 AM Yvette Rack wrote: Reason for CRM: Josetta Huddle a RN with [facility] requests call back from Dr. Ronnald Ramp nurse. Cb# [phone]

## 2019-09-11 NOTE — Telephone Encounter (Signed)
[facility] referred pt to [facility]. Cindy Tuberville, RN would like to know if PCP has any objections for them to continue servicing [name]?  

## 2019-09-11 NOTE — Telephone Encounter (Signed)
I don't know what this is  TJ

## 2019-09-16 NOTE — Telephone Encounter (Signed)
lvm for Nicholas Burton to call back. I have some important questions for her, please get me if she calls back.

## 2019-09-17 ENCOUNTER — Other Ambulatory Visit: Payer: Self-pay

## 2019-09-17 ENCOUNTER — Encounter: Payer: Self-pay | Admitting: Internal Medicine

## 2019-09-17 ENCOUNTER — Ambulatory Visit (INDEPENDENT_AMBULATORY_CARE_PROVIDER_SITE_OTHER): Payer: Medicare Other | Admitting: Internal Medicine

## 2019-09-17 VITALS — BP 138/64 | HR 74 | Temp 98.0°F | Resp 16 | Ht 69.0 in | Wt 199.0 lb

## 2019-09-17 DIAGNOSIS — Z0000 Encounter for general adult medical examination without abnormal findings: Secondary | ICD-10-CM | POA: Diagnosis not present

## 2019-09-17 DIAGNOSIS — E538 Deficiency of other specified B group vitamins: Secondary | ICD-10-CM

## 2019-09-17 DIAGNOSIS — I10 Essential (primary) hypertension: Secondary | ICD-10-CM | POA: Diagnosis not present

## 2019-09-17 DIAGNOSIS — Z23 Encounter for immunization: Secondary | ICD-10-CM

## 2019-09-17 DIAGNOSIS — N2889 Other specified disorders of kidney and ureter: Secondary | ICD-10-CM

## 2019-09-17 DIAGNOSIS — J4541 Moderate persistent asthma with (acute) exacerbation: Secondary | ICD-10-CM

## 2019-09-17 DIAGNOSIS — E785 Hyperlipidemia, unspecified: Secondary | ICD-10-CM

## 2019-09-17 DIAGNOSIS — E118 Type 2 diabetes mellitus with unspecified complications: Secondary | ICD-10-CM

## 2019-09-17 DIAGNOSIS — I5042 Chronic combined systolic (congestive) and diastolic (congestive) heart failure: Secondary | ICD-10-CM | POA: Diagnosis not present

## 2019-09-17 DIAGNOSIS — J449 Chronic obstructive pulmonary disease, unspecified: Secondary | ICD-10-CM | POA: Diagnosis not present

## 2019-09-17 DIAGNOSIS — N183 Chronic kidney disease, stage 3 unspecified: Secondary | ICD-10-CM | POA: Diagnosis not present

## 2019-09-17 DIAGNOSIS — D508 Other iron deficiency anemias: Secondary | ICD-10-CM

## 2019-09-17 DIAGNOSIS — J4489 Other specified chronic obstructive pulmonary disease: Secondary | ICD-10-CM

## 2019-09-17 LAB — BASIC METABOLIC PANEL
BUN: 27 mg/dL — ABNORMAL HIGH (ref 6–23)
CO2: 27 mEq/L (ref 19–32)
Calcium: 8.9 mg/dL (ref 8.4–10.5)
Chloride: 103 mEq/L (ref 96–112)
Creatinine, Ser: 1.63 mg/dL — ABNORMAL HIGH (ref 0.40–1.50)
GFR: 39.97 mL/min — ABNORMAL LOW (ref >60.00)
Glucose, Bld: 192 mg/dL — ABNORMAL HIGH (ref 70–99)
Potassium: 4.3 mEq/L (ref 3.5–5.1)
Sodium: 139 mEq/L (ref 135–145)

## 2019-09-17 LAB — IBC PANEL
Iron: 75 ug/dL (ref 42–165)
Saturation Ratios: 24.7 % (ref 20.0–50.0)
Transferrin: 217 mg/dL (ref 212.0–360.0)

## 2019-09-17 LAB — LIPID PANEL
Cholesterol: 83 mg/dL (ref 0–200)
HDL: 36.4 mg/dL — ABNORMAL LOW (ref >39.00)
LDL Cholesterol: 19 mg/dL (ref 0–99)
NonHDL: 46.54
Total CHOL/HDL Ratio: 2
Triglycerides: 136 mg/dL (ref 0.0–149.0)
VLDL: 27.2 mg/dL (ref 0.0–40.0)

## 2019-09-17 LAB — CBC WITH DIFFERENTIAL/PLATELET
Basophils Absolute: 0 10*3/uL (ref 0.0–0.1)
Basophils Relative: 0.4 % (ref 0.0–3.0)
Eosinophils Absolute: 0.3 10*3/uL (ref 0.0–0.7)
Eosinophils Relative: 3.2 % (ref 0.0–5.0)
HCT: 37.2 % — ABNORMAL LOW (ref 39.0–52.0)
Hemoglobin: 12 g/dL — ABNORMAL LOW (ref 13.0–17.0)
Lymphocytes Relative: 26.8 % (ref 12.0–46.0)
Lymphs Abs: 2.9 10*3/uL (ref 0.7–4.0)
MCHC: 32.4 g/dL (ref 30.0–36.0)
MCV: 88.5 fl (ref 78.0–100.0)
Monocytes Absolute: 1.1 10*3/uL — ABNORMAL HIGH (ref 0.1–1.0)
Monocytes Relative: 10.5 % (ref 3.0–12.0)
Neutro Abs: 6.3 10*3/uL (ref 1.4–7.7)
Neutrophils Relative %: 59.1 % (ref 43.0–77.0)
Platelets: 248 10*3/uL (ref 150.0–400.0)
RBC: 4.2 Mil/uL — ABNORMAL LOW (ref 4.22–5.81)
RDW: 14.5 % (ref 11.5–15.5)
WBC: 10.7 10*3/uL — ABNORMAL HIGH (ref 4.0–10.5)

## 2019-09-17 LAB — URINALYSIS, ROUTINE W REFLEX MICROSCOPIC
Bilirubin Urine: NEGATIVE
Hgb urine dipstick: NEGATIVE
Leukocytes,Ua: NEGATIVE
Nitrite: NEGATIVE
Specific Gravity, Urine: 1.02 (ref 1.000–1.030)
Total Protein, Urine: NEGATIVE
Urine Glucose: NEGATIVE
Urobilinogen, UA: 0.2 (ref 0.0–1.0)
pH: 5 (ref 5.0–8.0)

## 2019-09-17 LAB — HEPATIC FUNCTION PANEL
ALT: 13 U/L (ref 0–53)
AST: 17 U/L (ref 0–37)
Albumin: 3.6 g/dL (ref 3.5–5.2)
Alkaline Phosphatase: 113 U/L (ref 39–117)
Bilirubin, Direct: 0.1 mg/dL (ref 0.0–0.3)
Total Bilirubin: 0.4 mg/dL (ref 0.2–1.2)
Total Protein: 5.6 g/dL — ABNORMAL LOW (ref 6.0–8.3)

## 2019-09-17 LAB — MICROALBUMIN / CREATININE URINE RATIO
Creatinine,U: 148 mg/dL
Microalb Creat Ratio: 0.5 mg/g (ref 0.0–30.0)
Microalb, Ur: 0.7 mg/dL (ref 0.0–1.9)

## 2019-09-17 LAB — VITAMIN B12: Vitamin B-12: 613 pg/mL (ref 211–911)

## 2019-09-17 LAB — TSH: TSH: 4.25 u[IU]/mL (ref 0.35–4.50)

## 2019-09-17 LAB — FOLATE: Folate: 9.4 ng/mL (ref >5.9)

## 2019-09-17 LAB — HEMOGLOBIN A1C: Hgb A1c MFr Bld: 6.7 % — ABNORMAL HIGH (ref 4.6–6.5)

## 2019-09-17 LAB — FERRITIN: Ferritin: 34.1 ng/mL (ref 22.0–322.0)

## 2019-09-17 NOTE — Telephone Encounter (Signed)
Pt spoke to pcp and pcp approved

## 2019-09-17 NOTE — Telephone Encounter (Signed)
Follow up    Please return call to [facility] with [facility] CB:[phone]

## 2019-09-17 NOTE — Telephone Encounter (Signed)
Contacted Cindy and gave verbal okay per PCP.

## 2019-09-17 NOTE — Patient Instructions (Signed)
Type 2 Diabetes Mellitus, Diagnosis, Adult Type 2 diabetes (type 2 diabetes mellitus) is a long-term (chronic) disease. In type 2 diabetes, one or both of these problems may be present:  The pancreas does not make enough of a hormone called insulin.  Cells in the body do not respond properly to insulin that the body makes (insulin resistance). Normally, insulin allows blood sugar (glucose) to enter cells in the body. The cells use glucose for energy. Insulin resistance or lack of insulin causes excess glucose to build up in the blood instead of going into cells. As a result, high blood glucose (hyperglycemia) develops. What increases the risk? The following factors may make you more likely to develop type 2 diabetes:  Having a family member with type 2 diabetes.  Being overweight or obese.  Having an inactive (sedentary) lifestyle.  Having been diagnosed with insulin resistance.  Having a history of prediabetes, gestational diabetes, or polycystic ovary syndrome (PCOS).  Being of American-Indian, African-American, Hispanic/Latino, or Asian/Pacific Islander descent. What are the signs or symptoms? In the early stage of this condition, you may not have symptoms. Symptoms develop slowly and may include:  Increased thirst (polydipsia).  Increased hunger(polyphagia).  Increased urination (polyuria).  Increased urination during the night (nocturia).  Unexplained weight loss.  Frequent infections that keep coming back (recurring).  Fatigue.  Weakness.  Vision changes, such as blurry vision.  Cuts or bruises that are slow to heal.  Tingling or numbness in the hands or feet.  Dark patches on the skin (acanthosis nigricans). How is this diagnosed? This condition is diagnosed based on your symptoms, your medical history, a physical exam, and your blood glucose level. Your blood glucose may be checked with one or more of the following blood tests:  A fasting blood glucose (FBG)  test. You will not be allowed to eat (you will fast) for 8 hours or longer before a blood sample is taken.  A random blood glucose test. This test checks blood glucose at any time of day regardless of when you ate.  An A1c (hemoglobin A1c) blood test. This test provides information about blood glucose control over the previous 2-3 months.  An oral glucose tolerance test (OGTT). This test measures your blood glucose at two times: ? After fasting. This is your baseline blood glucose level. ? Two hours after drinking a beverage that contains glucose. You may be diagnosed with type 2 diabetes if:  Your FBG level is 126 mg/dL (7.0 mmol/L) or higher.  Your random blood glucose level is 200 mg/dL (11.1 mmol/L) or higher.  Your A1c level is 6.5% or higher.  Your OGTT result is higher than 200 mg/dL (11.1 mmol/L). These blood tests may be repeated to confirm your diagnosis. How is this treated? Your treatment may be managed by a specialist called an endocrinologist. Type 2 diabetes may be treated by following instructions from your health care provider about:  Making diet and lifestyle changes. This may include: ? Following an individualized nutrition plan that is developed by a diet and nutrition specialist (registered dietitian). ? Exercising regularly. ? Finding ways to manage stress.  Checking your blood glucose level as often as told.  Taking diabetes medicines or insulin daily. This helps to keep your blood glucose levels in the healthy range. ? If you use insulin, you may need to adjust the dosage depending on how physically active you are and what foods you eat. Your health care provider will tell you how to adjust your dosage.    Taking medicines to help prevent complications from diabetes, such as: ? Aspirin. ? Medicine to lower cholesterol. ? Medicine to control blood pressure. Your health care provider will set individualized treatment goals for you. Your goals will be based on  your age, other medical conditions you have, and how you respond to diabetes treatment. Generally, the goal of treatment is to maintain the following blood glucose levels:  Before meals (preprandial): 80-130 mg/dL (4.4-7.2 mmol/L).  After meals (postprandial): below 180 mg/dL (10 mmol/L).  A1c level: less than 7%. Follow these instructions at home: Questions to ask your health care provider  Consider asking the following questions: ? Do I need to meet with a diabetes educator? ? Where can I find a support group for people with diabetes? ? What equipment will I need to manage my diabetes at home? ? What diabetes medicines do I need, and when should I take them? ? How often do I need to check my blood glucose? ? What number can I call if I have questions? ? When is my next appointment? General instructions  Take over-the-counter and prescription medicines only as told by your health care provider.  Keep all follow-up visits as told by your health care provider. This is important.  For more information about diabetes, visit: ? American Diabetes Association (ADA): www.diabetes.org ? American Association of Diabetes Educators (AADE): www.diabeteseducator.org Contact a health care provider if:  Your blood glucose is at or above 240 mg/dL (13.3 mmol/L) for 2 days in a row.  You have been sick or have had a fever for 2 days or longer, and you are not getting better.  You have any of the following problems for more than 6 hours: ? You cannot eat or drink. ? You have nausea and vomiting. ? You have diarrhea. Get help right away if:  Your blood glucose is lower than 54 mg/dL (3.0 mmol/L).  You become confused or you have trouble thinking clearly.  You have difficulty breathing.  You have moderate or large ketone levels in your urine. Summary  Type 2 diabetes (type 2 diabetes mellitus) is a long-term (chronic) disease. In type 2 diabetes, the pancreas does not make enough of a  hormone called insulin, or cells in the body do not respond properly to insulin that the body makes (insulin resistance).  This condition is treated by making diet and lifestyle changes and taking diabetes medicines or insulin.  Your health care provider will set individualized treatment goals for you. Your goals will be based on your age, other medical conditions you have, and how you respond to diabetes treatment.  Keep all follow-up visits as told by your health care provider. This is important. This information is not intended to replace advice given to you by your health care provider. Make sure you discuss any questions you have with your health care provider. Document Revised: 10/13/2017 Document Reviewed: 09/18/2015 Elsevier Patient Education  2020 Elsevier Inc.  

## 2019-09-17 NOTE — Progress Notes (Signed)
Subjective:  Patient ID: Nicholas Burton, male    DOB: 02-Nov-1927  Age: 86 y.o. MRN: [id number]  CC: Annual Exam  This visit occurred during the SARS-CoV-2 public health emergency.  Safety protocols were in place, including screening questions prior to the visit, additional usage of staff PPE, and extensive cleaning of exam room while observing appropriate contact time as indicated for disinfecting solutions.    HPI Nicholas Burton presents for a CPX.  He reports that he is in his usual state of health today.  He tells me his blood pressure and blood sugars have been well controlled.  He is compliant with the nebulized meds and denies any recent episodes of cough, shortness of breath, or wheezing.  Outpatient Medications Prior to Visit  Medication Sig Dispense Refill  . albuterol (PROVENTIL) (2.5 MG/3ML) 0.083% nebulizer solution Take 3 mLs by nebulization every 6 hours and as needed for wheezing or shortness of breath. 300 mL 1  . aspirin EC 81 MG tablet Take 81 mg by mouth daily.    Marland Kitchen atorvastatin (LIPITOR) 10 MG tablet TAKE 1 TABLET DAILY 90 tablet 1  . B-D ULTRAFINE III [place] PEN 31G X 8 MM MISC USE TO CHECK BLOOD SUGAR UP TO THREE TIMES A DAY 300 each 3  . Blood Glucose Monitoring Suppl (FREESTYLE FREEDOM LITE) W/DEVICE KIT Use to check blood sugars daily Dx E11.8 1 each 0  . budesonide (PULMICORT) 0.5 MG/2ML nebulizer solution Take 2 mLs (0.5 mg total) by nebulization 2 (two) times daily. 360 mL 1  . cholecalciferol (VITAMIN D) 1000 units tablet Take 1,000 Units by mouth daily.    . Colesevelam HCl 3.75 g PACK MIX THE CONTENTS OF 1 PACKET IN FLUID AND DRINK DAILY 90 each 1  . cyanocobalamin 1000 MCG tablet Take 1,000 mcg by mouth daily.    . Dietary Management Product (VASCULERA) TABS TAKE ONE TABLET BY MOUTH DAILY 30 tablet 11  . glucose blood (FREESTYLE LITE) test strip Use as instructed to check blood sugar bid. DX: E11.9 200 each 3  . Insulin Glargine (LANTUS SOLOSTAR) 100 UNIT/ML  Solostar Pen Inject 30 Units into the skin daily. 45 mL 3  . KOMBIGLYZE XR 2.12-998 MG TB24 TAKE 2 TABLETS DAILY 180 tablet 1  . Lancets (FREESTYLE) lancets Use to check blood sugars twice day Dx E11.8 180 each 3  . losartan (COZAAR) 25 MG tablet Take 1 tablet (25 mg total) by mouth daily. 90 tablet 3  . metoprolol tartrate (LOPRESSOR) 25 MG tablet Take 0.5 tablets (12.5 mg total) by mouth 2 (two) times daily. 90 tablet 3  . Multiple Vitamins-Minerals (PRESERVISION/LUTEIN) CAPS Take 1 capsule by mouth 2 (two) times daily.      Marland Kitchen torsemide (DEMADEX) 10 MG tablet TAKE 1 TABLET DAILY 90 tablet 1  . ferrous sulfate 325 (65 FE) MG tablet Take 1 tablet (325 mg total) by mouth 2 (two) times daily with a meal. (Patient not taking: Reported on 09/17/2019) 180 tablet 1  . ciprofloxacin-hydrocortisone (CIPRO HC) OTIC suspension Place 3 drops into both ears 2 (two) times daily. (Patient not taking: Reported on 09/17/2019) 10 mL 1  . ibuprofen (ADVIL) 200 MG tablet Take 200 mg by mouth every 6 (six) hours as needed.     No facility-administered medications prior to visit.    ROS Review of Systems  Constitutional: Negative for chills, diaphoresis, fatigue, fever and unexpected weight change.  HENT: Negative.   Eyes: Negative for visual disturbance.  Respiratory: Negative  for cough, chest tightness, shortness of breath and wheezing.   Cardiovascular: Positive for leg swelling. Negative for chest pain and palpitations.  Gastrointestinal: Negative for abdominal pain, constipation, diarrhea, nausea and vomiting.  Endocrine: Negative.  Negative for polydipsia, polyphagia and polyuria.  Genitourinary: Negative.   Musculoskeletal: Negative.  Negative for arthralgias and myalgias.  Skin: Negative.  Negative for color change.  Neurological: Negative for dizziness, weakness and light-headedness.  Hematological: Negative for adenopathy. Does not bruise/bleed easily.  Psychiatric/Behavioral: Negative.      Objective:  BP 138/64 (BP Location: Left Arm, Patient Position: Sitting, Cuff Size: Large)   Pulse 74   Temp 98 F (36.7 C) (Oral)   Resp 16   Ht '5\' 9"'$  (1.753 m)   Wt 199 lb (90.3 kg)   SpO2 96%   BMI 29.39 kg/m   BP Readings from Last 3 Encounters:  09/17/19 138/64  06/06/19 (!) 148/80  05/16/19 130/70    Wt Readings from Last 3 Encounters:  09/17/19 199 lb (90.3 kg)  06/06/19 199 lb 8 oz (90.5 kg)  05/16/19 200 lb (90.7 kg)    Physical Exam Vitals reviewed.  Constitutional:      Appearance: He is obese.  HENT:     Nose: Nose normal.     Mouth/Throat:     Mouth: Mucous membranes are moist.  Eyes:     General: No scleral icterus.    Conjunctiva/sclera: Conjunctivae normal.  Cardiovascular:     Rate and Rhythm: Normal rate and regular rhythm.     Pulses: Normal pulses.     Heart sounds: No murmur.  Pulmonary:     Effort: Pulmonary effort is normal.     Breath sounds: Normal breath sounds. No stridor. No wheezing, rhonchi or rales.  Abdominal:     General: Abdomen is protuberant. Bowel sounds are normal. There is no distension.     Palpations: Abdomen is soft. There is no hepatomegaly, splenomegaly or mass.     Tenderness: There is no abdominal tenderness.  Musculoskeletal:        General: Normal range of motion.     Cervical back: Neck supple.     Right lower leg: 1+ Pitting Edema present.     Left lower leg: 1+ Pitting Edema present.  Lymphadenopathy:     Cervical: No cervical adenopathy.  Skin:    General: Skin is warm and dry.  Neurological:     General: No focal deficit present.     Mental Status: He is alert and oriented to person, place, and time. Mental status is at baseline.  Psychiatric:        Mood and Affect: Mood normal.        Behavior: Behavior normal.     Lab Results  Component Value Date   WBC 10.7 (H) 09/17/2019   HGB 12.0 (L) 09/17/2019   HCT 37.2 (L) 09/17/2019   PLT 248.0 09/17/2019   GLUCOSE 192 (H) 09/17/2019   CHOL 83  09/17/2019   TRIG 136.0 09/17/2019   HDL 36.40 (L) 09/17/2019   LDLCALC 19 09/17/2019   ALT 13 09/17/2019   AST 17 09/17/2019   NA 139 09/17/2019   K 4.3 09/17/2019   CL 103 09/17/2019   CREATININE 1.63 (H) 09/17/2019   BUN 27 (H) 09/17/2019   CO2 27 09/17/2019   TSH 4.25 09/17/2019   PSA 11.21 (H) 10/30/2017   INR 1.3 ratio (H) 08/11/2010   HGBA1C 6.7 (H) 09/17/2019   MICROALBUR 0.7 09/17/2019  DG Chest 2 View  Result Date: 05/16/2019 CLINICAL DATA:  Cough and wheezing for 2 days. EXAM: CHEST - 2 VIEW COMPARISON:  02/05/2016. FINDINGS: Cardiac silhouette is normal in size. No mediastinal or hilar masses. No evidence of adenopathy. Clear lungs.  No pleural effusion or pneumothorax. Skeletal structures are intact. IMPRESSION: No active cardiopulmonary disease. Electronically Signed   By: Lajean Manes M.D.   On: 05/16/2019 14:38    Assessment & Plan:   Aneesh was seen today for annual exam.  Diagnoses and all orders for this visit:  Essential hypertension- His blood pressure is adequately well controlled.  Will continue the current antihypertensives. -     TSH -     Urinalysis, Routine w reflex microscopic -     Basic metabolic panel  Type II diabetes mellitus with manifestations ([place])- His A1c is at 6.7%.  His blood sugars are adequately well controlled. -     Hemoglobin A1c -     Microalbumin / creatinine urine ratio -     Basic metabolic panel -     HM DIABETES FOOT EXAM  B12 deficiency- His H&H remain mildly low.  His B12 and folate levels are normal.  He likely has the anemia of chronic disease and renal insufficiency. -     CBC with Differential/Platelet -     Vitamin B12 -     Folate  Iron deficiency anemia secondary to inadequate dietary iron intake- His H&H remain mildly low but his iron level is normal.  He likely has the anemia of chronic disease and renal insufficiency. -     CBC with Differential/Platelet -     IBC panel -     Ferritin  CRI (chronic  renal insufficiency), stage 3 (moderate)- His renal function is stable.  He will avoid nephrotoxic agents.  We will continue to maintain control of his blood pressure and his blood sugars.  Hyperlipidemia with target LDL less than 100- He has achieved his LDL goal and is doing well on the statin. -     Lipid panel -     TSH -     Hepatic function panel  Routine general medical examination at a health care facility- Exam completed, labs reviewed, vaccines reviewed and updated, no cancer screenings are indicated, patient education was given.  Need for pneumococcal vaccination -     Pneumococcal polysaccharide vaccine 23-valent greater than or equal to 2yo subcutaneous/IM  Chronic combined systolic and diastolic congestive heart failure ([place])- He has his baseline level of lower extremity edema but is not gaining weight.  Will continue the current dose of the loop diuretic.  COPD with asthma ([place])- He is doing well on the current nebulized meds.  Will continue.  Moderate persistent asthma with acute exacerbation- He is doing well on the current nebulized meds.  Will continue.   I have discontinued [name]. Horkey's ibuprofen and Cipro HC. I am also having him maintain his PreserVision/Lutein, FreeStyle Freedom Lite, freestyle, cyanocobalamin, cholecalciferol, B-D ULTRAFINE III [place] PEN, ferrous sulfate, glucose blood, Vasculera, aspirin EC, losartan, metoprolol tartrate, torsemide, Colesevelam HCl, budesonide, Lantus SoloStar, albuterol, atorvastatin, and Kombiglyze XR.  No orders of the defined types were placed in this encounter.    Follow-up: Return in about 6 months (around 03/16/2020).  Scarlette Calico, MD

## 2019-10-12 DIAGNOSIS — Z23 Encounter for immunization: Secondary | ICD-10-CM | POA: Diagnosis not present

## 2019-10-15 DIAGNOSIS — H353223 Exudative age-related macular degeneration, left eye, with inactive scar: Secondary | ICD-10-CM | POA: Diagnosis not present

## 2019-10-15 DIAGNOSIS — H43813 Vitreous degeneration, bilateral: Secondary | ICD-10-CM | POA: Diagnosis not present

## 2019-10-15 DIAGNOSIS — H353211 Exudative age-related macular degeneration, right eye, with active choroidal neovascularization: Secondary | ICD-10-CM | POA: Diagnosis not present

## 2019-10-24 DIAGNOSIS — Z85828 Personal history of other malignant neoplasm of skin: Secondary | ICD-10-CM | POA: Diagnosis not present

## 2019-10-24 DIAGNOSIS — L819 Disorder of pigmentation, unspecified: Secondary | ICD-10-CM | POA: Diagnosis not present

## 2019-10-24 DIAGNOSIS — L570 Actinic keratosis: Secondary | ICD-10-CM | POA: Diagnosis not present

## 2019-10-24 DIAGNOSIS — L905 Scar conditions and fibrosis of skin: Secondary | ICD-10-CM | POA: Diagnosis not present

## 2019-11-10 DIAGNOSIS — Z23 Encounter for immunization: Secondary | ICD-10-CM | POA: Diagnosis not present

## 2019-11-11 ENCOUNTER — Other Ambulatory Visit: Payer: Self-pay | Admitting: Internal Medicine

## 2019-11-11 DIAGNOSIS — I10 Essential (primary) hypertension: Secondary | ICD-10-CM

## 2019-11-20 DIAGNOSIS — L570 Actinic keratosis: Secondary | ICD-10-CM | POA: Diagnosis not present

## 2019-11-20 DIAGNOSIS — L819 Disorder of pigmentation, unspecified: Secondary | ICD-10-CM | POA: Diagnosis not present

## 2019-12-02 ENCOUNTER — Telehealth: Payer: Self-pay | Admitting: Internal Medicine

## 2019-12-02 DIAGNOSIS — Z794 Long term (current) use of insulin: Secondary | ICD-10-CM

## 2019-12-02 DIAGNOSIS — E118 Type 2 diabetes mellitus with unspecified complications: Secondary | ICD-10-CM

## 2019-12-02 MED ORDER — LANTUS SOLOSTAR 100 UNIT/ML ~~LOC~~ SOPN: 30.0000 [IU] | PEN_INJECTOR | Freq: Every day | SUBCUTANEOUS | 3 refills | Status: DC

## 2019-12-02 MED ORDER — BD PEN NEEDLE SHORT U/F 31G X 8 MM MISC: 3 refills | Status: DC

## 2019-12-02 NOTE — Telephone Encounter (Signed)
erx has been sent as requested.  

## 2019-12-02 NOTE — Telephone Encounter (Signed)
New message:   Pt's wife is calling and states he needs new prescriptions sent to [facility] [place]  Pt needs the solostar needles for his insulin pen.

## 2019-12-15 ENCOUNTER — Other Ambulatory Visit: Payer: Self-pay | Admitting: Internal Medicine

## 2019-12-15 DIAGNOSIS — E118 Type 2 diabetes mellitus with unspecified complications: Secondary | ICD-10-CM

## 2019-12-15 DIAGNOSIS — E785 Hyperlipidemia, unspecified: Secondary | ICD-10-CM

## 2020-01-07 ENCOUNTER — Other Ambulatory Visit: Payer: Self-pay | Admitting: Internal Medicine

## 2020-01-07 DIAGNOSIS — H43813 Vitreous degeneration, bilateral: Secondary | ICD-10-CM | POA: Diagnosis not present

## 2020-01-07 DIAGNOSIS — H3540 Unspecified peripheral retinal degeneration: Secondary | ICD-10-CM | POA: Diagnosis not present

## 2020-01-07 DIAGNOSIS — H353211 Exudative age-related macular degeneration, right eye, with active choroidal neovascularization: Secondary | ICD-10-CM | POA: Diagnosis not present

## 2020-01-07 DIAGNOSIS — H353223 Exudative age-related macular degeneration, left eye, with inactive scar: Secondary | ICD-10-CM | POA: Diagnosis not present

## 2020-02-10 ENCOUNTER — Other Ambulatory Visit: Payer: Self-pay | Admitting: Internal Medicine

## 2020-02-19 ENCOUNTER — Other Ambulatory Visit: Payer: Self-pay

## 2020-02-19 DIAGNOSIS — I872 Venous insufficiency (chronic) (peripheral): Secondary | ICD-10-CM

## 2020-02-19 MED ORDER — VASCULERA PO TABS: 1.0000 | ORAL_TABLET | Freq: Every day | ORAL | 11 refills | Status: DC

## 2020-02-20 ENCOUNTER — Ambulatory Visit: Payer: Medicare Other

## 2020-03-16 ENCOUNTER — Ambulatory Visit: Payer: Medicare Other

## 2020-03-16 ENCOUNTER — Ambulatory Visit: Payer: Medicare Other | Admitting: Internal Medicine

## 2020-03-18 ENCOUNTER — Encounter: Payer: Self-pay | Admitting: Internal Medicine

## 2020-03-18 ENCOUNTER — Ambulatory Visit (INDEPENDENT_AMBULATORY_CARE_PROVIDER_SITE_OTHER): Payer: Medicare Other

## 2020-03-18 ENCOUNTER — Other Ambulatory Visit: Payer: Self-pay

## 2020-03-18 ENCOUNTER — Ambulatory Visit (INDEPENDENT_AMBULATORY_CARE_PROVIDER_SITE_OTHER): Payer: Medicare Other | Admitting: Internal Medicine

## 2020-03-18 VITALS — BP 120/70 | HR 68 | Temp 98.4°F | Resp 16 | Ht 69.0 in | Wt 198.0 lb

## 2020-03-18 VITALS — BP 120/70 | HR 68 | Temp 98.4°F | Resp 16 | Ht 69.0 in | Wt 198.2 lb

## 2020-03-18 DIAGNOSIS — I10 Essential (primary) hypertension: Secondary | ICD-10-CM

## 2020-03-18 DIAGNOSIS — N3281 Overactive bladder: Secondary | ICD-10-CM

## 2020-03-18 DIAGNOSIS — E538 Deficiency of other specified B group vitamins: Secondary | ICD-10-CM

## 2020-03-18 DIAGNOSIS — E118 Type 2 diabetes mellitus with unspecified complications: Secondary | ICD-10-CM

## 2020-03-18 DIAGNOSIS — N183 Chronic kidney disease, stage 3 unspecified: Secondary | ICD-10-CM

## 2020-03-18 DIAGNOSIS — Z0000 Encounter for general adult medical examination without abnormal findings: Secondary | ICD-10-CM

## 2020-03-18 DIAGNOSIS — J449 Chronic obstructive pulmonary disease, unspecified: Secondary | ICD-10-CM | POA: Diagnosis not present

## 2020-03-18 DIAGNOSIS — J4489 Other specified chronic obstructive pulmonary disease: Secondary | ICD-10-CM

## 2020-03-18 NOTE — Progress Notes (Signed)
Subjective:  Patient ID: Nicholas Burton, male    DOB: 07/15/1928  Age: 86 y.o. MRN: [id number]  CC: Cough and Diabetes  This visit occurred during the SARS-CoV-2 public health emergency.  Safety protocols were in place, including screening questions prior to the visit, additional usage of staff PPE, and extensive cleaning of exam room while observing appropriate contact time as indicated for disinfecting solutions.    HPI Nicholas Burton presents for f/up - He continues to complain of cough.  The cough is rarely productive of phlegm.  He does not think the nebulized ICS or beta agonist are helping.  He denies chest pain, hemoptysis, fever, chills, wheezing, shortness of breath, or DOE.  He also complains of frequent urination during the day and night.  Outpatient Medications Prior to Visit  Medication Sig Dispense Refill  . aspirin EC 81 MG tablet Take 81 mg by mouth daily.    Marland Kitchen atorvastatin (LIPITOR) 10 MG tablet TAKE 1 TABLET DAILY 90 tablet 1  . Blood Glucose Monitoring Suppl (FREESTYLE FREEDOM LITE) W/DEVICE KIT Use to check blood sugars daily Dx E11.8 1 each 0  . cholecalciferol (VITAMIN D) 1000 units tablet Take 1,000 Units by mouth daily.    . Colesevelam HCl 3.75 g PACK MIX THE CONTENTS OF 1 PACKET IN FLUID AND DRINK DAILY 90 each 1  . cyanocobalamin 1000 MCG tablet Take 1,000 mcg by mouth daily.    . Dietary Management Product (VASCULERA) TABS Take 1 tablet by mouth daily. 30 tablet 11  . glucose blood (FREESTYLE LITE) test strip USE AS INSTRUCTED TO CHECK BLOOD SUGAR TWICE A DAY 200 each 3  . Insulin Pen Needle (B-D ULTRAFINE III [place] PEN) 31G X 8 MM MISC Use to inject insulin daily. DX: E11.9 300 each 3  . Lancets (FREESTYLE) lancets Use to check blood sugars twice day Dx E11.8 180 each 3  . losartan (COZAAR) 25 MG tablet Take 1 tablet (25 mg total) by mouth daily. 90 tablet 3  . metoprolol tartrate (LOPRESSOR) 25 MG tablet Take 0.5 tablets (12.5 mg total) by mouth 2 (two) times  daily. 90 tablet 3  . Multiple Vitamins-Minerals (PRESERVISION/LUTEIN) CAPS Take 1 capsule by mouth 2 (two) times daily.      Marland Kitchen torsemide (DEMADEX) 10 MG tablet TAKE 1 TABLET DAILY 90 tablet 1  . budesonide (PULMICORT) 0.5 MG/2ML nebulizer solution Take 2 mLs (0.5 mg total) by nebulization 2 (two) times daily. 360 mL 1  . insulin glargine (LANTUS SOLOSTAR) 100 UNIT/ML Solostar Pen Inject 30 Units into the skin daily. 45 mL 3  . KOMBIGLYZE XR 2.12-998 MG TB24 TAKE 2 TABLETS DAILY 180 tablet 0  . albuterol (PROVENTIL) (2.5 MG/3ML) 0.083% nebulizer solution Take 3 mLs by nebulization every 6 hours and as needed for wheezing or shortness of breath. (Patient not taking: Reported on 03/18/2020) 300 mL 1  . ferrous sulfate 325 (65 FE) MG tablet Take 1 tablet (325 mg total) by mouth 2 (two) times daily with a meal. (Patient not taking: Reported on 09/17/2019) 180 tablet 1   No facility-administered medications prior to visit.    ROS Review of Systems  Constitutional: Negative.  Negative for appetite change, chills, diaphoresis, fatigue and fever.  HENT: Negative.  Negative for trouble swallowing.   Eyes: Negative.   Respiratory: Positive for cough. Negative for chest tightness, shortness of breath and wheezing.   Cardiovascular: Positive for palpitations. Negative for chest pain and leg swelling.  Gastrointestinal: Negative for abdominal pain,  constipation, diarrhea, nausea and vomiting.  Endocrine: Positive for polyuria.  Genitourinary: Positive for frequency. Negative for difficulty urinating, dysuria, hematuria and urgency.  Musculoskeletal: Negative.  Negative for arthralgias.  Skin: Negative for color change, pallor and rash.  Neurological: Negative.  Negative for dizziness, weakness, light-headedness and numbness.  Hematological: Negative for adenopathy. Does not bruise/bleed easily.  Psychiatric/Behavioral: Negative.     Objective:  BP 120/70   Pulse 68   Temp 98.4 F (36.9 C) (Oral)    Resp 16   Ht _0  (1.753 m)   Wt 198 lb (89.8 kg)   SpO2 98%   BMI 29.24 kg/m   BP Readings from Last 3 Encounters:  03/18/20 120/70  03/18/20 120/70  09/17/19 138/64    Wt Readings from Last 3 Encounters:  03/18/20 198 lb (89.8 kg)  03/18/20 198 lb 3.2 oz (89.9 kg)  09/17/19 199 lb (90.3 kg)    Physical Exam Vitals reviewed.  Constitutional:      Appearance: Normal appearance.  HENT:     Nose: Nose normal.     Mouth/Throat:     Mouth: Mucous membranes are moist.  Eyes:     General: No scleral icterus.    Conjunctiva/sclera: Conjunctivae normal.  Cardiovascular:     Rate and Rhythm: Normal rate and regular rhythm.  Pulmonary:     Effort: Pulmonary effort is normal.     Breath sounds: Examination of the right-upper field reveals decreased breath sounds and rhonchi. Examination of the left-upper field reveals decreased breath sounds and rhonchi. Examination of the right-middle field reveals decreased breath sounds and rhonchi. Examination of the left-middle field reveals decreased breath sounds and rhonchi. Examination of the right-lower field reveals decreased breath sounds and rhonchi. Examination of the left-lower field reveals decreased breath sounds and rhonchi. Decreased breath sounds and rhonchi present. No wheezing or rales.  Abdominal:     General: Abdomen is flat.     Tenderness: There is no abdominal tenderness. There is no guarding.     Hernia: No hernia is present.  Musculoskeletal:        General: Normal range of motion.     Cervical back: Neck supple.     Right lower leg: Edema (trace pitting) present.     Left lower leg: Edema (trace pitting) present.  Lymphadenopathy:     Cervical: No cervical adenopathy.  Skin:    General: Skin is warm and dry.  Neurological:     General: No focal deficit present.     Mental Status: He is alert.  Psychiatric:        Mood and Affect: Mood normal.        Behavior: Behavior normal.     Lab Results  Component  Value Date   WBC 11.0 (H) 03/18/2020   HGB 12.7 (L) 03/18/2020   HCT 39.1 03/18/2020   PLT 242 03/18/2020   GLUCOSE 56 (L) 03/18/2020   CHOL 83 09/17/2019   TRIG 136.0 09/17/2019   HDL 36.40 (L) 09/17/2019   LDLCALC 19 09/17/2019   ALT 13 09/17/2019   AST 17 09/17/2019   NA 142 03/18/2020   K 4.4 03/18/2020   CL 106 03/18/2020   CREATININE 1.47 (H) 03/18/2020   BUN 21 03/18/2020   CO2 27 03/18/2020   TSH 4.25 09/17/2019   PSA 11.21 (H) 10/30/2017   INR 1.3 ratio (H) 08/11/2010   HGBA1C 6.1 (H) 03/18/2020   MICROALBUR 0.7 09/17/2019    DG Chest 2 View  Result  Date: 05/16/2019 CLINICAL DATA:  Cough and wheezing for 2 days. EXAM: CHEST - 2 VIEW COMPARISON:  02/05/2016. FINDINGS: Cardiac silhouette is normal in size. No mediastinal or hilar masses. No evidence of adenopathy. Clear lungs.  No pleural effusion or pneumothorax. Skeletal structures are intact. IMPRESSION: No active cardiopulmonary disease. Electronically Signed   By: Lajean Manes M.D.   On: 05/16/2019 14:38    Assessment & Plan:   Bradford was seen today for cough and diabetes.  Diagnoses and all orders for this visit:  Essential hypertension- His blood pressure is adequately well controlled. -     Basic metabolic panel; Future -     Basic metabolic panel  Type II diabetes mellitus with manifestations ([place])- His A1c is down to 6.1%.  I recommended that he discontinue the basal insulin, Metformin, and DPP 4 inhibitor. -     Basic metabolic panel; Future -     Hemoglobin A1c; Future -     Hemoglobin A1c -     Basic metabolic panel  CRI (chronic renal insufficiency), stage 3 (moderate)- His renal function is stable.  He will avoid nephrotoxic agents.  Will discontinue Metformin.  His blood pressure and blood sugar are adequately well controlled. -     Basic metabolic panel; Future -     Basic metabolic panel  S85 deficiency- His H&H have improved and his folate level is normal.  We will continue parenteral B12  replacement therapy. -     CBC with Differential/Platelet; Future -     Vitamin B12; Future -     Folate; Future -     Folate -     Vitamin B12 -     CBC with Differential/Platelet  OAB (overactive bladder)- I have asked him to treat this with a beta agonist. -     Vibegron (GEMTESA) 75 MG TABS; Take 1 tablet by mouth daily.  COPD with asthma ([place])- I think he would benefit from a nebulized LAMA. -     revefenacin (YUPELRI) 175 MCG/3ML nebulizer solution; Take 3 mLs (175 mcg total) by nebulization daily.   I have discontinued [name]. [place]'s budesonide, Lantus SoloStar, and Kombiglyze XR. I am also having him start on Gemtesa and revefenacin. Additionally, I am having him maintain his PreserVision/Lutein, FreeStyle Freedom Lite, freestyle, cyanocobalamin, cholecalciferol, aspirin EC, losartan, metoprolol tartrate, torsemide, Colesevelam HCl, B-D ULTRAFINE III [place] PEN, atorvastatin, FREESTYLE LITE, and Vasculera.  Meds ordered this encounter  Medications  . Vibegron (GEMTESA) 75 MG TABS    Sig: Take 1 tablet by mouth daily.    Dispense:  90 tablet    Refill:  1  . revefenacin (YUPELRI) 175 MCG/3ML nebulizer solution    Sig: Take 3 mLs (175 mcg total) by nebulization daily.    Dispense:  270 mL    Refill:  1     Follow-up: Return in about 6 months (around 09/18/2020).  Scarlette Calico, MD

## 2020-03-18 NOTE — Progress Notes (Signed)
Subjective:   Nicholas Burton is a 86 y.o. male who presents for Medicare Annual/Subsequent preventive examination.  Review of Systems    No ROS. Medicare Wellness Visit Cardiac Risk Factors include: advanced age (>51mn, >>59women);diabetes mellitus;dyslipidemia;family history of premature cardiovascular disease;hypertension;male gender     Objective:    Today's Vitals   03/18/20 1314  BP: 120/70  Pulse: 68  Resp: 16  Temp: 98.4 F (36.9 C)  SpO2: 97%  Weight: 198 lb 3.2 oz (89.9 kg)  Height: 5' 9" (1.753 m)  PainSc: 0-No pain   Body mass index is 29.27 kg/m.  Advanced Directives 03/18/2020 02/19/2019 12/22/2017 12/19/2016 02/16/2016  Does Patient Have a Medical Advance Directive? Yes No No No No  Type of Advance Directive Living will;Healthcare Power of Attorney - - - -  Does patient want to make changes to medical advance directive? No - Patient declined - - - -  [place] of [place]? No - copy requested - - - -  Would patient like information on creating a medical advance directive? - No - Patient declined Yes (ED - Information included in AVS) Yes (MAU/Ambulatory/Procedural Areas - Information given) Yes - Educational materials given    Current Medications (verified) Outpatient Encounter Medications as of 03/18/2020  Medication Sig  . aspirin EC 81 MG tablet Take 81 mg by mouth daily.  .Marland Kitchenatorvastatin (LIPITOR) 10 MG tablet TAKE 1 TABLET DAILY  . Blood Glucose Monitoring Suppl (FREESTYLE FREEDOM LITE) W/DEVICE KIT Use to check blood sugars daily Dx E11.8  . budesonide (PULMICORT) 0.5 MG/2ML nebulizer solution Take 2 mLs (0.5 mg total) by nebulization 2 (two) times daily.  . cholecalciferol (VITAMIN D) 1000 units tablet Take 1,000 Units by mouth daily.  . Colesevelam HCl 3.75 g PACK MIX THE CONTENTS OF 1 PACKET IN FLUID AND DRINK DAILY  . cyanocobalamin 1000 MCG tablet Take 1,000 mcg by mouth daily.  . Dietary Management Product (VASCULERA) TABS  Take 1 tablet by mouth daily.  .Marland Kitchenglucose blood (FREESTYLE LITE) test strip USE AS INSTRUCTED TO CHECK BLOOD SUGAR TWICE A DAY  . insulin glargine (LANTUS SOLOSTAR) 100 UNIT/ML Solostar Pen Inject 30 Units into the skin daily.  . Insulin Pen Needle (B-D ULTRAFINE III [place] PEN) 31G X 8 MM MISC Use to inject insulin daily. DX: E11.9  . KOMBIGLYZE XR 2.12-998 MG TB24 TAKE 2 TABLETS DAILY  . Lancets (FREESTYLE) lancets Use to check blood sugars twice day Dx E11.8  . losartan (COZAAR) 25 MG tablet Take 1 tablet (25 mg total) by mouth daily.  . metoprolol tartrate (LOPRESSOR) 25 MG tablet Take 0.5 tablets (12.5 mg total) by mouth 2 (two) times daily.  . Multiple Vitamins-Minerals (PRESERVISION/LUTEIN) CAPS Take 1 capsule by mouth 2 (two) times daily.    .Marland Kitchentorsemide (DEMADEX) 10 MG tablet TAKE 1 TABLET DAILY  . [DISCONTINUED] albuterol (PROVENTIL) (2.5 MG/3ML) 0.083% nebulizer solution Take 3 mLs by nebulization every 6 hours and as needed for wheezing or shortness of breath. (Patient not taking: Reported on 03/18/2020)  . [DISCONTINUED] atorvastatin (LIPITOR) 10 MG tablet TAKE 1 TABLET DAILY  . [DISCONTINUED] ferrous sulfate 325 (65 FE) MG tablet Take 1 tablet (325 mg total) by mouth 2 (two) times daily with a meal. (Patient not taking: Reported on 09/17/2019)  . [DISCONTINUED] glucose blood (FREESTYLE LITE) test strip Use as instructed to check blood sugar bid. DX: E11.9   No facility-administered encounter medications on file as of 03/18/2020.  Allergies (verified) Ace inhibitors, Oxycodone-acetaminophen, and Penicillins   History: Past Medical History:  Diagnosis Date  . Allergy   . ANEMIA ASSOCIATED W/OTHER [facility] NUTRITIONAL DEFIC 08/11/2010   Qualifier: Diagnosis of  By: Ronnald Ramp MD, [name].   . Cancer ([place])    skin  . Cataract   . Chronic combined systolic and diastolic congestive heart failure ([place]) 10/30/2018  . COPD with asthma ([place]) 03/02/2017  . CRI (chronic renal insufficiency), stage  3 (moderate) 10/25/2018  . Deficiency anemia 10/25/2018  . Diabetes mellitus    type 2  . DOE (dyspnea on exertion) 09/03/2018  . Edema 06/11/2009   Qualifier: Diagnosis of  By: Ronnald Ramp MD, [name].   . History of skin cancer   . Hyperlipidemia with target LDL less than 100 11/10/2008       . Hypertension   . Memory loss 05/14/2009  . Obesity (BMI 30.0-34.9) 04/01/2013  . Osteoarthritis   . PSA elevation 10/25/2018  . SKIN CANCER, HX OF 11/10/2008   Qualifier: Diagnosis of  By: Ronnald Ramp MD, [name].   . Snoring 04/03/2014  . Type II diabetes mellitus with manifestations ([place]) 11/10/2008   Estimated Creatinine Clearance: 38 mL/min (A) (by C-G formula based on SCr of 1.52 mg/dL (H)).   . Venous stasis dermatitis of both lower extremities 01/29/2018  . Vitamin D deficiency 11/10/2008   Past Surgical History:  Procedure Laterality Date  . CHOLECYSTECTOMY    . INGUINAL HERNIA REPAIR     x 3  . JOINT REPLACEMENT    . KNEE SURGERY     x 2  . MELANOMA EXCISION     x 3 -- Left arm  . TOTAL KNEE ARTHROPLASTY     x 3   Family History  Problem Relation Age of Onset  . Heart disease Mother   . Diabetes Mother   . Heart disease Father   . Cancer Sister   . Cancer Brother   . Diabetes Son   . Diabetes Other    Social History   Socioeconomic History  . Marital status: Married    Spouse name: Not on file  . Number of children: 4  . Years of education: Not on file  . Highest education level: Not on file  Occupational History  . Occupation: retired    Fish farm manager: RETIRED  Tobacco Use  . Smoking status: Never Smoker  . Smokeless tobacco: Never Used  Vaping Use  . Vaping Use: Never used  Substance and Sexual Activity  . Alcohol use: No  . Drug use: No  . Sexual activity: Yes    Birth control/protection: None  Other Topics Concern  . Not on file  Social History Narrative   No regular exercise   Social Determinants of Health   Financial Resource Strain: Low Risk   . Difficulty of  Paying Living Expenses: Not hard at all  Food Insecurity: No Food Insecurity  . Worried About Charity fundraiser in the Last Year: Never true  . Ran Out of Food in the Last Year: Never true  Transportation Needs: No Transportation Needs  . Lack of Transportation (Medical): No  . Lack of Transportation (Non-Medical): No  Physical Activity: Sufficiently Active  . Days of Exercise per Week: 5 days  . Minutes of Exercise per Session: 30 min  Stress: No Stress Concern Present  . Feeling of Stress : Not at all  Social Connections: Socially Integrated  . Frequency of Communication with Friends and Family: More  than three times a week  . Frequency of Social Gatherings with Friends and Family: More than three times a week  . Attends Religious Services: More than 4 times per year  . Active Member of Clubs or Organizations: Yes  . Attends Archivist Meetings: More than 4 times per year  . Marital Status: Married    Tobacco Counseling Counseling given: No   Clinical Intake:  Pre-visit preparation completed: Yes  Pain : No/denies pain Pain Score: 0-No pain     BMI - recorded: 29.27 Nutritional Status: BMI 25 -29 Overweight Nutritional Risks: Other (Comment) (no appetite) Diabetes: Yes CBG done?: No Did pt. bring in CBG monitor from home?: No  How often do you need to have someone help you when you read instructions, pamphlets, or other written materials from your doctor or pharmacy?: 1 - Never What is the last grade level you completed in school?: HS[employer] and some Community College Courses  Diabetic? yes  Interpreter Needed?: No  Information entered by :: Amoni Morales N. Ladislav Caselli, LPN   Activities of Daily Living In your present state of health, do you have any difficulty performing the following activities: 03/18/2020  Hearing? Y  Comment wears hearing aids  Vision? N  Difficulty concentrating or making decisions? Y  Walking or climbing stairs? N  Dressing or bathing? N   Doing errands, shopping? Y  Comment Does not drive anymore was advised by physician  Preparing Food and eating ? N  Using the Toilet? N  In the past six months, have you accidently leaked urine? N  Do you have problems with loss of bowel control? N  Managing your Medications? N  Managing your Finances? N  Housekeeping or managing your Housekeeping? N  Some recent data might be hidden    Patient Care Team: [name], MD as PCP - General  Indicate any recent Medical Services you may have received from other than [facility] providers in the past year (date may be approximate).     Assessment:   This is a routine wellness examination for [name].  Hearing/Vision screen No exam data present  Dietary issues and exercise activities discussed: Current Exercise Habits: Home exercise routine, Time (Minutes): 30, Frequency (Times/Week): 5, Weekly Exercise (Minutes/Week): 150, Intensity: Mild, Exercise limited by: respiratory conditions(s);orthopedic condition(s)  Goals    .  Continue to be active as possible and enjoy  my family and life.    .  Patient Stated      Maintain current health status, stay as healthy and as independent as possible. Enjoy life and family.    .  Patient Stated (pt-stated)      To live to see next year      Depression Screen PHQ 2/9 Scores 03/18/2020 09/17/2019 02/19/2019 10/25/2018 12/22/2017 12/19/2016 12/19/2016  PHQ - 2 Score _0 0 0 0 0  PHQ- 9 Score 6 - - - 1 0 -    Fall Risk Fall Risk  03/18/2020 09/17/2019 02/19/2019 10/25/2018 01/29/2018  Falls in the past year? 0 0 0 0 No  Number falls in past yr: 0 0 0 0 -  Injury with Fall? 0 0 - 0 -  Risk for fall due to : No Fall Risks - Impaired balance/gait;Impaired mobility - -  Follow up Falls evaluation completed Falls evaluation completed - Falls evaluation completed -    Any stairs in or around the home? Yes  If so, are there any without handrails? No  Home free of  loose throw rugs in walkways, pet beds,  electrical cords, etc? Yes  Adequate lighting in your home to reduce risk of falls? Yes   ASSISTIVE DEVICES UTILIZED TO PREVENT FALLS:  Life alert? No  Use of a cane, walker or w/c? No  Grab bars in the bathroom? Yes  Shower chair or bench in shower? Yes  Elevated toilet seat or a handicapped toilet? Yes   TIMED UP AND GO:  Was the test performed? No .  Length of time to ambulate 10 feet: 0 sec.   Gait steady and fast without use of assistive device  Cognitive Function: MMSE - Mini Mental State Exam 12/22/2017 12/19/2016  Orientation to time 5 5  Orientation to Place 5 5  Registration 3 3  Attention/ Calculation 5 4  Recall 1 2  Language- name 2 objects 2 2  Language- repeat 1 1  Language- follow 3 step command 3 3  Language- read & follow direction 1 1  Write a sentence 1 1  Copy design 1 1  Total score 28 28     6CIT Screen 03/18/2020  What Year? 0 points  What month? 0 points  What time? 0 points  Count back from 20 0 points  Months in reverse 2 points  Repeat phrase 2 points  Total Score 4    Immunizations Immunization History  Administered Date(s) Administered  . Fluad Quad(high Dose 65+) 05/16/2019  . Influenza Split 05/24/2012  . Influenza Whole 08/30/2007, 05/12/2010  . Influenza, High Dose Seasonal PF 04/28/2015, 05/03/2016, 05/01/2018  . Influenza,inj,Quad PF,6+ Mos 06/03/2013, 05/20/2014, 06/30/2017  . Pneumococcal Conjugate-13 06/12/2014  . Pneumococcal Polysaccharide-23 08/29/2006, 04/01/2013, 09/17/2019  . Td 08/29/2002  . Tdap 04/01/2013  . Varicella 07/21/2008  . Zoster 07/16/2008  . Zoster Recombinat (Shingrix) 05/17/2018, 07/16/2018    TDAP status: Up to date Flu Vaccine status: Up to date Pneumococcal vaccine status: Up to date Covid-19 vaccine status: Completed vaccines  Qualifies for Shingles Vaccine? Yes   Zostavax completed Yes   Shingrix Completed?: Yes  Screening Tests Health Maintenance  Topic Date Due  . COVID-19 Vaccine  (1) Never done  . INFLUENZA VACCINE  03/29/2020  . OPHTHALMOLOGY EXAM  09/04/2020  . FOOT EXAM  09/16/2020  . TETANUS/TDAP  04/02/2023  . PNA vac Low Risk Adult  Completed    Health Maintenance  Health Maintenance Due  Topic Date Due  . COVID-19 Vaccine (1) Never done    Colorectal cancer screening: No longer required.   Lung Cancer Screening: (Low Dose CT Chest recommended if Age 38-80 years, 30 pack-year currently smoking OR have quit w/in 15years.) does not qualify.   Lung Cancer Screening Referral: no  Additional Screening:  Hepatitis C Screening: does not qualify; Completed no  Vision Screening: Recommended annual ophthalmology exams for early detection of glaucoma and other disorders of the eye. Is the patient up to date with their annual eye exam?  Yes  Who is the provider or what is the name of the office in which the patient attends annual eye exams? Howard McFarland, OD If pt is not established with a provider, would they like to be referred to a provider to establish care? No .   Dental Screening: Recommended annual dental exams for proper oral hygiene  Community Resource Referral / Chronic Care Management: CRR required this visit?  No   CCM required this visit?  No      Plan:     I have personally reviewed and noted the  following in the patient's chart:   . Medical and social history . Use of alcohol, tobacco or illicit drugs  . Current medications and supplements . Functional ability and status . Nutritional status . Physical activity . Advanced directives . List of other physicians . Hospitalizations, surgeries, and ER visits in previous 12 months . Vitals . Screenings to include cognitive, depression, and falls . Referrals and appointments  In addition, I have reviewed and discussed with patient certain preventive protocols, quality metrics, and best practice recommendations. A written personalized care plan for preventive services as well as  general preventive health recommendations were provided to patient.     Sheral Flow, LPN   7/68/1157   Nurse Notes: n/a

## 2020-03-18 NOTE — Patient Instructions (Signed)
Type 2 Diabetes Mellitus, Diagnosis, Adult Type 2 diabetes (type 2 diabetes mellitus) is a long-term (chronic) disease. In type 2 diabetes, one or both of these problems may be present:  The pancreas does not make enough of a hormone called insulin.  Cells in the body do not respond properly to insulin that the body makes (insulin resistance). Normally, insulin allows blood sugar (glucose) to enter cells in the body. The cells use glucose for energy. Insulin resistance or lack of insulin causes excess glucose to build up in the blood instead of going into cells. As a result, high blood glucose (hyperglycemia) develops. What increases the risk? The following factors may make you more likely to develop type 2 diabetes:  Having a family member with type 2 diabetes.  Being overweight or obese.  Having an inactive (sedentary) lifestyle.  Having been diagnosed with insulin resistance.  Having a history of prediabetes, gestational diabetes, or polycystic ovary syndrome (PCOS).  Being of American-Indian, African-American, Hispanic/Latino, or Asian/Pacific Islander descent. What are the signs or symptoms? In the early stage of this condition, you may not have symptoms. Symptoms develop slowly and may include:  Increased thirst (polydipsia).  Increased hunger(polyphagia).  Increased urination (polyuria).  Increased urination during the night (nocturia).  Unexplained weight loss.  Frequent infections that keep coming back (recurring).  Fatigue.  Weakness.  Vision changes, such as blurry vision.  Cuts or bruises that are slow to heal.  Tingling or numbness in the hands or feet.  Dark patches on the skin (acanthosis nigricans). How is this diagnosed? This condition is diagnosed based on your symptoms, your medical history, a physical exam, and your blood glucose level. Your blood glucose may be checked with one or more of the following blood tests:  A fasting blood glucose (FBG)  test. You will not be allowed to eat (you will fast) for 8 hours or longer before a blood sample is taken.  A random blood glucose test. This test checks blood glucose at any time of day regardless of when you ate.  An A1c (hemoglobin A1c) blood test. This test provides information about blood glucose control over the previous 2-3 months.  An oral glucose tolerance test (OGTT). This test measures your blood glucose at two times: ? After fasting. This is your baseline blood glucose level. ? Two hours after drinking a beverage that contains glucose. You may be diagnosed with type 2 diabetes if:  Your FBG level is 126 mg/dL (7.0 mmol/L) or higher.  Your random blood glucose level is 200 mg/dL (11.1 mmol/L) or higher.  Your A1c level is 6.5% or higher.  Your OGTT result is higher than 200 mg/dL (11.1 mmol/L). These blood tests may be repeated to confirm your diagnosis. How is this treated? Your treatment may be managed by a specialist called an endocrinologist. Type 2 diabetes may be treated by following instructions from your health care provider about:  Making diet and lifestyle changes. This may include: ? Following an individualized nutrition plan that is developed by a diet and nutrition specialist (registered dietitian). ? Exercising regularly. ? Finding ways to manage stress.  Checking your blood glucose level as often as told.  Taking diabetes medicines or insulin daily. This helps to keep your blood glucose levels in the healthy range. ? If you use insulin, you may need to adjust the dosage depending on how physically active you are and what foods you eat. Your health care provider will tell you how to adjust your dosage.    Taking medicines to help prevent complications from diabetes, such as: ? Aspirin. ? Medicine to lower cholesterol. ? Medicine to control blood pressure. Your health care provider will set individualized treatment goals for you. Your goals will be based on  your age, other medical conditions you have, and how you respond to diabetes treatment. Generally, the goal of treatment is to maintain the following blood glucose levels:  Before meals (preprandial): 80-130 mg/dL (4.4-7.2 mmol/L).  After meals (postprandial): below 180 mg/dL (10 mmol/L).  A1c level: less than 7%. Follow these instructions at home: Questions to ask your health care provider  Consider asking the following questions: ? Do I need to meet with a diabetes educator? ? Where can I find a support group for people with diabetes? ? What equipment will I need to manage my diabetes at home? ? What diabetes medicines do I need, and when should I take them? ? How often do I need to check my blood glucose? ? What number can I call if I have questions? ? When is my next appointment? General instructions  Take over-the-counter and prescription medicines only as told by your health care provider.  Keep all follow-up visits as told by your health care provider. This is important.  For more information about diabetes, visit: ? American Diabetes Association (ADA): www.diabetes.org ? American Association of Diabetes Educators (AADE): www.diabeteseducator.org Contact a health care provider if:  Your blood glucose is at or above 240 mg/dL (13.3 mmol/L) for 2 days in a row.  You have been sick or have had a fever for 2 days or longer, and you are not getting better.  You have any of the following problems for more than 6 hours: ? You cannot eat or drink. ? You have nausea and vomiting. ? You have diarrhea. Get help right away if:  Your blood glucose is lower than 54 mg/dL (3.0 mmol/L).  You become confused or you have trouble thinking clearly.  You have difficulty breathing.  You have moderate or large ketone levels in your urine. Summary  Type 2 diabetes (type 2 diabetes mellitus) is a long-term (chronic) disease. In type 2 diabetes, the pancreas does not make enough of a  hormone called insulin, or cells in the body do not respond properly to insulin that the body makes (insulin resistance).  This condition is treated by making diet and lifestyle changes and taking diabetes medicines or insulin.  Your health care provider will set individualized treatment goals for you. Your goals will be based on your age, other medical conditions you have, and how you respond to diabetes treatment.  Keep all follow-up visits as told by your health care provider. This is important. This information is not intended to replace advice given to you by your health care provider. Make sure you discuss any questions you have with your health care provider. Document Revised: 10/13/2017 Document Reviewed: 09/18/2015 Elsevier Patient Education  2020 Elsevier Inc.  

## 2020-03-18 NOTE — Patient Instructions (Addendum)
Nicholas Burton , Thank you for taking time to come for your Medicare Wellness Visit. I appreciate your ongoing commitment to your health goals. Please review the following plan we discussed and let me know if I can assist you in the future.   Screening recommendations/referrals: Colonoscopy: no repeat due to age Recommended yearly ophthalmology/optometry visit for glaucoma screening and checkup Recommended yearly dental visit for hygiene and checkup  Vaccinations: Influenza vaccine: 05/16/2019 Pneumococcal vaccine: completed Tdap vaccine: 04/01/2013 Shingles vaccine: completed   Covid-19: completed  Advanced directives: Please bring a copy of your health care power of attorney and living will to the office at your convenience.   Conditions/risks identified: Yes; Please continue to do your personal lifestyle choices by: daily care of teeth and gums, regular physical activity (goal should be 5 days a week for 30 minutes), eat a healthy diet, avoid tobacco and drug use, limiting any alcohol intake, taking a low-dose aspirin (if not allergic or have been advised by your provider otherwise) and taking vitamins and minerals as recommended by your provider. Continue doing brain stimulating activities (puzzles, reading, adult coloring books, staying active) to keep memory sharp. Continue to eat heart healthy diet (full of fruits, vegetables, whole grains, lean protein, water--limit salt, fat, and sugar intake) and increase physical activity as tolerated.  Next appointment: Please schedule your next Medicare Wellness Visit with your Nurse Health Advisor in 1 year.  Preventive Care 84 Years and Older, Male Preventive care refers to lifestyle choices and visits with your health care provider that can promote health and wellness. What does preventive care include?  A yearly physical exam. This is also called an annual well check.  Dental exams once or twice a year.  Routine eye exams. Ask your health care  provider how often you should have your eyes checked.  Personal lifestyle choices, including:  Daily care of your teeth and gums.  Regular physical activity.  Eating a healthy diet.  Avoiding tobacco and drug use.  Limiting alcohol use.  Practicing safe sex.  Taking low doses of aspirin every day.  Taking vitamin and mineral supplements as recommended by your health care provider. What happens during an annual well check? The services and screenings done by your health care provider during your annual well check will depend on your age, overall health, lifestyle risk factors, and family history of disease. Counseling  Your health care provider may ask you questions about your:  Alcohol use.  Tobacco use.  Drug use.  Emotional well-being.  Home and relationship well-being.  Sexual activity.  Eating habits.  History of falls.  Memory and ability to understand (cognition).  Work and work Statistician. Screening  You may have the following tests or measurements:  Height, weight, and BMI.  Blood pressure.  Lipid and cholesterol levels. These may be checked every 5 years, or more frequently if you are over 84 years old.  Skin check.  Lung cancer screening. You may have this screening every year starting at age 84 if you have a 30-pack-year history of smoking and currently smoke or have quit within the past 15 years.  Fecal occult blood test (FOBT) of the stool. You may have this test every year starting at age 50.  Flexible sigmoidoscopy or colonoscopy. You may have a sigmoidoscopy every 5 years or a colonoscopy every 10 years starting at age 88.  Prostate cancer screening. Recommendations will vary depending on your family history and other risks.  Hepatitis C blood test.  Hepatitis  B blood test.  Sexually transmitted disease (STD) testing.  Diabetes screening. This is done by checking your blood sugar (glucose) after you have not eaten for a while  (fasting). You may have this done every 1-3 years.  Abdominal aortic aneurysm (AAA) screening. You may need this if you are a current or former smoker.  Osteoporosis. You may be screened starting at age 28 if you are at high risk. Talk with your health care provider about your test results, treatment options, and if necessary, the need for more tests. Vaccines  Your health care provider may recommend certain vaccines, such as:  Influenza vaccine. This is recommended every year.  Tetanus, diphtheria, and acellular pertussis (Tdap, Td) vaccine. You may need a Td booster every 10 years.  Zoster vaccine. You may need this after age 54.  Pneumococcal 13-valent conjugate (PCV13) vaccine. One dose is recommended after age 32.  Pneumococcal polysaccharide (PPSV23) vaccine. One dose is recommended after age 4. Talk to your health care provider about which screenings and vaccines you need and how often you need them. This information is not intended to replace advice given to you by your health care provider. Make sure you discuss any questions you have with your health care provider. Document Released: 09/11/2015 Document Revised: 05/04/2016 Document Reviewed: 06/16/2015 Elsevier Interactive Patient Education  2017 [place] Prevention in the Home Falls can cause injuries. They can happen to people of all ages. There are many things you can do to make your home safe and to help prevent falls. What can I do on the outside of my home?  Regularly fix the edges of walkways and driveways and fix any cracks.  Remove anything that might make you trip as you walk through a door, such as a raised step or threshold.  Trim any bushes or trees on the path to your home.  Use bright outdoor lighting.  Clear any walking paths of anything that might make someone trip, such as rocks or tools.  Regularly check to see if handrails are loose or broken. Make sure that both sides of any steps have  handrails.  Any raised decks and porches should have guardrails on the edges.  Have any leaves, snow, or ice cleared regularly.  Use sand or salt on walking paths during winter.  Clean up any spills in your garage right away. This includes oil or grease spills. What can I do in the bathroom?  Use night lights.  Install grab bars by the toilet and in the tub and shower. Do not use towel bars as grab bars.  Use non-skid mats or decals in the tub or shower.  If you need to sit down in the shower, use a plastic, non-slip stool.  Keep the floor dry. Clean up any water that spills on the floor as soon as it happens.  Remove soap buildup in the tub or shower regularly.  Attach bath mats securely with double-sided non-slip rug tape.  Do not have throw rugs and other things on the floor that can make you trip. What can I do in the bedroom?  Use night lights.  Make sure that you have a light by your bed that is easy to reach.  Do not use any sheets or blankets that are too big for your bed. They should not hang down onto the floor.  Have a firm chair that has side arms. You can use this for support while you get dressed.  Do not have  throw rugs and other things on the floor that can make you trip. What can I do in the kitchen?  Clean up any spills right away.  Avoid walking on wet floors.  Keep items that you use a lot in easy-to-reach places.  If you need to reach something above you, use a strong step stool that has a grab bar.  Keep electrical cords out of the way.  Do not use floor polish or wax that makes floors slippery. If you must use wax, use non-skid floor wax.  Do not have throw rugs and other things on the floor that can make you trip. What can I do with my stairs?  Do not leave any items on the stairs.  Make sure that there are handrails on both sides of the stairs and use them. Fix handrails that are broken or loose. Make sure that handrails are as long as  the stairways.  Check any carpeting to make sure that it is firmly attached to the stairs. Fix any carpet that is loose or worn.  Avoid having throw rugs at the top or bottom of the stairs. If you do have throw rugs, attach them to the floor with carpet tape.  Make sure that you have a light switch at the top of the stairs and the bottom of the stairs. If you do not have them, ask someone to add them for you. What else can I do to help prevent falls?  Wear shoes that:  Do not have high heels.  Have rubber bottoms.  Are comfortable and fit you well.  Are closed at the toe. Do not wear sandals.  If you use a stepladder:  Make sure that it is fully opened. Do not climb a closed stepladder.  Make sure that both sides of the stepladder are locked into place.  Ask someone to hold it for you, if possible.  Clearly [place] and make sure that you can see:  Any grab bars or handrails.  First and last steps.  Where the edge of each step is.  Use tools that help you move around (mobility aids) if they are needed. These include:  Canes.  Walkers.  Scooters.  Crutches.  Turn on the lights when you go into a dark area. Replace any light bulbs as soon as they burn out.  Set up your furniture so you have a clear path. Avoid moving your furniture around.  If any of your floors are uneven, fix them.  If there are any pets around you, be aware of where they are.  Review your medicines with your doctor. Some medicines can make you feel dizzy. This can increase your chance of falling. Ask your doctor what other things that you can do to help prevent falls. This information is not intended to replace advice given to you by your health care provider. Make sure you discuss any questions you have with your health care provider. Document Released: 06/11/2009 Document Revised: 01/21/2016 Document Reviewed: 09/19/2014 Elsevier Interactive Patient Education  2017 [employer].

## 2020-03-19 LAB — CBC WITH DIFFERENTIAL/PLATELET
Absolute Monocytes: 990 cells/uL — ABNORMAL HIGH (ref 200–950)
Basophils Absolute: 44 cells/uL (ref 0–200)
Basophils Relative: 0.4 %
Eosinophils Absolute: 385 cells/uL (ref 15–500)
Eosinophils Relative: 3.5 %
HCT: 39.1 % (ref 38.5–50.0)
Hemoglobin: 12.7 g/dL — ABNORMAL LOW (ref 13.2–17.1)
Lymphs Abs: 3333 cells/uL (ref 850–3900)
MCH: 28.6 pg (ref 27.0–33.0)
MCHC: 32.5 g/dL (ref 32.0–36.0)
MCV: 88.1 fL (ref 80.0–100.0)
MPV: 10.7 fL (ref 7.5–12.5)
Monocytes Relative: 9 %
Neutro Abs: 6248 cells/uL (ref 1500–7800)
Neutrophils Relative %: 56.8 %
Platelets: 242 10*3/uL (ref 140–400)
RBC: 4.44 10*6/uL (ref 4.20–5.80)
RDW: 13.4 % (ref 11.0–15.0)
Total Lymphocyte: 30.3 %
WBC: 11 10*3/uL — ABNORMAL HIGH (ref 3.8–10.8)

## 2020-03-19 LAB — BASIC METABOLIC PANEL
BUN/Creatinine Ratio: 14 (calc) (ref 6–22)
BUN: 21 mg/dL (ref 7–25)
CO2: 27 mmol/L (ref 20–32)
Calcium: 8.9 mg/dL (ref 8.6–10.3)
Chloride: 106 mmol/L (ref 98–110)
Creat: 1.47 mg/dL — ABNORMAL HIGH (ref 0.70–1.11)
Glucose, Bld: 56 mg/dL — ABNORMAL LOW (ref 65–99)
Potassium: 4.4 mmol/L (ref 3.5–5.3)
Sodium: 142 mmol/L (ref 135–146)

## 2020-03-19 LAB — VITAMIN B12: Vitamin B-12: 660 pg/mL (ref 200–1100)

## 2020-03-19 LAB — HEMOGLOBIN A1C
Hgb A1c MFr Bld: 6.1 % of total Hgb — ABNORMAL HIGH (ref <5.7)
Mean Plasma Glucose: 128 (calc)
eAG (mmol/L): 7.1 (calc)

## 2020-03-19 LAB — FOLATE: Folate: 11.7 ng/mL

## 2020-03-19 MED ORDER — REVEFENACIN 175 MCG/3ML IN SOLN: 175.0000 ug | Freq: Every day | RESPIRATORY_TRACT | 1 refills | Status: DC

## 2020-03-19 MED ORDER — GEMTESA 75 MG PO TABS: 1.0000 | ORAL_TABLET | Freq: Every day | ORAL | 1 refills | Status: DC

## 2020-03-19 NOTE — Progress Notes (Signed)
Please let him know that his A1c is down to 6.1%.  I recommend that he stop using the insulin.  His anemia has improved significantly.  I do not think he needs an iron supplement but asked him to continue taking the current vitamin supplements that he is taking.  Ask him again if he wants to try a new inhaler for his lungs or a medication for overactive bladder.  Nicholas Burton

## 2020-03-23 ENCOUNTER — Telehealth: Payer: Self-pay

## 2020-03-23 NOTE — Telephone Encounter (Signed)
Key: [id number]

## 2020-03-24 NOTE — Telephone Encounter (Signed)
New message    Wife calling needs clarification on medication change - medication name was not mentioned.   Asking for a callback

## 2020-03-25 NOTE — Telephone Encounter (Signed)
Contact pt spouse and informed of medications (new ones are [place]). Explained what the medication are for and that the [place] has been approved. We reviewed the labs again with the recommendation of other medication changes: stop the iron supplement and insulin. Continued taking the vitamin supplements.

## 2020-04-19 NOTE — Progress Notes (Signed)
Cardiology Office Note   Date:  04/19/2020   ID:  Nicholas Burton, DOB 12-05-1927, MRN [id number]  PCP:  [name], MD    No chief complaint on file.  Chronic combined heart failure  Wt Readings from Last 3 Encounters:  03/18/20 198 lb (89.8 kg)  03/18/20 198 lb 3.2 oz (89.9 kg)  09/17/19 199 lb (90.3 kg)       History of Present Illness: Nicholas Burton is a 86 y.o. 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 Retired many years ago. His daughter-in-law works in our office at [facility]  In 2020, hewas working around the house and had more DOE. No CP.   He had an echo showing EF 35%.  He was started on a diuretic.He did well with a diuretic.  I then had a telephone visit with him in 03/2019 and an ARB was started. We discussed cardiac catheterization versus stress testing versus medical therapy. Given his lack of symptoms, we opted for no testing. The patient and his wife were nervous about coming into the office for any testing regardless. Home health came out and did some blood work showing stable creatinine. They showed a stable blood pressure and heart rate in the 90s. The plan was then going to be to add a beta-blocker. He did have some mild renal insufficiency which may interfere with uptitrating ARB.  Echo 03/2019:  1. The left ventricle has normal systolic function with an ejection fraction of 60-65%. The cavity size was normal. There is mildly increased left ventricular wall thickness. Left ventricular diastolic Doppler parameters are consistent with impaired  relaxation. 2. The right ventricle has normal systolic function. The cavity was normal. 3. The mitral valve is grossly normal. 4. The tricuspid valve is grossly normal. 5. The aortic valve is tricuspid. Mild thickening of the aortic valve. No stenosis of the aortic valve. 6. The aorta is abnormal in size and structure. 7. There is  mild dilatation of the ascending aorta measuring 41 mm. 8. Mild to moderate global reduction in LV systolic function; mild diastolic dysfunction; mildly dilated ascending aorta.   Doing well.  Denies : Chest pain. Dizziness. Nitroglycerin use. Orthopnea. Palpitations. Paroxysmal nocturnal dyspnea. Shortness of breath. Syncope.   Sleeps on one pillow.    Past Medical History:  Diagnosis Date  . Allergy   . ANEMIA ASSOCIATED W/OTHER [facility] NUTRITIONAL DEFIC 08/11/2010   Qualifier: Diagnosis of  By: Ronnald Ramp MD, [name].   . Cancer ([place])    skin  . Cataract   . Chronic combined systolic and diastolic congestive heart failure ([place]) 10/30/2018  . COPD with asthma ([place]) 03/02/2017  . CRI (chronic renal insufficiency), stage 3 (moderate) 10/25/2018  . Deficiency anemia 10/25/2018  . Diabetes mellitus    type 2  . DOE (dyspnea on exertion) 09/03/2018  . Edema 06/11/2009   Qualifier: Diagnosis of  By: Ronnald Ramp MD, [name].   . History of skin cancer   . Hyperlipidemia with target LDL less than 100 11/10/2008       . Hypertension   . Memory loss 05/14/2009  . Obesity (BMI 30.0-34.9) 04/01/2013  . Osteoarthritis   . PSA elevation 10/25/2018  . SKIN CANCER, HX OF 11/10/2008   Qualifier: Diagnosis of  By: Ronnald Ramp MD, [name].   . Snoring 04/03/2014  . Type II diabetes mellitus with manifestations ([place]) 11/10/2008   Estimated Creatinine Clearance: 38 mL/min (A) (  by C-G formula based on SCr of 1.52 mg/dL (H)).   . Venous stasis dermatitis of both lower extremities 01/29/2018  . Vitamin D deficiency 11/10/2008    Past Surgical History:  Procedure Laterality Date  . CHOLECYSTECTOMY    . INGUINAL HERNIA REPAIR     x 3  . JOINT REPLACEMENT    . KNEE SURGERY     x 2  . MELANOMA EXCISION     x 3 -- Left arm  . TOTAL KNEE ARTHROPLASTY     x 3     Current Outpatient Medications  Medication Sig Dispense Refill  . aspirin EC 81 MG tablet Take 81 mg by mouth daily.    Marland Kitchen atorvastatin (LIPITOR) 10 MG tablet  TAKE 1 TABLET DAILY 90 tablet 1  . Blood Glucose Monitoring Suppl (FREESTYLE FREEDOM LITE) W/DEVICE KIT Use to check blood sugars daily Dx E11.8 1 each 0  . cholecalciferol (VITAMIN D) 1000 units tablet Take 1,000 Units by mouth daily.    . Colesevelam HCl 3.75 g PACK MIX THE CONTENTS OF 1 PACKET IN FLUID AND DRINK DAILY 90 each 1  . cyanocobalamin 1000 MCG tablet Take 1,000 mcg by mouth daily.    . Dietary Management Product (VASCULERA) TABS Take 1 tablet by mouth daily. 30 tablet 11  . glucose blood (FREESTYLE LITE) test strip USE AS INSTRUCTED TO CHECK BLOOD SUGAR TWICE A DAY 200 each 3  . Insulin Pen Needle (B-D ULTRAFINE III Yehle PEN) 31G X 8 MM MISC Use to inject insulin daily. DX: E11.9 300 each 3  . Lancets (FREESTYLE) lancets Use to check blood sugars twice day Dx E11.8 180 each 3  . losartan (COZAAR) 25 MG tablet Take 1 tablet (25 mg total) by mouth daily. 90 tablet 3  . metoprolol tartrate (LOPRESSOR) 25 MG tablet Take 0.5 tablets (12.5 mg total) by mouth 2 (two) times daily. 90 tablet 3  . Multiple Vitamins-Minerals (PRESERVISION/LUTEIN) CAPS Take 1 capsule by mouth 2 (two) times daily.      . revefenacin (YUPELRI) 175 MCG/3ML nebulizer solution Take 3 mLs (175 mcg total) by nebulization daily. 270 mL 1  . torsemide (DEMADEX) 10 MG tablet TAKE 1 TABLET DAILY 90 tablet 1  . Vibegron (GEMTESA) 75 MG TABS Take 1 tablet by mouth daily. 90 tablet 1   No current facility-administered medications for this visit.    Allergies:   Ace inhibitors, Oxycodone-acetaminophen, and Penicillins    Social History:  The patient  reports that he has never smoked. He has never used smokeless tobacco. He reports that he does not drink alcohol and does not use drugs.   Family History:  The patient's family history includes Cancer in his brother and sister; Diabetes in his mother, son, and another family member; Heart disease in his father and mother.    ROS:  Please see the history of present  illness.   Otherwise, review of systems are positive for leg swelling.   All other systems are reviewed and negative.    PHYSICAL EXAM: VS:  There were no vitals taken for this visit. , BMI There is no height or weight on file to calculate BMI. GEN: Well nourished, well developed, in no acute distress  HEENT: normal  Neck: no JVD, carotid bruits, or masses Cardiac: RRR; no murmurs, rubs, or gallops,; bilateral 1+ pitting edema  Respiratory:  clear to auscultation bilaterally, normal work of breathing GI: soft, nontender, nondistended, + BS MS: no deformity or atrophy  Skin:  warm and dry, no rash Neuro:  Strength and sensation are intact Psych: euthymic mood, full affect   EKG:   The ekg ordered today demonstrates NSR, no ST changes   Recent Labs: 09/17/2019: ALT 13; TSH 4.25 03/18/2020: BUN 21; Creat 1.47; Hemoglobin 12.7; Platelets 242; Potassium 4.4; Sodium 142   Lipid Panel    Component Value Date/Time   CHOL 83 09/17/2019 1028   TRIG 136.0 09/17/2019 1028   HDL 36.40 (L) 09/17/2019 1028   CHOLHDL 2 09/17/2019 1028   VLDL 27.2 09/17/2019 1028   LDLCALC 19 09/17/2019 1028     Other studies Reviewed: Additional studies/ records that were reviewed today with results demonstrating: labs reviewed.   ASSESSMENT AND PLAN:  1. Chronic combined heart failure: LVEF improved on the repeat echo in 03/2019.   2. CRI: Cr 1.47 in 7/21. Avoid nephrotoxins. BP stable.   3. LE edema- elevate legs.  Continue diuretic.  Low salt.   4. HTN: The current medical regimen is effective;  continue present plan and medications. 5. Type 2 DM: A1C 6.1 in 7/21.  He did add some insulin back.  I asked him to let Dr. Ronnald Ramp.  Watch for low blood sugars.  Well controlled, per his report.    Current medicines are reviewed at length with the patient today.  The patient concerns regarding his medicines were addressed.  The following changes have been made:  No change  Labs/ tests ordered today  include:  No orders of the defined types were placed in this encounter.   Recommend 150 minutes/week of aerobic exercise Low fat, low carb, high fiber diet recommended  Disposition:   FU in 1 year   Signed, Larae Grooms, MD  04/19/2020 11:09 AM    [facility] [address], [place], [place]  [address] Phone: [phone]; Fax: [phone]

## 2020-04-20 ENCOUNTER — Telehealth: Payer: Self-pay | Admitting: Internal Medicine

## 2020-04-20 NOTE — Telephone Encounter (Signed)
New Message:   Pt's spouse is calling and states she has some questions about the pt using yupelri with this with his nebulizer. Pt wants to know how long the Dr wants him to stay on this medication. Please advise.

## 2020-04-21 ENCOUNTER — Encounter: Payer: Self-pay | Admitting: Interventional Cardiology

## 2020-04-21 ENCOUNTER — Other Ambulatory Visit: Payer: Self-pay

## 2020-04-21 ENCOUNTER — Ambulatory Visit (INDEPENDENT_AMBULATORY_CARE_PROVIDER_SITE_OTHER): Payer: Medicare Other | Admitting: Interventional Cardiology

## 2020-04-21 VITALS — BP 128/66 | HR 71 | Ht 69.0 in | Wt 198.2 lb

## 2020-04-21 DIAGNOSIS — I10 Essential (primary) hypertension: Secondary | ICD-10-CM | POA: Diagnosis not present

## 2020-04-21 DIAGNOSIS — E118 Type 2 diabetes mellitus with unspecified complications: Secondary | ICD-10-CM

## 2020-04-21 DIAGNOSIS — N183 Chronic kidney disease, stage 3 unspecified: Secondary | ICD-10-CM | POA: Diagnosis not present

## 2020-04-21 DIAGNOSIS — I5042 Chronic combined systolic (congestive) and diastolic (congestive) heart failure: Secondary | ICD-10-CM | POA: Diagnosis not present

## 2020-04-21 NOTE — Patient Instructions (Signed)
Medication Instructions:  Your physician recommends that you continue on your current medications as directed. Please refer to the Current Medication list given to you today.  *If you need a refill on your cardiac medications before your next appointment, please call your pharmacy*   Lab Work: None  If you have labs (blood work) drawn today and your tests are completely normal, you will receive your results only by: . MyChart Message (if you have MyChart) OR . A paper copy in the mail If you have any lab test that is abnormal or we need to change your treatment, we will call you to review the results.   Testing/Procedures: None  Follow-Up: At [facility], you and your health needs are our priority.  As part of our continuing mission to provide you with exceptional heart care, we have created designated Provider Care Teams.  These Care Teams include your primary Cardiologist (physician) and Advanced Practice Providers (APPs -  Physician Assistants and Nurse Practitioners) who all work together to provide you with the care you need, when you need it.  We recommend signing up for the patient portal called "MyChart".  Sign up information is provided on this After Visit Summary.  MyChart is used to connect with patients for Virtual Visits (Telemedicine).  Patients are able to view lab/test results, encounter notes, upcoming appointments, etc.  Non-urgent messages can be sent to your provider as well.   To learn more about what you can do with MyChart, go to [email or web address].    Your next appointment:   12 month(s)  The format for your next appointment:   In Person  Provider:   You may see Jayadeep Varanasi, MD or one of the following Advanced Practice Providers on your designated Care Team:    Dayna Dunn, PA-C  Michele Lenze, PA-C    Other Instructions  High-Fiber Diet Fiber, also called dietary fiber, is a type of carbohydrate that is found in fruits, vegetables, whole  grains, and beans. A high-fiber diet can have many health benefits. Your health care provider may recommend a high-fiber diet to help:  Prevent constipation. Fiber can make your bowel movements more regular.  Lower your cholesterol.  Relieve the following conditions: ? Swelling of veins in the anus (hemorrhoids). ? Swelling and irritation (inflammation) of specific areas of the digestive tract (uncomplicated diverticulosis). ? A problem of the large intestine (colon) that sometimes causes pain and diarrhea (irritable bowel syndrome, IBS).  Prevent overeating as part of a weight-loss plan.  Prevent heart disease, type 2 diabetes, and certain cancers. What is my plan? The recommended daily fiber intake in grams (g) includes:  38 g for men age 50 or younger.  30 g for men over age 50.  25 g for women age 50 or younger.  21 g for women over age 50. You can get the recommended daily intake of dietary fiber by:  Eating a variety of fruits, vegetables, grains, and beans.  Taking a fiber supplement, if it is not possible to get enough fiber through your diet. What do I need to know about a high-fiber diet?  It is better to get fiber through food sources rather than from fiber supplements. There is not a lot of research about how effective supplements are.  Always check the fiber content on the nutrition facts label of any prepackaged food. Look for foods that contain 5 g of fiber or more per serving.  Talk with a diet and nutrition specialist (dietitian) if you   have questions about specific foods that are recommended or not recommended for your medical condition, especially if those foods are not listed below.  Gradually increase how much fiber you consume. If you increase your intake of dietary fiber too quickly, you may have bloating, cramping, or gas.  Drink plenty of water. Water helps you to digest fiber. What are tips for following this plan?  Eat a wide variety of high-fiber  foods.  Make sure that half of the grains that you eat each day are whole grains.  Eat breads and cereals that are made with whole-grain flour instead of refined flour or white flour.  Eat brown rice, bulgur wheat, or millet instead of white rice.  Start the day with a breakfast that is high in fiber, such as a cereal that contains 5 g of fiber or more per serving.  Use beans in place of meat in soups, salads, and pasta dishes.  Eat high-fiber snacks, such as berries, raw vegetables, nuts, and popcorn.  Choose whole fruits and vegetables instead of processed forms like juice or sauce. What foods can I eat?  Fruits Berries. Pears. Apples. Oranges. Avocado. Prunes and raisins. Dried figs. Vegetables Sweet potatoes. Spinach. Kale. Artichokes. Cabbage. Broccoli. Cauliflower. Green peas. Carrots. Squash. Grains Whole-grain breads. Multigrain cereal. Oats and oatmeal. Brown rice. Barley. Bulgur wheat. Millet. Quinoa. Bran muffins. Popcorn. Rye wafer crackers. Meats and other proteins Navy, kidney, and pinto beans. Soybeans. Split peas. Lentils. Nuts and seeds. Dairy Fiber-fortified yogurt. Beverages Fiber-fortified soy milk. Fiber-fortified orange juice. Other foods Fiber bars. The items listed above may not be a complete list of recommended foods and beverages. Contact a dietitian for more options. What foods are not recommended? Fruits Fruit juice. Cooked, strained fruit. Vegetables Fried potatoes. Canned vegetables. Well-cooked vegetables. Grains White bread. Pasta made with refined flour. White rice. Meats and other proteins Fatty cuts of meat. Fried chicken or fried fish. Dairy Milk. Yogurt. Cream cheese. Sour cream. Fats and oils Butters. Beverages Soft drinks. Other foods Cakes and pastries. The items listed above may not be a complete list of foods and beverages to avoid. Contact a dietitian for more information. Summary  Fiber is a type of carbohydrate. It is  found in fruits, vegetables, whole grains, and beans.  There are many health benefits of eating a high-fiber diet, such as preventing constipation, lowering blood cholesterol, helping with weight loss, and reducing your risk of heart disease, diabetes, and certain cancers.  Gradually increase your intake of fiber. Increasing too fast can result in cramping, bloating, and gas. Drink plenty of water while you increase your fiber.  The best sources of fiber include whole fruits and vegetables, whole grains, nuts, seeds, and beans. This information is not intended to replace advice given to you by your health care provider. Make sure you discuss any questions you have with your health care provider. Document Revised: 06/19/2017 Document Reviewed: 06/19/2017 Elsevier Patient Education  2020 Elsevier Inc.   

## 2020-04-21 NOTE — Telephone Encounter (Signed)
Pt spouse contacted and informed that [facility] has the refill and that Dr. Ronnald Ramp wants him to continue unless he is having issues with it. Mrs. Pautler states that he is doing well and will continue the medication as prescribed.   Reminded of follow up in 6 months

## 2020-04-28 DIAGNOSIS — H3540 Unspecified peripheral retinal degeneration: Secondary | ICD-10-CM | POA: Diagnosis not present

## 2020-04-28 DIAGNOSIS — H353223 Exudative age-related macular degeneration, left eye, with inactive scar: Secondary | ICD-10-CM | POA: Diagnosis not present

## 2020-04-28 DIAGNOSIS — H43813 Vitreous degeneration, bilateral: Secondary | ICD-10-CM | POA: Diagnosis not present

## 2020-04-28 DIAGNOSIS — H353212 Exudative age-related macular degeneration, right eye, with inactive choroidal neovascularization: Secondary | ICD-10-CM | POA: Diagnosis not present

## 2020-06-16 ENCOUNTER — Other Ambulatory Visit: Payer: Self-pay | Admitting: Interventional Cardiology

## 2020-06-16 ENCOUNTER — Other Ambulatory Visit: Payer: Self-pay | Admitting: Internal Medicine

## 2020-07-01 ENCOUNTER — Other Ambulatory Visit: Payer: Self-pay | Admitting: Internal Medicine

## 2020-07-01 DIAGNOSIS — Z23 Encounter for immunization: Secondary | ICD-10-CM | POA: Diagnosis not present

## 2020-07-01 DIAGNOSIS — J4541 Moderate persistent asthma with (acute) exacerbation: Secondary | ICD-10-CM

## 2020-08-14 DIAGNOSIS — H3540 Unspecified peripheral retinal degeneration: Secondary | ICD-10-CM | POA: Diagnosis not present

## 2020-08-14 DIAGNOSIS — H43813 Vitreous degeneration, bilateral: Secondary | ICD-10-CM | POA: Diagnosis not present

## 2020-08-14 DIAGNOSIS — H353211 Exudative age-related macular degeneration, right eye, with active choroidal neovascularization: Secondary | ICD-10-CM | POA: Diagnosis not present

## 2020-08-14 DIAGNOSIS — H353223 Exudative age-related macular degeneration, left eye, with inactive scar: Secondary | ICD-10-CM | POA: Diagnosis not present

## 2020-09-01 DIAGNOSIS — D229 Melanocytic nevi, unspecified: Secondary | ICD-10-CM | POA: Diagnosis not present

## 2020-09-01 DIAGNOSIS — Z85820 Personal history of malignant melanoma of skin: Secondary | ICD-10-CM | POA: Diagnosis not present

## 2020-09-01 DIAGNOSIS — L821 Other seborrheic keratosis: Secondary | ICD-10-CM | POA: Diagnosis not present

## 2020-09-01 DIAGNOSIS — L905 Scar conditions and fibrosis of skin: Secondary | ICD-10-CM | POA: Diagnosis not present

## 2020-09-01 DIAGNOSIS — L814 Other melanin hyperpigmentation: Secondary | ICD-10-CM | POA: Diagnosis not present

## 2020-09-01 DIAGNOSIS — D485 Neoplasm of uncertain behavior of skin: Secondary | ICD-10-CM | POA: Diagnosis not present

## 2020-09-01 DIAGNOSIS — C44529 Squamous cell carcinoma of skin of other part of trunk: Secondary | ICD-10-CM | POA: Diagnosis not present

## 2020-09-01 DIAGNOSIS — Z85828 Personal history of other malignant neoplasm of skin: Secondary | ICD-10-CM | POA: Diagnosis not present

## 2020-09-02 ENCOUNTER — Other Ambulatory Visit: Payer: Self-pay | Admitting: Internal Medicine

## 2020-09-02 DIAGNOSIS — I10 Essential (primary) hypertension: Secondary | ICD-10-CM

## 2020-09-02 DIAGNOSIS — E785 Hyperlipidemia, unspecified: Secondary | ICD-10-CM

## 2020-09-02 DIAGNOSIS — E118 Type 2 diabetes mellitus with unspecified complications: Secondary | ICD-10-CM

## 2020-09-02 MED ORDER — TORSEMIDE 10 MG PO TABS: 10.0000 mg | ORAL_TABLET | Freq: Every day | ORAL | 1 refills | Status: DC

## 2020-09-02 MED ORDER — ATORVASTATIN CALCIUM 10 MG PO TABS
10.0000 mg | ORAL_TABLET | Freq: Every day | ORAL | 1 refills | Status: DC
Start: 2020-09-02 — End: 2021-03-19

## 2020-09-10 ENCOUNTER — Other Ambulatory Visit: Payer: Self-pay | Admitting: Internal Medicine

## 2020-09-10 ENCOUNTER — Telehealth: Payer: Self-pay | Admitting: Internal Medicine

## 2020-09-10 NOTE — Telephone Encounter (Signed)
1.Medication Requested: KOMBIGLYZE XR 2.12-998 MG TB24    2. Pharmacy (Name, [place]): [facility]   3. On Med List: no   4. Last Visit with PCP: 7.21.21  5. Next visit date with PCP: 1.24.22   Agent: Please be advised that RX refills may take up to 3 business days. We ask that you follow-up with your pharmacy.

## 2020-09-11 NOTE — Telephone Encounter (Signed)
Noted  

## 2020-09-11 NOTE — Telephone Encounter (Signed)
This has been discontinued  TJ

## 2020-09-21 ENCOUNTER — Ambulatory Visit (INDEPENDENT_AMBULATORY_CARE_PROVIDER_SITE_OTHER): Payer: Medicare Other | Admitting: Internal Medicine

## 2020-09-21 ENCOUNTER — Encounter: Payer: Self-pay | Admitting: Internal Medicine

## 2020-09-21 ENCOUNTER — Other Ambulatory Visit: Payer: Self-pay

## 2020-09-21 ENCOUNTER — Ambulatory Visit (INDEPENDENT_AMBULATORY_CARE_PROVIDER_SITE_OTHER): Payer: Medicare Other

## 2020-09-21 VITALS — BP 142/82 | HR 68 | Temp 98.3°F | Ht 69.0 in | Wt 194.0 lb

## 2020-09-21 DIAGNOSIS — Z23 Encounter for immunization: Secondary | ICD-10-CM | POA: Diagnosis not present

## 2020-09-21 DIAGNOSIS — S0101XD Laceration without foreign body of scalp, subsequent encounter: Secondary | ICD-10-CM | POA: Diagnosis not present

## 2020-09-21 DIAGNOSIS — G301 Alzheimer's disease with late onset: Secondary | ICD-10-CM | POA: Diagnosis not present

## 2020-09-21 DIAGNOSIS — R059 Cough, unspecified: Secondary | ICD-10-CM

## 2020-09-21 DIAGNOSIS — E538 Deficiency of other specified B group vitamins: Secondary | ICD-10-CM | POA: Diagnosis not present

## 2020-09-21 DIAGNOSIS — N183 Chronic kidney disease, stage 3 unspecified: Secondary | ICD-10-CM | POA: Diagnosis not present

## 2020-09-21 DIAGNOSIS — H6122 Impacted cerumen, left ear: Secondary | ICD-10-CM

## 2020-09-21 DIAGNOSIS — N2889 Other specified disorders of kidney and ureter: Secondary | ICD-10-CM

## 2020-09-21 DIAGNOSIS — E118 Type 2 diabetes mellitus with unspecified complications: Secondary | ICD-10-CM | POA: Diagnosis not present

## 2020-09-21 DIAGNOSIS — J449 Chronic obstructive pulmonary disease, unspecified: Secondary | ICD-10-CM

## 2020-09-21 DIAGNOSIS — F0280 Dementia in other diseases classified elsewhere without behavioral disturbance: Secondary | ICD-10-CM | POA: Diagnosis not present

## 2020-09-21 DIAGNOSIS — J4489 Other specified chronic obstructive pulmonary disease: Secondary | ICD-10-CM

## 2020-09-21 DIAGNOSIS — R052 Subacute cough: Secondary | ICD-10-CM | POA: Insufficient documentation

## 2020-09-21 LAB — CBC WITH DIFFERENTIAL/PLATELET
Basophils Absolute: 0 10*3/uL (ref 0.0–0.1)
Basophils Relative: 0.4 % (ref 0.0–3.0)
Eosinophils Absolute: 0.3 10*3/uL (ref 0.0–0.7)
Eosinophils Relative: 2.8 % (ref 0.0–5.0)
HCT: 37.9 % — ABNORMAL LOW (ref 39.0–52.0)
Hemoglobin: 12.6 g/dL — ABNORMAL LOW (ref 13.0–17.0)
Lymphocytes Relative: 32.2 % (ref 12.0–46.0)
Lymphs Abs: 3.2 10*3/uL (ref 0.7–4.0)
MCHC: 33.3 g/dL (ref 30.0–36.0)
MCV: 85.7 fl (ref 78.0–100.0)
Monocytes Absolute: 1.2 10*3/uL — ABNORMAL HIGH (ref 0.1–1.0)
Monocytes Relative: 11.5 % (ref 3.0–12.0)
Neutro Abs: 5.3 10*3/uL (ref 1.4–7.7)
Neutrophils Relative %: 53.1 % (ref 43.0–77.0)
Platelets: 250 10*3/uL (ref 150.0–400.0)
RBC: 4.42 Mil/uL (ref 4.22–5.81)
RDW: 15 % (ref 11.5–15.5)
WBC: 10.1 10*3/uL (ref 4.0–10.5)

## 2020-09-21 LAB — BASIC METABOLIC PANEL
BUN: 19 mg/dL (ref 6–23)
CO2: 28 mEq/L (ref 19–32)
Calcium: 8.9 mg/dL (ref 8.4–10.5)
Chloride: 105 mEq/L (ref 96–112)
Creatinine, Ser: 1.51 mg/dL — ABNORMAL HIGH (ref 0.40–1.50)
GFR: 40.35 mL/min — ABNORMAL LOW (ref >60.00)
Glucose, Bld: 138 mg/dL — ABNORMAL HIGH (ref 70–99)
Potassium: 3.8 mEq/L (ref 3.5–5.1)
Sodium: 139 mEq/L (ref 135–145)

## 2020-09-21 LAB — POCT GLYCOSYLATED HEMOGLOBIN (HGB A1C): Hemoglobin A1C: 7 % — AB (ref 4.0–5.6)

## 2020-09-21 LAB — VITAMIN B12: Vitamin B-12: 767 pg/mL (ref 211–911)

## 2020-09-21 LAB — FOLATE: Folate: 10.4 ng/mL (ref >5.9)

## 2020-09-21 MED ORDER — METFORMIN HCL 500 MG PO TABS: 500.0000 mg | ORAL_TABLET | Freq: Two times a day (BID) | ORAL | 0 refills | Status: DC

## 2020-09-21 MED ORDER — REVEFENACIN 175 MCG/3ML IN SOLN: 175.0000 ug | Freq: Every day | RESPIRATORY_TRACT | 1 refills | Status: DC

## 2020-09-21 NOTE — Progress Notes (Signed)
Subjective:  Patient ID: Nicholas Burton, male    DOB: 05-Sep-1927  Age: 87 y.o. MRN: [id number]  CC: Diabetes  This visit occurred during the SARS-CoV-2 public health emergency.  Safety protocols were in place, including screening questions prior to the visit, additional usage of staff PPE, and extensive cleaning of exam room while observing appropriate contact time as indicated for disinfecting solutions.    HPI Nicholas Burton presents for f/up -  1. Cough - He complains of persistent nonproductive cough.  It sounds like he is using the ICS but not the LAMA.  He denies chest pain, hemoptysis, fever, or chills.  2. Scalp laceration- He fell 2 weeks ago and sustained a laceration on the top of his scalp.  He was seen by EMS and it was recommended that he receive transfer for suturing.  He deferred.  The area has healed nicely and does not bother him.  3. DM2- He complains of his blood sugar is intermittently going up to 200.  He denies polys.  4. Hearing loss - Despite wearing hearing aids he complains of hearing loss especially on the left side.  Outpatient Medications Prior to Visit  Medication Sig Dispense Refill  . aspirin EC 81 MG tablet Take 81 mg by mouth daily.    Marland Kitchen atorvastatin (LIPITOR) 10 MG tablet Take 1 tablet (10 mg total) by mouth daily. 90 tablet 1  . Blood Glucose Monitoring Suppl (FREESTYLE FREEDOM LITE) W/DEVICE KIT Use to check blood sugars daily Dx E11.8 1 each 0  . cholecalciferol (VITAMIN D) 1000 units tablet Take 1,000 Units by mouth daily.    . Colesevelam HCl 3.75 g PACK MIX THE CONTENTS OF 1 PACKET IN FLUID AND DRINK DAILY 90 packet 3  . cyanocobalamin 1000 MCG tablet Take 1,000 mcg by mouth daily.    . Dietary Management Product (VASCULERA) TABS Take 1 tablet by mouth daily. 30 tablet 11  . glucose blood (FREESTYLE LITE) test strip USE AS INSTRUCTED TO CHECK BLOOD SUGAR TWICE A DAY 200 each 3  . Insulin Pen Needle (B-D ULTRAFINE III [place] PEN) 31G X 8 MM MISC  Use to inject insulin daily. DX: E11.9 300 each 3  . Lancets (FREESTYLE) lancets Use to check blood sugars twice day Dx E11.8 180 each 3  . losartan (COZAAR) 25 MG tablet TAKE 1 TABLET DAILY 90 tablet 3  . metoprolol tartrate (LOPRESSOR) 25 MG tablet TAKE ONE-HALF (1/2) TABLET TWICE A DAY 90 tablet 3  . Multiple Vitamins-Minerals (PRESERVISION/LUTEIN) CAPS Take 1 capsule by mouth 2 (two) times daily.    Marland Kitchen torsemide (DEMADEX) 10 MG tablet Take 1 tablet (10 mg total) by mouth daily. 90 tablet 1  . Vibegron (GEMTESA) 75 MG TABS Take 1 tablet by mouth daily. 90 tablet 1  . revefenacin (YUPELRI) 175 MCG/3ML nebulizer solution Take 3 mLs (175 mcg total) by nebulization daily. 270 mL 1   No facility-administered medications prior to visit.    ROS Review of Systems  Constitutional: Negative.  Negative for diaphoresis, fatigue and unexpected weight change.  HENT: Negative for congestion, facial swelling, sore throat and trouble swallowing.   Eyes: Negative.  Negative for visual disturbance.  Respiratory: Positive for cough. Negative for chest tightness, shortness of breath and wheezing.   Cardiovascular: Negative for chest pain, palpitations and leg swelling.  Gastrointestinal: Negative for abdominal pain, constipation, diarrhea, nausea and vomiting.  Endocrine: Negative.  Negative for cold intolerance, heat intolerance, polydipsia, polyphagia and polyuria.  Genitourinary: Negative.  Negative for difficulty urinating and dysuria.  Musculoskeletal: Negative.  Negative for back pain and myalgias.  Skin: Positive for wound.  Neurological: Negative.  Negative for dizziness, weakness, light-headedness, numbness and headaches.  Hematological: Negative for adenopathy. Does not bruise/bleed easily.  Psychiatric/Behavioral: Positive for confusion and decreased concentration. Negative for agitation, behavioral problems, sleep disturbance and suicidal ideas. The patient is not nervous/anxious and is not  hyperactive.     Objective:  BP (!) 142/82   Pulse 68   Temp 98.3 F (36.8 C) (Oral)   Ht 5' 9"  (1.753 m)   Wt 194 lb (88 kg)   SpO2 98%   BMI 28.65 kg/m   BP Readings from Last 3 Encounters:  09/21/20 (!) 142/82  04/21/20 128/66  03/18/20 120/70    Wt Readings from Last 3 Encounters:  09/21/20 194 lb (88 kg)  04/21/20 198 lb 3.2 oz (89.9 kg)  03/18/20 198 lb (89.8 kg)    Physical Exam Vitals reviewed.  HENT:     Right Ear: Tympanic membrane and external ear normal. No decreased hearing noted. There is no impacted cerumen.     Left Ear: Tympanic membrane and external ear normal. Decreased hearing noted. There is impacted cerumen.     Mouth/Throat:     Mouth: Mucous membranes are moist.  Eyes:     General: No scleral icterus.    Pupils: Pupils are equal, round, and reactive to light.  Cardiovascular:     Rate and Rhythm: Normal rate and regular rhythm.     Heart sounds: No murmur heard.   Pulmonary:     Effort: Pulmonary effort is normal.     Breath sounds: No stridor. Examination of the right-upper field reveals rhonchi. Examination of the right-middle field reveals rhonchi. Examination of the right-lower field reveals rhonchi. Rhonchi present. No decreased breath sounds, wheezing or rales.  Abdominal:     General: Abdomen is protuberant. Bowel sounds are normal. There is no distension.     Palpations: Abdomen is soft. There is no hepatomegaly, splenomegaly or mass.     Tenderness: There is no abdominal tenderness.  Musculoskeletal:        General: Normal range of motion.     Cervical back: Neck supple.     Right lower leg: No edema.     Left lower leg: No edema.  Lymphadenopathy:     Cervical: No cervical adenopathy.  Skin:    General: Skin is warm and dry.     Findings: No rash.  Neurological:     General: No focal deficit present.     Mental Status: He is alert.     Lab Results  Component Value Date   WBC 10.1 09/21/2020   HGB 12.6 (L) 09/21/2020    HCT 37.9 (L) 09/21/2020   PLT 250.0 09/21/2020   GLUCOSE 138 (H) 09/21/2020   CHOL 83 09/17/2019   TRIG 136.0 09/17/2019   HDL 36.40 (L) 09/17/2019   LDLCALC 19 09/17/2019   ALT 13 09/17/2019   AST 17 09/17/2019   NA 139 09/21/2020   K 3.8 09/21/2020   CL 105 09/21/2020   CREATININE 1.51 (H) 09/21/2020   BUN 19 09/21/2020   CO2 28 09/21/2020   TSH 4.25 09/17/2019   PSA 11.21 (H) 10/30/2017   INR 1.3 ratio (H) 08/11/2010   HGBA1C 7.0 (A) 09/21/2020   MICROALBUR 0.7 09/17/2019    DG Chest 2 View  Result Date: 05/16/2019 CLINICAL DATA:  Cough and wheezing  for 2 days. EXAM: CHEST - 2 VIEW COMPARISON:  02/05/2016. FINDINGS: Cardiac silhouette is normal in size. No mediastinal or hilar masses. No evidence of adenopathy. Clear lungs.  No pleural effusion or pneumothorax. Skeletal structures are intact. IMPRESSION: No active cardiopulmonary disease. Electronically Signed   By: Lajean Manes M.D.   On: 05/16/2019 14:38   DG Chest 2 View  Result Date: 09/21/2020 CLINICAL DATA:  Cough EXAM: CHEST - 2 VIEW COMPARISON:  05/16/2019 FINDINGS: Heart is normal size. Scarring in the lingula/left base, stable. No confluent opacities or effusions. No acute bony abnormality. IMPRESSION: No active cardiopulmonary disease. Electronically Signed   By: Rolm Baptise M.D.   On: 09/21/2020 15:40    Assessment & Plan:   Ronit was seen today for diabetes.  Diagnoses and all orders for this visit:  Cough-  His CXR is negative for mass/infiltrate. Will restart the LAMA. -     DG Chest 2 View; Future  Hearing loss secondary to cerumen impaction, left- Cerumen removed. Hearing improved.  Type II diabetes mellitus with manifestations ([place])- His A1C is up to 7.0%. will restart metformin. -     POCT glycosylated hemoglobin (Hb A1C) -     Basic metabolic panel; Future -     metFORMIN (GLUCOPHAGE) 500 MG tablet; Take 1 tablet (500 mg total) by mouth 2 (two) times daily with a meal. -     Basic metabolic  panel  COPD with asthma ([place]) -     revefenacin (YUPELRI) 175 MCG/3ML nebulizer solution; Take 3 mLs (175 mcg total) by nebulization daily.  Scalp laceration, subsequent encounter- This has healed well with no complications.  B12 deficiency- B12 and folate are normal. -     CBC with Differential/Platelet; Future -     Folate; Future -     Vitamin B12; Future -     Vitamin B12 -     Folate -     CBC with Differential/Platelet  Late onset Alzheimer's dementia without behavioral disturbance ([place]) -     Ambulatory referral to Hospice  Other orders -     Tdap vaccine greater than or equal to 7yo IM   I am having [name]. Gruhn start on metFORMIN. I am also having him maintain his PreserVision/Lutein, FreeStyle [place] Lite, freestyle, cyanocobalamin, cholecalciferol, aspirin EC, B-D ULTRAFINE III [place] PEN, FREESTYLE LITE, Vasculera, Gemtesa, Colesevelam HCl, losartan, metoprolol tartrate, atorvastatin, torsemide, and revefenacin.  Meds ordered this encounter  Medications  . revefenacin (YUPELRI) 175 MCG/3ML nebulizer solution    Sig: Take 3 mLs (175 mcg total) by nebulization daily.    Dispense:  270 mL    Refill:  1  . metFORMIN (GLUCOPHAGE) 500 MG tablet    Sig: Take 1 tablet (500 mg total) by mouth 2 (two) times daily with a meal.    Dispense:  180 tablet    Refill:  0     Follow-up: Return in about 3 months (around 12/20/2020).  Scarlette Calico, MD

## 2020-09-21 NOTE — Progress Notes (Deleted)
New Patient Office Visit  Subjective:  Patient ID: Nicholas Burton, male    DOB: Jan 25, 1928  Age: 87 y.o. MRN: [id number]  CC:  Chief Complaint  Patient presents with  . Diabetes    HPI Nicholas Burton presents for ***  Past Medical History:  Diagnosis Date  . Allergy   . ANEMIA ASSOCIATED W/OTHER [facility] NUTRITIONAL DEFIC 08/11/2010   Qualifier: Diagnosis of  By: Ronnald Ramp MD, [name].   . Cancer ([place])    skin  . Cataract   . Chronic combined systolic and diastolic congestive heart failure ([place]) 10/30/2018  . COPD with asthma ([place]) 03/02/2017  . CRI (chronic renal insufficiency), stage 3 (moderate) ([place]) 10/25/2018  . Deficiency anemia 10/25/2018  . Diabetes mellitus    type 2  . DOE (dyspnea on exertion) 09/03/2018  . Edema 06/11/2009   Qualifier: Diagnosis of  By: Ronnald Ramp MD, [name].   . History of skin cancer   . Hyperlipidemia with target LDL less than 100 11/10/2008       . Hypertension   . Memory loss 05/14/2009  . Obesity (BMI 30.0-34.9) 04/01/2013  . Osteoarthritis   . PSA elevation 10/25/2018  . SKIN CANCER, HX OF 11/10/2008   Qualifier: Diagnosis of  By: Ronnald Ramp MD, [name].   . Snoring 04/03/2014  . Type II diabetes mellitus with manifestations ([place]) 11/10/2008   Estimated Creatinine Clearance: 38 mL/min (A) (by C-G formula based on SCr of 1.52 mg/dL (H)).   . Venous stasis dermatitis of both lower extremities 01/29/2018  . Vitamin D deficiency 11/10/2008    Past Surgical History:  Procedure Laterality Date  . CHOLECYSTECTOMY    . INGUINAL HERNIA REPAIR     x 3  . JOINT REPLACEMENT    . KNEE SURGERY     x 2  . MELANOMA EXCISION     x 3 -- Left arm  . TOTAL KNEE ARTHROPLASTY     x 3    Family History  Problem Relation Age of Onset  . Heart disease Mother   . Diabetes Mother   . Heart disease Father   . Cancer Sister   . Cancer Brother   . Diabetes Son   . Diabetes Other     Social History   Socioeconomic History  . Marital status: Married    Spouse name: Not  on file  . Number of children: 4  . Years of education: Not on file  . Highest education level: Not on file  Occupational History  . Occupation: retired    Fish farm manager: RETIRED  Tobacco Use  . Smoking status: Never Smoker  . Smokeless tobacco: Never Used  Vaping Use  . Vaping Use: Never used  Substance and Sexual Activity  . Alcohol use: No  . Drug use: No  . Sexual activity: Yes    Birth control/protection: None  Other Topics Concern  . Not on file  Social History Narrative   No regular exercise   Social Determinants of Health   Financial Resource Strain: Low Risk   . Difficulty of Paying Living Expenses: Not hard at all  Food Insecurity: No Food Insecurity  . Worried About Charity fundraiser in the Last Year: Never true  . Ran Out of Food in the Last Year: Never true  Transportation Needs: No Transportation Needs  . Lack of Transportation (Medical): No  . Lack of Transportation (Non-Medical): No  Physical Activity: Sufficiently Active  . Days of Exercise per Week:  5 days  . Minutes of Exercise per Session: 30 min  Stress: No Stress Concern Present  . Feeling of Stress : Not at all  Social Connections: Socially Integrated  . Frequency of Communication with Friends and Family: More than three times a week  . Frequency of Social Gatherings with Friends and Family: More than three times a week  . Attends Religious Services: More than 4 times per year  . Active Member of Clubs or Organizations: Yes  . Attends Archivist Meetings: More than 4 times per year  . Marital Status: Married  Human resources officer Violence: Not At Risk  . Fear of Current or Ex-Partner: No  . Emotionally Abused: No  . Physically Abused: No  . Sexually Abused: No    ROS Review of Systems  Objective:   Today's Vitals: BP (!) 142/82   Pulse 68   Temp 98.3 F (36.8 C) (Oral)   Ht 5' 9"  (1.753 m)   Wt 194 lb (88 kg)   SpO2 98%   BMI 28.65 kg/m   Physical Exam  Assessment & Plan:    Problem List Items Addressed This Visit      Respiratory   COPD with asthma ([place])   Relevant Medications   revefenacin (YUPELRI) 175 MCG/3ML nebulizer solution     Endocrine   Type II diabetes mellitus with manifestations ([place])   Relevant Orders   POCT glycosylated hemoglobin (Hb A1C) (Completed)   Basic metabolic panel     Nervous and Auditory   Hearing loss secondary to cerumen impaction, left     Other   B12 deficiency   Relevant Orders   CBC with Differential/Platelet   Folate   Vitamin B12   Cough - Primary   Relevant Orders   DG Chest 2 View   Scalp laceration, subsequent encounter      Outpatient Encounter Medications as of 09/21/2020  Medication Sig  . aspirin EC 81 MG tablet Take 81 mg by mouth daily.  Marland Kitchen atorvastatin (LIPITOR) 10 MG tablet Take 1 tablet (10 mg total) by mouth daily.  . Blood Glucose Monitoring Suppl (FREESTYLE FREEDOM LITE) W/DEVICE KIT Use to check blood sugars daily Dx E11.8  . cholecalciferol (VITAMIN D) 1000 units tablet Take 1,000 Units by mouth daily.  . Colesevelam HCl 3.75 g PACK MIX THE CONTENTS OF 1 PACKET IN FLUID AND DRINK DAILY  . cyanocobalamin 1000 MCG tablet Take 1,000 mcg by mouth daily.  . Dietary Management Product (VASCULERA) TABS Take 1 tablet by mouth daily.  Marland Kitchen glucose blood (FREESTYLE LITE) test strip USE AS INSTRUCTED TO CHECK BLOOD SUGAR TWICE A DAY  . Insulin Pen Needle (B-D ULTRAFINE III [place] PEN) 31G X 8 MM MISC Use to inject insulin daily. DX: E11.9  . Lancets (FREESTYLE) lancets Use to check blood sugars twice day Dx E11.8  . losartan (COZAAR) 25 MG tablet TAKE 1 TABLET DAILY  . metoprolol tartrate (LOPRESSOR) 25 MG tablet TAKE ONE-HALF (1/2) TABLET TWICE A DAY  . Multiple Vitamins-Minerals (PRESERVISION/LUTEIN) CAPS Take 1 capsule by mouth 2 (two) times daily.  Marland Kitchen torsemide (DEMADEX) 10 MG tablet Take 1 tablet (10 mg total) by mouth daily.  . Vibegron (GEMTESA) 75 MG TABS Take 1 tablet by mouth daily.  .  [DISCONTINUED] revefenacin (YUPELRI) 175 MCG/3ML nebulizer solution Take 3 mLs (175 mcg total) by nebulization daily.  . revefenacin (YUPELRI) 175 MCG/3ML nebulizer solution Take 3 mLs (175 mcg total) by nebulization daily.   No facility-administered encounter medications  on file as of 09/21/2020.    Follow-up: No follow-ups on file.   [place], [place]

## 2020-09-21 NOTE — Patient Instructions (Signed)
Type 2 Diabetes Mellitus, Diagnosis, Adult Type 2 diabetes (type 2 diabetes mellitus) is a long-term, or chronic, disease. In type 2 diabetes, one or both of these problems may be present:  The pancreas does not make enough of a hormone called insulin.  Cells in the body do not respond properly to insulin that the body makes (insulin resistance). Normally, insulin allows blood sugar (glucose) to enter cells in the body. The cells use glucose for energy. Insulin resistance or lack of insulin causes excess glucose to build up in the blood instead of going into cells. This causes high blood glucose (hyperglycemia).  What are the causes? The exact cause of type 2 diabetes is not known. What increases the risk? The following factors may make you more likely to develop this condition:  Having a family member with type 2 diabetes.  Being overweight or obese.  Being inactive (sedentary).  Having been diagnosed with insulin resistance.  Having a history of prediabetes, diabetes when you were pregnant (gestational diabetes), or polycystic ovary syndrome (PCOS). What are the signs or symptoms? In the early stage of this condition, you may not have symptoms. Symptoms develop slowly and may include:  Increased thirst or hunger.  Increased urination.  Unexplained weight loss.  Tiredness (fatigue) or weakness.  Vision changes, such as blurry vision.  Dark patches on the skin. How is this diagnosed? This condition is diagnosed based on your symptoms, your medical history, a physical exam, and your blood glucose level. Your blood glucose may be checked with one or more of the following blood tests:  A fasting blood glucose (FBG) test. You will not be allowed to eat (you will fast) for 8 hours or longer before a blood sample is taken.  A random blood glucose test. This test checks blood glucose at any time of day regardless of when you ate.  An A1C (hemoglobin A1C) blood test. This test  provides information about blood glucose levels over the previous 2-3 months.  An oral glucose tolerance test (OGTT). This test measures your blood glucose at two times: ? After fasting. This is your baseline blood glucose level. ? Two hours after drinking a beverage that contains glucose. You may be diagnosed with type 2 diabetes if:  Your fasting blood glucose level is 126 mg/dL (7.0 mmol/L) or higher.  Your random blood glucose level is 200 mg/dL (11.1 mmol/L) or higher.  Your A1C level is 6.5% or higher.  Your oral glucose tolerance test result is higher than 200 mg/dL (11.1 mmol/L). These blood tests may be repeated to confirm your diagnosis.   How is this treated? Your treatment may be managed by a specialist called an endocrinologist. Type 2 diabetes may be treated by following instructions from your health care provider about:  Making dietary and lifestyle changes. These may include: ? Following a personalized nutrition plan that is developed by a registered dietitian. ? Exercising regularly. ? Finding ways to manage stress.  Checking your blood glucose level as often as told.  Taking diabetes medicines or insulin daily. This helps to keep your blood glucose levels in the healthy range.  Taking medicines to help prevent complications from diabetes. Medicines may include: ? Aspirin. ? Medicine to lower cholesterol. ? Medicine to control blood pressure. Your health care provider will set treatment goals for you. Your goals will be based on your age, other medical conditions you have, and how you respond to diabetes treatment. Generally, the goal of treatment is to maintain the   following blood glucose levels:  Before meals: 80-130 mg/dL (4.4-7.2 mmol/L).  After meals: below 180 mg/dL (10 mmol/L).  A1C level: less than 7%. Follow these instructions at home: Questions to ask your health care provider Consider asking the following questions:  Should I meet with a certified  diabetes care and education specialist?  What diabetes medicines do I need, and when should I take them?  What equipment will I need to manage my diabetes at home?  How often do I need to check my blood glucose?  Where can I find a support group for people with diabetes?  What number can I call if I have questions?  When is my next appointment? General instructions  Take over-the-counter and prescription medicines only as told by your health care provider.  Keep all follow-up visits as told by your health care provider. This is important. Where to find more information  American Diabetes Association (ADA): www.diabetes.org  American Association of Diabetes Care and Education Specialists (ADCES): www.diabeteseducator.org  International Diabetes Federation (IDF): www.idf.org Contact a health care provider if:  Your blood glucose is at or above 240 mg/dL (13.3 mmol/L) for 2 days in a row.  You have been sick or have had a fever for 2 days or longer, and you are not getting better.  You have any of the following problems for more than 6 hours: ? You cannot eat or drink. ? You have nausea and vomiting. ? You have diarrhea. Get help right away if:  You have severe hypoglycemia. This means your blood glucose is lower than 54 mg/dL (3.0 mmol/L).  You become confused or you have trouble thinking clearly.  You have difficulty breathing.  You have moderate or large ketone levels in your urine. These symptoms may represent a serious problem that is an emergency. Do not wait to see if the symptoms will go away. Get medical help right away. Call your local emergency services (911 in the [place] Do not drive yourself to the hospital. Summary  Type 2 diabetes (type 2 diabetes mellitus) is a long-term, or chronic, disease. In type 2 diabetes, the pancreas does not make enough of a hormone called insulin, or cells in the body do not respond properly to insulin that the body makes (insulin  resistance).  This condition is treated by making dietary and lifestyle changes and taking diabetes medicines or insulin.  Your health care provider will set treatment goals for you. Your goals will be based on your age, other medical conditions you have, and how you respond to diabetes treatment.  Keep all follow-up visits as told by your health care provider. This is important. This information is not intended to replace advice given to you by your health care provider. Make sure you discuss any questions you have with your health care provider. Document Revised: 03/11/2020 Document Reviewed: 03/11/2020 Elsevier Patient Education  2021 Elsevier Inc.  

## 2020-09-21 NOTE — Progress Notes (Signed)
Patient consent obtained. Irrigation with water and peroxide performed. Full view of tympanic membranes after procedure.  Patient tolerated procedure well.   

## 2020-09-22 ENCOUNTER — Telehealth: Payer: Self-pay

## 2020-09-22 ENCOUNTER — Telehealth: Payer: Self-pay | Admitting: Internal Medicine

## 2020-09-22 NOTE — Telephone Encounter (Signed)
Daughter Saundra calling to report the phone line is down for patient. Call test results to [phone] 

## 2020-09-22 NOTE — Telephone Encounter (Signed)
Spoke with patient's DIL Katharine Look and scheduled an in-person Palliative Consult for  10/21/20 @ 10 AM. COVID screening was negative. No pets in home. Patient lives with wife. Consent obtained; updated Outlook/Netsmart/Team List and Epic. Family is aware they will be receiving a call from NP the day before or day of to confirm appointment. LNW

## 2020-09-22 NOTE — Telephone Encounter (Signed)
Attempted to contact patient to schedule a Palliative Care consult appointment. No answer left a message to return call.  

## 2020-10-13 DIAGNOSIS — H353211 Exudative age-related macular degeneration, right eye, with active choroidal neovascularization: Secondary | ICD-10-CM | POA: Diagnosis not present

## 2020-10-13 DIAGNOSIS — H3581 Retinal edema: Secondary | ICD-10-CM | POA: Diagnosis not present

## 2020-10-13 DIAGNOSIS — H353223 Exudative age-related macular degeneration, left eye, with inactive scar: Secondary | ICD-10-CM | POA: Diagnosis not present

## 2020-10-13 DIAGNOSIS — H43813 Vitreous degeneration, bilateral: Secondary | ICD-10-CM | POA: Diagnosis not present

## 2020-10-21 ENCOUNTER — Other Ambulatory Visit: Payer: Self-pay

## 2020-10-21 ENCOUNTER — Other Ambulatory Visit: Payer: Medicare Other | Admitting: Student

## 2020-10-21 DIAGNOSIS — Z515 Encounter for palliative care: Secondary | ICD-10-CM | POA: Diagnosis not present

## 2020-10-21 DIAGNOSIS — R0602 Shortness of breath: Secondary | ICD-10-CM | POA: Diagnosis not present

## 2020-10-21 DIAGNOSIS — I5042 Chronic combined systolic (congestive) and diastolic (congestive) heart failure: Secondary | ICD-10-CM

## 2020-10-21 DIAGNOSIS — R600 Localized edema: Secondary | ICD-10-CM | POA: Diagnosis not present

## 2020-10-21 NOTE — Progress Notes (Signed)
[facility] Palliative Care Consult Note Telephone: [phone]  Fax: [phone]  PATIENT NAME: Nicholas Burton [id number] [phone] (home)  DOB: 1927-10-10 MRN: [id number]  PRIMARY CARE PROVIDER:    [name], MD,  [place] [address] [phone]  REFERRING PROVIDER:   [name], MD [address] [place],  [place] [address] [phone]  RESPONSIBLE PARTY:   Extended Emergency Contact Information Primary Emergency Contact: Stensland,Joan Address: [place], [place] Home Phone: [phone] Relation: Other Secondary Emergency Contact: Hefel,Curtis Address: [place], [place] [address] [place] Mobile Phone: [phone] Relation: Son  I met face to face with patient and family in patient's home.  ASSESSMENT AND RECOMMENDATIONS:   Advance Care Planning: Visit at the request of Dr. for palliative consult. Visit consisted of building trust and discussions on Palliative care medicine as specialized medical care for people living with serious illness, aimed at facilitating improved quality of life through symptoms relief, assisting with advance care planning and establishing goals of care. Education provided on Palliative vs. Hospice services. Palliative care will continue to provide support to patient, family and the medical team.  Goal of care: To maintain current level of care.  Directives: Reviewed MOST form; family would like to review further.  Symptom Management:   Shortness of breath-patient with combined diastolic, systolic HF, COPD w/asthma. Patient with shortness of breath at rest. Continue Yupelri nebulizer as directed. Continue torsemide and metoprolol as ordered for HF/hypertension.   Edema-Patient with combined diastolic, systolic HF. Recommend continuing torsemide 44m daily, elevate legs when sitting in  recliner or in bed. Discussed compression socks. Monitor for salt intake, low salt diet encouraged. Continue torsemide and metoprolol as ordered for HF/hypertension.  Hypoglycemia episode-patient reports an AM blood sugar of 46 recently. He is encouraged to eat a protein snack at bedtime. Continue metformin and insulin as directed per PCP. Continue to check blood sugars BID.   Follow up Palliative Care Visit: Palliative care will continue to follow for complex decision making and symptom management. Return in 6 weeks or prn.  Family /Caregiver/Community Supports: [facility] comes out monthly. Palliative Care will continue to provide support to patient and family. Referral made for Meals on Wheels.    CHIEF COMPLAINT: Palliative Medicine initial consult.  History obtained from review of EMR, discussion with primary team, and  interview with family. Records reviewed and summarized below.  HISTORY OF PRESENT ILLNESS:  Nicholas EDELLis a 87y.o. year old male with multiple medical problems including T2DM, COPD with asthma, chronic systolic and diastolic heart failure, late onset Alzheimer's dementia, Vitamin B12 deficiency, hearing loss. Palliative Care was asked to follow this patient by consultation request of [name] MD to help address advance care planning and goals of care. This is an initial visit.  Nicholas [place] lives with his wife. He report shortness of breath with exertion. Occasional cough. Swelling to lower extremities, right > left. He checks his blood sugar twice daily; am blood sugars 120-'130's, HS blood sugars up to 2025mdL. Patient reports one AM blood sugar at 46 recently, otherwise no hypo or hyperglycemic episodes. Currently taking metformin 50058mID and lantus 30 units QHS.  CODE STATUS: Full Code  PPS: 50%  HOSPICE ELIGIBILITY/DIAGNOSIS: TBD  ROS   General: NAD EYES:  endorses poor vision ENMT: denies dysphagia Cardiovascular: denies chest  pain Pulmonary: occasional cough, increased SOB w/exertion Abdomen: endorses good appetite; denies constipation GU: denies dysuria, continence of urine MSK: no falls reported Skin: denies rashes or wounds Neurological: endorses weakness Psych: Endorses positive mood Heme/lymph/immuno: denies bruises, abnormal bleeding   Physical Exam: Pulse 64, respirations 16, 120/72, sats 98% Constitutional: NAD, alert and oriented x 3 General: frail appearing, thin EYES: anicteric sclera, lids intact, no discharge  ENMT: HOH hearing w/bilateral hearing aids present,oral mucous membranes moist, hairy tongue CV: RRR,  LE edema non-pitting, right > left Pulmonary: crackles to bilateral bases, no increased work of breathing, no cough, faint inspiratory wheeze Abdomen: bowel sounds normoactive x 4 GU: deferred MSK: ambulatory without assistive device Skin: warm and dry, no rashes or wounds on visible skin Neuro: Generalized weakness, severe cognitive impairment Psych: pleasant mood, non anxious Hem/lymph/immuno: no widespread bruising   PAST MEDICAL HISTORY:  Past Medical History:  Diagnosis Date  . Allergy   . ANEMIA ASSOCIATED W/OTHER [facility] NUTRITIONAL DEFIC 08/11/2010   Qualifier: Diagnosis of  By: Ronnald Ramp MD, [name].   . Cancer ([place])    skin  . Cataract   . Chronic combined systolic and diastolic congestive heart failure ([place]) 10/30/2018  . COPD with asthma ([place]) 03/02/2017  . CRI (chronic renal insufficiency), stage 3 (moderate) ([place]) 10/25/2018  . Deficiency anemia 10/25/2018  . Diabetes mellitus    type 2  . DOE (dyspnea on exertion) 09/03/2018  . Edema 06/11/2009   Qualifier: Diagnosis of  By: Ronnald Ramp MD, [name].   . History of skin cancer   . Hyperlipidemia with target LDL less than 100 11/10/2008       . Hypertension   . Memory loss 05/14/2009  . Obesity (BMI 30.0-34.9) 04/01/2013  . Osteoarthritis   . PSA elevation 10/25/2018  . SKIN CANCER, HX OF 11/10/2008   Qualifier: Diagnosis of   By: Ronnald Ramp MD, [name].   . Snoring 04/03/2014  . Type II diabetes mellitus with manifestations ([place]) 11/10/2008   Estimated Creatinine Clearance: 38 mL/min (A) (by C-G formula based on SCr of 1.52 mg/dL (H)).   . Venous stasis dermatitis of both lower extremities 01/29/2018  . Vitamin D deficiency 11/10/2008    SOCIAL HX:  Social History   Tobacco Use  . Smoking status: Never Smoker  . Smokeless tobacco: Never Used  Substance Use Topics  . Alcohol use: No   FAMILY HX:  Family History  Problem Relation Age of Onset  . Heart disease Mother   . Diabetes Mother   . Heart disease Father   . Cancer Sister   . Cancer Brother   . Diabetes Son   . Diabetes Other     ALLERGIES:  Allergies  Allergen Reactions  . Ace Inhibitors     REACTION: cough  . Oxycodone-Acetaminophen   . Penicillins      PERTINENT MEDICATIONS:  Outpatient Encounter Medications as of 10/21/2020  Medication Sig  . aspirin EC 81 MG tablet Take 81 mg by mouth daily.  Marland Kitchen atorvastatin (LIPITOR) 10 MG tablet Take 1 tablet (10 mg total) by mouth daily.  . Blood Glucose Monitoring Suppl (FREESTYLE FREEDOM LITE) W/DEVICE KIT Use to check blood sugars daily Dx E11.8  . cholecalciferol (VITAMIN D) 1000 units tablet Take 1,000 Units by mouth daily.  . Colesevelam HCl 3.75 g PACK MIX THE CONTENTS OF 1 PACKET IN FLUID AND DRINK DAILY  . cyanocobalamin 1000 MCG tablet Take 1,000  mcg by mouth daily.  . Dietary Management Product (VASCULERA) TABS Take 1 tablet by mouth daily.  Marland Kitchen glucose blood (FREESTYLE LITE) test strip USE AS INSTRUCTED TO CHECK BLOOD SUGAR TWICE A DAY  . Insulin Pen Needle (B-D ULTRAFINE III [place] PEN) 31G X 8 MM MISC Use to inject insulin daily. DX: E11.9  . Lancets (FREESTYLE) lancets Use to check blood sugars twice day Dx E11.8  . losartan (COZAAR) 25 MG tablet TAKE 1 TABLET DAILY  . metFORMIN (GLUCOPHAGE) 500 MG tablet Take 1 tablet (500 mg total) by mouth 2 (two) times daily with a meal.  . metoprolol  tartrate (LOPRESSOR) 25 MG tablet TAKE ONE-HALF (1/2) TABLET TWICE A DAY  . Multiple Vitamins-Minerals (PRESERVISION/LUTEIN) CAPS Take 1 capsule by mouth 2 (two) times daily.  . revefenacin (YUPELRI) 175 MCG/3ML nebulizer solution Take 3 mLs (175 mcg total) by nebulization daily.  Marland Kitchen torsemide (DEMADEX) 10 MG tablet Take 1 tablet (10 mg total) by mouth daily.  . Vibegron (GEMTESA) 75 MG TABS Take 1 tablet by mouth daily.   No facility-administered encounter medications on file as of 10/21/2020.     Thank you for the opportunity to participate in the care of Mr. Stanbrough. The palliative care team will continue to follow. Please call our office at [phone] if we can be of additional assistance.  Ezekiel Slocumb, NP, AGPCNP-C

## 2020-11-11 ENCOUNTER — Telehealth: Payer: Self-pay | Admitting: Internal Medicine

## 2020-11-11 ENCOUNTER — Other Ambulatory Visit: Payer: Self-pay | Admitting: Internal Medicine

## 2020-11-11 DIAGNOSIS — N3281 Overactive bladder: Secondary | ICD-10-CM

## 2020-11-11 MED ORDER — GEMTESA 75 MG PO TABS
1.0000 | ORAL_TABLET | Freq: Every day | ORAL | 1 refills | Status: DC
Start: 2020-11-11 — End: 2021-06-11

## 2020-11-11 NOTE — Telephone Encounter (Signed)
Patient/spouse calling to request refill for  Vibegron (GEMTESA) [place], [place] [address] AT [facility]

## 2020-11-30 ENCOUNTER — Telehealth: Payer: Self-pay | Admitting: Internal Medicine

## 2020-11-30 NOTE — Telephone Encounter (Signed)
Patient's wife Nicholas [place] called to see if he should continue to take metFORMIN (GLUCOPHAGE) 500 MG tablet. If so he needs a refill please.   Preferred pharmacy:  [facility] [place], [place] [address] AT [facility] Phone:  [phone]  Fax:  [phone]     Last OV: 1.24.22   Next OV: 4.25.22

## 2020-12-08 DIAGNOSIS — R350 Frequency of micturition: Secondary | ICD-10-CM | POA: Diagnosis not present

## 2020-12-09 ENCOUNTER — Other Ambulatory Visit: Payer: Self-pay

## 2020-12-09 ENCOUNTER — Other Ambulatory Visit: Payer: Medicare Other | Admitting: Student

## 2020-12-09 DIAGNOSIS — I5042 Chronic combined systolic (congestive) and diastolic (congestive) heart failure: Secondary | ICD-10-CM

## 2020-12-09 DIAGNOSIS — Z515 Encounter for palliative care: Secondary | ICD-10-CM | POA: Diagnosis not present

## 2020-12-09 DIAGNOSIS — F0280 Dementia in other diseases classified elsewhere without behavioral disturbance: Secondary | ICD-10-CM | POA: Diagnosis not present

## 2020-12-09 DIAGNOSIS — G301 Alzheimer's disease with late onset: Secondary | ICD-10-CM | POA: Diagnosis not present

## 2020-12-09 NOTE — Progress Notes (Signed)
[facility] Palliative Care Consult Note Telephone: [phone]  Fax: [phone]    Date of encounter: 12/09/20 PATIENT NAME: Nicholas [place] [address] [place] [address]   [phone] (home)  DOB: 1927/10/05 MRN: [id number] PRIMARY CARE PROVIDER:    [name], MD,  [place] [address] [phone]  REFERRING PROVIDER:   [name], MD [place],  [place] [address] [phone]  RESPONSIBLE PARTY:    Contact Information    Name Relation Home Work [place] Other [phone]     Antenucci,Curtis Pandora Leiter   [phone]       I met face to face with patient and family in the home. Palliative Care was asked to follow this patient by consultation request of  [name], MD to address advance care planning and complex medical decision making. This is a follow up visit.                                   ASSESSMENT AND PLAN / RECOMMENDATIONS:   Advance Care Planning/Goals of Care: Goals include to maximize quality of life, maintain current level of care and symptom management. Our advance care planning conversation included a discussion about:     The value and importance of advance care planning   Experiences with loved ones who have been seriously ill or have died   Exploration of personal, cultural or spiritual beliefs that might influence medical decisions   Exploration of goals of care in the event of a sudden injury or illness   Identification and preparation of a healthcare agent   Review and updating or creation of an  advance directive document   CODE STATUS: Full Code  Will fill out MOST form on next visit.   Symptom Management/Plan:  Combined diastolic and systolic Heart Failure, COPD w/ asthma-patient with shortness of breath with exertion, sometimes rest and edema to lower extremities. Continue Yupelri nebulizer as directed. Continue torsemide and metoprolol as  ordered for HF/hypertension. Monitor salt intake; low salt diet encouraged. Edema-continue torsemide daily as directed; elevate legs when sitting in recliner or bed, low salt diet encouraged.   Late onset Alzheimer's dementia-FAST score 5. Some cueing required; also has impaired hearing which limits. Increased difficulty ambulating; recommend continuing PT as directed with [facility]. Patient encouraged to use his cane when ambulating. U/A checked by [facility]; results pending. Reorient/redirect as needed.   Follow up Palliative Care Visit: Palliative care will continue to follow for complex medical decision making, advance care planning, and clarification of goals. Return 4 weeks or prn.  I spent 40 minutes providing this consultation. More than 50% of the time in this consultation was spent in counseling and care coordination.    PPS: 50%  HOSPICE ELIGIBILITY/DIAGNOSIS: TBD  Chief Complaint: Palliative Medicine follow up, dementia.  HISTORY OF PRESENT ILLNESS:  Nicholas [place] is a 87 y.o. year old male  with T2DM, late onset Alzheimer's dementia, COPD with asthma, chronic systolic and diastolic heart failure,  Vitamin B12 deficiency, hearing loss.   Patient reports doing okay. He denies pain, chest pain; endorses shortness of breath with exertion. Lower extremity edema reported; has been elevating his legs. No hypoglycemic episodes reported. AM blood sugars usually around $RemoveBefore'130mg'dfdYNPBXijaJq$ /dL; evening blood sugars around $RemoveBefor'200mg'dZBeYAgazrAc$ /dL. He does report more difficulty ambulating. He is receiving PT with [facility]. No  recent falls/injury. Occasionally uses cane for ambulation. Started zyrtec for allergy symptoms. Increased confusion had been reported earlier in the week; urine was to be checked by [facility]. No concerns expressed today. Upcoming appointments this month with PCP and ophthalmologist.   History obtained from review of EMR, discussion with primary team, and interview with family,  facility staff/caregiver and/or Mr. Narine.  I reviewed available labs, medications, imaging, studies and related documents from the EMR.  Records reviewed and summarized above.   ROS  General: NAD EYES: wears glasses; worsening vision  ENMT: denies dysphagia Cardiovascular: denies chest pain, denies DOE Pulmonary: denies cough, denies increased SOB Abdomen: endorses good appetite, denies constipation GU: denies dysuria, endorses continence of urine MSK: denies weakness, no falls reported Skin: denies rashes or wounds Neurological: denies pain, denies insomnia Psych: Endorses positive mood Heme/lymph/immuno: denies bruises, abnormal bleeding  Physical Exam:  Pulse 72, resp 16, b/p 118/62, sats 98% on room air Constitutional: NAD General: well groomed EYES: anicteric sclera, lids intact, no discharge  ENMT: HOH with bilateral hearing aids, oral mucous membranes moist, dentition intact CV: S1S2, RRR, no LE edema Pulmonary: LCTA, no increased work of breathing, no cough, trace edema to BLE Abdomen: intake 100%, normo-active BS + 4 quadrants, soft and non tender GU: deferred MSK: moves all extremities, ambulatory Skin: warm and dry, no rashes or wounds on visible skin Neuro:  no generalized weakness Psych: non-anxious affect, A and O x 3 Hem/lymph/immuno: no widespread bruising   Thank you for the opportunity to participate in the care of Mr. Micciche.  The palliative care team will continue to follow. Please call our office at [phone] if we can be of additional assistance.   Ezekiel Slocumb, NP   COVID-19 PATIENT SCREENING TOOL Asked and negative response unless otherwise noted:   Have you had symptoms of covid, tested positive or been in contact with someone with symptoms/positive test in the past 5-10 days? No

## 2020-12-18 ENCOUNTER — Other Ambulatory Visit: Payer: Self-pay

## 2020-12-21 ENCOUNTER — Other Ambulatory Visit: Payer: Self-pay

## 2020-12-21 ENCOUNTER — Ambulatory Visit (INDEPENDENT_AMBULATORY_CARE_PROVIDER_SITE_OTHER): Payer: Medicare Other | Admitting: Internal Medicine

## 2020-12-21 ENCOUNTER — Encounter: Payer: Self-pay | Admitting: Internal Medicine

## 2020-12-21 VITALS — BP 136/76 | HR 69 | Temp 98.3°F | Ht 69.0 in | Wt 199.0 lb

## 2020-12-21 DIAGNOSIS — N183 Chronic kidney disease, stage 3 unspecified: Secondary | ICD-10-CM

## 2020-12-21 DIAGNOSIS — I10 Essential (primary) hypertension: Secondary | ICD-10-CM | POA: Diagnosis not present

## 2020-12-21 DIAGNOSIS — D508 Other iron deficiency anemias: Secondary | ICD-10-CM | POA: Diagnosis not present

## 2020-12-21 DIAGNOSIS — E538 Deficiency of other specified B group vitamins: Secondary | ICD-10-CM

## 2020-12-21 DIAGNOSIS — E118 Type 2 diabetes mellitus with unspecified complications: Secondary | ICD-10-CM

## 2020-12-21 LAB — BASIC METABOLIC PANEL
BUN: 22 mg/dL (ref 6–23)
CO2: 26 mEq/L (ref 19–32)
Calcium: 8.2 mg/dL — ABNORMAL LOW (ref 8.4–10.5)
Chloride: 109 mEq/L (ref 96–112)
Creatinine, Ser: 1.67 mg/dL — ABNORMAL HIGH (ref 0.40–1.50)
GFR: 35.69 mL/min — ABNORMAL LOW (ref >60.00)
Glucose, Bld: 113 mg/dL — ABNORMAL HIGH (ref 70–99)
Potassium: 4.2 mEq/L (ref 3.5–5.1)
Sodium: 141 mEq/L (ref 135–145)

## 2020-12-21 LAB — CBC WITH DIFFERENTIAL/PLATELET
Basophils Absolute: 0 10*3/uL (ref 0.0–0.1)
Basophils Relative: 0.4 % (ref 0.0–3.0)
Eosinophils Absolute: 0.2 10*3/uL (ref 0.0–0.7)
Eosinophils Relative: 2.6 % (ref 0.0–5.0)
HCT: 35.9 % — ABNORMAL LOW (ref 39.0–52.0)
Hemoglobin: 11.8 g/dL — ABNORMAL LOW (ref 13.0–17.0)
Lymphocytes Relative: 31.5 % (ref 12.0–46.0)
Lymphs Abs: 2.7 10*3/uL (ref 0.7–4.0)
MCHC: 33 g/dL (ref 30.0–36.0)
MCV: 86.5 fl (ref 78.0–100.0)
Monocytes Absolute: 1 10*3/uL (ref 0.1–1.0)
Monocytes Relative: 11.5 % (ref 3.0–12.0)
Neutro Abs: 4.7 10*3/uL (ref 1.4–7.7)
Neutrophils Relative %: 54 % (ref 43.0–77.0)
Platelets: 194 10*3/uL (ref 150.0–400.0)
RBC: 4.15 Mil/uL — ABNORMAL LOW (ref 4.22–5.81)
RDW: 15.3 % (ref 11.5–15.5)
WBC: 8.7 10*3/uL (ref 4.0–10.5)

## 2020-12-21 LAB — HEMOGLOBIN A1C: Hgb A1c MFr Bld: 7.1 % — ABNORMAL HIGH (ref 4.6–6.5)

## 2020-12-21 MED ORDER — FREESTYLE LIBRE 2 READER DEVI: 1.0000 | Freq: Every day | 5 refills | Status: DC

## 2020-12-21 MED ORDER — METFORMIN HCL 500 MG PO TABS
500.0000 mg | ORAL_TABLET | Freq: Two times a day (BID) | ORAL | 0 refills | Status: DC
Start: 2020-12-21 — End: 2021-03-19

## 2020-12-21 MED ORDER — FREESTYLE LIBRE 2 SENSOR MISC: 1.0000 | Freq: Every day | 5 refills | Status: DC

## 2020-12-21 MED ORDER — GVOKE HYPOPEN 2-PACK 1 MG/0.2ML ~~LOC~~ SOAJ: 1.0000 | Freq: Every day | SUBCUTANEOUS | 6 refills | Status: DC | PRN

## 2020-12-21 MED ORDER — CYANOCOBALAMIN 1000 MCG/ML IJ SOLN
1000.0000 ug | Freq: Once | INTRAMUSCULAR | Status: AC
  Administered 2020-12-21: 1000 ug via INTRAMUSCULAR

## 2020-12-21 NOTE — Patient Instructions (Signed)
Type 2 Diabetes Mellitus, Diagnosis, Adult Type 2 diabetes (type 2 diabetes mellitus) is a long-term, or chronic, disease. In type 2 diabetes, one or both of these problems may be present:  The pancreas does not make enough of a hormone called insulin.  Cells in the body do not respond properly to insulin that the body makes (insulin resistance). Normally, insulin allows blood sugar (glucose) to enter cells in the body. The cells use glucose for energy. Insulin resistance or lack of insulin causes excess glucose to build up in the blood instead of going into cells. This causes high blood glucose (hyperglycemia).  What are the causes? The exact cause of type 2 diabetes is not known. What increases the risk? The following factors may make you more likely to develop this condition:  Having a family member with type 2 diabetes.  Being overweight or obese.  Being inactive (sedentary).  Having been diagnosed with insulin resistance.  Having a history of prediabetes, diabetes when you were pregnant (gestational diabetes), or polycystic ovary syndrome (PCOS). What are the signs or symptoms? In the early stage of this condition, you may not have symptoms. Symptoms develop slowly and may include:  Increased thirst or hunger.  Increased urination.  Unexplained weight loss.  Tiredness (fatigue) or weakness.  Vision changes, such as blurry vision.  Dark patches on the skin. How is this diagnosed? This condition is diagnosed based on your symptoms, your medical history, a physical exam, and your blood glucose level. Your blood glucose may be checked with one or more of the following blood tests:  A fasting blood glucose (FBG) test. You will not be allowed to eat (you will fast) for 8 hours or longer before a blood sample is taken.  A random blood glucose test. This test checks blood glucose at any time of day regardless of when you ate.  An A1C (hemoglobin A1C) blood test. This test  provides information about blood glucose levels over the previous 2-3 months.  An oral glucose tolerance test (OGTT). This test measures your blood glucose at two times: ? After fasting. This is your baseline blood glucose level. ? Two hours after drinking a beverage that contains glucose. You may be diagnosed with type 2 diabetes if:  Your fasting blood glucose level is 126 mg/dL (7.0 mmol/L) or higher.  Your random blood glucose level is 200 mg/dL (11.1 mmol/L) or higher.  Your A1C level is 6.5% or higher.  Your oral glucose tolerance test result is higher than 200 mg/dL (11.1 mmol/L). These blood tests may be repeated to confirm your diagnosis.   How is this treated? Your treatment may be managed by a specialist called an endocrinologist. Type 2 diabetes may be treated by following instructions from your health care provider about:  Making dietary and lifestyle changes. These may include: ? Following a personalized nutrition plan that is developed by a registered dietitian. ? Exercising regularly. ? Finding ways to manage stress.  Checking your blood glucose level as often as told.  Taking diabetes medicines or insulin daily. This helps to keep your blood glucose levels in the healthy range.  Taking medicines to help prevent complications from diabetes. Medicines may include: ? Aspirin. ? Medicine to lower cholesterol. ? Medicine to control blood pressure. Your health care provider will set treatment goals for you. Your goals will be based on your age, other medical conditions you have, and how you respond to diabetes treatment. Generally, the goal of treatment is to maintain the   following blood glucose levels:  Before meals: 80-130 mg/dL (4.4-7.2 mmol/L).  After meals: below 180 mg/dL (10 mmol/L).  A1C level: less than 7%. Follow these instructions at home: Questions to ask your health care provider Consider asking the following questions:  Should I meet with a certified  diabetes care and education specialist?  What diabetes medicines do I need, and when should I take them?  What equipment will I need to manage my diabetes at home?  How often do I need to check my blood glucose?  Where can I find a support group for people with diabetes?  What number can I call if I have questions?  When is my next appointment? General instructions  Take over-the-counter and prescription medicines only as told by your health care provider.  Keep all follow-up visits as told by your health care provider. This is important. Where to find more information  American Diabetes Association (ADA): www.diabetes.org  American Association of Diabetes Care and Education Specialists (ADCES): www.diabeteseducator.org  International Diabetes Federation (IDF): www.idf.org Contact a health care provider if:  Your blood glucose is at or above 240 mg/dL (13.3 mmol/L) for 2 days in a row.  You have been sick or have had a fever for 2 days or longer, and you are not getting better.  You have any of the following problems for more than 6 hours: ? You cannot eat or drink. ? You have nausea and vomiting. ? You have diarrhea. Get help right away if:  You have severe hypoglycemia. This means your blood glucose is lower than 54 mg/dL (3.0 mmol/L).  You become confused or you have trouble thinking clearly.  You have difficulty breathing.  You have moderate or large ketone levels in your urine. These symptoms may represent a serious problem that is an emergency. Do not wait to see if the symptoms will go away. Get medical help right away. Call your local emergency services (911 in the [place] Do not drive yourself to the hospital. Summary  Type 2 diabetes (type 2 diabetes mellitus) is a long-term, or chronic, disease. In type 2 diabetes, the pancreas does not make enough of a hormone called insulin, or cells in the body do not respond properly to insulin that the body makes (insulin  resistance).  This condition is treated by making dietary and lifestyle changes and taking diabetes medicines or insulin.  Your health care provider will set treatment goals for you. Your goals will be based on your age, other medical conditions you have, and how you respond to diabetes treatment.  Keep all follow-up visits as told by your health care provider. This is important. This information is not intended to replace advice given to you by your health care provider. Make sure you discuss any questions you have with your health care provider. Document Revised: 03/11/2020 Document Reviewed: 03/11/2020 Elsevier Patient Education  2021 Elsevier Inc.  

## 2020-12-21 NOTE — Progress Notes (Addendum)
Subjective:  Patient ID: Nicholas [place], male    DOB: Jul 19, 1928  Age: 87 y.o. MRN: [id number]  CC: Anemia  This visit occurred during the SARS-CoV-2 public health emergency.  Safety protocols were in place, including screening questions prior to the visit, additional usage of staff PPE, and extensive cleaning of exam room while observing appropriate contact time as indicated for disinfecting solutions.    HPI Nicholas [place] presents for f/up - His D is with him today. He had a episode of symptomatic hypoglycemia on Easter Sunday. This responded well to an oral load of glucose. He wants to start using a CGM.  Outpatient Medications Prior to Visit  Medication Sig Dispense Refill  . aspirin EC 81 MG tablet Take 81 mg by mouth daily.    Marland Kitchen atorvastatin (LIPITOR) 10 MG tablet Take 1 tablet (10 mg total) by mouth daily. 90 tablet 1  . Blood Glucose Monitoring Suppl (FREESTYLE FREEDOM LITE) W/DEVICE KIT Use to check blood sugars daily Dx E11.8 1 each 0  . cholecalciferol (VITAMIN D) 1000 units tablet Take 1,000 Units by mouth daily.    . Colesevelam HCl 3.75 g PACK MIX THE CONTENTS OF 1 PACKET IN FLUID AND DRINK DAILY 90 packet 3  . cyanocobalamin 1000 MCG tablet Take 1,000 mcg by mouth daily.    . Dietary Management Product (VASCULERA) TABS Take 1 tablet by mouth daily. 30 tablet 11  . glucose blood (FREESTYLE LITE) test strip USE AS INSTRUCTED TO CHECK BLOOD SUGAR TWICE A DAY 200 each 3  . Insulin Pen Needle (B-D ULTRAFINE III [place] PEN) 31G X 8 MM MISC Use to inject insulin daily. DX: E11.9 300 each 3  . Lancets (FREESTYLE) lancets Use to check blood sugars twice day Dx E11.8 180 each 3  . losartan (COZAAR) 25 MG tablet TAKE 1 TABLET DAILY 90 tablet 3  . metoprolol tartrate (LOPRESSOR) 25 MG tablet TAKE ONE-HALF (1/2) TABLET TWICE A DAY 90 tablet 3  . Multiple Vitamins-Minerals (PRESERVISION/LUTEIN) CAPS Take 1 capsule by mouth 2 (two) times daily.    . revefenacin (YUPELRI) 175 MCG/3ML  nebulizer solution Take 3 mLs (175 mcg total) by nebulization daily. 270 mL 1  . torsemide (DEMADEX) 10 MG tablet Take 1 tablet (10 mg total) by mouth daily. 90 tablet 1  . Vibegron (GEMTESA) 75 MG TABS Take 1 tablet by mouth daily. 90 tablet 1  . metFORMIN (GLUCOPHAGE) 500 MG tablet Take 1 tablet (500 mg total) by mouth 2 (two) times daily with a meal. 180 tablet 0   No facility-administered medications prior to visit.    ROS Review of Systems  Constitutional: Negative for chills, diaphoresis, fatigue and fever.  HENT: Negative.   Eyes: Negative for visual disturbance.  Respiratory: Negative for cough, chest tightness, shortness of breath and wheezing.   Cardiovascular: Negative for chest pain, palpitations and leg swelling.  Gastrointestinal: Negative for abdominal pain, diarrhea, nausea and vomiting.  Endocrine: Negative.  Negative for polydipsia, polyphagia and polyuria.  Genitourinary: Negative.  Negative for difficulty urinating.  Musculoskeletal: Negative.  Negative for arthralgias and myalgias.  Skin: Negative.   Neurological: Negative.  Negative for weakness.  Hematological: Negative for adenopathy. Does not bruise/bleed easily.  Psychiatric/Behavioral: Negative.     Objective:  BP 136/76   Pulse 69   Temp 98.3 F (36.8 C) (Oral)   Ht 5' 9"  (1.753 m)   Wt 199 lb (90.3 kg)   SpO2 99%   BMI 29.39 kg/m   BP  Readings from Last 3 Encounters:  12/21/20 136/76  09/21/20 (!) 142/82  04/21/20 128/66    Wt Readings from Last 3 Encounters:  12/21/20 199 lb (90.3 kg)  09/21/20 194 lb (88 kg)  04/21/20 198 lb 3.2 oz (89.9 kg)    Physical Exam Vitals reviewed.  Constitutional:      Appearance: He is not ill-appearing.  HENT:     Nose: Nose normal.     Mouth/Throat:     Mouth: Mucous membranes are moist.  Eyes:     General: No scleral icterus.    Conjunctiva/sclera: Conjunctivae normal.  Cardiovascular:     Rate and Rhythm: Normal rate and regular rhythm.      Heart sounds: No murmur heard.   Pulmonary:     Effort: Pulmonary effort is normal.     Breath sounds: No stridor. No wheezing, rhonchi or rales.  Abdominal:     General: Abdomen is flat. Bowel sounds are normal. There is no distension.     Palpations: Abdomen is soft. There is no hepatomegaly, splenomegaly or mass.     Tenderness: There is no abdominal tenderness.  Musculoskeletal:        General: Normal range of motion.     Cervical back: Neck supple.     Right lower leg: Edema (trace pitting) present.     Left lower leg: Edema (trace pitting) present.  Lymphadenopathy:     Cervical: No cervical adenopathy.  Skin:    General: Skin is warm and dry.     Findings: No rash.  Neurological:     General: No focal deficit present.     Mental Status: He is alert.  Psychiatric:        Mood and Affect: Mood normal.        Behavior: Behavior normal.     Lab Results  Component Value Date   WBC 8.7 12/21/2020   HGB 11.8 (L) 12/21/2020   HCT 35.9 (L) 12/21/2020   PLT 194.0 12/21/2020   GLUCOSE 113 (H) 12/21/2020   CHOL 83 09/17/2019   TRIG 136.0 09/17/2019   HDL 36.40 (L) 09/17/2019   LDLCALC 19 09/17/2019   ALT 13 09/17/2019   AST 17 09/17/2019   NA 141 12/21/2020   K 4.2 12/21/2020   CL 109 12/21/2020   CREATININE 1.67 (H) 12/21/2020   BUN 22 12/21/2020   CO2 26 12/21/2020   TSH 4.25 09/17/2019   PSA 11.21 (H) 10/30/2017   INR 1.3 ratio (H) 08/11/2010   HGBA1C 7.1 (H) 12/21/2020   MICROALBUR 0.7 09/17/2019    DG Chest 2 View  Result Date: 05/16/2019 CLINICAL DATA:  Cough and wheezing for 2 days. EXAM: CHEST - 2 VIEW COMPARISON:  02/05/2016. FINDINGS: Cardiac silhouette is normal in size. No mediastinal or hilar masses. No evidence of adenopathy. Clear lungs.  No pleural effusion or pneumothorax. Skeletal structures are intact. IMPRESSION: No active cardiopulmonary disease. Electronically Signed   By: Lajean Manes M.D.   On: 05/16/2019 14:38    Assessment & Plan:    Diamante was seen today for anemia.  Diagnoses and all orders for this visit:  B12 deficiency- His H&H are stable. -     cyanocobalamin ((VITAMIN B-12)) injection 1,000 mcg -     CBC with Differential/Platelet; Future -     CBC with Differential/Platelet -     CBC with Differential/Platelet; Future  Iron deficiency anemia secondary to inadequate dietary iron intake- His H&H are stable. -  CBC with Differential/Platelet; Future -     CBC with Differential/Platelet -     CBC with Differential/Platelet; Future  CRI (chronic renal insufficiency), stage 3 (moderate)- His renal function is stable.  Will continue to maintain control of his blood pressure and blood sugar.  He is avoiding nephrotoxic agents. -     Basic metabolic panel; Future -     Basic metabolic panel  Type II diabetes mellitus with manifestations ([place])- His A1c is at 7.1%.  He has had 1 episode of hypoglycemia.  He does not want to stop taking or decrease the dose of the basal insulin.  I recommended that he use a CGM and a glucagon pen as needed. -     metFORMIN (GLUCOPHAGE) 500 MG tablet; Take 1 tablet (500 mg total) by mouth 2 (two) times daily with a meal. -     Glucagon (GVOKE HYPOPEN 2-PACK) 1 MG/0.2ML SOAJ; Inject 1 Act into the skin daily as needed. -     Continuous Blood Gluc Receiver (FREESTYLE LIBRE 2 READER) DEVI; 1 Act by Does not apply route daily. -     Continuous Blood Gluc Sensor (FREESTYLE LIBRE 2 SENSOR) MISC; 1 Act by Does not apply route daily. -     Basic metabolic panel; Future -     Hemoglobin A1c; Future -     Hemoglobin A1c -     Basic metabolic panel  Essential hypertension- His blood pressure is adequately well controlled. -     Basic metabolic panel; Future -     Basic metabolic panel   I am having [name]. Utz start on Gvoke HypoPen 2-Pack, FreeStyle Libre 2 Reader, and [employer]. I am also having him maintain his PreserVision/Lutein, FreeStyle Freedom Lite, freestyle,  cyanocobalamin, cholecalciferol, aspirin EC, B-D ULTRAFINE III [place] PEN, FREESTYLE LITE, Vasculera, Colesevelam HCl, losartan, metoprolol tartrate, atorvastatin, torsemide, revefenacin, Gemtesa, and metFORMIN. We administered cyanocobalamin.  Meds ordered this encounter  Medications  . metFORMIN (GLUCOPHAGE) 500 MG tablet    Sig: Take 1 tablet (500 mg total) by mouth 2 (two) times daily with a meal.    Dispense:  180 tablet    Refill:  0  . cyanocobalamin ((VITAMIN B-12)) injection 1,000 mcg  . Glucagon (GVOKE HYPOPEN 2-PACK) 1 MG/0.2ML SOAJ    Sig: Inject 1 Act into the skin daily as needed.    Dispense:  2 mL    Refill:  6  . Continuous Blood Gluc Receiver (FREESTYLE LIBRE 2 READER) DEVI    Sig: 1 Act by Does not apply route daily.    Dispense:  2 each    Refill:  5  . Continuous Blood Gluc Sensor (FREESTYLE LIBRE 2 SENSOR) MISC    Sig: 1 Act by Does not apply route daily.    Dispense:  2 each    Refill:  5     Follow-up: Return in about 6 months (around 06/22/2021).  Scarlette Calico, MD

## 2020-12-22 DIAGNOSIS — H353223 Exudative age-related macular degeneration, left eye, with inactive scar: Secondary | ICD-10-CM | POA: Diagnosis not present

## 2020-12-22 DIAGNOSIS — H3581 Retinal edema: Secondary | ICD-10-CM | POA: Diagnosis not present

## 2020-12-22 DIAGNOSIS — H43813 Vitreous degeneration, bilateral: Secondary | ICD-10-CM | POA: Diagnosis not present

## 2020-12-22 DIAGNOSIS — H353211 Exudative age-related macular degeneration, right eye, with active choroidal neovascularization: Secondary | ICD-10-CM | POA: Diagnosis not present

## 2020-12-28 ENCOUNTER — Other Ambulatory Visit: Payer: Self-pay | Admitting: Internal Medicine

## 2020-12-28 DIAGNOSIS — Z794 Long term (current) use of insulin: Secondary | ICD-10-CM

## 2020-12-28 DIAGNOSIS — E118 Type 2 diabetes mellitus with unspecified complications: Secondary | ICD-10-CM

## 2021-01-07 ENCOUNTER — Telehealth: Payer: Self-pay | Admitting: Internal Medicine

## 2021-01-07 NOTE — Telephone Encounter (Signed)
Continuous Blood Gluc Receiver (FREESTYLE LIBRE 2 READER) DEVI  Continuous Blood Gluc Sensor (FREESTYLE LIBRE 2 SENSOR) MISC  Patients wife calling, states that this needs a prior [place]

## 2021-01-07 NOTE — Telephone Encounter (Signed)
disregard

## 2021-01-20 ENCOUNTER — Other Ambulatory Visit: Payer: Medicare Other | Admitting: Student

## 2021-01-20 ENCOUNTER — Other Ambulatory Visit: Payer: Self-pay

## 2021-01-20 DIAGNOSIS — R0602 Shortness of breath: Secondary | ICD-10-CM | POA: Diagnosis not present

## 2021-01-20 DIAGNOSIS — R600 Localized edema: Secondary | ICD-10-CM

## 2021-01-20 DIAGNOSIS — Z515 Encounter for palliative care: Secondary | ICD-10-CM

## 2021-01-20 DIAGNOSIS — G301 Alzheimer's disease with late onset: Secondary | ICD-10-CM | POA: Diagnosis not present

## 2021-01-20 DIAGNOSIS — F0280 Dementia in other diseases classified elsewhere without behavioral disturbance: Secondary | ICD-10-CM | POA: Diagnosis not present

## 2021-01-20 DIAGNOSIS — E118 Type 2 diabetes mellitus with unspecified complications: Secondary | ICD-10-CM | POA: Diagnosis not present

## 2021-01-20 NOTE — Progress Notes (Signed)
[facility] Palliative Care Consult Note Telephone: [phone]  Fax: [phone]    Date of encounter: 01/20/21 PATIENT NAME: Nicholas Burton [address] [place] [address]   [phone] (home)  DOB: August 24, 1928 MRN: [id number] PRIMARY CARE PROVIDER:    [name], MD,  [place] [address] [phone]  REFERRING PROVIDER:   [name], MD [place],  [place] [address] [phone]  RESPONSIBLE PARTY:    Contact Information    Name Relation Home Work Nicholas Burton [phone]     Nicholas Burton,Nicholas Burton Nicholas Burton   [phone]       I met face to face with patient and family in the home. Palliative Care was asked to follow this patient by consultation request of  [name], MD to address advance care planning and complex medical decision making. This is a follow up visit.                                   ASSESSMENT AND PLAN / RECOMMENDATIONS:   Advance Care Planning/Goals of Care: Goals include to maximize quality of life and symptom management. Patient would like to remain in the home for as long as possible. Our advance care planning conversation included a discussion about:     The value and importance of advance care planning   Experiences with loved ones who have been seriously ill or have died   Exploration of personal, cultural or spiritual beliefs that might influence medical decisions   Exploration of goals of care in the event of a sudden injury or illness   Reviewed MOST form; patient is still uncertain with his wishes and would like to review further.  CODE STATUS: Full Code  Symptom Management/Plan:  Combined diastolic and systolic Heart Failure, COPD w/ asthma-patient with shortness of breath with exertion, sometimes rest and edema to lower extremities. Continue Yupelri nebulizer as directed. Continue torsemide and metoprolol as ordered for HF/hypertension.  Monitor salt intake; low salt diet encouraged. Edema-continue torsemide daily as directed; elevate legs when sitting in recliner or bed, low salt diet encouraged.   Late onset Alzheimer's dementia-FAST score 5. Some cueing required; also has impaired hearing which limits.  Patient encouraged to use his cane when ambulating. Reorient/redirect as needed.   Type 2 Diabetes-patient with hypoglycemic episode ths morning, prior to falling. Last A1C is 7.1%. Patient encouraged to eat snack before bed and first thing in AM as he tends to eat a late breakfast. Continue metformin 564m BID as directed. Glucagon PRN. Continue to check blood sugars twice daily.   Pain-patient reports generalized pain s/p fall this AM. Administer acetaminophen 5014mevery 6 hours PRN. May apply ice pack to right knee for up to 20 minutes TID PRN.    Follow up Palliative Care Visit: Palliative care will continue to follow for complex medical decision making, advance care planning, and clarification of goals. Return in 6 weeks or prn.  I spent 40 minutes providing this consultation. More than 50% of the time in this consultation was spent in counseling and care coordination.   PPS: 50%  HOSPICE ELIGIBILITY/DIAGNOSIS: TBD  Chief Complaint: Palliative Medicine follow up visit, dementia.  HISTORY OF PRESENT ILLNESS:  MaCLIVE PARCELs a [age].o. year old male  with late onset Alzheimer's dementia, COPD with asthma, chronic systolic and diastolic heart failure, T2F1MB  Vitamin B12 deficiency, hearing loss.  Patient had a fall this morning. Wife called EMS, but patient had gotten himself out of floor by the time they arrived. Patient denies hitting his head or losing consciousness. He does report mild pain, stating pain is a 1-2/10. Wife is concerned about right knee as he had had surgery before. He did sustain skin tear to right elbow. Patient's blood sugar was 46m/dL this AM; he did take one glucagon tablet when EMS was present. He  reports later eating breakfast. He is checking blood sugar twice a day; AM blood sugars usually between 90-1764mdL. HS blood sugar usually in the 200's.     History obtained from review of EMR, discussion with primary team, and interview with family, facility staff/caregiver and/or Nicholas Burton.  I reviewed available labs, medications, imaging, studies and related documents from the EMR.  Records reviewed and summarized above.   ROS  General: NAD EYES: wears glasses, worsening vision ENMT: denies dysphagia Cardiovascular: denies chest pain, DOE Pulmonary: denies cough, denies increased SOB Abdomen: endorses good appetite, denies constipation, endorses continence of bowel GU: denies dysuria MSK: weakness Skin: skin tear to right elbow Neurological: denies insomnia Psych: Endorses positive mood Heme/lymph/immuno: denies bruises, abnormal bleeding  Physical Exam: Pulse 72, resp 20, b/p 120/68, sats 99% on room air Constitutional: NAD General: frail appearing EYES: anicteric sclera, lids intact, no discharge  ENMT: intact hearing, oral mucous membranes moist, dentition intact CV: S1S2, RRR, trace LE edema Pulmonary: LCTA, no increased work of breathing, no cough Abdomen: normo-active BS + 4 quadrants, soft and non tender GU: deferred MSK: ROM to all extremities, ambulatory with cane, gait steady Skin: warm and dry, no rashes, bandage to right elbow, abrasion to right knee Neuro: generalized weakness Psych: non-anxious affect, A & O x 3 Hem/lymph/immuno: no widespread bruising   Thank you for the opportunity to participate in the care of Nicholas Burton.  The palliative care team will continue to follow. Please call our office at [phone] we can be of additional assistance.   Nicholas SlocumbNP   COVID-19 PATIENT SCREENING TOOL Asked and negative response unless otherwise noted:   Have you had symptoms of covid, tested positive or been in contact with someone with  symptoms/positive test in the past 5-10 days? No

## 2021-02-12 DIAGNOSIS — E118 Type 2 diabetes mellitus with unspecified complications: Secondary | ICD-10-CM | POA: Diagnosis not present

## 2021-02-12 DIAGNOSIS — I5042 Chronic combined systolic (congestive) and diastolic (congestive) heart failure: Secondary | ICD-10-CM | POA: Diagnosis not present

## 2021-02-12 DIAGNOSIS — J449 Chronic obstructive pulmonary disease, unspecified: Secondary | ICD-10-CM | POA: Diagnosis not present

## 2021-02-12 DIAGNOSIS — R591 Generalized enlarged lymph nodes: Secondary | ICD-10-CM | POA: Diagnosis not present

## 2021-02-12 DIAGNOSIS — M1990 Unspecified osteoarthritis, unspecified site: Secondary | ICD-10-CM | POA: Diagnosis not present

## 2021-02-24 ENCOUNTER — Ambulatory Visit: Payer: Self-pay

## 2021-02-24 NOTE — Chronic Care Management (AMB) (Signed)
   02/24/2021  DIMITRIOS BALESTRIERI 1928/07/05 [id number]  Previously enrolled patient for remote health.  Placed call to patient and spoke with wife, [name], due to patients dementia.  Reviewed reason for call.  Wife reports patient is doing well and they are active with palliative care. Declines services at this time but will discuss with PCP at next office visit.  Tomasa Rand, RN, BSN, CEN [facility] [employer] [phone]

## 2021-02-25 ENCOUNTER — Other Ambulatory Visit: Payer: Self-pay | Admitting: Internal Medicine

## 2021-02-25 DIAGNOSIS — E118 Type 2 diabetes mellitus with unspecified complications: Secondary | ICD-10-CM

## 2021-03-02 DIAGNOSIS — H353223 Exudative age-related macular degeneration, left eye, with inactive scar: Secondary | ICD-10-CM | POA: Diagnosis not present

## 2021-03-02 DIAGNOSIS — H353211 Exudative age-related macular degeneration, right eye, with active choroidal neovascularization: Secondary | ICD-10-CM | POA: Diagnosis not present

## 2021-03-02 DIAGNOSIS — H43813 Vitreous degeneration, bilateral: Secondary | ICD-10-CM | POA: Diagnosis not present

## 2021-03-03 ENCOUNTER — Other Ambulatory Visit: Payer: Medicare Other | Admitting: Student

## 2021-03-03 ENCOUNTER — Other Ambulatory Visit: Payer: Self-pay

## 2021-03-03 DIAGNOSIS — R600 Localized edema: Secondary | ICD-10-CM | POA: Diagnosis not present

## 2021-03-03 DIAGNOSIS — E118 Type 2 diabetes mellitus with unspecified complications: Secondary | ICD-10-CM | POA: Diagnosis not present

## 2021-03-03 DIAGNOSIS — R0602 Shortness of breath: Secondary | ICD-10-CM | POA: Diagnosis not present

## 2021-03-03 DIAGNOSIS — F0280 Dementia in other diseases classified elsewhere without behavioral disturbance: Secondary | ICD-10-CM | POA: Diagnosis not present

## 2021-03-03 DIAGNOSIS — Z515 Encounter for palliative care: Secondary | ICD-10-CM

## 2021-03-03 DIAGNOSIS — G301 Alzheimer's disease with late onset: Secondary | ICD-10-CM | POA: Diagnosis not present

## 2021-03-03 NOTE — Progress Notes (Signed)
[facility] Pall[facility] Consult Note Telephone: [phone]  Fax: [phone]    Date of encounter: 03/03/21 PATIENT NAME: Nicholas Burton [address] [place] [address]   [phone] (home)  DOB: 1928-08-02 MRN: [id number] PRIMARY CARE PROVIDER:    [name], MD,  [place] [address] [phone]  REFERRING PROVIDER:   [name], MD [place],  [place] [address] [phone]  RESPONSIBLE PARTY:    Contact Information     Name Relation Home Work [place] Other [phone]     Ly[place],Nicholas Burton   [phone]        I met face to face with patient and family in the home. Palliative Care was asked to follow this patient by consultation request of  [name], MD to address advance care planning and complex medical decision making. This is a follow up visit.                                   ASSESSMENT AND PLAN / RECOMMENDATIONS:   Advance Care Planning/Goals of Care: Goals include to maximize quality of life and symptom management. Our advance care planning conversation included a discussion about:    The value and importance of advance care planning  Experiences with loved ones who have been seriously ill or have died  Exploration of personal, cultural or spiritual beliefs that might influence medical decisions  Exploration of goals of care in the event of a sudden injury or illness  Identification and preparation of a healthcare agent  Ongoing discussion to fill out MOST form. CODE STATUS: Full Code  Symptom Management/Plan:  Combined diastolic and systolic Heart Failure, COPD w/ asthma-patient with shortness of breath with exertion, edema to lower extremities. Continue Yupelri nebulizer as directed. Continue torsemide and metoprolol as ordered for HF/hypertension. Monitor salt intake; low salt diet encouraged. Edema-continue torsemide daily as directed; elevate  legs when sitting in recliner or bed, low salt diet encouraged.    Late onset Alzheimer's dementia-FAST score 5. Some cueing required; also has impaired hearing which limits.  Patient encouraged to use his cane when ambulating. Reorient/redirect as needed.   Type 2 Diabetes-patient with hypoglycemic episode ths morning, prior to falling. Last A1C is 7.1%. Continue snack before bed and first thing in AM as he tends to eat a late breakfast. No recent hypoglycemic episodes. Continue metformin 540m BID as directed , Lantus 30 units QHS. Glucagon PRN. Continue to check blood sugars twice daily.    Follow up Palliative Care Visit: Palliative care will continue to follow for complex medical decision making, advance care planning, and clarification of goals. Return IN 8 weeks or prn.  I spent 40 minutes providing this consultation. More than 50% of the time in this consultation was spent in counseling and care coordination.    PPS: 50%  HOSPICE ELIGIBILITY/DIAGNOSIS: TBD  Chief Complaint: Palliative Medicine follow up visit.   HISTORY OF PRESENT ILLNESS:  Nicholas CERNEYis a 87y.o. year old male  with  T2DM, late onset Alzheimer's dementia, COPD with asthma, chronic systolic and diastolic heart failure,  Vitamin B12 deficiency, hearing loss.  Patient reports doing well. Endorses good appetite. Blood sugars have been managed better. No hypoglycemic episodes, eating HS snack. Denies pain, shortness of breath at rest; endorses shortness of breath with exertion. No recent falls. Uses  cane for ambulation. Received injection to right eye yesterday at retinal specialist. F/u in 10 weeks. Sleeping well at night.   History obtained from review of EMR, discussion with primary team, and interview with family, facility staff/caregiver and/or Nicholas Burton.  I reviewed available labs, medications, imaging, studies and related documents from the EMR.  Records reviewed and summarized above.   ROS  General:  NAD EYES: denies vision changes ENMT: denies dysphagia Cardiovascular: denies chest pain Pulmonary: denies cough, SOB w/exertion Abdomen: endorses good appetite, denies constipation GU: denies dysuria MSK:  denies weakness, no falls reported since last visit Skin: denies rashes or wounds Neurological: denies pain, denies insomnia Psych: Endorses positive mood Heme/lymph/immuno: denies bruises, abnormal bleeding  Physical Exam: Pulse 76, resp 20, b/p 130/72, sats 98% on room air Constitutional: NAD General: frail appearing, EYES: anicteric sclera, lids intact, no discharge  ENMT: HOH w/hearing aids, oral mucous membranes moist, dentition intact CV: S1S2, RRR, trace LE edema Pulmonary: LCTA, no increased work of breathing, no cough, room air Abdomen: normo-active BS + 4 quadrants, soft and non tender GU: deferred MSK: no sarcopenia, moves all extremities, ambulatory with cane, gait steady Skin: warm and dry, no rashes or wounds on visible skin Neuro: generalized weakness, A & O x 3, some forgetfulness. Psych: non-anxious affect Hem/lymph/immuno: no widespread bruising   Thank you for the opportunity to participate in the care of Nicholas Burton.  The palliative care team will continue to follow. Please call our office at [phone] if we can be of additional assistance.   Ezekiel Slocumb, NP   COVID-19 PATIENT SCREENING TOOL Asked and negative response unless otherwise noted:   Have you had symptoms of covid, tested positive or been in contact with someone with symptoms/positive test in the past 5-10 days? No

## 2021-03-04 ENCOUNTER — Other Ambulatory Visit: Payer: Self-pay | Admitting: Internal Medicine

## 2021-03-09 DIAGNOSIS — L218 Other seborrheic dermatitis: Secondary | ICD-10-CM | POA: Diagnosis not present

## 2021-03-09 DIAGNOSIS — Z85820 Personal history of malignant melanoma of skin: Secondary | ICD-10-CM | POA: Diagnosis not present

## 2021-03-09 DIAGNOSIS — L905 Scar conditions and fibrosis of skin: Secondary | ICD-10-CM | POA: Diagnosis not present

## 2021-03-09 DIAGNOSIS — L819 Disorder of pigmentation, unspecified: Secondary | ICD-10-CM | POA: Diagnosis not present

## 2021-03-09 DIAGNOSIS — L821 Other seborrheic keratosis: Secondary | ICD-10-CM | POA: Diagnosis not present

## 2021-03-09 DIAGNOSIS — D229 Melanocytic nevi, unspecified: Secondary | ICD-10-CM | POA: Diagnosis not present

## 2021-03-09 DIAGNOSIS — Z85828 Personal history of other malignant neoplasm of skin: Secondary | ICD-10-CM | POA: Diagnosis not present

## 2021-03-09 DIAGNOSIS — L570 Actinic keratosis: Secondary | ICD-10-CM | POA: Diagnosis not present

## 2021-03-15 ENCOUNTER — Telehealth: Payer: Self-pay | Admitting: Internal Medicine

## 2021-03-15 ENCOUNTER — Other Ambulatory Visit: Payer: Self-pay | Admitting: Internal Medicine

## 2021-03-15 DIAGNOSIS — E118 Type 2 diabetes mellitus with unspecified complications: Secondary | ICD-10-CM

## 2021-03-15 MED ORDER — INSULIN GLARGINE 100 UNIT/ML ~~LOC~~ SOLN: 30.0000 [IU] | Freq: Every day | SUBCUTANEOUS | 1 refills | Status: DC

## 2021-03-15 NOTE — Telephone Encounter (Signed)
    1.Medication Requested:insulin glargine (LANTUS) 100 UNIT/ML injection  2. Pharmacy (Name, Street, [place]): please send to mail order and local  [place], [place] [facility] Phone:  [phone]  Fax:  [phone]    [facility] [place], [place]     3. On Med List: yes  4. Last Visit with PCP:  5. Next visit date with PCP:   Agent: Please be advised that RX refills may take up to 3 business days. We ask that you follow-up with your pharmacy.

## 2021-03-17 NOTE — Telephone Encounter (Signed)
[facility] calling to clarify Lantus order  Please call [phone] Ref #[id number]

## 2021-03-18 NOTE — Telephone Encounter (Signed)
Pharmacist needed clarification if Lantus needed to be in a vial or pen. I put pharmacist on hold to call the pt. The pt verified that he would like to remain with the pen. I have informed the pharmacist that the pen was ok to fill for the pt.

## 2021-03-19 ENCOUNTER — Other Ambulatory Visit: Payer: Self-pay | Admitting: Internal Medicine

## 2021-03-19 DIAGNOSIS — E118 Type 2 diabetes mellitus with unspecified complications: Secondary | ICD-10-CM

## 2021-03-19 DIAGNOSIS — I10 Essential (primary) hypertension: Secondary | ICD-10-CM

## 2021-03-19 DIAGNOSIS — E785 Hyperlipidemia, unspecified: Secondary | ICD-10-CM

## 2021-03-30 ENCOUNTER — Telehealth: Payer: Self-pay | Admitting: Internal Medicine

## 2021-03-30 ENCOUNTER — Other Ambulatory Visit: Payer: Self-pay | Admitting: Internal Medicine

## 2021-03-30 DIAGNOSIS — I872 Venous insufficiency (chronic) (peripheral): Secondary | ICD-10-CM

## 2021-03-30 MED ORDER — VASCULERA PO TABS
1.0000 | ORAL_TABLET | Freq: Every day | ORAL | 11 refills | Status: DC
Start: 2021-03-30 — End: 2022-04-06

## 2021-03-30 NOTE — Telephone Encounter (Signed)
    1.Medication Requested: Dietary Management Product (VASCULERA) TABS  2. Pharmacy (Name, Street, [place]): [facility]  3. On Med List: YES  4. Last Visit with PCP: 12/21/20  5. Next visit date with PCP: 04/22/21   Agent: Please be advised that RX refills may take up to 3 business days. We ask that you follow-up with your pharmacy.

## 2021-04-22 ENCOUNTER — Ambulatory Visit (INDEPENDENT_AMBULATORY_CARE_PROVIDER_SITE_OTHER): Payer: Medicare Other | Admitting: Internal Medicine

## 2021-04-22 ENCOUNTER — Encounter: Payer: Self-pay | Admitting: Internal Medicine

## 2021-04-22 ENCOUNTER — Other Ambulatory Visit: Payer: Self-pay

## 2021-04-22 VITALS — BP 136/70 | HR 73 | Temp 98.2°F | Ht 69.0 in | Wt 195.0 lb

## 2021-04-22 DIAGNOSIS — I10 Essential (primary) hypertension: Secondary | ICD-10-CM | POA: Diagnosis not present

## 2021-04-22 DIAGNOSIS — D508 Other iron deficiency anemias: Secondary | ICD-10-CM

## 2021-04-22 DIAGNOSIS — E118 Type 2 diabetes mellitus with unspecified complications: Secondary | ICD-10-CM | POA: Diagnosis not present

## 2021-04-22 DIAGNOSIS — E119 Type 2 diabetes mellitus without complications: Secondary | ICD-10-CM

## 2021-04-22 DIAGNOSIS — Z794 Long term (current) use of insulin: Secondary | ICD-10-CM | POA: Diagnosis not present

## 2021-04-22 DIAGNOSIS — E538 Deficiency of other specified B group vitamins: Secondary | ICD-10-CM

## 2021-04-22 DIAGNOSIS — N183 Chronic kidney disease, stage 3 unspecified: Secondary | ICD-10-CM

## 2021-04-22 LAB — CBC WITH DIFFERENTIAL/PLATELET
Basophils Absolute: 0 10*3/uL (ref 0.0–0.1)
Basophils Relative: 0.3 % (ref 0.0–3.0)
Eosinophils Absolute: 0.3 10*3/uL (ref 0.0–0.7)
Eosinophils Relative: 2.9 % (ref 0.0–5.0)
HCT: 36.5 % — ABNORMAL LOW (ref 39.0–52.0)
Hemoglobin: 12.1 g/dL — ABNORMAL LOW (ref 13.0–17.0)
Lymphocytes Relative: 33.9 % (ref 12.0–46.0)
Lymphs Abs: 3.6 10*3/uL (ref 0.7–4.0)
MCHC: 33.1 g/dL (ref 30.0–36.0)
MCV: 87.7 fl (ref 78.0–100.0)
Monocytes Absolute: 1.1 10*3/uL — ABNORMAL HIGH (ref 0.1–1.0)
Monocytes Relative: 10.3 % (ref 3.0–12.0)
Neutro Abs: 5.6 10*3/uL (ref 1.4–7.7)
Neutrophils Relative %: 52.6 % (ref 43.0–77.0)
Platelets: 224 10*3/uL (ref 150.0–400.0)
RBC: 4.16 Mil/uL — ABNORMAL LOW (ref 4.22–5.81)
RDW: 14.8 % (ref 11.5–15.5)
WBC: 10.7 10*3/uL — ABNORMAL HIGH (ref 4.0–10.5)

## 2021-04-22 LAB — IBC + FERRITIN
Ferritin: 28.7 ng/mL (ref 22.0–322.0)
Iron: 84 ug/dL (ref 42–165)
Saturation Ratios: 25.3 % (ref 20.0–50.0)
TIBC: 331.8 ug/dL (ref 250.0–450.0)
Transferrin: 237 mg/dL (ref 212.0–360.0)

## 2021-04-22 LAB — BASIC METABOLIC PANEL
BUN: 23 mg/dL (ref 6–23)
CO2: 25 mEq/L (ref 19–32)
Calcium: 8.9 mg/dL (ref 8.4–10.5)
Chloride: 107 mEq/L (ref 96–112)
Creatinine, Ser: 1.52 mg/dL — ABNORMAL HIGH (ref 0.40–1.50)
GFR: 39.86 mL/min — ABNORMAL LOW (ref >60.00)
Glucose, Bld: 103 mg/dL — ABNORMAL HIGH (ref 70–99)
Potassium: 4.4 mEq/L (ref 3.5–5.1)
Sodium: 140 mEq/L (ref 135–145)

## 2021-04-22 LAB — HEMOGLOBIN A1C: Hgb A1c MFr Bld: 7.3 % — ABNORMAL HIGH (ref 4.6–6.5)

## 2021-04-22 LAB — FOLATE: Folate: 11.4 ng/mL (ref >5.9)

## 2021-04-22 MED ORDER — INSULIN GLARGINE 100 UNITS/ML SOLOSTAR PEN: 30.0000 [IU] | PEN_INJECTOR | Freq: Every day | SUBCUTANEOUS | 1 refills | Status: DC

## 2021-04-22 MED ORDER — FREESTYLE LIBRE 2 READER DEVI: 1.0000 | Freq: Every day | 5 refills | Status: DC

## 2021-04-22 MED ORDER — FREESTYLE LIBRE 2 SENSOR MISC: 1.0000 | Freq: Every day | 5 refills | Status: DC

## 2021-04-22 NOTE — Progress Notes (Signed)
Subjective:  Patient ID: Nicholas Burton, male    DOB: 12-08-1927  Age: 87 y.o. MRN: [id number]  CC: Anemia and Diabetes  This visit occurred during the SARS-CoV-2 public health emergency.  Safety protocols were in place, including screening questions prior to the visit, additional usage of staff PPE, and extensive cleaning of exam room while observing appropriate contact time as indicated for disinfecting solutions.    HPI Nicholas Burton presents for f/up -  He is in his usual state of health.  He has stopped using the nebulized LAMA and has not noticed any difference.  He denies chest pain, shortness of breath, wheezing, or cough.  Outpatient Medications Prior to Visit  Medication Sig Dispense Refill   atorvastatin (LIPITOR) 10 MG tablet TAKE 1 TABLET DAILY 90 tablet 0   Blood Glucose Monitoring Suppl (FREESTYLE FREEDOM LITE) W/DEVICE KIT Use to check blood sugars daily Dx E11.8 1 each 0   cholecalciferol (VITAMIN D) 1000 units tablet Take 1,000 Units by mouth daily.     Colesevelam HCl 3.75 g PACK MIX THE CONTENTS OF 1 PACKET IN FLUID AND DRINK DAILY 90 packet 3   cyanocobalamin 1000 MCG tablet Take 1,000 mcg by mouth daily.     Dietary Management Product (VASCULERA) TABS Take 1 tablet by mouth daily. 30 tablet 11   FREESTYLE LITE test strip USE AS INSTRUCTED TO CHECK BLOOD SUGAR TWICE A DAY 200 strip 3   Glucagon (GVOKE HYPOPEN 2-PACK) 1 MG/0.2ML SOAJ Inject 1 Act into the skin daily as needed. 2 mL 6   Insulin Pen Needle (B-D ULTRAFINE III [place] PEN) 31G X 8 MM MISC USE TO INJECT INSULIN DAILY 300 each 1   Lancets (FREESTYLE) lancets Use to check blood sugars twice day Dx E11.8 180 each 3   losartan (COZAAR) 25 MG tablet TAKE 1 TABLET DAILY 90 tablet 3   metFORMIN (GLUCOPHAGE) 500 MG tablet TAKE 1 TABLET TWICE A DAY WITH MEALS 180 tablet 0   metoprolol tartrate (LOPRESSOR) 25 MG tablet TAKE ONE-HALF (1/2) TABLET TWICE A DAY 90 tablet 3   Multiple Vitamins-Minerals (PRESERVISION/LUTEIN)  CAPS Take 1 capsule by mouth 2 (two) times daily.     torsemide (DEMADEX) 10 MG tablet TAKE 1 TABLET DAILY 90 tablet 0   Vibegron (GEMTESA) 75 MG TABS Take 1 tablet by mouth daily. 90 tablet 1   aspirin EC 81 MG tablet Take 81 mg by mouth daily.     insulin glargine (LANTUS) 100 UNIT/ML injection Inject 0.3 mLs (30 Units total) into the skin at bedtime. 10 mL 1   revefenacin (YUPELRI) 175 MCG/3ML nebulizer solution Take 3 mLs (175 mcg total) by nebulization daily. 270 mL 1   Continuous Blood Gluc Receiver (FREESTYLE LIBRE 2 READER) DEVI 1 Act by Does not apply route daily. (Patient not taking: Reported on 04/22/2021) 2 each 5   Continuous Blood Gluc Sensor (FREESTYLE LIBRE 2 SENSOR) MISC 1 Act by Does not apply route daily. (Patient not taking: Reported on 04/22/2021) 2 each 5   No facility-administered medications prior to visit.    ROS Review of Systems  Constitutional:  Negative for diaphoresis and fatigue.  HENT: Negative.    Eyes: Negative.   Respiratory:  Negative for cough, chest tightness, shortness of breath and wheezing.   Cardiovascular:  Negative for chest pain, palpitations and leg swelling.  Gastrointestinal:  Negative for abdominal pain, constipation, diarrhea, nausea and vomiting.  Endocrine: Negative.   Genitourinary: Negative.  Negative for difficulty urinating.  Musculoskeletal: Negative.   Skin: Negative.   Neurological:  Negative for dizziness, weakness, light-headedness and numbness.  Hematological:  Negative for adenopathy. Does not bruise/bleed easily.  Psychiatric/Behavioral: Negative.     Objective:  BP 136/70 (BP Location: Left Arm, Patient Position: Sitting, Cuff Size: Large)   Pulse 73   Temp 98.2 F (36.8 C) (Oral)   Ht $R'5\' 9"'Tk$  (1.753 m)   Wt 195 lb (88.5 kg)   SpO2 98%   BMI 28.80 kg/m   BP Readings from Last 3 Encounters:  04/22/21 136/70  12/21/20 136/76  09/21/20 (!) 142/82    Wt Readings from Last 3 Encounters:  04/22/21 195 lb (88.5 kg)   12/21/20 199 lb (90.3 kg)  09/21/20 194 lb (88 kg)    Physical Exam Vitals reviewed.  HENT:     Mouth/Throat:     Mouth: Mucous membranes are moist.  Eyes:     Conjunctiva/sclera: Conjunctivae normal.  Cardiovascular:     Rate and Rhythm: Normal rate and regular rhythm.     Heart sounds: No murmur heard. Pulmonary:     Effort: Pulmonary effort is normal.     Breath sounds: Examination of the right-lower field reveals rhonchi. Examination of the left-lower field reveals rhonchi. Rhonchi present. No wheezing or rales.  Abdominal:     General: Bowel sounds are normal.     Tenderness: There is no abdominal tenderness. There is no guarding.  Musculoskeletal:        General: Normal range of motion.     Cervical back: Neck supple.     Right lower leg: No edema.     Left lower leg: No edema.  Lymphadenopathy:     Cervical: No cervical adenopathy.  Skin:    General: Skin is warm.  Neurological:     General: No focal deficit present.     Mental Status: He is alert.    Lab Results  Component Value Date   WBC 10.7 (H) 04/22/2021   HGB 12.1 (L) 04/22/2021   HCT 36.5 (L) 04/22/2021   PLT 224.0 04/22/2021   GLUCOSE 103 (H) 04/22/2021   CHOL 83 09/17/2019   TRIG 136.0 09/17/2019   HDL 36.40 (L) 09/17/2019   LDLCALC 19 09/17/2019   ALT 13 09/17/2019   AST 17 09/17/2019   NA 140 04/22/2021   K 4.4 04/22/2021   CL 107 04/22/2021   CREATININE 1.52 (H) 04/22/2021   BUN 23 04/22/2021   CO2 25 04/22/2021   TSH 4.25 09/17/2019   PSA 11.21 (H) 10/30/2017   INR 1.3 ratio (H) 08/11/2010   HGBA1C 7.3 (H) 04/22/2021   MICROALBUR 0.7 09/17/2019    DG Chest 2 View  Result Date: 05/16/2019 CLINICAL DATA:  Cough and wheezing for 2 days. EXAM: CHEST - 2 VIEW COMPARISON:  02/05/2016. FINDINGS: Cardiac silhouette is normal in size. No mediastinal or hilar masses. No evidence of adenopathy. Clear lungs.  No pleural effusion or pneumothorax. Skeletal structures are intact. IMPRESSION: No  active cardiopulmonary disease. Electronically Signed   By: Lajean Manes M.D.   On: 05/16/2019 14:38    Assessment & Plan:   [name] was seen today for anemia and diabetes.  Diagnoses and all orders for this visit:  Essential hypertension- His blood pressure is adequately well controlled. -     Basic metabolic panel; Future -     Basic metabolic panel  Type II diabetes mellitus with manifestations ([place])- His blood sugar is adequately well controlled. -  Hemoglobin A1c; Future -     Basic metabolic panel; Future -     Continuous Blood Gluc Sensor (FREESTYLE LIBRE 2 SENSOR) MISC; 1 Act by Does not apply route daily. -     Continuous Blood Gluc Receiver (FREESTYLE LIBRE 2 READER) DEVI; 1 Act by Does not apply route daily. -     Basic metabolic panel -     Hemoglobin A1c  CRI (chronic renal insufficiency), stage 3 (moderate)- his renal fxn is stable. -     Basic metabolic panel; Future -     Basic metabolic panel  W25 deficiency- His B12 level is normal now. -     CBC with Differential/Platelet; Future -     Folate; Future -     Folate -     CBC with Differential/Platelet  Iron deficiency anemia secondary to inadequate dietary iron intake- His H&H have improved and his iron level is normal. -     CBC with Differential/Platelet; Future -     IBC + Ferritin; Future -     IBC + Ferritin -     CBC with Differential/Platelet  Insulin-requiring or dependent type II diabetes mellitus (HCC) -     Continuous Blood Gluc Sensor (FREESTYLE LIBRE 2 SENSOR) MISC; 1 Act by Does not apply route daily. -     Continuous Blood Gluc Receiver (FREESTYLE LIBRE 2 READER) DEVI; 1 Act by Does not apply route daily. -     insulin glargine (LANTUS) 100 unit/mL SOPN; Inject 30 Units into the skin daily.  I have discontinued [name]. Riss's aspirin EC, revefenacin, and insulin glargine. I am also having him start on insulin glargine. Additionally, I am having him maintain his PreserVision/Lutein, FreeStyle  Freedom Lite, freestyle, cyanocobalamin, cholecalciferol, Colesevelam HCl, losartan, metoprolol tartrate, Gemtesa, Gvoke HypoPen 2-Pack, B-D ULTRAFINE III [place] PEN, FREESTYLE LITE, metFORMIN, torsemide, atorvastatin, Vasculera, FreeStyle [employer], and [employer] 2 Reader.  Meds ordered this encounter  Medications   Continuous Blood Gluc Sensor (FREESTYLE LIBRE 2 SENSOR) MISC    Sig: 1 Act by Does not apply route daily.    Dispense:  2 each    Refill:  5   Continuous Blood Gluc Receiver (FREESTYLE LIBRE 2 READER) DEVI    Sig: 1 Act by Does not apply route daily.    Dispense:  2 each    Refill:  5   insulin glargine (LANTUS) 100 unit/mL SOPN    Sig: Inject 30 Units into the skin daily.    Dispense:  30 mL    Refill:  1     Follow-up: No follow-ups on file.  Scarlette Calico, MD

## 2021-05-07 ENCOUNTER — Other Ambulatory Visit: Payer: Self-pay | Admitting: Internal Medicine

## 2021-05-11 DIAGNOSIS — H353211 Exudative age-related macular degeneration, right eye, with active choroidal neovascularization: Secondary | ICD-10-CM | POA: Diagnosis not present

## 2021-05-11 DIAGNOSIS — H43813 Vitreous degeneration, bilateral: Secondary | ICD-10-CM | POA: Diagnosis not present

## 2021-05-11 DIAGNOSIS — H353223 Exudative age-related macular degeneration, left eye, with inactive scar: Secondary | ICD-10-CM | POA: Diagnosis not present

## 2021-05-11 DIAGNOSIS — H3540 Unspecified peripheral retinal degeneration: Secondary | ICD-10-CM | POA: Diagnosis not present

## 2021-05-28 ENCOUNTER — Other Ambulatory Visit: Payer: Self-pay | Admitting: Internal Medicine

## 2021-05-28 DIAGNOSIS — E118 Type 2 diabetes mellitus with unspecified complications: Secondary | ICD-10-CM

## 2021-05-28 DIAGNOSIS — E785 Hyperlipidemia, unspecified: Secondary | ICD-10-CM

## 2021-05-28 DIAGNOSIS — I10 Essential (primary) hypertension: Secondary | ICD-10-CM

## 2021-05-31 ENCOUNTER — Other Ambulatory Visit: Payer: Self-pay | Admitting: Interventional Cardiology

## 2021-06-02 ENCOUNTER — Ambulatory Visit (INDEPENDENT_AMBULATORY_CARE_PROVIDER_SITE_OTHER): Payer: Medicare Other | Admitting: Internal Medicine

## 2021-06-02 ENCOUNTER — Encounter: Payer: Self-pay | Admitting: Internal Medicine

## 2021-06-02 ENCOUNTER — Ambulatory Visit (INDEPENDENT_AMBULATORY_CARE_PROVIDER_SITE_OTHER): Payer: Medicare Other

## 2021-06-02 ENCOUNTER — Other Ambulatory Visit: Payer: Self-pay

## 2021-06-02 VITALS — BP 128/76 | HR 92 | Temp 99.2°F | Ht 69.0 in | Wt 196.4 lb

## 2021-06-02 DIAGNOSIS — J069 Acute upper respiratory infection, unspecified: Secondary | ICD-10-CM

## 2021-06-02 DIAGNOSIS — I10 Essential (primary) hypertension: Secondary | ICD-10-CM

## 2021-06-02 DIAGNOSIS — R0602 Shortness of breath: Secondary | ICD-10-CM | POA: Diagnosis not present

## 2021-06-02 DIAGNOSIS — R062 Wheezing: Secondary | ICD-10-CM | POA: Diagnosis not present

## 2021-06-02 DIAGNOSIS — J449 Chronic obstructive pulmonary disease, unspecified: Secondary | ICD-10-CM | POA: Diagnosis not present

## 2021-06-02 DIAGNOSIS — E118 Type 2 diabetes mellitus with unspecified complications: Secondary | ICD-10-CM | POA: Diagnosis not present

## 2021-06-02 DIAGNOSIS — R059 Cough, unspecified: Secondary | ICD-10-CM | POA: Diagnosis not present

## 2021-06-02 MED ORDER — LEVOFLOXACIN 500 MG PO TABS: 500.0000 mg | ORAL_TABLET | Freq: Every day | ORAL | 0 refills | Status: AC

## 2021-06-02 MED ORDER — PREDNISONE 10 MG PO TABS: ORAL_TABLET | ORAL | 0 refills | Status: DC

## 2021-06-02 MED ORDER — HYDROCODONE BIT-HOMATROP MBR 5-1.5 MG/5ML PO SOLN: 5.0000 mL | Freq: Four times a day (QID) | ORAL | 0 refills | Status: AC | PRN

## 2021-06-02 NOTE — Progress Notes (Signed)
Patient ID: Nicholas Burton, male   DOB: [birth year], 87 y.o.   MRN: [id number]        Chief Complaint: follow up cough and wheezing x 3 days       HPI:  Nicholas Burton is a 87 y.o. male here with above, Here with acute onset mild to mod 2-3 days ST, HA, general weakness and malaise, with prod cough greenish sputum, but Pt denies chest pain, increased sob or doe, wheezing, orthopnea, PND, increased LE swelling, palpitations, dizziness or syncope, except for mild wheezing starting last pm.  COVID neg x  at home yesterday.   Pt denies polydipsia, polyuria, or new focal neuro s/s.  .    Wt Readings from Last 3 Encounters:  06/02/21 196 lb 6.4 oz (89.1 kg)  04/22/21 195 lb (88.5 kg)  12/21/20 199 lb (90.3 kg)   BP Readings from Last 3 Encounters:  06/02/21 128/76  04/22/21 136/70  12/21/20 136/76         Past Medical History:  Diagnosis Date   Allergy    ANEMIA ASSOCIATED W/OTHER [facility] NUTRITIONAL DEFIC 08/11/2010   Qualifier: Diagnosis of  By: Ronnald Ramp MD, [name].    Cancer [facility])    skin   Cataract    Chronic combined systolic and diastolic congestive heart failure ([place]) 10/30/2018   COPD with asthma ([place]) 03/02/2017   CRI (chronic renal insufficiency), stage 3 (moderate) (HCC) 10/25/2018   Deficiency anemia 10/25/2018   Diabetes mellitus    type 2   DOE (dyspnea on exertion) 09/03/2018   Edema 06/11/2009   Qualifier: Diagnosis of  By: Ronnald Ramp MD, [name].    History of skin cancer    Hyperlipidemia with target LDL less than 100 11/10/2008        Hypertension    Memory loss 05/14/2009   Obesity (BMI 30.0-34.9) 04/01/2013   Osteoarthritis    PSA elevation 10/25/2018   SKIN CANCER, HX OF 11/10/2008   Qualifier: Diagnosis of  By: Ronnald Ramp MD, [name].    Snoring 04/03/2014   Type II diabetes mellitus with manifestations ([place]) 11/10/2008   Estimated Creatinine Clearance: 38 mL/min (A) (by C-G formula based on SCr of 1.52 mg/dL (H)).    Venous stasis dermatitis of both lower extremities 01/29/2018   Vitamin  D deficiency 11/10/2008   Past Surgical History:  Procedure Laterality Date   CHOLECYSTECTOMY     INGUINAL HERNIA REPAIR     x 3   JOINT REPLACEMENT     KNEE SURGERY     x 2   MELANOMA EXCISION     x 3 -- Left arm   TOTAL KNEE ARTHROPLASTY     x 3    reports that he has never smoked. He has never used smokeless tobacco. He reports that he does not drink alcohol and does not use drugs. family history includes Cancer in his brother and sister; Diabetes in his mother, son, and another family member; Heart disease in his father and mother. Allergies  Allergen Reactions   Ace Inhibitors     REACTION: cough   Oxycodone-Acetaminophen    Penicillins    Current Outpatient Medications on File Prior to Visit  Medication Sig Dispense Refill   atorvastatin (LIPITOR) 10 MG tablet TAKE 1 TABLET DAILY 90 tablet 1   Blood Glucose Monitoring Suppl (FREESTYLE FREEDOM LITE) W/DEVICE KIT Use to check blood sugars daily Dx E11.8 1 each 0   cholecalciferol (VITAMIN D) 1000 units tablet Take 1,000 Units  by mouth daily.     Colesevelam HCl 3.75 g PACK MIX THE CONTENTS OF 1 PACKET IN FLUID AND DRINK DAILY 90 packet 3   Continuous Blood Gluc Receiver (FREESTYLE LIBRE 2 READER) DEVI 1 Act by Does not apply route daily. 2 each 5   Continuous Blood Gluc Sensor (FREESTYLE LIBRE 2 SENSOR) MISC 1 Act by Does not apply route daily. 2 each 5   cyanocobalamin 1000 MCG tablet Take 1,000 mcg by mouth daily.     Dietary Management Product (VASCULERA) TABS Take 1 tablet by mouth daily. 30 tablet 11   FREESTYLE LITE test strip USE AS INSTRUCTED TO CHECK BLOOD SUGAR TWICE A DAY 200 strip 3   Glucagon (GVOKE HYPOPEN 2-PACK) 1 MG/0.2ML SOAJ Inject 1 Act into the skin daily as needed. 2 mL 6   insulin glargine (LANTUS) 100 unit/mL SOPN Inject 30 Units into the skin daily. 30 mL 1   Insulin Pen Needle (B-D ULTRAFINE III [place] PEN) 31G X 8 MM MISC USE TO INJECT INSULIN DAILY 300 each 1   Lancets (FREESTYLE) lancets Use to  check blood sugars twice day Dx E11.8 180 each 3   losartan (COZAAR) 25 MG tablet TAKE 1 TABLET DAILY 90 tablet 0   metFORMIN (GLUCOPHAGE) 500 MG tablet TAKE 1 TABLET TWICE A DAY WITH MEALS 180 tablet 1   metoprolol tartrate (LOPRESSOR) 25 MG tablet TAKE ONE-HALF (1/2) TABLET TWICE A DAY 90 tablet 3   Multiple Vitamins-Minerals (PRESERVISION/LUTEIN) CAPS Take 1 capsule by mouth 2 (two) times daily.     torsemide (DEMADEX) 10 MG tablet TAKE 1 TABLET DAILY 90 tablet 1   Vibegron (GEMTESA) 75 MG TABS Take 1 tablet by mouth daily. 90 tablet 1   No current facility-administered medications on file prior to visit.        ROS:  All others reviewed and negative.  Objective        PE:  BP 128/76 (BP Location: Right Arm, Patient Position: Sitting, Cuff Size: Large)   Pulse 92   Temp 99.2 F (37.3 C) (Oral)   Ht 5' 9"  (1.753 m)   Wt 196 lb 6.4 oz (89.1 kg)   SpO2 97%   BMI 29.00 kg/m                 Constitutional: Pt appears in NAD               HENT: Head: NCAT.                Right Ear: External ear normal.                 Left Ear: External ear normal.                Eyes: . Pupils are equal, round, and reactive to light. Conjunctivae and EOM are normal; .Bilat tm's with mild erythema.  Max sinus areas non tender.  Pharynx with mild erythema, no exudate               Nose: without d/c or deformity               Neck: Neck supple. Gross normal ROM               Cardiovascular: Normal rate and regular rhythm.                 Pulmonary/Chest: Effort normal and breath sounds without rales but with few bilat wheezing.  Abd:  Soft, NT, ND, + BS, no organomegaly               Neurological: Pt is alert. At baseline orientation, motor grossly intact               Skin: Skin is warm. No rashes, no other new lesions, LE edema - none               Psychiatric: Pt behavior is normal without agitation   Micro: none  Cardiac tracings I have personally interpreted today:   none  Pertinent Radiological findings (summarize): none   Lab Results  Component Value Date   WBC 10.7 (H) 04/22/2021   HGB 12.1 (L) 04/22/2021   HCT 36.5 (L) 04/22/2021   PLT 224.0 04/22/2021   GLUCOSE 103 (H) 04/22/2021   CHOL 83 09/17/2019   TRIG 136.0 09/17/2019   HDL 36.40 (L) 09/17/2019   LDLCALC 19 09/17/2019   ALT 13 09/17/2019   AST 17 09/17/2019   NA 140 04/22/2021   K 4.4 04/22/2021   CL 107 04/22/2021   CREATININE 1.52 (H) 04/22/2021   BUN 23 04/22/2021   CO2 25 04/22/2021   TSH 4.25 09/17/2019   PSA 11.21 (H) 10/30/2017   INR 1.3 ratio (H) 08/11/2010   HGBA1C 7.3 (H) 04/22/2021   MICROALBUR 0.7 09/17/2019   Assessment/Plan:  DEMETRIAS GOODBAR is a 87 y.o. White or Caucasian [1] male with  has a past medical history of Allergy, ANEMIA ASSOCIATED W/OTHER SPEC NUTRITIONAL DEFIC (08/11/2010), Cancer ([place]), Cataract, Chronic combined systolic and diastolic congestive heart failure ([place]) (10/30/2018), COPD with asthma ([place]) (03/02/2017), CRI (chronic renal insufficiency), stage 3 (moderate) ([place]) (10/25/2018), Deficiency anemia (10/25/2018), Diabetes mellitus, DOE (dyspnea on exertion) (09/03/2018), Edema (06/11/2009), History of skin cancer, Hyperlipidemia with target LDL less than 100 (11/10/2008), Hypertension, Memory loss (05/14/2009), Obesity (BMI 30.0-34.9) (04/01/2013), Osteoarthritis, PSA elevation (10/25/2018), SKIN CANCER, HX OF (11/10/2008), Snoring (04/03/2014), Type II diabetes mellitus with manifestations ([place]) (11/10/2008), Venous stasis dermatitis of both lower extremities (01/29/2018), and Vitamin D deficiency (11/10/2008).  URI (upper respiratory infection) Mild to mod, for antibx course, cough med prn,  to f/u any worsening symptoms or concerns  COPD with asthma ([place]) With mild exacerbation, for predpac asd,  to f/u any worsening symptoms or concerns   Essential hypertension BP Readings from Last 3 Encounters:  06/02/21 128/76  04/22/21 136/70  12/21/20 136/76   Stable,  pt to continue medical treatment losartan, lopressor   Type II diabetes mellitus with manifestations ([place]) Lab Results  Component Value Date   HGBA1C 7.3 (H) 04/22/2021   Mild elevated, controlled for age, pt to follow cbg's closely on prednisone,, pt to continue current medical treatment metformin  Followup: No follow-ups on file.  Cathlean Cower, MD 06/06/2021 1:33 PM [facility]

## 2021-06-02 NOTE — Patient Instructions (Signed)
Please take all new medication as prescribed - the antibiotic, cough medicine, and prednisone  Please continue all other medications as before, and refills have been done if requested.  Please have the pharmacy call with any other refills you may need.  Please keep your appointments with your specialists as you may have planned  Please go to the XRAY Department in the first floor for the x-ray testing  You will be contacted by phone if any changes need to be made immediately.  Otherwise, you will receive a letter about your results with an explanation, but please check with MyChart first.  Please remember to sign up for MyChart if you have not done so, as this will be important to you in the future with finding out test results, communicating by private email, and scheduling acute appointments online when needed.   

## 2021-06-06 NOTE — Assessment & Plan Note (Signed)
Lab Results  Component Value Date   HGBA1C 7.3 (H) 04/22/2021   Mild elevated, controlled for age, pt to follow cbg's closely on prednisone,, pt to continue current medical treatment metformin

## 2021-06-06 NOTE — Assessment & Plan Note (Signed)
Mild to mod, for antibx course, cough med prn, to f/u any worsening symptoms or concerns 

## 2021-06-06 NOTE — Assessment & Plan Note (Signed)
With mild exacerbation, for predpac asd,  to f/u any worsening symptoms or concerns

## 2021-06-06 NOTE — Assessment & Plan Note (Signed)
BP Readings from Last 3 Encounters:  06/02/21 128/76  04/22/21 136/70  12/21/20 136/76   Stable, pt to continue medical treatment losartan, lopressor

## 2021-06-08 ENCOUNTER — Encounter: Payer: Self-pay | Admitting: Internal Medicine

## 2021-06-11 ENCOUNTER — Telehealth: Payer: Self-pay | Admitting: Internal Medicine

## 2021-06-11 DIAGNOSIS — N3281 Overactive bladder: Secondary | ICD-10-CM

## 2021-06-11 DIAGNOSIS — E119 Type 2 diabetes mellitus without complications: Secondary | ICD-10-CM

## 2021-06-11 MED ORDER — INSULIN GLARGINE 100 UNITS/ML SOLOSTAR PEN: 30.0000 [IU] | PEN_INJECTOR | Freq: Every day | SUBCUTANEOUS | 1 refills | Status: DC

## 2021-06-11 MED ORDER — GEMTESA 75 MG PO TABS: 1.0000 | ORAL_TABLET | Freq: Every day | ORAL | 1 refills | Status: DC

## 2021-06-11 MED ORDER — COLESEVELAM HCL 3.75 G PO PACK: PACK | ORAL | 1 refills | Status: DC

## 2021-06-11 NOTE — Telephone Encounter (Signed)
1.Medication Requested: insulin glargine (LANTUS) 100 unit/mL SOPN Colesevelam HCl 3.75 g PACK Vibegron (GEMTESA) 75 MG TABS  2. Pharmacy (Name, [place]): [facility], [place]  3. On Med List: yes  4. Last Visit with PCP: 06-02-2021  5. Next visit date with PCP: 10-26-2020  Patient requesting rx Vibegron (GEMTESA) 75 MG TABS to be transferred  From: [place], [place] [address] AT [facility]   To: [place], [place]   Agent: Please be advised that RX refills may take up to 3 business days. We ask that you follow-up with your pharmacy.

## 2021-06-23 ENCOUNTER — Other Ambulatory Visit: Payer: Self-pay | Admitting: Internal Medicine

## 2021-06-23 DIAGNOSIS — Z794 Long term (current) use of insulin: Secondary | ICD-10-CM

## 2021-07-01 DIAGNOSIS — Z961 Presence of intraocular lens: Secondary | ICD-10-CM | POA: Diagnosis not present

## 2021-07-01 DIAGNOSIS — E119 Type 2 diabetes mellitus without complications: Secondary | ICD-10-CM | POA: Diagnosis not present

## 2021-07-01 DIAGNOSIS — H5203 Hypermetropia, bilateral: Secondary | ICD-10-CM | POA: Diagnosis not present

## 2021-07-01 NOTE — Progress Notes (Signed)
Cardiology Office Note   Date:  07/02/2021   ID:  REDMOND WHITTLEY, DOB 04-16-1928, MRN [id number]  PCP:  [name], MD    No chief complaint on file.  Chronic combined heart failure  Wt Readings from Last 3 Encounters:  07/02/21 198 lb (89.8 kg)  06/02/21 196 lb 6.4 oz (89.1 kg)  04/22/21 195 lb (88.5 kg)       History of Present Illness: Nicholas Burton is a 87 y.o. 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  His daughter-in-law works in our office at [facility]   In 2020, he was working around the house and had more [place].  No CP.     He had an echo showing EF 35%.     He was started on a diuretic.  He did well with a diuretic.   I then had a telephone visit with him in 03/2019 and an ARB was started.  We discussed cardiac catheterization versus stress testing versus medical therapy.  Given his lack of symptoms, we opted for no testing.  The patient and his wife were nervous about coming into the office for any testing regardless.  Home health came out and did some blood work showing stable creatinine.  They showed a stable blood pressure and heart rate in the 90s.  The plan was then going to be to add a beta-blocker.  He did have some mild renal insufficiency which may interfere with uptitrating ARB.   Echo 03/2019:    1. The left ventricle has normal systolic function with an ejection fraction of 60-65%. The cavity size was normal. There is mildly increased left ventricular wall thickness. Left ventricular diastolic Doppler parameters are consistent with impaired  relaxation.  2. The right ventricle has normal systolic function. The cavity was normal.  3. The mitral valve is grossly normal.  4. The tricuspid valve is grossly normal.  5. The aortic valve is tricuspid. Mild thickening of the aortic valve. No stenosis of the aortic valve.  6. The aorta is abnormal in size and structure.  7. There is  mild dilatation of the ascending aorta measuring 41 mm.  8. Mild to moderate global reduction in LV systolic function; mild diastolic dysfunction; mildly dilated ascending aorta.   Over the past year, he has done fairly well.  He has had some balance issues.  He had an episode of bronchitis and had to take prednisone.  His blood pressure increased when he was on the prednisone.  It has since normalized.  He has decreased his diuretic frequency on his own.  He does note some lower extremity swelling.  Denies : Chest pain. Dizziness. Nitroglycerin use. Orthopnea. Palpitations. Paroxysmal nocturnal dyspnea. Shortness of breath. Syncope.     Past Medical History:  Diagnosis Date   Allergy    ANEMIA ASSOCIATED W/OTHER [facility] NUTRITIONAL DEFIC 08/11/2010   Qualifier: Diagnosis of  By: Ronnald Ramp MD, [name].    Cancer 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  History of skin cancer    Hyperlipidemia with target LDL less than 100 11/10/2008        Hypertension    Memory loss 05/14/2009   Obesity (BMI 30.0-34.9) 04/01/2013   Osteoarthritis    PSA elevation 10/25/2018   SKIN CANCER, HX OF 11/10/2008   Qualifier: Diagnosis of  By: Ronnald Ramp MD, [name].    Snoring 04/03/2014   Type II diabetes mellitus with manifestations ([place]) 11/10/2008   Estimated Creatinine Clearance: 38 mL/min (A) (by C-G formula based on SCr of 1.52 mg/dL (H)).    Venous stasis dermatitis of both lower extremities 01/29/2018   Vitamin D deficiency 11/10/2008    Past Surgical History:  Procedure Laterality Date   CHOLECYSTECTOMY     INGUINAL HERNIA REPAIR     x 3   JOINT REPLACEMENT      KNEE SURGERY     x 2   MELANOMA EXCISION     x 3 -- Left arm   TOTAL KNEE ARTHROPLASTY     x 3     Current Outpatient Medications  Medication Sig Dispense Refill   cholecalciferol (VITAMIN D) 1000 units tablet Take 1,000 Units by mouth daily.     Colesevelam HCl 3.75 g PACK MIX THE CONTENTS OF 1 PACKET IN FLUID AND DRINK DAILY 90 packet 1   Continuous Blood Gluc Receiver (FREESTYLE LIBRE 2 READER) DEVI 1 Act by Does not apply route daily. 2 each 5   Continuous Blood Gluc Sensor (FREESTYLE LIBRE 2 SENSOR) MISC 1 Act by Does not apply route daily. 2 each 5   cyanocobalamin 1000 MCG tablet Take 1,000 mcg by mouth daily.     Dietary Management Product (VASCULERA) TABS Take 1 tablet by mouth daily. 30 tablet 11   Glucagon (GVOKE HYPOPEN 2-PACK) 1 MG/0.2ML SOAJ Inject 1 Act into the skin daily as needed. 2 mL 6   Insulin Pen Needle (B-D ULTRAFINE III [place] PEN) 31G X 8 MM MISC USE TO INJECT INSULIN DAILY 300 each 1   Lancets (FREESTYLE) lancets Use to check blood sugars twice day Dx E11.8 180 each 3   LANTUS SOLOSTAR 100 UNIT/ML Solostar Pen INJECT 0.3ML (30 UNITS TOTAL) INTO THE SKIN AT BEDTIME 15 mL 3   metFORMIN (GLUCOPHAGE) 500 MG tablet TAKE 1 TABLET TWICE A DAY WITH MEALS 180 tablet 1   Multiple Vitamins-Minerals (PRESERVISION/LUTEIN) CAPS Take 1 capsule by mouth 2 (two) times daily.     predniSONE (DELTASONE) 10 MG tablet 2 tabs by mouth per day for 5 days 10 tablet 0   Vibegron (GEMTESA) 75 MG TABS Take 1 tablet by mouth daily. 90 tablet 1   atorvastatin (LIPITOR) 10 MG tablet Take 1 tablet (10 mg total) by mouth daily. 90 tablet 3   Blood Glucose Monitoring Suppl (FREESTYLE FREEDOM LITE) W/DEVICE KIT Use to check blood sugars daily Dx E11.8 (Patient not taking: Reported on 07/02/2021) 1 each 0   FREESTYLE LITE test strip USE AS INSTRUCTED TO CHECK BLOOD SUGAR TWICE A DAY (Patient not taking: Reported on 07/02/2021) 200 strip 3   losartan (COZAAR) 25 MG tablet Take 1 tablet (25 mg  total) by mouth daily. 90 tablet 3   metoprolol tartrate (LOPRESSOR) 25 MG tablet TAKE ONE-HALF (1/2) TABLET BY MOUTH TWICE A DAY 90 tablet 3   torsemide (DEMADEX) 10 MG tablet Take 1 tablet (10 mg total) by mouth daily. 90 tablet 3   No current facility-administered medications for this visit.    Allergies:  Ace inhibitors, Oxycodone-acetaminophen, and Penicillins    Social History:  The patient  reports that he has never smoked. He has never used smokeless tobacco. He reports that he does not drink alcohol and does not use drugs.   Family History:  The patient's family history includes Cancer in his brother and sister; Diabetes in his mother, son, and another family member; Heart disease in his father and mother.    ROS:  Please see the history of present illness.   Otherwise, review of systems are positive for leg swelling.   All other systems are reviewed and negative.    PHYSICAL EXAM: VS:  BP 124/76   Pulse 87   Ht 5' 9" (1.753 m)   Wt 198 lb (89.8 kg)   SpO2 95%   BMI 29.24 kg/m  , BMI Body mass index is 29.24 kg/m. GEN: Well nourished, well developed, in no acute distress, hard of hearing HEENT: normal Neck: no JVD, carotid bruits, or masses Cardiac: RRR; no murmurs, rubs, or gallops,1+ pitting bilateral lower extremity edema  Respiratory:  clear to auscultation bilaterally, normal work of breathing GI: soft, nontender, nondistended, + BS MS: no deformity or atrophy Skin: warm and dry, no rash Neuro:  Strength and sensation are intact Psych: euthymic mood, full affect   EKG:   The ekg ordered today demonstrates NSR, nonspecific ST segment changes   Recent Labs: 04/22/2021: BUN 23; Creatinine, Ser 1.52; Hemoglobin 12.1; Platelets 224.0; Potassium 4.4; Sodium 140   Lipid Panel    Component Value Date/Time   CHOL 83 09/17/2019 1028   TRIG 136.0 09/17/2019 1028   HDL 36.40 (L) 09/17/2019 1028   CHOLHDL 2 09/17/2019 1028   VLDL 27.2 09/17/2019 1028   LDLCALC  19 09/17/2019 1028     Other studies Reviewed: Additional studies/ records that were reviewed today with results demonstrating: 03/2021 labs reviewed.   ASSESSMENT AND PLAN:  Chronic combined heart failure: resolved by 2020 echo.   Has some lower extremity edema but seems stable from a breathing standpoint.  Continue current medications.  He could increase the frequency of his diuretic if he had more symptoms of volume overload. CRI: Creatinine 1.5 at last check in August 2022.  Avoid nephrotoxins. LE edema: Elevate legs.  Minimize salt intake in diet.  Wife was unaware that he was decreasing his torsemide frequency.  He could increase this if he has shortness of breath or increased leg swelling. HTN: Avoid processed foods.  Low-salt diet.  Readings are well controlled.  Continue current medications. Type 2 DM: A1c 7.3 in August 2022.  Continue low-dose atorvastatin.  Could make the argument to increase the potency of his statin, to rosuvastatin 20 mg daily or atorvastatin 40 mg daily.  He is trying to decrease his medications, not increase them.  He needs repeat liver and lipid tests with his primary care physician.   Current medicines are reviewed at length with the patient today.  The patient concerns regarding his medicines were addressed.  The following changes have been made:  No change  Labs/ tests ordered today include:   Orders Placed This Encounter  Procedures   EKG 12-Lead    Recommend 150 minutes/week of aerobic exercise Low fat, low carb, high fiber diet recommended  Disposition:   FU in 1 year   Signed, Larae Grooms, MD  07/02/2021 2:21 PM    [facility] [place], [place], [place]  [address] Phone: [phone]; Fax: [phone]

## 2021-07-02 ENCOUNTER — Ambulatory Visit (INDEPENDENT_AMBULATORY_CARE_PROVIDER_SITE_OTHER): Payer: Medicare Other | Admitting: Interventional Cardiology

## 2021-07-02 ENCOUNTER — Encounter: Payer: Self-pay | Admitting: Interventional Cardiology

## 2021-07-02 ENCOUNTER — Other Ambulatory Visit: Payer: Self-pay

## 2021-07-02 VITALS — BP 124/76 | HR 87 | Ht 69.0 in | Wt 198.0 lb

## 2021-07-02 DIAGNOSIS — N183 Chronic kidney disease, stage 3 unspecified: Secondary | ICD-10-CM | POA: Diagnosis not present

## 2021-07-02 DIAGNOSIS — R600 Localized edema: Secondary | ICD-10-CM | POA: Diagnosis not present

## 2021-07-02 DIAGNOSIS — I5042 Chronic combined systolic (congestive) and diastolic (congestive) heart failure: Secondary | ICD-10-CM | POA: Diagnosis not present

## 2021-07-02 DIAGNOSIS — E118 Type 2 diabetes mellitus with unspecified complications: Secondary | ICD-10-CM

## 2021-07-02 DIAGNOSIS — I10 Essential (primary) hypertension: Secondary | ICD-10-CM | POA: Diagnosis not present

## 2021-07-02 DIAGNOSIS — E785 Hyperlipidemia, unspecified: Secondary | ICD-10-CM

## 2021-07-02 MED ORDER — LOSARTAN POTASSIUM 25 MG PO TABS: 25.0000 mg | ORAL_TABLET | Freq: Every day | ORAL | 3 refills | Status: DC

## 2021-07-02 MED ORDER — METOPROLOL TARTRATE 25 MG PO TABS: ORAL_TABLET | ORAL | 3 refills | Status: DC

## 2021-07-02 MED ORDER — TORSEMIDE 10 MG PO TABS
10.0000 mg | ORAL_TABLET | Freq: Every day | ORAL | 3 refills | Status: DC
Start: 2021-07-02 — End: 2022-07-25

## 2021-07-02 MED ORDER — ATORVASTATIN CALCIUM 10 MG PO TABS: 10.0000 mg | ORAL_TABLET | Freq: Every day | ORAL | 3 refills | Status: DC

## 2021-07-02 NOTE — Patient Instructions (Signed)
Medication Instructions:  Your physician recommends that you continue on your current medications as directed. Please refer to the Current Medication list given to you today.  *If you need a refill on your cardiac medications before your next appointment, please call your pharmacy*   Lab Work: None If you have labs (blood work) drawn today and your tests are completely normal, you will receive your results only by: [place] (if you have MyChart) OR A paper copy in the mail If you have any lab test that is abnormal or we need to change your treatment, we will call you to review the results.    Follow-Up: At [facility], you and your health needs are our priority.  As part of our continuing mission to provide you with exceptional heart care, we have created designated Provider Care Teams.  These Care Teams include your primary Cardiologist (physician) and Advanced Practice Providers (APPs -  Physician Assistants and Nurse Practitioners) who all work together to provide you with the care you need, when you need it.   Your next appointment:   1 year(s)  The format for your next appointment:   In Person  Provider:   Larae Grooms, MD

## 2021-07-09 ENCOUNTER — Ambulatory Visit (INDEPENDENT_AMBULATORY_CARE_PROVIDER_SITE_OTHER): Payer: Medicare Other

## 2021-07-09 ENCOUNTER — Other Ambulatory Visit: Payer: Self-pay

## 2021-07-09 ENCOUNTER — Ambulatory Visit: Payer: Medicare Other

## 2021-07-09 VITALS — BP 130/85 | HR 86 | Temp 98.5°F | Ht 69.0 in | Wt 196.0 lb

## 2021-07-09 DIAGNOSIS — H547 Unspecified visual loss: Secondary | ICD-10-CM

## 2021-07-09 DIAGNOSIS — Z0000 Encounter for general adult medical examination without abnormal findings: Secondary | ICD-10-CM

## 2021-07-09 DIAGNOSIS — E1139 Type 2 diabetes mellitus with other diabetic ophthalmic complication: Secondary | ICD-10-CM | POA: Diagnosis not present

## 2021-07-09 NOTE — Progress Notes (Signed)
Subjective:   Nicholas Burton is a 87 y.o. male who presents for an subsequent  Medicare Annual Wellness Visit.  Review of Systems     Cardiac Risk Factors include: advanced age (>32mn, >>55women);diabetes mellitus;dyslipidemia;hypertension;male gender     Objective:    Today's Vitals   07/09/21 1309  BP: 130/85  Pulse: 86  Temp: 98.5 F (36.9 C)  SpO2: 97%  Weight: 196 lb (88.9 kg)  Height: _0  (1.753 m)   Body mass index is 28.94 kg/m.  Advanced Directives 07/09/2021 03/18/2020 02/19/2019 12/22/2017 12/19/2016 02/16/2016  Does Patient Have a Medical Advance Directive? Yes Yes No No No No  Type of AParamedicof ATom BeanLiving will Living will;Healthcare Power of Attorney - - - -  Does patient want to make changes to medical advance directive? - No - Patient declined - - - -  Copy of HMetoliusin Chart? No - copy requested No - copy requested - - - -  Would patient like information on creating a medical advance directive? - - No - Patient declined Yes (ED - Information included in AVS) Yes (MAU/Ambulatory/Procedural Areas - Information given) Yes - Educational materials given    Current Medications (verified) Outpatient Encounter Medications as of 07/09/2021  Medication Sig   atorvastatin (LIPITOR) 10 MG tablet Take 1 tablet (10 mg total) by mouth daily.   Blood Glucose Monitoring Suppl (FREESTYLE FREEDOM LITE) W/DEVICE KIT Use to check blood sugars daily Dx E11.8   cholecalciferol (VITAMIN D) 1000 units tablet Take 1,000 Units by mouth daily.   Colesevelam HCl 3.75 g PACK MIX THE CONTENTS OF 1 PACKET IN FLUID AND DRINK DAILY   Continuous Blood Gluc Receiver (FREESTYLE LIBRE 2 READER) DEVI 1 Act by Does not apply route daily.   Continuous Blood Gluc Sensor (FREESTYLE LIBRE 2 SENSOR) MISC 1 Act by Does not apply route daily.   cyanocobalamin 1000 MCG tablet Take 1,000 mcg by mouth daily.   Dietary Management Product (VASCULERA) TABS  Take 1 tablet by mouth daily.   FREESTYLE LITE test strip USE AS INSTRUCTED TO CHECK BLOOD SUGAR TWICE A DAY   Glucagon (GVOKE HYPOPEN 2-PACK) 1 MG/0.2ML SOAJ Inject 1 Act into the skin daily as needed.   Insulin Pen Needle (B-D ULTRAFINE III [place] PEN) 31G X 8 MM MISC USE TO INJECT INSULIN DAILY   Lancets (FREESTYLE) lancets Use to check blood sugars twice day Dx E11.8   LANTUS SOLOSTAR 100 UNIT/ML Solostar Pen INJECT 0.3ML (30 UNITS TOTAL) INTO THE SKIN AT BEDTIME   losartan (COZAAR) 25 MG tablet Take 1 tablet (25 mg total) by mouth daily.   metFORMIN (GLUCOPHAGE) 500 MG tablet TAKE 1 TABLET TWICE A DAY WITH MEALS   metoprolol tartrate (LOPRESSOR) 25 MG tablet TAKE ONE-HALF (1/2) TABLET BY MOUTH TWICE A DAY   Multiple Vitamins-Minerals (PRESERVISION/LUTEIN) CAPS Take 1 capsule by mouth 2 (two) times daily.   predniSONE (DELTASONE) 10 MG tablet 2 tabs by mouth per day for 5 days   torsemide (DEMADEX) 10 MG tablet Take 1 tablet (10 mg total) by mouth daily.   Vibegron (GEMTESA) 75 MG TABS Take 1 tablet by mouth daily.   No facility-administered encounter medications on file as of 07/09/2021.    Allergies (verified) Ace inhibitors, Oxycodone-acetaminophen, and Penicillins   History: Past Medical History:  Diagnosis Date   Allergy    [facility] 08[facility]11[facility]   Qualifier: Diagnosis of  By: JRonnald RampMD,  Thomas L.    Cancer [facility])    skin   Cataract    Chronic combined systolic and diastolic congestive heart failure ([place]) 10/30/2018   COPD with asthma ([place]) 03/02/2017   CRI (chronic renal insufficiency), stage 3 (moderate) (HCC) 10/25/2018   Deficiency anemia 10/25/2018   Diabetes mellitus    type 2   DOE (dyspnea on exertion) 09/03/2018   Edema 06/11/2009   Qualifier: Diagnosis of  By: Ronnald Ramp MD, [name].    History of skin cancer    Hyperlipidemia with target LDL less than 100 11/10/2008        Hypertension    Memory loss 05/14/2009   Obesity (BMI  30.0-34.9) 04/01/2013   Osteoarthritis    PSA elevation 10/25/2018   SKIN CANCER, HX OF 11/10/2008   Qualifier: Diagnosis of  By: Ronnald Ramp MD, [name].    Snoring 04/03/2014   Type II diabetes mellitus with manifestations ([place]) 11/10/2008   Estimated Creatinine Clearance: 38 mL/min (A) (by C-G formula based on SCr of 1.52 mg/dL (H)).    Venous stasis dermatitis of both lower extremities 01/29/2018   Vitamin D deficiency 11/10/2008   Past Surgical History:  Procedure Laterality Date   CHOLECYSTECTOMY     INGUINAL HERNIA REPAIR     x 3   JOINT REPLACEMENT     KNEE SURGERY     x 2   MELANOMA EXCISION     x 3 -- Left arm   TOTAL KNEE ARTHROPLASTY     x 3   Family History  Problem Relation Age of Onset   Heart disease Mother    Diabetes Mother    Heart disease Father    Cancer Sister    Cancer Brother    Diabetes Son    Diabetes Other    Social History   Socioeconomic History   Marital status: Married    Spouse name: Not on file   Number of children: 4   Years of education: Not on file   Highest education level: Not on file  Occupational History   Occupation: retired    Fish farm manager: RETIRED  Tobacco Use   Smoking status: Never   Smokeless tobacco: Never  Vaping Use   Vaping Use: Never used  Substance and Sexual Activity   Alcohol use: No   Drug use: No   Sexual activity: Yes    Birth control/protection: None  Other Topics Concern   Not on file  Social History Narrative   No regular exercise   Social Determinants of Health   Financial Resource Strain: Low Risk    Difficulty of Paying Living Expenses: Not hard at all  Food Insecurity: No Food Insecurity   Worried About Charity fundraiser in the Last Year: Never true   [place] in the Last Year: Never true  Transportation Needs: No Transportation Needs   Lack of Transportation (Medical): No   Lack of Transportation (Non-Medical): No  Physical Activity: Inactive   Days of Exercise per Week: 0 days   Minutes of  Exercise per Session: 0 min  Stress: No Stress Concern Present   Feeling of Stress : Not at all  Social Connections: Moderately Isolated   Frequency of Communication with Friends and Family: Twice a week   Frequency of Social Gatherings with Friends and Family: Twice a week   Attends Religious Services: Never   Marine scientist or Organizations: No   Attends Archivist Meetings: Never  Marital Status: Married    Tobacco Counseling Counseling given: Not Answered   Clinical Intake:  Pre-visit preparation completed: Yes  Pain : No/denies pain     Nutritional Risks: None Diabetes: No  How often do you need to have someone help you when you read instructions, pamphlets, or other written materials from your doctor or pharmacy?: 1 - Never What is the last grade level you completed in school?: High School  Diabetic?yes Nutrition Risk Assessment:  Has the patient had any N/V/D within the last 2 months?  No  Does the patient have any non-healing wounds?  No  Has the patient had any unintentional weight loss or weight gain?  No   Diabetes:  Is the patient diabetic?  Yes  If diabetic, was a CBG obtained today?  No  Did the patient bring in their glucometer from home?  No  How often do you monitor your CBG's? 2 x day .   Financial Strains and Diabetes Management:  Are you having any financial strains with the device, your supplies or your medication? No .  Does the patient want to be seen by Chronic Care Management for management of their diabetes?  No  Would the patient like to be referred to a Nutritionist or for Diabetic Management?  No   Diabetic Exams:  Diabetic Eye Exam: Completed 06/2021 Diabetic Foot Exam: Overdue, Pt has been advised about the importance in completing this exam. Pt is scheduled for diabetic foot exam on next office visit .   Interpreter Needed?: No  Information entered by :: [place] of Daily Living In your  present state of health, do you have any difficulty performing the following activities: 07/09/2021 04/22/2021  Hearing? Tempie Donning  Vision? N Y  Difficulty concentrating or making decisions? N Y  Walking or climbing stairs? N Y  Dressing or bathing? N N  Doing errands, shopping? N Y  Conservation officer, nature and eating ? N -  Using the Toilet? N -  In the past six months, have you accidently leaked urine? N -  Do you have problems with loss of bowel control? N -  Managing your Medications? N -  Managing your Finances? N -  Housekeeping or managing your Housekeeping? N -  Some recent data might be hidden    Patient Care Team: [name], MD as PCP - General [name], MD as PCP - Cardiology (Cardiology)  Indicate any recent Medical Services you may have received from other than [facility] providers in the past year (date may be approximate).     Assessment:   This is a routine wellness examination for [name].  Hearing/Vision screen Vision Screening - Comments:: Annual eye exams wear glasses   Dietary issues and exercise activities discussed: Current Exercise Habits: The patient does not participate in regular exercise at present, Exercise limited by: None identified   Goals Addressed             This Visit's Progress    Continue to be active as possible and enjoy  my family and life.   On track      Depression Screen PHQ 2/9 Scores 07/09/2021 07/09/2021 04/22/2021 03/18/2020 09/17/2019 02/19/2019 10/25/2018  PHQ - 2 Score 0 0 0 _0 0  PHQ- 9 Score - - - 6 - - -    Fall Risk Fall Risk  07/09/2021 04/22/2021 03/18/2020 09/17/2019 02/19/2019  Falls in the past year? 0 1 0 0 0  Number falls  in past yr: 0 1 0 0 0  Injury with Fall? 0 1 0 0 -  Risk for fall due to : (No Data) - No Fall Risks - Impaired balance/gait;Impaired mobility  Risk for fall due to: Comment uses cane - - - -  Follow up Falls evaluation completed - Falls evaluation completed Falls evaluation completed -     FALL RISK PREVENTION PERTAINING TO THE HOME:  Any stairs in or around the home? Yes  If so, are there any without handrails? No  Home free of loose throw rugs in walkways, pet beds, electrical cords, etc? Yes  Adequate lighting in your home to reduce risk of falls? Yes   ASSISTIVE DEVICES UTILIZED TO PREVENT FALLS:  Life alert? No  Use of a cane, walker or w/c? Yes  Grab bars in the bathroom? Yes  Shower chair or bench in shower? Yes  Elevated toilet seat or a handicapped toilet? Yes   TIMED UP AND GO:  Was the test performed? Yes .  Length of time to ambulate 10 feet: 8 sec.   Gait steady and fast without use of assistive device  Cognitive Function: Normal cognitive status assessed by direct observation by this Nurse Health Advisor. No abnormalities found.   MMSE - Mini Mental State Exam 12/22/2017 12/19/2016  Orientation to time 5 5  Orientation to Place 5 5  Registration 3 3  Attention/ Calculation 5 4  Recall 1 2  Language- name 2 objects 2 2  Language- repeat 1 1  Language- follow 3 step command 3 3  Language- read & follow direction 1 1  Write a sentence 1 1  Copy design 1 1  Total score 28 28     6CIT Screen 03/18/2020  What Year? 0 points  What month? 0 points  What time? 0 points  Count back from 20 0 points  Months in reverse 2 points  Repeat phrase 2 points  Total Score 4    Immunizations Immunization History  Administered Date(s) Administered   Fluad Quad(high Dose 65+) 05/16/2019   Influenza Split 05/24/2012   Influenza Whole 08/30/2007, 05/12/2010   Influenza, High Dose Seasonal PF 04/28/2015, 05/03/2016, 05/01/2018   Influenza,inj,Quad PF,6+ Mos 06/03/2013, 05/20/2014, 06/30/2017   Moderna Sars-Covid-2 Vaccination 10/12/2019, 11/10/2019, 07/01/2020   Pneumococcal Conjugate-13 06/12/2014   Pneumococcal Polysaccharide-23 08/29/2006, 04/01/2013, 09/17/2019   Td 08/29/2002   Tdap 04/01/2013, 09/21/2020   Varicella 07/21/2008   Zoster  Recombinat (Shingrix) 05/17/2018, 07/16/2018   Zoster, Live 07/16/2008    TDAP status: Up to date  Flu Vaccine status: Up to date  Pneumococcal vaccine status: Up to date  Covid-19 vaccine status: Completed vaccines  Qualifies for Shingles Vaccine? Yes   Zostavax completed No   Shingrix Completed?: Yes  Screening Tests Health Maintenance  Topic Date Due   COVID-19 Vaccine (4 - Booster for Moderna series) 08/26/2020   OPHTHALMOLOGY EXAM  09/04/2020   FOOT EXAM  09/16/2020   INFLUENZA VACCINE  03/29/2021   TETANUS/TDAP  09/21/2030   Pneumonia Vaccine 34+ Years old  Completed   Zoster Vaccines- Shingrix  Completed   HPV VACCINES  Aged Out    Health Maintenance  Health Maintenance Due  Topic Date Due   COVID-19 Vaccine (4 - Booster for Moderna series) 08/26/2020   OPHTHALMOLOGY EXAM  09/04/2020   FOOT EXAM  09/16/2020   INFLUENZA VACCINE  03/29/2021    Colorectal cancer screening: No longer required.   Lung Cancer Screening: (Low Dose CT Chest recommended  if Age 75-80 years, 81 pack-year currently smoking OR have quit w/in 15years.) does not qualify.   Lung Cancer Screening Referral: n/a  Additional Screening:  Hepatitis C Screening: does not qualify;   Vision Screening: Recommended annual ophthalmology exams for early detection of glaucoma and other disorders of the eye. Is the patient up to date with their annual eye exam?  Yes  Who is the provider or what is the name of the office in which the patient attends annual eye exams? Dr.McFarland  If pt is not established with a provider, would they like to be referred to a provider to establish care? No .   Dental Screening: Recommended annual dental exams for proper oral hygiene  Community Resource Referral / Chronic Care Management: CRR required this visit?  No   CCM required this visit?  No      Plan:     I have personally reviewed and noted the following in the patient's chart:   Medical and social  history Use of alcohol, tobacco or illicit drugs  Current medications and supplements including opioid prescriptions. Patient is not currently taking opioid prescriptions. Functional ability and status Nutritional status Physical activity Advanced directives List of other physicians Hospitalizations, surgeries, and ER visits in previous 12 months Vitals Screenings to include cognitive, depression, and falls Referrals and appointments  In addition, I have reviewed and discussed with patient certain preventive protocols, quality metrics, and best practice recommendations. A written personalized care plan for preventive services as well as general preventive health recommendations were provided to patient.     Randel Pigg, LPN   [id number]   Nurse Notes: none

## 2021-07-09 NOTE — Patient Instructions (Addendum)
Nicholas Burton , Thank you for taking time to come for your Medicare Wellness Visit. I appreciate your ongoing commitment to your health goals. Please review the following plan we discussed and let me know if I can assist you in the future.   Screening recommendations/referrals: Colonoscopy: no longer required  Recommended yearly ophthalmology/optometry visit for glaucoma screening and checkup Recommended yearly dental visit for hygiene and checkup  Vaccinations: Influenza vaccine: completed  Pneumococcal vaccine: completed  Tdap vaccine: 09/21/2020 Shingles vaccine: completed     Advanced directives: will provide copies   Conditions/risks identified: none   Next appointment: CPE 10/12/2020  Dr.[name]   Preventive Care 85 Years and Older, Male Preventive care refers to lifestyle choices and visits with your health care provider that can promote health and wellness. What does preventive care include? A yearly physical exam. This is also called an annual well check. Dental exams once or twice a year. Routine eye exams. Ask your health care provider how often you should have your eyes checked. Personal lifestyle choices, including: Daily care of your teeth and gums. Regular physical activity. Eating a healthy diet. Avoiding tobacco and drug use. Limiting alcohol use. Practicing safe sex. Taking low doses of aspirin every day. Taking vitamin and mineral supplements as recommended by your health care provider. What happens during an annual well check? The services and screenings done by your health care provider during your annual well check will depend on your age, overall health, lifestyle risk factors, and family history of disease. Counseling  Your health care provider may ask you questions about your: Alcohol use. Tobacco use. Drug use. Emotional well-being. Home and relationship well-being. Sexual activity. Eating habits. History of falls. Memory and ability to understand  (cognition). Work and work Statistician. Screening  You may have the following tests or measurements: Height, weight, and BMI. Blood pressure. Lipid and cholesterol levels. These may be checked every 5 years, or more frequently if you are over 54 years old. Skin check. Lung cancer screening. You may have this screening every year starting at age 85 if you have a 30-pack-year history of smoking and currently smoke or have quit within the past 15 years. Fecal occult blood test (FOBT) of the stool. You may have this test every year starting at age 85. Flexible sigmoidoscopy or colonoscopy. You may have a sigmoidoscopy every 5 years or a colonoscopy every 10 years starting at age 30. Prostate cancer screening. Recommendations will vary depending on your family history and other risks. Hepatitis C blood test. Hepatitis B blood test. Sexually transmitted disease (STD) testing. Diabetes screening. This is done by checking your blood sugar (glucose) after you have not eaten for a while (fasting). You may have this done every 1-3 years. Abdominal aortic aneurysm (AAA) screening. You may need this if you are a current or former smoker. Osteoporosis. You may be screened starting at age 85 if you are at high risk. Talk with your health care provider about your test results, treatment options, and if necessary, the need for more tests. Vaccines  Your health care provider may recommend certain vaccines, such as: Influenza vaccine. This is recommended every year. Tetanus, diphtheria, and acellular pertussis (Tdap, Td) vaccine. You may need a Td booster every 10 years. Zoster vaccine. You may need this after age 61. Pneumococcal 13-valent conjugate (PCV13) vaccine. One dose is recommended after age 30. Pneumococcal polysaccharide (PPSV23) vaccine. One dose is recommended after age 39. Talk to your health care provider about which screenings and  vaccines you need and how often you need them. This  information is not intended to replace advice given to you by your health care provider. Make sure you discuss any questions you have with your health care provider. Document Released: 09/11/2015 Document Revised: 05/04/2016 Document Reviewed: 06/16/2015 Elsevier Interactive Patient Education  2017 [employer] Prevention in the Home Falls can cause injuries. They can happen to people of all ages. There are many things you can do to make your home safe and to help prevent falls. What can I do on the outside of my home? Regularly fix the edges of walkways and driveways and fix any cracks. Remove anything that might make you trip as you walk through a door, such as a raised step or threshold. Trim any bushes or trees on the path to your home. Use bright outdoor lighting. Clear any walking paths of anything that might make someone trip, such as rocks or tools. Regularly check to see if handrails are loose or broken. Make sure that both sides of any steps have handrails. Any raised decks and porches should have guardrails on the edges. Have any leaves, snow, or ice cleared regularly. Use sand or salt on walking paths during winter. Clean up any spills in your garage right away. This includes oil or grease spills. What can I do in the bathroom? Use night lights. Install grab bars by the toilet and in the tub and shower. Do not use towel bars as grab bars. Use non-skid mats or decals in the tub or shower. If you need to sit down in the shower, use a plastic, non-slip stool. Keep the floor dry. Clean up any water that spills on the floor as soon as it happens. Remove soap buildup in the tub or shower regularly. Attach bath mats securely with double-sided non-slip rug tape. Do not have throw rugs and other things on the floor that can make you trip. What can I do in the bedroom? Use night lights. Make sure that you have a light by your bed that is easy to reach. Do not use any sheets or  blankets that are too big for your bed. They should not hang down onto the floor. Have a firm chair that has side arms. You can use this for support while you get dressed. Do not have throw rugs and other things on the floor that can make you trip. What can I do in the kitchen? Clean up any spills right away. Avoid walking on wet floors. Keep items that you use a lot in easy-to-reach places. If you need to reach something above you, use a strong step stool that has a grab bar. Keep electrical cords out of the way. Do not use floor polish or wax that makes floors slippery. If you must use wax, use non-skid floor wax. Do not have throw rugs and other things on the floor that can make you trip. What can I do with my stairs? Do not leave any items on the stairs. Make sure that there are handrails on both sides of the stairs and use them. Fix handrails that are broken or loose. Make sure that handrails are as long as the stairways. Check any carpeting to make sure that it is firmly attached to the stairs. Fix any carpet that is loose or worn. Avoid having throw rugs at the top or bottom of the stairs. If you do have throw rugs, attach them to the floor with carpet tape. Make  sure that you have a light switch at the top of the stairs and the bottom of the stairs. If you do not have them, ask someone to add them for you. What else can I do to help prevent falls? Wear shoes that: Do not have high heels. Have rubber bottoms. Are comfortable and fit you well. Are closed at the toe. Do not wear sandals. If you use a stepladder: Make sure that it is fully opened. Do not climb a closed stepladder. Make sure that both sides of the stepladder are locked into place. Ask someone to hold it for you, if possible. Clearly [place] and make sure that you can see: Any grab bars or handrails. First and last steps. Where the edge of each step is. Use tools that help you move around (mobility aids) if they are  needed. These include: Canes. Walkers. Scooters. Crutches. Turn on the lights when you go into a dark area. Replace any light bulbs as soon as they burn out. Set up your furniture so you have a clear path. Avoid moving your furniture around. If any of your floors are uneven, fix them. If there are any pets around you, be aware of where they are. Review your medicines with your doctor. Some medicines can make you feel dizzy. This can increase your chance of falling. Ask your doctor what other things that you can do to help prevent falls. This information is not intended to replace advice given to you by your health care provider. Make sure you discuss any questions you have with your health care provider. Document Released: 06/11/2009 Document Revised: 01/21/2016 Document Reviewed: 09/19/2014 Elsevier Interactive Patient Education  2017 [employer].

## 2021-07-13 ENCOUNTER — Telehealth: Payer: Self-pay | Admitting: *Deleted

## 2021-07-13 NOTE — Chronic Care Management (AMB) (Signed)
  Chronic Care Management   Note  07/13/2021 Name: [name] MRN: [id number] DOB: Dec 15, 1927  Nicholas Burton is a 87 y.o. year old male who is a primary care patient of [name], MD. I reached out to Nicholas Burton by phone today in response to a referral sent by [name]'s PCP.  [name] was given information about Chronic Care Management services today including:  CCM service includes personalized support from designated clinical staff supervised by his physician, including individualized plan of care and coordination with other care providers 24/7 contact phone numbers for assistance for urgent and routine care needs. Service will only be billed when office clinical staff spend 20 minutes or more in a month to coordinate care. Only one practitioner may furnish and bill the service in a calendar month. The patient may stop CCM services at any time (effective at the end of the month) by phone call to the office staff. The patient is responsible for co-pay (up to 20% after annual deductible is met) if co-pay is required by the individual health plan.   Patient agreed to services and verbal consent obtained.   Follow up plan: Telephone appointment with care management team member scheduled for:07/21/21  [place] [facility]  Direct Dial: [phone]

## 2021-07-14 ENCOUNTER — Telehealth: Payer: Self-pay | Admitting: Lab

## 2021-07-14 NOTE — Chronic Care Management (AMB) (Signed)
  Chronic Care Management   Outreach Note  07/14/2021 Name: KEEVIN PANEBIANCO MRN: [id number] DOB: 12/20/1927  Referred by: [name], MD Reason for referral : Medication Management   An unsuccessful telephone outreach was attempted today. The patient was referred to the pharmacist for assistance with care management and care coordination.   Follow Up Plan:   [place]

## 2021-07-15 ENCOUNTER — Telehealth: Payer: Self-pay | Admitting: Lab

## 2021-07-15 NOTE — Chronic Care Management (AMB) (Signed)
  Chronic Care Management   Note  07/15/2021 Name: [name] MRN: [id number] DOB: 12/06/1927  [name] is a 87 y.o. year old male who is a primary care patient of [name], MD. I reached out to [name] by phone today in response to a referral sent by Mr. Kyriakos Babler Risdon's PCP, [name], MD.   Mr. Lemay was given information about Chronic Care Management services today including:  CCM service includes personalized support from designated clinical staff supervised by his physician, including individualized plan of care and coordination with other care providers 24/7 contact phone numbers for assistance for urgent and routine care needs. Service will only be billed when office clinical staff spend 20 minutes or more in a month to coordinate care. Only one practitioner may furnish and bill the service in a calendar month. The patient may stop CCM services at any time (effective at the end of the month) by phone call to the office staff.   Arville Go verbally agreed to assistance and services provided by embedded care coordination/care management team today.  Follow up plan: [place]

## 2021-07-21 ENCOUNTER — Ambulatory Visit (INDEPENDENT_AMBULATORY_CARE_PROVIDER_SITE_OTHER): Payer: Medicare Other | Admitting: *Deleted

## 2021-07-21 DIAGNOSIS — I5042 Chronic combined systolic (congestive) and diastolic (congestive) heart failure: Secondary | ICD-10-CM

## 2021-07-21 DIAGNOSIS — E118 Type 2 diabetes mellitus with unspecified complications: Secondary | ICD-10-CM

## 2021-07-21 NOTE — Patient Instructions (Signed)
Visit Information   Nicholas Burton, thank you for taking time to talk with me today. Please don't hesitate to contact me if I can be of assistance to you before our next scheduled telephone appointment.  Below are the goals we discussed today:  Patient Self-Care Activities: Patient Nicholas Burton will: Self administer medications as prescribed Attend all scheduled provider appointments Call pharmacy for medication refills Call provider office for new concerns or questions Begin checking fasting (first thing in the morning, before eating) blood sugars, and then after-eating blood sugars, 2 hours after a regular meal: please read over attached printed education around your blood sugar goals for this monitoring schedule Write down on paper your blood sugars so we can review them together the next time we talk Consider checking your weights at home and writing them down on paper or your calendar, several times a week: overnight weight gain of 3 or more pounds is the earliest indicator that you may be retaining fluid Try to take steps to better follow heart healthy, low salt, low cholesterol, carbohydrate-modified, low sugar diet Review enclosed educational material   Our next appointment is by telephone on Thursday September 09, 2021 at 2:00 pm  Please call the care guide team at [phone] if you need to cancel or reschedule your appointment.   [name], RN, BSN, [facility] RN Care Coordination- [place] [phone]: direct office   Diabetes Mellitus Basics Diabetes mellitus, or diabetes, is a long-term (chronic) disease. It occurs when the body does not properly use sugar (glucose) that is released from food after you eat. Diabetes mellitus may be caused by one or both of these problems: Your pancreas does not make enough of a hormone called insulin. Your body does not react in a normal way to the insulin that it makes. Insulin lets glucose enter cells in your  body. This gives you energy. If you have diabetes, glucose cannot get into cells. This causes high blood glucose (hyperglycemia). How to treat and manage diabetes You may need to take insulin or other diabetes medicines daily to keep your glucose in balance. If you are prescribed insulin, you will learn how to give yourself insulin by injection. You may need to adjust the amount of insulin you take based on the foods that you eat. You will need to check your blood glucose levels using a glucose monitor as told by your health care provider. The readings can help determine if you have low or high blood glucose. Generally, you should have these blood glucose levels: Before meals (preprandial): 80-130 mg/dL (4.4-7.2 mmol/L). After meals (postprandial): below 180 mg/dL (10 mmol/L). Hemoglobin A1c (HbA1c) level: less than 7%. Your health care provider will set treatment goals for you. Keep all follow-up visits. This is important. Follow these instructions at home: Diabetes medicines Take your diabetes medicines every day as told by your health care provider. List your diabetes medicines here: Name of medicine: ______________________________ Amount (dose): _______________ Time (a.m./p.m.): _______________ Notes: ___________________________________ Name of medicine: ______________________________ Amount (dose): _______________ Time (a.m./p.m.): _______________ Notes: ___________________________________ Name of medicine: ______________________________ Amount (dose): _______________ Time (a.m./p.m.): _______________ Notes: ___________________________________ Insulin If you use insulin, list the types of insulin you use here: Insulin type: ______________________________ Amount (dose): _______________ Time (a.m./p.m.): _______________Notes: ___________________________________ Insulin type: ______________________________ Amount (dose): _______________ Time (a.m./p.m.): _______________ Notes:  ___________________________________ Insulin type: ______________________________ Amount (dose): _______________ Time (a.m./p.m.): _______________ Notes: ___________________________________ Insulin type: ______________________________ Amount (dose): _______________ Time (a.m./p.m.): _______________ Notes: ___________________________________ Insulin type: ______________________________  Amount (dose): _______________ Time (a.m./p.m.): _______________ Notes: ___________________________________ Managing blood glucose Check your blood glucose levels using a glucose monitor as told by your health care provider. Write down the times that you check your glucose levels here: Time: _______________ Notes: ___________________________________ Time: _______________ Notes: ___________________________________ Time: _______________ Notes: ___________________________________ Time: _______________ Notes: ___________________________________ Time: _______________ Notes: ___________________________________ Time: _______________ Notes: ___________________________________  Low blood glucose Low blood glucose (hypoglycemia) is when glucose is at or below 70 mg/dL (3.9 mmol/L). Symptoms may include: Feeling: Hungry. Sweaty and clammy. Irritable or easily upset. Dizzy. Sleepy. Having: A fast heartbeat. A headache. A change in your vision. Numbness around the mouth, lips, or tongue. Having trouble with: Moving (coordination). Sleeping. Treating low blood glucose To treat low blood glucose, eat or drink something containing sugar right away. If you can think clearly and swallow safely, follow the 15:15 rule: Take 15 grams of a fast-acting carb (carbohydrate), as told by your health care provider. Some fast-acting carbs are: Glucose tablets: take 3-4 tablets. Hard candy: eat 3-5 pieces. Fruit juice: drink 4 oz (120 mL). Regular (not diet) soda: drink 4-6 oz (120-180 mL). Honey or sugar: eat 1 Tbsp (15  mL). Check your blood glucose levels 15 minutes after you take the carb. If your glucose is still at or below 70 mg/dL (3.9 mmol/L), take 15 grams of a carb again. If your glucose does not go above 70 mg/dL (3.9 mmol/L) after 3 tries, get help right away. After your glucose goes back to normal, eat a meal or a snack within 1 hour. Treating very low blood glucose If your glucose is at or below 54 mg/dL (3 mmol/L), you have very low blood glucose (severe hypoglycemia). This is an emergency. Do not wait to see if the symptoms will go away. Get medical help right away. Call your local emergency services (911 in the [place] Do not drive yourself to the hospital. Questions to ask your health care provider Should I talk with a diabetes educator? What equipment will I need to care for myself at home? What diabetes medicines do I need? When should I take them? How often do I need to check my blood glucose levels? What number can I call if I have questions? When is my follow-up visit? Where can I find a support group for people with diabetes? Where to find more information American Diabetes Association: www.diabetes.org Association of Diabetes Care and Education Specialists: www.diabeteseducator.org Contact a health care provider if: Your blood glucose is at or above 240 mg/dL (13.3 mmol/L) for 2 days in a row. You have been sick or have had a fever for 2 days or more, and you are not getting better. You have any of these problems for more than 6 hours: You cannot eat or drink. You feel nauseous. You vomit. You have diarrhea. Get help right away if: Your blood glucose is lower than 54 mg/dL (3 mmol/L). You get confused. You have trouble thinking clearly. You have trouble breathing. These symptoms may represent a serious problem that is an emergency. Do not wait to see if the symptoms will go away. Get medical help right away. Call your local emergency services (911 in the [place] Do not drive  yourself to the hospital. Summary Diabetes mellitus is a chronic disease that occurs when the body does not properly use sugar (glucose) that is released from food after you eat. Take insulin and diabetes medicines as told. Check your blood glucose every day, as often as told. Keep all follow-up  visits. This is important. This information is not intended to replace advice given to you by your health care provider. Make sure you discuss any questions you have with your health care provider. Document Revised: 12/17/2019 Document Reviewed: 12/17/2019 Elsevier Patient Education  2022 [employer].  Heart Failure Action Plan A heart failure action plan helps you understand what to do when you have symptoms of heart failure. Your action plan is a color-coded plan that lists the symptoms to watch for and indicates what actions to take. If you have symptoms in the red zone, you need medical care right away. If you have symptoms in the yellow zone, you are having problems. If you have symptoms in the green zone, you are doing well. Follow the plan that was created by you and your health care provider. Review your plan each time you visit your health care provider. Red zone These signs and symptoms mean you should get medical help right away: You have trouble breathing when resting. You have a dry cough that is getting worse. You have swelling or pain in your legs or abdomen that is getting worse. You suddenly gain more than 2-3 lb (0.9-1.4 kg) in 24 hours, or more than 5 lb (2.3 kg) in a week. This amount may be more or less depending on your condition. You have trouble staying awake or you feel confused. You have chest pain. You do not have an appetite. You pass out. You have worsening sadness or depression. If you have any of these symptoms, call your local emergency services (911 in the [place] right away. Do not drive yourself to the hospital. Yellow zone These signs and symptoms mean your  condition may be getting worse and you should make some changes: You have trouble breathing when you are active, or you need to sleep with your head raised on extra pillows to help you breathe. You have swelling in your legs or abdomen. You gain 2-3 lb (0.9-1.4 kg) in 24 hours, or 5 lb (2.3 kg) in a week. This amount may be more or less depending on your condition. You get tired easily. You have trouble sleeping. You have a dry cough. If you have any of these symptoms: Contact your health care provider within the next day. Your health care provider may adjust your medicines. Green zone These signs mean you are doing well and can continue what you are doing: You do not have shortness of breath. You have very little swelling or no new swelling. Your weight is stable (no gain or loss). You have a normal activity level. You do not have chest pain or any other new symptoms. Follow these instructions at home: Take over-the-counter and prescription medicines only as told by your health care provider. Weigh yourself daily. Your target weight is __________ lb (__________ kg). Call your health care provider if you gain more than __________ lb (__________ kg) in 24 hours, or more than __________ lb (__________ kg) in a week. Health care provider name: _____________________________________________________ Health care provider phone number: _____________________________________________________ Eat a heart-healthy diet. Work with a diet and nutrition specialist (dietitian) to create an eating plan that is best for you. Keep all follow-up visits. This is important. Where to find more information American Heart Association: www.heart.org Summary A heart failure action plan helps you understand what to do when you have symptoms of heart failure. Follow the action plan that was created by you and your health care provider. Get help right away if you have any  symptoms in the red zone. This information is  not intended to replace advice given to you by your health care provider. Make sure you discuss any questions you have with your health care provider. Document Revised: 03/30/2020 Document Reviewed: 03/30/2020 Elsevier Patient Education  2022 [employer].   Following is a copy of your full care plan:  Care Plan : RN Care Manager Plan of Care  Updates made by Knox Royalty, RN since 07/21/2021 12:00 AM     Problem: Chronic Disease Management Needs   Priority: High     Long-Range Goal: Development of plan of care for long term chronic disease management   Start Date: 07/21/2021  Expected End Date: 07/21/2022  Priority: High  Note:   Current Barriers:  Chronic Disease Management support and education needs related to CHF and DMII Hard of hearing: bilateral hearing aids Vision concerns: macular degeneration; gets regular retinal injections  RNCM Clinical Goal(s):  Patient will demonstrate ongoing health management independence as evidenced by adherence to plan of care for DMII, CHF        through collaboration with RN Care manager, provider, and care team.   Interventions: 1:1 collaboration with primary care provider regarding development and update of comprehensive plan of care as evidenced by provider attestation and co-signature Inter-disciplinary care team collaboration (see longitudinal plan of care) Evaluation of current treatment plan related to  self management and patient's adherence to plan as established by provider  Heart Failure Interventions:  (Status: New goal.)  Long Term Goal  Basic overview and discussion of pathophysiology of Heart Failure reviewed Provided education on low sodium diet Reviewed Heart Failure Action Plan in depth and provided written copy Assessed need for readable accurate scales in home Provided education about placing scale on hard, flat surface Advised patient to weigh each morning after emptying bladder Discussed importance of daily  weight and advised patient to weigh and record daily Reviewed role of diuretics in prevention of fluid overload and management of heart failure Discussed the importance of keeping all appointments with provider Provided patient with education about the role of exercise in the management of heart failure Assessed social determinant of health barriers Patient repots does not monitor/ record daily weights at home: discussed/ provided education around general guidelines for weight monitoring at home in setting of CHF: explained early vs. late signs/ symptoms of fluid retention; he reports that he rarely has lower extremity swelling; confirms takes diuretic as prescribed Initiated education around signs/ symptoms yellow CHF zone along with corresponding action plan, encouraged patient to consider beginning monitoring weight at home at least several times per week- he will consider  Diabetes:  (Status: New goal.) Long Term Goal   Lab Results  Component Value Date   HGBA1C 7.3 (H) 04/22/2021  Assessed patient's understanding of A1c goal: <7% Provided education to patient about basic DM disease process; Reviewed medications with patient and discussed importance of medication adherence;        Reviewed prescribed diet with patient heart healthy, low sodium, carbohydrate modified, low sugar; Counseled on importance of regular laboratory monitoring as prescribed;        Discussed plans with patient for ongoing care management follow up and provided patient with direct contact information for care management team;      Reviewed scheduled/upcoming provider appointments including: 08/17/21- CCM Pharmacy team;         Review of patient status, including review of consultants reports, relevant laboratory and other test results, and medications completed;  Reviewed individual historical A1-C trends and provided education around correlation of A1-C value to blood sugar levels at home over 3 months: patient  and spouse have fair understanding of rationale/ significance of A1-C results Confirmed patient monitors and writes down on paper blood sugars at home: however- he reports monitoring blood sugars immediately after meals (breakfast, and then again at HS after nighttime snack) Provided education/ rationale around recommended timing of blood sugar monitoring: fasting am; 2-hours post-prandial: patient will consider monitoring during these time frames in the future Reviewed recent (immediately) post-prandial blood sugars at home: he reports general ranges between 130-300: he wonders why his blood sugars are so high-- I explained it is most ;likely due to the timing of his monitoring/ directly after eating Patient admits he is not very adherent to following prescribed diet: provided education/ discussed benefit of making small changes to diet for lasting change Confirmed patient self-administers insulin: uses 30 U QD long acting insulin Medication review completed; no concerns or discrepancies identified; patient denies questions about current medications and verbalizes a good general understanding of the purpose, dosing, and scheduling of prescribed medications; takes directly from bottle; wife assists with medications due to visual impairment Confirmed patient stays active at home- benefits of activity discussed for overall state of health  Patient Goals/Self-Care Activities: As evidenced by review of EHR, collaboration with care team, and patient reporting during CCM RN CM outreach, Patient Nicholas Burton will: Self administer medications as prescribed Attend all scheduled provider appointments Call pharmacy for medication refills Call provider office for new concerns or questions Begin checking fasting (first thing in the morning, before eating) blood sugars, and then after-eating blood sugars, 2 hours after a regular meal: please read over attached printed education around your blood sugar goals for this  monitoring schedule Write down on paper your blood sugars so we can review them together the next time we talk Consider checking your weights at home and writing them down on paper or your calendar, several times a week: overnight weight gain of 3 or more pounds is the earliest indicator that you may be retaining fluid Try to take steps to better follow heart healthy, low salt, low cholesterol, carbohydrate-modified, low sugar diet Review enclosed educational material       Consent to CCM Services: Nicholas Burton was given information about Chronic Care Management services 07/13/21 including:  CCM service includes personalized support from designated clinical staff supervised by his physician, including individualized plan of care and coordination with other care providers 24/7 contact phone numbers for assistance for urgent and routine care needs. Service will only be billed when office clinical staff spend 20 minutes or more in a month to coordinate care. Only one practitioner may furnish and bill the service in a calendar month. The patient may stop CCM services at any time (effective at the end of the month) by phone call to the office staff. The patient will be responsible for cost sharing (co-pay) of up to 20% of the service fee (after annual deductible is met).  Patient agreed to services and verbal consent obtained.   The patient verbalized understanding of instructions, educational materials, and care plan provided today and agreed to receive a mailed copy of patient instructions, educational materials, and care plan Telephone follow up appointment with care management team member scheduled for:  Thursday, September 09, 2020 at 2:00 pm The patient has been provided with contact information for the care management team and has been advised to call with any health  related questions or concerns  [name], RN, BSN, [place] [phone]: direct office

## 2021-07-21 NOTE — Chronic Care Management (AMB) (Signed)
Chronic Care Management   CCM RN Visit Note  07/21/2021 Name: Nicholas Burton MRN: [id number] DOB: 1928-03-13  Subjective: Nicholas Burton is a 87 y.o. year old male who is a primary care patient of [name], MD. The care management team was consulted for assistance with disease management and care coordination needs.    Engaged with patient by telephone for initial visit in response to provider referral for case management and/or care coordination services.   Consent to Services:  The patient was given information about Chronic Care Management services, agreed to services, and gave verbal consent 07/13/21 prior to initiation of services.  Please see initial visit note for detailed documentation.  Patient agreed to services and verbal consent obtained.   Assessment: Review of patient past medical history, allergies, medications, health status, including review of consultants reports, laboratory and other test data, was performed as part of comprehensive evaluation and provision of chronic care management services.   SDOH (Social Determinants of Health) assessments and interventions performed:  SDOH Interventions    Flowsheet Row Most Recent Value  SDOH Interventions   Food Insecurity Interventions Intervention Not Indicated  Housing Interventions Intervention Not Indicated  [single family home,  live on main level,  lived in same home for 40 years]  Transportation Interventions Intervention Not Indicated  [family provides transportation- wife no longer driving]      CCM Care Plan  Allergies  Allergen Reactions   Ace Inhibitors     REACTION: cough   Oxycodone-Acetaminophen    Penicillins    Outpatient Encounter Medications as of 07/21/2021  Medication Sig   atorvastatin (LIPITOR) 10 MG tablet Take 1 tablet (10 mg total) by mouth daily.   cholecalciferol (VITAMIN D) 1000 units tablet Take 1,000 Units by mouth daily.   Colesevelam HCl 3.75 g PACK MIX THE CONTENTS OF 1 PACKET IN  FLUID AND DRINK DAILY   cyanocobalamin 1000 MCG tablet Take 1,000 mcg by mouth daily.   Dietary Management Product (VASCULERA) TABS Take 1 tablet by mouth daily.   Glucagon (GVOKE HYPOPEN 2-PACK) 1 MG/0.2ML SOAJ Inject 1 Act into the skin daily as needed.   LANTUS SOLOSTAR 100 UNIT/ML Solostar Pen INJECT 0.3ML (30 UNITS TOTAL) INTO THE SKIN AT BEDTIME   losartan (COZAAR) 25 MG tablet Take 1 tablet (25 mg total) by mouth daily.   metFORMIN (GLUCOPHAGE) 500 MG tablet TAKE 1 TABLET TWICE A DAY WITH MEALS   metoprolol tartrate (LOPRESSOR) 25 MG tablet TAKE ONE-HALF (1/2) TABLET BY MOUTH TWICE A DAY   Multiple Vitamins-Minerals (PRESERVISION/LUTEIN) CAPS Take 1 capsule by mouth 2 (two) times daily.   torsemide (DEMADEX) 10 MG tablet Take 1 tablet (10 mg total) by mouth daily.   Vibegron (GEMTESA) 75 MG TABS Take 1 tablet by mouth daily.   Blood Glucose Monitoring Suppl (FREESTYLE FREEDOM LITE) W/DEVICE KIT Use to check blood sugars daily Dx E11.8 (Patient not taking: Reported on 07/21/2021)   Continuous Blood Gluc Receiver (FREESTYLE LIBRE 2 READER) DEVI 1 Act by Does not apply route daily. (Patient not taking: Reported on 07/21/2021)   Continuous Blood Gluc Sensor (FREESTYLE LIBRE 2 SENSOR) MISC 1 Act by Does not apply route daily. (Patient not taking: Reported on 07/21/2021)   FREESTYLE LITE test strip USE AS INSTRUCTED TO CHECK BLOOD SUGAR TWICE A DAY (Patient not taking: Reported on 07/21/2021)   Insulin Pen Needle (B-D ULTRAFINE III [place] PEN) 31G X 8 MM MISC USE TO INJECT INSULIN DAILY   Lancets (FREESTYLE) lancets  Use to check blood sugars twice day Dx E11.8 (Patient not taking: Reported on 07/21/2021)   predniSONE (DELTASONE) 10 MG tablet 2 tabs by mouth per day for 5 days (Patient not taking: Reported on 07/21/2021)   No facility-administered encounter medications on file as of 07/21/2021.   Patient Active Problem List   Diagnosis Date Noted   URI (upper respiratory infection) 06/02/2021    Hearing loss secondary to cerumen impaction, left 09/21/2020   Late onset Alzheimer's dementia without behavioral disturbance ([place]) 09/21/2020   Routine general medical examination at a health care facility 09/17/2019   Iron deficiency anemia secondary to inadequate dietary iron intake 06/06/2019   Moderate persistent asthma with acute exacerbation 05/16/2019   Chronic combined systolic and diastolic congestive heart failure ([place]) 10/30/2018   PSA elevation 10/25/2018   CRI (chronic renal insufficiency), stage 3 (moderate) 10/25/2018   Venous stasis dermatitis of both lower extremities 01/29/2018   COPD with asthma ([place]) 03/02/2017   H/O melanoma excision 12/02/2013   Obesity (BMI 30.0-34.9) 04/01/2013   BPH (benign prostatic hyperplasia) 09/08/2011   B12 deficiency 08/13/2010   Type II diabetes mellitus with manifestations ([place]) 11/10/2008   Vitamin D deficiency 11/10/2008   Hyperlipidemia with target LDL less than 100 11/10/2008   Essential hypertension 11/10/2008   Osteoarthritis 11/10/2008   Conditions to be addressed/monitored:  CHF and DMII  Care Plan : RN Care Manager Plan of Care  Updates made by Knox Royalty, RN since 07/21/2021 12:00 AM     Problem: Chronic Disease Management Needs   Priority: High     Long-Range Goal: Development of plan of care for long term chronic disease management   Start Date: 07/21/2021  Expected End Date: 07/21/2022  Priority: High  Note:   Current Barriers:  Chronic Disease Management support and education needs related to CHF and DMII Hard of hearing: bilateral hearing aids Vision concerns: macular degeneration; gets regular retinal injections  RNCM Clinical Goal(s):  Patient will demonstrate ongoing health management independence as evidenced by adherence to plan of care for DMII, CHF        through collaboration with RN Care manager, provider, and care team.   Interventions: 1:1 collaboration with primary care provider  regarding development and update of comprehensive plan of care as evidenced by provider attestation and co-signature Inter-disciplinary care team collaboration (see longitudinal plan of care) Evaluation of current treatment plan related to  self management and patient's adherence to plan as established by provider  Heart Failure Interventions:  (Status: New goal.)  Long Term Goal  Basic overview and discussion of pathophysiology of Heart Failure reviewed Provided education on low sodium diet Reviewed Heart Failure Action Plan in depth and provided written copy Assessed need for readable accurate scales in home Provided education about placing scale on hard, flat surface Advised patient to weigh each morning after emptying bladder Discussed importance of daily weight and advised patient to weigh and record daily Reviewed role of diuretics in prevention of fluid overload and management of heart failure Discussed the importance of keeping all appointments with provider Provided patient with education about the role of exercise in the management of heart failure Assessed social determinant of health barriers Patient repots does not monitor/ record daily weights at home: discussed/ provided education around general guidelines for weight monitoring at home in setting of CHF: explained early vs. late signs/ symptoms of fluid retention; he reports that he rarely has lower extremity swelling; confirms takes diuretic as prescribed Initiated education around  signs/ symptoms yellow CHF zone along with corresponding action plan, encouraged patient to consider beginning monitoring weight at home at least several times per week- he will consider  Diabetes:  (Status: New goal.) Long Term Goal   Lab Results  Component Value Date   HGBA1C 7.3 (H) 04/22/2021  Assessed patient's understanding of A1c goal: <7% Provided education to patient about basic DM disease process; Reviewed medications with patient and  discussed importance of medication adherence;        Reviewed prescribed diet with patient heart healthy, low sodium, carbohydrate modified, low sugar; Counseled on importance of regular laboratory monitoring as prescribed;        Discussed plans with patient for ongoing care management follow up and provided patient with direct contact information for care management team;      Reviewed scheduled/upcoming provider appointments including: 08/17/21- CCM Pharmacy team;         Review of patient status, including review of consultants reports, relevant laboratory and other test results, and medications completed;       Reviewed individual historical A1-C trends and provided education around correlation of A1-C value to blood sugar levels at home over 3 months: patient and spouse have fair understanding of rationale/ significance of A1-C results Confirmed patient monitors and writes down on paper blood sugars at home: however- he reports monitoring blood sugars immediately after meals (breakfast, and then again at HS after nighttime snack) Provided education/ rationale around recommended timing of blood sugar monitoring: fasting am; 2-hours post-prandial: patient will consider monitoring during these time frames in the future Reviewed recent (immediately) post-prandial blood sugars at home: he reports general ranges between 130-300: he wonders why his blood sugars are so high-- I explained it is most ;likely due to the timing of his monitoring/ directly after eating Patient admits he is not very adherent to following prescribed diet: provided education/ discussed benefit of making small changes to diet for lasting change Confirmed patient self-administers insulin: uses 30 U QD long acting insulin Medication review completed; no concerns or discrepancies identified; patient denies questions about current medications and verbalizes a good general understanding of the purpose, dosing, and scheduling of  prescribed medications; takes directly from bottle; wife assists with medications due to visual impairment Confirmed patient stays active at home- benefits of activity discussed for overall state of health  Patient Goals/Self-Care Activities: As evidenced by review of EHR, collaboration with care team, and patient reporting during CCM RN CM outreach, Patient Joeph will: Self administer medications as prescribed Attend all scheduled provider appointments Call pharmacy for medication refills Call provider office for new concerns or questions Begin checking fasting (first thing in the morning, before eating) blood sugars, and then after-eating blood sugars, 2 hours after a regular meal: please read over attached printed education around your blood sugar goals for this monitoring schedule Write down on paper your blood sugars so we can review them together the next time we talk Consider checking your weights at home and writing them down on paper or your calendar, several times a week: overnight weight gain of 3 or more pounds is the earliest indicator that you may be retaining fluid Try to take steps to better follow heart healthy, low salt, low cholesterol, carbohydrate-modified, low sugar diet Review enclosed educational material       Plan: Telephone follow up appointment with care management team member scheduled for:  Thursday, September 09, 2021 at 2:00 pm The patient has been provided with contact information for the  care management team and has been advised to call with any health related questions or concerns   [name], RN, BSN, [place] [phone]: direct office [phone]: mobile

## 2021-07-28 DIAGNOSIS — H3540 Unspecified peripheral retinal degeneration: Secondary | ICD-10-CM | POA: Diagnosis not present

## 2021-07-28 DIAGNOSIS — I5042 Chronic combined systolic (congestive) and diastolic (congestive) heart failure: Secondary | ICD-10-CM

## 2021-07-28 DIAGNOSIS — H353211 Exudative age-related macular degeneration, right eye, with active choroidal neovascularization: Secondary | ICD-10-CM | POA: Diagnosis not present

## 2021-07-28 DIAGNOSIS — H43813 Vitreous degeneration, bilateral: Secondary | ICD-10-CM | POA: Diagnosis not present

## 2021-07-28 DIAGNOSIS — H353223 Exudative age-related macular degeneration, left eye, with inactive scar: Secondary | ICD-10-CM | POA: Diagnosis not present

## 2021-07-28 DIAGNOSIS — E118 Type 2 diabetes mellitus with unspecified complications: Secondary | ICD-10-CM

## 2021-08-04 DIAGNOSIS — Z515 Encounter for palliative care: Secondary | ICD-10-CM | POA: Diagnosis not present

## 2021-08-04 DIAGNOSIS — I5040 Unspecified combined systolic (congestive) and diastolic (congestive) heart failure: Secondary | ICD-10-CM | POA: Diagnosis not present

## 2021-08-04 DIAGNOSIS — H3530 Unspecified macular degeneration: Secondary | ICD-10-CM | POA: Diagnosis not present

## 2021-08-04 DIAGNOSIS — H9110 Presbycusis, unspecified ear: Secondary | ICD-10-CM | POA: Diagnosis not present

## 2021-08-04 DIAGNOSIS — J449 Chronic obstructive pulmonary disease, unspecified: Secondary | ICD-10-CM | POA: Diagnosis not present

## 2021-08-04 DIAGNOSIS — F0390 Unspecified dementia without behavioral disturbance: Secondary | ICD-10-CM | POA: Diagnosis not present

## 2021-08-04 DIAGNOSIS — E10641 Type 1 diabetes mellitus with hypoglycemia with coma: Secondary | ICD-10-CM | POA: Diagnosis not present

## 2021-08-09 ENCOUNTER — Telehealth: Payer: Self-pay

## 2021-08-09 NOTE — Progress Notes (Signed)
Chronic Care Management Pharmacy Assistant   Name: Nicholas Burton  MRN: [id number] DOB: 13-Apr-1928  Nicholas Burton is an 87 y.o. year old male who presents for his initial CCM visit with the clinical pharmacist.  Reason for Encounter: Initial Visit   Recent office visits:  06/02/21 Nicholas Borg, MD-Internal Medicine (Upper respiratory tract infection, unspecified type) Orders: DG Chest 2 View, Med changes: Hydrocodone 5-1.5mg /ml, levofloxacin 500 mg, prednisone 10 mg  04/22/21 Nicholas Lima, MD-PCP ([place], Diabetes) Labs ordered, med changes: discontinued aspirin EC, revefenacin, and insulin glargine, start on insulin glargine  Recent consult visits:  07/02/21 Nicholas Booze, MD-Cardiology (Chronic combined systolic and diastolic congestive heart failure) Orders: EKG, and no med changes  Hospital visits:  None in previous 6 months  Medications: Outpatient Encounter Medications as of 08/09/2021  Medication Sig   atorvastatin (LIPITOR) 10 MG tablet Take 1 tablet (10 mg total) by mouth daily.   Blood Glucose Monitoring Suppl (FREESTYLE FREEDOM LITE) W/DEVICE KIT Use to check blood sugars daily Dx E11.8 (Patient not taking: Reported on 07/21/2021)   cholecalciferol (VITAMIN D) 1000 units tablet Take 1,000 Units by mouth daily.   Colesevelam HCl 3.75 g PACK MIX THE CONTENTS OF 1 PACKET IN FLUID AND DRINK DAILY   Continuous Blood Gluc Receiver (FREESTYLE LIBRE 2 READER) DEVI 1 Act by Does not apply route daily. (Patient not taking: Reported on 07/21/2021)   Continuous Blood Gluc Sensor (FREESTYLE LIBRE 2 SENSOR) MISC 1 Act by Does not apply route daily. (Patient not taking: Reported on 07/21/2021)   cyanocobalamin 1000 MCG tablet Take 1,000 mcg by mouth daily.   Dietary Management Product (VASCULERA) TABS Take 1 tablet by mouth daily.   FREESTYLE LITE test strip USE AS INSTRUCTED TO CHECK BLOOD SUGAR TWICE A DAY (Patient not taking: Reported on 07/21/2021)   Glucagon (GVOKE HYPOPEN  2-PACK) 1 MG/0.2ML SOAJ Inject 1 Act into the skin daily as needed.   Insulin Pen Needle (B-D ULTRAFINE III [place] PEN) 31G X 8 MM MISC USE TO INJECT INSULIN DAILY   Lancets (FREESTYLE) lancets Use to check blood sugars twice day Dx E11.8 (Patient not taking: Reported on 07/21/2021)   LANTUS SOLOSTAR 100 UNIT/ML Solostar Pen INJECT 0.3ML (30 UNITS TOTAL) INTO THE SKIN AT BEDTIME   losartan (COZAAR) 25 MG tablet Take 1 tablet (25 mg total) by mouth daily.   metFORMIN (GLUCOPHAGE) 500 MG tablet TAKE 1 TABLET TWICE A DAY WITH MEALS   metoprolol tartrate (LOPRESSOR) 25 MG tablet TAKE ONE-HALF (1/2) TABLET BY MOUTH TWICE A DAY   Multiple Vitamins-Minerals (PRESERVISION/LUTEIN) CAPS Take 1 capsule by mouth 2 (two) times daily.   predniSONE (DELTASONE) 10 MG tablet 2 tabs by mouth per day for 5 days (Patient not taking: Reported on 07/21/2021)   torsemide (DEMADEX) 10 MG tablet Take 1 tablet (10 mg total) by mouth daily.   Vibegron (GEMTESA) 75 MG TABS Take 1 tablet by mouth daily.   No facility-administered encounter medications on file as of 08/09/2021.   Medication List: atorvastatin (LIPITOR) 10 MG tablet-last fill 05/28/21 90 ds Blood Glucose Monitoring Suppl (FREESTYLE FREEDOM LITE) W/DEVICE KIT cholecalciferol (VITAMIN D) 1000 units tablet Colesevelam HCl 3.75 g PACK-last fill 06/23/21 90 ds Continuous Blood Gluc Receiver (FREESTYLE LIBRE 2 READER) DEVI Continuous Blood Gluc Sensor (FREESTYLE LIBRE 2 SENSOR) MISC cyanocobalamin 1000 MCG tablet Dietary Management Product (VASCULERA) TABS FREESTYLE LITE test strip Glucagon (GVOKE HYPOPEN 2-PACK) 1 MG/0.2ML SOAJ-last fill 12/21/20 2 ds Insulin Pen Needle (  B-D ULTRAFINE III [place] PEN) 31G X 8 MM MISC Lancets (FREESTYLE) lancets LANTUS SOLOSTAR 100 UNIT/ML Solostar Pen-last fill 06/23/21 90 ds losartan (COZAAR) 25 MG tablet-last fill 05/31/21 90 ds metFORMIN (GLUCOPHAGE) 500 MG tablet-last fill 05/28/21 90 ds metoprolol tartrate (LOPRESSOR) 25  MG tablet-last fill 06/01/21 90 ds Multiple Vitamins-Minerals (PRESERVISION/LUTEIN) CAPS predniSONE (DELTASONE) 10 MG tablet-last fill 06/02/21 5 ds torsemide (DEMADEX) 10 MG tablet-last fill 05/28/21 90 ds Vibegron (GEMTESA) 75 MG TABS-last fill 06/11/21 90 ds  Care Gaps: Colonoscopy-NA Diabetic Foot Exam-09/17/19 Ophthalmology-09/05/19 Dexa Scan - NA Annual Well Visit - NA Micro albumin-NA Hemoglobin A1c- 04/22/21  Star Rating Drugs:   Ethelene Hal Clinical Pharmacist Assistant [phone]

## 2021-08-17 ENCOUNTER — Other Ambulatory Visit: Payer: Self-pay

## 2021-08-17 ENCOUNTER — Ambulatory Visit (INDEPENDENT_AMBULATORY_CARE_PROVIDER_SITE_OTHER): Payer: Medicare Other

## 2021-08-17 DIAGNOSIS — E119 Type 2 diabetes mellitus without complications: Secondary | ICD-10-CM

## 2021-08-17 DIAGNOSIS — E118 Type 2 diabetes mellitus with unspecified complications: Secondary | ICD-10-CM

## 2021-08-17 DIAGNOSIS — I5042 Chronic combined systolic (congestive) and diastolic (congestive) heart failure: Secondary | ICD-10-CM

## 2021-08-17 DIAGNOSIS — I10 Essential (primary) hypertension: Secondary | ICD-10-CM

## 2021-08-17 DIAGNOSIS — N183 Chronic kidney disease, stage 3 unspecified: Secondary | ICD-10-CM

## 2021-08-17 NOTE — Patient Instructions (Addendum)
Visit Information  Following are the goals we discussed today:   Manage My Medications   Timeframe:  Long-Range Goal Priority:  High Start Date:  08/17/2021                           Expected End Date:  08/17/2022                     Follow Up Date 12/16/2021   - call if I am sick and can't take my medicine - keep a list of all the medicines I take; vitamins and herbals too - learn to read medicine labels - use a pillbox to sort medicine    Why is this important?   These steps will help you keep on track with your medicines.  Plan: Telephone follow up appointment with care management team member scheduled for:  4 months  The patient has been provided with contact information for the care management team and has been advised to call with any health related questions or concerns.   Tomasa Blase, PharmD Clinical Pharmacist, [facility]    Please call the care guide team at [phone] if you need to cancel or reschedule your appointment.   Patient verbalizes understanding of instructions provided today and agrees to view in [place].

## 2021-08-17 NOTE — Progress Notes (Signed)
Chronic Care Management Pharmacy Note  08/17/2021 Name:  Nicholas Burton MRN:  [id number] DOB:  03/31/1928  Summary: -Patient reports that he has been checking blood sugars twice daily - in AM averaging 130-140 and in PM averaging 200 - notes to occasional hypoglycemia overnight, corrected by glucose tablets  -Checking blood pressure 2-3 times weekly - averaging 130/70's, was unsure of average pulse  -LDL with last check was well controlled, denies any issues/ AE from current medications   Recommendations/Changes made from today's visit: -Advised for patient to continue monitoring BG 1-2 times daily, unfortunately does not qualify for CMG through medicare or tricare benefits as he is not injecting insulin 3 or more times daily  -Patient to continue monitoring blood pressure at leas 2 times weekly - to reach out should BP average >140/90 -Patient / family to reach out to pharmacy about getting medications bubble packed / getting a weekly pill box to help increase adherence   Plan: -F/u in 4 months - patient will confirm if he is actively following with palliative care anymore, reports he believes he had a nurse visit from palliative care last week, did not see updated note in Epic - if patient is actively following with palliative care CCM will be unable to continue following with patient, voiced understanding   Subjective: Nicholas Burton is an 87 y.o. year old male who is a primary patient of [name], MD.  The CCM team was consulted for assistance with disease management and care coordination needs.    Engaged with patient face to face for initial visit in response to provider referral for pharmacy case management and/or care coordination services.   Consent to Services:  The patient was given the following information about Chronic Care Management services today, agreed to services, and gave verbal consent: 1. CCM service includes personalized support from designated clinical staff  supervised by the primary care provider, including individualized plan of care and coordination with other care providers 2. 24/7 contact phone numbers for assistance for urgent and routine care needs. 3. Service will only be billed when office clinical staff spend 20 minutes or more in a month to coordinate care. 4. Only one practitioner may furnish and bill the service in a calendar month. 5.The patient may stop CCM services at any time (effective at the end of the month) by phone call to the office staff. 6. The patient will be responsible for cost sharing (co-pay) of up to 20% of the service fee (after annual deductible is met). Patient agreed to services and consent obtained.  Patient Care Team: [name], MD as PCP - General Irish Lack Charlann Lange, MD as PCP - Cardiology (Cardiology) Knox Royalty, RN as Case Manager Kecia Swoboda, Darnelle Maffucci, [facility] as Pharmacist (Pharmacist)  Recent office visits:  06/02/21 [name], MD-Internal Medicine (Upper respiratory tract infection, unspecified type) Orders: DG Chest 2 View, Med changes: Hydrocodone 5-1.35m/ml, levofloxacin 500 mg, prednisone 10 mg   04/22/21 [name] MD-PCP ([place], Diabetes) Labs ordered, med changes: discontinued aspirin EC, revefenacin, start on insulin glargine   Recent consult visits:  07/02/21 VJettie Booze MD-Cardiology (Chronic combined systolic and diastolic congestive heart failure) Orders: EKG, and no med changes   Hospital visits:  None in previous 6 months  Objective:  Lab Results  Component Value Date   CREATININE 1.52 (H) 04/22/2021   BUN 23 04/22/2021   GFR 39.86 (L) 04/22/2021   GFRNONAA 47 (L) 12/26/2018   GFRAA  55 (L) 12/26/2018   NA 140 04/22/2021   K 4.4 04/22/2021   CALCIUM 8.9 04/22/2021   CO2 25 04/22/2021   GLUCOSE 103 (H) 04/22/2021    Lab Results  Component Value Date/Time   HGBA1C 7.3 (H) 04/22/2021 12:17 PM   HGBA1C 7.1 (H) 12/21/2020 11:27 AM   GFR 39.86 (L) 04/22/2021  12:17 PM   GFR 35.69 (L) 12/21/2020 11:27 AM   MICROALBUR 0.7 09/17/2019 10:29 AM   MICROALBUR 0.8 10/25/2018 11:38 AM    Last diabetic Eye exam:  Lab Results  Component Value Date/Time   HMDIABEYEEXA Retinopathy (A) 09/05/2019 12:00 AM    Last diabetic Foot exam:  Lab Results  Component Value Date/Time   HMDIABFOOTEX normal 07/31/2012 12:00 AM     Lab Results  Component Value Date   CHOL 83 09/17/2019   HDL 36.40 (L) 09/17/2019   LDLCALC 19 09/17/2019   TRIG 136.0 09/17/2019   CHOLHDL 2 09/17/2019    Hepatic Function Latest Ref Rng & Units 09/17/2019 10/25/2018 09/03/2018  Total Protein 6.0 - 8.3 g/dL 5.6(L) 6.3 6.3  Albumin 3.5 - 5.2 g/dL 3.6 4.0 3.7  AST 0 - 37 U/L _0 ALT 0 - 53 U/L _1 Alk Phosphatase 39 - 117 U/L 113 104 97  Total Bilirubin 0.2 - 1.2 mg/dL 0.4 0.5 0.4  Bilirubin, Direct 0.0 - 0.3 mg/dL 0.1 - -    Lab Results  Component Value Date/Time   TSH 4.25 09/17/2019 10:28 AM   TSH 3.85 10/25/2018 11:38 AM    CBC Latest Ref Rng & Units 04/22/2021 12/21/2020 09/21/2020  WBC 4.0 - 10.5 K/uL 10.7(H) 8.7 10.1  Hemoglobin 13.0 - 17.0 g/dL 12.1(L) 11.8(L) 12.6(L)  Hematocrit 39.0 - 52.0 % 36.5(L) 35.9(L) 37.9(L)  Platelets 150.0 - 400.0 K/uL 224.0 194.0 250.0    Lab Results  Component Value Date/Time   VD25OH 30.37 10/25/2018 11:38 AM   VD25OH 32 01/07/2010 06:56 PM   VD25OH 29 (L) 09/10/2009 07:17 PM    Clinical ASCVD: No  The ASCVD Risk score (Arnett DK, et al., 2019) failed to calculate for the following reasons:   The 2019 ASCVD risk score is only valid for ages 33 to 26    Depression screen PHQ 2/9 07/09/2021 07/09/2021 04/22/2021  Decreased Interest 0 0 0  Down, Depressed, Hopeless 0 0 0  PHQ - 2 Score 0 0 0  Altered sleeping - - -  Tired, decreased energy - - -  Change in appetite - - -  Feeling bad or failure about yourself  - - -  Trouble concentrating - - -  Moving slowly or fidgety/restless - - -  Suicidal thoughts - - -   PHQ-9 Score - - -  Difficult doing work/chores - - -    Social History   Tobacco Use  Smoking Status Never  Smokeless Tobacco Never   BP Readings from Last 3 Encounters:  07/09/21 130/85  07/02/21 124/76  06/02/21 128/76   Pulse Readings from Last 3 Encounters:  07/09/21 86  07/02/21 87  06/02/21 92   Wt Readings from Last 3 Encounters:  07/09/21 196 lb (88.9 kg)  07/02/21 198 lb (89.8 kg)  06/02/21 196 lb 6.4 oz (89.1 kg)   BMI Readings from Last 3 Encounters:  07/09/21 28.94 kg/m  07/02/21 29.24 kg/m  06/02/21 29.00 kg/m    Assessment/Interventions: Review of patient past medical history, allergies, medications, health status, including review of consultants reports, laboratory and  other test data, was performed as part of comprehensive evaluation and provision of chronic care management services.   SDOH:  (Social Determinants of Health) assessments and interventions performed: Yes  SDOH Screenings   Alcohol Screen: Low Risk    Last Alcohol Screening Score (AUDIT): 0  Depression (PHQ2-9): Low Risk    PHQ-2 Score: 0  Financial Resource Strain: Low Risk    Difficulty of Paying Living Expenses: Not hard at all  Food Insecurity: No Food Insecurity   Worried About Charity fundraiser in the Last Year: Never true   Ran Out of Food in the Last Year: Never true  Housing: Low Risk    Last Housing Risk Score: 0  Physical Activity: Inactive   Days of Exercise per Week: 0 days   Minutes of Exercise per Session: 0 min  Social Connections: Moderately Isolated   Frequency of Communication with Friends and Family: Twice a week   Frequency of Social Gatherings with Friends and Family: Twice a week   Attends Religious Services: Never   Marine scientist or Organizations: No   Attends Music therapist: Never   Marital Status: Married  Stress: No Stress Concern Present   Feeling of Stress : Not at all  Tobacco Use: Low Risk    Smoking Tobacco Use:  Never   Smokeless Tobacco Use: Never   Passive Exposure: Not on file  Transportation Needs: No Transportation Needs   Lack of Transportation (Medical): No   Lack of Transportation (Non-Medical): No    CCM Care Plan  Allergies  Allergen Reactions   Ace Inhibitors     REACTION: cough   Oxycodone-Acetaminophen    Penicillins     Medications Reviewed Today     Reviewed by Tomasa Blase, [facility] (Pharmacist) on 08/17/21 at 1506  Med List Status: <None>   Medication Order Taking? Sig Documenting Provider Last Dose Status Informant  aspirin EC 81 MG tablet [id number] Yes Take 81 mg by mouth daily. Swallow whole. [provider] Taking Active   atorvastatin (LIPITOR) 10 MG tablet [id number] Yes Take 1 tablet (10 mg total) by mouth daily. [name], MD Taking Active   Blood Glucose Monitoring Suppl (FREESTYLE FREEDOM LITE) W/DEVICE KIT [id number] No Use to check blood sugars daily Dx E11.8  Patient not taking: Reported on 07/21/2021   [name], MD Not Taking Active   cholecalciferol (VITAMIN D) 1000 units tablet [id number] Yes Take 1,000 Units by mouth daily. [provider] Taking Active   Colesevelam HCl 3.75 g PACK [id number] Yes MIX THE CONTENTS OF 1 PACKET IN FLUID AND DRINK DAILY [name], MD Taking Active   Continuous Blood Gluc Receiver (FREESTYLE LIBRE 2 READER) [place] [id number] No 1 Act by Does not apply route daily.  Patient not taking: Reported on 07/21/2021   [name], MD Not Taking Active   Continuous Blood Gluc Sensor (FREESTYLE LIBRE 2 SENSOR) [place] [id number] No 1 Act by Does not apply route daily.  Patient not taking: Reported on 07/21/2021   [name], MD Not Taking Active   Dietary Management Product [facility]) TABS [id number] Yes Take 1 tablet by mouth daily. [name], MD Taking Active   FREESTYLE LITE test strip [id number] Yes USE AS INSTRUCTED TO CHECK BLOOD SUGAR TWICE A DAY [name], MD Taking Active    Glucagon (GVOKE HYPOPEN 2-PACK) 1 MG/0.2ML SOAJ [id number]  Inject 1 Act into the skin daily as needed. Scarlette Calico  L, MD  Active   Insulin Pen Needle (B-D ULTRAFINE III [place] PEN) 31G X 8 MM MISC [id number] Yes USE TO INJECT INSULIN DAILY [name], MD Taking Active   Lancets (FREESTYLE) lancets [id number] Yes Use to check blood sugars twice day Dx E11.8 [name], MD Taking Active   LANTUS SOLOSTAR 100 UNIT/ML Solostar Pen [id number] Yes INJECT 0.3ML (30 UNITS TOTAL) INTO THE SKIN AT BEDTIME [name], MD Taking Active   losartan (COZAAR) 25 MG tablet [id number] Yes Take 1 tablet (25 mg total) by mouth daily. [name], MD Taking Active   metFORMIN (GLUCOPHAGE) 500 MG tablet [id number] Yes TAKE 1 TABLET TWICE A DAY WITH MEALS [name], MD Taking Active   metoprolol tartrate (LOPRESSOR) 25 MG tablet [id number] Yes TAKE ONE-HALF (1/2) TABLET BY MOUTH TWICE A DAY [name], MD Taking Active   Multiple Vitamins-Minerals (PRESERVISION/LUTEIN) CAPS [id number] Yes Take 1 capsule by mouth 2 (two) times daily. [provider] Taking Active   torsemide (DEMADEX) 10 MG tablet [id number] Yes Take 1 tablet (10 mg total) by mouth daily. [name], MD Taking Active   Vibegron [facility]) 75 MG TABS [id number] Yes Take 1 tablet by mouth daily. [name], MD Taking Active   vitamin B-12 (CYANOCOBALAMIN) 500 MCG tablet [id number] Yes Take 500 mcg by mouth daily. [provider] Taking Active             Patient Active Problem List   Diagnosis Date Noted   URI (upper respiratory infection) 06/02/2021   Hearing loss secondary to cerumen impaction, left 09/21/2020   Late onset Alzheimer's dementia without behavioral disturbance ([place]) 09/21/2020   Routine general medical examination at a health care facility 09/17/2019   Iron deficiency anemia secondary to inadequate dietary iron intake 06/06/2019   Moderate persistent asthma with acute  exacerbation 05/16/2019   Chronic combined systolic and diastolic congestive heart failure ([place]) 10/30/2018   PSA elevation 10/25/2018   CRI (chronic renal insufficiency), stage 3 (moderate) 10/25/2018   Venous stasis dermatitis of both lower extremities 01/29/2018   COPD with asthma ([place]) 03/02/2017   H/O melanoma excision 12/02/2013   Obesity (BMI 30.0-34.9) 04/01/2013   BPH (benign prostatic hyperplasia) 09/08/2011   B12 deficiency 08/13/2010   Type II diabetes mellitus with manifestations ([place]) 11/10/2008   Vitamin D deficiency 11/10/2008   Hyperlipidemia with target LDL less than 100 11/10/2008   Essential hypertension 11/10/2008   Osteoarthritis 11/10/2008    Immunization History  Administered Date(s) Administered   Fluad Quad(high Dose 65+) 05/16/2019   Influenza Split 05/24/2012   Influenza Whole 08/30/2007, 05/12/2010   Influenza, High Dose Seasonal PF 04/28/2015, 05/03/2016, 05/01/2018   Influenza,inj,Quad PF,6+ Mos 06/03/2013, 05/20/2014, 06/30/2017   Influenza-Unspecified 05/29/2021   Moderna Sars-Covid-2 Vaccination 10/12/2019, 11/10/2019, 07/01/2020   Pneumococcal Conjugate-13 06/12/2014   Pneumococcal Polysaccharide-23 08/29/2006, 04/01/2013, 09/17/2019   Td 08/29/2002   Tdap 04/01/2013, 09/21/2020   Varicella 07/21/2008   Zoster Recombinat (Shingrix) 05/17/2018, 07/16/2018   Zoster, Live 07/16/2008    Conditions to be addressed/monitored:  Hypertension, Hyperlipidemia, Diabetes, Heart Failure, and Chronic Kidney Disease  Care Plan : [place]  Updates made by Tomasa Blase, RPH since 08/17/2021 12:00 AM     Problem: Hypertension, Hyperlipidemia, Diabetes, Heart Failure, and Chronic Kidney Disease   Priority: High  Onset Date: 08/17/2021     Long-Range Goal: Disease Management   Start Date: 08/17/2021  Expected End Date: 08/17/2022  This Visit's  Progress: On track  Priority: High  Note:   Hyperlipidemia: (LDL goal <  100) -Controlled Lab Results  Component Value Date   LDLCALC 19 09/17/2019  -Current treatment: Atorvastatin 32m - 1 tablet daily  ASA 832m- 1 tablet daily  -Medications previously tried: pitavastatin, atorvastatin 4080m -Current dietary patterns: notes to trying to eat low cholesterol diet, but does admit to eating fried foods on occasion  -Current exercise habits: tries to remain active at home doing yard work during the spring and summer time, stay active with leg exercises that he does twice daily  -Educated on Cholesterol goals;  Benefits of statin for ASCVD risk reduction; Importance of limiting foods high in cholesterol; Exercise goal of 150 minutes per week; -Counseled on diet and exercise extensively Recommended to continue current medication  Diabetes (A1c goal <7%) / CKD (prevention of disease progression) -Not ideally controlled Lab Results  Component Value Date   HGBA1C 7.3 (H) 04/22/2021  -last eGFR: 39.86 mL/min -Current medications: Lantus 30 units nightly  Metformin 500m43m1 tablet twice daily  GVOKE hypopen - inject as needed for hypoglycemia  -Medications previously tried: kombiglyze,   -Current home glucose readings fasting glucose: 130-140 in the AM  post prandial glucose: 200 in the PM -Reports hypoglycemic/hyperglycemic symptoms waking up with low blood sugar periodically - corrected by glucose tablets  -Current meal patterns:  breakfast: Cheerios, strawberries, banana, blueberry, milk, cup of coffee   lunch: does not typically eat lunch, if he does is something small - leftover in the fridge   dinner: Porkchops, chicken, seafood snacks: cheetos drinks: water, diet soda  -Current exercise: reports to a light leg stretching exercise that he does twice daily- mows grass on riding mower in the summer - noted that patient ambulates with a cane  -Educated on A1c and blood sugar goals; Complications of diabetes including kidney damage, retinal damage, and  cardiovascular disease; Exercise goal of 150 minutes per week; Proper insulin injection technique; Prevention and management of hypoglycemic episodes; Benefits of routine self-monitoring of blood sugar; Carbohydrate counting and/or plate method -Counseled to check feet daily and get yearly eye exams -Counseled on diet and exercise extensively Recommended to continue current medication  Heart Failure (Goal: manage symptoms and prevent exacerbations) / Hypertension (BP goal <140/90) -Controlled -Last ejection fraction: 60-65% (Date: 04/01/2019) -HF type: Combined Systolic and Diastolic -NYHA Class: not defined in patient chart  -AHA HF Stage: not defined in patient chart  -Current treatment: Losartan 25mg50m tablet daily  Torsemide 10mg 53mtablet daily as needed  Metoprolol Tartrate 25mg -22m tablet twice daily  -Medications previously tried: carvedilol, hctz, telmisartan,   -Current home BP/HR readings: ~130/70's  BP Readings from Last 3 Encounters:  07/09/21 130/85  07/02/21 124/76  06/02/21 128/76  -Current dietary habits: reports he adds salt to food - but reports that it is not a large amount  -Current exercise habits: ries to remain active at home doing yard work during the spring and summer time, stay active with leg exercises that he does twice daily  -Educated on Benefits of medications for managing symptoms and prolonging life Importance of weighing daily; if you gain more than 3 pounds in one day or 5 pounds in one week, reach out to office  Proper diuretic administration and potassium supplementation Importance of blood pressure control -Counseled on diet and exercise extensively Recommended to continue current medication  BPH (Goal: control of symptoms) -Controlled -Current treatment  Gemtesa 75mg -58m  1 tablet daily  -Medications previously tried: tamsulosin, alfuzosin  -Recommended to continue current medication  Health Maintenance -Vaccine gaps: due for COVID  booster  -Current therapy:  Vitamin b12 1023mg - 1 tablet daily  Vitamin D 1000 units - 1 tablet daily  Preservision/ Lutein - 1 capsule daily  Vasculera - 1 tablet daily  Colsevelam 3.75g packet - 1 packet daily - BM well controlled  -Educated on Cost vs benefit of each product must be carefully weighed by individual consumer -Patient is satisfied with current therapy and denies issues -Counseled on diet and exercise extensively Recommended to continue current medication  Patient Goals/Self-Care Activities Patient will:  - take medications as prescribed as evidenced by patient report and record review check glucose 1-2 times daily, document, and provide at future appointments check blood pressure at least twice weekly, document, and provide at future appointments target a minimum of 150 minutes of moderate intensity exercise weekly engage in dietary modifications by reducing carb intake / reducing fried and salty foods   Follow Up Plan: Telephone follow up appointment with care management team member scheduled for: 4 months  The patient has been provided with contact information for the care management team and has been advised to call with any health related questions or concerns.        Medication Assistance: None required.  Patient affirms current coverage meets needs.  Compliance/Adherence/Medication fill history: Care Gaps: Ophthalmology exam, foot exam, influenza vaccine, and COVID vaccine booster   [facility]  [facility] [address]Phone: [phone]: [phone] [facility] [address]Phone: [phone]: [phone] [facility] [address] [address]Phone: [phone]:  [phone] [address] [address]Phone: [phone]: [phone]  Uses pill box? No - using bottles currently, does feel that using pill box / having meds blister packed would make adherence easier Pt endorses 90-95% compliance  Care Plan and Follow Up Patient Decision:  Patient agrees to Care Plan and Follow-up.  Plan: Telephone follow up appointment with care management team member scheduled for:  4 months  The patient has been provided with contact information for the care management team and has been advised to call with any health related questions or concerns.   DTomasa Blase PharmD Clinical Pharmacist, [place]

## 2021-08-28 DIAGNOSIS — E118 Type 2 diabetes mellitus with unspecified complications: Secondary | ICD-10-CM

## 2021-08-28 DIAGNOSIS — E119 Type 2 diabetes mellitus without complications: Secondary | ICD-10-CM | POA: Diagnosis not present

## 2021-08-28 DIAGNOSIS — N183 Chronic kidney disease, stage 3 unspecified: Secondary | ICD-10-CM

## 2021-08-28 DIAGNOSIS — I5042 Chronic combined systolic (congestive) and diastolic (congestive) heart failure: Secondary | ICD-10-CM | POA: Diagnosis not present

## 2021-08-28 DIAGNOSIS — Z794 Long term (current) use of insulin: Secondary | ICD-10-CM | POA: Diagnosis not present

## 2021-08-28 DIAGNOSIS — I110 Hypertensive heart disease with heart failure: Secondary | ICD-10-CM | POA: Diagnosis not present

## 2021-09-09 ENCOUNTER — Ambulatory Visit (INDEPENDENT_AMBULATORY_CARE_PROVIDER_SITE_OTHER): Payer: Medicare Other | Admitting: *Deleted

## 2021-09-09 DIAGNOSIS — I5042 Chronic combined systolic (congestive) and diastolic (congestive) heart failure: Secondary | ICD-10-CM

## 2021-09-09 DIAGNOSIS — E118 Type 2 diabetes mellitus with unspecified complications: Secondary | ICD-10-CM

## 2021-09-09 NOTE — Patient Instructions (Signed)
Visit Information  Nicholas Burton, thank you for taking time to talk with me today. Please don't hesitate to contact me if I can be of assistance to you before our next scheduled telephone appointment.  Below are the goals we discussed today:  Patient Self-Care Activities: Patient Nicholas Burton will: Take medications as prescribed Attend all scheduled provider appointments Call pharmacy for medication refills Call provider office for new concerns or questions Continue checking fasting (first thing in the morning, before eating) blood sugars, and then after-eating blood sugars, 2 hours after a regular meal Write down on paper your blood sugars so we can review them together the next time we talk Consider checking your weights at home every day and writing them down on paper or your calendar, several times a week: overnight weight gain of 3 or more pounds is the earliest indicator that you may be retaining fluid Try to take steps to better follow heart healthy, low salt, low cholesterol, carbohydrate-modified, low sugar diet  Our next scheduled telephone follow up visit/ appointment with care management team member is scheduled on:  Wednesday, October 27, 2021 at 3:00 pm  If you need to cancel or re-schedule our visit, please call [phone] and our care guide team will be happy to assist you.   I look forward to hearing about your progress.   Nicholas Rack, RN, BSN, [place] [phone]: direct office  If you are experiencing a Mental Health or [place] or need someone to talk to, please  call the Suicide and Crisis Lifeline: 988 call the [place] National Suicide Prevention Lifeline: 218-228-9525 or TTY: (610)041-8474 TTY 616 408 7031) to talk to a trained counselor call 1-800-273-TALK (toll free, 24 hour hotline) go to [facility] [address], [place] [phone]) call 911   The  patient verbalized understanding of instructions, educational materials, and care plan provided today and agreed to receive a mailed copy of patient instructions, educational materials, and care plan  Living With Heart Failure Heart failure is a long-term (chronic) condition in which the heart cannot pump enough blood through the body. When this happens, parts of the body do not get the blood and oxygen they need. There is no cure for heart failure at this time, so it is important for you to take good care of yourself and follow the treatment plan you set with your health care provider. If you are living with heart failure, there are ways to help you manage the disease. How to manage lifestyle changes Living with heart failure requires you to make changes in your life. Your health care team will teach you about the changes you need to make in order to relieve your symptoms and lower your risk of going to the hospital. Work with your health care provider to develop a treatment plan that works for you. Activity Ask your health care provider about attending cardiac rehabilitation. These programs include aerobic physical activity, which provides many benefits for your heart. If no cardiac rehabilitation program is available, ask your health care provider what aerobic exercises are safe for you to do. Return to your normal activities as told by your health care provider. Ask your health care provider what activities are safe for you. Pace your daily activities and allow time for rest as needed. Managing stress It is normal to have many emotions about your diagnosis, such as fear, sadness, anger, and loss. If you feel any of these  emotions and need help coping, contact your health care provider. Here are some ways to help yourself manage these emotions: Talk to friends and family members about your condition. They can give you support and guidance. Explain your symptoms to them and, if comfortable, invite  them to attend appointments or rehabilitation with you. Join a support group for people with chronic heart failure. Talking with other people who have the same symptoms may give you new ways of coping with your disease and your emotions. Accept help from others. Do not be ashamed if you need help with certain tasks. Use stress management techniques, such as meditation, breathing exercises, or listening to relaxing music. Conditions such as depression and anxiety are common in persons with heart failure. Pay attention to changes in your mood, emotions, and stress levels. Tell your health care provider if you have any of the following symptoms: Trouble sleeping or a change in your sleeping patterns. Feeling sad, down, or depressed more often than not, every day for more than 2 weeks. Losing interest in activities you normally enjoy. Feeling irritable or crying for no reason. Finding yourself worrying about the future often. Work You may need to develop a plan with your health care provider if heart failure interferes with your ability to work. This may include: Reducing your work hours. Finding functions that are less active or require less effort. Planning rest periods during your work hours. Travel Talk with your health care provider if you plan to travel. There may be circumstances in which your health care provider recommends that you do not travel or that you delay travel until your condition is under control. When you travel, bring your medicine and a list of your medicines. If you are traveling by air, keep your medicines with you in a carry-on bag. Consider finding a medical facility in the area you will be traveling to and determine what your health insurance will cover. If you will be traveling by public transportation (airplane, train, bus), contact the company prior to traveling if you have special needs. This may include needs related to diet, oxygen, a wheelchair, a seating request, or  help with luggage. If you use oxygen, make sure to bring enough oxygen with you. If you have a battery-powered device, bring a fully charged extra battery with you. If you have a device, bring a note from your health care provider and inform all security screening personnel that you have the device. You may need to go through special screening for safety. Sexual activity  Ask your health care provider when it is safe for you to resume sexual activity. You may need to start slowly and gradually increase intimacy. You can increase intimacy by doing such things as caressing, touching, and holding each other. Get regular exercise as told by your health care provider. This can benefit your sex life by building strength and endurance. Sleep If your condition interferes with your sleep, find ways to improve your sleep quality, such as: Sleep lying on your side, or sleep with your head elevated by raising the head of your bed or using multiple pillows. Ask your health care provider about screening for sleep apnea. Try to go to sleep and wake up at the same times every day. Sleep in a dark, cool room. Do not do any physical activity or eating for a few hours before bedtime. Plan rest periods during the day, but do not take long naps during the day.  Where to find support Consider talking  with: Family members. Close friends. A mental health professional or therapist. A member of your church, faith, or community group. Other sources of support include: Local support groups. Ask your health care provider about groups near you. Online support groups, such as those found through the American Heart Association: supportnetwork.heart.org Local home care agencies, community agencies, or social agencies. A palliative care specialist. Palliative care can help you manage symptoms, promote comfort, improve quality of life, and maintain dignity. Where to find more information American Heart Association:  heart.org National Heart, Lung, and Blood Institute: https://www.hartman-hill.biz/ Centers for Disease Control and Prevention: https://www.reeves.com/ [place]: SolutionApps.it Contact a health care provider if: You have a rapid weight gain. You have increasing shortness of breath that is unusual for you. You are unable to participate in your usual physical activities. You tire easily. You have difficulty sleeping, such as: You wake up feeling [place] of breath. You have to use more pillows to raise your head in order to sleep. You cough more than normal, especially with physical activity. You have any swelling or more swelling in areas such as your hands, feet, ankles, or abdomen. You become dizzy or light-headed when you stand up. You have changes to your appetite. You have symptoms of depression or anxiety. Get help right away if: You have difficulty breathing. You notice or your family notices a change in your awareness, such as having trouble staying awake or having difficulty with concentration. You have pain or discomfort in your chest. You have an episode of fainting (syncope). You feel like your heart is beating quickly (palpitations). You have extreme feelings of sadness or loss of hope, or you have thoughts about hurting yourself or others. These symptoms may represent a serious problem that is an emergency. Do not wait to see if the symptoms will go away. Get medical help right away. Call your local emergency services (911 in the [place] Do not drive yourself to the hospital. Summary There is no cure for heart failure, so it is important for you to take good care of yourself and follow the treatment plan set by your health care provider. Ask your health care provider about attending cardiac rehabilitation. These programs include aerobic physical activity, which provides many benefits for your heart. It is normal to have many emotions about your diagnosis, such as fear,  sadness, anger, and loss. If you feel any of these emotions and need help coping, contact your health care provider. You may need to develop a plan with your health care provider if heart failure interferes with your ability to work. This information is not intended to replace advice given to you by your health care provider. Make sure you discuss any questions you have with your health care provider. Document Revised: 03/30/2020 Document Reviewed: 03/30/2020 Elsevier Patient Education  2022 [employer].

## 2021-09-09 NOTE — Chronic Care Management (AMB) (Signed)
Chronic Care Management   CCM RN Visit Note  09/09/2021 Name: Nicholas Burton MRN: [id number] DOB: September 28, 1927  Subjective: Nicholas Burton is a 88 y.o. year old male who is a primary care patient of [name], MD. The care management team was consulted for assistance with disease management and care coordination needs.    Engaged with patient by telephone for follow up visit in response to provider referral for case management and/or care coordination services.   Consent to Services:  The patient was given information about Chronic Care Management services, agreed to services, and gave verbal consent prior to initiation of services.  Please see initial visit note for detailed documentation.  Patient agreed to services and verbal consent obtained.   Assessment: Review of patient past medical history, allergies, medications, health status, including review of consultants reports, laboratory and other test data, was performed as part of comprehensive evaluation and provision of chronic care management services.   SDOH (Social Determinants of Health) assessments and interventions performed:  SDOH Interventions    Flowsheet Row Most Recent Value  SDOH Interventions   Food Insecurity Interventions Intervention Not Indicated  [continues to deny food insecurity]  Transportation Interventions Intervention Not Indicated  [family continues to provide transportation]       CCM Care Plan Allergies  Allergen Reactions   Ace Inhibitors     REACTION: cough   Oxycodone-Acetaminophen    Penicillins    Outpatient Encounter Medications as of 09/09/2021  Medication Sig   aspirin EC 81 MG tablet Take 81 mg by mouth daily. Swallow whole.   atorvastatin (LIPITOR) 10 MG tablet Take 1 tablet (10 mg total) by mouth daily.   Blood Glucose Monitoring Suppl (FREESTYLE FREEDOM LITE) W/DEVICE KIT Use to check blood sugars daily Dx E11.8 (Patient not taking: Reported on 07/21/2021)   cholecalciferol (VITAMIN  D) 1000 units tablet Take 1,000 Units by mouth daily.   Colesevelam HCl 3.75 g PACK MIX THE CONTENTS OF 1 PACKET IN FLUID AND DRINK DAILY   Continuous Blood Gluc Receiver (FREESTYLE LIBRE 2 READER) DEVI 1 Act by Does not apply route daily. (Patient not taking: Reported on 07/21/2021)   Continuous Blood Gluc Sensor (FREESTYLE LIBRE 2 SENSOR) MISC 1 Act by Does not apply route daily. (Patient not taking: Reported on 07/21/2021)   Dietary Management Product (VASCULERA) TABS Take 1 tablet by mouth daily.   FREESTYLE LITE test strip USE AS INSTRUCTED TO CHECK BLOOD SUGAR TWICE A DAY   Glucagon (GVOKE HYPOPEN 2-PACK) 1 MG/0.2ML SOAJ Inject 1 Act into the skin daily as needed.   Insulin Pen Needle (B-D ULTRAFINE III [place] PEN) 31G X 8 MM MISC USE TO INJECT INSULIN DAILY   Lancets (FREESTYLE) lancets Use to check blood sugars twice day Dx E11.8   LANTUS SOLOSTAR 100 UNIT/ML Solostar Pen INJECT 0.3ML (30 UNITS TOTAL) INTO THE SKIN AT BEDTIME   losartan (COZAAR) 25 MG tablet Take 1 tablet (25 mg total) by mouth daily.   metFORMIN (GLUCOPHAGE) 500 MG tablet TAKE 1 TABLET TWICE A DAY WITH MEALS   metoprolol tartrate (LOPRESSOR) 25 MG tablet TAKE ONE-HALF (1/2) TABLET BY MOUTH TWICE A DAY   Multiple Vitamins-Minerals (PRESERVISION/LUTEIN) CAPS Take 1 capsule by mouth 2 (two) times daily.   torsemide (DEMADEX) 10 MG tablet Take 1 tablet (10 mg total) by mouth daily.   Vibegron (GEMTESA) 75 MG TABS Take 1 tablet by mouth daily.   vitamin B-12 (CYANOCOBALAMIN) 500 MCG tablet Take 500 mcg by mouth  daily.   No facility-administered encounter medications on file as of 09/09/2021.   Patient Active Problem List   Diagnosis Date Noted   URI (upper respiratory infection) 06/02/2021   Hearing loss secondary to cerumen impaction, left 09/21/2020   Late onset Alzheimer's dementia without behavioral disturbance ([place]) 09/21/2020   Routine general medical examination at a health care facility 09/17/2019   Iron  deficiency anemia secondary to inadequate dietary iron intake 06/06/2019   Moderate persistent asthma with acute exacerbation 05/16/2019   Chronic combined systolic and diastolic congestive heart failure ([place]) 10/30/2018   PSA elevation 10/25/2018   CRI (chronic renal insufficiency), stage 3 (moderate) 10/25/2018   Venous stasis dermatitis of both lower extremities 01/29/2018   COPD with asthma ([place]) 03/02/2017   H/O melanoma excision 12/02/2013   Obesity (BMI 30.0-34.9) 04/01/2013   BPH (benign prostatic hyperplasia) 09/08/2011   B12 deficiency 08/13/2010   Type II diabetes mellitus with manifestations ([place]) 11/10/2008   Vitamin D deficiency 11/10/2008   Hyperlipidemia with target LDL less than 100 11/10/2008   Essential hypertension 11/10/2008   Osteoarthritis 11/10/2008   Conditions to be addressed/monitored:  CHF and DMII  Care Plan : RN Care Manager Plan of Care  Updates made by Knox Royalty, RN since 09/09/2021 12:00 AM     Problem: Chronic Disease Management Needs   Priority: High     Long-Range Goal: Development of plan of care for long term chronic disease management   Start Date: 07/21/2021  Expected End Date: 07/21/2022  Priority: High  Note:   Current Barriers:  Chronic Disease Management support and education needs related to CHF and DMII Hard of hearing: bilateral hearing aids Vision concerns: macular degeneration; gets regular retinal injections  RNCM Clinical Goal(s):  Patient will demonstrate ongoing health management independence as evidenced by adherence to plan of care for DMII, CHF        through collaboration with RN Care manager, provider, and care team.   Interventions: 1:1 collaboration with primary care provider regarding development and update of comprehensive plan of care as evidenced by provider attestation and co-signature Inter-disciplinary care team collaboration (see longitudinal plan of care) Evaluation of current treatment plan related  to  self management and patient's adherence to plan as established by provider  Heart Failure Interventions:  (Status: Goal on Track (progressing): YES.)  Long Term Goal  Basic overview and discussion of pathophysiology of Heart Failure reviewed Advised patient to weigh each morning after emptying bladder Discussed importance of daily weight and advised patient to weigh and record daily Reviewed role of diuretics in prevention of fluid overload and management of heart failure Discussed the importance of keeping all appointments with provider Provided patient with education about the role of exercise in the management of heart failure Assessed social determinant of health barriers Patient reports has occasionally started to monitor/ record daily weights at home: reports weighing "once or twice a week;" reinforced previously provided education ideally around monitoring weight daily, patient states "it doesn't change that much;" and reports he believes weekly weight monitoring is sufficient; reinforced education around early vs. late signs/ symptoms of fluid retention; he reports that he rarely has lower extremity swelling; confirms takes diuretic as prescribed Reinforced previously provided education around signs/ symptoms yellow CHF zone along with corresponding action plan, patient reports today, no concerns around swelling in feet/ ankles/ abdomen Reviewed recent weights at home: reports weights consistently remain "between 195-196 lbs;" this is consistent with previously reported weights Confirmed patient continues periodically  monitoring blood pressures at home: is not recording and has not specific values to review; states "they are all in normal ranges and are not high" Confirms not consistently following heart healthy, diabetes diet; "we try to limit salt"-- reinforced previously provided education around importance of adherence to prescribed diet Denies concerns around medications today- wife  continues to assist in medication preparation/ administration due to patient's limited (baseline) vision  Diabetes:  (Status: Goal on Track (progressing): YES.) Long Term Goal   Lab Results  Component Value Date   HGBA1C 7.3 (H) 04/22/2021  Reviewed prescribed diet with patient heart healthy, low sodium, carbohydrate modified, low sugar; Counseled on importance of regular laboratory monitoring as prescribed;        Discussed plans with patient for ongoing care management follow up and provided patient with direct contact information for care management team;      Reviewed scheduled/upcoming provider appointments including: 10/12/21- PCP;         Review of patient status, including review of consultants reports, relevant laboratory and other test results, and medications completed;       Reviewed recent blood sugars with patient: he has started monitoring more consistently at appropriate times, per our previous discussion: reports consistent fasting blood sugars between "120-130's" and post-prandial values "between 190-200's;" patient denies recent low blood sugars at home Confirmed continues taking long acting insulin at 30 UqHS Patient continues to admit he is not very adherent to following prescribed diet: reinforced previously provided education/ discussed benefit of making small changes to diet for lasting change  Patient Goals/Self-Care Activities: As evidenced by review of EHR, collaboration with care team, and patient reporting during CCM RN CM outreach,  Patient Rangel will: Take medications as prescribed Attend all scheduled provider appointments Call pharmacy for medication refills Call provider office for new concerns or questions Continue checking fasting (first thing in the morning, before eating) blood sugars, and then after-eating blood sugars, 2 hours after a regular meal Write down on paper your blood sugars so we can review them together the next time we talk Consider checking  your weights at home every day and writing them down on paper or your calendar, several times a week: overnight weight gain of 3 or more pounds is the earliest indicator that you may be retaining fluid: the weight you reported today is "196 lbs" Try to take steps to better follow heart healthy, low salt, low cholesterol, carbohydrate-modified, low sugar diet    Plan: Telephone follow up appointment with care management team member scheduled for:  Wednesday, October 27, 2021 at 3:00 pm The patient has been provided with contact information for the care management team and has been advised to call with any health related questions or concerns  [name], RN, BSN, [place] [phone]: direct office

## 2021-09-21 ENCOUNTER — Telehealth: Payer: Self-pay | Admitting: *Deleted

## 2021-09-21 NOTE — Telephone Encounter (Signed)
Dr Irish Lack would like to refer patient to EMT paramedicine program to help with medication management.

## 2021-09-21 NOTE — Telephone Encounter (Signed)
Reviewed with patient's daughter in law [name]) and she will check with patient to see if he would like Home health referral.

## 2021-09-21 NOTE — Telephone Encounter (Signed)
Message from [place], [place]- Unfortunately, Paramedicine is only an option for patients seen in the HF clinics at [facility] and [facility]. I can forward the request to the chronic care management and if possible you could place a home health order for "disease management for HF, HTN and COPD" This will have a RN home visit to assist with getting his meds on track and any other needs identified in the home

## 2021-09-28 DIAGNOSIS — E118 Type 2 diabetes mellitus with unspecified complications: Secondary | ICD-10-CM

## 2021-09-28 DIAGNOSIS — I5042 Chronic combined systolic (congestive) and diastolic (congestive) heart failure: Secondary | ICD-10-CM | POA: Diagnosis not present

## 2021-10-06 DIAGNOSIS — H353211 Exudative age-related macular degeneration, right eye, with active choroidal neovascularization: Secondary | ICD-10-CM | POA: Diagnosis not present

## 2021-10-06 DIAGNOSIS — H353223 Exudative age-related macular degeneration, left eye, with inactive scar: Secondary | ICD-10-CM | POA: Diagnosis not present

## 2021-10-06 DIAGNOSIS — H43813 Vitreous degeneration, bilateral: Secondary | ICD-10-CM | POA: Diagnosis not present

## 2021-10-06 DIAGNOSIS — H3540 Unspecified peripheral retinal degeneration: Secondary | ICD-10-CM | POA: Diagnosis not present

## 2021-10-12 ENCOUNTER — Encounter: Payer: Medicare Other | Admitting: Internal Medicine

## 2021-10-26 ENCOUNTER — Ambulatory Visit: Payer: Medicare Other | Admitting: Internal Medicine

## 2021-10-26 ENCOUNTER — Ambulatory Visit (INDEPENDENT_AMBULATORY_CARE_PROVIDER_SITE_OTHER): Payer: Medicare Other | Admitting: Internal Medicine

## 2021-10-26 ENCOUNTER — Encounter: Payer: Self-pay | Admitting: Internal Medicine

## 2021-10-26 ENCOUNTER — Other Ambulatory Visit: Payer: Self-pay

## 2021-10-26 VITALS — BP 140/78 | HR 91 | Temp 98.1°F | Ht 69.0 in | Wt 195.0 lb

## 2021-10-26 DIAGNOSIS — E119 Type 2 diabetes mellitus without complications: Secondary | ICD-10-CM

## 2021-10-26 DIAGNOSIS — E118 Type 2 diabetes mellitus with unspecified complications: Secondary | ICD-10-CM

## 2021-10-26 DIAGNOSIS — N183 Chronic kidney disease, stage 3 unspecified: Secondary | ICD-10-CM

## 2021-10-26 DIAGNOSIS — E538 Deficiency of other specified B group vitamins: Secondary | ICD-10-CM

## 2021-10-26 DIAGNOSIS — Z794 Long term (current) use of insulin: Secondary | ICD-10-CM | POA: Diagnosis not present

## 2021-10-26 DIAGNOSIS — D508 Other iron deficiency anemias: Secondary | ICD-10-CM

## 2021-10-26 DIAGNOSIS — N3281 Overactive bladder: Secondary | ICD-10-CM | POA: Diagnosis not present

## 2021-10-26 DIAGNOSIS — I10 Essential (primary) hypertension: Secondary | ICD-10-CM | POA: Diagnosis not present

## 2021-10-26 DIAGNOSIS — E785 Hyperlipidemia, unspecified: Secondary | ICD-10-CM | POA: Diagnosis not present

## 2021-10-26 LAB — URINALYSIS, ROUTINE W REFLEX MICROSCOPIC
Bilirubin Urine: NEGATIVE
Hgb urine dipstick: NEGATIVE
Ketones, ur: NEGATIVE
Leukocytes,Ua: NEGATIVE
Nitrite: NEGATIVE
RBC / HPF: NONE SEEN (ref 0–?)
Specific Gravity, Urine: 1.02 (ref 1.000–1.030)
Urine Glucose: NEGATIVE
Urobilinogen, UA: 0.2 (ref 0.0–1.0)
pH: 5.5 (ref 5.0–8.0)

## 2021-10-26 LAB — BASIC METABOLIC PANEL
BUN: 25 mg/dL — ABNORMAL HIGH (ref 6–23)
CO2: 29 mEq/L (ref 19–32)
Calcium: 8.9 mg/dL (ref 8.4–10.5)
Chloride: 104 mEq/L (ref 96–112)
Creatinine, Ser: 1.58 mg/dL — ABNORMAL HIGH (ref 0.40–1.50)
GFR: 37.92 mL/min — ABNORMAL LOW (ref >60.00)
Glucose, Bld: 95 mg/dL (ref 70–99)
Potassium: 4.1 mEq/L (ref 3.5–5.1)
Sodium: 140 mEq/L (ref 135–145)

## 2021-10-26 LAB — CBC WITH DIFFERENTIAL/PLATELET
Basophils Absolute: 0 10*3/uL (ref 0.0–0.1)
Basophils Relative: 0.5 % (ref 0.0–3.0)
Eosinophils Absolute: 0.5 10*3/uL (ref 0.0–0.7)
Eosinophils Relative: 4.8 % (ref 0.0–5.0)
HCT: 37.6 % — ABNORMAL LOW (ref 39.0–52.0)
Hemoglobin: 12.4 g/dL — ABNORMAL LOW (ref 13.0–17.0)
Lymphocytes Relative: 31.4 % (ref 12.0–46.0)
Lymphs Abs: 3.1 10*3/uL (ref 0.7–4.0)
MCHC: 32.9 g/dL (ref 30.0–36.0)
MCV: 87.1 fl (ref 78.0–100.0)
Monocytes Absolute: 1.1 10*3/uL — ABNORMAL HIGH (ref 0.1–1.0)
Monocytes Relative: 10.8 % (ref 3.0–12.0)
Neutro Abs: 5.1 10*3/uL (ref 1.4–7.7)
Neutrophils Relative %: 52.5 % (ref 43.0–77.0)
Platelets: 219 10*3/uL (ref 150.0–400.0)
RBC: 4.32 Mil/uL (ref 4.22–5.81)
RDW: 14.4 % (ref 11.5–15.5)
WBC: 9.8 10*3/uL (ref 4.0–10.5)

## 2021-10-26 LAB — IBC + FERRITIN
Ferritin: 25.6 ng/mL (ref 22.0–322.0)
Iron: 65 ug/dL (ref 42–165)
Saturation Ratios: 19.9 % — ABNORMAL LOW (ref 20.0–50.0)
TIBC: 326.2 ug/dL (ref 250.0–450.0)
Transferrin: 233 mg/dL (ref 212.0–360.0)

## 2021-10-26 LAB — FOLATE: Folate: 12 ng/mL (ref >5.9)

## 2021-10-26 LAB — HEMOGLOBIN A1C: Hgb A1c MFr Bld: 7.7 % — ABNORMAL HIGH (ref 4.6–6.5)

## 2021-10-26 LAB — VITAMIN B12: Vitamin B-12: 317 pg/mL (ref 211–911)

## 2021-10-26 MED ORDER — GEMTESA 75 MG PO TABS: 1.0000 | ORAL_TABLET | Freq: Every day | ORAL | 1 refills | Status: DC

## 2021-10-26 MED ORDER — COLESEVELAM HCL 3.75 G PO PACK: 1.0000 | PACK | Freq: Every day | ORAL | 1 refills | Status: DC

## 2021-10-26 MED ORDER — METFORMIN HCL 500 MG PO TABS: 500.0000 mg | ORAL_TABLET | Freq: Two times a day (BID) | ORAL | 1 refills | Status: DC

## 2021-10-26 NOTE — Patient Instructions (Signed)
Anemia °Anemia is a condition in which there is not enough red blood cells or hemoglobin in the blood. Hemoglobin is a substance in red blood cells that carries oxygen. °When you do not have enough red blood cells or hemoglobin (are anemic), your body cannot get enough oxygen and your organs may not work properly. As a result, you may feel very tired or have other problems. °What are the causes? °Common causes of anemia include: °Excessive bleeding. Anemia can be caused by excessive bleeding inside or outside the body, including bleeding from the intestines or from heavy menstrual periods in females. °Poor nutrition. °Long-lasting (chronic) kidney, thyroid, and liver disease. °Bone marrow disorders, spleen problems, and blood disorders. °Cancer and treatments for cancer. °HIV (human immunodeficiency virus) and AIDS (acquired immunodeficiency syndrome). °Infections, medicines, and autoimmune disorders that destroy red blood cells. °What are the signs or symptoms? °Symptoms of this condition include: °Minor weakness. °Dizziness. °Headache, or difficulties concentrating and sleeping. °Heartbeats that feel irregular or faster than normal (palpitations). °Shortness of breath, especially with exercise. °Pale skin, lips, and nails, or cold hands and feet. °Indigestion and nausea. °Symptoms may occur suddenly or develop slowly. If your anemia is mild, you may not have symptoms. °How is this diagnosed? °This condition is diagnosed based on blood tests, your medical history, and a physical exam. In some cases, a test may be needed in which cells are removed from the soft tissue inside of a bone and looked at under a microscope (bone marrow biopsy). Your health care provider may also check your stool (feces) for blood and may do additional testing to look for the cause of your bleeding. °Other tests may include: °Imaging tests, such as a CT scan or MRI. °A procedure to see inside your esophagus and stomach (endoscopy). °A  procedure to see inside your colon and rectum (colonoscopy). °How is this treated? °Treatment for this condition depends on the cause. If you continue to lose a lot of blood, you may need to be treated at a hospital. Treatment may include: °Taking supplements of iron, vitamin B12, or folic acid. °Taking a hormone medicine (erythropoietin) that can help to stimulate red blood cell growth. °Having a blood transfusion. This may be needed if you lose a lot of blood. °Making changes to your diet. °Having surgery to remove your spleen. °Follow these instructions at home: °Take over-the-counter and prescription medicines only as told by your health care provider. °Take supplements only as told by your health care provider. °Follow any diet instructions that you were given by your health care provider. °Keep all follow-up visits as told by your health care provider. This is important. °Contact a health care provider if: °You develop new bleeding anywhere in the body. °Get help right away if: °You are very weak. °You are [place] of breath. °You have pain in your abdomen or chest. °You are dizzy or feel faint. °You have trouble concentrating. °You have bloody stools, black stools, or tarry stools. °You vomit repeatedly or you vomit up blood. °These symptoms may represent a serious problem that is an emergency. Do not wait to see if the symptoms will go away. Get medical help right away. Call your local emergency services (911 in the [place] Do not drive yourself to the hospital. °Summary °Anemia is a condition in which you do not have enough red blood cells or enough of a substance in your red blood cells that carries oxygen (hemoglobin). °Symptoms may occur suddenly or develop slowly. °If your anemia is   mild, you may not have symptoms. °This condition is diagnosed with blood tests, a medical history, and a physical exam. Other tests may be needed. °Treatment for this condition depends on the cause of the anemia. °This  information is not intended to replace advice given to you by your health care provider. Make sure you discuss any questions you have with your health care provider. °Document Revised: 07/23/2019 Document Reviewed: 07/23/2019 °Elsevier Patient Education © 2022 [employer] ° °

## 2021-10-26 NOTE — Progress Notes (Signed)
Subjective:  Patient ID: Nicholas Burton, male    DOB: 07-13-1928  Age: 88 y.o. MRN: [id number]  CC: Anemia, Diabetes, and Hypertension  This visit occurred during the SARS-CoV-2 public health emergency.  Safety protocols were in place, including screening questions prior to the visit, additional usage of staff PPE, and extensive cleaning of exam room while observing appropriate contact time as indicated for disinfecting solutions.    HPI [name] presents for f/up - He feels well today.  Offers no new complaints.  Outpatient Medications Prior to Visit  Medication Sig Dispense Refill   aspirin EC 81 MG tablet Take 81 mg by mouth daily. Swallow whole.     atorvastatin (LIPITOR) 10 MG tablet Take 1 tablet (10 mg total) by mouth daily. 90 tablet 3   Blood Glucose Monitoring Suppl (FREESTYLE FREEDOM LITE) W/DEVICE KIT Use to check blood sugars daily Dx E11.8 1 each 0   cholecalciferol (VITAMIN D) 1000 units tablet Take 1,000 Units by mouth daily.     Continuous Blood Gluc Receiver (FREESTYLE LIBRE 2 READER) DEVI 1 Act by Does not apply route daily. 2 each 5   Continuous Blood Gluc Sensor (FREESTYLE LIBRE 2 SENSOR) MISC 1 Act by Does not apply route daily. 2 each 5   Dietary Management Product (VASCULERA) TABS Take 1 tablet by mouth daily. 30 tablet 11   FREESTYLE LITE test strip USE AS INSTRUCTED TO CHECK BLOOD SUGAR TWICE A DAY 200 strip 3   Glucagon (GVOKE HYPOPEN 2-PACK) 1 MG/0.2ML SOAJ Inject 1 Act into the skin daily as needed. 2 mL 6   Insulin Pen Needle (B-D ULTRAFINE III [place] PEN) 31G X 8 MM MISC USE TO INJECT INSULIN DAILY 300 each 1   Lancets (FREESTYLE) lancets Use to check blood sugars twice day Dx E11.8 180 each 3   LANTUS SOLOSTAR 100 UNIT/ML Solostar Pen INJECT 0.3ML (30 UNITS TOTAL) INTO THE SKIN AT BEDTIME 15 mL 3   losartan (COZAAR) 25 MG tablet Take 1 tablet (25 mg total) by mouth daily. 90 tablet 3   metoprolol tartrate (LOPRESSOR) 25 MG tablet TAKE ONE-HALF (1/2)  TABLET BY MOUTH TWICE A DAY 90 tablet 3   Multiple Vitamins-Minerals (PRESERVISION/LUTEIN) CAPS Take 1 capsule by mouth 2 (two) times daily.     torsemide (DEMADEX) 10 MG tablet Take 1 tablet (10 mg total) by mouth daily. 90 tablet 3   vitamin B-12 (CYANOCOBALAMIN) 500 MCG tablet Take 500 mcg by mouth daily.     Colesevelam HCl 3.75 g PACK MIX THE CONTENTS OF 1 PACKET IN FLUID AND DRINK DAILY 90 packet 1   metFORMIN (GLUCOPHAGE) 500 MG tablet TAKE 1 TABLET TWICE A DAY WITH MEALS 180 tablet 1   Vibegron (GEMTESA) 75 MG TABS Take 1 tablet by mouth daily. 90 tablet 1   No facility-administered medications prior to visit.    ROS Review of Systems  Constitutional: Negative.  Negative for chills, diaphoresis and unexpected weight change.  HENT: Negative.    Eyes: Negative.   Respiratory:  Negative for cough, chest tightness, shortness of breath and wheezing.   Cardiovascular:  Negative for chest pain, palpitations and leg swelling.  Gastrointestinal:  Negative for abdominal pain and diarrhea.  Endocrine: Positive for polyuria. Negative for polyphagia.  Genitourinary:  Positive for frequency. Negative for difficulty urinating and flank pain.  Musculoskeletal:  Positive for arthralgias. Negative for myalgias.  Skin: Negative.   Neurological: Negative.  Negative for dizziness and weakness.  Hematological:  Negative  for adenopathy. Does not bruise/bleed easily.  Psychiatric/Behavioral: Negative.     Objective:  BP 140/78 (BP Location: Right Arm, Patient Position: Sitting, Cuff Size: Large)    Pulse 91    Temp 98.1 F (36.7 C) (Oral)    Ht _0  (1.753 m)    Wt 195 lb (88.5 kg)    SpO2 96%    BMI 28.80 kg/m   BP Readings from Last 3 Encounters:  10/26/21 140/78  07/09/21 130/85  07/02/21 124/76    Wt Readings from Last 3 Encounters:  10/26/21 195 lb (88.5 kg)  07/09/21 196 lb (88.9 kg)  07/02/21 198 lb (89.8 kg)    Physical Exam Vitals reviewed.  Constitutional:      Appearance:  He is not ill-appearing.  HENT:     Mouth/Throat:     Mouth: Mucous membranes are moist.  Eyes:     General: No scleral icterus.    Conjunctiva/sclera: Conjunctivae normal.  Cardiovascular:     Rate and Rhythm: Normal rate and regular rhythm.     Heart sounds: No murmur heard. Pulmonary:     Breath sounds: Examination of the right-middle field reveals rhonchi. Examination of the left-middle field reveals rhonchi. Rhonchi present. No wheezing or rales.  Chest:     Chest wall: No tenderness.  Abdominal:     Palpations: There is no mass.     Tenderness: There is no abdominal tenderness. There is no guarding.  Musculoskeletal:        General: Normal range of motion.     Cervical back: Neck supple.     Right lower leg: No edema.     Left lower leg: No edema.  Lymphadenopathy:     Cervical: No cervical adenopathy.  Skin:    General: Skin is warm and dry.  Neurological:     General: No focal deficit present.     Mental Status: He is alert.    Lab Results  Component Value Date   WBC 9.8 10/26/2021   HGB 12.4 (L) 10/26/2021   HCT 37.6 (L) 10/26/2021   PLT 219.0 10/26/2021   GLUCOSE 95 10/26/2021   CHOL 83 09/17/2019   TRIG 136.0 09/17/2019   HDL 36.40 (L) 09/17/2019   LDLCALC 19 09/17/2019   ALT 13 09/17/2019   AST 17 09/17/2019   NA 140 10/26/2021   K 4.1 10/26/2021   CL 104 10/26/2021   CREATININE 1.58 (H) 10/26/2021   BUN 25 (H) 10/26/2021   CO2 29 10/26/2021   TSH 4.25 09/17/2019   PSA 11.21 (H) 10/30/2017   INR 1.3 ratio (H) 08/11/2010   HGBA1C 7.7 (H) 10/26/2021   MICROALBUR 0.7 09/17/2019    DG Chest 2 View  Result Date: 05/16/2019 CLINICAL DATA:  Cough and wheezing for 2 days. EXAM: CHEST - 2 VIEW COMPARISON:  02/05/2016. FINDINGS: Cardiac silhouette is normal in size. No mediastinal or hilar masses. No evidence of adenopathy. Clear lungs.  No pleural effusion or pneumothorax. Skeletal structures are intact. IMPRESSION: No active cardiopulmonary disease.  Electronically Signed   By: Lajean Manes M.D.   On: 05/16/2019 14:38    Assessment & Plan:   Juvon was seen today for anemia, diabetes and hypertension.  Diagnoses and all orders for this visit:  Essential hypertension- His blood pressure is adequately well controlled. -     Basic metabolic panel; Future -     Basic metabolic panel  Type II diabetes mellitus with manifestations ([place])- His blood sugar is adequately well  controlled. -     Basic metabolic panel; Future -     Hemoglobin A1c; Future -     metFORMIN (GLUCOPHAGE) 500 MG tablet; Take 1 tablet (500 mg total) by mouth 2 (two) times daily with a meal. -     Hemoglobin A1c -     Basic metabolic panel  CRI (chronic renal insufficiency), stage 3 (moderate)- His renal function is stable. -     Basic metabolic panel; Future -     Urinalysis, Routine w reflex microscopic; Future -     Urinalysis, Routine w reflex microscopic -     Basic metabolic panel  C37 deficiency -     CBC with Differential/Platelet; Future -     Vitamin B12; Future -     Folate; Future -     Folate -     Vitamin B12 -     CBC with Differential/Platelet  Iron deficiency anemia secondary to inadequate dietary iron intake- H/H have improved. -     CBC with Differential/Platelet; Future -     IBC + Ferritin; Future -     IBC + Ferritin -     CBC with Differential/Platelet  OAB (overactive bladder) -     Vibegron (GEMTESA) 75 MG TABS; Take 1 tablet by mouth daily. -     Urinalysis, Routine w reflex microscopic; Future -     Urinalysis, Routine w reflex microscopic  Insulin-requiring or dependent type II diabetes mellitus (HCC) -     Colesevelam HCl 3.75 g PACK; Take 1 packet by mouth daily. -     metFORMIN (GLUCOPHAGE) 500 MG tablet; Take 1 tablet (500 mg total) by mouth 2 (two) times daily with a meal.  Hyperlipidemia with target LDL less than 100 -     Colesevelam HCl 3.75 g PACK; Take 1 packet by mouth daily.   I have changed [name]. Rinella's  Colesevelam HCl and metFORMIN. I am also having him maintain his PreserVision/Lutein, FreeStyle Freedom Lite, freestyle, vitamin B-12, cholecalciferol, Gvoke HypoPen 2-Pack, B-D ULTRAFINE III [place] PEN, FREESTYLE LITE, Vasculera, FreeStyle Libre 2 Sensor, [employer] 2 Reader, Lantus SoloStar, torsemide, metoprolol tartrate, losartan, atorvastatin, aspirin EC, and Gemtesa.  Meds ordered this encounter  Medications   Colesevelam HCl 3.75 g PACK    Sig: Take 1 packet by mouth daily.    Dispense:  90 packet    Refill:  1   metFORMIN (GLUCOPHAGE) 500 MG tablet    Sig: Take 1 tablet (500 mg total) by mouth 2 (two) times daily with a meal.    Dispense:  180 tablet    Refill:  1   Vibegron (GEMTESA) 75 MG TABS    Sig: Take 1 tablet by mouth daily.    Dispense:  90 tablet    Refill:  1     Follow-up: Return in about 6 months (around 04/25/2022).  Scarlette Calico, MD

## 2021-10-27 ENCOUNTER — Telehealth: Payer: Self-pay

## 2021-10-27 ENCOUNTER — Ambulatory Visit (INDEPENDENT_AMBULATORY_CARE_PROVIDER_SITE_OTHER): Payer: Medicare Other | Admitting: *Deleted

## 2021-10-27 DIAGNOSIS — I5042 Chronic combined systolic (congestive) and diastolic (congestive) heart failure: Secondary | ICD-10-CM

## 2021-10-27 DIAGNOSIS — E118 Type 2 diabetes mellitus with unspecified complications: Secondary | ICD-10-CM

## 2021-10-27 NOTE — Progress Notes (Signed)
? ? ?Chronic Care Management ?Pharmacy Assistant  ? ?Name: RYLEI CODISPOTI  MRN: [id number] DOB: 02-19-1928 ? ?Nicholas Burton is an 88 y.o. year old male who presents for his follow-up CCM visit with the clinical pharmacist. ? ?Reason for Encounter: Disease State-General Call ?  ? ?Recent office visits:  ?10/26/21 [name], MD-PCP (Anemia, diabetes and hypertension) Blood work ordered, medication Changes: Colesevelam hcl 3.75g pack, metformin 500 mg bid  ? ?Recent consult visits:  ?None ID ? ?Hospital visits:  ?None in previous 6 months ? ?Medications: ?Outpatient Encounter Medications as of 10/27/2021  ?Medication Sig  ? aspirin EC 81 MG tablet Take 81 mg by mouth daily. Swallow whole.  ? atorvastatin (LIPITOR) 10 MG tablet Take 1 tablet (10 mg total) by mouth daily.  ? Blood Glucose Monitoring Suppl (FREESTYLE FREEDOM LITE) W/DEVICE KIT Use to check blood sugars daily ?Dx E11.8  ? cholecalciferol (VITAMIN D) 1000 units tablet Take 1,000 Units by mouth daily.  ? Colesevelam HCl 3.75 g PACK Take 1 packet by mouth daily.  ? Continuous Blood Gluc Receiver (FREESTYLE LIBRE 2 READER) DEVI 1 Act by Does not apply route daily.  ? Continuous Blood Gluc Sensor (FREESTYLE LIBRE 2 SENSOR) MISC 1 Act by Does not apply route daily.  ? Dietary Management Product (VASCULERA) TABS Take 1 tablet by mouth daily.  ? FREESTYLE LITE test strip USE AS INSTRUCTED TO CHECK BLOOD SUGAR TWICE A DAY  ? Glucagon (GVOKE HYPOPEN 2-PACK) 1 MG/0.2ML SOAJ Inject 1 Act into the skin daily as needed.  ? Insulin Pen Needle (B-D ULTRAFINE III [place] PEN) 31G X 8 MM MISC USE TO INJECT INSULIN DAILY  ? Lancets (FREESTYLE) lancets Use to check blood sugars twice day ?Dx E11.8  ? LANTUS SOLOSTAR 100 UNIT/ML Solostar Pen INJECT 0.3ML (30 UNITS TOTAL) INTO THE SKIN AT BEDTIME  ? losartan (COZAAR) 25 MG tablet Take 1 tablet (25 mg total) by mouth daily.  ? metFORMIN (GLUCOPHAGE) 500 MG tablet Take 1 tablet (500 mg total) by mouth 2 (two) times daily with a  meal.  ? metoprolol tartrate (LOPRESSOR) 25 MG tablet TAKE ONE-HALF (1/2) TABLET BY MOUTH TWICE A DAY  ? Multiple Vitamins-Minerals (PRESERVISION/LUTEIN) CAPS Take 1 capsule by mouth 2 (two) times daily.  ? torsemide (DEMADEX) 10 MG tablet Take 1 tablet (10 mg total) by mouth daily.  ? Vibegron (GEMTESA) 75 MG TABS Take 1 tablet by mouth daily.  ? vitamin B-12 (CYANOCOBALAMIN) 500 MCG tablet Take 500 mcg by mouth daily.  ? ?No facility-administered encounter medications on file as of 10/27/2021.  ? ?Have you had any problems recently with your health? ?Spoke with patient wife who states that patient had a wellness exam yesterday at Dr. Ronnald Ramp. He also had physical at the [facility] hospital. She states that the patient take 50 mg of b12. She stated that patient has a palliative nurse that calls once a month to check on him ? ?Have you had any problems with your pharmacy?She states that patient has not problems with getting medications form the pharmacy they have express scripts so they come in the mail. ? ?What issues or side effects are you having with your medications?She states that he does not have any side effects that she knows of ? ?What would you like me to pass along to [facility] for them to help you with? She states that patient is doing ok and she appreciates the phone call ? ?What can we do to take care  of you better? She states that patient does not need anything at this time ? ?Care Gaps: ?Colonoscopy-NA ?Diabetic Foot Exam-09/17/19 ?Ophthalmology-09/05/19 ?Dexa Scan - Na ?Annual Well Visit - NA ?Micro albumin-NA ?Hemoglobin A1c- 10/26/21 ? ?Star Rating Drugs: ? ?Ethelene Hal ?Clinical Pharmacist Assistant ?[phone]  ?

## 2021-10-27 NOTE — Patient Instructions (Signed)
Visit Information  [name], thank you for taking time to talk with me today about Nicholas Burton's health care needs. Please don't hesitate to contact me if I can be of assistance to you before our next scheduled telephone appointment.  Below are the goals we discussed today:  Patient Self-Care Activities: Patient Nicholas Burton will: Take medications as prescribed Attend all scheduled provider appointments Call pharmacy for medication refills Call provider office for new concerns or questions Continue checking fasting (first thing in the morning, before eating) blood sugars, and then after-eating blood sugars, 2 hours after a regular meal Write down on paper your blood sugars so we can review them together the next time we talk Consider checking your weights at home every day and writing them down on paper or your calendar; if you are not willing to weigh yourself every day, please do so several times a week: overnight weight gain of 3 or more pounds is the earliest indicator that you may be retaining fluid-- the weight you reported today was "195 lbs" at PCP office visit yesterday Try to take steps to better follow heart healthy, low salt, low cholesterol, carbohydrate-modified, low sugar diet Keep up the good work preventing falls at home- continue to use your cane as needed  Our next scheduled telephone follow up visit/ appointment is scheduled on:   Monday, Dec 27, 2021 at 3:00 pm- This is a PHONE [place] appointment  If you need to cancel or re-schedule our visit, please call [phone] and our care guide team will be happy to assist you.   I look forward to hearing about your progress.   [name], RN, BSN, [place] [phone]: direct office  If you are experiencing a Mental Health or [place] or need someone to talk to, please  call the Suicide and Crisis Lifeline: 988 call the [place] National Suicide Prevention Lifeline:  804 650 7883 or TTY: 306-874-5786 TTY (586) 338-7910) to talk to a trained counselor call 1-800-273-TALK (toll free, 24 hour hotline) go to [facility] [address] [place] [phone]) call 911   The patient's caregiver/ spouse verbalized understanding of instructions, educational materials, and care plan provided today and agreed to receive a mailed copy of patient instructions, educational materials, and care plan Living With Diabetes Diabetes (type 1 diabetes mellitus or type 2 diabetes mellitus) is a condition in which the body does not have enough of a hormone called insulin, or the body does not respond properly to insulin. Normally, insulin allows sugars (glucose) to enter cells in the body. With diabetes, extra glucose builds up in the blood instead of going into cells. This results in high blood glucose (hyperglycemia). How to manage lifestyle changes Managing diabetes includes medical treatments as well as lifestyle changes. If diabetes is not managed well, serious physical and emotional complications can occur. Taking good care of yourself means that you are responsible for: Monitoring glucose regularly. Eating a healthy diet. Exercising regularly. Meeting with health care providers. Taking medicines as directed. Most people feel some stress about managing their diabetes. When this stress becomes too much, it is known as diabetes-related distress. This is very common. Living with diabetes can place you at risk for diabetes distress, depression, or anxiety. These disorders can make diabetes more difficult to manage. How to recognize stress You may have diabetes distress if you: Avoid or ignore your daily diabetes care. This includes glucose testing, following a meal plan, and  taking medications. Feel overwhelmed by your daily diabetes care. Experience emotional reactions such as anger, sadness, or fear related to your daily diabetes  care. Feel fear or shame about not doing everything perfectly that you have been told to do. Emotional distress Symptoms of diabetes distress include: Anger about having a diagnosis of diabetes. Fear or frustration about your diagnosis and the changes you need to make to manage the condition. Being overly worried about the care that you need or the cost of the care that you need. Feeling like you caused your condition by doing something wrong. Fear about unpredictable fluctuations in your blood glucose, like low or high blood glucose. Feeling judged by your health care providers. Feeling very alone with the disease. Depression Having diabetes means that you are at a higher risk for depression. Your health care provider may test (screen) you for symptoms of depression. It is important to recognize symptoms and to start treatment for depression soon after it is diagnosed. The following are some symptoms of depression: Loss of interest in things that you used to enjoy. Feeling depressed much or most of the time. A change in appetite. Trouble getting to sleep or staying asleep. Feeling tired most of the day. Feeling nervous and anxious. Feeling guilty and worrying that you are a burden to others. Having thoughts of hurting yourself or feeling that you want to die. If you have any of these symptoms, more days than not, for 2 weeks or longer, you may have depression. This would be a good time to contact your health care provider. Follow these instructions at home: Managing diabetes distress The following are some ways to manage emotional distress: Learn as much as you can about diabetes and its treatment. Take one step at a time to improve your management. Meet with a certified diabetes care and education specialist. Take a class to learn how to manage your condition. Consider working with a counselor or therapist. Keep a journal of your thoughts and concerns. Accept that some things are out  of your control. Talk with other people who have diabetes. It can help to talk about the distress that you feel. Find ways to manage stress that work for you. These may include art or music therapy, exercise, meditation, and hobbies. Seek support from spiritual leaders, family, and friends.  General instructions Do your best to follow your diabetes management plan. If you are struggling to follow your plan, talk with a certified diabetes care and education specialist, or with someone else who has diabetes. They may have ideas that will help. Forgive yourself for not being perfect. Almost everyone struggles with the tasks of diabetes. Keep all follow-up visits. This is important. Where to find support Search for information and support from the American Diabetes Association: www.diabetes.org Find a certified diabetes education and care specialist. Make an appointment through the Association of Diabetes Care & Education Specialists: www.diabeteseducator.org Contact a health care provider if: You believe your diabetes is getting out of control. You are concerned you may be depressed. You think your medications are not helping control your diabetes. You are feeling overwhelmed with your diabetes. Get help right away if: You have thoughts about hurting yourself or others. If you ever feel like you may hurt yourself or others, or have thoughts about taking your own life, get help right away. You can go to your nearest emergency department or call: Your local emergency services (911 in the [place] A suicide crisis helpline, such as the [place] Suicide  Prevention Lifeline at 636-613-0454 or 988 in the [place] This is open 24 hours a day. Summary Diabetes (type 1 diabetes mellitus or type 2 diabetes mellitus) is a condition in which the body does not have enough of a hormone called insulin, or the body does not respond properly to insulin. Living with diabetes puts you at risk for medical and  emotional issues, such as diabetes distress, depression, and anxiety. Recognizing the symptoms of diabetes distress and depression may help you avoid problems with your diabetes control. If you experience symptoms, it is important to discuss this with your health care provider, certified diabetes care and education specialist, or therapist. It is important to start treatment for diabetes distress and depression soon after diagnosis. Ask your health care provider to recommend a therapist who understands both depression and diabetes. This information is not intended to replace advice given to you by your health care provider. Make sure you discuss any questions you have with your health care provider. Document Revised: 03/10/2021 Document Reviewed: 12/26/2019 Elsevier Patient Education  [place].

## 2021-10-28 NOTE — Chronic Care Management (AMB) (Signed)
Chronic Care Management   CCM RN Visit Note  10/28/2021 Name: Nicholas Burton MRN: [id number] DOB: 12/12/1927  Subjective: Nicholas Burton is a 88 y.o. year old male who is a primary care patient of [name], MD. The care management team was consulted for assistance with disease management and care coordination needs.    Engaged with patient's spouse/ caregiver [name], whom patient provided verbal consent for me to speak with "at any time," on 07/21/21, by telephone for follow up visit in response to provider referral for case management and/or care coordination services.   Consent to Services:  The patient was given information about Chronic Care Management services, agreed to services, and gave verbal consent prior to initiation of services.  Please see initial visit note for detailed documentation.  Patient agreed to services and verbal consent obtained.   Assessment: Review of patient past medical history, allergies, medications, health status, including review of consultants reports, laboratory and other test data, was performed as part of comprehensive evaluation and provision of chronic care management services.  CCM Care Plan  Allergies  Allergen Reactions   Ace Inhibitors     REACTION: cough   Oxycodone-Acetaminophen    Penicillins    Outpatient Encounter Medications as of 10/27/2021  Medication Sig   aspirin EC 81 MG tablet Take 81 mg by mouth daily. Swallow whole.   atorvastatin (LIPITOR) 10 MG tablet Take 1 tablet (10 mg total) by mouth daily.   Blood Glucose Monitoring Suppl (FREESTYLE FREEDOM LITE) W/DEVICE KIT Use to check blood sugars daily Dx E11.8   cholecalciferol (VITAMIN D) 1000 units tablet Take 1,000 Units by mouth daily.   Colesevelam HCl 3.75 g PACK Take 1 packet by mouth daily.   Continuous Blood Gluc Receiver (FREESTYLE LIBRE 2 READER) DEVI 1 Act by Does not apply route daily.   Continuous Blood Gluc Sensor (FREESTYLE LIBRE 2 SENSOR) MISC 1 Act by Does not  apply route daily.   Dietary Management Product (VASCULERA) TABS Take 1 tablet by mouth daily.   FREESTYLE LITE test strip USE AS INSTRUCTED TO CHECK BLOOD SUGAR TWICE A DAY   Glucagon (GVOKE HYPOPEN 2-PACK) 1 MG/0.2ML SOAJ Inject 1 Act into the skin daily as needed.   Insulin Pen Needle (B-D ULTRAFINE III [place] PEN) 31G X 8 MM MISC USE TO INJECT INSULIN DAILY   Lancets (FREESTYLE) lancets Use to check blood sugars twice day Dx E11.8   LANTUS SOLOSTAR 100 UNIT/ML Solostar Pen INJECT 0.3ML (30 UNITS TOTAL) INTO THE SKIN AT BEDTIME   losartan (COZAAR) 25 MG tablet Take 1 tablet (25 mg total) by mouth daily.   metFORMIN (GLUCOPHAGE) 500 MG tablet Take 1 tablet (500 mg total) by mouth 2 (two) times daily with a meal.   metoprolol tartrate (LOPRESSOR) 25 MG tablet TAKE ONE-HALF (1/2) TABLET BY MOUTH TWICE A DAY   Multiple Vitamins-Minerals (PRESERVISION/LUTEIN) CAPS Take 1 capsule by mouth 2 (two) times daily.   torsemide (DEMADEX) 10 MG tablet Take 1 tablet (10 mg total) by mouth daily.   Vibegron (GEMTESA) 75 MG TABS Take 1 tablet by mouth daily.   vitamin B-12 (CYANOCOBALAMIN) 500 MCG tablet Take 500 mcg by mouth daily.   No facility-administered encounter medications on file as of 10/27/2021.   Patient Active Problem List   Diagnosis Date Noted   Hearing loss secondary to cerumen impaction, left 09/21/2020   Late onset Alzheimer's dementia without behavioral disturbance ([place]) 09/21/2020   Iron deficiency anemia secondary to inadequate  dietary iron intake 06/06/2019   Chronic combined systolic and diastolic congestive heart failure ([place]) 10/30/2018   PSA elevation 10/25/2018   CRI (chronic renal insufficiency), stage 3 (moderate) 10/25/2018   Venous stasis dermatitis of both lower extremities 01/29/2018   COPD with asthma ([place]) 03/02/2017   Obesity (BMI 30.0-34.9) 04/01/2013   BPH (benign prostatic hyperplasia) 09/08/2011   B12 deficiency 08/13/2010   Type II diabetes mellitus with  manifestations ([place]) 11/10/2008   Vitamin D deficiency 11/10/2008   Hyperlipidemia with target LDL less than 100 11/10/2008   Essential hypertension 11/10/2008   Osteoarthritis 11/10/2008   Conditions to be addressed/monitored:  CHF and DMII  Care Plan : RN Care Manager Plan of Care  Updates made by Knox Royalty, RN since 10/28/2021 12:00 AM     Problem: Chronic Disease Management Needs   Priority: High     Long-Range Goal: Development of plan of care for long term chronic disease management   Start Date: 07/21/2021  Expected End Date: 07/21/2022  Priority: High  Note:   Current Barriers:  Chronic Disease Management support and education needs related to CHF and DMII Hard of hearing: bilateral hearing aids Vision concerns: macular degeneration; gets regular retinal injections  RNCM Clinical Goal(s):  Patient will demonstrate ongoing health management independence as evidenced by adherence to plan of care for DMII, CHF        through collaboration with RN Care manager, provider, and care team.   Interventions: 1:1 collaboration with primary care provider regarding development and update of comprehensive plan of care as evidenced by provider attestation and co-signature Inter-disciplinary care team collaboration (see longitudinal plan of care) Evaluation of current treatment plan related to  self management and patient's adherence to plan as established by provider 10/27/21: Falls assessment updated: spouse/ caregiver denies recent falls since last outreach 09/09/21; positive reinforcement provided with encouragement to continue efforts at fall prevention; previously provided education around fall risks/ prevention reinforced; spouse reports patient continues using cane as needed/ indicated Confirmed no recent medication changes/ concerns; wife continues to assist in medication preparation/ administration due to patient's limited (baseline) vision  Reviewed yesterday's PCP office  visit with spouse: it appears metformin has been increased to BID; discussed with spouse, she verbalizes understanding Reviewed upcoming scheduled provider appointments: [facility] Pharmacy team 12/13/21; advised spouse that Dr. Ronnald Ramp recommended next PCP office visit in 6 months, encouraged her to consider scheduling next PCP appointment for late August-early September  Heart Failure Interventions:  (Status: 10/27/21: Goal on Track (progressing): YES.)  Long Term Goal  Basic overview and discussion of pathophysiology of Heart Failure reviewed Discussed importance of daily weight and advised patient to weigh and record daily Discussed the importance of keeping all appointments with provider spouse reports has continued occasionally monitoring/ recording weights at home: again reports weighing "once or twice a week;" reinforced previously provided education ideally around monitoring weight daily, spouse reports "I can't make him do anything;" states patient believes weekly weight monitoring is sufficient; reinforced education around early vs. late signs/ symptoms of fluid retention; spouse states she will continue to encouraged patient to weigh himself more often Confirmed spouse has no current concerns around patient's breathing status/ lower extremity swelling Reinforced with spouse previously provided education around signs/ symptoms yellow CHF zone along with corresponding action plan Reviewed recent weights at home: spouse does not know specific weights from inconsistent home monitoring; reports weight yesterday at PCP office visit as "195 lbs;" (verified); and says she does not think patient's  weights fluctuate much at home-- I again provided education around value of recording weights (when patient monitors) on a calendar or piece of paper-- however, she does not think patient will agree to record at home Spouse reports patient has not been monitoring blood pressures at home regularly- provided education  around value of occasionally monitoring/ recording blood pressures at home Spouse re-confirms patient not consistently following heart healthy, diabetes diet; reports again, "we try to limit salt"-- reinforced previously provided education around importance of adherence to prescribed heart healthy, low salt, low cholesterol diet  Diabetes:  (Status: 10/27/21: Goal on Track (progressing): YES.) Long Term Goal   Lab Results  Component Value Date   HGBA1C 7.7 (H) 10/26/2021  Reviewed prescribed diet with patient heart healthy, low sodium, carbohydrate modified, low sugar; Counseled on importance of regular laboratory monitoring as prescribed;        Discussed plans with patient for ongoing care management follow up and provided patient with direct contact information for care management team;      Review of patient status, including review of consultants reports, relevant laboratory and other test results, and medications completed;       Reviewed recent blood sugars with patient's spouse: we had previously discussed value of patient changing his home monitoring schedule to check blood sugars fasting in morning and then again 2-hours after eating regular sized meal; spouse reports patient has not consistently been monitoring blood sugars this way-- states, "he just has always checked his sugar right after he finishes eating;" she reports blood sugar values consistently > 200 at home Again provided education around optimal timing of home blood sugar monitoring, to determine if patient's medications are controlling blood sugar -- she again reports, "I keep telling him that, but I can't make him do it; I will keep trying"   Discussed newly ordered CGM with patient's spouse: she reports insurance will not cover, and states that patient is planning to seek CGM from [place] provider; states she "thinks" the [place] "might cover it;" this was encouraged; for now, they will continue to use glucometer Confirmed no recent  concerns around hypoglycemia; briefly reviewed signs/ symptoms of same along with corresponding action p[lan for same Assessed patient's spouse understanding of meaning/ significance of A1-C values: fair baseline understanding of same- Reviewed individual historical A1-C trends and provided education around correlation of A1-C value to blood sugar levels at home over 3 months: his A1-C result has increased slightly from previous value of 7.3 (04/22/22) Confirmed continues taking long acting insulin at 30 UqHS Patient's spouse continues to report he is not very adherent to following prescribed diet: reinforced previously provided education/ discussed benefit of making small changes to diet for lasting change  Patient Goals/Self-Care Activities: As evidenced by review of EHR, collaboration with care team, and patient reporting during CCM RN CM outreach,  Patient [place] will: Take medications as prescribed Attend all scheduled provider appointments Call pharmacy for medication refills Call provider office for new concerns or questions Continue checking fasting (first thing in the morning, before eating) blood sugars, and then after-eating blood sugars, 2 hours after a regular meal Write down on paper your blood sugars so we can review them together the next time we talk Consider checking your weights at home every day and writing them down on paper or your calendar; if you are not willing to weigh yourself every day, please do so several times a week: overnight weight gain of 3 or more pounds is the earliest indicator  that you may be retaining fluid-- the weight you reported today was "195 lbs" at PCP office visit yesterday Try to take steps to better follow heart healthy, low salt, low cholesterol, carbohydrate-modified, low sugar diet Keep up the good work preventing falls at home- continue to use your cane as needed    Plan: Telephone follow up appointment with care management team member scheduled  for:  Monday, Dec 27, 2021 at 3:00 pm The patient has been provided with contact information for the care management team and has been advised to call with any health related questions or concerns   [name], RN, BSN, [place] [phone]: direct office

## 2021-11-02 ENCOUNTER — Other Ambulatory Visit: Payer: Self-pay | Admitting: Internal Medicine

## 2021-11-02 DIAGNOSIS — N3281 Overactive bladder: Secondary | ICD-10-CM

## 2021-11-11 NOTE — Progress Notes (Signed)
Patient is currently enrolled in [place], patient will be unenrolled in [facility]. ? ?Ethelene Hal ?Clinical Pharmacist Assistant ?[phone]  ?

## 2021-11-17 DIAGNOSIS — Z08 Encounter for follow-up examination after completed treatment for malignant neoplasm: Secondary | ICD-10-CM | POA: Diagnosis not present

## 2021-11-17 DIAGNOSIS — L814 Other melanin hyperpigmentation: Secondary | ICD-10-CM | POA: Diagnosis not present

## 2021-11-17 DIAGNOSIS — Z85820 Personal history of malignant melanoma of skin: Secondary | ICD-10-CM | POA: Diagnosis not present

## 2021-11-17 DIAGNOSIS — L570 Actinic keratosis: Secondary | ICD-10-CM | POA: Diagnosis not present

## 2021-11-17 DIAGNOSIS — L821 Other seborrheic keratosis: Secondary | ICD-10-CM | POA: Diagnosis not present

## 2021-11-17 DIAGNOSIS — D225 Melanocytic nevi of trunk: Secondary | ICD-10-CM | POA: Diagnosis not present

## 2021-11-17 DIAGNOSIS — Z85828 Personal history of other malignant neoplasm of skin: Secondary | ICD-10-CM | POA: Diagnosis not present

## 2021-11-17 DIAGNOSIS — L448 Other specified papulosquamous disorders: Secondary | ICD-10-CM | POA: Diagnosis not present

## 2021-11-24 ENCOUNTER — Other Ambulatory Visit: Payer: Self-pay | Admitting: Internal Medicine

## 2021-11-24 DIAGNOSIS — E118 Type 2 diabetes mellitus with unspecified complications: Secondary | ICD-10-CM

## 2021-11-24 DIAGNOSIS — E119 Type 2 diabetes mellitus without complications: Secondary | ICD-10-CM

## 2021-11-26 DIAGNOSIS — E118 Type 2 diabetes mellitus with unspecified complications: Secondary | ICD-10-CM

## 2021-11-26 DIAGNOSIS — I5042 Chronic combined systolic (congestive) and diastolic (congestive) heart failure: Secondary | ICD-10-CM

## 2021-12-13 ENCOUNTER — Telehealth: Payer: Medicare Other

## 2021-12-13 ENCOUNTER — Encounter: Payer: Self-pay | Admitting: Internal Medicine

## 2021-12-14 ENCOUNTER — Telehealth: Payer: Self-pay | Admitting: Internal Medicine

## 2021-12-14 ENCOUNTER — Other Ambulatory Visit: Payer: Self-pay | Admitting: Internal Medicine

## 2021-12-14 DIAGNOSIS — E119 Type 2 diabetes mellitus without complications: Secondary | ICD-10-CM

## 2021-12-14 DIAGNOSIS — E785 Hyperlipidemia, unspecified: Secondary | ICD-10-CM

## 2021-12-14 MED ORDER — COLESEVELAM HCL 3.75 G PO PACK: 1.0000 | PACK | Freq: Every day | ORAL | 1 refills | Status: DC

## 2021-12-14 NOTE — Telephone Encounter (Signed)
1.Medication Requested: Colesevelam HCl 3.75 g PACK ? ?2. Pharmacy (Name, [facility]): [facility] [place] ? ?3. On Med List: Y ? ?4. Last Visit with PCP: 10-26-2021 ? ?5. Next visit date with PCP: n/a ? ?  ?Informed spouse pt still has a refill left ? ?Spouse states pharmacy informed her he did not ?

## 2021-12-17 DIAGNOSIS — H353211 Exudative age-related macular degeneration, right eye, with active choroidal neovascularization: Secondary | ICD-10-CM | POA: Diagnosis not present

## 2021-12-17 DIAGNOSIS — H3540 Unspecified peripheral retinal degeneration: Secondary | ICD-10-CM | POA: Diagnosis not present

## 2021-12-17 DIAGNOSIS — H353223 Exudative age-related macular degeneration, left eye, with inactive scar: Secondary | ICD-10-CM | POA: Diagnosis not present

## 2021-12-17 DIAGNOSIS — H43813 Vitreous degeneration, bilateral: Secondary | ICD-10-CM | POA: Diagnosis not present

## 2021-12-20 ENCOUNTER — Other Ambulatory Visit: Payer: Self-pay | Admitting: Internal Medicine

## 2021-12-20 DIAGNOSIS — E119 Type 2 diabetes mellitus without complications: Secondary | ICD-10-CM

## 2021-12-27 ENCOUNTER — Ambulatory Visit (INDEPENDENT_AMBULATORY_CARE_PROVIDER_SITE_OTHER): Payer: Medicare Other | Admitting: *Deleted

## 2021-12-27 DIAGNOSIS — I5042 Chronic combined systolic (congestive) and diastolic (congestive) heart failure: Secondary | ICD-10-CM

## 2021-12-27 DIAGNOSIS — E119 Type 2 diabetes mellitus without complications: Secondary | ICD-10-CM

## 2021-12-27 NOTE — Patient Instructions (Signed)
Visit Information ? ?Nicholas Burton, thank you for taking time to talk with me today about Barnell's health care needs. Please don't hesitate to contact me if I can be of assistance to you before our next scheduled telephone appointment. ? ?Below are the goals we discussed today:  ?Patient Self-Care Activities: ?Patient Imri will: ?Take medications as prescribed ?Attend all scheduled provider appointments ?Call pharmacy for medication refills ?Call provider office for new concerns or questions-- please schedule an appointment promptly with Dr. Ronnald Ramp to discuss the daily nosebleeds you reported today ?Continue checking fasting (first thing in the morning, before eating) blood sugars, and then after-eating blood sugars, 2 hours after a regular meal ?Write down on paper your blood sugars so we can review them together the next time we talk ?Consider checking your weights at home every day and writing them down on paper or your calendar; if you are not willing to weigh yourself every day, please do so several times a week: overnight weight gain of 3 or more pounds is the earliest indicator that you may be retaining fluid-- the weight ranges you reported today was "between 195- 198 lbs"  ?Try to take steps to better follow heart healthy, low salt, low cholesterol, carbohydrate-modified, low sugar diet ?Keep up the good work preventing falls at home- continue to use your cane as needed ? ?Our next scheduled telephone follow up visit/ appointment is scheduled on:   Wednesday, February 02, 2022 at 3:00 pm- This is a PHONE CALL appointment ? ?If you need to cancel or re-schedule our visit, please call [phone] and our care guide team will be happy to assist you. ?  ?I look forward to hearing about your progress. ?  ?[name], RN, BSN, CCRN Alumnus ?[place] ?([phone]: direct office ? ?If you are experiencing a Mental Health or [place] or need someone to talk to,  please  ?call the Suicide and Crisis Lifeline: 988 ?call the [place] National Suicide Prevention Lifeline: 410-464-2950 or TTY: 805 608 5815 TTY 248-111-6546) to talk to a trained counselor ?call 1-800-273-TALK (toll free, 24 hour hotline) ?go to [facility] [address] [place] [phone]) ?call 911  ? ?The patient's spouse/ caregiver verbalized understanding of instructions, educational materials, and care plan provided today and agreed to receive a mailed copy of patient instructions, educational materials, and care plan ? ?Nosebleed, Adult ?A nosebleed is when blood comes out of the nose. Nosebleeds are common. Usually, they are not a sign of a serious condition. ?Nosebleeds can happen if a blood vessel in your nose starts to bleed or if the lining of your nose (mucous membrane) cracks. They are commonly caused by: ?Allergies. ?Colds. ?Picking your nose. ?Blowing your nose too hard. ?An injury from sticking an object into your nose or getting hit in the nose. ?Dry or cold air. ?Less common causes of nosebleeds include: ?Toxic fumes. ?Something abnormal in the nose or in the air-filled spaces in the bones of the face (sinuses). ?Growths in the nose, such as polyps. ?Blood thinners or conditions that cause blood to clot slowly. ?Certain illnesses or procedures that irritate or dry out the nasal passages. ?Follow these instructions at home: ?When you have a nosebleed: ? ?Sit down and tilt your head slightly forward. ?Use a clean towel or tissue to pinch your nostrils under the bony part of your nose. After 5 minutes, let go of your nose and see if bleeding starts again.  Do not release pressure before that time. If there is still bleeding, repeat the pinching and holding for 5 minutes or until the bleeding stops. ?Do not place tissues or gauze in the nose to stop the bleeding. ?Avoid lying down and avoid tilting your head backward. That may make blood collect in the  throat and cause gagging or coughing. ?Use a nasal spray decongestant to help with a nosebleed as told by your health care provider. ?After a nosebleed: ?Avoid blowing your nose or sniffing for a number of hours. ?Avoid straining, lifting, or bending at the waist for several days. You may go back to other normal activities as you are able. ?If you are taking aspirin or blood thinners and you have nosebleeds, talk to your health care provider. These medicines make bleeding more likely. ?Ask your health care provider if you should stop taking the medicines or if you should adjust the dose. ?Do not stop taking medicines that your health care provider has recommended unless he or she tells you to stop taking them. ?If your nosebleed was caused by dry mucous membranes, use over-the-counter saline nasal spray or gel and a humidifier as told by your health care provider. This will keep the mucous membranes moist and allow them to heal. If you need to use one of these products: ?Choose one that is water-soluble. ?Use only as much as you need and use it only as often as needed. ?Do not lie down right after you use it. ?If you get nosebleeds often, talk with your health care provider about medical treatments. Options may include: ?Nasal cautery. This treatment stops and prevents nosebleeds by using a chemical swab or electrical device to lightly burn tiny blood vessels inside the nose. ?Nasal packing. A gauze or other material is placed in the nose to keep constant pressure on the bleeding area. ?Contact a health care provider if you: ?Have a fever. ?Get nosebleeds often or more often than usual. ?Bruise very easily. ?Have a nosebleed from having something stuck in your nose. ?Have bleeding in your mouth. ?Vomit or cough up brown material. ?Have a nosebleed after you start a new medicine. ?Get help right away if: ?You have a nosebleed after a fall or a head injury. ?Your nosebleed does not go away after 20 minutes. ?You feel  dizzy or weak. ?You have unusual bleeding from other parts of your body. ?You have unusual bruising on other parts of your body. ?You become sweaty. ?You vomit blood. ?Summary ?A nosebleed is when blood comes out of the nose. Common causes include allergies, an injury to the nose, or cold or dry air. ?Initial treatment includes applying pressure for 5 minutes. ?Moisturizing the nose with saline nasal spray or gel after a nosebleed may help prevent future bleeding. ?Get help right away if your nosebleed does not go away after 20 minutes. ?This information is not intended to replace advice given to you by your health care provider. Make sure you discuss any questions you have with your health care provider. ?Document Revised: 06/13/2019 Document Reviewed: 06/13/2019 ?Elsevier Patient Education ? 2022 [place]. ? ? ?

## 2021-12-27 NOTE — Chronic Care Management (AMB) (Signed)
?Chronic Care Management  ? ?CCM RN Visit Note ? ?12/27/2021 ?Name: Nicholas Burton MRN: [id number] DOB: 1927/10/28 ? ?Subjective: ?Nicholas Burton is a 88 y.o. year old male who is a primary care patient of [name], MD. The care management team was consulted for assistance with disease management and care coordination needs.   ? ?Engaged with patient and his spouse/ caregiver [name] on [place] by telephone for follow up visit in response to provider referral for case management and/or care coordination services.  ? ?Consent to Services:  ?The patient was given information about Chronic Care Management services, agreed to services, and gave verbal consent prior to initiation of services.  Please see initial visit note for detailed documentation.  ?Patient agreed to services and verbal consent obtained.  ? ?Assessment: Review of patient past medical history, allergies, medications, health status, including review of consultants reports, laboratory and other test data, was performed as part of comprehensive evaluation and provision of chronic care management services.  ? ?SDOH (Social Determinants of Health) assessments and interventions performed:  ?SDOH Interventions   ? ?Flowsheet Row Most Recent Value  ?SDOH Interventions   ?Food Insecurity Interventions Intervention Not Indicated  [spouse continues to deny food insecurity]  ?Housing Interventions Intervention Not Indicated  [continues to reside with spouse with additional support from other family members that live nearby and are actively involved]  ?Transportation Interventions Intervention Not Indicated  [family continues to provide transportation]  ? ?  ?  ? ?[place] ? ?Allergies  ?Allergen Reactions  ? Ace Inhibitors   ?  REACTION: cough  ? Oxycodone-Acetaminophen   ? Penicillins   ? ?Outpatient Encounter Medications as of 12/27/2021  ?Medication Sig Note  ? LANTUS SOLOSTAR 100 UNIT/ML Solostar Pen INJECT 0.3ML (30 UNITS TOTAL) INTO THE SKIN AT BEDTIME  12/27/2021: 12/27/21- spouse/ caregiver reports patient now taking 20 U QD in morning, per PCP instructions 12/13/21  ? aspirin EC 81 MG tablet Take 81 mg by mouth daily. Swallow whole. (Patient not taking: Reported on 12/27/2021) 12/27/2021: 12/27/21- reports new recently problematic, ongoing nosebleeds- encouraged to discuss with PCP promptly, as patient's caregiver/ spouse says patient continues to take ASA (states he refuses to stop as she has recommended)  ? atorvastatin (LIPITOR) 10 MG tablet Take 1 tablet (10 mg total) by mouth daily.   ? Blood Glucose Monitoring Suppl (FREESTYLE FREEDOM LITE) W/DEVICE KIT Use to check blood sugars daily ?Dx E11.8   ? cholecalciferol (VITAMIN D) 1000 units tablet Take 1,000 Units by mouth daily.   ? Colesevelam HCl 3.75 g PACK Take 1 packet by mouth daily.   ? Continuous Blood Gluc Receiver (FREESTYLE LIBRE 2 READER) DEVI 1 Act by Does not apply route daily.   ? Continuous Blood Gluc Sensor (FREESTYLE LIBRE 2 SENSOR) MISC 1 Act by Does not apply route daily.   ? Dietary Management Product (VASCULERA) TABS Take 1 tablet by mouth daily.   ? FREESTYLE LITE test strip USE AS INSTRUCTED TO CHECK BLOOD SUGAR TWICE A DAY   ? GEMTESA 75 MG TABS TAKE 1 TABLET DAILY   ? Glucagon (GVOKE HYPOPEN 2-PACK) 1 MG/0.2ML SOAJ Inject 1 Act into the skin daily as needed.   ? Insulin Pen Needle (B-D ULTRAFINE III Kim PEN) 31G X 8 MM MISC USE TO INJECT INSULIN DAILY   ? Lancets (FREESTYLE) lancets Use to check blood sugars twice day ?Dx E11.8   ? losartan (COZAAR) 25 MG tablet Take 1 tablet (25 mg  total) by mouth daily.   ? metFORMIN (GLUCOPHAGE) 500 MG tablet TAKE 1 TABLET TWICE A DAY WITH MEALS   ? metoprolol tartrate (LOPRESSOR) 25 MG tablet TAKE ONE-HALF (1/2) TABLET BY MOUTH TWICE A DAY   ? Multiple Vitamins-Minerals (PRESERVISION/LUTEIN) CAPS Take 1 capsule by mouth 2 (two) times daily.   ? torsemide (DEMADEX) 10 MG tablet Take 1 tablet (10 mg total) by mouth daily.   ? vitamin B-12 (CYANOCOBALAMIN)  500 MCG tablet Take 500 mcg by mouth daily.   ? ?No facility-administered encounter medications on file as of 12/27/2021.  ? ?Patient Active Problem List  ? Diagnosis Date Noted  ? Hearing loss secondary to cerumen impaction, left 09/21/2020  ? Late onset Alzheimer's dementia without behavioral disturbance ([place]) 09/21/2020  ? Iron deficiency anemia secondary to inadequate dietary iron intake 06/06/2019  ? Chronic combined systolic and diastolic congestive heart failure ([place]) 10/30/2018  ? PSA elevation 10/25/2018  ? CRI (chronic renal insufficiency), stage 3 (moderate) 10/25/2018  ? Venous stasis dermatitis of both lower extremities 01/29/2018  ? COPD with asthma ([place]) 03/02/2017  ? Obesity (BMI 30.0-34.9) 04/01/2013  ? BPH (benign prostatic hyperplasia) 09/08/2011  ? B12 deficiency 08/13/2010  ? Type II diabetes mellitus with manifestations ([place]) 11/10/2008  ? Vitamin D deficiency 11/10/2008  ? Hyperlipidemia with target LDL less than 100 11/10/2008  ? Essential hypertension 11/10/2008  ? Osteoarthritis 11/10/2008  ? ?Conditions to be addressed/monitored:  CHF and DMII ? ?Care Plan : RN Care Manager Plan of Care  ?Updates made by Knox Royalty, RN since 12/27/2021 12:00 AM  ?  ? ?Problem: Chronic Disease Management Needs   ?Priority: High  ?  ? ?Long-Range Goal: Ongoing adherence to established plan of care for long term chronic disease management   ?Start Date: 07/21/2021  ?Expected End Date: 07/21/2022  ?Priority: High  ?Note:   ?Current Barriers:  ?Chronic Disease Management support and education needs related to CHF and DMII ?Hard of hearing: bilateral hearing aids ? 12/27/21: spouse reports patient will be obtaining new hearing aids through [place] "soon" ?Vision concerns: macular degeneration; gets regular retinal injections ?12/27/21: ongoing lack of motivation/ resistance to following/ adhering to plan of care: spouse reports she "tries to stay on him;" however, "he doesn't really listen to me" ? ?RNCM Clinical  Goal(s):  ?Patient will demonstrate ongoing health management independence as evidenced by adherence to plan of care for DMII, CHF        through collaboration with RN Care manager, provider, and care team.  ? ?Interventions: ?1:1 collaboration with primary care provider regarding development and update of comprehensive plan of care as evidenced by provider attestation and co-signature ?Inter-disciplinary care team collaboration (see longitudinal plan of care) ?Evaluation of current treatment plan related to  self management and patient's adherence to plan as established by provider ?Review of patient status, including review of consultants reports, relevant laboratory and other test results, and medications completed ?SDOH updated: no new/ unmet concerns identified ?Depression screening updated: no concerns identified; patient denies depression, however his spouse worries that he is in fact depressed- encouraged to discuss her concerns with [place]'s PCP, as she attends provider office visits with patient ?Pain assessment updated: denies pain ?Falls assessment updated: continues to deny new/ recent falls since November 2022; confirms patient continues using cane regularly;  positive reinforcement provided with encouragement to continue efforts at fall prevention; previously provided education around fall risks/ prevention reinforced ?Medications discussed: spouse reports that she continues to "oversee" medication management;  however, patient decides what he will/ will not take ?Spouse tells me that patient has developed nose bleeding "3 or 4 times every day" x approximately one week-- spouse has recommended that patient stop taking ASA 81 mg po QD, at least temporarily, to see if this might help stop daily nose bleeds, but patient refuses to stop taking ASA ?Spouse tells me patient has "considered stopping" atorvastatin-- states "he said somebody told him that was a blood thinner;" I clarified the purpose of  atorvastatin ?Given the report of ongoing daily nose bleeds x 3-4 times per week-- I have recommended that spouse schedule prompt/ urgent PCP appointment to evaluate bleeding and clarify medications accordingly ?Note

## 2021-12-28 ENCOUNTER — Ambulatory Visit (INDEPENDENT_AMBULATORY_CARE_PROVIDER_SITE_OTHER): Payer: Medicare Other | Admitting: Internal Medicine

## 2021-12-28 ENCOUNTER — Encounter: Payer: Self-pay | Admitting: Internal Medicine

## 2021-12-28 VITALS — BP 138/84 | HR 82 | Temp 97.6°F | Ht 69.0 in | Wt 195.0 lb

## 2021-12-28 DIAGNOSIS — R040 Epistaxis: Secondary | ICD-10-CM | POA: Diagnosis not present

## 2021-12-28 DIAGNOSIS — Z794 Long term (current) use of insulin: Secondary | ICD-10-CM

## 2021-12-28 DIAGNOSIS — I10 Essential (primary) hypertension: Secondary | ICD-10-CM

## 2021-12-28 DIAGNOSIS — E119 Type 2 diabetes mellitus without complications: Secondary | ICD-10-CM | POA: Diagnosis not present

## 2021-12-28 DIAGNOSIS — D508 Other iron deficiency anemias: Secondary | ICD-10-CM

## 2021-12-28 LAB — IBC + FERRITIN
Ferritin: 29.7 ng/mL (ref 22.0–322.0)
Iron: 80 ug/dL (ref 42–165)
Saturation Ratios: 23.7 % (ref 20.0–50.0)
TIBC: 337.4 ug/dL (ref 250.0–450.0)
Transferrin: 241 mg/dL (ref 212.0–360.0)

## 2021-12-28 LAB — CBC WITH DIFFERENTIAL/PLATELET
Basophils Absolute: 0.1 10*3/uL (ref 0.0–0.1)
Basophils Relative: 0.5 % (ref 0.0–3.0)
Eosinophils Absolute: 0.4 10*3/uL (ref 0.0–0.7)
Eosinophils Relative: 3.2 % (ref 0.0–5.0)
HCT: 37 % — ABNORMAL LOW (ref 39.0–52.0)
Hemoglobin: 12.2 g/dL — ABNORMAL LOW (ref 13.0–17.0)
Lymphocytes Relative: 27.3 % (ref 12.0–46.0)
Lymphs Abs: 3.2 10*3/uL (ref 0.7–4.0)
MCHC: 32.9 g/dL (ref 30.0–36.0)
MCV: 88.3 fl (ref 78.0–100.0)
Monocytes Absolute: 1.2 10*3/uL — ABNORMAL HIGH (ref 0.1–1.0)
Monocytes Relative: 9.9 % (ref 3.0–12.0)
Neutro Abs: 6.9 10*3/uL (ref 1.4–7.7)
Neutrophils Relative %: 59.1 % (ref 43.0–77.0)
Platelets: 229 10*3/uL (ref 150.0–400.0)
RBC: 4.19 Mil/uL — ABNORMAL LOW (ref 4.22–5.81)
RDW: 14.8 % (ref 11.5–15.5)
WBC: 11.7 10*3/uL — ABNORMAL HIGH (ref 4.0–10.5)

## 2021-12-28 LAB — HEMOGLOBIN A1C: Hgb A1c MFr Bld: 7.4 % — ABNORMAL HIGH (ref 4.6–6.5)

## 2021-12-28 LAB — PROTIME-INR
INR: 0.9 ratio (ref 0.8–1.0)
Prothrombin Time: 9.9 s (ref 9.6–13.1)

## 2021-12-28 LAB — APTT: aPTT: 29.8 s (ref 23.4–32.7)

## 2021-12-28 NOTE — Progress Notes (Signed)
? ?Subjective:  ?Patient ID: Nicholas Burton, male    DOB: June 26, 1928  Age: 88 y.o. MRN: [id number] ? ?CC: Diabetes ? ? ?HPI ?Nicholas Burton presents for f/up - ? ?He complains of a several day hx of left nosebleed - It he has not bled for 24 hours now. He has not stopped taking the aspirin. ? ?Outpatient Medications Prior to Visit  ?Medication Sig Dispense Refill  ? atorvastatin (LIPITOR) 10 MG tablet Take 1 tablet (10 mg total) by mouth daily. 90 tablet 3  ? Blood Glucose Monitoring Suppl (FREESTYLE FREEDOM LITE) W/DEVICE KIT Use to check blood sugars daily ?Dx E11.8 1 each 0  ? cholecalciferol (VITAMIN D) 1000 units tablet Take 1,000 Units by mouth daily.    ? Colesevelam HCl 3.75 g PACK Take 1 packet by mouth daily. 90 packet 1  ? Continuous Blood Gluc Receiver (FREESTYLE LIBRE 2 READER) DEVI 1 Act by Does not apply route daily. 2 each 5  ? Continuous Blood Gluc Sensor (FREESTYLE LIBRE 2 SENSOR) MISC 1 Act by Does not apply route daily. 2 each 5  ? Dietary Management Product (VASCULERA) TABS Take 1 tablet by mouth daily. 30 tablet 11  ? FREESTYLE LITE test strip USE AS INSTRUCTED TO CHECK BLOOD SUGAR TWICE A DAY 200 strip 3  ? GEMTESA 75 MG TABS TAKE 1 TABLET DAILY 90 tablet 1  ? Glucagon (GVOKE HYPOPEN 2-PACK) 1 MG/0.2ML SOAJ Inject 1 Act into the skin daily as needed. 2 mL 6  ? Insulin Pen Needle (B-D ULTRAFINE III [place] PEN) 31G X 8 MM MISC USE TO INJECT INSULIN DAILY 300 each 1  ? Lancets (FREESTYLE) lancets Use to check blood sugars twice day ?Dx E11.8 180 each 3  ? LANTUS SOLOSTAR 100 UNIT/ML Solostar Pen INJECT 0.3ML (30 UNITS TOTAL) INTO THE SKIN AT BEDTIME 27 mL 1  ? losartan (COZAAR) 25 MG tablet Take 1 tablet (25 mg total) by mouth daily. 90 tablet 3  ? metFORMIN (GLUCOPHAGE) 500 MG tablet TAKE 1 TABLET TWICE A DAY WITH MEALS 180 tablet 1  ? metoprolol tartrate (LOPRESSOR) 25 MG tablet TAKE ONE-HALF (1/2) TABLET BY MOUTH TWICE A DAY 90 tablet 3  ? Multiple Vitamins-Minerals (PRESERVISION/LUTEIN) CAPS Take 1  capsule by mouth 2 (two) times daily.    ? torsemide (DEMADEX) 10 MG tablet Take 1 tablet (10 mg total) by mouth daily. 90 tablet 3  ? vitamin B-12 (CYANOCOBALAMIN) 500 MCG tablet Take 500 mcg by mouth daily.    ? aspirin EC 81 MG tablet Take 81 mg by mouth daily. Swallow whole.    ? ?No facility-administered medications prior to visit.  ? ? ?ROS ?Review of Systems  ?Constitutional: Negative.  Negative for chills and fatigue.  ?HENT:  Positive for nosebleeds. Negative for postnasal drip and sinus pressure.   ?Eyes: Negative.   ?Respiratory: Negative.  Negative for cough, chest tightness, shortness of breath and wheezing.   ?Gastrointestinal:  Negative for abdominal pain, anal bleeding, blood in stool and diarrhea.  ?Endocrine: Negative.   ?Genitourinary:  Negative for hematuria.  ?Musculoskeletal: Negative.   ?Skin:  Negative for color change and pallor.  ?Neurological: Negative.  Negative for dizziness.  ?Hematological:  Negative for adenopathy. Does not bruise/bleed easily.  ?Psychiatric/Behavioral: Negative.    ? ?Objective:  ?BP 138/84 (BP Location: Right Arm, Patient Position: Sitting, Cuff Size: Large)   Pulse 82   Temp 97.6 ?F (36.4 ?C) (Oral)   Ht $R'5\' 9"'iZ$  (1.753 m)   Wt  195 lb (88.5 kg)   SpO2 97%   BMI 28.80 kg/m?  ? ?BP Readings from Last 3 Encounters:  ?12/28/21 138/84  ?10/26/21 140/78  ?07/09/21 130/85  ? ? ?Wt Readings from Last 3 Encounters:  ?12/28/21 195 lb (88.5 kg)  ?10/26/21 195 lb (88.5 kg)  ?07/09/21 196 lb (88.9 kg)  ? ? ?Physical Exam ?Vitals reviewed.  ?HENT:  ?   Nose: No nasal deformity, septal deviation, signs of injury, laceration or mucosal edema.  ?   Right Nostril: No foreign body, epistaxis, septal hematoma or occlusion.  ?   Left Nostril: Epistaxis present. No foreign body, septal hematoma or occlusion.  ?   Right Turbinates: Not enlarged, swollen or pale.  ?   Left Turbinates: Not enlarged, swollen or pale.  ?   Right Sinus: No maxillary sinus tenderness or frontal sinus  tenderness.  ?   Left Sinus: No maxillary sinus tenderness or frontal sinus tenderness.  ? ?   Mouth/Throat:  ?   Mouth: Mucous membranes are moist.  ?Cardiovascular:  ?   Rate and Rhythm: Normal rate and regular rhythm.  ?   Heart sounds: No murmur heard. ?Pulmonary:  ?   Effort: Pulmonary effort is normal.  ?   Breath sounds: No stridor. No wheezing, rhonchi or rales.  ?Abdominal:  ?   General: Abdomen is flat.  ?   Palpations: There is no mass.  ?   Tenderness: There is no guarding.  ?Musculoskeletal:     ?   General: Normal range of motion.  ?   Cervical back: Neck supple.  ?   Right lower leg: No edema.  ?Skin: ?   Findings: No bruising, ecchymosis or petechiae.  ?Neurological:  ?   Mental Status: He is alert.  ? ? ?Lab Results  ?Component Value Date  ? WBC 11.7 (H) 12/28/2021  ? HGB 12.2 (L) 12/28/2021  ? HCT 37.0 (L) 12/28/2021  ? PLT 229.0 12/28/2021  ? GLUCOSE 95 10/26/2021  ? CHOL 83 09/17/2019  ? TRIG 136.0 09/17/2019  ? HDL 36.40 (L) 09/17/2019  ? [place] 19 09/17/2019  ? ALT 13 09/17/2019  ? AST 17 09/17/2019  ? NA 140 10/26/2021  ? K 4.1 10/26/2021  ? CL 104 10/26/2021  ? CREATININE 1.58 (H) 10/26/2021  ? BUN 25 (H) 10/26/2021  ? CO2 29 10/26/2021  ? TSH 4.25 09/17/2019  ? PSA 11.21 (H) 10/30/2017  ? INR 0.9 12/28/2021  ? HGBA1C 7.4 (H) 12/28/2021  ? MICROALBUR 0.7 09/17/2019  ? ? ?DG Chest 2 View ? ?Result Date: 05/16/2019 ?CLINICAL DATA:  Cough and wheezing for 2 days. EXAM: CHEST - 2 VIEW COMPARISON:  02/05/2016. FINDINGS: Cardiac silhouette is normal in size. No mediastinal or hilar masses. No evidence of adenopathy. Clear lungs.  No pleural effusion or pneumothorax. Skeletal structures are intact. IMPRESSION: No active cardiopulmonary disease. Electronically Signed   By: Lajean Manes M.D.   On: 05/16/2019 14:38  ? ? ?Assessment & Plan:  ? ?Nicholas Burton was seen today for diabetes. ? ?Diagnoses and all orders for this visit: ? ?Left-sided epistaxis- Labs are negative for coagulopathy.  I offered him  reassurance. ?-     Protime-INR; Future ?-     APTT; Future ?-     APTT ?-     Protime-INR ? ?Essential hypertension- His blood pressure is adequately well controlled. ? ?Iron deficiency anemia secondary to inadequate dietary iron intake- His iron level is normal now. ?-  CBC with Differential/Platelet; Future ?-     IBC + Ferritin; Future ?-     IBC + Ferritin ?-     CBC with Differential/Platelet ? ?Insulin-requiring or dependent type II diabetes mellitus ([place])- His blood sugar is adequately well controlled. ?-     Hemoglobin A1c; Future ?-     Hemoglobin A1c ? ? ?I have discontinued Nicholas Shoulder. Pe[place]'s aspirin EC. I am also having him maintain his PreserVision/Lutein, FreeStyle [place] Lite, freestyle, vitamin B-12, cholecalciferol, [place]oke HypoPen 2-Pack, B-D ULTRAFINE III [place] PEN, FREESTYLE LITE, Vasculera, FreeStyle Libre 2 Sensor, [employer] 2 Reader, torsemide, metoprolol tartrate, losartan, atorvastatin, Gemtesa, metFORMIN, Colesevelam HCl, and Lantus SoloStar. ? ?No orders of the defined types were placed in this encounter. ? ? ? ?Follow-up: Return if symptoms worsen or fail to improve. ? ?Scarlette Calico, MD ?

## 2021-12-28 NOTE — Patient Instructions (Signed)
Nosebleed, Adult A nosebleed is when blood comes out of the nose. Nosebleeds are common. Usually, they are not a sign of a serious condition. Nosebleeds can happen if a blood vessel in your nose starts to bleed or if the lining of your nose (mucous membrane) cracks. They are commonly caused by: Allergies. Colds. Picking your nose. Blowing your nose too hard. An injury from sticking an object into your nose or getting hit in the nose. Dry or cold air. Less common causes of nosebleeds include: Toxic fumes. Something abnormal in the nose or in the air-filled spaces in the bones of the face (sinuses). Growths in the nose, such as polyps. Blood thinners or conditions that cause blood to clot slowly. Certain illnesses or procedures that irritate or dry out the nasal passages. Follow these instructions at home: When you have a nosebleed:  Sit down and tilt your head slightly forward. Use a clean towel or tissue to pinch your nostrils under the bony part of your nose. After 5 minutes, let go of your nose and see if bleeding starts again. Do not release pressure before that time. If there is still bleeding, repeat the pinching and holding for 5 minutes or until the bleeding stops. Do not place tissues or gauze in the nose to stop the bleeding. Avoid lying down and avoid tilting your head backward. That may make blood collect in the throat and cause gagging or coughing. Use a nasal spray decongestant to help with a nosebleed as told by your health care provider. After a nosebleed: Avoid blowing your nose or sniffing for a number of hours. Avoid straining, lifting, or bending at the waist for several days. You may go back to other normal activities as you are able. If you are taking aspirin or blood thinners and you have nosebleeds, talk to your health care provider. These medicines make bleeding more likely. Ask your health care provider if you should stop taking the medicines or if you should  adjust the dose. Do not stop taking medicines that your health care provider has recommended unless he or she tells you to stop taking them. If your nosebleed was caused by dry mucous membranes, use over-the-counter saline nasal spray or gel and a humidifier as told by your health care provider. This will keep the mucous membranes moist and allow them to heal. If you need to use one of these products: Choose one that is water-soluble. Use only as much as you need and use it only as often as needed. Do not lie down right after you use it. If you get nosebleeds often, talk with your health care provider about medical treatments. Options may include: Nasal cautery. This treatment stops and prevents nosebleeds by using a chemical swab or electrical device to lightly burn tiny blood vessels inside the nose. Nasal packing. A gauze or other material is placed in the nose to keep constant pressure on the bleeding area. Contact a health care provider if you: Have a fever. Get nosebleeds often or more often than usual. Bruise very easily. Have a nosebleed from having something stuck in your nose. Have bleeding in your mouth. Vomit or cough up brown material. Have a nosebleed after you start a new medicine. Get help right away if: You have a nosebleed after a fall or a head injury. Your nosebleed does not go away after 20 minutes. You feel dizzy or weak. You have unusual bleeding from other parts of your body. You have unusual bruising on   other parts of your body. You become sweaty. You vomit blood. Summary A nosebleed is when blood comes out of the nose. Common causes include allergies, an injury to the nose, or cold or dry air. Initial treatment includes applying pressure for 5 minutes. Moisturizing the nose with saline nasal spray or gel after a nosebleed may help prevent future bleeding. Get help right away if your nosebleed does not go away after 20 minutes. This information is not intended  to replace advice given to you by your health care provider. Make sure you discuss any questions you have with your health care provider. Document Revised: 06/13/2019 Document Reviewed: 06/13/2019 Elsevier Patient Education  2022 [employer]  

## 2022-01-26 DIAGNOSIS — I509 Heart failure, unspecified: Secondary | ICD-10-CM | POA: Diagnosis not present

## 2022-01-26 DIAGNOSIS — Z794 Long term (current) use of insulin: Secondary | ICD-10-CM

## 2022-01-26 DIAGNOSIS — E1159 Type 2 diabetes mellitus with other circulatory complications: Secondary | ICD-10-CM

## 2022-02-01 ENCOUNTER — Other Ambulatory Visit: Payer: Self-pay | Admitting: Internal Medicine

## 2022-02-01 DIAGNOSIS — Z794 Long term (current) use of insulin: Secondary | ICD-10-CM

## 2022-02-02 ENCOUNTER — Ambulatory Visit (INDEPENDENT_AMBULATORY_CARE_PROVIDER_SITE_OTHER): Payer: Medicare Other | Admitting: *Deleted

## 2022-02-02 DIAGNOSIS — I5042 Chronic combined systolic (congestive) and diastolic (congestive) heart failure: Secondary | ICD-10-CM

## 2022-02-02 DIAGNOSIS — E119 Type 2 diabetes mellitus without complications: Secondary | ICD-10-CM

## 2022-02-02 NOTE — Patient Instructions (Signed)
Visit Information  [name] and [place], thank you for taking time to talk with me today about Nicholas Burton's health care needs. Please don't hesitate to contact me if I can be of assistance to you before our next scheduled telephone appointment  As we discussed today, I have asked the [facility] team to call you on the telephone to talk to you about getting on the [employer] waiting list: please listen out for a call from the care guide  Below are the goals we discussed today:  Patient Self-Care Activities: Patient Nicholas Burton will: Take medications as prescribed Attend all scheduled provider appointments Call pharmacy for medication refills Call provider office for new concerns or questions Continue checking fasting (first thing in the morning, before eating) blood sugars, and then after-eating blood sugars, 2 hours after a regular meal Write down on paper your blood sugars so we can review them together the next time we talk Consider checking your weights at home every day and writing them down on paper or your calendar; if you are not willing to weigh yourself every day, please do so several times a week: overnight weight gain of 3 or more pounds is the earliest indicator that you may be retaining fluid Continue monitoring your blood pressures at home 2 or 3 times a week  Try to take steps to better follow heart healthy, low salt, low cholesterol, carbohydrate-modified, low sugar diet Keep up the good work preventing falls at home- continue to use your cane as needed  Our next scheduled telephone follow up visit/ appointment is scheduled on: Wednesday, May 04, 2022 at 3:00 pm- This is a PHONE CALL appointment  If you need to cancel or re-schedule our visit, please call [phone] and our care guide team will be happy to assist you.   I look forward to hearing about your progress.   [name], RN, BSN, [place] [phone]: direct office  If you are experiencing a Mental Health or [place] or need someone to talk to, please  call the Suicide and Crisis Lifeline: 988 call the [place] National Suicide Prevention Lifeline: 712-123-5559 or TTY: (443)776-2701 TTY 574-307-4760) to talk to a trained counselor call 1-800-273-TALK (toll free, 24 hour hotline) go to [facility] [address], [place] [phone]) call 911   The patient verbalized understanding of instructions, educational materials, and care plan provided today and agreed to receive a mailed copy of patient instructions, educational materials, and care plan  Healthy Eating Following a healthy eating pattern may help you to achieve and maintain a healthy body weight, reduce the risk of chronic disease, and live a long and productive life. It is important to follow a healthy eating pattern at an appropriate calorie level for your body. Your nutritional needs should be met primarily through food by choosing a variety of nutrient-rich foods. What are tips for following this plan? Reading food labels Read labels and choose the following: Reduced or low sodium. Juices with 100% fruit juice. Foods with low saturated fats and high polyunsaturated and monounsaturated fats. Foods with whole grains, such as whole wheat, cracked wheat, brown rice, and wild rice. Whole grains that are fortified with folic acid. This is recommended for women who are pregnant or who want to become pregnant. Read labels and avoid the following: Foods with a lot of added sugars. These include foods that contain brown sugar, corn sweetener,  corn syrup, dextrose, fructose, glucose, high-fructose corn syrup, honey, invert sugar, lactose, malt syrup, maltose, molasses, raw sugar, sucrose, trehalose, or turbinado sugar. Do not eat more than the following amounts of added sugar per day: 6 teaspoons (25 g) for  women. 9 teaspoons (38 g) for men. Foods that contain processed or refined starches and grains. Refined grain products, such as white flour, degermed cornmeal, white bread, and white rice. Shopping Choose nutrient-rich snacks, such as vegetables, whole fruits, and nuts. Avoid high-calorie and high-sugar snacks, such as potato chips, fruit snacks, and candy. Use oil-based dressings and spreads on foods instead of solid fats such as butter, stick margarine, or cream cheese. Limit pre-made sauces, mixes, and "instant" products such as flavored rice, instant noodles, and ready-made pasta. Try more plant-protein sources, such as tofu, tempeh, black beans, edamame, lentils, nuts, and seeds. Explore eating plans such as the Mediterranean diet or vegetarian diet. Cooking Use oil to saut or stir-fry foods instead of solid fats such as butter, stick margarine, or lard. Try baking, boiling, grilling, or broiling instead of frying. Remove the fatty part of meats before cooking. Steam vegetables in water or broth. Meal planning  At meals, imagine dividing your plate into fourths: One-half of your plate is fruits and vegetables. One-fourth of your plate is whole grains. One-fourth of your plate is protein, especially lean meats, poultry, eggs, tofu, beans, or nuts. Include low-fat dairy as part of your daily diet. Lifestyle Choose healthy options in all settings, including home, work, school, restaurants, or stores. Prepare your food safely: Wash your hands after handling raw meats. Keep food preparation surfaces clean by regularly washing with hot, soapy water. Keep raw meats separate from ready-to-eat foods, such as fruits and vegetables. Cook seafood, meat, poultry, and eggs to the recommended internal temperature. Store foods at safe temperatures. In general: Keep cold foods at 53F (4.4C) or below. Keep hot foods at 153F (60C) or above. Keep your freezer at [facility] (-17.8C) or below. Foods  are no longer safe to eat when they have been between the temperatures of 40-153F (4.4-60C) for more than 2 hours. What foods should I eat? Fruits Aim to eat 2 cup-equivalents of fresh, canned (in natural juice), or frozen fruits each day. Examples of 1 cup-equivalent of fruit include 1 small apple, 8 large strawberries, 1 cup canned fruit,  cup dried fruit, or 1 cup 100% juice. Vegetables Aim to eat 2-3 cup-equivalents of fresh and frozen vegetables each day, including different varieties and colors. Examples of 1 cup-equivalent of vegetables include 2 medium carrots, 2 cups raw, leafy greens, 1 cup chopped vegetable (raw or cooked), or 1 medium baked potato. Grains Aim to eat 6 ounce-equivalents of whole grains each day. Examples of 1 ounce-equivalent of grains include 1 slice of bread, 1 cup ready-to-eat cereal, 3 cups popcorn, or  cup cooked rice, pasta, or cereal. Meats and other proteins Aim to eat 5-6 ounce-equivalents of protein each day. Examples of 1 ounce-equivalent of protein include 1 egg, 1/2 cup nuts or seeds, or 1 tablespoon (16 g) peanut butter. A cut of meat or fish that is the size of a deck of cards is about 3-4 ounce-equivalents. Of the protein you eat each week, try to have at least 8 ounces come from seafood. This includes salmon, trout, herring, and anchovies. Dairy Aim to eat 3 cup-equivalents of fat-free or low-fat dairy each day. Examples of 1 cup-equivalent of dairy include 1 cup (240 mL) milk, 8 ounces (250 g) yogurt,  1 ounces (44 g) natural cheese, or 1 cup (240 mL) fortified soy milk. Fats and oils Aim for about 5 teaspoons (21 g) per day. Choose monounsaturated fats, such as canola and olive oils, avocados, peanut butter, and most nuts, or polyunsaturated fats, such as sunflower, corn, and soybean oils, walnuts, pine nuts, sesame seeds, sunflower seeds, and flaxseed. Beverages Aim for six 8-oz glasses of water per day. Limit coffee to three to five 8-oz cups per  day. Limit caffeinated beverages that have added calories, such as soda and energy drinks. Limit alcohol intake to no more than 1 drink a day for nonpregnant women and 2 drinks a day for men. One drink equals 12 oz of beer (355 mL), 5 oz of wine (148 mL), or 1 oz of hard liquor (44 mL). Seasoning and other foods Avoid adding excess amounts of salt to your foods. Try flavoring foods with herbs and spices instead of salt. Avoid adding sugar to foods. Try using oil-based dressings, sauces, and spreads instead of solid fats. This information is based on general [place] nutrition guidelines. For more information, visit [employer]. Exact amounts may vary based on your nutrition needs. Summary A healthy eating plan may help you to maintain a healthy weight, reduce the risk of chronic diseases, and stay active throughout your life. Plan your meals. Make sure you eat the right portions of a variety of nutrient-rich foods. Try baking, boiling, grilling, or broiling instead of frying. Choose healthy options in all settings, including home, work, school, restaurants, or stores. This information is not intended to replace advice given to you by your health care provider. Make sure you discuss any questions you have with your health care provider. Document Revised: 04/13/2021 Document Reviewed: 04/13/2021 Elsevier Patient Education  [employer].

## 2022-02-02 NOTE — Chronic Care Management (AMB) (Signed)
Chronic Care Management   CCM RN Visit Note  02/02/2022 Name: Nicholas Burton MRN: [id number] DOB: 10-Feb-1928  Subjective: Nicholas Burton is a 88 y.o. year old male who is a primary care patient of Nicholas Lima, MD. The care management team was consulted for assistance with disease management and care coordination needs.    Engaged with patient and his spouse/ caregiver Nicholas Burton on [place] by telephone for follow up visit in response to provider referral for case management and/or care coordination services.   Consent to Services:  The patient was given information about Chronic Care Management services, agreed to services, and gave verbal consent prior to initiation of services.  Please see initial visit note for detailed documentation.  Patient agreed to services and verbal consent obtained.   Assessment: Review of patient past medical history, allergies, medications, health status, including review of consultants reports, laboratory and other test data, was performed as part of comprehensive evaluation and provision of chronic care management services.   SDOH (Social Determinants of Health) assessments and interventions performed:  SDOH Interventions    Flowsheet Row Most Recent Value  SDOH Interventions   Food Insecurity Interventions Intervention Not Indicated, Other (Comment)  [spouse denies true food insecurity, but states that PCP has recommended that she and patient look into getting on Meals on Wheels wait list: [facility] referral placed accordingly]  Housing Interventions Intervention Not Indicated  [continues to reside in single family home with spouse only,  family members live close by and check in daily]  Transportation Interventions Intervention Not Indicated  [family continues to provide transportation,  wife occasionally drives Nicholas Burton distances as/ if indicated]     CCM Care Plan  Allergies  Allergen Reactions   Ace Inhibitors     REACTION: cough    Oxycodone-Acetaminophen    Penicillins    Outpatient Encounter Medications as of 02/02/2022  Medication Sig Note   atorvastatin (LIPITOR) 10 MG tablet Take 1 tablet (10 mg total) by mouth daily.    B-D ULTRAFINE III Man PEN 31G X 8 MM MISC USE TO INJECT INSULIN DAILY    Blood Glucose Monitoring Suppl (FREESTYLE FREEDOM LITE) W/DEVICE KIT Use to check blood sugars daily Dx E11.8    cholecalciferol (VITAMIN D) 1000 units tablet Take 1,000 Units by mouth daily.    Colesevelam HCl 3.75 g PACK Take 1 packet by mouth daily.    Continuous Blood Gluc Receiver (FREESTYLE LIBRE 2 READER) DEVI 1 Act by Does not apply route daily.    Continuous Blood Gluc Sensor (FREESTYLE LIBRE 2 SENSOR) MISC 1 Act by Does not apply route daily.    Dietary Management Product (VASCULERA) TABS Take 1 tablet by mouth daily.    FREESTYLE LITE test strip USE AS INSTRUCTED TO CHECK BLOOD SUGAR TWICE A DAY    GEMTESA 75 MG TABS TAKE 1 TABLET DAILY    Glucagon (GVOKE HYPOPEN 2-PACK) 1 MG/0.2ML SOAJ Inject 1 Act into the skin daily as needed.    Lancets (FREESTYLE) lancets Use to check blood sugars twice day Dx E11.8    LANTUS SOLOSTAR 100 UNIT/ML Solostar Pen INJECT 0.3ML (30 UNITS TOTAL) INTO THE SKIN AT BEDTIME 12/27/2021: 12/27/21- spouse/ caregiver reports patient now taking 20 U QD in morning, per PCP instructions 12/13/21   losartan (COZAAR) 25 MG tablet Take 1 tablet (25 mg total) by mouth daily.    metFORMIN (GLUCOPHAGE) 500 MG tablet TAKE 1 TABLET TWICE A DAY WITH MEALS  metoprolol tartrate (LOPRESSOR) 25 MG tablet TAKE ONE-HALF (1/2) TABLET BY MOUTH TWICE A DAY    Multiple Vitamins-Minerals (PRESERVISION/LUTEIN) CAPS Take 1 capsule by mouth 2 (two) times daily.    torsemide (DEMADEX) 10 MG tablet Take 1 tablet (10 mg total) by mouth daily.    vitamin B-12 (CYANOCOBALAMIN) 500 MCG tablet Take 500 mcg by mouth daily.    No facility-administered encounter medications on file as of 02/02/2022.   Patient Active Problem  List   Diagnosis Date Noted   Left-sided epistaxis 12/28/2021   Hearing loss secondary to cerumen impaction, left 09/21/2020   Late onset Alzheimer's dementia without behavioral disturbance ([place]) 09/21/2020   Iron deficiency anemia secondary to inadequate dietary iron intake 06/06/2019   Chronic combined systolic and diastolic congestive heart failure ([place]) 10/30/2018   PSA elevation 10/25/2018   CRI (chronic renal insufficiency), stage 3 (moderate) 10/25/2018   Venous stasis dermatitis of both lower extremities 01/29/2018   COPD with asthma ([place]) 03/02/2017   Obesity (BMI 30.0-34.9) 04/01/2013   BPH (benign prostatic hyperplasia) 09/08/2011   B12 deficiency 08/13/2010   Type II diabetes mellitus with manifestations ([place]) 11/10/2008   Vitamin D deficiency 11/10/2008   Hyperlipidemia with target LDL less than 100 11/10/2008   Essential hypertension 11/10/2008   Osteoarthritis 11/10/2008   Conditions to be addressed/monitored:  CHF, HTN, and DMII  Care Plan : RN Care Manager Plan of Care  Updates made by Nicholas Royalty, RN since 02/02/2022 12:00 AM     Problem: Chronic Disease Management Needs   Priority: High     Long-Range Goal: Ongoing adherence to established plan of care for long term chronic disease management   Start Date: 07/21/2021  Expected End Date: 07/21/2022  Priority: High  Note:   Current Barriers:  Chronic Disease Management support and education needs related to CHF and DMII Hard of hearing: bilateral hearing aids  12/27/21: spouse reports patient will be obtaining new hearing aids through [place] "soon" Vision concerns: macular degeneration; gets regular retinal injections 12/27/21: ongoing lack of motivation/ resistance to following/ adhering to plan of care: spouse reports she "tries to stay on him;" however, "he doesn't really listen to me"  RNCM Clinical Goal(s):  Patient will demonstrate ongoing health management independence as evidenced by adherence to plan  of care for DMII, CHF        through collaboration with RN Care manager, provider, and care team.   Interventions: 1:1 collaboration with primary care provider regarding development and update of comprehensive plan of care as evidenced by provider attestation and co-signature Inter-disciplinary care team collaboration (see longitudinal plan of care) Evaluation of current treatment plan related to  self management and patient's adherence to plan as established by provider Review of patient status, including review of consultants reports, relevant laboratory and other test results, and medications completed SDOH updated: no new/ unmet concerns identified: patient and spouse deny true food insecurity, however report that their PCP has suggested the benefit of having Meals on Wheels due to their age; they understand there is a wait list, but would like information: will place [place] referral Pain assessment updated: denies pain today; reports occasional/ intermittent bilateral lower extremity pain Falls assessment updated: continues to deny new/ recent falls since November 2022; confirms patient continues using cane regularly;  positive reinforcement provided with encouragement to continue efforts at fall prevention; previously provided education around fall risks/ prevention reinforced Reviewed upcoming scheduled provider appointments: none;  Discussed plans with patient for  ongoing care management follow up and provided patient with direct contact information for care management team     Heart Failure Interventions:  (Status: 02/02/22: Goal on Track (progressing): YES.)  Long Term Goal  Basic overview and discussion of pathophysiology of Heart Failure reviewed Discussed importance of daily weight and advised patient to weigh and record daily Discussed the importance of keeping all appointments with provider spouse reports patient has continued occasionally monitoring/ recording  weights at home: again reports weighing "once or twice a week;" reinforced previously provided education ideally around monitoring weight daily, spouse again reports patient "just will not do it," even though she has offered to assist and read the scales for him; reinforced education around early vs. late signs/ symptoms of fluid retention; spouse states she will continue to encourage patient to weigh himself more often, as she has been-- patient will benefit from ongoing education, support, reinforcement, and reminding, given he is not monitoring regularly Patient denies shortness of breath or swelling outside of baseline today Patient confirms he is continuing to take diuretic as prescribed Spouse reports she has continued monitoring patient's blood pressures at home regularly; she continues to report blood pressures are "all great, they are completely normal;" reports blood pressure this morning of "128/78" Spouse re-confirms patient not consistently following heart healthy, diabetes diet; reports again, "we try to limit salt"-- reinforced previously provided education around importance of adherence to prescribed heart healthy, low salt, low cholesterol diet  Diabetes:  (Status: 02/02/22: Goal on Track (progressing): YES.) Long Term Goal   Lab Results  Component Value Date   HGBA1C 7.4 (H) 12/28/2021  Reviewed prescribed diet with patient heart healthy, low sodium, carbohydrate modified, low sugar; Counseled on importance of regular laboratory monitoring as prescribed;        Confirmed patient obtained and is still using CGM per [facility]- wife states they "have learned how to use it" and "it's going well Assessed patient's understanding of meaning/ significance of A1-C values: good baseline understanding of same- Reviewed individual historical A1-C trends and provided education around correlation of A1-C value to blood sugar levels at home over 3 months-- reviewed his most recent/ updated A1-C results;  positive reinforcement provided in maintaining good blood sugar control over last few months Reviewed recent fasting blood sugars: she reports "all are in the 90's or low-to mid 100's;" they are not monitoring post-prandial values except "immediately after a meal;" at which time they report values "as high as 300;" explained that this is normal immediately after eating, and again encouraged patient to think about starting to monitor 2 hours after a meal Confirmed patient continues taking insulin at 20 U QD, and reports no recent low blood sugar values at home recently Spouse reports patient has tried recently to be more adherent to following prescribed diet: reinforced previously provided education/ discussed benefit of making small changes to diet for lasting change  Patient Goals/Self-Care Activities: As evidenced by review of EHR, collaboration with care team, and patient reporting during CCM RN CM outreach,  Patient Akshay will: Take medications as prescribed Attend all scheduled provider appointments Call pharmacy for medication refills Call provider office for new concerns or questions Continue checking fasting (first thing in the morning, before eating) blood sugars, and then after-eating blood sugars, 2 hours after a regular meal Write down on paper your blood sugars so we can review them together the next time we talk Consider checking your weights at home every day and writing them down on paper  or your calendar; if you are not willing to weigh yourself every day, please do so several times a week: overnight weight gain of 3 or more pounds is the earliest indicator that you may be retaining fluid Continue monitoring your blood pressures at home 2 or 3 times a week  Try to take steps to better follow heart healthy, low salt, low cholesterol, carbohydrate-modified, low sugar diet Keep up the good work preventing falls at home- continue to use your cane as needed     Plan: Telephone follow  up appointment with care management team member scheduled for:  Wednesday, May 04, 2022 at 3:00 pm The patient has been provided with contact information for the care management team and has been advised to call with any health related questions or concerns   [name], RN, BSN, [place] [phone]: direct office

## 2022-02-03 ENCOUNTER — Telehealth: Payer: Self-pay

## 2022-02-03 NOTE — Telephone Encounter (Signed)
   Telephone encounter was:  Successful.  02/03/2022 Name: TOBECHUKWU EMMICK MRN: [id number] DOB: Jan 22, 1928  [name] is a 88 y.o. year old male who is a primary care patient of [name], MD . The community resource team was consulted for assistance with [place] guide performed the following interventions: Patient provided with information about care guide support team and interviewed to confirm resource needs.Patient stated they want to see if they can be put on [facility] waiting list  Follow Up Plan:  Care guide will follow up with patient by phone over the next day    [facility], Care Management  [phone] [address], [place], [place] [address] Phone: [phone] Email: [email or web address]

## 2022-02-04 ENCOUNTER — Telehealth: Payer: Self-pay

## 2022-02-04 NOTE — Telephone Encounter (Signed)
   Telephone encounter was:  Successful.  02/04/2022 Name: Nicholas Burton MRN: [id number] DOB: Apr 13, 1928  Nicholas Burton is a 88 y.o. year old male who is a primary care patient of [name], MD . The community resource team was consulted for assistance with  meals on wheels  Care guide performed the following interventions: Patient provided with information about care guide support team and interviewed to confirm resource needs. Patient and wife is on the wait list for meals on wheels up to a 6 month wait list or longer  Follow Up Plan:  No further follow up planned at this time. The patient has been provided with needed resources.    [facility], Care Management  [phone] [address], [place], [place] [address] Phone: [phone] Email: [email or web address]

## 2022-02-15 ENCOUNTER — Telehealth: Payer: Medicare Other

## 2022-02-23 DIAGNOSIS — H3540 Unspecified peripheral retinal degeneration: Secondary | ICD-10-CM | POA: Diagnosis not present

## 2022-02-23 DIAGNOSIS — H43813 Vitreous degeneration, bilateral: Secondary | ICD-10-CM | POA: Diagnosis not present

## 2022-02-23 DIAGNOSIS — H353223 Exudative age-related macular degeneration, left eye, with inactive scar: Secondary | ICD-10-CM | POA: Diagnosis not present

## 2022-02-23 DIAGNOSIS — H353211 Exudative age-related macular degeneration, right eye, with active choroidal neovascularization: Secondary | ICD-10-CM | POA: Diagnosis not present

## 2022-02-25 DIAGNOSIS — E1159 Type 2 diabetes mellitus with other circulatory complications: Secondary | ICD-10-CM

## 2022-02-25 DIAGNOSIS — Z794 Long term (current) use of insulin: Secondary | ICD-10-CM | POA: Diagnosis not present

## 2022-02-25 DIAGNOSIS — I509 Heart failure, unspecified: Secondary | ICD-10-CM | POA: Diagnosis not present

## 2022-04-01 ENCOUNTER — Ambulatory Visit: Payer: Medicare Other | Admitting: *Deleted

## 2022-04-01 DIAGNOSIS — I5042 Chronic combined systolic (congestive) and diastolic (congestive) heart failure: Secondary | ICD-10-CM

## 2022-04-01 DIAGNOSIS — E119 Type 2 diabetes mellitus without complications: Secondary | ICD-10-CM

## 2022-04-01 IMAGING — DX DG CHEST 2V
2 series · 2 of 2 positions shown · non-contrast
Comparison: 05/16/2019

CLINICAL DATA: Cough

EXAM:
CHEST - 2 VIEW

[chest pa]
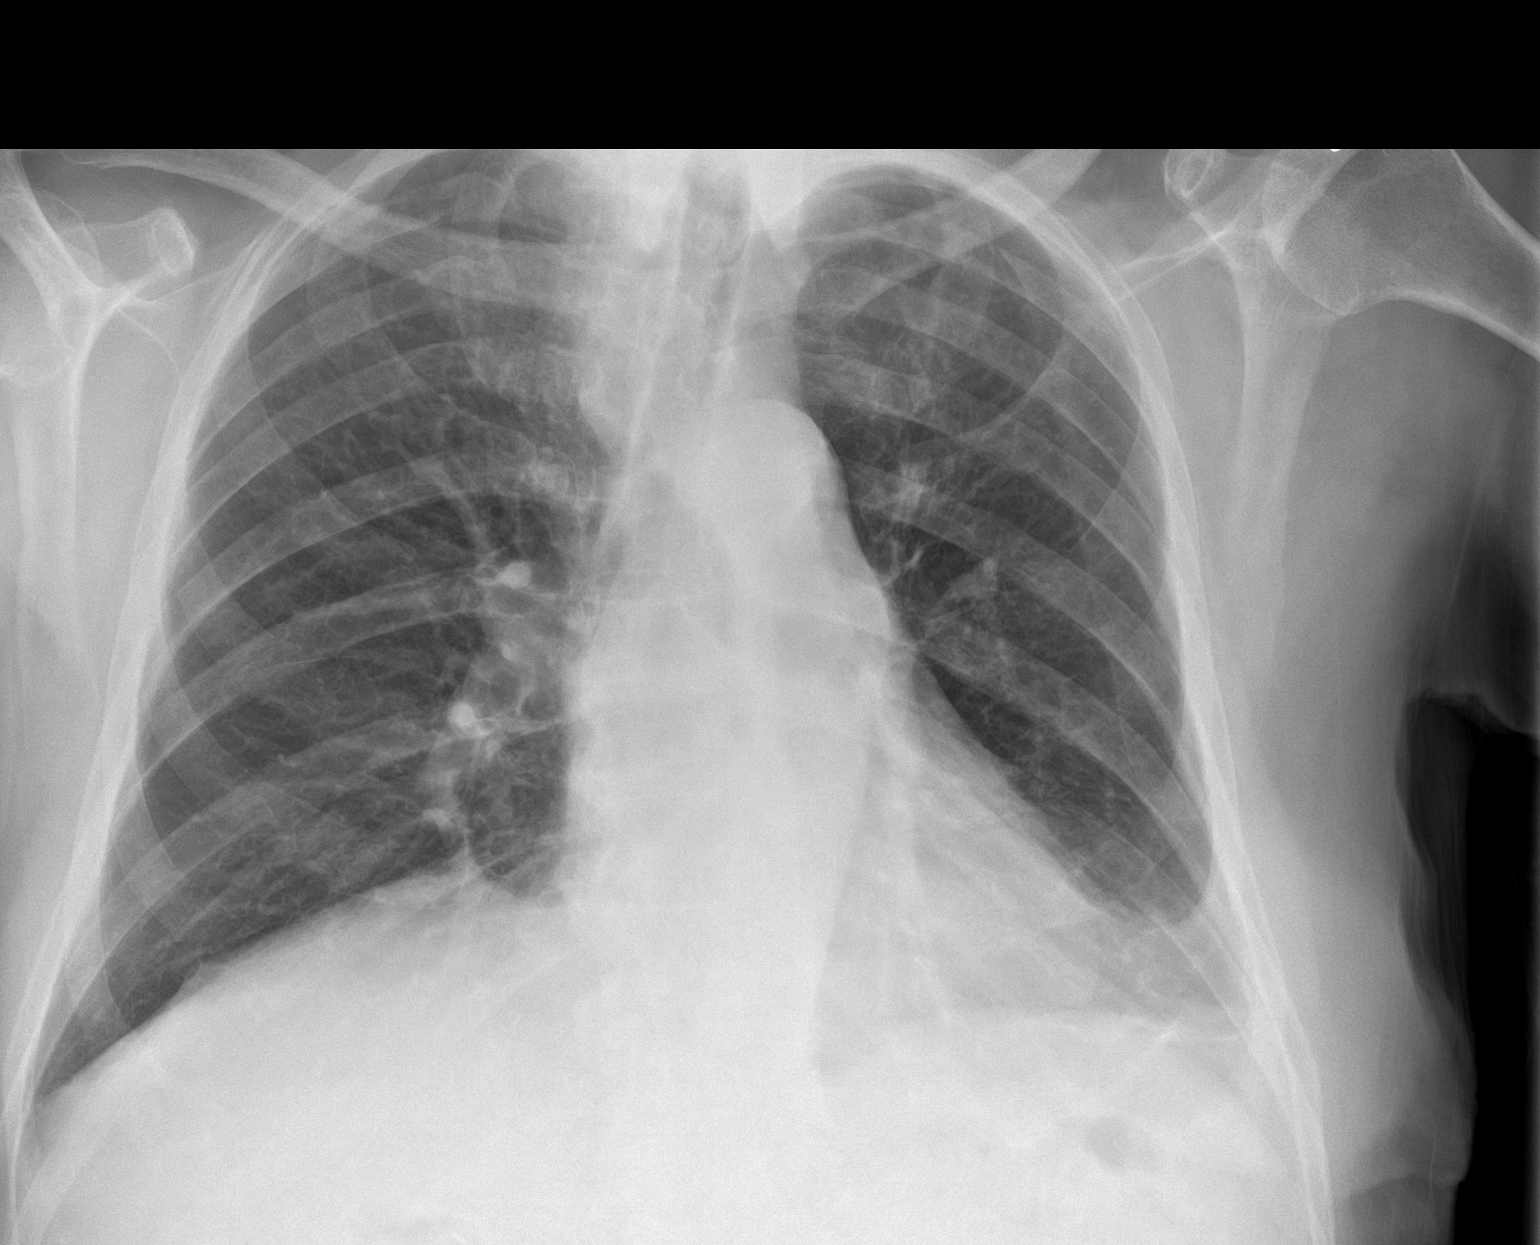

[chest lat]
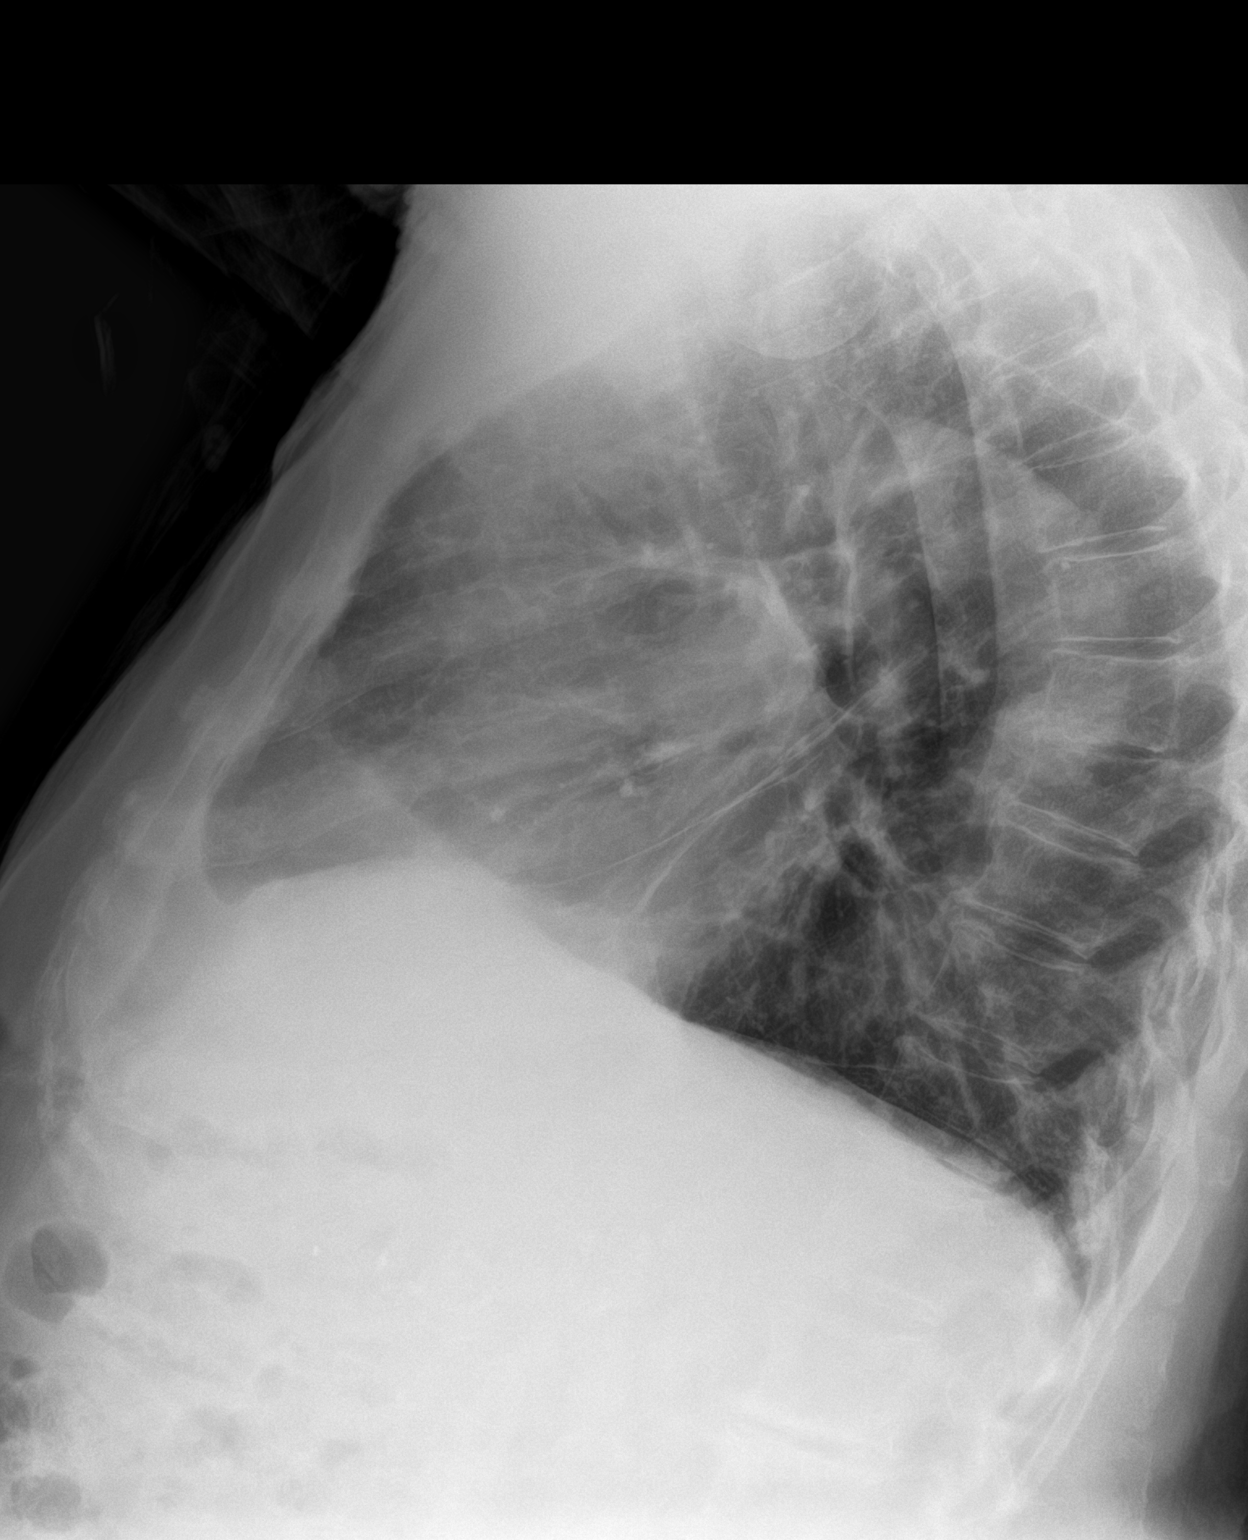

[2 of 2 positions shown; findings below may reference images not displayed]

FINDINGS: Heart is normal size. Scarring in the lingula/left base, stable. No
confluent opacities or effusions. No acute bony abnormality.
IMPRESSION: No active cardiopulmonary disease.

## 2022-04-01 NOTE — Chronic Care Management (AMB) (Signed)
Care Management    RN Visit Note  04/01/2022 Name: Nicholas Burton MRN: [id number] DOB: November 21, 1927  Subjective: Nicholas Burton is a 88 y.o. year old male who is a primary care patient of Nicholas Grandchild, MD. The care management team was consulted for assistance with disease management and care coordination needs.    Engaged with patient and his spouse/ caregiver, Nicholas Burton on [facility] DPR by telephone for follow up visit/ RN CM case closure in response to provider referral for case management and/or care coordination services.   Consent to Services:   Nicholas Burton was given information about Care Management services 07/13/21 including:  Care Management services includes personalized support from designated clinical staff supervised by his physician, including individualized plan of care and coordination with other care providers 24/7 contact phone numbers for assistance for urgent and routine care needs. The patient may stop case management services at any time by phone call to the office staff.  Patient agreed to services and consent obtained.   Assessment: Review of patient past medical history, allergies, medications, health status, including review of consultants reports, laboratory and other test data, was performed as part of comprehensive evaluation and provision of chronic care management services.   SDOH (Social Determinants of Health) assessments and interventions performed:  SDOH Interventions    Flowsheet Row Most Recent Value  SDOH Interventions   Food Insecurity Interventions Intervention Not Indicated  [confirms was provided resources for meals on Wheels and is now on wait list,  denies food insecurity]  Transportation Interventions Intervention Not Indicated  [reports family continues to provide transportation]     Care Plan  Allergies  Allergen Reactions   Ace Inhibitors     REACTION: cough   Oxycodone-Acetaminophen    Penicillins    Outpatient Encounter Medications as of  04/01/2022  Medication Sig Note   atorvastatin (LIPITOR) 10 MG tablet Take 1 tablet (10 mg total) by mouth daily.    B-D ULTRAFINE III [place] PEN 31G X 8 MM MISC USE TO INJECT INSULIN DAILY    Blood Glucose Monitoring Suppl (FREESTYLE FREEDOM LITE) W/DEVICE KIT Use to check blood sugars daily Dx E11.8    cholecalciferol (VITAMIN D) 1000 units tablet Take 1,000 Units by mouth daily.    Colesevelam HCl 3.75 g PACK Take 1 packet by mouth daily.    Continuous Blood Gluc Receiver (FREESTYLE LIBRE 2 READER) DEVI 1 Act by Does not apply route daily.    Continuous Blood Gluc Sensor (FREESTYLE LIBRE 2 SENSOR) MISC 1 Act by Does not apply route daily.    Dietary Management Product (VASCULERA) TABS Take 1 tablet by mouth daily.    FREESTYLE LITE test strip USE AS INSTRUCTED TO CHECK BLOOD SUGAR TWICE A DAY    GEMTESA 75 MG TABS TAKE 1 TABLET DAILY    Glucagon (GVOKE HYPOPEN 2-PACK) 1 MG/0.2ML SOAJ Inject 1 Act into the skin daily as needed.    Lancets (FREESTYLE) lancets Use to check blood sugars twice day Dx E11.8    LANTUS SOLOSTAR 100 UNIT/ML Solostar Pen INJECT 0.3ML (30 UNITS TOTAL) INTO THE SKIN AT BEDTIME 12/27/2021: 12/27/21- spouse/ caregiver reports patient now taking 20 U QD in morning, per PCP instructions 12/13/21   losartan (COZAAR) 25 MG tablet Take 1 tablet (25 mg total) by mouth daily.    metFORMIN (GLUCOPHAGE) 500 MG tablet TAKE 1 TABLET TWICE A DAY WITH MEALS    metoprolol tartrate (LOPRESSOR) 25 MG tablet TAKE ONE-HALF (1/2) TABLET BY MOUTH  TWICE A DAY    Multiple Vitamins-Minerals (PRESERVISION/LUTEIN) CAPS Take 1 capsule by mouth 2 (two) times daily.    torsemide (DEMADEX) 10 MG tablet Take 1 tablet (10 mg total) by mouth daily.    vitamin B-12 (CYANOCOBALAMIN) 500 MCG tablet Take 500 mcg by mouth daily.    No facility-administered encounter medications on file as of 04/01/2022.   Patient Active Problem List   Diagnosis Date Noted   Left-sided epistaxis 12/28/2021   Hearing loss  secondary to cerumen impaction, left 09/21/2020   Late onset Alzheimer's dementia without behavioral disturbance ([name]) 09/21/2020   Iron deficiency anemia secondary to inadequate dietary iron intake 06/06/2019   Chronic combined systolic and diastolic congestive heart failure ([name]) 10/30/2018   PSA elevation 10/25/2018   CRI (chronic renal insufficiency), stage 3 (moderate) 10/25/2018   Venous stasis dermatitis of both lower extremities 01/29/2018   COPD with asthma ([name]) 03/02/2017   Obesity (BMI 30.0-34.9) 04/01/2013   BPH (benign prostatic hyperplasia) 09/08/2011   B12 deficiency 08/13/2010   Type II diabetes mellitus with manifestations ([name]) 11/10/2008   Vitamin D deficiency 11/10/2008   Hyperlipidemia with target LDL less than 100 11/10/2008   Essential hypertension 11/10/2008   Osteoarthritis 11/10/2008   Conditions to be addressed/monitored: CHF and DMII  Care Plan : RN Care Manager Plan of Care  Updates made by Knox Royalty, RN since 04/01/2022 12:00 AM     Problem: Chronic Disease Management Needs   Priority: High     Long-Range Goal: Ongoing adherence to established plan of care for long term chronic disease management   Start Date: 07/21/2021  Expected End Date: 07/21/2022  Priority: High  Note:   Current Barriers:  Chronic Disease Management support and education needs related to CHF and DMII Hard of hearing: bilateral hearing aids  12/27/21: spouse reports patient will be obtaining new hearing aids through [place] "soon" Vision concerns: macular degeneration; gets regular retinal injections 12/27/21: ongoing lack of motivation/ resistance to following/ adhering to plan of care: spouse reports she "tries to stay on him;" however, "he doesn't really listen to me"  RNCM Clinical Goal(s):  Patient will demonstrate ongoing health management independence as evidenced by adherence to plan of care for DMII, CHF        through collaboration with RN Care manager, provider, and  care team.   Interventions: 1:1 collaboration with primary care provider regarding development and update of comprehensive plan of care as evidenced by provider attestation and co-signature Inter-disciplinary care team collaboration (see longitudinal plan of care) Evaluation of current treatment plan related to  self management and patient's adherence to plan as established by provider Review of patient status, including review of consultants reports, relevant laboratory and other test results, and medications completed SDOH updated: no new/ unmet concerns identified: patient and spouse continue to deny true food insecurity, and confirm they spoke with [place] and have now been placed on Meals on Wheels waiting list; verbalize understanding that the wait list may be 6 months out- they are fine with that Pain assessment updated: denies pain today Falls assessment updated: continues to deny new/ recent falls since November 2022; confirms patient continues using cane regularly;  positive reinforcement provided with encouragement to continue efforts at fall prevention; previously provided education around fall risks/ prevention reinforced-- of note, patient and spouse just took a family trip to [place] and report "they got tired" but "didn't have any falls or any problems" Reviewed upcoming scheduled provider appointments: none;  reviewed last PCP office visit (March 2023)- reviewed recommendation to schedule follow up visit in 6 months; encouraged then to schedule as it is due at end of August/ first of September Discussed plans with patient for ongoing care management follow up- patient denies current care coordination/ care management needs and is agreeable to CCM RN CM case closure today; verbalizes understanding to contact PCP or other care providers for any needs that arise in the future, and confirms they have contact information for all care providers     Heart Failure  Interventions:  (Status: 04/01/22: Goal Met.)  Long Term Goal  Basic overview and discussion of pathophysiology of Heart Failure reviewed Discussed importance of daily weight and advised patient to weigh and record daily Discussed the importance of keeping all appointments with provider Screening for signs and symptoms of depression related to chronic disease state  spouse reports patient has continued occasionally monitoring/ recording weights at home: again reports weighing "once or twice a week;" reinforced previously provided education ideally around monitoring weight daily, spouse again reports patient "just will not do it;" reinforced education around early vs. late signs/ symptoms of fluid retention- patient and spouse deny signs/ symptoms today and report patient "breathing seems just fine, normal" Patient confirms he is continuing to take diuretic as prescribed Spouse reports they have been taking a break in monitoring blood pressures at home regularly with recent travel; she states she plans to resume soon and this was encouraged Spouse re-confirms patient not consistently following heart healthy, diabetes diet; again reports they limit adding salt to their food; reinforced previously provided education around importance of adherence to prescribed heart healthy, low salt, low cholesterol diet  Diabetes:  (Status: 04/01/22: Goal Met.) Long Term Goal   Lab Results  Component Value Date   HGBA1C 7.4 (H) 12/28/2021  Reviewed prescribed diet with patient heart healthy, low sodium, carbohydrate modified, low sugar; Counseled on importance of regular laboratory monitoring as prescribed;        Confirmed patient obtained and is still using CGM per [place] [facility]- monitoring blood sugars throughout the day, every day Reviewed recent fasting blood sugars: she reports "all are in the 90's or low-to mid 100's;" they are not monitoring post-prandial values except "immediately after a meal;" at which time they  report values "as high as 300;" explained that this is normal immediately after eating, and again encouraged patient to think about starting to monitor 2 hours after a meal Confirmed patient continues taking insulin at 20 U QD, and reports no recent low blood sugar values at home recently Spouse reports patient has tried recently to be more adherent to following prescribed diet: reinforced previously provided education/ discussed benefit of making small changes to diet for lasting change     Plan:  No further follow up required: patient/ spouse deny current care coordination/ care management needs and is agreeable to RN CM case closure today; RN CM case closure accordingly     [name], RN, BSN, [place] [phone]: direct office

## 2022-04-06 ENCOUNTER — Telehealth: Payer: Self-pay | Admitting: Internal Medicine

## 2022-04-06 ENCOUNTER — Other Ambulatory Visit: Payer: Self-pay | Admitting: Internal Medicine

## 2022-04-06 DIAGNOSIS — I872 Venous insufficiency (chronic) (peripheral): Secondary | ICD-10-CM

## 2022-04-06 MED ORDER — VASCULERA PO TABS: 1.0000 | ORAL_TABLET | Freq: Every day | ORAL | 11 refills | Status: DC

## 2022-04-06 NOTE — Telephone Encounter (Signed)
Is this ok to send...Nicholas Burton

## 2022-04-06 NOTE — Telephone Encounter (Signed)
Caller & Relationship to patient: Fritz Pickerel. Spouse  Call back number: [phone]  Date of last office visit: 12/28/21  Date of next office visit:   Medication(s) to be refilled:  Dietary Management Product Lacretia Nicks) TABS   Preferred Pharmacy:  [facility], [place], [address] Phone:  [phone]  Fax:  [phone]

## 2022-04-25 ENCOUNTER — Ambulatory Visit (INDEPENDENT_AMBULATORY_CARE_PROVIDER_SITE_OTHER): Payer: Medicare Other | Admitting: Internal Medicine

## 2022-04-25 ENCOUNTER — Encounter: Payer: Self-pay | Admitting: Internal Medicine

## 2022-04-25 VITALS — BP 116/60 | HR 71 | Temp 97.7°F | Ht 69.0 in | Wt 189.0 lb

## 2022-04-25 DIAGNOSIS — H6120 Impacted cerumen, unspecified ear: Secondary | ICD-10-CM | POA: Insufficient documentation

## 2022-04-25 DIAGNOSIS — H9193 Unspecified hearing loss, bilateral: Secondary | ICD-10-CM | POA: Diagnosis not present

## 2022-04-25 DIAGNOSIS — H6123 Impacted cerumen, bilateral: Secondary | ICD-10-CM | POA: Diagnosis not present

## 2022-04-25 DIAGNOSIS — H9190 Unspecified hearing loss, unspecified ear: Secondary | ICD-10-CM | POA: Insufficient documentation

## 2022-04-25 DIAGNOSIS — H6121 Impacted cerumen, right ear: Secondary | ICD-10-CM | POA: Insufficient documentation

## 2022-04-25 NOTE — Progress Notes (Signed)
Subjective:  Patient ID: Nicholas Burton, male    DOB: 12/30/1927  Age: 88 y.o. MRN: [id number]  CC: Cerumen Impaction (Wife states husband can't hear)   HPI HARU SHAFF presents for hearing loss B.  He is here with his wife.  He has a pair of hearing aids  Outpatient Medications Prior to Visit  Medication Sig Dispense Refill   atorvastatin (LIPITOR) 10 MG tablet Take 1 tablet (10 mg total) by mouth daily. 90 tablet 3   B-D ULTRAFINE III [place] PEN 31G X 8 MM MISC USE TO INJECT INSULIN DAILY 300 each 3   Blood Glucose Monitoring Suppl (FREESTYLE FREEDOM LITE) W/DEVICE KIT Use to check blood sugars daily Dx E11.8 1 each 0   cholecalciferol (VITAMIN D) 1000 units tablet Take 1,000 Units by mouth daily.     Colesevelam HCl 3.75 g PACK Take 1 packet by mouth daily. 90 packet 1   Continuous Blood Gluc Receiver (FREESTYLE LIBRE 2 READER) DEVI 1 Act by Does not apply route daily. 2 each 5   Continuous Blood Gluc Sensor (FREESTYLE LIBRE 2 SENSOR) MISC 1 Act by Does not apply route daily. 2 each 5   Dietary Management Product (VASCULERA) TABS Take 1 tablet by mouth daily. 30 tablet 11   FREESTYLE LITE test strip USE AS INSTRUCTED TO CHECK BLOOD SUGAR TWICE A DAY 200 strip 3   GEMTESA 75 MG TABS TAKE 1 TABLET DAILY 90 tablet 1   Glucagon (GVOKE HYPOPEN 2-PACK) 1 MG/0.2ML SOAJ Inject 1 Act into the skin daily as needed. 2 mL 6   Lancets (FREESTYLE) lancets Use to check blood sugars twice day Dx E11.8 180 each 3   LANTUS SOLOSTAR 100 UNIT/ML Solostar Pen INJECT 0.3ML (30 UNITS TOTAL) INTO THE SKIN AT BEDTIME 27 mL 1   losartan (COZAAR) 25 MG tablet Take 1 tablet (25 mg total) by mouth daily. 90 tablet 3   metFORMIN (GLUCOPHAGE) 500 MG tablet TAKE 1 TABLET TWICE A DAY WITH MEALS 180 tablet 1   metoprolol tartrate (LOPRESSOR) 25 MG tablet TAKE ONE-HALF (1/2) TABLET BY MOUTH TWICE A DAY 90 tablet 3   Multiple Vitamins-Minerals (PRESERVISION/LUTEIN) CAPS Take 1 capsule by mouth 2 (two) times daily.      torsemide (DEMADEX) 10 MG tablet Take 1 tablet (10 mg total) by mouth daily. 90 tablet 3   vitamin B-12 (CYANOCOBALAMIN) 500 MCG tablet Take 500 mcg by mouth daily.     No facility-administered medications prior to visit.    ROS: Review of Systems  Constitutional:  Negative for fever.  HENT:  Positive for hearing loss. Negative for nosebleeds, sore throat, tinnitus, trouble swallowing and voice change.   Psychiatric/Behavioral:  Positive for decreased concentration.     Objective:  BP 116/60 (BP Location: Right Arm)   Pulse 71   Temp 97.7 F (36.5 C) (Oral)   Ht $R'5\' 9"'Fu$  (1.753 m)   Wt 189 lb (85.7 kg)   SpO2 94%   BMI 27.91 kg/m   BP Readings from Last 3 Encounters:  04/25/22 116/60  12/28/21 138/84  10/26/21 140/78    Wt Readings from Last 3 Encounters:  04/25/22 189 lb (85.7 kg)  12/28/21 195 lb (88.5 kg)  10/26/21 195 lb (88.5 kg)    Physical Exam Constitutional:      General: He is not in acute distress.    Appearance: He is well-developed.     Comments: NAD  HENT:     Right Ear: There is impacted  cerumen.     Left Ear: There is impacted cerumen.  Eyes:     Conjunctiva/sclera: Conjunctivae normal.     Pupils: Pupils are equal, round, and reactive to light.  Neck:     Thyroid: No thyromegaly.     Vascular: No JVD.  Cardiovascular:     Rate and Rhythm: Normal rate and regular rhythm.     Heart sounds: Normal heart sounds. No murmur heard.    No friction rub. No gallop.  Pulmonary:     Effort: Pulmonary effort is normal. No respiratory distress.     Breath sounds: Normal breath sounds. No wheezing or rales.  Chest:     Chest wall: No tenderness.  Abdominal:     General: Bowel sounds are normal. There is no distension.     Palpations: Abdomen is soft. There is no mass.     Tenderness: There is no abdominal tenderness. There is no guarding or rebound.  Musculoskeletal:        General: No tenderness. Normal range of motion.     Cervical back: Normal range  of motion.  Lymphadenopathy:     Cervical: No cervical adenopathy.  Skin:    General: Skin is warm and dry.     Findings: No rash.  Neurological:     Mental Status: He is alert and oriented to person, place, and time.     Cranial Nerves: No cranial nerve deficit.     Motor: No abnormal muscle tone.     Coordination: Coordination normal.     Gait: Gait normal.     Deep Tendon Reflexes: Reflexes are normal and symmetric.  Psychiatric:        Behavior: Behavior normal.        Thought Content: Thought content normal.        Judgment: Judgment normal.       Procedure Note :     Procedure :  Ear irrigation right and left ears   Indication:  Cerumen impaction right and left ears, R>L   Risks, including pain, dizziness, eardrum perforation, bleeding, infection and others as well as benefits were explained to the patient in detail. Verbal consent was obtained and the patient agreed to proceed.    We used "The Elephant Ear Irrigation Device" filled with lukewarm water for irrigation. A large amount wax was recovered from both ears. Procedure has also required manual wax removal/instrumentation with an ear wax curette and ear forceps on the right and left ears.   Tolerated well. Complications: None.   Postprocedure instructions :  Call if problems.   Lab Results  Component Value Date   WBC 11.7 (H) 12/28/2021   HGB 12.2 (L) 12/28/2021   HCT 37.0 (L) 12/28/2021   PLT 229.0 12/28/2021   GLUCOSE 95 10/26/2021   CHOL 83 09/17/2019   TRIG 136.0 09/17/2019   HDL 36.40 (L) 09/17/2019   LDLCALC 19 09/17/2019   ALT 13 09/17/2019   AST 17 09/17/2019   NA 140 10/26/2021   K 4.1 10/26/2021   CL 104 10/26/2021   CREATININE 1.58 (H) 10/26/2021   BUN 25 (H) 10/26/2021   CO2 29 10/26/2021   TSH 4.25 09/17/2019   PSA 11.21 (H) 10/30/2017   INR 0.9 12/28/2021   HGBA1C 7.4 (H) 12/28/2021   MICROALBUR 0.7 09/17/2019    DG Chest 2 View  Result Date: 05/16/2019 CLINICAL DATA:  Cough  and wheezing for 2 days. EXAM: CHEST - 2 VIEW COMPARISON:  02/05/2016. FINDINGS: Cardiac silhouette  is normal in size. No mediastinal or hilar masses. No evidence of adenopathy. Clear lungs.  No pleural effusion or pneumothorax. Skeletal structures are intact. IMPRESSION: No active cardiopulmonary disease. Electronically Signed   By: Lajean Manes M.D.   On: 05/16/2019 14:38    Assessment & Plan:   Problem List Items Addressed This Visit     Cerumen impaction    New R>L.  We will remove. See procedure      Hearing loss - Primary    B ears - using  hearing aids.  He has an appointment to see she is a audiologist soon         No orders of the defined types were placed in this encounter.     Follow-up: Return in about 3 months (around 07/26/2022) for a follow-up visit.  Walker Kehr, MD

## 2022-04-25 NOTE — Assessment & Plan Note (Addendum)
New R>L.  We will remove. See procedure

## 2022-04-25 NOTE — Assessment & Plan Note (Addendum)
B ears - using  hearing aids.  He has an appointment to see she is a audiologist soon

## 2022-05-04 ENCOUNTER — Telehealth: Payer: Medicare Other

## 2022-05-18 DIAGNOSIS — H3581 Retinal edema: Secondary | ICD-10-CM | POA: Diagnosis not present

## 2022-05-18 DIAGNOSIS — H353211 Exudative age-related macular degeneration, right eye, with active choroidal neovascularization: Secondary | ICD-10-CM | POA: Diagnosis not present

## 2022-05-18 DIAGNOSIS — H43813 Vitreous degeneration, bilateral: Secondary | ICD-10-CM | POA: Diagnosis not present

## 2022-05-18 DIAGNOSIS — H353223 Exudative age-related macular degeneration, left eye, with inactive scar: Secondary | ICD-10-CM | POA: Diagnosis not present

## 2022-05-30 DIAGNOSIS — Z85828 Personal history of other malignant neoplasm of skin: Secondary | ICD-10-CM | POA: Diagnosis not present

## 2022-05-30 DIAGNOSIS — L814 Other melanin hyperpigmentation: Secondary | ICD-10-CM | POA: Diagnosis not present

## 2022-05-30 DIAGNOSIS — Z85820 Personal history of malignant melanoma of skin: Secondary | ICD-10-CM | POA: Diagnosis not present

## 2022-05-30 DIAGNOSIS — L578 Other skin changes due to chronic exposure to nonionizing radiation: Secondary | ICD-10-CM | POA: Diagnosis not present

## 2022-05-30 DIAGNOSIS — Z08 Encounter for follow-up examination after completed treatment for malignant neoplasm: Secondary | ICD-10-CM | POA: Diagnosis not present

## 2022-05-30 DIAGNOSIS — I872 Venous insufficiency (chronic) (peripheral): Secondary | ICD-10-CM | POA: Diagnosis not present

## 2022-05-30 DIAGNOSIS — D225 Melanocytic nevi of trunk: Secondary | ICD-10-CM | POA: Diagnosis not present

## 2022-05-30 DIAGNOSIS — L821 Other seborrheic keratosis: Secondary | ICD-10-CM | POA: Diagnosis not present

## 2022-05-30 DIAGNOSIS — L570 Actinic keratosis: Secondary | ICD-10-CM | POA: Diagnosis not present

## 2022-06-04 ENCOUNTER — Other Ambulatory Visit: Payer: Self-pay | Admitting: Internal Medicine

## 2022-06-04 DIAGNOSIS — E119 Type 2 diabetes mellitus without complications: Secondary | ICD-10-CM

## 2022-06-04 DIAGNOSIS — E118 Type 2 diabetes mellitus with unspecified complications: Secondary | ICD-10-CM

## 2022-06-16 DIAGNOSIS — L570 Actinic keratosis: Secondary | ICD-10-CM | POA: Diagnosis not present

## 2022-06-27 ENCOUNTER — Other Ambulatory Visit: Payer: Self-pay | Admitting: Internal Medicine

## 2022-06-27 DIAGNOSIS — E785 Hyperlipidemia, unspecified: Secondary | ICD-10-CM

## 2022-06-27 DIAGNOSIS — E119 Type 2 diabetes mellitus without complications: Secondary | ICD-10-CM

## 2022-07-11 ENCOUNTER — Ambulatory Visit (INDEPENDENT_AMBULATORY_CARE_PROVIDER_SITE_OTHER): Payer: Medicare Other

## 2022-07-11 VITALS — Ht 69.0 in | Wt 189.0 lb

## 2022-07-11 DIAGNOSIS — Z0000 Encounter for general adult medical examination without abnormal findings: Secondary | ICD-10-CM | POA: Diagnosis not present

## 2022-07-11 NOTE — Patient Instructions (Signed)
Nicholas Burton , Thank you for taking time to come for your Medicare Wellness Visit. I appreciate your ongoing commitment to your health goals. Please review the following plan we discussed and let me know if I can assist you in the future.   These are the goals we discussed:  Goals      Manage My Medicine     Timeframe:  Long-Range Goal Priority:  High Start Date:  08/17/2021                           Expected End Date:  08/17/2022                     Follow Up Date 12/16/2021   - call if I am sick and can't take my medicine - keep a list of all the medicines I take; vitamins and herbals too - learn to read medicine labels - use a pillbox to sort medicine    Why is this important?   These steps will help you keep on track with your medicines.        This is a list of the screening recommended for you and due dates:  Health Maintenance  Topic Date Due   COVID-19 Vaccine (4 - Moderna series) 08/26/2020   Eye exam for diabetics  09/04/2020   Complete foot exam   09/16/2020   Medicare Annual Wellness Visit  07/12/2023   Tetanus Vaccine  09/21/2030   Pneumonia Vaccine  Completed   Flu Shot  Completed   Zoster (Shingles) Vaccine  Completed   HPV Vaccine  Aged Out    Advanced directives: No  Conditions/risks identified: Yes  Next appointment: Follow up in one year for your annual wellness visit.   Preventive Care 75 Years and Older, Male  Preventive care refers to lifestyle choices and visits with your health care provider that can promote health and wellness. What does preventive care include? A yearly physical exam. This is also called an annual well check. Dental exams once or twice a year. Routine eye exams. Ask your health care provider how often you should have your eyes checked. Personal lifestyle choices, including: Daily care of your teeth and gums. Regular physical activity. Eating a healthy diet. Avoiding tobacco and drug use. Limiting alcohol use. Practicing  safe sex. Taking low doses of aspirin every day. Taking vitamin and mineral supplements as recommended by your health care provider. What happens during an annual well check? The services and screenings done by your health care provider during your annual well check will depend on your age, overall health, lifestyle risk factors, and family history of disease. Counseling  Your health care provider may ask you questions about your: Alcohol use. Tobacco use. Drug use. Emotional well-being. Home and relationship well-being. Sexual activity. Eating habits. History of falls. Memory and ability to understand (cognition). Work and work Statistician. Screening  You may have the following tests or measurements: Height, weight, and BMI. Blood pressure. Lipid and cholesterol levels. These may be checked every 5 years, or more frequently if you are over 31 years old. Skin check. Lung cancer screening. You may have this screening every year starting at age 38 if you have a 30-pack-year history of smoking and currently smoke or have quit within the past 15 years. Fecal occult blood test (FOBT) of the stool. You may have this test every year starting at age 15. Flexible sigmoidoscopy or colonoscopy. You may have  a sigmoidoscopy every 5 years or a colonoscopy every 10 years starting at age 70. Prostate cancer screening. Recommendations will vary depending on your family history and other risks. Hepatitis C blood test. Hepatitis B blood test. Sexually transmitted disease (STD) testing. Diabetes screening. This is done by checking your blood sugar (glucose) after you have not eaten for a while (fasting). You may have this done every 1-3 years. Abdominal aortic aneurysm (AAA) screening. You may need this if you are a current or former smoker. Osteoporosis. You may be screened starting at age 60 if you are at high risk. Talk with your health care provider about your test results, treatment options, and  if necessary, the need for more tests. Vaccines  Your health care provider may recommend certain vaccines, such as: Influenza vaccine. This is recommended every year. Tetanus, diphtheria, and acellular pertussis (Tdap, Td) vaccine. You may need a Td booster every 10 years. Zoster vaccine. You may need this after age 70. Pneumococcal 13-valent conjugate (PCV13) vaccine. One dose is recommended after age 82. Pneumococcal polysaccharide (PPSV23) vaccine. One dose is recommended after age 42. Talk to your health care provider about which screenings and vaccines you need and how often you need them. This information is not intended to replace advice given to you by your health care provider. Make sure you discuss any questions you have with your health care provider. Document Released: 09/11/2015 Document Revised: 05/04/2016 Document Reviewed: 06/16/2015 Elsevier Interactive Patient Education  2017 [place] Prevention in the Home Falls can cause injuries. They can happen to people of all ages. There are many things you can do to make your home safe and to help prevent falls. What can I do on the outside of my home? Regularly fix the edges of walkways and driveways and fix any cracks. Remove anything that might make you trip as you walk through a door, such as a raised step or threshold. Trim any bushes or trees on the path to your home. Use bright outdoor lighting. Clear any walking paths of anything that might make someone trip, such as rocks or tools. Regularly check to see if handrails are loose or broken. Make sure that both sides of any steps have handrails. Any raised decks and porches should have guardrails on the edges. Have any leaves, snow, or ice cleared regularly. Use sand or salt on walking paths during winter. Clean up any spills in your garage right away. This includes oil or grease spills. What can I do in the bathroom? Use night lights. Install grab bars by the  toilet and in the tub and shower. Do not use towel bars as grab bars. Use non-skid mats or decals in the tub or shower. If you need to sit down in the shower, use a plastic, non-slip stool. Keep the floor dry. Clean up any water that spills on the floor as soon as it happens. Remove soap buildup in the tub or shower regularly. Attach bath mats securely with double-sided non-slip rug tape. Do not have throw rugs and other things on the floor that can make you trip. What can I do in the bedroom? Use night lights. Make sure that you have a light by your bed that is easy to reach. Do not use any sheets or blankets that are too big for your bed. They should not hang down onto the floor. Have a firm chair that has side arms. You can use this for support while you get dressed. Do  not have throw rugs and other things on the floor that can make you trip. What can I do in the kitchen? Clean up any spills right away. Avoid walking on wet floors. Keep items that you use a lot in easy-to-reach places. If you need to reach something above you, use a strong step stool that has a grab bar. Keep electrical cords out of the way. Do not use floor polish or wax that makes floors slippery. If you must use wax, use non-skid floor wax. Do not have throw rugs and other things on the floor that can make you trip. What can I do with my stairs? Do not leave any items on the stairs. Make sure that there are handrails on both sides of the stairs and use them. Fix handrails that are broken or loose. Make sure that handrails are as long as the stairways. Check any carpeting to make sure that it is firmly attached to the stairs. Fix any carpet that is loose or worn. Avoid having throw rugs at the top or bottom of the stairs. If you do have throw rugs, attach them to the floor with carpet tape. Make sure that you have a light switch at the top of the stairs and the bottom of the stairs. If you do not have them, ask someone  to add them for you. What else can I do to help prevent falls? Wear shoes that: Do not have high heels. Have rubber bottoms. Are comfortable and fit you well. Are closed at the toe. Do not wear sandals. If you use a stepladder: Make sure that it is fully opened. Do not climb a closed stepladder. Make sure that both sides of the stepladder are locked into place. Ask someone to hold it for you, if possible. Clearly [place] and make sure that you can see: Any grab bars or handrails. First and last steps. Where the edge of each step is. Use tools that help you move around (mobility aids) if they are needed. These include: Canes. Walkers. Scooters. Crutches. Turn on the lights when you go into a dark area. Replace any light bulbs as soon as they burn out. Set up your furniture so you have a clear path. Avoid moving your furniture around. If any of your floors are uneven, fix them. If there are any pets around you, be aware of where they are. Review your medicines with your doctor. Some medicines can make you feel dizzy. This can increase your chance of falling. Ask your doctor what other things that you can do to help prevent falls. This information is not intended to replace advice given to you by your health care provider. Make sure you discuss any questions you have with your health care provider. Document Released: 06/11/2009 Document Revised: 01/21/2016 Document Reviewed: 09/19/2014 Elsevier Interactive Patient Education  2017 [employer].

## 2022-07-11 NOTE — Progress Notes (Signed)
Virtual Visit via Telephone Note  I connected with  Nicholas Burton on 07/11/22 at  3:30 PM EST by telephone and verified that I am speaking with the correct person using two identifiers.  Location: Patient: Home with wife Provider: [place] Persons participating in the virtual visit: [place]   I discussed the limitations, risks, security and privacy concerns of performing an evaluation and management service by telephone and the availability of in person appointments. The patient expressed understanding and agreed to proceed.  Interactive audio and video telecommunications were attempted between this nurse and patient, however failed, due to patient having technical difficulties OR patient did not have access to video capability.  We continued and completed visit with audio only.  Some vital signs may be absent or patient reported.   Sheral Flow, LPN  Subjective:   Nicholas Burton is a 88 y.o. male who presents for Medicare Annual/Subsequent preventive examination.  Review of Systems     Cardiac Risk Factors include: advanced age (>52mn, >>5women);diabetes mellitus;dyslipidemia;hypertension;male gender;sedentary lifestyle;family history of premature cardiovascular disease     Objective:    Today's Vitals   07/11/22 1547  Weight: 189 lb (85.7 kg)  Height: _0  (1.753 m)  PainSc: 0-No pain   Body mass index is 27.91 kg/m.     07/11/2022    3:50 PM 07/09/2021    1:49 PM 03/18/2020    1:45 PM 02/19/2019    1:28 PM 12/22/2017   10:42 AM 12/19/2016   10:40 AM 02/16/2016    1:31 PM  Advanced Directives  Does Patient Have a Medical Advance Directive? No Yes Yes No No No No  Type of ACorporate treasurerof ARaymondLiving will Living will;Healthcare Power of Attorney      Does patient want to make changes to medical advance directive?   No - Patient declined      Copy of HClimaxin Chart?  No - copy  requested No - copy requested      Would patient like information on creating a medical advance directive? No - Patient declined   No - Patient declined Yes (ED - Information included in AVS) Yes (MAU/Ambulatory/Procedural Areas - Information given) Yes - Educational materials given    Current Medications (verified) Outpatient Encounter Medications as of 07/11/2022  Medication Sig   atorvastatin (LIPITOR) 10 MG tablet Take 1 tablet (10 mg total) by mouth daily.   B-D ULTRAFINE III Sarrazin PEN 31G X 8 MM MISC USE TO INJECT INSULIN DAILY   Blood Glucose Monitoring Suppl (FREESTYLE FREEDOM LITE) W/DEVICE KIT Use to check blood sugars daily Dx E11.8   cholecalciferol (VITAMIN D) 1000 units tablet Take 1,000 Units by mouth daily.   Colesevelam HCl 3.75 g PACK MIX AND DRINK 1 PACKET DAILY   Continuous Blood Gluc Receiver (FREESTYLE LIBRE 2 READER) DEVI 1 Act by Does not apply route daily.   Continuous Blood Gluc Sensor (FREESTYLE LIBRE 2 SENSOR) MISC 1 Act by Does not apply route daily.   Dietary Management Product (VASCULERA) TABS Take 1 tablet by mouth daily.   FREESTYLE LITE test strip USE AS INSTRUCTED TO CHECK BLOOD SUGAR TWICE A DAY   GEMTESA 75 MG TABS TAKE 1 TABLET DAILY   Glucagon (GVOKE HYPOPEN 2-PACK) 1 MG/0.2ML SOAJ Inject 1 Act into the skin daily as needed.   Lancets (FREESTYLE) lancets Use to check blood sugars twice day Dx E11.8   LANTUS SOLOSTAR 100 UNIT/ML  Solostar Pen INJECT 0.3ML (30 UNITS TOTAL) INTO THE SKIN AT BEDTIME   losartan (COZAAR) 25 MG tablet Take 1 tablet (25 mg total) by mouth daily.   metFORMIN (GLUCOPHAGE) 500 MG tablet TAKE 1 TABLET TWICE A DAY WITH MEALS   metoprolol tartrate (LOPRESSOR) 25 MG tablet TAKE ONE-HALF (1/2) TABLET BY MOUTH TWICE A DAY   Multiple Vitamins-Minerals (PRESERVISION/LUTEIN) CAPS Take 1 capsule by mouth 2 (two) times daily.   torsemide (DEMADEX) 10 MG tablet Take 1 tablet (10 mg total) by mouth daily.   vitamin B-12 (CYANOCOBALAMIN) 500  MCG tablet Take 500 mcg by mouth daily.   No facility-administered encounter medications on file as of 07/11/2022.    Allergies (verified) Ace inhibitors, Oxycodone-acetaminophen, and Penicillins   History: Past Medical History:  Diagnosis Date   Allergy    ANEMIA ASSOCIATED W/OTHER [facility] NUTRITIONAL DEFIC 08/11/2010   Qualifier: Diagnosis of  By: Ronnald Ramp MD, [name].    Cancer [facility])    skin   Cataract    Chronic combined systolic and diastolic congestive heart failure ([place]) 10/30/2018   COPD with asthma 03/02/2017   CRI (chronic renal insufficiency), stage 3 (moderate) (HCC) 10/25/2018   Deficiency anemia 10/25/2018   Diabetes mellitus    type 2   DOE (dyspnea on exertion) 09/03/2018   Edema 06/11/2009   Qualifier: Diagnosis of  By: Ronnald Ramp MD, [name].    History of skin cancer    Hyperlipidemia with target LDL less than 100 11/10/2008        Hypertension    Memory loss 05/14/2009   Obesity (BMI 30.0-34.9) 04/01/2013   Osteoarthritis    PSA elevation 10/25/2018   SKIN CANCER, HX OF 11/10/2008   Qualifier: Diagnosis of  By: Ronnald Ramp MD, [name].    Snoring 04/03/2014   Type II diabetes mellitus with manifestations ([place]) 11/10/2008   Estimated Creatinine Clearance: 38 mL/min (A) (by C-G formula based on SCr of 1.52 mg/dL (H)).    Venous stasis dermatitis of both lower extremities 01/29/2018   Vitamin D deficiency 11/10/2008   Past Surgical History:  Procedure Laterality Date   CHOLECYSTECTOMY     INGUINAL HERNIA REPAIR     x 3   JOINT REPLACEMENT     KNEE SURGERY     x 2   MELANOMA EXCISION     x 3 -- Left arm   TOTAL KNEE ARTHROPLASTY     x 3   Family History  Problem Relation Age of Onset   Heart disease Mother    Diabetes Mother    Heart disease Father    Cancer Sister    Cancer Brother    Diabetes Son    Diabetes Other    Social History   Socioeconomic History   Marital status: Married    Spouse name: Not on file   Number of children: 4   Years of education: Not on  file   Highest education level: Not on file  Occupational History   Occupation: retired    Fish farm manager: RETIRED  Tobacco Use   Smoking status: Never   Smokeless tobacco: Never  Vaping Use   Vaping Use: Never used  Substance and Sexual Activity   Alcohol use: No   Drug use: No   Sexual activity: Yes    Birth control/protection: None  Other Topics Concern   Not on file  Social History Narrative   No regular exercise   Social Determinants of Health   Financial Resource Strain: Low Risk  (  07/11/2022)   Overall Financial Resource Strain (CARDIA)    Difficulty of Paying Living Expenses: Not hard at all  Food Insecurity: No Food Insecurity (07/11/2022)   Hunger Vital Sign    Worried About Running Out of Food in the Last Year: Never true    Ran Out of Food in the Last Year: Never true  Transportation Needs: No Transportation Needs (07/11/2022)   PRAPARE - Hydrologist (Medical): No    Lack of Transportation (Non-Medical): No  Physical Activity: Inactive (07/09/2021)   Exercise Vital Sign    Days of Exercise per Week: 0 days    Minutes of Exercise per Session: 0 min  Stress: No Stress Concern Present (07/11/2022)   [place]    Feeling of Stress : Not at all  Social Connections: Moderately Isolated (07/11/2022)   Social Connection and Isolation Panel [NHANES]    Frequency of Communication with Friends and Family: Twice a week    Frequency of Social Gatherings with Friends and Family: Twice a week    Attends Religious Services: Never    Marine scientist or Organizations: No    Attends Music therapist: Never    Marital Status: Married    Tobacco Counseling Counseling given: Not Answered   Clinical Intake:  Pre-visit preparation completed: Yes  Pain : No/denies pain Pain Score: 0-No pain     BMI - recorded: 27.91 Nutritional Status: BMI 25 -29  Overweight Nutritional Risks: None Diabetes: Yes CBG done?: No Did pt. bring in CBG monitor from home?: No  How often do you need to have someone help you when you read instructions, pamphlets, or other written materials from your doctor or pharmacy?: 1 - Never What is the last grade level you completed in school?: HSG  Nutrition Risk Assessment:  Has the patient had any N/V/D within the last 2 months?  No  Does the patient have any non-healing wounds?  No  Has the patient had any unintentional weight loss or weight gain?  No   Diabetes:  Is the patient diabetic?  Yes  If diabetic, was a CBG obtained today?  No  Did the patient bring in their glucometer from home?  No  How often do you monitor your CBG's? Freestyle Glucose Monitoring System.   Financial Strains and Diabetes Management:  Are you having any financial strains with the device, your supplies or your medication? No .  Does the patient want to be seen by Chronic Care Management for management of their diabetes?  No  Would the patient like to be referred to a Nutritionist or for Diabetic Management?  No   Diabetic Exams:  Diabetic Eye Exam: Overdue for diabetic eye exam. Pt has been advised about the importance in completing this exam. Patient advised to call and schedule an eye exam. Diabetic Foot Exam: Overdue, Pt has been advised about the importance in completing this exam. Pt is scheduled for diabetic foot exam on 07/26/2022.   Interpreter Needed?: No  Information entered by :: Lisette Abu, LPN.   Activities of Daily Living    07/11/2022    3:50 PM  In your present state of health, do you have any difficulty performing the following activities:  Hearing? 1  Vision? 0  Difficulty concentrating or making decisions? 1  Walking or climbing stairs? 0  Dressing or bathing? 0  Doing errands, shopping? 1  Preparing Food and eating ? N  Using the Toilet? N  In the past six months, have you accidently  leaked urine? N  Do you have problems with loss of bowel control? N  Managing your Medications? N  Managing your Finances? N  Housekeeping or managing your Housekeeping? N    Patient Care Team: [name], MD as PCP - General [place] Charlann Lange, MD as PCP - Cardiology (Cardiology) [place], Darnelle Maffucci, [facility] (Inactive) as Pharmacist (Pharmacist) Sherlynn Stalls, MD as Consulting Physician (Ophthalmology)  Indicate any recent Medical Services you may have received from other than [facility] providers in the past year (date may be approximate).     Assessment:   This is a routine wellness examination for [name].  Hearing/Vision screen Hearing Screening - Comments:: Patient wears hearing aids. Vision Screening - Comments:: Wears rx glasses - up to date with routine eye exams with Lorrene Reid, MD.   Dietary issues and exercise activities discussed: Current Exercise Habits: The patient does not participate in regular exercise at present, Exercise limited by: orthopedic condition(s);respiratory conditions(s);neurologic condition(s)   Goals Addressed   None   Depression Screen    07/11/2022    3:49 PM 12/27/2021    3:00 PM 07/09/2021    1:55 PM 07/09/2021    1:47 PM 04/22/2021   11:11 AM 03/18/2020    1:46 PM 09/17/2019    9:53 AM  PHQ 2/9 Scores  PHQ - 2 Score 0 0 0 0 0 2 1  PHQ- 9 Score      6     Fall Risk    07/11/2022    3:20 PM 04/01/2022    3:05 PM 02/02/2022    3:00 PM 12/27/2021    3:00 PM 10/27/2021    3:00 PM  [place] in the past year? _0 Comment  continues to deny new/ recent falls- reports last fall as Nov 2022; uses prosthesis for ambulation continues to report last fall as "November 2022" continues using cane  denies new/ recent falls  Number falls in past yr: 1 0 0    Injury with Fall? 1 0 0    Risk for fall due to : History of fall(s);Impaired balance/gait;Orthopedic patient History of fall(s);Medication side effect;Other (Comment);Impaired mobility  Impaired mobility;History of fall(s);Medication side effect  History of fall(s);Impaired mobility  Risk for fall due to: Comment  has bilateral LE prosthesis   uses cane as indicated  Follow up Education provided;Falls prevention discussed Falls prevention discussed Falls prevention discussed  Falls prevention discussed    FALL RISK PREVENTION PERTAINING TO THE HOME:  Any stairs in or around the home? Yes  If so, are there any without handrails? No  Home free of loose throw rugs in walkways, pet beds, electrical cords, etc? Yes  Adequate lighting in your home to reduce risk of falls? Yes   ASSISTIVE DEVICES UTILIZED TO PREVENT FALLS:  Life alert? No  Use of a cane, walker or w/c? No  Grab bars in the bathroom? Yes  Shower chair or bench in shower? Yes  Elevated toilet seat or a handicapped toilet? Yes   TIMED UP AND GO:  Was the test performed? No . Phone Visit   Cognitive Function:  Patient has current diagnosis of cognitive impairment.  Patient is unable to complete screening 6CIT or MMSE.      12/22/2017   12:35 PM 12/19/2016   10:31 AM  MMSE - Mini Mental State Exam  Orientation to time 5 5  Orientation to Place 5 5  Registration 3 3  Attention/ Calculation 5 4  Recall 1 2  Language- name 2 objects 2 2  Language- repeat 1 1  Language- follow 3 step command 3 3  Language- read & follow direction 1 1  Write a sentence 1 1  Copy design 1 1  Total score 28 28        07/11/2022    3:52 PM 03/18/2020    1:50 PM  6CIT Screen  What Year? 0 points 0 points  What month? 0 points 0 points  What time? 0 points 0 points  Count back from 20 0 points 0 points  Months in reverse 0 points 2 points  Repeat phrase 0 points 2 points  Total Score 0 points 4 points    Immunizations Immunization History  Administered Date(s) Administered   Fluad Quad(high Dose 65+) 05/16/2019, 05/16/2022   Influenza Split 05/24/2012, 04/29/2014   Influenza Whole 08/30/2007, 05/12/2010    Influenza, High Dose Seasonal PF 05/29/2012, 04/28/2015, 05/03/2016, 05/29/2017, 05/01/2018   Influenza,inj,Quad PF,6+ Mos 06/03/2013, 05/20/2014, 06/30/2017   Influenza-Unspecified 04/29/2010, 05/30/2019, 05/29/2021   Moderna Sars-Covid-2 Vaccination 10/12/2019, 11/10/2019, 07/01/2020   PFIZER Comirnaty(Gray Top)Covid-19 Tri-Sucrose Vaccine 05/16/2022   Pneumococcal Conjugate-13 01/01/2014, 06/12/2014   Pneumococcal Polysaccharide-23 08/29/2006, 04/01/2013, 09/17/2019   Pneumococcal-Unspecified 08/30/2007   Td 08/29/2002   Tdap 04/01/2013, 04/01/2014, 09/21/2020   Varicella 07/21/2008   Zoster Recombinat (Shingrix) 05/17/2018, 07/16/2018   Zoster, Live 07/16/2008    TDAP status: Up to date  Flu Vaccine status: Up to date  Pneumococcal vaccine status: Up to date  Covid-19 vaccine status: Completed vaccines  Qualifies for Shingles Vaccine? Yes   Zostavax completed Yes   Shingrix Completed?: Yes  Screening Tests Health Maintenance  Topic Date Due   OPHTHALMOLOGY EXAM  09/04/2020   FOOT EXAM  09/16/2020   COVID-19 Vaccine (5 - Moderna series) 07/11/2022   Medicare Annual Wellness (AWV)  07/12/2023   TETANUS/TDAP  09/21/2030   Pneumonia Vaccine 40+ Years old  Completed   INFLUENZA VACCINE  Completed   Zoster Vaccines- Shingrix  Completed   HPV VACCINES  Aged Out    Health Maintenance  Health Maintenance Due  Topic Date Due   OPHTHALMOLOGY EXAM  09/04/2020   FOOT EXAM  09/16/2020   COVID-19 Vaccine (5 - Moderna series) 07/11/2022    Colorectal cancer screening: No longer required.   Lung Cancer Screening: (Low Dose CT Chest recommended if Age 74-80 years, 30 pack-year currently smoking OR have quit w/in 15years.) does not qualify.   Lung Cancer Screening Referral: no  Additional Screening:  Hepatitis C Screening: does not qualify; Completed no  Vision Screening: Recommended annual ophthalmology exams for early detection of glaucoma and other disorders of the  eye. Is the patient up to date with their annual eye exam?  Yes  Who is the provider or what is the name of the office in which the patient attends annual eye exams? Sherlynn Stalls, MD. If pt is not established with a provider, would they like to be referred to a provider to establish care? No .   Dental Screening: Recommended annual dental exams for proper oral hygiene  Community Resource Referral / Chronic Care Management: CRR required this visit?  No   CCM required this visit?  No      Plan:     I have personally reviewed and noted the following in the patient's chart:   Medical and social history Use of alcohol, tobacco  or illicit drugs  Current medications and supplements including opioid prescriptions. Patient is not currently taking opioid prescriptions. Functional ability and status Nutritional status Physical activity Advanced directives List of other physicians Hospitalizations, surgeries, and ER visits in previous 12 months Vitals Screenings to include cognitive, depression, and falls Referrals and appointments  In addition, I have reviewed and discussed with patient certain preventive protocols, quality metrics, and best practice recommendations. A written personalized care plan for preventive services as well as general preventive health recommendations were provided to patient.     Sheral Flow, LPN   11/07/8116   Nurse Notes: N/A

## 2022-07-25 ENCOUNTER — Other Ambulatory Visit: Payer: Self-pay | Admitting: Internal Medicine

## 2022-07-25 ENCOUNTER — Other Ambulatory Visit: Payer: Self-pay | Admitting: Interventional Cardiology

## 2022-07-25 DIAGNOSIS — I10 Essential (primary) hypertension: Secondary | ICD-10-CM

## 2022-07-25 DIAGNOSIS — N3281 Overactive bladder: Secondary | ICD-10-CM

## 2022-07-26 ENCOUNTER — Ambulatory Visit (INDEPENDENT_AMBULATORY_CARE_PROVIDER_SITE_OTHER): Payer: Medicare Other | Admitting: Internal Medicine

## 2022-07-26 ENCOUNTER — Ambulatory Visit (INDEPENDENT_AMBULATORY_CARE_PROVIDER_SITE_OTHER): Payer: Medicare Other

## 2022-07-26 ENCOUNTER — Encounter: Payer: Self-pay | Admitting: Internal Medicine

## 2022-07-26 VITALS — BP 138/72 | HR 77 | Temp 98.2°F | Ht 69.0 in | Wt 188.0 lb

## 2022-07-26 DIAGNOSIS — N183 Chronic kidney disease, stage 3 unspecified: Secondary | ICD-10-CM | POA: Diagnosis not present

## 2022-07-26 DIAGNOSIS — E118 Type 2 diabetes mellitus with unspecified complications: Secondary | ICD-10-CM

## 2022-07-26 DIAGNOSIS — M545 Low back pain, unspecified: Secondary | ICD-10-CM | POA: Diagnosis not present

## 2022-07-26 DIAGNOSIS — D508 Other iron deficiency anemias: Secondary | ICD-10-CM

## 2022-07-26 DIAGNOSIS — I10 Essential (primary) hypertension: Secondary | ICD-10-CM | POA: Diagnosis not present

## 2022-07-26 DIAGNOSIS — E538 Deficiency of other specified B group vitamins: Secondary | ICD-10-CM

## 2022-07-26 DIAGNOSIS — M47816 Spondylosis without myelopathy or radiculopathy, lumbar region: Secondary | ICD-10-CM | POA: Diagnosis not present

## 2022-07-26 DIAGNOSIS — M5441 Lumbago with sciatica, right side: Secondary | ICD-10-CM | POA: Diagnosis not present

## 2022-07-26 DIAGNOSIS — M5442 Lumbago with sciatica, left side: Secondary | ICD-10-CM

## 2022-07-26 DIAGNOSIS — I5042 Chronic combined systolic (congestive) and diastolic (congestive) heart failure: Secondary | ICD-10-CM | POA: Diagnosis not present

## 2022-07-26 LAB — HEPATIC FUNCTION PANEL
ALT: 16 U/L (ref 0–53)
AST: 19 U/L (ref 0–37)
Albumin: 3.8 g/dL (ref 3.5–5.2)
Alkaline Phosphatase: 119 U/L — ABNORMAL HIGH (ref 39–117)
Bilirubin, Direct: 0.1 mg/dL (ref 0.0–0.3)
Total Bilirubin: 0.5 mg/dL (ref 0.2–1.2)
Total Protein: 6.6 g/dL (ref 6.0–8.3)

## 2022-07-26 LAB — URINALYSIS, ROUTINE W REFLEX MICROSCOPIC
Bilirubin Urine: NEGATIVE
Hgb urine dipstick: NEGATIVE
Ketones, ur: NEGATIVE
Leukocytes,Ua: NEGATIVE
Nitrite: NEGATIVE
RBC / HPF: NONE SEEN (ref 0–?)
Specific Gravity, Urine: 1.01 (ref 1.000–1.030)
Urine Glucose: NEGATIVE
Urobilinogen, UA: 0.2 (ref 0.0–1.0)
pH: 6 (ref 5.0–8.0)

## 2022-07-26 LAB — CBC WITH DIFFERENTIAL/PLATELET
Basophils Absolute: 0.1 10*3/uL (ref 0.0–0.1)
Basophils Relative: 0.8 % (ref 0.0–3.0)
Eosinophils Absolute: 0.3 10*3/uL (ref 0.0–0.7)
Eosinophils Relative: 2.2 % (ref 0.0–5.0)
HCT: 38.7 % — ABNORMAL LOW (ref 39.0–52.0)
Hemoglobin: 12.9 g/dL — ABNORMAL LOW (ref 13.0–17.0)
Lymphocytes Relative: 28 % (ref 12.0–46.0)
Lymphs Abs: 3.3 10*3/uL (ref 0.7–4.0)
MCHC: 33.3 g/dL (ref 30.0–36.0)
MCV: 86.3 fl (ref 78.0–100.0)
Monocytes Absolute: 1.2 10*3/uL — ABNORMAL HIGH (ref 0.1–1.0)
Monocytes Relative: 10 % (ref 3.0–12.0)
Neutro Abs: 7 10*3/uL (ref 1.4–7.7)
Neutrophils Relative %: 59 % (ref 43.0–77.0)
Platelets: 278 10*3/uL (ref 150.0–400.0)
RBC: 4.48 Mil/uL (ref 4.22–5.81)
RDW: 14.5 % (ref 11.5–15.5)
WBC: 11.9 10*3/uL — ABNORMAL HIGH (ref 4.0–10.5)

## 2022-07-26 LAB — IBC + FERRITIN
Ferritin: 39.4 ng/mL (ref 22.0–322.0)
Iron: 52 ug/dL (ref 42–165)
Saturation Ratios: 15.9 % — ABNORMAL LOW (ref 20.0–50.0)
TIBC: 327.6 ug/dL (ref 250.0–450.0)
Transferrin: 234 mg/dL (ref 212.0–360.0)

## 2022-07-26 LAB — HEMOGLOBIN A1C: Hgb A1c MFr Bld: 8 % — ABNORMAL HIGH (ref 4.6–6.5)

## 2022-07-26 LAB — BASIC METABOLIC PANEL
BUN: 17 mg/dL (ref 6–23)
CO2: 31 mEq/L (ref 19–32)
Calcium: 9.1 mg/dL (ref 8.4–10.5)
Chloride: 103 mEq/L (ref 96–112)
Creatinine, Ser: 1.34 mg/dL (ref 0.40–1.50)
GFR: 45.96 mL/min — ABNORMAL LOW (ref >60.00)
Glucose, Bld: 180 mg/dL — ABNORMAL HIGH (ref 70–99)
Potassium: 3.5 mEq/L (ref 3.5–5.1)
Sodium: 140 mEq/L (ref 135–145)

## 2022-07-26 LAB — TSH: TSH: 3.88 u[IU]/mL (ref 0.35–5.50)

## 2022-07-26 LAB — BRAIN NATRIURETIC PEPTIDE: Pro B Natriuretic peptide (BNP): 59 pg/mL (ref 0.0–100.0)

## 2022-07-26 LAB — FOLATE: Folate: >23.8 ng/mL (ref >5.9)

## 2022-07-26 MED ORDER — EMPAGLIFLOZIN 10 MG PO TABS: 10.0000 mg | ORAL_TABLET | Freq: Every day | ORAL | 0 refills | Status: DC

## 2022-07-26 NOTE — Progress Notes (Signed)
Subjective:  Patient ID: Nicholas Burton, male    DOB: [birth year]  Age: 88 y.o. MRN: [id number]  CC: Back Pain, Anemia, Congestive Heart Failure, Diabetes, and Hypertension   HPI Nicholas Burton presents for f/up -   He complains of a 2-week history of low back pain with no history of trauma or injury.  The pain does not radiate and he denies lower extremity paresthesias.  He is active and denies any recent episodes of chest pain but has his baseline amount of shortness of breath and lower extremity edema.  Outpatient Medications Prior to Visit  Medication Sig Dispense Refill   atorvastatin (LIPITOR) 10 MG tablet Take 1 tablet (10 mg total) by mouth daily. 90 tablet 3   B-D ULTRAFINE III [place] PEN 31G X 8 MM MISC USE TO INJECT INSULIN DAILY 300 each 3   Blood Glucose Monitoring Suppl (FREESTYLE FREEDOM LITE) W/DEVICE KIT Use to check blood sugars daily Dx E11.8 1 each 0   cholecalciferol (VITAMIN D) 1000 units tablet Take 1,000 Units by mouth daily.     Colesevelam HCl 3.75 g PACK MIX AND DRINK 1 PACKET DAILY 90 packet 0   Continuous Blood Gluc Receiver (FREESTYLE LIBRE 2 READER) DEVI 1 Act by Does not apply route daily. 2 each 5   Continuous Blood Gluc Sensor (FREESTYLE LIBRE 2 SENSOR) MISC 1 Act by Does not apply route daily. 2 each 5   Dietary Management Product (VASCULERA) TABS Take 1 tablet by mouth daily. 30 tablet 11   FREESTYLE LITE test strip USE AS INSTRUCTED TO CHECK BLOOD SUGAR TWICE A DAY 200 strip 3   Glucagon (GVOKE HYPOPEN 2-PACK) 1 MG/0.2ML SOAJ Inject 1 Act into the skin daily as needed. 2 mL 6   Lancets (FREESTYLE) lancets Use to check blood sugars twice day Dx E11.8 180 each 3   LANTUS SOLOSTAR 100 UNIT/ML Solostar Pen INJECT 0.3ML (30 UNITS TOTAL) INTO THE SKIN AT BEDTIME 27 mL 1   losartan (COZAAR) 25 MG tablet Take 1 tablet (25 mg total) by mouth daily. 90 tablet 3   metFORMIN (GLUCOPHAGE) 500 MG tablet TAKE 1 TABLET TWICE A DAY WITH MEALS 180 tablet 0   metoprolol  tartrate (LOPRESSOR) 25 MG tablet TAKE ONE-HALF (1/2) TABLET BY MOUTH TWICE A DAY 90 tablet 3   Multiple Vitamins-Minerals (PRESERVISION/LUTEIN) CAPS Take 1 capsule by mouth 2 (two) times daily.     torsemide (DEMADEX) 10 MG tablet Take 1 tablet (10 mg total) by mouth daily. Please call our office to schedule a yearly follow up for future refills. [phone]. Thank you (First attempt) 30 tablet 0   Vibegron (GEMTESA) 75 MG TABS TAKE 1 TABLET DAILY 90 tablet 1   vitamin B-12 (CYANOCOBALAMIN) 500 MCG tablet Take 500 mcg by mouth daily.     No facility-administered medications prior to visit.    ROS Review of Systems  Constitutional: Negative.  Negative for diaphoresis and fatigue.  HENT: Negative.    Eyes: Negative.   Respiratory:  Positive for shortness of breath. Negative for cough, chest tightness and wheezing.   Cardiovascular:  Negative for chest pain, palpitations and leg swelling.  Gastrointestinal:  Negative for abdominal pain, diarrhea and nausea.  Endocrine: Negative.   Genitourinary: Negative.  Negative for difficulty urinating.  Musculoskeletal:  Positive for back pain. Negative for arthralgias and myalgias.  Skin: Negative.  Negative for rash.  Neurological: Negative.  Negative for weakness.  Hematological:  Negative for adenopathy. Does not bruise/bleed easily.  Psychiatric/Behavioral: Negative.      Objective:  BP 138/72 (BP Location: Right Arm, Patient Position: Sitting, Cuff Size: Large)   Pulse 77   Temp 98.2 F (36.8 C) (Oral)   Ht _0  (1.753 m)   Wt 188 lb (85.3 kg)   SpO2 95%   BMI 27.76 kg/m   BP Readings from Last 3 Encounters:  07/26/22 138/72  04/25/22 116/60  12/28/21 138/84    Wt Readings from Last 3 Encounters:  07/26/22 188 lb (85.3 kg)  07/11/22 189 lb (85.7 kg)  04/25/22 189 lb (85.7 kg)    Physical Exam Vitals reviewed.  HENT:     Mouth/Throat:     Mouth: Mucous membranes are moist.  Eyes:     General: No scleral icterus.     Conjunctiva/sclera: Conjunctivae normal.  Cardiovascular:     Rate and Rhythm: Normal rate and regular rhythm.     Heart sounds: No murmur heard.    Comments: EKG- NSR, 77 bpm ?Septal infarct pattern - new LAD, pulm disease pattern  Pulmonary:     Effort: Pulmonary effort is normal.     Breath sounds: No stridor. Examination of the right-lower field reveals rales. Examination of the left-lower field reveals rales. Rales present. No wheezing or rhonchi.  Abdominal:     General: Abdomen is flat.     Palpations: There is no mass.     Tenderness: There is no abdominal tenderness. There is no guarding.     Hernia: No hernia is present.  Musculoskeletal:     Cervical back: Neck supple.     Right lower leg: 1+ Pitting Edema present.     Left lower leg: 1+ Pitting Edema present.  Lymphadenopathy:     Cervical: No cervical adenopathy.  Skin:    General: Skin is warm and dry.  Neurological:     General: No focal deficit present.     Mental Status: He is alert. Mental status is at baseline.  Psychiatric:        Mood and Affect: Mood normal.        Behavior: Behavior normal.     Lab Results  Component Value Date   WBC 11.9 (H) 07/26/2022   HGB 12.9 (L) 07/26/2022   HCT 38.7 (L) 07/26/2022   PLT 278.0 07/26/2022   GLUCOSE 180 (H) 07/26/2022   CHOL 83 09/17/2019   TRIG 136.0 09/17/2019   HDL 36.40 (L) 09/17/2019   LDLCALC 19 09/17/2019   ALT 16 07/26/2022   AST 19 07/26/2022   NA 140 07/26/2022   K 3.5 07/26/2022   CL 103 07/26/2022   CREATININE 1.34 07/26/2022   BUN 17 07/26/2022   CO2 31 07/26/2022   TSH 3.88 07/26/2022   PSA 11.21 (H) 10/30/2017   INR 0.9 12/28/2021   HGBA1C 8.0 (H) 07/26/2022   MICROALBUR 0.7 09/17/2019    DG Chest 2 View  Result Date: 05/16/2019 CLINICAL DATA:  Cough and wheezing for 2 days. EXAM: CHEST - 2 VIEW COMPARISON:  02/05/2016. FINDINGS: Cardiac silhouette is normal in size. No mediastinal or hilar masses. No evidence of adenopathy. Clear  lungs.  No pleural effusion or pneumothorax. Skeletal structures are intact. IMPRESSION: No active cardiopulmonary disease. Electronically Signed   By: Lajean Manes M.D.   On: 05/16/2019 14:38   DG Lumbar Spine Complete  Result Date: 07/26/2022 CLINICAL DATA:  Low back pain for 3 weeks EXAM: LUMBAR SPINE - COMPLETE 4+ VIEW COMPARISON:  05/13/2004 FINDINGS: Mild lumbar levocurvature. Moderate-advanced  multilevel intervertebral disc height loss, progressed from prior. Multilevel facet arthropathy. Vertebral body heights are maintained without evidence of fracture. Abdominal aortic atherosclerosis. IMPRESSION: Moderate-advanced multilevel lumbar spondylosis, progressed from prior. No acute findings. Electronically Signed   By: Davina Poke D.O.   On: 07/26/2022 14:22     Assessment & Plan:   Yisroel was seen today for back pain, anemia, congestive heart failure, diabetes and hypertension.  Diagnoses and all orders for this visit:  Acute bilateral low back pain with bilateral sciatica- Plain films are negative for fracture or metastatic disease. -     DG Lumbar Spine Complete; Future  Essential hypertension- His blood pressure is well-controlled. -     TSH; Future -     Hepatic function panel; Future -     Hepatic function panel -     TSH  Chronic combined systolic and diastolic congestive heart failure ([place])- Will start an SGLT2 inhibitor. -     Brain natriuretic peptide; Future -     EKG 12-Lead -     Brain natriuretic peptide -     empagliflozin (JARDIANCE) 10 MG TABS tablet; Take 1 tablet (10 mg total) by mouth daily before breakfast.  Type II diabetes mellitus with manifestations ([place])- His A1c is up to 8.  Will start an SGLT2 inhibitor. -     Hemoglobin A1c; Future -     Basic metabolic panel; Future -     Hepatic function panel; Future -     HM Diabetes Foot Exam -     Hepatic function panel -     Basic metabolic panel -     Hemoglobin A1c -     empagliflozin (JARDIANCE) 10  MG TABS tablet; Take 1 tablet (10 mg total) by mouth daily before breakfast.  CRI (chronic renal insufficiency), stage 3 (moderate)- Will start an SGLT-2 inh. -     Urinalysis, Routine w reflex microscopic; Future -     Basic metabolic panel; Future -     Basic metabolic panel -     Urinalysis, Routine w reflex microscopic -     empagliflozin (JARDIANCE) 10 MG TABS tablet; Take 1 tablet (10 mg total) by mouth daily before breakfast.  B12 deficiency- H&H are stable.  Will continue B12 replacement therapy. -     CBC with Differential/Platelet; Future -     Folate; Future -     Folate -     CBC with Differential/Platelet  Iron deficiency anemia secondary to inadequate dietary iron intake -     CBC with Differential/Platelet; Future -     IBC + Ferritin; Future -     IBC + Ferritin -     CBC with Differential/Platelet   I am having Delante S. Tennison start on empagliflozin. I am also having him maintain his PreserVision/Lutein, FreeStyle Freedom Lite, freestyle, cyanocobalamin, cholecalciferol, Gvoke HypoPen 2-Pack, FREESTYLE LITE, FreeStyle Libre 2 Sensor, [employer] 2 Reader, metoprolol tartrate, losartan, atorvastatin, Lantus SoloStar, B-D ULTRAFINE III [place] PEN, Vasculera, metFORMIN, Colesevelam HCl, Gemtesa, and torsemide.  Meds ordered this encounter  Medications   empagliflozin (JARDIANCE) 10 MG TABS tablet    Sig: Take 1 tablet (10 mg total) by mouth daily before breakfast.    Dispense:  90 tablet    Refill:  0     Follow-up: Return in about 3 months (around 10/26/2022).  Scarlette Calico, MD

## 2022-07-26 NOTE — Patient Instructions (Signed)
Heart Failure, Diagnosis  Heart failure is a condition in which the heart has trouble pumping blood. This may mean that the heart cannot pump enough blood out to the body or that the heart does not fill up with enough blood. For some people with heart failure, fluid may back up into the lungs. There may also be swelling (edema) in the lower legs. Heart failure is usually a long-term (chronic) condition. It is important for you to take good care of yourself and follow the treatment plan from your health care provider. Different stages of heart failure have different treatment plans. The stages are: Stage A: At risk for heart failure. Having no symptoms of heart failure, but being at risk for developing heart failure. Stage B: Pre-heart failure. Having no symptoms of heart failure, but having structural changes to the heart that indicate heart failure. Stage C: Symptomatic heart failure. Having symptoms of heart failure in addition to structural changes to the heart that indicate heart failure. Stage D: Advanced heart failure. Having symptoms that interfere with daily life and frequent hospitalizations related to heart failure. What are the causes? This condition may be caused by: High blood pressure (hypertension). Hypertension causes the heart muscle to work harder than normal. Coronary artery disease, or CAD. CAD is the buildup of cholesterol and fat (plaque) in the arteries of the heart. Heart attack, also called myocardial infarction. This injures the heart muscle, making it hard for the heart to pump blood. Abnormal heart valves. The valves do not open and close properly, forcing the heart to pump harder to keep the blood flowing. Heart muscle disease, inflammation, or infection (cardiomyopathy or myocarditis). This is damage to the heart muscle. It can increase the risk of heart failure. Lung disease. The heart works harder when the lungs are not healthy. What increases the risk? The risk  of heart failure increases as a person ages. This condition is also more likely to develop in people who: Are obese. Use tobacco or nicotine products. Abuse alcohol or drugs. Have taken medicines that can damage the heart, such as chemotherapy drugs. Have any of these conditions: Diabetes. Abnormal heart rhythms. Thyroid problems. Low blood counts (anemia). Chronic kidney disease. Have a family history of heart failure. What are the signs or symptoms? Symptoms of this condition include: Shortness of breath with activity, such as when climbing stairs. A cough that does not go away. Swelling of the feet, ankles, legs, or abdomen. Losing or gaining weight for no reason. Trouble breathing when lying flat. Waking from sleep because of the need to sit up and get more air. Rapid heartbeat. Other symptoms may include: Tiredness (fatigue) and loss of energy. Feeling light-headed, dizzy, or close to fainting. Nausea or loss of appetite. Waking up more often during the night to urinate (nocturia). Confusion. How is this diagnosed? This condition is diagnosed based on: Your medical history, symptoms, and a physical exam. Blood tests. Diagnostic tests, which may include: Echocardiogram. Electrocardiogram (ECG). Chest X-ray. Exercise stress test. Cardiac MRI. Cardiac catheterization and angiogram. Radionuclide scans. How is this treated? Treatment for this condition is aimed at managing the symptoms of heart failure. Medicines Treatment may include medicines that: Help lower blood pressure by relaxing (dilating) the blood vessels. These medicines are called ACE inhibitors (angiotensin-converting enzyme), ARBs (angiotensin receptor blockers), or vasodilators. Cause the kidneys to remove salt and water from the blood through urination (diuretics). Improve heart muscle strength and prevent the heart from beating too fast (beta blockers). Increase the   force of the heartbeat  (digoxin). Lower heart rates. Certain diabetes medicines (SGLT-2 inhibitors) may also be used in treatment. Healthy behavior changes Treatment may also include making healthy lifestyle changes, such as: Reaching and staying at a healthy weight. Not using tobacco or nicotine products. Eating heart-healthy foods. Limiting or avoiding alcohol. Stopping the use of illegal drugs. Being physically active. Participating in a cardiac rehabilitation program, which is a treatment program to improve your health and well-being through exercise training, education, and counseling. Other treatments Other treatments may include: Procedures to open blocked arteries or repair damaged valves. Placing a pacemaker to improve heart function (cardiac resynchronization therapy). Placing a device to treat serious abnormal heart rhythms (implantable cardioverter defibrillator, or ICD). Placing a device to improve the pumping ability of the heart (left ventricular assist device, or LVAD). Receiving a healthy heart from a donor (heart transplant). This is done when other treatments have not helped. Follow these instructions at home: Manage other health conditions as told by your health care provider. These may include hypertension, diabetes, thyroid disease, or abnormal heart rhythms. Get ongoing education and support as needed. Learn as much as you can about heart failure. Keep all follow-up visits. This is important. Where to find more information American Heart Association: www.heart.org Centers for Disease Control and Prevention: www.cdc.gov NIH National Institute on Aging: www.nia.nih.gov Summary Heart failure is a condition in which the heart has trouble pumping blood. This condition is commonly caused by high blood pressure and other diseases of the heart and lungs. Symptoms of this condition include shortness of breath, tiredness (fatigue), nausea, and swelling of the feet, ankles, legs, or  abdomen. Treatments for this condition may include medicines, lifestyle changes, and surgery. Manage other health conditions as told by your health care provider. This information is not intended to replace advice given to you by your health care provider. Make sure you discuss any questions you have with your health care provider. Document Revised: 11/23/2021 Document Reviewed: 03/07/2020 Elsevier Patient Education  2023 [employer]  

## 2022-08-01 ENCOUNTER — Encounter: Payer: Self-pay | Admitting: Internal Medicine

## 2022-08-01 DIAGNOSIS — N183 Chronic kidney disease, stage 3 unspecified: Secondary | ICD-10-CM

## 2022-08-01 DIAGNOSIS — I5042 Chronic combined systolic (congestive) and diastolic (congestive) heart failure: Secondary | ICD-10-CM

## 2022-08-01 DIAGNOSIS — E118 Type 2 diabetes mellitus with unspecified complications: Secondary | ICD-10-CM

## 2022-08-02 MED ORDER — EMPAGLIFLOZIN 10 MG PO TABS: 10.0000 mg | ORAL_TABLET | Freq: Every day | ORAL | 0 refills | Status: DC

## 2022-08-10 DIAGNOSIS — H26491 Other secondary cataract, right eye: Secondary | ICD-10-CM | POA: Diagnosis not present

## 2022-08-10 DIAGNOSIS — H353231 Exudative age-related macular degeneration, bilateral, with active choroidal neovascularization: Secondary | ICD-10-CM | POA: Diagnosis not present

## 2022-08-10 DIAGNOSIS — H43813 Vitreous degeneration, bilateral: Secondary | ICD-10-CM | POA: Diagnosis not present

## 2022-08-25 DIAGNOSIS — H353221 Exudative age-related macular degeneration, left eye, with active choroidal neovascularization: Secondary | ICD-10-CM | POA: Diagnosis not present

## 2022-08-26 DIAGNOSIS — M545 Low back pain, unspecified: Secondary | ICD-10-CM | POA: Diagnosis not present

## 2022-08-26 DIAGNOSIS — G8929 Other chronic pain: Secondary | ICD-10-CM | POA: Diagnosis not present

## 2022-08-26 DIAGNOSIS — M47816 Spondylosis without myelopathy or radiculopathy, lumbar region: Secondary | ICD-10-CM | POA: Diagnosis not present

## 2022-09-06 ENCOUNTER — Ambulatory Visit: Payer: Medicare Other | Admitting: Internal Medicine

## 2022-09-07 ENCOUNTER — Encounter: Payer: Self-pay | Admitting: Internal Medicine

## 2022-09-07 ENCOUNTER — Ambulatory Visit (INDEPENDENT_AMBULATORY_CARE_PROVIDER_SITE_OTHER): Payer: Medicare Other | Admitting: Internal Medicine

## 2022-09-07 VITALS — BP 122/72 | HR 87 | Temp 98.1°F | Ht 69.0 in | Wt 189.0 lb

## 2022-09-07 DIAGNOSIS — M5441 Lumbago with sciatica, right side: Secondary | ICD-10-CM | POA: Insufficient documentation

## 2022-09-07 DIAGNOSIS — M5442 Lumbago with sciatica, left side: Secondary | ICD-10-CM | POA: Diagnosis not present

## 2022-09-07 DIAGNOSIS — E538 Deficiency of other specified B group vitamins: Secondary | ICD-10-CM | POA: Diagnosis not present

## 2022-09-07 DIAGNOSIS — E559 Vitamin D deficiency, unspecified: Secondary | ICD-10-CM

## 2022-09-07 MED ORDER — METHYLPREDNISOLONE 4 MG PO TBPK: ORAL_TABLET | ORAL | 0 refills | Status: DC

## 2022-09-07 MED ORDER — HYDROCODONE-ACETAMINOPHEN 7.5-325 MG PO TABS: 1.0000 | ORAL_TABLET | Freq: Four times a day (QID) | ORAL | 0 refills | Status: DC | PRN

## 2022-09-07 NOTE — Patient Instructions (Signed)
Blue-Emu cream -- use 2-3 times a day ? ?

## 2022-09-07 NOTE — Assessment & Plan Note (Signed)
Take Vit D

## 2022-09-07 NOTE — Assessment & Plan Note (Addendum)
Worse L>R Likely spinal stenosis of the lumbar spine with severe pain in the lower back irradiating to both legs over a couple weeks duration.  He was seen  at [facility] urgent [facility] and was prescribed Norco to use as needed.  Norco helps for a [place] while.  He rates his pain at 10 out of 10 at times.  He is much less mobile.  He has to use a walker at home.  The patient had a lumbar spine x-ray in November.  No urinary trouble.  No flank pain  Prescribed prednisone Dosepak.  It may affect his glucose control temporarily.  They are aware. Norco 7.5/325 to use as needed as little as possible.  Constipation prophylaxis.  Fall prevention Follow-up with Dr. Ronnald Ramp in 7-10 days He may be radiated to start physical therapy at home at that time.

## 2022-09-07 NOTE — Progress Notes (Signed)
Subjective:  Patient ID: Nicholas Burton, male    DOB: 08-13-1928  Age: 89 y.o. MRN: [id number]  CC: No chief complaint on file.   HPI Nicholas Burton presents for severe pain in the lower back irradiating to both legs over a couple weeks duration.  He was seen  at [facility] urgent [facility] and was prescribed Norco to use as needed.  Norco helps for a [place] while.  He rates his pain at 10 out of 10 at times.  He is much less mobile.  He has to use a walker at home.  The patient had a lumbar spine x-ray in November.  No urinary trouble.  No flank pain  He is here with his wife and his son   Outpatient Medications Prior to Visit  Medication Sig Dispense Refill   atorvastatin (LIPITOR) 10 MG tablet Take 1 tablet (10 mg total) by mouth daily. 90 tablet 3   B-D ULTRAFINE III [place] PEN 31G X 8 MM MISC USE TO INJECT INSULIN DAILY 300 each 3   Blood Glucose Monitoring Suppl (FREESTYLE FREEDOM LITE) W/DEVICE KIT Use to check blood sugars daily Dx E11.8 1 each 0   cholecalciferol (VITAMIN D) 1000 units tablet Take 1,000 Units by mouth daily.     Colesevelam HCl 3.75 g PACK MIX AND DRINK 1 PACKET DAILY 90 packet 0   Continuous Blood Gluc Receiver (FREESTYLE LIBRE 2 READER) DEVI 1 Act by Does not apply route daily. 2 each 5   Continuous Blood Gluc Sensor (FREESTYLE LIBRE 2 SENSOR) MISC 1 Act by Does not apply route daily. 2 each 5   Dietary Management Product (VASCULERA) TABS Take 1 tablet by mouth daily. 30 tablet 11   empagliflozin (JARDIANCE) 10 MG TABS tablet Take 1 tablet (10 mg total) by mouth daily before breakfast. 90 tablet 0   FREESTYLE LITE test strip USE AS INSTRUCTED TO CHECK BLOOD SUGAR TWICE A DAY 200 strip 3   Glucagon (GVOKE HYPOPEN 2-PACK) 1 MG/0.2ML SOAJ Inject 1 Act into the skin daily as needed. 2 mL 6   Lancets (FREESTYLE) lancets Use to check blood sugars twice day Dx E11.8 180 each 3   LANTUS SOLOSTAR 100 UNIT/ML Solostar Pen INJECT 0.3ML (30 UNITS TOTAL) INTO THE SKIN AT BEDTIME 27 mL 1    losartan (COZAAR) 25 MG tablet Take 1 tablet (25 mg total) by mouth daily. 90 tablet 3   metFORMIN (GLUCOPHAGE) 500 MG tablet TAKE 1 TABLET TWICE A DAY WITH MEALS 180 tablet 0   methocarbamol (ROBAXIN) 500 MG tablet      metoprolol tartrate (LOPRESSOR) 25 MG tablet TAKE ONE-HALF (1/2) TABLET BY MOUTH TWICE A DAY 90 tablet 3   Multiple Vitamins-Minerals (PRESERVISION/LUTEIN) CAPS Take 1 capsule by mouth 2 (two) times daily.     nabumetone (RELAFEN) 750 MG tablet      torsemide (DEMADEX) 10 MG tablet Take 1 tablet (10 mg total) by mouth daily. Please call our office to schedule a yearly follow up for future refills. [phone]. Thank you (First attempt) 30 tablet 0   Vibegron (GEMTESA) 75 MG TABS TAKE 1 TABLET DAILY 90 tablet 1   vitamin B-12 (CYANOCOBALAMIN) 500 MCG tablet Take 500 mcg by mouth daily.     HYDROcodone-acetaminophen (NORCO/VICODIN) 5-325 MG tablet 1 tablet.     No facility-administered medications prior to visit.    ROS: Review of Systems  Constitutional:  Positive for fatigue. Negative for appetite change and unexpected weight change.  HENT:  Negative  for congestion, nosebleeds, sneezing, sore throat and trouble swallowing.   Eyes:  Negative for itching and visual disturbance.  Respiratory:  Negative for cough.   Cardiovascular:  Negative for chest pain, palpitations and leg swelling.  Gastrointestinal:  Negative for abdominal distention, blood in stool, diarrhea and nausea.  Genitourinary:  Negative for frequency and hematuria.  Musculoskeletal:  Positive for arthralgias, back pain and gait problem. Negative for joint swelling and neck pain.  Skin:  Negative for rash.  Neurological:  Positive for weakness. Negative for dizziness, tremors and speech difficulty.  Psychiatric/Behavioral:  Negative for agitation, dysphoric mood and sleep disturbance. The patient is not nervous/anxious.     Objective:  BP 122/72 (BP Location: Right Arm, Patient Position: Sitting, Cuff  Size: Normal)   Pulse 87   Temp 98.1 F (36.7 C) (Oral)   Ht '5\' 9"'$  (1.753 m)   Wt 189 lb (85.7 kg)   SpO2 96%   BMI 27.91 kg/m   BP Readings from Last 3 Encounters:  09/07/22 122/72  07/26/22 138/72  04/25/22 116/60    Wt Readings from Last 3 Encounters:  09/07/22 189 lb (85.7 kg)  07/26/22 188 lb (85.3 kg)  07/11/22 189 lb (85.7 kg)    Physical Exam Constitutional:      General: He is not in acute distress.    Appearance: He is well-developed. He is obese.     Comments: NAD  Eyes:     Conjunctiva/sclera: Conjunctivae normal.     Pupils: Pupils are equal, round, and reactive to light.  Neck:     Thyroid: No thyromegaly.     Vascular: No JVD.  Cardiovascular:     Rate and Rhythm: Normal rate and regular rhythm.     Heart sounds: Normal heart sounds. No murmur heard.    No friction rub. No gallop.  Pulmonary:     Effort: Pulmonary effort is normal. No respiratory distress.     Breath sounds: Normal breath sounds. No wheezing or rales.  Chest:     Chest wall: No tenderness.  Abdominal:     General: Bowel sounds are normal. There is no distension.     Palpations: Abdomen is soft. There is no mass.     Tenderness: There is no abdominal tenderness. There is no guarding or rebound.  Musculoskeletal:        General: Tenderness present. Normal range of motion.     Cervical back: Normal range of motion.     Right lower leg: Edema present.     Left lower leg: Edema present.  Lymphadenopathy:     Cervical: No cervical adenopathy.  Skin:    General: Skin is warm and dry.     Findings: No rash.  Neurological:     Mental Status: He is alert and oriented to person, place, and time.     Cranial Nerves: No cranial nerve deficit.     Motor: No abnormal muscle tone.     Coordination: Coordination normal.     Gait: Gait normal.     Deep Tendon Reflexes: Reflexes are normal and symmetric.  Psychiatric:        Behavior: Behavior normal.        Thought Content: Thought  content normal.        Judgment: Judgment normal.    She is in a wheelchair.  Standing up and walking with difficulty.  He is stiff.  He is hard hearing. Lumbar spine is painful with range of motion Straight leg elevation  test is difficult to check, grossly normal  Lab Results  Component Value Date   WBC 11.9 (H) 07/26/2022   HGB 12.9 (L) 07/26/2022   HCT 38.7 (L) 07/26/2022   PLT 278.0 07/26/2022   GLUCOSE 180 (H) 07/26/2022   CHOL 83 09/17/2019   TRIG 136.0 09/17/2019   HDL 36.40 (L) 09/17/2019   LDLCALC 19 09/17/2019   ALT 16 07/26/2022   AST 19 07/26/2022   NA 140 07/26/2022   K 3.5 07/26/2022   CL 103 07/26/2022   CREATININE 1.34 07/26/2022   BUN 17 07/26/2022   CO2 31 07/26/2022   TSH 3.88 07/26/2022   PSA 11.21 (H) 10/30/2017   INR 0.9 12/28/2021   HGBA1C 8.0 (H) 07/26/2022   MICROALBUR 0.7 09/17/2019    DG Chest 2 View  Result Date: 05/16/2019 CLINICAL DATA:  Cough and wheezing for 2 days. EXAM: CHEST - 2 VIEW COMPARISON:  02/05/2016. FINDINGS: Cardiac silhouette is normal in size. No mediastinal or hilar masses. No evidence of adenopathy. Clear lungs.  No pleural effusion or pneumothorax. Skeletal structures are intact. IMPRESSION: No active cardiopulmonary disease. Electronically Signed   By: Lajean Manes M.D.   On: 05/16/2019 14:38    Assessment & Plan:   Problem List Items Addressed This Visit       Nervous and Auditory   Low back pain due to bilateral sciatica    Worse L>R Likely spinal stenosis of the lumbar spine with severe pain in the lower back irradiating to both legs over a couple weeks duration.  He was seen  at [facility] urgent [facility] and was prescribed Norco to use as needed.  Norco helps for a [place] while.  He rates his pain at 10 out of 10 at times.  He is much less mobile.  He has to use a walker at home.  The patient had a lumbar spine x-ray in November.  No urinary trouble.  No flank pain  Prescribed prednisone Dosepak.  It may affect his  glucose control temporarily.  They are aware. Norco 7.5/325 to use as needed as little as possible.  Constipation prophylaxis.  Fall prevention Follow-up with Dr. Ronnald Ramp in 7-10 days He may be radiated to start physical therapy at home at that time.      Relevant Medications   nabumetone (RELAFEN) 750 MG tablet   methocarbamol (ROBAXIN) 500 MG tablet   methylPREDNISolone (MEDROL DOSEPAK) 4 MG TBPK tablet   HYDROcodone-acetaminophen (NORCO) 7.5-325 MG tablet     Other   Vitamin D deficiency    Take Vit D      B12 deficiency - Primary      Meds ordered this encounter  Medications   methylPREDNISolone (MEDROL DOSEPAK) 4 MG TBPK tablet    Sig: As directed    Dispense:  21 tablet    Refill:  0   HYDROcodone-acetaminophen (NORCO) 7.5-325 MG tablet    Sig: Take 1 tablet by mouth every 6 (six) hours as needed for moderate pain.    Dispense:  60 tablet    Refill:  0      Follow-up: Return in about 2 weeks (around 09/21/2022) for a follow-up visit.  Walker Kehr, MD

## 2022-09-20 ENCOUNTER — Other Ambulatory Visit: Payer: Self-pay | Admitting: Internal Medicine

## 2022-09-20 DIAGNOSIS — E118 Type 2 diabetes mellitus with unspecified complications: Secondary | ICD-10-CM

## 2022-09-20 DIAGNOSIS — E119 Type 2 diabetes mellitus without complications: Secondary | ICD-10-CM

## 2022-09-21 ENCOUNTER — Encounter: Payer: Self-pay | Admitting: Internal Medicine

## 2022-09-21 ENCOUNTER — Ambulatory Visit (INDEPENDENT_AMBULATORY_CARE_PROVIDER_SITE_OTHER): Payer: Medicare Other | Admitting: Internal Medicine

## 2022-09-21 VITALS — BP 118/62 | HR 77 | Temp 98.1°F | Ht 69.0 in | Wt 170.0 lb

## 2022-09-21 DIAGNOSIS — F0280 Dementia in other diseases classified elsewhere without behavioral disturbance: Secondary | ICD-10-CM | POA: Diagnosis not present

## 2022-09-21 DIAGNOSIS — G301 Alzheimer's disease with late onset: Secondary | ICD-10-CM | POA: Diagnosis not present

## 2022-09-21 DIAGNOSIS — M159 Polyosteoarthritis, unspecified: Secondary | ICD-10-CM | POA: Diagnosis not present

## 2022-09-21 DIAGNOSIS — M150 Primary generalized (osteo)arthritis: Secondary | ICD-10-CM

## 2022-09-21 DIAGNOSIS — M48062 Spinal stenosis, lumbar region with neurogenic claudication: Secondary | ICD-10-CM | POA: Diagnosis not present

## 2022-09-21 DIAGNOSIS — Z515 Encounter for palliative care: Secondary | ICD-10-CM

## 2022-09-21 MED ORDER — HYDROCODONE-ACETAMINOPHEN 5-325 MG PO TABS: 1.0000 | ORAL_TABLET | Freq: Four times a day (QID) | ORAL | 0 refills | Status: DC | PRN

## 2022-09-21 NOTE — Progress Notes (Signed)
Subjective:  Patient ID: Nicholas Burton, male    DOB: 1927/10/03  Age: 89 y.o. MRN: [id number]  CC: Back Pain and Osteoarthritis   HPI Nicholas Burton presents for f/up -  He is with his wife and son today.  They tell me that he is declining.  He continues to complain of low back pain radiating to his legs with lower extremity weakness.  He is in a wheelchair now and cannot walk many steps.  He is also experiencing loss of appetite and weight loss.  Norco 7.5 was recently prescribed but he is concerned the dose was too high so he wants the dose to be lowered.  Outpatient Medications Prior to Visit  Medication Sig Dispense Refill   atorvastatin (LIPITOR) 10 MG tablet Take 1 tablet (10 mg total) by mouth daily. 90 tablet 3   B-D ULTRAFINE III [place] PEN 31G X 8 MM MISC USE TO INJECT INSULIN DAILY 300 each 3   Blood Glucose Monitoring Suppl (FREESTYLE FREEDOM LITE) W/DEVICE KIT Use to check blood sugars daily Dx E11.8 1 each 0   cholecalciferol (VITAMIN D) 1000 units tablet Take 1,000 Units by mouth daily.     Colesevelam HCl 3.75 g PACK MIX AND DRINK 1 PACKET DAILY 90 packet 0   Continuous Blood Gluc Receiver (FREESTYLE LIBRE 2 READER) DEVI 1 Act by Does not apply route daily. 2 each 5   Continuous Blood Gluc Sensor (FREESTYLE LIBRE 2 SENSOR) MISC 1 Act by Does not apply route daily. 2 each 5   Dietary Management Product (VASCULERA) TABS Take 1 tablet by mouth daily. 30 tablet 11   empagliflozin (JARDIANCE) 10 MG TABS tablet Take 1 tablet (10 mg total) by mouth daily before breakfast. 90 tablet 0   FREESTYLE LITE test strip USE AS INSTRUCTED TO CHECK BLOOD SUGAR TWICE A DAY 200 strip 3   Glucagon (GVOKE HYPOPEN 2-PACK) 1 MG/0.2ML SOAJ Inject 1 Act into the skin daily as needed. 2 mL 6   Lancets (FREESTYLE) lancets Use to check blood sugars twice day Dx E11.8 180 each 3   LANTUS SOLOSTAR 100 UNIT/ML Solostar Pen INJECT 0.3ML (30 UNITS TOTAL) INTO THE SKIN AT BEDTIME 27 mL 1   losartan (COZAAR)  25 MG tablet Take 1 tablet (25 mg total) by mouth daily. 90 tablet 3   metFORMIN (GLUCOPHAGE) 500 MG tablet TAKE 1 TABLET TWICE A DAY WITH MEALS 180 tablet 0   methocarbamol (ROBAXIN) 500 MG tablet      metoprolol tartrate (LOPRESSOR) 25 MG tablet TAKE ONE-HALF (1/2) TABLET BY MOUTH TWICE A DAY 90 tablet 3   Multiple Vitamins-Minerals (PRESERVISION/LUTEIN) CAPS Take 1 capsule by mouth 2 (two) times daily.     nabumetone (RELAFEN) 750 MG tablet      torsemide (DEMADEX) 10 MG tablet Take 1 tablet (10 mg total) by mouth daily. Please call our office to schedule a yearly follow up for future refills. [phone]. Thank you (First attempt) 30 tablet 0   Vibegron (GEMTESA) 75 MG TABS TAKE 1 TABLET DAILY 90 tablet 1   vitamin B-12 (CYANOCOBALAMIN) 500 MCG tablet Take 500 mcg by mouth daily.     HYDROcodone-acetaminophen (NORCO) 7.5-325 MG tablet Take 1 tablet by mouth every 6 (six) hours as needed for moderate pain. 60 tablet 0   methylPREDNISolone (MEDROL DOSEPAK) 4 MG TBPK tablet As directed 21 tablet 0   No facility-administered medications prior to visit.    ROS Review of Systems  Constitutional:  Positive for  activity change, appetite change, fatigue and unexpected weight change. Negative for diaphoresis and fever.  HENT:  Positive for hearing loss.   Eyes:  Positive for visual disturbance.  Respiratory:  Negative for cough, chest tightness, shortness of breath and wheezing.   Cardiovascular:  Negative for chest pain, palpitations and leg swelling.  Gastrointestinal:  Negative for abdominal pain, diarrhea, nausea and vomiting.  Endocrine: Positive for polyuria.  Genitourinary:  Positive for frequency. Negative for flank pain.  Musculoskeletal:  Positive for arthralgias and back pain. Negative for myalgias.  Skin: Negative.   Neurological: Negative.  Negative for dizziness and weakness.  Hematological:  Negative for adenopathy. Does not bruise/bleed easily.  Psychiatric/Behavioral:   Positive for confusion and decreased concentration. Negative for agitation, behavioral problems, self-injury, sleep disturbance and suicidal ideas. The patient is not nervous/anxious.     Objective:  BP 118/62 (BP Location: Right Arm, Patient Position: Sitting, Cuff Size: Large)   Pulse 77   Temp 98.1 F (36.7 C) (Oral)   Ht '5\' 9"'$  (1.753 m)   Wt 170 lb (77.1 kg)   SpO2 96%   BMI 25.10 kg/m   BP Readings from Last 3 Encounters:  09/21/22 118/62  09/07/22 122/72  07/26/22 138/72    Wt Readings from Last 3 Encounters:  09/21/22 170 lb (77.1 kg)  09/07/22 189 lb (85.7 kg)  07/26/22 188 lb (85.3 kg)    Physical Exam Vitals reviewed.  Constitutional:      General: He is not in acute distress.    Appearance: He is ill-appearing (in a wheelchair). He is not toxic-appearing or diaphoretic.  HENT:     Mouth/Throat:     Mouth: Mucous membranes are moist.  Eyes:     General: No scleral icterus.    Conjunctiva/sclera: Conjunctivae normal.  Cardiovascular:     Rate and Rhythm: Normal rate and regular rhythm.     Heart sounds: No murmur heard. Pulmonary:     Effort: Pulmonary effort is normal.     Breath sounds: No stridor. Rhonchi present. No wheezing or rales.  Chest:     Chest wall: No tenderness.  Abdominal:     General: Abdomen is flat.     Palpations: There is no mass.     Tenderness: There is no abdominal tenderness. There is no guarding.     Hernia: No hernia is present.  Musculoskeletal:        General: Normal range of motion.     Cervical back: Neck supple.     Right lower leg: No edema.     Left lower leg: No edema.  Lymphadenopathy:     Cervical: No cervical adenopathy.  Skin:    General: Skin is warm.  Neurological:     Mental Status: He is alert.     Motor: Weakness present.     Coordination: Coordination abnormal.     Gait: Gait abnormal.     Deep Tendon Reflexes: Reflexes normal.  Psychiatric:        Attention and Perception: He is inattentive.         Speech: Speech is delayed and tangential.        Behavior: Behavior is slowed. Behavior is not agitated. Behavior is cooperative.        Thought Content: Thought content is not paranoid or delusional. Thought content does not include homicidal or suicidal ideation. Thought content does not include suicidal plan.        Cognition and Memory: Cognition is impaired. Memory is  impaired. He exhibits impaired recent memory and impaired remote memory.     Lab Results  Component Value Date   WBC 11.9 (H) 07/26/2022   HGB 12.9 (L) 07/26/2022   HCT 38.7 (L) 07/26/2022   PLT 278.0 07/26/2022   GLUCOSE 180 (H) 07/26/2022   CHOL 83 09/17/2019   TRIG 136.0 09/17/2019   HDL 36.40 (L) 09/17/2019   LDLCALC 19 09/17/2019   ALT 16 07/26/2022   AST 19 07/26/2022   NA 140 07/26/2022   K 3.5 07/26/2022   CL 103 07/26/2022   CREATININE 1.34 07/26/2022   BUN 17 07/26/2022   CO2 31 07/26/2022   TSH 3.88 07/26/2022   PSA 11.21 (H) 10/30/2017   INR 0.9 12/28/2021   HGBA1C 8.0 (H) 07/26/2022   MICROALBUR 0.7 09/17/2019    DG Chest 2 View  Result Date: 05/16/2019 CLINICAL DATA:  Cough and wheezing for 2 days. EXAM: CHEST - 2 VIEW COMPARISON:  02/05/2016. FINDINGS: Cardiac silhouette is normal in size. No mediastinal or hilar masses. No evidence of adenopathy. Clear lungs.  No pleural effusion or pneumothorax. Skeletal structures are intact. IMPRESSION: No active cardiopulmonary disease. Electronically Signed   By: Lajean Manes M.D.   On: 05/16/2019 14:38    Assessment & Plan:   Nicholas Burton was seen today for back pain and osteoarthritis.  Diagnoses and all orders for this visit:  Late onset Alzheimer's dementia without behavioral disturbance ([place]) -     Amb Referral to Palliative Care  Primary osteoarthritis involving multiple joints -     HYDROcodone-acetaminophen (NORCO/VICODIN) 5-325 MG tablet; Take 1 tablet by mouth every 6 (six) hours as needed for moderate pain.  Spinal stenosis of lumbar  region with neurogenic claudication- He wishes not to undergo a surgical intervention.  Will continue to control the pain. -     HYDROcodone-acetaminophen (NORCO/VICODIN) 5-325 MG tablet; Take 1 tablet by mouth every 6 (six) hours as needed for moderate pain.  Encounter for palliative care involving management of pain -     HYDROcodone-acetaminophen (NORCO/VICODIN) 5-325 MG tablet; Take 1 tablet by mouth every 6 (six) hours as needed for moderate pain.   I have discontinued [name]. [place]'s methylPREDNISolone and HYDROcodone-acetaminophen. I am also having him start on HYDROcodone-acetaminophen. Additionally, I am having him maintain his PreserVision/Lutein, FreeStyle Freedom Lite, freestyle, cyanocobalamin, cholecalciferol, Gvoke HypoPen 2-Pack, FREESTYLE LITE, FreeStyle Libre 2 Sensor, [employer] 2 Reader, metoprolol tartrate, losartan, atorvastatin, Lantus SoloStar, B-D ULTRAFINE III [place] PEN, Vasculera, Colesevelam HCl, Gemtesa, torsemide, empagliflozin, nabumetone, methocarbamol, and metFORMIN.  Meds ordered this encounter  Medications   HYDROcodone-acetaminophen (NORCO/VICODIN) 5-325 MG tablet    Sig: Take 1 tablet by mouth every 6 (six) hours as needed for moderate pain.    Dispense:  120 tablet    Refill:  0     Follow-up: No follow-ups on file.  Scarlette Calico, MD

## 2022-09-26 ENCOUNTER — Other Ambulatory Visit: Payer: Self-pay | Admitting: Interventional Cardiology

## 2022-09-26 DIAGNOSIS — E118 Type 2 diabetes mellitus with unspecified complications: Secondary | ICD-10-CM

## 2022-09-26 DIAGNOSIS — E785 Hyperlipidemia, unspecified: Secondary | ICD-10-CM

## 2022-09-28 ENCOUNTER — Other Ambulatory Visit: Payer: Medicare Other

## 2022-09-28 VITALS — BP 134/70 | HR 72 | Temp 97.0°F

## 2022-09-28 DIAGNOSIS — Z515 Encounter for palliative care: Secondary | ICD-10-CM

## 2022-09-29 NOTE — Progress Notes (Signed)
COMMUNITY PALLIATIVE CARE SW NOTE  PATIENT NAME: Nicholas Burton DOB: 19-Jul-1928 MRN: [id number]  PRIMARY CARE PROVIDER: [name], MD  RESPONSIBLE PARTY:  Acct ID - Guarantor Home Phone Work Phone Relationship Acct Type  [id number] [name], [name] [phone]  Self P/F     [address], [place], [place] [address]   Initial Palliative Care Social Work Visit  Palliative Care SW and LPN-D. [name] completed an initial palliative care visit with patient at his home. His wife-Joan and son-Andrew was present for this visit. The patient and family was provided education to the family regarding palliative care services, visit frequency and role in patient's care. Patient signed the consent form, giving consent to ongoing services.   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 He ambulates with a walker. He has weakness in his left leg. He has age-related dementia. Patient is asthmatic, sleep apnea. His wife report some shortness of breath where he requires rest breaks in between activity or ambulation. Patient has spinal stenosis and have pain in his back. He takes Hydrocodone for pain every 6 hours.  Patient's appetite is poor overall. He eats 2 smaller meals a day (Breakfast, protein shake or snack and small dinner). He has the [place] 2 to monitor his blood sugars. Patient's A1C is running around 8. Patient is not sleeping through the night due to using the bathroom 3-4 times or getting a snack due to low sugar. Patient has mild swelling in his hands and feet. Patient had a fall 3 weeks ago (1/11), but had fall 3 times in the last 3 months without injuries.   Patient's goal is for patient to remain in his home, get stronger and receive physical therapy; family desires in-home care-SW encouraged family to seek assistance through [place] as patient is retired Nature conservation officer. Patient desires to remain a [place]. The family is in process of completing advance directives.      SOCIAL HX: Patient born and raised in [place]. He has an appointment in [place], [place]. He is a [place]. Married since 1949.  Patient has four sons. Patient's sons [name] and Vicente Serene handle patient's finances. [place] History   Tobacco Use   Smoking status: Never   Smokeless tobacco: Never  Substance Use Topics   Alcohol use: No    CODE STATUS: Full Code ADVANCED DIRECTIVES: In progress MOST FORM COMPLETE: No HOSPICE EDUCATION PROVIDED: Yes  Duration of visit and documentation: 60 minutes.  [address] [place], [place]

## 2022-09-30 NOTE — Progress Notes (Signed)
PATIENT NAME: Nicholas Burton DOB: [birth year] MRN: [id number]  PRIMARY CARE PROVIDER: [name], MD  RESPONSIBLE PARTY:  Acct ID - Guarantor Home Phone Work Phone Relationship Acct Type  [id number] Nicholas Burton [phone]  Self P/F     [address], [place], [place] [address]    Palliative Care Initial Encounter Note   Completed home visit with Nicholas Burton, SW. Son Nicholas Burton and wife Nicholas Burton also present. 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 3 weeks ago without injury   Sleeping Pattern: awake at night d/t frequent urination from diuretic and sometimes low BS; he'll usually eat a snack at night before bed  Respiratory: has SOB on exertion; chooses not to use nebulizer  Pain: currently takes Norco 5/325; pain well managed at this time  Palliative Care/ Hospice: LPN explained role and purpose of palliative care including visit frequency. Also discussed benefits of hospice care as well as the differences between the two with patient. Gave Dementia booklet; discussed the different types of Dementia   Goals of Care: Stay in the home with care until death; wants [place] for strength; wants assist in the home w/ADLs   CODE STATUS: Full Code  ADVANCED DIRECTIVES: N MOST FORM: No PPS: 50%   PHYSICAL EXAM:   VITALS: Today's Vitals   09/28/22 1535  BP: 134/70  Pulse: 72  Temp: (!) 97 F (36.1 C)  TempSrc: Temporal  SpO2: 95%  PainSc: 0-No pain    LUNGS:  bilateral expiratory wheezing CARDIAC: Cor RRR EXTREMITIES: 1+ and pitting edema SKIN: Skin color, texture, turgor normal. No rashes or lesions  NEURO: negative       Nicholas Burton Housekeeper, LPN

## 2022-10-04 ENCOUNTER — Encounter: Payer: Self-pay | Admitting: Internal Medicine

## 2022-10-05 ENCOUNTER — Other Ambulatory Visit: Payer: Self-pay

## 2022-10-05 ENCOUNTER — Emergency Department (HOSPITAL_COMMUNITY): Payer: Medicare Other

## 2022-10-05 ENCOUNTER — Inpatient Hospital Stay (HOSPITAL_COMMUNITY)
Admission: EM | Admit: 2022-10-05 | Discharge: 2022-10-14 | DRG: 477 | Disposition: A | Discharge status: Rehabilitation Facility | Payer: Medicare Other | Attending: Internal Medicine | Admitting: Internal Medicine

## 2022-10-05 ENCOUNTER — Encounter (HOSPITAL_COMMUNITY): Payer: Self-pay

## 2022-10-05 DIAGNOSIS — M545 Low back pain, unspecified: Principal | ICD-10-CM

## 2022-10-05 DIAGNOSIS — N179 Acute kidney failure, unspecified: Secondary | ICD-10-CM | POA: Diagnosis not present

## 2022-10-05 DIAGNOSIS — N1831 Chronic kidney disease, stage 3a: Secondary | ICD-10-CM | POA: Diagnosis present

## 2022-10-05 DIAGNOSIS — K5900 Constipation, unspecified: Secondary | ICD-10-CM | POA: Diagnosis not present

## 2022-10-05 DIAGNOSIS — Z79899 Other long term (current) drug therapy: Secondary | ICD-10-CM

## 2022-10-05 DIAGNOSIS — Z809 Family history of malignant neoplasm, unspecified: Secondary | ICD-10-CM

## 2022-10-05 DIAGNOSIS — E1169 Type 2 diabetes mellitus with other specified complication: Secondary | ICD-10-CM | POA: Diagnosis present

## 2022-10-05 DIAGNOSIS — E876 Hypokalemia: Secondary | ICD-10-CM | POA: Diagnosis not present

## 2022-10-05 DIAGNOSIS — E669 Obesity, unspecified: Secondary | ICD-10-CM | POA: Diagnosis not present

## 2022-10-05 DIAGNOSIS — N3289 Other specified disorders of bladder: Secondary | ICD-10-CM | POA: Diagnosis not present

## 2022-10-05 DIAGNOSIS — M898X8 Other specified disorders of bone, other site: Secondary | ICD-10-CM | POA: Diagnosis not present

## 2022-10-05 DIAGNOSIS — C7951 Secondary malignant neoplasm of bone: Secondary | ICD-10-CM | POA: Diagnosis present

## 2022-10-05 DIAGNOSIS — Z833 Family history of diabetes mellitus: Secondary | ICD-10-CM

## 2022-10-05 DIAGNOSIS — I5032 Chronic diastolic (congestive) heart failure: Secondary | ICD-10-CM | POA: Diagnosis not present

## 2022-10-05 DIAGNOSIS — Z515 Encounter for palliative care: Secondary | ICD-10-CM | POA: Diagnosis not present

## 2022-10-05 DIAGNOSIS — Z8249 Family history of ischemic heart disease and other diseases of the circulatory system: Secondary | ICD-10-CM | POA: Diagnosis not present

## 2022-10-05 DIAGNOSIS — E785 Hyperlipidemia, unspecified: Secondary | ICD-10-CM | POA: Diagnosis not present

## 2022-10-05 DIAGNOSIS — N1832 Chronic kidney disease, stage 3b: Secondary | ICD-10-CM | POA: Diagnosis not present

## 2022-10-05 DIAGNOSIS — Z7189 Other specified counseling: Secondary | ICD-10-CM

## 2022-10-05 DIAGNOSIS — Z7984 Long term (current) use of oral hypoglycemic drugs: Secondary | ICD-10-CM

## 2022-10-05 DIAGNOSIS — Z1152 Encounter for screening for COVID-19: Secondary | ICD-10-CM | POA: Diagnosis not present

## 2022-10-05 DIAGNOSIS — I130 Hypertensive heart and chronic kidney disease with heart failure and stage 1 through stage 4 chronic kidney disease, or unspecified chronic kidney disease: Secondary | ICD-10-CM | POA: Diagnosis present

## 2022-10-05 DIAGNOSIS — F0280 Dementia in other diseases classified elsewhere without behavioral disturbance: Secondary | ICD-10-CM | POA: Diagnosis present

## 2022-10-05 DIAGNOSIS — Z66 Do not resuscitate: Secondary | ICD-10-CM | POA: Diagnosis not present

## 2022-10-05 DIAGNOSIS — W19XXXA Unspecified fall, initial encounter: Secondary | ICD-10-CM | POA: Diagnosis not present

## 2022-10-05 DIAGNOSIS — E1136 Type 2 diabetes mellitus with diabetic cataract: Secondary | ICD-10-CM | POA: Diagnosis present

## 2022-10-05 DIAGNOSIS — Z6825 Body mass index (BMI) 25.0-25.9, adult: Secondary | ICD-10-CM | POA: Diagnosis not present

## 2022-10-05 DIAGNOSIS — I110 Hypertensive heart disease with heart failure: Secondary | ICD-10-CM | POA: Diagnosis not present

## 2022-10-05 DIAGNOSIS — F0390 Unspecified dementia without behavioral disturbance: Secondary | ICD-10-CM

## 2022-10-05 DIAGNOSIS — C61 Malignant neoplasm of prostate: Secondary | ICD-10-CM | POA: Diagnosis present

## 2022-10-05 DIAGNOSIS — E1122 Type 2 diabetes mellitus with diabetic chronic kidney disease: Secondary | ICD-10-CM | POA: Diagnosis not present

## 2022-10-05 DIAGNOSIS — Z85820 Personal history of malignant melanoma of skin: Secondary | ICD-10-CM

## 2022-10-05 DIAGNOSIS — E119 Type 2 diabetes mellitus without complications: Secondary | ICD-10-CM | POA: Diagnosis not present

## 2022-10-05 DIAGNOSIS — M4640 Discitis, unspecified, site unspecified: Secondary | ICD-10-CM | POA: Diagnosis present

## 2022-10-05 DIAGNOSIS — C259 Malignant neoplasm of pancreas, unspecified: Secondary | ICD-10-CM | POA: Diagnosis not present

## 2022-10-05 DIAGNOSIS — J449 Chronic obstructive pulmonary disease, unspecified: Secondary | ICD-10-CM | POA: Diagnosis present

## 2022-10-05 DIAGNOSIS — G9340 Encephalopathy, unspecified: Secondary | ICD-10-CM | POA: Diagnosis not present

## 2022-10-05 DIAGNOSIS — Y92239 Unspecified place in hospital as the place of occurrence of the external cause: Secondary | ICD-10-CM | POA: Diagnosis not present

## 2022-10-05 DIAGNOSIS — E1159 Type 2 diabetes mellitus with other circulatory complications: Secondary | ICD-10-CM | POA: Diagnosis not present

## 2022-10-05 DIAGNOSIS — R339 Retention of urine, unspecified: Secondary | ICD-10-CM | POA: Diagnosis not present

## 2022-10-05 DIAGNOSIS — R410 Disorientation, unspecified: Secondary | ICD-10-CM | POA: Diagnosis not present

## 2022-10-05 DIAGNOSIS — J4489 Other specified chronic obstructive pulmonary disease: Secondary | ICD-10-CM | POA: Diagnosis present

## 2022-10-05 DIAGNOSIS — G9341 Metabolic encephalopathy: Secondary | ICD-10-CM | POA: Diagnosis present

## 2022-10-05 DIAGNOSIS — R7989 Other specified abnormal findings of blood chemistry: Secondary | ICD-10-CM | POA: Diagnosis not present

## 2022-10-05 DIAGNOSIS — Z96659 Presence of unspecified artificial knee joint: Secondary | ICD-10-CM | POA: Diagnosis present

## 2022-10-05 DIAGNOSIS — Z741 Need for assistance with personal care: Secondary | ICD-10-CM | POA: Diagnosis not present

## 2022-10-05 DIAGNOSIS — H26491 Other secondary cataract, right eye: Secondary | ICD-10-CM | POA: Diagnosis not present

## 2022-10-05 DIAGNOSIS — M48062 Spinal stenosis, lumbar region with neurogenic claudication: Secondary | ICD-10-CM | POA: Diagnosis present

## 2022-10-05 DIAGNOSIS — E1165 Type 2 diabetes mellitus with hyperglycemia: Secondary | ICD-10-CM | POA: Diagnosis not present

## 2022-10-05 DIAGNOSIS — R4182 Altered mental status, unspecified: Secondary | ICD-10-CM | POA: Diagnosis not present

## 2022-10-05 DIAGNOSIS — J9811 Atelectasis: Secondary | ICD-10-CM | POA: Diagnosis not present

## 2022-10-05 DIAGNOSIS — I700 Atherosclerosis of aorta: Secondary | ICD-10-CM | POA: Diagnosis not present

## 2022-10-05 DIAGNOSIS — R5381 Other malaise: Secondary | ICD-10-CM | POA: Diagnosis not present

## 2022-10-05 DIAGNOSIS — M4626 Osteomyelitis of vertebra, lumbar region: Secondary | ICD-10-CM | POA: Diagnosis present

## 2022-10-05 DIAGNOSIS — R9389 Abnormal findings on diagnostic imaging of other specified body structures: Secondary | ICD-10-CM | POA: Diagnosis not present

## 2022-10-05 DIAGNOSIS — H9190 Unspecified hearing loss, unspecified ear: Secondary | ICD-10-CM | POA: Diagnosis not present

## 2022-10-05 DIAGNOSIS — H43813 Vitreous degeneration, bilateral: Secondary | ICD-10-CM | POA: Diagnosis not present

## 2022-10-05 DIAGNOSIS — G301 Alzheimer's disease with late onset: Secondary | ICD-10-CM | POA: Diagnosis present

## 2022-10-05 DIAGNOSIS — R6510 Systemic inflammatory response syndrome (SIRS) of non-infectious origin without acute organ dysfunction: Secondary | ICD-10-CM | POA: Diagnosis present

## 2022-10-05 DIAGNOSIS — Z885 Allergy status to narcotic agent status: Secondary | ICD-10-CM

## 2022-10-05 DIAGNOSIS — K5730 Diverticulosis of large intestine without perforation or abscess without bleeding: Secondary | ICD-10-CM | POA: Diagnosis not present

## 2022-10-05 DIAGNOSIS — H353231 Exudative age-related macular degeneration, bilateral, with active choroidal neovascularization: Secondary | ICD-10-CM | POA: Diagnosis not present

## 2022-10-05 DIAGNOSIS — Z880 Allergy status to penicillin: Secondary | ICD-10-CM

## 2022-10-05 DIAGNOSIS — Z888 Allergy status to other drugs, medicaments and biological substances status: Secondary | ICD-10-CM

## 2022-10-05 DIAGNOSIS — E782 Mixed hyperlipidemia: Secondary | ICD-10-CM | POA: Diagnosis not present

## 2022-10-05 DIAGNOSIS — Z85828 Personal history of other malignant neoplasm of skin: Secondary | ICD-10-CM

## 2022-10-05 DIAGNOSIS — Z794 Long term (current) use of insulin: Secondary | ICD-10-CM

## 2022-10-05 DIAGNOSIS — I5042 Chronic combined systolic (congestive) and diastolic (congestive) heart failure: Secondary | ICD-10-CM | POA: Diagnosis not present

## 2022-10-05 DIAGNOSIS — N183 Chronic kidney disease, stage 3 unspecified: Secondary | ICD-10-CM | POA: Diagnosis not present

## 2022-10-05 DIAGNOSIS — R102 Pelvic and perineal pain: Secondary | ICD-10-CM | POA: Diagnosis not present

## 2022-10-05 DIAGNOSIS — H6123 Impacted cerumen, bilateral: Secondary | ICD-10-CM | POA: Diagnosis not present

## 2022-10-05 DIAGNOSIS — R600 Localized edema: Secondary | ICD-10-CM | POA: Diagnosis not present

## 2022-10-05 DIAGNOSIS — I509 Heart failure, unspecified: Secondary | ICD-10-CM | POA: Diagnosis not present

## 2022-10-05 DIAGNOSIS — I444 Left anterior fascicular block: Secondary | ICD-10-CM | POA: Diagnosis present

## 2022-10-05 DIAGNOSIS — K8689 Other specified diseases of pancreas: Secondary | ICD-10-CM | POA: Diagnosis not present

## 2022-10-05 DIAGNOSIS — M899 Disorder of bone, unspecified: Secondary | ICD-10-CM | POA: Diagnosis not present

## 2022-10-05 DIAGNOSIS — I10 Essential (primary) hypertension: Secondary | ICD-10-CM | POA: Diagnosis present

## 2022-10-05 DIAGNOSIS — R519 Headache, unspecified: Secondary | ICD-10-CM | POA: Diagnosis not present

## 2022-10-05 DIAGNOSIS — Z9049 Acquired absence of other specified parts of digestive tract: Secondary | ICD-10-CM | POA: Diagnosis not present

## 2022-10-05 DIAGNOSIS — M4646 Discitis, unspecified, lumbar region: Secondary | ICD-10-CM | POA: Diagnosis not present

## 2022-10-05 LAB — URINALYSIS, ROUTINE W REFLEX MICROSCOPIC
Bilirubin Urine: NEGATIVE
Glucose, UA: 50 mg/dL — AB
Hgb urine dipstick: NEGATIVE
Ketones, ur: NEGATIVE mg/dL
Leukocytes,Ua: NEGATIVE
Nitrite: NEGATIVE
Protein, ur: NEGATIVE mg/dL
Specific Gravity, Urine: 1.01 (ref 1.005–1.030)
pH: 5 (ref 5.0–8.0)

## 2022-10-05 LAB — CBC WITH DIFFERENTIAL/PLATELET
Abs Immature Granulocytes: 0.05 10*3/uL (ref 0.00–0.07)
Basophils Absolute: 0 10*3/uL (ref 0.0–0.1)
Basophils Relative: 0 %
Eosinophils Absolute: 0.2 10*3/uL (ref 0.0–0.5)
Eosinophils Relative: 1 %
HCT: 39.1 % (ref 39.0–52.0)
Hemoglobin: 12.9 g/dL — ABNORMAL LOW (ref 13.0–17.0)
Immature Granulocytes: 0 %
Lymphocytes Relative: 24 %
Lymphs Abs: 3.3 10*3/uL (ref 0.7–4.0)
MCH: 29.1 pg (ref 26.0–34.0)
MCHC: 33 g/dL (ref 30.0–36.0)
MCV: 88.3 fL (ref 80.0–100.0)
Monocytes Absolute: 1.9 10*3/uL — ABNORMAL HIGH (ref 0.1–1.0)
Monocytes Relative: 14 %
Neutro Abs: 8.4 10*3/uL — ABNORMAL HIGH (ref 1.7–7.7)
Neutrophils Relative %: 61 %
Platelets: 201 10*3/uL (ref 150–400)
RBC: 4.43 MIL/uL (ref 4.22–5.81)
RDW: 14.9 % (ref 11.5–15.5)
WBC: 13.8 10*3/uL — ABNORMAL HIGH (ref 4.0–10.5)
nRBC: 0 % (ref 0.0–0.2)

## 2022-10-05 LAB — I-STAT VENOUS BLOOD GAS, ED
Acid-base deficit: 2 mmol/L (ref 0.0–2.0)
Bicarbonate: 23.1 mmol/L (ref 20.0–28.0)
Calcium, Ion: 1.19 mmol/L (ref 1.15–1.40)
HCT: 38 % — ABNORMAL LOW (ref 39.0–52.0)
Hemoglobin: 12.9 g/dL — ABNORMAL LOW (ref 13.0–17.0)
O2 Saturation: 48 %
Potassium: 3.3 mmol/L — ABNORMAL LOW (ref 3.5–5.1)
Sodium: 138 mmol/L (ref 135–145)
TCO2: 24 mmol/L (ref 22–32)
pCO2, Ven: 39.6 mmHg — ABNORMAL LOW (ref 44–60)
pH, Ven: 7.375 (ref 7.25–7.43)
pO2, Ven: 27 mmHg — CL (ref 32–45)

## 2022-10-05 LAB — COMPREHENSIVE METABOLIC PANEL
ALT: 21 U/L (ref 0–44)
AST: 24 U/L (ref 15–41)
Albumin: 3.3 g/dL — ABNORMAL LOW (ref 3.5–5.0)
Alkaline Phosphatase: 135 U/L — ABNORMAL HIGH (ref 38–126)
Anion gap: 10 (ref 5–15)
BUN: 15 mg/dL (ref 8–23)
CO2: 22 mmol/L (ref 22–32)
Calcium: 8.8 mg/dL — ABNORMAL LOW (ref 8.9–10.3)
Chloride: 104 mmol/L (ref 98–111)
Creatinine, Ser: 1.35 mg/dL — ABNORMAL HIGH (ref 0.61–1.24)
GFR, Estimated: 49 mL/min — ABNORMAL LOW (ref >60)
Glucose, Bld: 215 mg/dL — ABNORMAL HIGH (ref 70–99)
Potassium: 3.3 mmol/L — ABNORMAL LOW (ref 3.5–5.1)
Sodium: 136 mmol/L (ref 135–145)
Total Bilirubin: 0.8 mg/dL (ref 0.3–1.2)
Total Protein: 5.9 g/dL — ABNORMAL LOW (ref 6.5–8.1)

## 2022-10-05 LAB — RESP PANEL BY RT-PCR (RSV, FLU A&B, COVID)  RVPGX2
Influenza A by PCR: NEGATIVE
Influenza B by PCR: NEGATIVE
Resp Syncytial Virus by PCR: NEGATIVE
SARS Coronavirus 2 by RT PCR: NEGATIVE

## 2022-10-05 LAB — LIPASE, BLOOD: Lipase: 30 U/L (ref 11–51)

## 2022-10-05 LAB — MAGNESIUM: Magnesium: 2.2 mg/dL (ref 1.7–2.4)

## 2022-10-05 LAB — ETHANOL: Alcohol, Ethyl (B): 11 mg/dL — ABNORMAL HIGH (ref <10)

## 2022-10-05 LAB — AMMONIA: Ammonia: <10 umol/L (ref 9–35)

## 2022-10-05 MED ORDER — GADOBUTROL 1 MMOL/ML IV SOLN
7.5000 mL | Freq: Once | INTRAVENOUS | Status: AC | PRN
  Administered 2022-10-05: 7.5 mL via INTRAVENOUS

## 2022-10-05 MED ORDER — ONDANSETRON HCL 4 MG/2ML IJ SOLN: 4.0000 mg | Freq: Four times a day (QID) | INTRAMUSCULAR | Status: DC | PRN

## 2022-10-05 MED ORDER — ACETAMINOPHEN 325 MG PO TABS
650.0000 mg | ORAL_TABLET | Freq: Four times a day (QID) | ORAL | Status: DC | PRN
  Administered 2022-10-06: 650 mg via ORAL
  Filled 2022-10-05: qty 2

## 2022-10-05 MED ORDER — FENTANYL CITRATE PF 50 MCG/ML IJ SOSY: 12.5000 ug | PREFILLED_SYRINGE | INTRAMUSCULAR | Status: DC | PRN

## 2022-10-05 MED ORDER — ACETAMINOPHEN 650 MG RE SUPP: 650.0000 mg | Freq: Four times a day (QID) | RECTAL | Status: DC | PRN

## 2022-10-05 MED ORDER — NALOXONE HCL 0.4 MG/ML IJ SOLN: 0.4000 mg | INTRAMUSCULAR | Status: DC | PRN

## 2022-10-05 MED ORDER — MELATONIN 3 MG PO TABS
3.0000 mg | ORAL_TABLET | Freq: Every evening | ORAL | Status: DC | PRN
  Administered 2022-10-06 – 2022-10-13 (×8): 3 mg via ORAL
  Filled 2022-10-05 (×8): qty 1

## 2022-10-05 NOTE — H&P (Signed)
History and Physical      OTHAR CURTO VQQ:[id number] DOB: February 21, 1928 DOA: 10/05/2022  PCP: [name], MD *** Patient coming from: home ***  I have personally briefly reviewed patient's old medical records in [place]  Chief Complaint: ***  HPI: RYLEE NUZUM is a 89 y.o. male with medical history significant for *** who is admitted to [facility] on 10/05/2022 with *** after presenting from home*** to [facility] ED complaining of ***.    ***       ***   ED Course:  Vital signs in the ED were notable for the following: ***  Labs were notable for the following: ***  Per my interpretation, EKG in ED demonstrated the following:  ***  Imaging and additional notable ED work-up: ***  While in the ED, the following were administered: ***  Subsequently, the patient was admitted  ***  ***red    Review of Systems: As per HPI otherwise 10 point review of systems negative.   Past Medical History:  Diagnosis Date   Allergy    ANEMIA ASSOCIATED W/OTHER [facility] NUTRITIONAL DEFIC 08/11/2010   Qualifier: Diagnosis of  By: Ronnald Ramp MD, [name].    Cancer [facility])    skin   Cataract    Chronic combined systolic and diastolic congestive heart failure ([place]) 10/30/2018   COPD with asthma 03/02/2017   CRI (chronic renal insufficiency), stage 3 (moderate) (HCC) 10/25/2018   Deficiency anemia 10/25/2018   Diabetes mellitus    type 2   DOE (dyspnea on exertion) 09/03/2018   Edema 06/11/2009   Qualifier: Diagnosis of  By: Ronnald Ramp MD, [name].    History of skin cancer    Hyperlipidemia with target LDL less than 100 11/10/2008        Hypertension    Memory loss 05/14/2009   Obesity (BMI 30.0-34.9) 04/01/2013   Osteoarthritis    PSA elevation 10/25/2018   SKIN CANCER, HX OF 11/10/2008   Qualifier: Diagnosis of  By: Ronnald Ramp MD, [name].    Snoring 04/03/2014   Type II diabetes mellitus with manifestations ([place]) 11/10/2008   Estimated Creatinine Clearance: 38 mL/min (A) (by C-G formula  based on SCr of 1.52 mg/dL (H)).    Venous stasis dermatitis of both lower extremities 01/29/2018   Vitamin D deficiency 11/10/2008    Past Surgical History:  Procedure Laterality Date   CHOLECYSTECTOMY     INGUINAL HERNIA REPAIR     x 3   JOINT REPLACEMENT     KNEE SURGERY     x 2   MELANOMA EXCISION     x 3 -- Left arm   TOTAL KNEE ARTHROPLASTY     x 3    Social History:  reports that he has never smoked. He has never used smokeless tobacco. He reports that he does not drink alcohol and does not use drugs.   Allergies  Allergen Reactions   Ace Inhibitors     REACTION: cough   Oxycodone-Acetaminophen    Penicillins     Family History  Problem Relation Age of Onset   Heart disease Mother    Diabetes Mother    Heart disease Father    Cancer Sister    Cancer Brother    Diabetes Son    Diabetes Other     Family history reviewed and not pertinent ***   Prior to Admission medications   Medication Sig Start Date End Date Taking? Authorizing Provider  atorvastatin (LIPITOR) 10  MG tablet TAKE 1 TABLET DAILY 09/27/22   [name], MD  B-D ULTRAFINE III [place] PEN 31G X 8 MM MISC USE TO INJECT INSULIN DAILY 02/01/22   [name], MD  Blood Glucose Monitoring Suppl (FREESTYLE FREEDOM LITE) W/DEVICE KIT Use to check blood sugars daily Dx E11.8 08/21/14   [name], MD  cholecalciferol (VITAMIN D) 1000 units tablet Take 1,000 Units by mouth daily.    [provider]  Colesevelam HCl 3.75 g PACK MIX AND DRINK 1 PACKET DAILY 06/27/22   [name], MD  Continuous Blood Gluc Receiver (FREESTYLE LIBRE 2 READER) DEVI 1 Act by Does not apply route daily. 04/22/21   [name], MD  Continuous Blood Gluc Sensor (FREESTYLE LIBRE 2 SENSOR) MISC 1 Act by Does not apply route daily. 04/22/21   [name], MD  Dietary Management Product (VASCULERA) TABS Take 1 tablet by mouth daily. 04/06/22   [name], MD  empagliflozin (JARDIANCE) 10 MG TABS  tablet Take 1 tablet (10 mg total) by mouth daily before breakfast. 08/02/22   [name], MD  FREESTYLE LITE test strip USE AS INSTRUCTED TO CHECK BLOOD SUGAR TWICE A DAY 03/04/21   [name], MD  Glucagon (GVOKE HYPOPEN 2-PACK) 1 MG/0.2ML SOAJ Inject 1 Act into the skin daily as needed. 12/21/20   [name], MD  HYDROcodone-acetaminophen (NORCO/VICODIN) 5-325 MG tablet Take 1 tablet by mouth every 6 (six) hours as needed for moderate pain. 09/21/22   [name], MD  Lancets (FREESTYLE) lancets Use to check blood sugars twice day Dx E11.8 08/21/14   [name], MD  LANTUS SOLOSTAR 100 UNIT/ML Solostar Pen INJECT 0.3ML (30 UNITS TOTAL) INTO THE SKIN AT BEDTIME 12/20/21   [name], MD  losartan (COZAAR) 25 MG tablet TAKE 1 TABLET DAILY 09/27/22   [name], MD  metFORMIN (GLUCOPHAGE) 500 MG tablet TAKE 1 TABLET TWICE A DAY WITH MEALS 09/21/22   [name], MD  methocarbamol (ROBAXIN) 500 MG tablet  08/26/22   [provider]  metoprolol tartrate (LOPRESSOR) 25 MG tablet TAKE ONE-HALF (1/2) TABLET TWICE A DAY 09/27/22   [name], MD  Multiple Vitamins-Minerals (PRESERVISION/LUTEIN) CAPS Take 1 capsule by mouth 2 (two) times daily.    [provider]  nabumetone (RELAFEN) 750 MG tablet  08/26/22   [provider]  torsemide (DEMADEX) 10 MG tablet Take 1 tablet (10 mg total) by mouth daily. Please call our office to schedule a yearly follow up for future refills. [phone]. Thank you (First attempt) 07/25/22   [name], MD  Vibegron [facility]) 75 MG TABS TAKE 1 TABLET DAILY 07/25/22   [name], MD  vitamin B-12 (CYANOCOBALAMIN) 500 MCG tablet Take 500 mcg by mouth daily.    [provider]     Objective    Physical Exam: Vitals:   10/05/22 2013  BP: (!) 149/77  Pulse: 93  Resp: 20  Temp: 98.4 F (36.9 C)  TempSrc: Oral  SpO2: 98%    General: appears to be stated age; alert,  oriented Skin: warm, dry, no rash Head:  AT/[place] Mouth:  Oral mucosa membranes appear moist, normal dentition Neck: supple; trachea midline Heart:  RRR; did not appreciate any M/R/G Lungs: CTAB, did not appreciate any wheezes, rales, or rhonchi Abdomen: + BS; soft, ND, NT Vascular: 2+ pedal pulses b/l; 2+ radial pulses b/l Extremities: no peripheral edema, no  muscle wasting Neuro: strength and sensation intact in upper and lower extremities b/l ***   *** Neuro: 5/5 strength of the proximal and distal flexors and extensors of the upper and lower extremities bilaterally; sensation intact in upper and lower extremities b/l; cranial nerves II through XII grossly intact; no pronator drift; no evidence suggestive of slurred speech, dysarthria, or facial droop; Normal muscle tone. No tremors.  *** Neuro: In the setting of the patient's current mental status and associated inability to follow instructions, unable to perform full neurologic exam at this time.  As such, assessment of strength, sensation, and cranial nerves is limited at this time. Patient noted to spontaneously move all 4 extremities. No tremors.  ***    Labs on Admission: I have personally reviewed following labs and imaging studies  CBC: Recent Labs  Lab 10/05/22 2022 10/05/22 2032  WBC 13.8*  --   NEUTROABS 8.4*  --   HGB 12.9* 12.9*  HCT 39.1 38.0*  MCV 88.3  --   PLT 201  --    Basic Metabolic Panel: Recent Labs  Lab 10/05/22 2022 10/05/22 2032  NA 136 138  K 3.3* 3.3*  CL 104  --   CO2 22  --   GLUCOSE 215*  --   BUN 15  --   CREATININE 1.35*  --   CALCIUM 8.8*  --    GFR: CrCl cannot be calculated (Unknown ideal weight.). Liver Function Tests: Recent Labs  Lab 10/05/22 2022  AST 24  ALT 21  ALKPHOS 135*  BILITOT 0.8  PROT 5.9*  ALBUMIN 3.3*   Recent Labs  Lab 10/05/22 2022  LIPASE 30   Recent Labs  Lab 10/05/22 2022  AMMONIA <10   Coagulation Profile: No results for input(s):  "INR", "PROTIME" in the last 168 hours. Cardiac Enzymes: No results for input(s): "CKTOTAL", "CKMB", "CKMBINDEX", "TROPONINI" in the last 168 hours. BNP (last 3 results) Recent Labs    07/26/22 1406  PROBNP 59.0   HbA1C: No results for input(s): "HGBA1C" in the last 72 hours. CBG: No results for input(s): "GLUCAP" in the last 168 hours. Lipid Profile: No results for input(s): "CHOL", "HDL", "LDLCALC", "TRIG", "CHOLHDL", "LDLDIRECT" in the last 72 hours. Thyroid Function Tests: No results for input(s): "TSH", "T4TOTAL", "FREET4", "T3FREE", "THYROIDAB" in the last 72 hours. Anemia Panel: No results for input(s): "VITAMINB12", "FOLATE", "FERRITIN", "TIBC", "IRON", "RETICCTPCT" in the last 72 hours. Urine analysis:    Component Value Date/Time   COLORURINE YELLOW 10/05/2022 2125   APPEARANCEUR HAZY (A) 10/05/2022 2125   LABSPEC 1.010 10/05/2022 2125   PHURINE 5.0 10/05/2022 2125   GLUCOSEU 50 (A) 10/05/2022 2125   GLUCOSEU NEGATIVE 07/26/2022 1406   HGBUR NEGATIVE 10/05/2022 2125   BILIRUBINUR NEGATIVE 10/05/2022 2125   KETONESUR NEGATIVE 10/05/2022 2125   PROTEINUR NEGATIVE 10/05/2022 2125   UROBILINOGEN 0.2 07/26/2022 1406   NITRITE NEGATIVE 10/05/2022 2125   LEUKOCYTESUR NEGATIVE 10/05/2022 2125    Radiological Exams on Admission: CT Lumbar Spine Wo Contrast  Result Date: 10/05/2022 CLINICAL DATA:  Low back pain.  Increased fracture risk. EXAM: CT LUMBAR SPINE WITHOUT CONTRAST TECHNIQUE: Multidetector CT imaging of the lumbar spine was performed without intravenous contrast administration. Multiplanar CT image reconstructions were also generated. RADIATION DOSE REDUCTION: This exam was performed according to the departmental dose-optimization program which includes automated exposure control, adjustment of the mA and/or kV according to patient size and/or use of iterative reconstruction technique. COMPARISON:  Radiography 07/26/2022 FINDINGS: Segmentation: 5 lumbar type  vertebral bodies. Alignment: Chronic mild S shaped scoliotic curvature. Vertebrae: Chronic degenerative endplate changes at the L1-2 level. Newly seen lytic destructive pattern throughout the L3 vertebral body, worse towards the left side. Question some involvement of the L2 vertebral body and superior aspect of the L4 vertebral body. Lucent focus within the central portion of the S1 vertebral body. These findings could be due to metastatic disease, infection or both. See below. Paraspinal and other soft tissues: Distended bladder. Aortic atherosclerosis. Pronounced edematous change in the soft tissues surrounding the L3 vertebral body, worrisome in particular for infection but possibly consistent with tumor as well. Disc levels: T9-10 through T12-L1: Negative L1-2: Chronic disc degeneration with loss of disc height. Endplate osteophytes and mild bulging of the disc. No compressive stenosis. L2-3: Disc space narrowing and vacuum phenomenon. Circumferential bulging of the disc. Facet and ligamentous hypertrophy. Multifactorial stenosis at this level that could cause neural compression. As noted above, there may be some lytic changes of the inferior L2 vertebral body. This raises the possibility of discitis osteomyelitis involving this level. L3-4: Lytic destructive appearance of the L3 vertebral body with loss of height more on the left. Edematous change of the paravertebral soft tissues. Bulging of the disc. Facet and ligamentous hypertrophy. Severe stenosis at this level. Epidural abscess not excluded. As noted above, there could be lesser destructive change of superior L4. Certainly the findings at this level or worrisome for discitis osteomyelitis. L4-5: Chronic disc degeneration with loss of disc height. Bulging of the disc. Facet and ligamentous hypertrophy. Moderate degenerative type stenosis. L5-S1: Chronic disc degeneration with loss of disc height. Endplate osteophytes and bulging of the disc. Facet  degeneration. No canal stenosis. Mild bilateral foraminal stenosis. Lucent area within the S1 vertebral body which is indeterminate. IMPRESSION: 1. Lytic destructive pattern throughout the L3 vertebral body with loss of height more on the left. Question some involvement of the L2 vertebral body and superior aspect of the L4 vertebral body. Pronounced edematous change in the paravertebral soft tissues surrounding the L3 vertebral body, worrisome for infection but possibly consistent with tumor as well. 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 Severe stenosis at the L2-3 and L3-4 levels, possibly resulting in neurogenic bladder. Epidural abscess is not excluded. Is the patient an MR candidate? 2. Lucent area within the S1 vertebral body which is indeterminate. 3. Chronic degenerative changes at L1-2, L4-5 and L5-S1 as above. Aortic Atherosclerosis (ICD10-I70.0). Electronically Signed   By: Nelson Chimes M.D.   On: 10/05/2022 21:20   DG Chest 1 View  Result Date: 10/05/2022 CLINICAL DATA:  Altered mental status EXAM: CHEST  1 VIEW COMPARISON:  06/02/2021 FINDINGS: Heart is normal size. Mediastinal contours within normal limits. Bibasilar atelectasis. No effusions or pneumothorax. No acute bony abnormality. IMPRESSION: Bibasilar atelectasis. Electronically Signed   By: Rolm Baptise M.D.   On: 10/05/2022 21:13   DG Pelvis 1-2 Views  Result Date: 10/05/2022 CLINICAL DATA:  Left hip and pelvic pain EXAM: PELVIS - 1-2 VIEW COMPARISON:  None Available. FINDINGS: Mild symmetric degenerative changes in the hips with joint space narrowing and spurring. No acute bony abnormality. Specifically, no fracture, subluxation, or dislocation. Degenerative changes in the visualized lower lumbar spine. IMPRESSION: No acute bony abnormality. Electronically Signed   By: Rolm Baptise M.D.   On: 10/05/2022 21:13   CT Head Wo Contrast  Result Date: 10/05/2022 CLINICAL DATA:  Delirium.  Low back pain. EXAM:  CT HEAD WITHOUT CONTRAST TECHNIQUE: Contiguous  axial images were obtained from the base of the skull through the vertex without intravenous contrast. RADIATION DOSE REDUCTION: This exam was performed according to the departmental dose-optimization program which includes automated exposure control, adjustment of the mA and/or kV according to patient size and/or use of iterative reconstruction technique. COMPARISON:  MRI 02/28/2004 FINDINGS: Brain: Generalized brain volume loss. Moderate chronic small-vessel ischemic changes of the white matter. No sign of acute infarction, mass lesion, hemorrhage, hydrocephalus or extra-axial collection. Vascular: There is atherosclerotic calcification of the major vessels at the base of the brain. Skull: Negative Sinuses/Orbits: Opacified right maxillary sinus.  Orbits negative. Other: None IMPRESSION: 1. No acute CT finding. Atrophy and chronic small-vessel ischemic changes of the white matter. 2. Opacified right maxillary sinus. Electronically Signed   By: Nelson Chimes M.D.   On: 10/05/2022 21:11      Assessment/Plan   Principal Problem:   Acute encephalopathy   ***       ***            #) ***   CT of the lumbar spine showed evidence of lytic destruction of the L3-L5 vertebral bodies, potentially representing osteomyelitis versus pathological fracture.  EDP discussed with on-call neurosurgery, Glenford Peers, NP (working with Dr. Saintclair Halsted) who will formally consult and recommended further evaluation with MRI of the lumbar spine, without associated recommendations for empiric initiation of IV antibiotics or systemic corticosteroids at this time.             ***            ***            ***            ***             ***           ***           ***   ***  DVT prophylaxis: SCD's ***  Code Status: Full code*** Family Communication: none*** Disposition Plan: Per  Rounding Team Consults called: none***;  Admission status: ***     I SPENT GREATER THAN 75 *** MINUTES IN CLINICAL CARE TIME/MEDICAL DECISION-MAKING IN COMPLETING THIS ADMISSION.      [place] DO [facility]  From [place]   10/05/2022, 11:24 PM   ***

## 2022-10-05 NOTE — ED Notes (Signed)
PT in MRI.

## 2022-10-05 NOTE — ED Triage Notes (Signed)
Pt bib [facility] from home where family noticed the patient was not oriented to place. Pt is baseline oriented x 4. Pt had two cataract shots today which is causing him to be unable to open his eyes. [id number] vitals: 148/90, 90-106 HR, 99% RA, 198 CBG

## 2022-10-05 NOTE — ED Provider Notes (Signed)
[facility] Provider Note  CSN: [id number] Arrival date & time: 10/05/22 2005  Chief Complaint(s) Altered Mental Status  HPI Nicholas Burton is a 89 y.o. male with past medical history as below, significant for CHF, COPD, renal insufficiency, HLD, hypertension, dementia, type II DM who presents to the ED with complaint of AMS.  Per family patient was disoriented to place, baseline he is oriented to place.  He had cataract procedure today which is making difficult for him to open his eyes.  On my assessment patient has pain to his left lower extremity, difficulty raising his left lower extremity secondary to weakness, pain to his left low back.  Reports is been ongoing for around 2 weeks.  Gradually worsening.  Difficult for him to ambulate.  No chest pain or dyspnea, no abdominal pain, no nausea or vomiting.  He has some pain to his bilateral eyes following procedure earlier today.   Past Medical History Past Medical History:  Diagnosis Date   Allergy    ANEMIA ASSOCIATED W/OTHER [facility] NUTRITIONAL DEFIC 08/11/2010   Qualifier: Diagnosis of  By: Ronnald Ramp MD, [name].    Cancer [facility])    skin   Cataract    Chronic combined systolic and diastolic congestive heart failure ([place]) 10/30/2018   COPD with asthma 03/02/2017   CRI (chronic renal insufficiency), stage 3 (moderate) (HCC) 10/25/2018   Deficiency anemia 10/25/2018   Diabetes mellitus    type 2   DOE (dyspnea on exertion) 09/03/2018   Edema 06/11/2009   Qualifier: Diagnosis of  By: Ronnald Ramp MD, [name].    History of skin cancer    Hyperlipidemia with target LDL less than 100 11/10/2008        Hypertension    Memory loss 05/14/2009   Obesity (BMI 30.0-34.9) 04/01/2013   Osteoarthritis    PSA elevation 10/25/2018   SKIN CANCER, HX OF 11/10/2008   Qualifier: Diagnosis of  By: Ronnald Ramp MD, [name].    Snoring 04/03/2014   Type II diabetes mellitus with manifestations ([place]) 11/10/2008   Estimated Creatinine  Clearance: 38 mL/min (A) (by C-G formula based on SCr of 1.52 mg/dL (H)).    Venous stasis dermatitis of both lower extremities 01/29/2018   Vitamin D deficiency 11/10/2008   Patient Active Problem List   Diagnosis Date Noted   Spinal stenosis of lumbar region with neurogenic claudication 09/21/2022   Encounter for palliative care involving management of pain 09/21/2022   Late onset Alzheimer's dementia without behavioral disturbance ([place]) 09/21/2020   Iron deficiency anemia secondary to inadequate dietary iron intake 06/06/2019   Chronic combined systolic and diastolic congestive heart failure ([place]) 10/30/2018   CRI (chronic renal insufficiency), stage 3 (moderate) 10/25/2018   Venous stasis dermatitis of both lower extremities 01/29/2018   COPD with asthma 03/02/2017   Obesity (BMI 30.0-34.9) 04/01/2013   BPH (benign prostatic hyperplasia) 09/08/2011   B12 deficiency 08/13/2010   Type II diabetes mellitus with manifestations ([place]) 11/10/2008   Vitamin D deficiency 11/10/2008   Hyperlipidemia with target LDL less than 100 11/10/2008   Essential hypertension 11/10/2008   Osteoarthritis 11/10/2008   Home Medication(s) Prior to Admission medications   Medication Sig Start Date End Date Taking? Authorizing Provider  atorvastatin (LIPITOR) 10 MG tablet TAKE 1 TABLET DAILY 09/27/22   [name], MD  B-D ULTRAFINE III [place] PEN 31G X 8 MM MISC USE TO INJECT INSULIN DAILY 02/01/22   [name], MD  Blood  Glucose Monitoring Suppl (FREESTYLE FREEDOM LITE) W/DEVICE KIT Use to check blood sugars daily Dx E11.8 08/21/14   [name], MD  cholecalciferol (VITAMIN D) 1000 units tablet Take 1,000 Units by mouth daily.    [provider]  Colesevelam HCl 3.75 g PACK MIX AND DRINK 1 PACKET DAILY 06/27/22   [name], MD  Continuous Blood Gluc Receiver (FREESTYLE LIBRE 2 READER) DEVI 1 Act by Does not apply route daily. 04/22/21   [name], MD  Continuous Blood Gluc  Sensor (FREESTYLE LIBRE 2 SENSOR) MISC 1 Act by Does not apply route daily. 04/22/21   [name], MD  Dietary Management Product (VASCULERA) TABS Take 1 tablet by mouth daily. 04/06/22   [name], MD  empagliflozin (JARDIANCE) 10 MG TABS tablet Take 1 tablet (10 mg total) by mouth daily before breakfast. 08/02/22   [name], MD  FREESTYLE LITE test strip USE AS INSTRUCTED TO CHECK BLOOD SUGAR TWICE A DAY 03/04/21   [name], MD  Glucagon (GVOKE HYPOPEN 2-PACK) 1 MG/0.2ML SOAJ Inject 1 Act into the skin daily as needed. 12/21/20   [name], MD  HYDROcodone-acetaminophen (NORCO/VICODIN) 5-325 MG tablet Take 1 tablet by mouth every 6 (six) hours as needed for moderate pain. 09/21/22   [name], MD  Lancets (FREESTYLE) lancets Use to check blood sugars twice day Dx E11.8 08/21/14   [name], MD  LANTUS SOLOSTAR 100 UNIT/ML Solostar Pen INJECT 0.3ML (30 UNITS TOTAL) INTO THE SKIN AT BEDTIME 12/20/21   [name], MD  losartan (COZAAR) 25 MG tablet TAKE 1 TABLET DAILY 09/27/22   [name], MD  metFORMIN (GLUCOPHAGE) 500 MG tablet TAKE 1 TABLET TWICE A DAY WITH MEALS 09/21/22   [name], MD  methocarbamol (ROBAXIN) 500 MG tablet  08/26/22   [provider]  metoprolol tartrate (LOPRESSOR) 25 MG tablet TAKE ONE-HALF (1/2) TABLET TWICE A DAY 09/27/22   [name], MD  Multiple Vitamins-Minerals (PRESERVISION/LUTEIN) CAPS Take 1 capsule by mouth 2 (two) times daily.    [provider]  nabumetone (RELAFEN) 750 MG tablet  08/26/22   [provider]  torsemide (DEMADEX) 10 MG tablet Take 1 tablet (10 mg total) by mouth daily. Please call our office to schedule a yearly follow up for future refills. [phone]. Thank you (First attempt) 07/25/22   [name], MD  Vibegron [facility]) 75 MG TABS TAKE 1 TABLET DAILY 07/25/22   [name], MD  vitamin B-12 (CYANOCOBALAMIN) 500 MCG tablet Take 500 mcg by  mouth daily.    [provider]                                                                                                                                    Past Surgical History Past Surgical History:  Procedure Laterality Date   CHOLECYSTECTOMY  INGUINAL HERNIA REPAIR     x 3   JOINT REPLACEMENT     KNEE SURGERY     x 2   MELANOMA EXCISION     x 3 -- Left arm   TOTAL KNEE ARTHROPLASTY     x 3   Family History Family History  Problem Relation Age of Onset   Heart disease Mother    Diabetes Mother    Heart disease Father    Cancer Sister    Cancer Brother    Diabetes Son    Diabetes Other     Social History Social History   Tobacco Use   Smoking status: Never   Smokeless tobacco: Never  Vaping Use   Vaping Use: Never used  Substance Use Topics   Alcohol use: No   Drug use: No   Allergies Ace inhibitors, Oxycodone-acetaminophen, and Penicillins  Review of Systems Review of Systems  Constitutional:  Negative for chills and fever.  HENT:  Negative for facial swelling and trouble swallowing.   Eyes:  Negative for photophobia and visual disturbance.  Respiratory:  Negative for cough and shortness of breath.   Cardiovascular:  Negative for chest pain and palpitations.  Gastrointestinal:  Negative for abdominal pain, nausea and vomiting.  Endocrine: Negative for polydipsia and polyuria.  Genitourinary:  Negative for difficulty urinating and hematuria.  Musculoskeletal:  Positive for arthralgias and back pain. Negative for gait problem and joint swelling.  Skin:  Negative for pallor and rash.  Neurological:  Positive for weakness. Negative for syncope, numbness and headaches.  Psychiatric/Behavioral:  Negative for agitation and confusion.     Physical Exam Vital Signs  I have reviewed the triage vital signs BP (!) 149/77 (BP Location: Left Arm)   Pulse 93   Temp 98.4 F (36.9 C) (Oral)   Resp 20   SpO2 98%  Physical Exam Vitals and  nursing note reviewed.  Constitutional:      General: He is not in acute distress.    Appearance: Normal appearance. He is well-developed. He is not ill-appearing or diaphoretic.     Comments: Eyes firmly closed  HENT:     Head: Normocephalic and atraumatic.     Right Ear: External ear normal.     Left Ear: External ear normal.     Mouth/Throat:     Mouth: Mucous membranes are moist.  Eyes:     General: No scleral icterus.       Right eye: No discharge.        Left eye: No discharge.     Extraocular Movements: Extraocular movements intact.     Pupils: Pupils are equal, round, and reactive to light.     Comments: Conjunctival injection b/l  Cardiovascular:     Rate and Rhythm: Normal rate and regular rhythm.     Pulses: Normal pulses.     Heart sounds: Normal heart sounds.  Pulmonary:     Effort: Pulmonary effort is normal. No respiratory distress.     Breath sounds: Normal breath sounds.  Abdominal:     General: Abdomen is flat.     Palpations: Abdomen is soft.     Tenderness: There is no abdominal tenderness.  Musculoskeletal:        General: Normal range of motion.       Arms:     Cervical back: No rigidity.     Right lower leg: No edema.     Left lower leg: No edema.     Comments:  Equal pulses radial/ pt b/l   Skin:    General: Skin is warm and dry.     Capillary Refill: Capillary refill takes less than 2 seconds.  Neurological:     Mental Status: He is alert and oriented to person, place, and time.     GCS: GCS eye subscore is 4. GCS verbal subscore is 5. GCS motor subscore is 6.     Cranial Nerves: Cranial nerves 2-12 are intact.     Sensory: Sensation is intact.     Motor: Weakness present.     Coordination: Coordination is intact.     Comments: LLE 3/5 strength, RLE 5/5 strength Reduced ROM / strength to LLE 2/2 pain x2 weeks Gait not tested 2/2 pt safety   Psychiatric:        Mood and Affect: Mood normal.        Behavior: Behavior normal.     ED  Results and Treatments Labs (all labs ordered are listed, but only abnormal results are displayed) Labs Reviewed  CBC WITH DIFFERENTIAL/PLATELET - Abnormal; Notable for the following components:      Result Value   WBC 13.8 (*)    Hemoglobin 12.9 (*)    Neutro Abs 8.4 (*)    Monocytes Absolute 1.9 (*)    All other components within normal limits  COMPREHENSIVE METABOLIC PANEL - Abnormal; Notable for the following components:   Potassium 3.3 (*)    Glucose, Bld 215 (*)    Creatinine, Ser 1.35 (*)    Calcium 8.8 (*)    Total Protein 5.9 (*)    Albumin 3.3 (*)    Alkaline Phosphatase 135 (*)    GFR, Estimated 49 (*)    All other components within normal limits  URINALYSIS, ROUTINE W REFLEX MICROSCOPIC - Abnormal; Notable for the following components:   APPearance HAZY (*)    Glucose, UA 50 (*)    Bacteria, UA RARE (*)    All other components within normal limits  ETHANOL - Abnormal; Notable for the following components:   Alcohol, Ethyl (B) 11 (*)    All other components within normal limits  I-STAT VENOUS BLOOD GAS, ED - Abnormal; Notable for the following components:   pCO2, Ven 39.6 (*)    pO2, Ven 27 (*)    Potassium 3.3 (*)    HCT 38.0 (*)    Hemoglobin 12.9 (*)    All other components within normal limits  RESP PANEL BY RT-PCR (RSV, FLU A&B, COVID)  RVPGX2  LIPASE, BLOOD  AMMONIA  BLOOD GAS, VENOUS  RAPID URINE DRUG SCREEN, HOSP PERFORMED                                                                                                                          Radiology CT Lumbar Spine Wo Contrast  Result Date: 10/05/2022 CLINICAL DATA:  Low back pain.  Increased fracture risk. EXAM: CT LUMBAR SPINE WITHOUT CONTRAST TECHNIQUE: Multidetector CT imaging of the  lumbar spine was performed without intravenous contrast administration. Multiplanar CT image reconstructions were also generated. RADIATION DOSE REDUCTION: This exam was performed according to the departmental  dose-optimization program which includes automated exposure control, adjustment of the mA and/or kV according to patient size and/or use of iterative reconstruction technique. COMPARISON:  Radiography 07/26/2022 FINDINGS: Segmentation: 5 lumbar type vertebral bodies. Alignment: Chronic mild S shaped scoliotic curvature. Vertebrae: Chronic degenerative endplate changes at the L1-2 level. Newly seen lytic destructive pattern throughout the L3 vertebral body, worse towards the left side. Question some involvement of the L2 vertebral body and superior aspect of the L4 vertebral body. Lucent focus within the central portion of the S1 vertebral body. These findings could be due to metastatic disease, infection or both. See below. Paraspinal and other soft tissues: Distended bladder. Aortic atherosclerosis. Pronounced edematous change in the soft tissues surrounding the L3 vertebral body, worrisome in particular for infection but possibly consistent with tumor as well. Disc levels: T9-10 through T12-L1: Negative L1-2: Chronic disc degeneration with loss of disc height. Endplate osteophytes and mild bulging of the disc. No compressive stenosis. L2-3: Disc space narrowing and vacuum phenomenon. Circumferential bulging of the disc. Facet and ligamentous hypertrophy. Multifactorial stenosis at this level that could cause neural compression. As noted above, there may be some lytic changes of the inferior L2 vertebral body. This raises the possibility of discitis osteomyelitis involving this level. L3-4: Lytic destructive appearance of the L3 vertebral body with loss of height more on the left. Edematous change of the paravertebral soft tissues. Bulging of the disc. Facet and ligamentous hypertrophy. Severe stenosis at this level. Epidural abscess not excluded. As noted above, there could be lesser destructive change of superior L4. Certainly the findings at this level or worrisome for discitis osteomyelitis. L4-5: Chronic disc  degeneration with loss of disc height. Bulging of the disc. Facet and ligamentous hypertrophy. Moderate degenerative type stenosis. L5-S1: Chronic disc degeneration with loss of disc height. Endplate osteophytes and bulging of the disc. Facet degeneration. No canal stenosis. Mild bilateral foraminal stenosis. Lucent area within the S1 vertebral body which is indeterminate. IMPRESSION: 1. Lytic destructive pattern throughout the L3 vertebral body with loss of height more on the left. Question some involvement of the L2 vertebral body and superior aspect of the L4 vertebral body. Pronounced edematous change in the paravertebral soft tissues surrounding the L3 vertebral body, worrisome for infection but possibly consistent with tumor as well. 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 Severe stenosis at the L2-3 and L3-4 levels, possibly resulting in neurogenic bladder. Epidural abscess is not excluded. Is the patient an MR candidate? 2. Lucent area within the S1 vertebral body which is indeterminate. 3. Chronic degenerative changes at L1-2, L4-5 and L5-S1 as above. Aortic Atherosclerosis (ICD10-I70.0). Electronically Signed   By: Nelson Chimes M.D.   On: 10/05/2022 21:20   DG Chest 1 View  Result Date: 10/05/2022 CLINICAL DATA:  Altered mental status EXAM: CHEST  1 VIEW COMPARISON:  06/02/2021 FINDINGS: Heart is normal size. Mediastinal contours within normal limits. Bibasilar atelectasis. No effusions or pneumothorax. No acute bony abnormality. IMPRESSION: Bibasilar atelectasis. Electronically Signed   By: Rolm Baptise M.D.   On: 10/05/2022 21:13   DG Pelvis 1-2 Views  Result Date: 10/05/2022 CLINICAL DATA:  Left hip and pelvic pain EXAM: PELVIS - 1-2 VIEW COMPARISON:  None Available. FINDINGS: Mild symmetric degenerative changes in the hips with joint space narrowing and spurring. No acute bony abnormality.  Specifically, no fracture, subluxation, or dislocation. Degenerative changes  in the visualized lower lumbar spine. IMPRESSION: No acute bony abnormality. Electronically Signed   By: Rolm Baptise M.D.   On: 10/05/2022 21:13   CT Head Wo Contrast  Result Date: 10/05/2022 CLINICAL DATA:  Delirium.  Low back pain. EXAM: CT HEAD WITHOUT CONTRAST TECHNIQUE: Contiguous axial images were obtained from the base of the skull through the vertex without intravenous contrast. RADIATION DOSE REDUCTION: This exam was performed according to the departmental dose-optimization program which includes automated exposure control, adjustment of the mA and/or kV according to patient size and/or use of iterative reconstruction technique. COMPARISON:  MRI 02/28/2004 FINDINGS: Brain: Generalized brain volume loss. Moderate chronic small-vessel ischemic changes of the white matter. No sign of acute infarction, mass lesion, hemorrhage, hydrocephalus or extra-axial collection. Vascular: There is atherosclerotic calcification of the major vessels at the base of the brain. Skull: Negative Sinuses/Orbits: Opacified right maxillary sinus.  Orbits negative. Other: None IMPRESSION: 1. No acute CT finding. Atrophy and chronic small-vessel ischemic changes of the white matter. 2. Opacified right maxillary sinus. Electronically Signed   By: Nelson Chimes M.D.   On: 10/05/2022 21:11    Pertinent labs & imaging results that were available during my care of the patient were reviewed by me and considered in my medical decision making (see MDM for details).  Medications Ordered in ED Medications - No data to display                                                                                                                                   Procedures Procedures  (including critical care time)  Medical Decision Making / ED Course    Medical Decision Making:    JAKYE MULLENS is a 89 y.o. male with past medical history as below, significant for CHF, COPD, renal insufficiency, HLD, hypertension, dementia, type II  DM who presents to the ED with complaint of AMS.. The complaint involves an extensive differential diagnosis and also carries with it a high risk of complications and morbidity.  Serious etiology was considered. Ddx includes but is not limited to: Differential diagnoses for altered mental status includes but is not exclusive to alcohol, illicit or prescription medications, intracranial pathology such as stroke, intracerebral hemorrhage, fever or infectious causes including sepsis, hypoxemia, uremia, trauma, endocrine related disorders such as diabetes, hypoglycemia, thyroid-related diseases, Differential diagnosis includes but is not exclusive to musculoskeletal back pain, renal colic, urinary tract infection, pyelonephritis, intra-abdominal causes of back pain, aortic aneurysm or dissection, cauda equina syndrome, sciatica, lumbar disc disease, thoracic disc disease, etc. etc.   Complete initial physical exam performed, notably the patient  was resting comfortably, LE weakness / pain. Neuro exam o/w non-focal.    Reviewed and confirmed nursing documentation for past medical history, family history, social history.  Vital signs reviewed.    Clinical Course as of 10/05/22 2303  Wed Oct 05, 2022  2148 "IMPRESSION: 1. Lytic destructive pattern throughout the L3 vertebral body with loss of height more on the left. Question some involvement of the L2 vertebral body and superior aspect of the L4 vertebral body. Pronounced edematous change in the paravertebral soft tissues surrounding the L3 vertebral body, worrisome for infection but possibly consistent with tumor as well. 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 Severe stenosis at the L2-3 and L3-4 levels, possibly resulting in neurogenic bladder. Epidural abscess is not excluded." [SG]  2206 Abnormal CT L-spine, patient with weakness low back pain to his left lower extremity over the past 2 weeks, multiple falls.  No  saddle paresthesia.  Will get MRI [SG]    Clinical Course User Index [SG] Jeanell Sparrow, DO    Discussed with family at bedside, reports has been having difficulty walking over the past 2 weeks, frequent falls, left leg weakness, intermittent numbness to his left leg.  Low back pain.  Seen urgent care, thought to have spinal stenosis, started on steroids and narcotics without significant improvement to his low back and leg weakness.  CT imaging concerning as above.  Will discuss with neurosurgery, get MRI.    I discussed with neurosurgery on-call, recommend medical admission, they will come see in consult.     Additional history obtained: -Additional history obtained from family -External records from outside source obtained and reviewed including: Chart review including previous notes, labs, imaging, consultation notes including prior ED visits, home medications, prior labs and imaging   Lab Tests: -I ordered, reviewed, and interpreted labs.   The pertinent results include:   Labs Reviewed  CBC WITH DIFFERENTIAL/PLATELET - Abnormal; Notable for the following components:      Result Value   WBC 13.8 (*)    Hemoglobin 12.9 (*)    Neutro Abs 8.4 (*)    Monocytes Absolute 1.9 (*)    All other components within normal limits  COMPREHENSIVE METABOLIC PANEL - Abnormal; Notable for the following components:   Potassium 3.3 (*)    Glucose, Bld 215 (*)    Creatinine, Ser 1.35 (*)    Calcium 8.8 (*)    Total Protein 5.9 (*)    Albumin 3.3 (*)    Alkaline Phosphatase 135 (*)    GFR, Estimated 49 (*)    All other components within normal limits  URINALYSIS, ROUTINE W REFLEX MICROSCOPIC - Abnormal; Notable for the following components:   APPearance HAZY (*)    Glucose, UA 50 (*)    Bacteria, UA RARE (*)    All other components within normal limits  ETHANOL - Abnormal; Notable for the following components:   Alcohol, Ethyl (B) 11 (*)    All other components within normal limits   I-STAT VENOUS BLOOD GAS, ED - Abnormal; Notable for the following components:   pCO2, Ven 39.6 (*)    pO2, Ven 27 (*)    Potassium 3.3 (*)    HCT 38.0 (*)    Hemoglobin 12.9 (*)    All other components within normal limits  RESP PANEL BY RT-PCR (RSV, FLU A&B, COVID)  RVPGX2  LIPASE, BLOOD  AMMONIA  BLOOD GAS, VENOUS  RAPID URINE DRUG SCREEN, HOSP PERFORMED    Notable for leukocytosis, creatinine is similar to his baseline.  ETOH is mildly elevated  EKG   EKG Interpretation  Date/Time:    Ventricular Rate:    PR Interval:    QRS Duration:   QT Interval:  QTC Calculation:   R Axis:     Text Interpretation:           Imaging Studies ordered: I ordered imaging studies including CT lumbar, CT head, chest and pelvis x-ray I independently visualized the following imaging with scope of interpretation limited to determining acute life threatening conditions related to emergency care: Diffuse lumbar spine findings as noted above.  MRI is pending I independently visualized and interpreted imaging. I agree with the radiologist interpretation   Medicines ordered and prescription drug management: No orders of the defined types were placed in this encounter.   -I have reviewed the patients home medicines and have made adjustments as needed   Consultations Obtained: I requested consultation with the [facility],  and discussed lab and imaging findings as well as pertinent plan - they recommend: admission   Cardiac Monitoring: The patient was maintained on a cardiac monitor.  I personally viewed and interpreted the cardiac monitored which showed an underlying rhythm of: nsr  Social Determinants of Health:  Diagnosis or treatment significantly limited by social determinants of health: dementia   Reevaluation: After the interventions noted above, I reevaluated the patient and found that they have stayed the same  Co morbidities that complicate the patient  evaluation  Past Medical History:  Diagnosis Date   Allergy    ANEMIA ASSOCIATED W/OTHER [facility] NUTRITIONAL DEFIC 08/11/2010   Qualifier: Diagnosis of  By: Ronnald Ramp MD, [name].    Cancer [facility])    skin   Cataract    Chronic combined systolic and diastolic congestive heart failure ([place]) 10/30/2018   COPD with asthma 03/02/2017   CRI (chronic renal insufficiency), stage 3 (moderate) (HCC) 10/25/2018   Deficiency anemia 10/25/2018   Diabetes mellitus    type 2   DOE (dyspnea on exertion) 09/03/2018   Edema 06/11/2009   Qualifier: Diagnosis of  By: Ronnald Ramp MD, [name].    History of skin cancer    Hyperlipidemia with target LDL less than 100 11/10/2008        Hypertension    Memory loss 05/14/2009   Obesity (BMI 30.0-34.9) 04/01/2013   Osteoarthritis    PSA elevation 10/25/2018   SKIN CANCER, HX OF 11/10/2008   Qualifier: Diagnosis of  By: Ronnald Ramp MD, [name].    Snoring 04/03/2014   Type II diabetes mellitus with manifestations ([place]) 11/10/2008   Estimated Creatinine Clearance: 38 mL/min (A) (by C-G formula based on SCr of 1.52 mg/dL (H)).    Venous stasis dermatitis of both lower extremities 01/29/2018   Vitamin D deficiency 11/10/2008      Dispostion: Disposition decision including need for hospitalization was considered, and patient admitted to the hospital.    Final Clinical Impression(s) / ED Diagnoses Final diagnoses:  Acute midline low back pain without sciatica  Dementia, unspecified dementia severity, unspecified dementia type, unspecified whether behavioral, psychotic, or mood disturbance or anxiety ([place])     This chart was dictated using voice recognition software.  Despite best efforts to proofread,  errors can occur which can change the documentation meaning.

## 2022-10-05 NOTE — ED Notes (Signed)
Patient's family requested to speak with the admitting provider. RN sent provider a message through secure chat, they report they will come speak to the family and patient.

## 2022-10-06 ENCOUNTER — Encounter (HOSPITAL_COMMUNITY): Payer: Self-pay | Admitting: Internal Medicine

## 2022-10-06 DIAGNOSIS — I10 Essential (primary) hypertension: Secondary | ICD-10-CM | POA: Diagnosis not present

## 2022-10-06 DIAGNOSIS — E782 Mixed hyperlipidemia: Secondary | ICD-10-CM

## 2022-10-06 DIAGNOSIS — Z7189 Other specified counseling: Secondary | ICD-10-CM

## 2022-10-06 DIAGNOSIS — M899 Disorder of bone, unspecified: Secondary | ICD-10-CM | POA: Diagnosis present

## 2022-10-06 DIAGNOSIS — R6510 Systemic inflammatory response syndrome (SIRS) of non-infectious origin without acute organ dysfunction: Secondary | ICD-10-CM

## 2022-10-06 DIAGNOSIS — M4646 Discitis, unspecified, lumbar region: Secondary | ICD-10-CM

## 2022-10-06 DIAGNOSIS — E876 Hypokalemia: Secondary | ICD-10-CM | POA: Diagnosis present

## 2022-10-06 DIAGNOSIS — M4640 Discitis, unspecified, site unspecified: Secondary | ICD-10-CM | POA: Diagnosis present

## 2022-10-06 DIAGNOSIS — Z515 Encounter for palliative care: Secondary | ICD-10-CM | POA: Diagnosis not present

## 2022-10-06 DIAGNOSIS — G9340 Encephalopathy, unspecified: Secondary | ICD-10-CM | POA: Diagnosis not present

## 2022-10-06 LAB — CBC WITH DIFFERENTIAL/PLATELET
Abs Immature Granulocytes: 0.04 10*3/uL (ref 0.00–0.07)
Basophils Absolute: 0 10*3/uL (ref 0.0–0.1)
Basophils Relative: 0 %
Eosinophils Absolute: 0.1 10*3/uL (ref 0.0–0.5)
Eosinophils Relative: 1 %
HCT: 36.6 % — ABNORMAL LOW (ref 39.0–52.0)
Hemoglobin: 12.5 g/dL — ABNORMAL LOW (ref 13.0–17.0)
Immature Granulocytes: 0 %
Lymphocytes Relative: 22 %
Lymphs Abs: 2.7 10*3/uL (ref 0.7–4.0)
MCH: 29.4 pg (ref 26.0–34.0)
MCHC: 34.2 g/dL (ref 30.0–36.0)
MCV: 86.1 fL (ref 80.0–100.0)
Monocytes Absolute: 1.7 10*3/uL — ABNORMAL HIGH (ref 0.1–1.0)
Monocytes Relative: 14 %
Neutro Abs: 7.7 10*3/uL (ref 1.7–7.7)
Neutrophils Relative %: 63 %
Platelets: 182 10*3/uL (ref 150–400)
RBC: 4.25 MIL/uL (ref 4.22–5.81)
RDW: 14.9 % (ref 11.5–15.5)
WBC: 12.2 10*3/uL — ABNORMAL HIGH (ref 4.0–10.5)
nRBC: 0 % (ref 0.0–0.2)

## 2022-10-06 LAB — GLUCOSE, CAPILLARY
Glucose-Capillary: 257 mg/dL — ABNORMAL HIGH (ref 70–99)
Glucose-Capillary: 125 mg/dL — ABNORMAL HIGH (ref 70–99)
Glucose-Capillary: 147 mg/dL — ABNORMAL HIGH (ref 70–99)

## 2022-10-06 LAB — BLOOD GAS, VENOUS
Acid-Base Excess: 0.5 mmol/L (ref 0.0–2.0)
Bicarbonate: 24.6 mmol/L (ref 20.0–28.0)
Drawn by: 64037
O2 Saturation: 75.4 %
Patient temperature: 37.1
pCO2, Ven: 37 mmHg — ABNORMAL LOW (ref 44–60)
pH, Ven: 7.43 (ref 7.25–7.43)
pO2, Ven: 41 mmHg (ref 32–45)

## 2022-10-06 LAB — SEDIMENTATION RATE: Sed Rate: 5 mm/hr (ref 0–16)

## 2022-10-06 LAB — PROCALCITONIN: Procalcitonin: <0.1 ng/mL

## 2022-10-06 LAB — COMPREHENSIVE METABOLIC PANEL
ALT: 19 U/L (ref 0–44)
AST: 23 U/L (ref 15–41)
Albumin: 3 g/dL — ABNORMAL LOW (ref 3.5–5.0)
Alkaline Phosphatase: 130 U/L — ABNORMAL HIGH (ref 38–126)
Anion gap: 11 (ref 5–15)
BUN: 13 mg/dL (ref 8–23)
CO2: 22 mmol/L (ref 22–32)
Calcium: 8.7 mg/dL — ABNORMAL LOW (ref 8.9–10.3)
Chloride: 104 mmol/L (ref 98–111)
Creatinine, Ser: 1.22 mg/dL (ref 0.61–1.24)
GFR, Estimated: 56 mL/min — ABNORMAL LOW (ref >60)
Glucose, Bld: 187 mg/dL — ABNORMAL HIGH (ref 70–99)
Potassium: 3.3 mmol/L — ABNORMAL LOW (ref 3.5–5.1)
Sodium: 137 mmol/L (ref 135–145)
Total Bilirubin: 0.8 mg/dL (ref 0.3–1.2)
Total Protein: 5.4 g/dL — ABNORMAL LOW (ref 6.5–8.1)

## 2022-10-06 LAB — TYPE AND SCREEN
ABO/RH(D): O POS
Antibody Screen: NEGATIVE

## 2022-10-06 LAB — C-REACTIVE PROTEIN: CRP: 4.7 mg/dL — ABNORMAL HIGH (ref <1.0)

## 2022-10-06 LAB — MAGNESIUM: Magnesium: 2 mg/dL (ref 1.7–2.4)

## 2022-10-06 LAB — PROTIME-INR
INR: 1.1 (ref 0.8–1.2)
Prothrombin Time: 14 seconds (ref 11.4–15.2)

## 2022-10-06 LAB — TSH: TSH: 3.821 u[IU]/mL (ref 0.350–4.500)

## 2022-10-06 MED ORDER — TOBRAMYCIN 0.3 % OP SOLN
1.0000 [drp] | Freq: Four times a day (QID) | OPHTHALMIC | Status: AC
  Administered 2022-10-06 – 2022-10-07 (×4): 1 [drp] via OPHTHALMIC
  Filled 2022-10-06: qty 5

## 2022-10-06 MED ORDER — INSULIN GLARGINE-YFGN 100 UNIT/ML ~~LOC~~ SOLN
10.0000 [IU] | Freq: Every day | SUBCUTANEOUS | Status: DC
  Administered 2022-10-06 – 2022-10-08 (×3): 10 [IU] via SUBCUTANEOUS
  Filled 2022-10-06 (×4): qty 0.1

## 2022-10-06 MED ORDER — TOBRAMYCIN 0.3 % OP SOLN
1.0000 [drp] | Freq: Four times a day (QID) | OPHTHALMIC | Status: DC
  Filled 2022-10-06: qty 5

## 2022-10-06 MED ORDER — POTASSIUM CHLORIDE CRYS ER 20 MEQ PO TBCR
40.0000 meq | EXTENDED_RELEASE_TABLET | Freq: Once | ORAL | Status: AC
  Administered 2022-10-06: 40 meq via ORAL
  Filled 2022-10-06: qty 2

## 2022-10-06 MED ORDER — ACETAMINOPHEN 500 MG PO TABS
1000.0000 mg | ORAL_TABLET | Freq: Three times a day (TID) | ORAL | Status: DC
  Administered 2022-10-06 – 2022-10-14 (×24): 1000 mg via ORAL
  Filled 2022-10-06 (×24): qty 2

## 2022-10-06 MED ORDER — INSULIN ASPART 100 UNIT/ML IJ SOLN
0.0000 [IU] | Freq: Every day | INTRAMUSCULAR | Status: DC
  Administered 2022-10-09 – 2022-10-12 (×2): 4 [IU] via SUBCUTANEOUS
  Administered 2022-10-13: 5 [IU] via SUBCUTANEOUS

## 2022-10-06 MED ORDER — LIDOCAINE 5 % EX PTCH
1.0000 | MEDICATED_PATCH | Freq: Once | CUTANEOUS | Status: AC
  Administered 2022-10-06: 1 via TRANSDERMAL
  Filled 2022-10-06: qty 1

## 2022-10-06 MED ORDER — INSULIN ASPART 100 UNIT/ML IJ SOLN
0.0000 [IU] | Freq: Three times a day (TID) | INTRAMUSCULAR | Status: DC
  Administered 2022-10-06: 8 [IU] via SUBCUTANEOUS
  Administered 2022-10-06 – 2022-10-07 (×2): 2 [IU] via SUBCUTANEOUS
  Administered 2022-10-08: 8 [IU] via SUBCUTANEOUS
  Administered 2022-10-08: 5 [IU] via SUBCUTANEOUS
  Administered 2022-10-08: 2 [IU] via SUBCUTANEOUS
  Administered 2022-10-09: 11 [IU] via SUBCUTANEOUS
  Administered 2022-10-09: 3 [IU] via SUBCUTANEOUS
  Administered 2022-10-09: 5 [IU] via SUBCUTANEOUS
  Administered 2022-10-10: 8 [IU] via SUBCUTANEOUS
  Administered 2022-10-10: 3 [IU] via SUBCUTANEOUS
  Administered 2022-10-10: 8 [IU] via SUBCUTANEOUS
  Administered 2022-10-11: 3 [IU] via SUBCUTANEOUS
  Administered 2022-10-11 (×2): 5 [IU] via SUBCUTANEOUS
  Administered 2022-10-12: 3 [IU] via SUBCUTANEOUS
  Administered 2022-10-12: 5 [IU] via SUBCUTANEOUS
  Administered 2022-10-13 (×2): 2 [IU] via SUBCUTANEOUS
  Administered 2022-10-13: 11 [IU] via SUBCUTANEOUS
  Administered 2022-10-14: 5 [IU] via SUBCUTANEOUS

## 2022-10-06 MED ORDER — LIDOCAINE 5 % EX PTCH
1.0000 | MEDICATED_PATCH | CUTANEOUS | Status: DC
  Administered 2022-10-07 – 2022-10-14 (×8): 1 via TRANSDERMAL
  Filled 2022-10-06 (×8): qty 1

## 2022-10-06 MED ORDER — INSULIN ASPART 100 UNIT/ML IJ SOLN: 0.0000 [IU] | Freq: Four times a day (QID) | INTRAMUSCULAR | Status: DC

## 2022-10-06 NOTE — Plan of Care (Signed)

## 2022-10-06 NOTE — Consult Note (Signed)
Chief Complaint: Patient was seen in consultation today for  Chief Complaint  Patient presents with   Altered Mental Status    Referring Physician(s): Dr. Eliseo Squires   Supervising Physician: Luanne Bras  Patient Status: [facility] - In-pt  History of Present Illness: Nicholas Burton is a 89 y.o. male with a medical history significant for dementia, DM2, heart failure, CHF and COPD. He presented to the ED 10/05/22 for evaluation of altered mental status. The patient had been complaining of lower back pain for several weeks and imaging obtained showed findings concerning for osteomyelitis versus malignancy at the L3-L4 level.   IMPRESSION: 1. Findings concerning for osteomyelitis discitis at the L3-4 level. Abnormal enhancing material within the ventral epidural space at the level of L3, concerning for epidural phlegmon. These changes superimposed on underlying spondylosis and facet arthrosis resultant severe spinal stenosis. While these changes are favored to reflect infection, a possible neoplastic process involving the L3 vertebral body remains difficult to exclude. [place] interval follow-up imaging may be helpful for further evaluation as warranted. 2. Surrounding paraspinous edema and enhancement, but with no drainable soft tissue collection collections. 3. Underlying advanced multilevel degenerative spondylosis and facet arthrosis as detailed above. No other high-grade spinal stenosis 4. Markedly distended urinary bladder with associated bladder wall trabeculation and/or diverticula. Clinical correlation for chronic outlet obstruction recommended. Mild bilateral hydroureteronephrosis, suspected to be related to bladder distension.  Interventional Radiology has been asked to evaluate this patient for an image-guided bone lesion biopsy. Imaging reviewed and procedure (L3 biopsy) approved by Dr. Estanislado Pandy   Past Medical History:  Diagnosis Date   Allergy    ANEMIA ASSOCIATED  W/OTHER [facility] NUTRITIONAL DEFIC 08/11/2010   Qualifier: Diagnosis of  By: Ronnald Ramp MD, [name].    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    History of skin cancer    Hyperlipidemia with target LDL less than 100 11/10/2008        Hypertension    Memory loss 05/14/2009   Obesity (BMI 30.0-34.9) 04/01/2013   Osteoarthritis    PSA elevation 10/25/2018   SKIN CANCER, HX OF 11/10/2008   Qualifier: Diagnosis of  By: Ronnald Ramp MD, [name].    Snoring 04/03/2014   Type II diabetes mellitus with manifestations ([place]) 11/10/2008   Estimated Creatinine Clearance: 38 mL/min (A) (by C-G formula based on SCr of 1.52 mg/dL (H)).    Venous stasis dermatitis of both lower extremities 01/29/2018   Vitamin D deficiency 11/10/2008    Past Surgical History:  Procedure Laterality Date   CHOLECYSTECTOMY     INGUINAL HERNIA REPAIR     x 3   JOINT REPLACEMENT     KNEE SURGERY     x 2   MELANOMA EXCISION     x 3 -- Left arm   TOTAL KNEE ARTHROPLASTY     x 3    Allergies: Ace inhibitors, Oxycodone-acetaminophen, and Penicillins  Medications: Prior to Admission medications   Medication Sig Start Date End Date Taking? Authorizing Provider  atorvastatin (LIPITOR) 10 MG tablet TAKE 1 TABLET DAILY 09/27/22   Larae Grooms  S, MD  B-D ULTRAFINE III [place] PEN 31G X 8 MM MISC USE TO INJECT INSULIN DAILY 02/01/22   [name], MD  Blood Glucose Monitoring Suppl (FREESTYLE FREEDOM LITE) W/DEVICE KIT Use to check blood sugars daily Dx E11.8 08/21/14   [name], MD  cholecalciferol (VITAMIN D) 1000 units tablet Take 1,000 Units by mouth daily.    [provider]  Colesevelam HCl 3.75 g PACK MIX AND DRINK 1 PACKET DAILY 06/27/22   [name], MD  Continuous Blood Gluc Receiver (FREESTYLE LIBRE 2 READER) DEVI 1 Act by Does not apply route daily. 04/22/21   [name], MD  Continuous Blood Gluc Sensor (FREESTYLE LIBRE 2 SENSOR) MISC 1 Act by Does not apply route daily. 04/22/21   [name], MD  Dietary Management Product (VASCULERA) TABS Take 1 tablet by mouth daily. 04/06/22   [name], MD  empagliflozin (JARDIANCE) 10 MG TABS tablet Take 1 tablet (10 mg total) by mouth daily before breakfast. 08/02/22   [name], MD  FREESTYLE LITE test strip USE AS INSTRUCTED TO CHECK BLOOD SUGAR TWICE A DAY 03/04/21   [name], MD  Glucagon (GVOKE HYPOPEN 2-PACK) 1 MG/0.2ML SOAJ Inject 1 Act into the skin daily as needed. 12/21/20   [name], MD  HYDROcodone-acetaminophen (NORCO/VICODIN) 5-325 MG tablet Take 1 tablet by mouth every 6 (six) hours as needed for moderate pain. 09/21/22   [name], MD  Lancets (FREESTYLE) lancets Use to check blood sugars twice day Dx E11.8 08/21/14   [name], MD  LANTUS SOLOSTAR 100 UNIT/ML Solostar Pen INJECT 0.3ML (30 UNITS TOTAL) INTO THE SKIN AT BEDTIME 12/20/21   [name], MD  losartan (COZAAR) 25 MG tablet TAKE 1 TABLET DAILY 09/27/22   [name], MD  metFORMIN (GLUCOPHAGE) 500 MG tablet TAKE 1 TABLET TWICE A DAY WITH MEALS 09/21/22   [name], MD  methocarbamol (ROBAXIN) 500 MG tablet  08/26/22   [provider]  metoprolol tartrate (LOPRESSOR) 25 MG tablet TAKE ONE-HALF (1/2) TABLET TWICE A DAY 09/27/22   [name], MD  Multiple Vitamins-Minerals (PRESERVISION/LUTEIN) CAPS Take 1 capsule by mouth 2 (two) times daily.    [provider]  nabumetone (RELAFEN) 750 MG tablet  08/26/22   [provider]  torsemide (DEMADEX) 10 MG tablet Take 1 tablet (10 mg total) by mouth daily. Please call our office to schedule a yearly  follow up for future refills. [phone]. Thank you (First attempt) 07/25/22   [name], MD  [facility]) 75 MG TABS TAKE 1 TABLET DAILY 07/25/22   [name], MD  vitamin B-12 (CYANOCOBALAMIN) 500 MCG tablet Take 500 mcg by mouth daily.    [provider]     Family History  Problem Relation Age of Onset   Heart disease Mother    Diabetes Mother    Heart disease Father    Cancer Sister    Cancer Brother    Diabetes Son    Diabetes Other     Social History   Socioeconomic History   Marital status: Married    Spouse name: Not on file   Number of children: 4   Years of education: Not on file   Highest education level: Not on file  Occupational History   Occupation: retired    Fish farm manager: RETIRED  Tobacco Use   Smoking status: Never   Smokeless tobacco: Never  Vaping Use   Vaping Use: Never used  Substance and Sexual Activity   Alcohol use: No   Drug use: No   Sexual activity: Yes    Birth control/protection: None  Other Topics Concern   Not on file  Social History Narrative   No regular exercise   Social Determinants of Health   Financial Resource Strain: Low Risk  (07/11/2022)   Overall Financial Resource Strain (CARDIA)    Difficulty of Paying Living Expenses: Not hard at all  Food Insecurity: No Food Insecurity (07/11/2022)   Hunger Vital Sign    Worried About Running Out of Food in the Last Year: Never true    Ran Out of Food in the Last Year: Never true  Transportation Needs: No Transportation Needs (07/11/2022)   PRAPARE - Hydrologist (Medical): No    Lack of Transportation (Non-Medical): No  Physical Activity: Inactive (07/09/2021)   Exercise Vital Sign    Days of Exercise per Week: 0 days    Minutes of Exercise per Session: 0 min  Stress: No Stress Concern Present (07/11/2022)   [place]    Feeling of Stress : Not at all   Social Connections: Moderately Isolated (07/11/2022)   Social Connection and Isolation Panel [NHANES]    Frequency of Communication with Friends and Family: Twice a week    Frequency of Social Gatherings with Friends and Family: Twice a week    Attends Religious Services: Never    Marine scientist or Organizations: No    Attends Music therapist: Never    Marital Status: Married    Review of Systems: A 12 point ROS discussed and pertinent positives are indicated in the HPI above.  All other systems are negative.  Review of Systems  Unable to perform ROS: Dementia    Vital Signs: BP (!) 172/99 (BP Location: Right Arm) Comment: RN NOTIFIED  Pulse (!) 108 Comment: RN NOTIFIED  Temp 98 F (36.7 C) (Oral)   Resp 20   SpO2 100%   Physical Exam Constitutional:      General: He is not in acute distress.    Appearance: He is not ill-appearing.  HENT:     Ears:     Comments: Hearing aids. Very HOH even with hearing aids  Cardiovascular:     Rate and Rhythm: Normal rate and regular rhythm.     Pulses: Normal pulses.     Heart sounds: Normal heart sounds.  Pulmonary:     Effort: Pulmonary effort is normal.     Breath sounds: Normal breath sounds.  Abdominal:     General: Bowel sounds are normal.     Palpations: Abdomen is soft.     Tenderness: There is no abdominal tenderness.  Musculoskeletal:     Right lower leg: Edema present.     Left lower leg: Edema present.  Skin:    General: Skin is warm and dry.  Neurological:     Mental Status: He is alert. He is disoriented.     Imaging: MR Lumbar Spine W Wo Contrast  Result Date: 10/06/2022 CLINICAL DATA:  Initial evaluation for low back pain, infection. EXAM: MRI LUMBAR SPINE WITHOUT AND WITH CONTRAST TECHNIQUE: Multiplanar and multiecho pulse sequences of the lumbar spine were obtained without and with intravenous contrast. CONTRAST:  7.11m GADAVIST GADOBUTROL 1 MMOL/ML IV SOLN COMPARISON:  CT from earlier  the same day. FINDINGS: Segmentation:  Examination  degraded by motion artifact. Standard segmentation. Lowest well-formed disc space labeled the L5-S1 level. Alignment:  Moderate levoscoliosis.  No significant listhesis. Vertebrae: Abnormal T1 hypointensity, with heterogeneous STIR hyperintensity and enhancement seen within the L3 vertebral body. Edema and enhancement noted within the adjacent L3-4 interspace as well as about the superior endplate of L4. Findings are nonspecific, but most concerning for possible osteomyelitis discitis. Surrounding paraspinous edema and inflammatory changes. No soft tissue collections. Abnormal enhancing material within the ventral epidural space at the level of L3 measures approximately 2.9 x 1.4 cm (series 8, image 19). Findings concerning for epidural phlegmon. 1.6 cm nonenhancing material at the level could reflect a small epidural abscess versus disc protrusion (series 8, image 17). Ventral epidural enhancement extends from the L1-2 through L4-5 interspace. While these findings are favored to be infectious/inflammatory in nature, a possible underlying neoplastic process involving the L3 vertebral body remains difficult to exclude. No other convincing evidence for acute infection elsewhere within the lumbar spine. Reactive endplate changes about the L1-2, L2-3, and L5-S1 interspaces favored to be degenerative. Mild edema and enhancement about the L2-3 and L3-4 facets also favored to be degenerative. Underlying bone marrow signal intensity diffusely heterogeneous. No other worrisome osseous lesions. Conus medullaris and cauda equina: Conus extends to the T12 level. Conus and cauda equina appear normal. Paraspinal and other soft tissues: Paraspinous edema and enhancement adjacent to the L3-4 interspace as above. No collections. Partially visualized urinary bladder is markedly distended with associated bladder wall trabeculation and/or diverticula. Mild bilateral  hydroureteronephrosis, suspected to be related to bladder distension. Disc levels: L1-2: Advanced degenerative intervertebral disc space narrowing with diffuse disc bulge and disc desiccation. Reactive endplate spurring. Moderate right worse than left facet and ligament flavum hypertrophy. No significant spinal stenosis. Foramina remain patent. L2-3: Moderate degenerative intervertebral disc space narrowing with diffuse disc bulge, asymmetric to the left. Associated reactive endplate spurring. 1.6 cm nonenhancing material within the right ventral epidural space could reflect a right subarticular disc protrusion or possibly epidural abscess (series 8, image 17). Moderate facet and ligament flavum hypertrophy. Resultant mild canal with moderate left lateral recess stenosis. Moderate left L2 foraminal narrowing. Right neural foramen remains patent. L3-4: Advanced intervertebral disc space narrowing with findings concerning for osteomyelitis discitis as above. Probable epidural phlegmon within the ventral epidural space at the level of L3 (series 8, image 19). Moderate left worse than right facet arthrosis. Resultant severe spinal stenosis. Moderate to severe left with mild right L3 foraminal narrowing. L4-5: Degenerative intervertebral disc space narrowing with disc desiccation and diffuse disc bulge. Left-sided reactive endplate spurring. Moderate facet hypertrophy. Resultant mild narrowing of the left lateral recess. Central canal remains patent. Mild to moderate left L4 foraminal narrowing. Right neural foramen remains patent. L5-S1: Degenerative intervertebral disc space narrowing with disc desiccation and diffuse disc bulge. Reactive endplate spurring. Moderate bilateral facet arthrosis. No significant spinal stenosis. Mild bilateral L5 foraminal narrowing. IMPRESSION: 1. Findings concerning for osteomyelitis discitis at the L3-4 level. Abnormal enhancing material within the ventral epidural space at the level of  L3, concerning for epidural phlegmon. These changes superimposed on underlying spondylosis and facet arthrosis resultant severe spinal stenosis. While these changes are favored to reflect infection, a possible neoplastic process involving the L3 vertebral body remains difficult to exclude. [place] interval follow-up imaging may be helpful for further evaluation as warranted. 2. Surrounding paraspinous edema and enhancement, but with no drainable soft tissue collection collections. 3. Underlying advanced multilevel degenerative spondylosis and facet arthrosis as  detailed above. No other high-grade spinal stenosis 4. Markedly distended urinary bladder with associated bladder wall trabeculation and/or diverticula. Clinical correlation for chronic outlet obstruction recommended. Mild bilateral hydroureteronephrosis, suspected to be related to bladder distension. Electronically Signed   By: Jeannine Boga M.D.   On: 10/06/2022 00:47   CT Lumbar Spine Wo Contrast  Result Date: 10/05/2022 CLINICAL DATA:  Low back pain.  Increased fracture risk. EXAM: CT LUMBAR SPINE WITHOUT CONTRAST TECHNIQUE: Multidetector CT imaging of the lumbar spine was performed without intravenous contrast administration. Multiplanar CT image reconstructions were also generated. RADIATION DOSE REDUCTION: This exam was performed according to the departmental dose-optimization program which includes automated exposure control, adjustment of the mA and/or kV according to patient size and/or use of iterative reconstruction technique. COMPARISON:  Radiography 07/26/2022 FINDINGS: Segmentation: 5 lumbar type vertebral bodies. Alignment: Chronic mild S shaped scoliotic curvature. Vertebrae: Chronic degenerative endplate changes at the L1-2 level. Newly seen lytic destructive pattern throughout the L3 vertebral body, worse towards the left side. Question some involvement of the L2 vertebral body and superior aspect of the L4 vertebral body. Lucent  focus within the central portion of the S1 vertebral body. These findings could be due to metastatic disease, infection or both. See below. Paraspinal and other soft tissues: Distended bladder. Aortic atherosclerosis. Pronounced edematous change in the soft tissues surrounding the L3 vertebral body, worrisome in particular for infection but possibly consistent with tumor as well. Disc levels: T9-10 through T12-L1: Negative L1-2: Chronic disc degeneration with loss of disc height. Endplate osteophytes and mild bulging of the disc. No compressive stenosis. L2-3: Disc space narrowing and vacuum phenomenon. Circumferential bulging of the disc. Facet and ligamentous hypertrophy. Multifactorial stenosis at this level that could cause neural compression. As noted above, there may be some lytic changes of the inferior L2 vertebral body. This raises the possibility of discitis osteomyelitis involving this level. L3-4: Lytic destructive appearance of the L3 vertebral body with loss of height more on the left. Edematous change of the paravertebral soft tissues. Bulging of the disc. Facet and ligamentous hypertrophy. Severe stenosis at this level. Epidural abscess not excluded. As noted above, there could be lesser destructive change of superior L4. Certainly the findings at this level or worrisome for discitis osteomyelitis. L4-5: Chronic disc degeneration with loss of disc height. Bulging of the disc. Facet and ligamentous hypertrophy. Moderate degenerative type stenosis. L5-S1: Chronic disc degeneration with loss of disc height. Endplate osteophytes and bulging of the disc. Facet degeneration. No canal stenosis. Mild bilateral foraminal stenosis. Lucent area within the S1 vertebral body which is indeterminate. IMPRESSION: 1. Lytic destructive pattern throughout the L3 vertebral body with loss of height more on the left. Question some involvement of the L2 vertebral body and superior aspect of the L4 vertebral body.  Pronounced edematous change in the paravertebral soft tissues surrounding the L3 vertebral body, worrisome for infection but possibly consistent with tumor as well. 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 Severe stenosis at the L2-3 and L3-4 levels, possibly resulting in neurogenic bladder. Epidural abscess is not excluded. Is the patient an MR candidate? 2. Lucent area within the S1 vertebral body which is indeterminate. 3. Chronic degenerative changes at L1-2, L4-5 and L5-S1 as above. Aortic Atherosclerosis (ICD10-I70.0). Electronically Signed   By: Nelson Chimes M.D.   On: 10/05/2022 21:20   DG Chest 1 View  Result Date: 10/05/2022 CLINICAL DATA:  Altered mental status EXAM: CHEST  1 VIEW COMPARISON:  06/02/2021 FINDINGS: Heart is normal size. Mediastinal contours within normal limits. Bibasilar atelectasis. No effusions or pneumothorax. No acute bony abnormality. IMPRESSION: Bibasilar atelectasis. Electronically Signed   By: Rolm Baptise M.D.   On: 10/05/2022 21:13   DG Pelvis 1-2 Views  Result Date: 10/05/2022 CLINICAL DATA:  Left hip and pelvic pain EXAM: PELVIS - 1-2 VIEW COMPARISON:  None Available. FINDINGS: Mild symmetric degenerative changes in the hips with joint space narrowing and spurring. No acute bony abnormality. Specifically, no fracture, subluxation, or dislocation. Degenerative changes in the visualized lower lumbar spine. IMPRESSION: No acute bony abnormality. Electronically Signed   By: Rolm Baptise M.D.   On: 10/05/2022 21:13   CT Head Wo Contrast  Result Date: 10/05/2022 CLINICAL DATA:  Delirium.  Low back pain. EXAM: CT HEAD WITHOUT CONTRAST TECHNIQUE: Contiguous axial images were obtained from the base of the skull through the vertex without intravenous contrast. RADIATION DOSE REDUCTION: This exam was performed according to the departmental dose-optimization program which includes automated exposure control, adjustment of the mA and/or kV according  to patient size and/or use of iterative reconstruction technique. COMPARISON:  MRI 02/28/2004 FINDINGS: Brain: Generalized brain volume loss. Moderate chronic small-vessel ischemic changes of the white matter. No sign of acute infarction, mass lesion, hemorrhage, hydrocephalus or extra-axial collection. Vascular: There is atherosclerotic calcification of the major vessels at the base of the brain. Skull: Negative Sinuses/Orbits: Opacified right maxillary sinus.  Orbits negative. Other: None IMPRESSION: 1. No acute CT finding. Atrophy and chronic small-vessel ischemic changes of the white matter. 2. Opacified right maxillary sinus. Electronically Signed   By: Nelson Chimes M.D.   On: 10/05/2022 21:11    Labs:  CBC: Recent Labs    12/28/21 1501 07/26/22 1406 10/05/22 2022 10/05/22 2032 10/06/22 0216  WBC 11.7* 11.9* 13.8*  --  12.2*  HGB 12.2* 12.9* 12.9* 12.9* 12.5*  HCT 37.0* 38.7* 39.1 38.0* 36.6*  PLT 229.0 278.0 201  --  182    COAGS: Recent Labs    12/28/21 1501 10/06/22 0216  INR 0.9 1.1  APTT 29.8  --     BMP: Recent Labs    10/26/21 0918 07/26/22 1406 10/05/22 2022 10/05/22 2032 10/06/22 0216  NA 140 140 136 138 137  K 4.1 3.5 3.3* 3.3* 3.3*  CL 104 103 104  --  104  CO2 '29 31 22  '$ --  22  GLUCOSE 95 180* 215*  --  187*  BUN 25* 17 15  --  13  CALCIUM 8.9 9.1 8.8*  --  8.7*  CREATININE 1.58* 1.34 1.35*  --  1.22  GFRNONAA  --   --  49*  --  56*    LIVER FUNCTION TESTS: Recent Labs    07/26/22 1406 10/05/22 2022 10/06/22 0216  BILITOT 0.5 0.8 0.8  AST '19 24 23  '$ ALT '16 21 19  '$ ALKPHOS 119* 135* 130*  PROT 6.6 5.9* 5.4*  ALBUMIN 3.8 3.3* 3.0*    TUMOR MARKERS: No results for input(s): "AFPTM", "CEA", "CA199", "CHROMGRNA" in the last 8760 hours.  Assessment and Plan:  Osteomyelitis versus malignancy: [name]. Miah, 89 year old male, is tentatively scheduled 10/07/22 for an image-guided L3 bone lesion biopsy. The procedure was discussed with two of his  sons at the bedside.   Risks and benefits of this procedure were discussed with the patient and/or patient's family including, but not limited to bleeding, infection, damage to adjacent structures or low yield requiring additional tests.  All of the  questions were answered and there is agreement to proceed. He will be NPO at midnight. Morning labs ordered.   Consent signed and in chart.  Thank you for this interesting consult.  I greatly enjoyed meeting JAXTEN BROSH and look forward to participating in their care.  A copy of this report was sent to the requesting provider on this date.  Electronically Signed: Soyla Dryer, AGACNP-BC [phone] 10/06/2022, 1:18 PM   I spent a total of 20 Minutes    in face to face in clinical consultation, greater than 50% of which was counseling/coordinating care for L3 bone lesion biopsy

## 2022-10-06 NOTE — Plan of Care (Signed)
  Problem: Activity: Goal: Risk for activity intolerance will decrease Outcome: Progressing   Problem: Skin Integrity: Goal: Risk for impaired skin integrity will decrease Outcome: Progressing   

## 2022-10-06 NOTE — Consult Note (Signed)
Palliative Medicine Inpatient Consult Note  Consulting Provider: Dr. Eliseo Squires  Reason for consult:   [place] Palliative Medicine Consult  Reason for Consult? goc   10/06/2022  HPI:  Per intake H&P --> 89 year old gentleman with confusion and altered mental status multiple medical comorbidities possible encephalopathy with CT and MRI evidence of discitis osteomyelitis and a component of epidural abscess.   The palliative care team has been asked to get involved for further goals of care conversations.  Clinical Assessment/Goals of Care:  *Please note that this is a verbal dictation therefore any spelling or grammatical errors are due to the "[place] [employer]" system interpretation.  I have reviewed medical records including EPIC notes, labs and imaging, received report from bedside RN who shares patient is eating 50% of meals, assessed the patient who is getting back into bed from the commode.     I met with Haroldine Laws to further discuss diagnosis prognosis, GOC, EOL wishes, disposition and options.   I introduced Palliative Medicine as specialized medical care for people living with serious illness. It focuses on providing relief from the symptoms and stress of a serious illness. The goal is to improve quality of life for both the patient and the family.  Medical History Review and Understanding:  A review of Zhaire's past medical history inclusive of type 2 diabetes, osteoarthritis, hypertension, hyperlipidemia, anemia, COPD, and heart failure was held.  Social History:  Keshawn lives in [place].  He has been married to his wife [name] for the past 73 years and they share 4 sons, 6 grandchildren and 4 great-grandchildren.  Trevyn's career was in [employer] for which he retired after 27 years of service.  Ander was active in [place].  Selvin gets enjoyment out of spending time with his family.  Travarius is a man of faith and practices within the [facility]  denomination.  Functional and Nutritional State:  Prior to hospitalization Abdikadir considers himself to have been active proceeding a few weeks ago when he started to have increased back pain.  Patient's mobility as a result of his lower back pain has been limited though he is still been able to mobilize with a front wheel walker.  His children are able to rotate in and out of the home as his son [name] lives only a mile and a half away.  Advance Directives:  A detailed discussion was had today regarding advanced directives.  Patient does not have advanced directives though would be interested in completing those during hospitalization.  Code Status:  Concepts specific to code status, artifical feeding and hydration, continued IV antibiotics and rehospitalization was had.  The difference between a aggressive medical intervention path  and a palliative comfort care path for this patient at this time was had.   Encouraged patient/family to consider DNR/DNI status understanding evidenced based poor outcomes in similar hospitalized patient, as the cause of arrest is likely associated with advanced chronic/terminal illness rather than an easily reversible acute cardio-pulmonary event. I explained that DNR/DNI does not change the medical plan and it only comes into effect after a person has arrested (died).  It is a protective measure to keep [place] from harming the patient in their last moments of life.   Patient at this time would like continued efforts made and has elected to be a full code.  If for any reason he were in a state which would cause him long-term deficits he would want to not prolong life-sustaining measures  Discussion:  I reviewed with Bartosz that he has an infection of his lower back.  We were joined while talking by the infectious disease team who recommend a biopsy of his lower spine to better help determine treatment options.  Patient shares that he is open to this intervention.  I was  able to explain what discitis is and the concerns in the long term regarding this diagnosis inclusive of ongoing pain and further mobility deterioration.   Patient shares that he has a very hard time hearing.  He does have hearing aids and is agreeable to my calling his family to bring them in so that we may talk more.  Patient's personal goals are to have relief of his lower back pain and treatment of this infection.  He would like to be able to participate in self-care again.  Discussed the importance of continued conversation with family and their  medical providers regarding overall plan of care and treatment options, ensuring decisions are within the context of the patients values and GOCs. ______________________________________________ Addendum:  I spoke to patient's wife [name] and son [name]. We reviewed the possibility of IR pursuing a biopsy of patients spine. Patients wife [name] shares that she would like to pursue this presently. Updates on the above conversation provided.   Goals for improvement at this time.   Decision Maker: [place] (Spouse): [phone] (Home Phone)   SUMMARY OF RECOMMENDATIONS   FULL CODE/FULL SCOPE OF CARE  Patients family is agreeable to IR biopsy  Goals at this time are for improvement  Weakness: PT/OT  Pain in lower pack: Tylenol 1g TID and Lidoderm patch (on 12 hrs off 12 hrs)  Ongoing Palliative support  Code Status/Advance Care Planning: FULL CODE   Palliative Prophylaxis:  Aspiration, Bowel Regimen, Delirium Protocol, Frequent Pain Assessment, Oral Care, Palliative Wound Care, and Turn Reposition  Additional Recommendations (Limitations, Scope, Preferences): Continue present care  Psycho-social/Spiritual:  Desire for further Chaplaincy support: Not presently Additional Recommendations: Education on discitis   Prognosis: Unclear though patient has multiple chronic disease conditions which place him at higher mortality risk to begin  with.  Discharge Planning: Discharge will depend upon medical workup, treatment, and therapy recommendations  Vitals:   10/06/22 0505 10/06/22 0730  BP: (!) 149/75 (!) 159/75  Pulse: 95 99  Resp: 20 18  Temp:  98 F (36.7 C)  SpO2: 98% 99%   No intake or output data in the 24 hours ending 10/06/22 0820  Gen: Elderly caucasian M in NAD HEENT: Dry mucous membranes CV: Regular rate and rhythm  PULM:  On RA, breathing is even and nonlabored ABD: soft/nontender  EXT: No edema  Neuro: Alert and oriented x3 --> exceptionally hard of hearing  PPS: 50%   This conversation/these recommendations were discussed with patient primary care team, Dr. Eliseo Squires  Billing based on MDM: High ______________________________________________________ [facility] Team Team Cell Phone: [phone] Please utilize secure chat with additional questions, if there is no response within 30 minutes please call the above phone number  Palliative Medicine Team providers are available by phone from 7am to 7pm daily and can be reached through the team cell phone.  Should this patient require assistance outside of these hours, please call the patient's attending physician.

## 2022-10-06 NOTE — ED Notes (Signed)
ED TO INPATIENT HANDOFF REPORT  ED Nurse Name and Phone #: [phone]  S Name/Age/Gender Nicholas Burton 89 y.o. male Room/Bed: 017C/017C  Code Status   Code Status: Full Code  Home/SNF/Other Home Patient oriented to: self, place, and time Is this baseline? Yes   Triage Complete: Triage complete  Chief Complaint Acute encephalopathy [G93.40]  Triage Note Pt bib [facility] from home where family noticed the patient was not oriented to place. Pt is baseline oriented x 4. Pt had two cataract shots today which is causing him to be unable to open his eyes. [id number] vitals: 148/90, 90-106 HR, 99% RA, 198 CBG   Allergies Allergies  Allergen Reactions   Ace Inhibitors     REACTION: cough   Oxycodone-Acetaminophen    Penicillins     Level of Care/Admitting Diagnosis ED Disposition     ED Disposition  Admit   Condition  --   Comment  Hospital Area: [facility] [[id number]]  Level of Care: Telemetry Medical [104]  May admit patient to [facility] or [facility] if equivalent level of care is available:: No  Covid Evaluation: Asymptomatic - no recent exposure (last 10 days) testing not required  Diagnosis: Acute encephalopathy [270350]  Admitting Physician: Rhetta Mura [[id number]]  Attending Physician: Rhetta Mura [[id number]]  Certification:: I certify this patient will need inpatient services for at least 2 midnights  Estimated Length of Stay: 2          B Medical/Surgery History Past Medical History:  Diagnosis Date   Allergy    ANEMIA ASSOCIATED W/OTHER [facility] NUTRITIONAL DEFIC 08/11/2010   Qualifier: Diagnosis of  By: Ronnald Ramp MD, [name].    Cancer [facility])    skin   Cataract    Chronic combined systolic and diastolic congestive heart failure ([place]) 10/30/2018   COPD with asthma 03/02/2017   CRI (chronic renal insufficiency), stage 3 (moderate) (HCC) 10/25/2018   Deficiency anemia 10/25/2018   Diabetes mellitus    type 2   DOE (dyspnea on  exertion) 09/03/2018   Edema 06/11/2009   Qualifier: Diagnosis of  By: Ronnald Ramp MD, [name].    History of skin cancer    Hyperlipidemia with target LDL less than 100 11/10/2008        Hypertension    Memory loss 05/14/2009   Obesity (BMI 30.0-34.9) 04/01/2013   Osteoarthritis    PSA elevation 10/25/2018   SKIN CANCER, HX OF 11/10/2008   Qualifier: Diagnosis of  By: Ronnald Ramp MD, [name].    Snoring 04/03/2014   Type II diabetes mellitus with manifestations ([place]) 11/10/2008   Estimated Creatinine Clearance: 38 mL/min (A) (by C-G formula based on SCr of 1.52 mg/dL (H)).    Venous stasis dermatitis of both lower extremities 01/29/2018   Vitamin D deficiency 11/10/2008   Past Surgical History:  Procedure Laterality Date   CHOLECYSTECTOMY     INGUINAL HERNIA REPAIR     x 3   JOINT REPLACEMENT     KNEE SURGERY     x 2   MELANOMA EXCISION     x 3 -- Left arm   TOTAL KNEE ARTHROPLASTY     x 3     A IV Location/Drains/Wounds Patient Lines/Drains/Airways Status     Active Line/Drains/Airways     None            Intake/Output Last 24 hours No intake or output data in the 24 hours ending 10/06/22 0012  Labs/Imaging Results for  orders placed or performed during the hospital encounter of 10/05/22 (from the past 48 hour(s))  CBC with Differential     Status: Abnormal   Collection Time: 10/05/22  8:22 PM  Result Value Ref Range   WBC 13.8 (H) 4.0 - 10.5 K/uL   RBC 4.43 4.22 - 5.81 MIL/uL   Hemoglobin 12.9 (L) 13.0 - 17.0 g/dL   HCT 39.1 39.0 - 52.0 %   MCV 88.3 80.0 - 100.0 fL   MCH 29.1 26.0 - 34.0 pg   MCHC 33.0 30.0 - 36.0 g/dL   RDW 14.9 11.5 - 15.5 %   Platelets 201 150 - 400 K/uL   nRBC 0.0 0.0 - 0.2 %   Neutrophils Relative % 61 %   Neutro Abs 8.4 (H) 1.7 - 7.7 K/uL   Lymphocytes Relative 24 %   Lymphs Abs 3.3 0.7 - 4.0 K/uL   Monocytes Relative 14 %   Monocytes Absolute 1.9 (H) 0.1 - 1.0 K/uL   Eosinophils Relative 1 %   Eosinophils Absolute 0.2 0.0 - 0.5 K/uL    Basophils Relative 0 %   Basophils Absolute 0.0 0.0 - 0.1 K/uL   Immature Granulocytes 0 %   Abs Immature Granulocytes 0.05 0.00 - 0.07 K/uL    Comment: Performed at [facility], [address] [place], [place] [address]  Comprehensive metabolic panel     Status: Abnormal   Collection Time: 10/05/22  8:22 PM  Result Value Ref Range   Sodium 136 135 - 145 mmol/L   Potassium 3.3 (L) 3.5 - 5.1 mmol/L   Chloride 104 98 - 111 mmol/L   CO2 22 22 - 32 mmol/L   Glucose, Bld 215 (H) 70 - 99 mg/dL    Comment: Glucose reference range applies only to samples taken after fasting for at least 8 hours.   BUN 15 8 - 23 mg/dL   Creatinine, Ser 1.35 (H) 0.61 - 1.24 mg/dL   Calcium 8.8 (L) 8.9 - 10.3 mg/dL   Total Protein 5.9 (L) 6.5 - 8.1 g/dL   Albumin 3.3 (L) 3.5 - 5.0 g/dL   AST 24 15 - 41 U/L   ALT 21 0 - 44 U/L   Alkaline Phosphatase 135 (H) 38 - 126 U/L   Total Bilirubin 0.8 0.3 - 1.2 mg/dL   GFR, Estimated 49 (L) >60 mL/min    Comment: (NOTE) Calculated using the CKD-EPI Creatinine Equation (2021)    Anion gap 10 5 - 15    Comment: Performed at [facility], [place] [address], [place], [place] [address]  Lipase, blood     Status: None   Collection Time: 10/05/22  8:22 PM  Result Value Ref Range   Lipase 30 11 - 51 U/L    Comment: Performed at [facility], [place] [address], [place], [place] [address]  Ammonia     Status: None   Collection Time: 10/05/22  8:22 PM  Result Value Ref Range   Ammonia <10 9 - 35 umol/L    Comment: Performed at [facility], [place] [address], [place], [place] [address]  Ethanol     Status: Abnormal   Collection Time: 10/05/22  8:22 PM  Result Value Ref Range   Alcohol, Ethyl (B) 11 (H) <10 mg/dL    Comment: (NOTE) Lowest detectable limit for serum alcohol is 10 mg/dL.  For medical purposes only. Performed at [facility], [place] [address] [place],  [address]  Magnesium     Status: None   Collection Time: 10/05/22  8:22  PM  Result Value Ref Range   Magnesium 2.2 1.7 - 2.4 mg/dL    Comment: Performed at [facility], [place] [address] [place], [place] [address]  I-Stat venous blood gas, ED     Status: Abnormal   Collection Time: 10/05/22  8:32 PM  Result Value Ref Range   pH, Ven 7.375 7.25 - 7.43   pCO2, Ven 39.6 (L) 44 - 60 mmHg   pO2, Ven 27 (LL) 32 - 45 mmHg   Bicarbonate 23.1 20.0 - 28.0 mmol/L   TCO2 24 22 - 32 mmol/L   O2 Saturation 48 %   Acid-base deficit 2.0 0.0 - 2.0 mmol/L   Sodium 138 135 - 145 mmol/L   Potassium 3.3 (L) 3.5 - 5.1 mmol/L   Calcium, Ion 1.19 1.15 - 1.40 mmol/L   HCT 38.0 (L) 39.0 - 52.0 %   Hemoglobin 12.9 (L) 13.0 - 17.0 g/dL   Sample type VENOUS    Comment NOTIFIED PHYSICIAN   Urinalysis, Routine w reflex microscopic -Urine, Clean Catch     Status: Abnormal   Collection Time: 10/05/22  9:25 PM  Result Value Ref Range   Color, Urine YELLOW YELLOW   APPearance HAZY (A) CLEAR   Specific Gravity, Urine 1.010 1.005 - 1.030   pH 5.0 5.0 - 8.0   Glucose, UA 50 (A) NEGATIVE mg/dL   Hgb urine dipstick NEGATIVE NEGATIVE   Bilirubin Urine NEGATIVE NEGATIVE   Ketones, ur NEGATIVE NEGATIVE mg/dL   Protein, ur NEGATIVE NEGATIVE mg/dL   Nitrite NEGATIVE NEGATIVE   Leukocytes,Ua NEGATIVE NEGATIVE   RBC / HPF 0-5 0 - 5 RBC/hpf   WBC, UA 0-5 0 - 5 WBC/hpf   Bacteria, UA RARE (A) NONE SEEN   Squamous Epithelial / HPF 0-5 0 - 5 /HPF   Mucus PRESENT     Comment: Performed at [facility], [place] [address], [place], [place] [address]  Resp panel by RT-PCR (RSV, Flu A&B, Covid) Anterior Nasal Swab     Status: None   Collection Time: 10/05/22 10:18 PM   Specimen: Anterior Nasal Swab  Result Value Ref Range   SARS Coronavirus 2 by RT PCR NEGATIVE NEGATIVE   Influenza A by PCR NEGATIVE NEGATIVE   Influenza B by PCR NEGATIVE NEGATIVE    Comment: (NOTE) The Xpert Xpress SARS-CoV-2/FLU/RSV plus assay is intended as an aid in the diagnosis of influenza from Nasopharyngeal  swab specimens and should not be used as a sole basis for treatment. Nasal washings and aspirates are unacceptable for Xpert Xpress SARS-CoV-2/FLU/RSV testing.  Fact Sheet for Patients: EntrepreneurPulse.com.au  Fact Sheet for Healthcare Providers: IncredibleEmployment.be  This test is not yet approved or cleared by the [place] FDA and has been authorized for detection and/or diagnosis of SARS-CoV-2 by FDA under an Emergency Use Authorization (EUA). This EUA will remain in effect (meaning this test can be used) for the duration of the COVID-19 declaration under Section 564(b)(1) of the Act, 21 U.S.C. section 360bbb-3(b)(1), unless the authorization is terminated or revoked.     Resp Syncytial Virus by PCR NEGATIVE NEGATIVE    Comment: (NOTE) Fact Sheet for Patients: EntrepreneurPulse.com.au  Fact Sheet for Healthcare Providers: IncredibleEmployment.be  This test is not yet approved or cleared by the [place] FDA and has been authorized for detection and/or diagnosis of SARS-CoV-2 by FDA under an Emergency Use Authorization (EUA). This EUA  will remain in effect (meaning this test can be used) for the duration of the COVID-19 declaration under Section 564(b)(1) of the Act, 21 U.S.C. section 360bbb-3(b)(1), unless the authorization is terminated or revoked.  Performed at [facility], [place] [address] [place], [place] [address]    CT Lumbar Spine Wo Contrast  Result Date: 10/05/2022 CLINICAL DATA:  Low back pain.  Increased fracture risk. EXAM: CT LUMBAR SPINE WITHOUT CONTRAST TECHNIQUE: Multidetector CT imaging of the lumbar spine was performed without intravenous contrast administration. Multiplanar CT image reconstructions were also generated. RADIATION DOSE REDUCTION: This exam was performed according to the departmental dose-optimization program which includes automated exposure control,  adjustment of the mA and/or kV according to patient size and/or use of iterative reconstruction technique. COMPARISON:  Radiography 07/26/2022 FINDINGS: Segmentation: 5 lumbar type vertebral bodies. Alignment: Chronic mild S shaped scoliotic curvature. Vertebrae: Chronic degenerative endplate changes at the L1-2 level. Newly seen lytic destructive pattern throughout the L3 vertebral body, worse towards the left side. Question some involvement of the L2 vertebral body and superior aspect of the L4 vertebral body. Lucent focus within the central portion of the S1 vertebral body. These findings could be due to metastatic disease, infection or both. See below. Paraspinal and other soft tissues: Distended bladder. Aortic atherosclerosis. Pronounced edematous change in the soft tissues surrounding the L3 vertebral body, worrisome in particular for infection but possibly consistent with tumor as well. Disc levels: T9-10 through T12-L1: Negative L1-2: Chronic disc degeneration with loss of disc height. Endplate osteophytes and mild bulging of the disc. No compressive stenosis. L2-3: Disc space narrowing and vacuum phenomenon. Circumferential bulging of the disc. Facet and ligamentous hypertrophy. Multifactorial stenosis at this level that could cause neural compression. As noted above, there may be some lytic changes of the inferior L2 vertebral body. This raises the possibility of discitis osteomyelitis involving this level. L3-4: Lytic destructive appearance of the L3 vertebral body with loss of height more on the left. Edematous change of the paravertebral soft tissues. Bulging of the disc. Facet and ligamentous hypertrophy. Severe stenosis at this level. Epidural abscess not excluded. As noted above, there could be lesser destructive change of superior L4. Certainly the findings at this level or worrisome for discitis osteomyelitis. L4-5: Chronic disc degeneration with loss of disc height. Bulging of the disc. Facet and  ligamentous hypertrophy. Moderate degenerative type stenosis. L5-S1: Chronic disc degeneration with loss of disc height. Endplate osteophytes and bulging of the disc. Facet degeneration. No canal stenosis. Mild bilateral foraminal stenosis. Lucent area within the S1 vertebral body which is indeterminate. IMPRESSION: 1. Lytic destructive pattern throughout the L3 vertebral body with loss of height more on the left. Question some involvement of the L2 vertebral body and superior aspect of the L4 vertebral body. Pronounced edematous change in the paravertebral soft tissues surrounding the L3 vertebral body, worrisome for infection but possibly consistent with tumor as well. 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 Severe stenosis at the L2-3 and L3-4 levels, possibly resulting in neurogenic bladder. Epidural abscess is not excluded. Is the patient an MR candidate? 2. Lucent area within the S1 vertebral body which is indeterminate. 3. Chronic degenerative changes at L1-2, L4-5 and L5-S1 as above. Aortic Atherosclerosis (ICD10-I70.0). Electronically Signed   By: Nelson Chimes M.D.   On: 10/05/2022 21:20   DG Chest 1 View  Result Date: 10/05/2022 CLINICAL DATA:  Altered mental status EXAM: CHEST  1 VIEW COMPARISON:  06/02/2021  FINDINGS: Heart is normal size. Mediastinal contours within normal limits. Bibasilar atelectasis. No effusions or pneumothorax. No acute bony abnormality. IMPRESSION: Bibasilar atelectasis. Electronically Signed   By: Rolm Baptise M.D.   On: 10/05/2022 21:13   DG Pelvis 1-2 Views  Result Date: 10/05/2022 CLINICAL DATA:  Left hip and pelvic pain EXAM: PELVIS - 1-2 VIEW COMPARISON:  None Available. FINDINGS: Mild symmetric degenerative changes in the hips with joint space narrowing and spurring. No acute bony abnormality. Specifically, no fracture, subluxation, or dislocation. Degenerative changes in the visualized lower lumbar spine. IMPRESSION: No acute bony  abnormality. Electronically Signed   By: Rolm Baptise M.D.   On: 10/05/2022 21:13   CT Head Wo Contrast  Result Date: 10/05/2022 CLINICAL DATA:  Delirium.  Low back pain. EXAM: CT HEAD WITHOUT CONTRAST TECHNIQUE: Contiguous axial images were obtained from the base of the skull through the vertex without intravenous contrast. RADIATION DOSE REDUCTION: This exam was performed according to the departmental dose-optimization program which includes automated exposure control, adjustment of the mA and/or kV according to patient size and/or use of iterative reconstruction technique. COMPARISON:  MRI 02/28/2004 FINDINGS: Brain: Generalized brain volume loss. Moderate chronic small-vessel ischemic changes of the white matter. No sign of acute infarction, mass lesion, hemorrhage, hydrocephalus or extra-axial collection. Vascular: There is atherosclerotic calcification of the major vessels at the base of the brain. Skull: Negative Sinuses/Orbits: Opacified right maxillary sinus.  Orbits negative. Other: None IMPRESSION: 1. No acute CT finding. Atrophy and chronic small-vessel ischemic changes of the white matter. 2. Opacified right maxillary sinus. Electronically Signed   By: Nelson Chimes M.D.   On: 10/05/2022 21:11    Pending Labs Unresulted Labs (From admission, onward)     Start     Ordered   10/06/22 0500  CBC with Differential/Platelet  Tomorrow morning,   R        10/05/22 2323   10/06/22 0500  Comprehensive metabolic panel  Tomorrow morning,   R        10/05/22 2323   10/06/22 0500  Magnesium  Tomorrow morning,   R        10/05/22 2323   10/06/22 0500  Protime-INR  Tomorrow morning,   R        10/05/22 2323   10/05/22 2323  Type and screen [place]  Once,   R       Comments: [place]    10/05/22 2323   10/05/22 2016  Blood gas, venous (at [facility])  Once,   R        10/05/22 2016   10/05/22 2016  Rapid urine drug screen (hospital performed)  ONCE - STAT,    STAT        10/05/22 2016            Vitals/Pain Today's Vitals   10/05/22 2013 10/05/22 2215 10/06/22 0012  BP: (!) 149/77 128/74   Pulse: 93 91   Resp: 20 (!) 21   Temp: 98.4 F (36.9 C)  98.4 F (36.9 C)  TempSrc: Oral  Oral  SpO2: 98% 100%     Isolation Precautions Airborne and Contact precautions  Medications Medications  acetaminophen (TYLENOL) tablet 650 mg (has no administration in time range)    Or  acetaminophen (TYLENOL) suppository 650 mg (has no administration in time range)  melatonin tablet 3 mg (has no administration in time range)  naloxone (NARCAN) injection 0.4 mg (has no administration in  time range)  fentaNYL (SUBLIMAZE) injection 12.5 mcg (has no administration in time range)  ondansetron (ZOFRAN) injection 4 mg (has no administration in time range)  gadobutrol (GADAVIST) 1 MMOL/ML injection 7.5 mL (7.5 mLs Intravenous Contrast Given 10/05/22 2304)    Mobility walks with device     Focused Assessments   Back and hip pain/tenderness/ no noted deformities   R Recommendations: See Admitting Provider Note  Report given to:   Additional Notes:   Pt a/ox3 at baseline/ hx of dementia/ ambulatory with x2 assist

## 2022-10-06 NOTE — Progress Notes (Signed)
Mobility Specialist Progress Note    10/06/22 1534  Mobility  Activity Ambulated with assistance in room  Level of Assistance Minimal assist, patient does 75% or more  Assistive Device Front wheel walker  Distance Ambulated (ft) 4 ft  Activity Response Tolerated well  Mobility Referral Yes  $Mobility charge 1 Mobility   Pt received in bed and agreeable to get up to chair. No complaints. Left with call bell in reach and chair alarm on. RN notified.   Hildred Alamin Mobility Specialist  Please Psychologist, sport and exercise or Rehab Office at [phone]

## 2022-10-06 NOTE — Consult Note (Signed)
Reason for Consult: Lower extremity weakness discitis osteomyelitis Referring Physician: [facility] hospital[facility]  Nicholas Burton is an 89 y.o. male.  HPI: Patient with 2 weeks of back pain reportedly and altered mental status over the last couple days.  Patient has significant past medical history of dementia type 2 diabetes heart failure so confusion at baseline.  Upon evaluation in the emergency department was noted to have an elevated white count and CT and MRI scan of shown the presence of discitis osteomyelitis.  Past Medical History:  Diagnosis Date   Allergy    ANEMIA ASSOCIATED W/OTHER [facility] NUTRITIONAL DEFIC 08/11/2010   Qualifier: Diagnosis of  By: Ronnald Ramp MD, [name].    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    History of skin cancer    Hyperlipidemia with target LDL less than 100 11/10/2008        Hypertension    Memory loss 05/14/2009   Obesity (BMI 30.0-34.9) 04/01/2013   Osteoarthritis    PSA elevation 10/25/2018   SKIN CANCER, HX OF 11/10/2008   Qualifier: Diagnosis of  By: Ronnald Ramp MD, [name].    Snoring 04/03/2014   Type II diabetes mellitus with manifestations ([place]) 11/10/2008   Estimated Creatinine Clearance: 38 mL/min (A) (by C-G formula based on SCr of 1.52 mg/dL (H)).    Venous stasis dermatitis of both lower extremities 01/29/2018   Vitamin D deficiency 11/10/2008    Past Surgical History:  Procedure Laterality Date   CHOLECYSTECTOMY     INGUINAL HERNIA REPAIR     x 3   JOINT REPLACEMENT     KNEE SURGERY     x 2   MELANOMA EXCISION     x 3 -- Left arm   TOTAL KNEE ARTHROPLASTY     x 3    Family History  Problem Relation Age  of Onset   Heart disease Mother    Diabetes Mother    Heart disease Father    Cancer Sister    Cancer Brother    Diabetes Son    Diabetes Other     Social History:  reports that he has never smoked. He has never used smokeless tobacco. He reports that he does not drink alcohol and does not use drugs.  Allergies:  Allergies  Allergen Reactions   Ace Inhibitors     REACTION: cough   Oxycodone-Acetaminophen    Penicillins     Medications: I have reviewed the patient's current medications.  Results for orders placed or performed during the hospital encounter of 10/05/22 (from the past 48 hour(s))  CBC with Differential     Status: Abnormal   Collection Time: 10/05/22  8:22 PM  Result Value Ref Range   WBC 13.8 (H) 4.0 - 10.5 K/uL   RBC 4.43 4.22 - 5.81 MIL/uL   Hemoglobin 12.9 (L) 13.0 - 17.0 g/dL   HCT 39.1 39.0 - 52.0 %   MCV 88.3 80.0 - 100.0 fL   MCH 29.1 26.0 - 34.0 pg   MCHC 33.0 30.0 - 36.0 g/dL   RDW 14.9 11.5 - 15.5 %  Platelets 201 150 - 400 K/uL   nRBC 0.0 0.0 - 0.2 %   Neutrophils Relative % 61 %   Neutro Abs 8.4 (H) 1.7 - 7.7 K/uL   Lymphocytes Relative 24 %   Lymphs Abs 3.3 0.7 - 4.0 K/uL   Monocytes Relative 14 %   Monocytes Absolute 1.9 (H) 0.1 - 1.0 K/uL   Eosinophils Relative 1 %   Eosinophils Absolute 0.2 0.0 - 0.5 K/uL   Basophils Relative 0 %   Basophils Absolute 0.0 0.0 - 0.1 K/uL   Immature Granulocytes 0 %   Abs Immature Granulocytes 0.05 0.00 - 0.07 K/uL    Comment: Performed at [facility] [address], [place], [place] [address]  Comprehensive metabolic panel     Status: Abnormal   Collection Time: 10/05/22  8:22 PM  Result Value Ref Range   Sodium 136 135 - 145 mmol/L   Potassium 3.3 (L) 3.5 - 5.1 mmol/L   Chloride 104 98 - 111 mmol/L   CO2 22 22 - 32 mmol/L   Glucose, Bld 215 (H) 70 - 99 mg/dL    Comment: Glucose reference range applies only to samples taken after fasting for at least 8 hours.   BUN 15 8 - 23 mg/dL    Creatinine, Ser 1.35 (H) 0.61 - 1.24 mg/dL   Calcium 8.8 (L) 8.9 - 10.3 mg/dL   Total Protein 5.9 (L) 6.5 - 8.1 g/dL   Albumin 3.3 (L) 3.5 - 5.0 g/dL   AST 24 15 - 41 U/L   ALT 21 0 - 44 U/L   Alkaline Phosphatase 135 (H) 38 - 126 U/L   Total Bilirubin 0.8 0.3 - 1.2 mg/dL   GFR, Estimated 49 (L) >60 mL/min    Comment: (NOTE) Calculated using the CKD-EPI Creatinine Equation (2021)    Anion gap 10 5 - 15    Comment: Performed at [facility], [place] [address] [place], [place] [address]  Lipase, blood     Status: None   Collection Time: 10/05/22  8:22 PM  Result Value Ref Range   Lipase 30 11 - 51 U/L    Comment: Performed at [facility], [place] [address], [place], [place] [address]  Ammonia     Status: None   Collection Time: 10/05/22  8:22 PM  Result Value Ref Range   Ammonia <10 9 - 35 umol/L    Comment: Performed at [facility], [place] [address], [place], [place] [address]  Ethanol     Status: Abnormal   Collection Time: 10/05/22  8:22 PM  Result Value Ref Range   Alcohol, Ethyl (B) 11 (H) <10 mg/dL    Comment: (NOTE) Lowest detectable limit for serum alcohol is 10 mg/dL.  For medical purposes only. Performed at [facility], [place] [address] [place], [place] [address]   Magnesium     Status: None   Collection Time: 10/05/22  8:22 PM  Result Value Ref Range   Magnesium 2.2 1.7 - 2.4 mg/dL    Comment: Performed at [facility], [place] [address], [place], [place] [address]  I-Stat venous blood gas, ED     Status: Abnormal   Collection Time: 10/05/22  8:32 PM  Result Value Ref Range   pH, Ven 7.375 7.25 - 7.43   pCO2, Ven 39.6 (L) 44 - 60 mmHg   pO2, Ven 27 (LL) 32 - 45 mmHg   Bicarbonate 23.1 20.0 - 28.0  mmol/L   TCO2 24 22 - 32 mmol/L   O2 Saturation 48 %   Acid-base deficit 2.0 0.0 - 2.0 mmol/L   Sodium 138 135 - 145 mmol/L   Potassium 3.3 (L) 3.5 - 5.1 mmol/L   Calcium, Ion 1.19 1.15 - 1.40 mmol/L   HCT 38.0 (L) 39.0 - 52.0 %    Hemoglobin 12.9 (L) 13.0 - 17.0 g/dL   Sample type VENOUS    Comment NOTIFIED PHYSICIAN   Urinalysis, Routine w reflex microscopic -Urine, Clean Catch     Status: Abnormal   Collection Time: 10/05/22  9:25 PM  Result Value Ref Range   Color, Urine YELLOW YELLOW   APPearance HAZY (A) CLEAR   Specific Gravity, Urine 1.010 1.005 - 1.030   pH 5.0 5.0 - 8.0   Glucose, UA 50 (A) NEGATIVE mg/dL   Hgb urine dipstick NEGATIVE NEGATIVE   Bilirubin Urine NEGATIVE NEGATIVE   Ketones, ur NEGATIVE NEGATIVE mg/dL   Protein, ur NEGATIVE NEGATIVE mg/dL   Nitrite NEGATIVE NEGATIVE   Leukocytes,Ua NEGATIVE NEGATIVE   RBC / HPF 0-5 0 - 5 RBC/hpf   WBC, UA 0-5 0 - 5 WBC/hpf   Bacteria, UA RARE (A) NONE SEEN   Squamous Epithelial / HPF 0-5 0 - 5 /HPF   Mucus PRESENT     Comment: Performed at [facility], [address], [place], [place] [address]  Resp panel by RT-PCR (RSV, Flu A&B, Covid) Anterior Nasal Swab     Status: None   Collection Time: 10/05/22 10:18 PM   Specimen: Anterior Nasal Swab  Result Value Ref Range   SARS Coronavirus 2 by RT PCR NEGATIVE NEGATIVE   Influenza A by PCR NEGATIVE NEGATIVE   Influenza B by PCR NEGATIVE NEGATIVE    Comment: (NOTE) The Xpert Xpress SARS-CoV-2/FLU/RSV plus assay is intended as an aid in the diagnosis of influenza from Nasopharyngeal swab specimens and should not be used as a sole basis for treatment. Nasal washings and aspirates are unacceptable for Xpert Xpress SARS-CoV-2/FLU/RSV testing.  Fact Sheet for Patients: EntrepreneurPulse.com.au  Fact Sheet for Healthcare Providers: IncredibleEmployment.be  This test is not yet approved or cleared by the [place] FDA and has been authorized for detection and/or diagnosis of SARS-CoV-2 by FDA under an Emergency Use Authorization (EUA). This EUA will remain in effect (meaning this test can be used) for the duration of the COVID-19 declaration under Section  564(b)(1) of the Act, 21 U.S.C. section 360bbb-3(b)(1), unless the authorization is terminated or revoked.     Resp Syncytial Virus by PCR NEGATIVE NEGATIVE    Comment: (NOTE) Fact Sheet for Patients: EntrepreneurPulse.com.au  Fact Sheet for Healthcare Providers: IncredibleEmployment.be  This test is not yet approved or cleared by the [place] FDA and has been authorized for detection and/or diagnosis of SARS-CoV-2 by FDA under an Emergency Use Authorization (EUA). This EUA will remain in effect (meaning this test can be used) for the duration of the COVID-19 declaration under Section 564(b)(1) of the Act, 21 U.S.C. section 360bbb-3(b)(1), unless the authorization is terminated or revoked.  Performed at [facility], [place] [address], [place], [place] [address]   Type and screen [place]     Status: None   Collection Time: 10/06/22 12:15 AM  Result Value Ref Range   ABO/RH(D) O POS    Antibody Screen NEG    Sample Expiration      10/09/2022,2359 Performed at [facility], [place]  [address] [place], [place] [address]   Procalcitonin - Baseline     Status: None   Collection Time: 10/06/22  2:04 AM  Result Value Ref Range   Procalcitonin <0.10 ng/mL    Comment:        Interpretation: PCT (Procalcitonin) <= 0.5 ng/mL: Systemic infection (sepsis) is not likely. Local bacterial infection is possible. (NOTE)       Sepsis PCT Algorithm           Lower Respiratory Tract                                      Infection PCT Algorithm    ----------------------------     ----------------------------         PCT < 0.25 ng/mL                PCT < 0.10 ng/mL          Strongly encourage             Strongly discourage   discontinuation of antibiotics    initiation of antibiotics    ----------------------------     -----------------------------       PCT 0.25 - 0.50 ng/mL            PCT 0.10 - 0.25 ng/mL                OR       >80% decrease in PCT            Discourage initiation of                                            antibiotics      Encourage discontinuation           of antibiotics    ----------------------------     -----------------------------         PCT >= 0.50 ng/mL              PCT 0.26 - 0.50 ng/mL               AND        <80% decrease in PCT             Encourage initiation of                                             antibiotics       Encourage continuation           of antibiotics    ----------------------------     -----------------------------        PCT >= 0.50 ng/mL                  PCT > 0.50 ng/mL               AND         increase in PCT                  Strongly encourage  initiation of antibiotics    Strongly encourage escalation           of antibiotics                                     -----------------------------                                           PCT <= 0.25 ng/mL                                                 OR                                        > 80% decrease in PCT                                      Discontinue / Do not initiate                                             antibiotics  Performed at [facility], [address], [place], [place] [address]   Blood gas, venous (at [facility] and [facility])     Status: Abnormal   Collection Time: 10/06/22  2:16 AM  Result Value Ref Range   pH, Ven 7.43 7.25 - 7.43   pCO2, Ven 37 (L) 44 - 60 mmHg   pO2, Ven 41 32 - 45 mmHg   Bicarbonate 24.6 20.0 - 28.0 mmol/L   Acid-Base Excess 0.5 0.0 - 2.0 mmol/L   O2 Saturation 75.4 %   Patient temperature 37.1    Collection site VEIN    Drawn by [phone]     Comment: Performed at [facility] [address], [place], [place] [address]  CBC with Differential/Platelet     Status: Abnormal   Collection Time: 10/06/22  2:16 AM  Result Value Ref Range   WBC 12.2 (H) 4.0 - 10.5 K/uL   RBC 4.25 4.22 - 5.81 MIL/uL    Hemoglobin 12.5 (L) 13.0 - 17.0 g/dL   HCT 36.6 (L) 39.0 - 52.0 %   MCV 86.1 80.0 - 100.0 fL   MCH 29.4 26.0 - 34.0 pg   MCHC 34.2 30.0 - 36.0 g/dL   RDW 14.9 11.5 - 15.5 %   Platelets 182 150 - 400 K/uL   nRBC 0.0 0.0 - 0.2 %   Neutrophils Relative % 63 %   Neutro Abs 7.7 1.7 - 7.7 K/uL   Lymphocytes Relative 22 %   Lymphs Abs 2.7 0.7 - 4.0 K/uL   Monocytes Relative 14 %   Monocytes Absolute 1.7 (H) 0.1 - 1.0 K/uL   Eosinophils Relative 1 %   Eosinophils Absolute 0.1 0.0 - 0.5 K/uL   Basophils Relative 0 %   Basophils Absolute 0.0 0.0 - 0.1 K/uL   Immature Granulocytes 0 %  Abs Immature Granulocytes 0.04 0.00 - 0.07 K/uL    Comment: Performed at [facility], [place] [address], [place], [place] [address]  Comprehensive metabolic panel     Status: Abnormal   Collection Time: 10/06/22  2:16 AM  Result Value Ref Range   Sodium 137 135 - 145 mmol/L   Potassium 3.3 (L) 3.5 - 5.1 mmol/L   Chloride 104 98 - 111 mmol/L   CO2 22 22 - 32 mmol/L   Glucose, Bld 187 (H) 70 - 99 mg/dL    Comment: Glucose reference range applies only to samples taken after fasting for at least 8 hours.   BUN 13 8 - 23 mg/dL   Creatinine, Ser 1.22 0.61 - 1.24 mg/dL   Calcium 8.7 (L) 8.9 - 10.3 mg/dL   Total Protein 5.4 (L) 6.5 - 8.1 g/dL   Albumin 3.0 (L) 3.5 - 5.0 g/dL   AST 23 15 - 41 U/L   ALT 19 0 - 44 U/L   Alkaline Phosphatase 130 (H) 38 - 126 U/L   Total Bilirubin 0.8 0.3 - 1.2 mg/dL   GFR, Estimated 56 (L) >60 mL/min    Comment: (NOTE) Calculated using the CKD-EPI Creatinine Equation (2021)    Anion gap 11 5 - 15    Comment: Performed at [facility], [place] [address] [place], [place] [address]  Magnesium     Status: None   Collection Time: 10/06/22  2:16 AM  Result Value Ref Range   Magnesium 2.0 1.7 - 2.4 mg/dL    Comment: Performed at [facility] [address], [place], [place] [address]  Protime-INR     Status: None   Collection Time: 10/06/22  2:16 AM  Result Value  Ref Range   Prothrombin Time 14.0 11.4 - 15.2 seconds   INR 1.1 0.8 - 1.2    Comment: (NOTE) INR goal varies based on device and disease states. Performed at [facility], [place] [address], [place], [place] [address]   TSH     Status: None   Collection Time: 10/06/22  2:16 AM  Result Value Ref Range   TSH 3.821 0.350 - 4.500 uIU/mL    Comment: Performed by a 3rd Generation assay with a functional sensitivity of <=0.01 uIU/mL. Performed at [facility], [place] [address] [place], [place] [address]     MR Lumbar Spine W Wo Contrast  Result Date: 10/06/2022 CLINICAL DATA:  Initial evaluation for low back pain, infection. EXAM: MRI LUMBAR SPINE WITHOUT AND WITH CONTRAST TECHNIQUE: Multiplanar and multiecho pulse sequences of the lumbar spine were obtained without and with intravenous contrast. CONTRAST:  7.22m GADAVIST GADOBUTROL 1 MMOL/ML IV SOLN COMPARISON:  CT from earlier the same day. FINDINGS: Segmentation:  Examination degraded by motion artifact. Standard segmentation. Lowest well-formed disc space labeled the L5-S1 level. Alignment:  Moderate levoscoliosis.  No significant listhesis. Vertebrae: Abnormal T1 hypointensity, with heterogeneous STIR hyperintensity and enhancement seen within the L3 vertebral body. Edema and enhancement noted within the adjacent L3-4 interspace as well as about the superior endplate of L4. Findings are nonspecific, but most concerning for possible osteomyelitis discitis. Surrounding paraspinous edema and inflammatory changes. No soft tissue collections. Abnormal enhancing material within the ventral epidural space at the level of L3 measures approximately 2.9 x 1.4 cm (series 8, image 19). Findings concerning for epidural phlegmon. 1.6 cm nonenhancing material at the level could reflect a small epidural abscess versus disc protrusion (series 8, image  17). Ventral epidural enhancement extends from the L1-2 through L4-5 interspace. While these findings are  favored to be infectious/inflammatory in nature, a possible underlying neoplastic process involving the L3 vertebral body remains difficult to exclude. No other convincing evidence for acute infection elsewhere within the lumbar spine. Reactive endplate changes about the L1-2, L2-3, and L5-S1 interspaces favored to be degenerative. Mild edema and enhancement about the L2-3 and L3-4 facets also favored to be degenerative. Underlying bone marrow signal intensity diffusely heterogeneous. No other worrisome osseous lesions. Conus medullaris and cauda equina: Conus extends to the T12 level. Conus and cauda equina appear normal. Paraspinal and other soft tissues: Paraspinous edema and enhancement adjacent to the L3-4 interspace as above. No collections. Partially visualized urinary bladder is markedly distended with associated bladder wall trabeculation and/or diverticula. Mild bilateral hydroureteronephrosis, suspected to be related to bladder distension. Disc levels: L1-2: Advanced degenerative intervertebral disc space narrowing with diffuse disc bulge and disc desiccation. Reactive endplate spurring. Moderate right worse than left facet and ligament flavum hypertrophy. No significant spinal stenosis. Foramina remain patent. L2-3: Moderate degenerative intervertebral disc space narrowing with diffuse disc bulge, asymmetric to the left. Associated reactive endplate spurring. 1.6 cm nonenhancing material within the right ventral epidural space could reflect a right subarticular disc protrusion or possibly epidural abscess (series 8, image 17). Moderate facet and ligament flavum hypertrophy. Resultant mild canal with moderate left lateral recess stenosis. Moderate left L2 foraminal narrowing. Right neural foramen remains patent. L3-4: Advanced intervertebral disc space narrowing with findings concerning for osteomyelitis discitis as above. Probable epidural phlegmon within the ventral epidural space at the level of L3  (series 8, image 19). Moderate left worse than right facet arthrosis. Resultant severe spinal stenosis. Moderate to severe left with mild right L3 foraminal narrowing. L4-5: Degenerative intervertebral disc space narrowing with disc desiccation and diffuse disc bulge. Left-sided reactive endplate spurring. Moderate facet hypertrophy. Resultant mild narrowing of the left lateral recess. Central canal remains patent. Mild to moderate left L4 foraminal narrowing. Right neural foramen remains patent. L5-S1: Degenerative intervertebral disc space narrowing with disc desiccation and diffuse disc bulge. Reactive endplate spurring. Moderate bilateral facet arthrosis. No significant spinal stenosis. Mild bilateral L5 foraminal narrowing. IMPRESSION: 1. Findings concerning for osteomyelitis discitis at the L3-4 level. Abnormal enhancing material within the ventral epidural space at the level of L3, concerning for epidural phlegmon. These changes superimposed on underlying spondylosis and facet arthrosis resultant severe spinal stenosis. While these changes are favored to reflect infection, a possible neoplastic process involving the L3 vertebral body remains difficult to exclude. Kaluzny interval follow-up imaging may be helpful for further evaluation as warranted. 2. Surrounding paraspinous edema and enhancement, but with no drainable soft tissue collection collections. 3. Underlying advanced multilevel degenerative spondylosis and facet arthrosis as detailed above. No other high-grade spinal stenosis 4. Markedly distended urinary bladder with associated bladder wall trabeculation and/or diverticula. Clinical correlation for chronic outlet obstruction recommended. Mild bilateral hydroureteronephrosis, suspected to be related to bladder distension. Electronically Signed   By: Jeannine Boga M.D.   On: 10/06/2022 00:47   CT Lumbar Spine Wo Contrast  Result Date: 10/05/2022 CLINICAL DATA:  Low back pain.  Increased  fracture risk. EXAM: CT LUMBAR SPINE WITHOUT CONTRAST TECHNIQUE: Multidetector CT imaging of the lumbar spine was performed without intravenous contrast administration. Multiplanar CT image reconstructions were also generated. RADIATION DOSE REDUCTION: This exam was performed according to the departmental dose-optimization program which includes automated exposure control, adjustment of the mA and/or kV  according to patient size and/or use of iterative reconstruction technique. COMPARISON:  Radiography 07/26/2022 FINDINGS: Segmentation: 5 lumbar type vertebral bodies. Alignment: Chronic mild S shaped scoliotic curvature. Vertebrae: Chronic degenerative endplate changes at the L1-2 level. Newly seen lytic destructive pattern throughout the L3 vertebral body, worse towards the left side. Question some involvement of the L2 vertebral body and superior aspect of the L4 vertebral body. Lucent focus within the central portion of the S1 vertebral body. These findings could be due to metastatic disease, infection or both. See below. Paraspinal and other soft tissues: Distended bladder. Aortic atherosclerosis. Pronounced edematous change in the soft tissues surrounding the L3 vertebral body, worrisome in particular for infection but possibly consistent with tumor as well. Disc levels: T9-10 through T12-L1: Negative L1-2: Chronic disc degeneration with loss of disc height. Endplate osteophytes and mild bulging of the disc. No compressive stenosis. L2-3: Disc space narrowing and vacuum phenomenon. Circumferential bulging of the disc. Facet and ligamentous hypertrophy. Multifactorial stenosis at this level that could cause neural compression. As noted above, there may be some lytic changes of the inferior L2 vertebral body. This raises the possibility of discitis osteomyelitis involving this level. L3-4: Lytic destructive appearance of the L3 vertebral body with loss of height more on the left. Edematous change of the  paravertebral soft tissues. Bulging of the disc. Facet and ligamentous hypertrophy. Severe stenosis at this level. Epidural abscess not excluded. As noted above, there could be lesser destructive change of superior L4. Certainly the findings at this level or worrisome for discitis osteomyelitis. L4-5: Chronic disc degeneration with loss of disc height. Bulging of the disc. Facet and ligamentous hypertrophy. Moderate degenerative type stenosis. L5-S1: Chronic disc degeneration with loss of disc height. Endplate osteophytes and bulging of the disc. Facet degeneration. No canal stenosis. Mild bilateral foraminal stenosis. Lucent area within the S1 vertebral body which is indeterminate. IMPRESSION: 1. Lytic destructive pattern throughout the L3 vertebral body with loss of height more on the left. Question some involvement of the L2 vertebral body and superior aspect of the L4 vertebral body. Pronounced edematous change in the paravertebral soft tissues surrounding the L3 vertebral body, worrisome for infection but possibly consistent with tumor as well. 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 Severe stenosis at the L2-3 and L3-4 levels, possibly resulting in neurogenic bladder. Epidural abscess is not excluded. Is the patient an MR candidate? 2. Lucent area within the S1 vertebral body which is indeterminate. 3. Chronic degenerative changes at L1-2, L4-5 and L5-S1 as above. Aortic Atherosclerosis (ICD10-I70.0). Electronically Signed   By: Nelson Chimes M.D.   On: 10/05/2022 21:20   DG Chest 1 View  Result Date: 10/05/2022 CLINICAL DATA:  Altered mental status EXAM: CHEST  1 VIEW COMPARISON:  06/02/2021 FINDINGS: Heart is normal size. Mediastinal contours within normal limits. Bibasilar atelectasis. No effusions or pneumothorax. No acute bony abnormality. IMPRESSION: Bibasilar atelectasis. Electronically Signed   By: Rolm Baptise M.D.   On: 10/05/2022 21:13   DG Pelvis 1-2  Views  Result Date: 10/05/2022 CLINICAL DATA:  Left hip and pelvic pain EXAM: PELVIS - 1-2 VIEW COMPARISON:  None Available. FINDINGS: Mild symmetric degenerative changes in the hips with joint space narrowing and spurring. No acute bony abnormality. Specifically, no fracture, subluxation, or dislocation. Degenerative changes in the visualized lower lumbar spine. IMPRESSION: No acute bony abnormality. Electronically Signed   By: Rolm Baptise M.D.   On: 10/05/2022 21:13   CT Head Wo Contrast  Result Date: 10/05/2022 CLINICAL DATA:  Delirium.  Low back pain. EXAM: CT HEAD WITHOUT CONTRAST TECHNIQUE: Contiguous axial images were obtained from the base of the skull through the vertex without intravenous contrast. RADIATION DOSE REDUCTION: This exam was performed according to the departmental dose-optimization program which includes automated exposure control, adjustment of the mA and/or kV according to patient size and/or use of iterative reconstruction technique. COMPARISON:  MRI 02/28/2004 FINDINGS: Brain: Generalized brain volume loss. Moderate chronic small-vessel ischemic changes of the white matter. No sign of acute infarction, mass lesion, hemorrhage, hydrocephalus or extra-axial collection. Vascular: There is atherosclerotic calcification of the major vessels at the base of the brain. Skull: Negative Sinuses/Orbits: Opacified right maxillary sinus.  Orbits negative. Other: None IMPRESSION: 1. No acute CT finding. Atrophy and chronic small-vessel ischemic changes of the white matter. 2. Opacified right maxillary sinus. Electronically Signed   By: Nelson Chimes M.D.   On: 10/05/2022 21:11    Review of Systems  Unable to perform ROS: Mental status change   Blood pressure (!) 159/75, pulse 99, temperature 98 F (36.7 C), temperature source Oral, resp. rate 18, SpO2 99 %. Physical Exam Neurological:     Comments: Very difficult exam as the patient is virtually deaf.  Does appear to have well over  antigravity strength in his lower extremities when prompted but again patient noncompliant as difficult to communicate without his hearing aids.  Decreased reflexes bilaterally     Assessment/Plan: 89 year old gentleman with confusion and altered mental status multiple medical comorbidities possible encephalopathy with CT and MRI evidence of discitis osteomyelitis and a component of epidural abscess.  Patient very difficult to communicate with and get an exam on secondary to his deafness however patient seems to have well over antigravity in his lower extremities and at 89 years of age with his multiple medical comorbidities I certainly am reluctant to recommend any surgery at this time discitis osteomyelitis should continue with medical workup to include blood cultures urine cultures laboratory markers and see if we can identify bacteria.  Would certainly consult infectious disease and start the patient on empiric antibiotics once cultures are obtained consideration certainly could be given to a CT aspiration although there is no frank fluid collection to target with that.  At this point I think surgery is a high risk for him and consider maximizing medical management.  Elaina Hoops 10/06/2022, 8:03 AM

## 2022-10-06 NOTE — Progress Notes (Signed)
PROGRESS NOTE    Nicholas Burton  JSE:[id number] DOB: Sep 12, 1927 DOA: 10/05/2022 PCP: [name], MD    Brief Narrative:  Nicholas Burton is a 89 y.o. male with medical history significant for dementia, type 2 diabetes mellitus, chronic diastolic heart failure, essential hypertension, hyperlipidemia, who is admitted to [facility] on 10/05/2022 with acute encephalopathy after presenting from home to [facility] ED for evaluation of confusion.   He has recently, over the last 2 weeks, been complaining of new onset low back pain as well as new onset left lower extremity weakness, without any reported sensory changes, including no report of associated saddle anesthesia. No report of any urinary changes, including no report of acute urinary retention nor any report of fecal incontinence. No report of preceding trauma.   Assessment and Plan:  acute encephalopathy:  -1 to 2 days of confusion, with underlying etiology not entirely clear at this time.  Differential includes potential infectious process in the context of possible discitis versus osteomyelitis in involving the lumbar spine, as further detailed below. -delirium precautions       Lytic destructive pattern of the lumbar vertebral bodies involving L2-L4: As further detailed above, with underlying etiology not entirely clear, but with current differential including osteomyelitis versus discitis versus pathological fracture from malignancy.  -seen by NS: reluctant to recommend any surgery at this time discitis osteomyelitis should continue with medical workup to include blood cultures urine cultures laboratory markers and see if we can identify bacteria. Would certainly consult infectious disease and start the patient on empiric antibiotics once cultures are obtained consideration certainly could be given to a CT aspiration although there is no frank fluid collection to target with that. At this point I think surgery is a high risk for him and  consider maximizing medical management.  -ID consulted- recommend biopsy-- has not been on abx yet -palliative care consult for GOC-- would like to pursue biopsy  -IR consult for biopsy      Hypo-kalemia: Presenting to potassium level mildly low at 3.3. -replete     Type 2 Diabetes Mellitus:  - Home insulin regimen: Lantus 30 units subcu nightly. Home oral hypoglycemic agents: Metformin, apical fluids and. presenting blood sugar: 215. Most recent A1c noted to be 8.0 when checked in November 2023.  -SSI, semglee    Chronic diastolic heart failure:  -documented history of such, with most recent echocardiogram performed in August 2020, which was notable for LVEF 60 to 65%, left ventricular diastolic parameters consistent with impaired relaxation, normal right ventricular systolic function.. No clinical or radiographic evidence to suggest acutely decompensated heart failure at this time      Essential Hypertension: -resume home meds as needed      Hyperlipidemia  -resume home med upon d/c   ? Alcohol use -monitor for withdrawal    PT/OT eval   DVT prophylaxis: SCDs Start: 10/05/22 2322    Code Status: Full Code Family Communication: called son  Disposition Plan:  Level of care: Telemetry Medical Status is: Inpatient Remains inpatient appropriate because: needs procedure    Consultants:  IR ID NS Palliative care   Subjective: Would like to get out of bed  Objective: Vitals:   10/06/22 0347 10/06/22 0505 10/06/22 0730 10/06/22 0825  BP:  (!) 149/75 (!) 159/75 136/83  Pulse: 98 95 99 98  Resp: (!) '21 20 18 20  '$ Temp:   98 F (36.7 C) 98 F (36.7 C)  TempSrc:   Oral Oral  SpO2: 100% 98% 99% 100%   No intake or output data in the 24 hours ending 10/06/22 1045 There were no vitals filed for this visit.  Examination:   General: Appearance:     Overweight male in no acute distress- VERY HARD OF HEARING     Lungs:     respirations unlabored  Heart:     Normal heart rate.    MS:   All extremities are intact.    Neurologic:   Awake, alert,-- oriented to self only       Data Reviewed: I have personally reviewed following labs and imaging studies  CBC: Recent Labs  Lab 10/05/22 2022 10/05/22 2032 10/06/22 0216  WBC 13.8*  --  12.2*  NEUTROABS 8.4*  --  7.7  HGB 12.9* 12.9* 12.5*  HCT 39.1 38.0* 36.6*  MCV 88.3  --  86.1  PLT 201  --  960   Basic Metabolic Panel: Recent Labs  Lab 10/05/22 2022 10/05/22 2032 10/06/22 0216  NA 136 138 137  K 3.3* 3.3* 3.3*  CL 104  --  104  CO2 22  --  22  GLUCOSE 215*  --  187*  BUN 15  --  13  CREATININE 1.35*  --  1.22  CALCIUM 8.8*  --  8.7*  MG 2.2  --  2.0   GFR: CrCl cannot be calculated (Unknown ideal weight.). Liver Function Tests: Recent Labs  Lab 10/05/22 2022 10/06/22 0216  AST 24 23  ALT 21 19  ALKPHOS 135* 130*  BILITOT 0.8 0.8  PROT 5.9* 5.4*  ALBUMIN 3.3* 3.0*   Recent Labs  Lab 10/05/22 2022  LIPASE 30   Recent Labs  Lab 10/05/22 2022  AMMONIA <10   Coagulation Profile: Recent Labs  Lab 10/06/22 0216  INR 1.1   Cardiac Enzymes: No results for input(s): "CKTOTAL", "CKMB", "CKMBINDEX", "TROPONINI" in the last 168 hours. BNP (last 3 results) Recent Labs    07/26/22 1406  PROBNP 59.0   HbA1C: No results for input(s): "HGBA1C" in the last 72 hours. CBG: No results for input(s): "GLUCAP" in the last 168 hours. Lipid Profile: No results for input(s): "CHOL", "HDL", "LDLCALC", "TRIG", "CHOLHDL", "LDLDIRECT" in the last 72 hours. Thyroid Function Tests: Recent Labs    10/06/22 0216  TSH 3.821   Anemia Panel: No results for input(s): "VITAMINB12", "FOLATE", "FERRITIN", "TIBC", "IRON", "RETICCTPCT" in the last 72 hours. Sepsis Labs: Recent Labs  Lab 10/06/22 0204  PROCALCITON <0.10    Recent Results (from the past 240 hour(s))  Resp panel by RT-PCR (RSV, Flu A&B, Covid) Anterior Nasal Swab     Status: None   Collection Time:  10/05/22 10:18 PM   Specimen: Anterior Nasal Swab  Result Value Ref Range Status   SARS Coronavirus 2 by RT PCR NEGATIVE NEGATIVE Final   Influenza A by PCR NEGATIVE NEGATIVE Final   Influenza B by PCR NEGATIVE NEGATIVE Final    Comment: (NOTE) The Xpert Xpress SARS-CoV-2/FLU/RSV plus assay is intended as an aid in the diagnosis of influenza from Nasopharyngeal swab specimens and should not be used as a sole basis for treatment. Nasal washings and aspirates are unacceptable for Xpert Xpress SARS-CoV-2/FLU/RSV testing.  Fact Sheet for Patients: EntrepreneurPulse.com.au  Fact Sheet for Healthcare Providers: IncredibleEmployment.be  This test is not yet approved or cleared by the [place] FDA and has been authorized for detection and/or diagnosis of SARS-CoV-2 by FDA under an Emergency Use Authorization (EUA). This EUA will remain in effect (  meaning this test can be used) for the duration of the COVID-19 declaration under Section 564(b)(1) of the Act, 21 U.S.C. section 360bbb-3(b)(1), unless the authorization is terminated or revoked.     Resp Syncytial Virus by PCR NEGATIVE NEGATIVE Final    Comment: (NOTE) Fact Sheet for Patients: EntrepreneurPulse.com.au  Fact Sheet for Healthcare Providers: IncredibleEmployment.be  This test is not yet approved or cleared by the [place] FDA and has been authorized for detection and/or diagnosis of SARS-CoV-2 by FDA under an Emergency Use Authorization (EUA). This EUA will remain in effect (meaning this test can be used) for the duration of the COVID-19 declaration under Section 564(b)(1) of the Act, 21 U.S.C. section 360bbb-3(b)(1), unless the authorization is terminated or revoked.  Performed at [facility], [place] [address] [place], [place] [address]          Radiology Studies: MR Lumbar Spine W Wo Contrast  Result Date: 10/06/2022 CLINICAL  DATA:  Initial evaluation for low back pain, infection. EXAM: MRI LUMBAR SPINE WITHOUT AND WITH CONTRAST TECHNIQUE: Multiplanar and multiecho pulse sequences of the lumbar spine were obtained without and with intravenous contrast. CONTRAST:  7.22m GADAVIST GADOBUTROL 1 MMOL/ML IV SOLN COMPARISON:  CT from earlier the same day. FINDINGS: Segmentation:  Examination degraded by motion artifact. Standard segmentation. Lowest well-formed disc space labeled the L5-S1 level. Alignment:  Moderate levoscoliosis.  No significant listhesis. Vertebrae: Abnormal T1 hypointensity, with heterogeneous STIR hyperintensity and enhancement seen within the L3 vertebral body. Edema and enhancement noted within the adjacent L3-4 interspace as well as about the superior endplate of L4. Findings are nonspecific, but most concerning for possible osteomyelitis discitis. Surrounding paraspinous edema and inflammatory changes. No soft tissue collections. Abnormal enhancing material within the ventral epidural space at the level of L3 measures approximately 2.9 x 1.4 cm (series 8, image 19). Findings concerning for epidural phlegmon. 1.6 cm nonenhancing material at the level could reflect a small epidural abscess versus disc protrusion (series 8, image 17). Ventral epidural enhancement extends from the L1-2 through L4-5 interspace. While these findings are favored to be infectious/inflammatory in nature, a possible underlying neoplastic process involving the L3 vertebral body remains difficult to exclude. No other convincing evidence for acute infection elsewhere within the lumbar spine. Reactive endplate changes about the L1-2, L2-3, and L5-S1 interspaces favored to be degenerative. Mild edema and enhancement about the L2-3 and L3-4 facets also favored to be degenerative. Underlying bone marrow signal intensity diffusely heterogeneous. No other worrisome osseous lesions. Conus medullaris and cauda equina: Conus extends to the T12 level. Conus  and cauda equina appear normal. Paraspinal and other soft tissues: Paraspinous edema and enhancement adjacent to the L3-4 interspace as above. No collections. Partially visualized urinary bladder is markedly distended with associated bladder wall trabeculation and/or diverticula. Mild bilateral hydroureteronephrosis, suspected to be related to bladder distension. Disc levels: L1-2: Advanced degenerative intervertebral disc space narrowing with diffuse disc bulge and disc desiccation. Reactive endplate spurring. Moderate right worse than left facet and ligament flavum hypertrophy. No significant spinal stenosis. Foramina remain patent. L2-3: Moderate degenerative intervertebral disc space narrowing with diffuse disc bulge, asymmetric to the left. Associated reactive endplate spurring. 1.6 cm nonenhancing material within the right ventral epidural space could reflect a right subarticular disc protrusion or possibly epidural abscess (series 8, image 17). Moderate facet and ligament flavum hypertrophy. Resultant mild canal with moderate left lateral recess stenosis. Moderate left L2 foraminal narrowing. Right neural foramen remains patent. L3-4: Advanced intervertebral disc space  narrowing with findings concerning for osteomyelitis discitis as above. Probable epidural phlegmon within the ventral epidural space at the level of L3 (series 8, image 19). Moderate left worse than right facet arthrosis. Resultant severe spinal stenosis. Moderate to severe left with mild right L3 foraminal narrowing. L4-5: Degenerative intervertebral disc space narrowing with disc desiccation and diffuse disc bulge. Left-sided reactive endplate spurring. Moderate facet hypertrophy. Resultant mild narrowing of the left lateral recess. Central canal remains patent. Mild to moderate left L4 foraminal narrowing. Right neural foramen remains patent. L5-S1: Degenerative intervertebral disc space narrowing with disc desiccation and diffuse disc  bulge. Reactive endplate spurring. Moderate bilateral facet arthrosis. No significant spinal stenosis. Mild bilateral L5 foraminal narrowing. IMPRESSION: 1. Findings concerning for osteomyelitis discitis at the L3-4 level. Abnormal enhancing material within the ventral epidural space at the level of L3, concerning for epidural phlegmon. These changes superimposed on underlying spondylosis and facet arthrosis resultant severe spinal stenosis. While these changes are favored to reflect infection, a possible neoplastic process involving the L3 vertebral body remains difficult to exclude. Perreira interval follow-up imaging may be helpful for further evaluation as warranted. 2. Surrounding paraspinous edema and enhancement, but with no drainable soft tissue collection collections. 3. Underlying advanced multilevel degenerative spondylosis and facet arthrosis as detailed above. No other high-grade spinal stenosis 4. Markedly distended urinary bladder with associated bladder wall trabeculation and/or diverticula. Clinical correlation for chronic outlet obstruction recommended. Mild bilateral hydroureteronephrosis, suspected to be related to bladder distension. Electronically Signed   By: Jeannine Boga M.D.   On: 10/06/2022 00:47   CT Lumbar Spine Wo Contrast  Result Date: 10/05/2022 CLINICAL DATA:  Low back pain.  Increased fracture risk. EXAM: CT LUMBAR SPINE WITHOUT CONTRAST TECHNIQUE: Multidetector CT imaging of the lumbar spine was performed without intravenous contrast administration. Multiplanar CT image reconstructions were also generated. RADIATION DOSE REDUCTION: This exam was performed according to the departmental dose-optimization program which includes automated exposure control, adjustment of the mA and/or kV according to patient size and/or use of iterative reconstruction technique. COMPARISON:  Radiography 07/26/2022 FINDINGS: Segmentation: 5 lumbar type vertebral bodies. Alignment: Chronic mild S  shaped scoliotic curvature. Vertebrae: Chronic degenerative endplate changes at the L1-2 level. Newly seen lytic destructive pattern throughout the L3 vertebral body, worse towards the left side. Question some involvement of the L2 vertebral body and superior aspect of the L4 vertebral body. Lucent focus within the central portion of the S1 vertebral body. These findings could be due to metastatic disease, infection or both. See below. Paraspinal and other soft tissues: Distended bladder. Aortic atherosclerosis. Pronounced edematous change in the soft tissues surrounding the L3 vertebral body, worrisome in particular for infection but possibly consistent with tumor as well. Disc levels: T9-10 through T12-L1: Negative L1-2: Chronic disc degeneration with loss of disc height. Endplate osteophytes and mild bulging of the disc. No compressive stenosis. L2-3: Disc space narrowing and vacuum phenomenon. Circumferential bulging of the disc. Facet and ligamentous hypertrophy. Multifactorial stenosis at this level that could cause neural compression. As noted above, there may be some lytic changes of the inferior L2 vertebral body. This raises the possibility of discitis osteomyelitis involving this level. L3-4: Lytic destructive appearance of the L3 vertebral body with loss of height more on the left. Edematous change of the paravertebral soft tissues. Bulging of the disc. Facet and ligamentous hypertrophy. Severe stenosis at this level. Epidural abscess not excluded. As noted above, there could be lesser destructive change of superior L4. Certainly the findings  at this level or worrisome for discitis osteomyelitis. L4-5: Chronic disc degeneration with loss of disc height. Bulging of the disc. Facet and ligamentous hypertrophy. Moderate degenerative type stenosis. L5-S1: Chronic disc degeneration with loss of disc height. Endplate osteophytes and bulging of the disc. Facet degeneration. No canal stenosis. Mild bilateral  foraminal stenosis. Lucent area within the S1 vertebral body which is indeterminate. IMPRESSION: 1. Lytic destructive pattern throughout the L3 vertebral body with loss of height more on the left. Question some involvement of the L2 vertebral body and superior aspect of the L4 vertebral body. Pronounced edematous change in the paravertebral soft tissues surrounding the L3 vertebral body, worrisome for infection but possibly consistent with tumor as well. 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 Severe stenosis at the L2-3 and L3-4 levels, possibly resulting in neurogenic bladder. Epidural abscess is not excluded. Is the patient an MR candidate? 2. Lucent area within the S1 vertebral body which is indeterminate. 3. Chronic degenerative changes at L1-2, L4-5 and L5-S1 as above. Aortic Atherosclerosis (ICD10-I70.0). Electronically Signed   By: Nelson Chimes M.D.   On: 10/05/2022 21:20   DG Chest 1 View  Result Date: 10/05/2022 CLINICAL DATA:  Altered mental status EXAM: CHEST  1 VIEW COMPARISON:  06/02/2021 FINDINGS: Heart is normal size. Mediastinal contours within normal limits. Bibasilar atelectasis. No effusions or pneumothorax. No acute bony abnormality. IMPRESSION: Bibasilar atelectasis. Electronically Signed   By: Rolm Baptise M.D.   On: 10/05/2022 21:13   DG Pelvis 1-2 Views  Result Date: 10/05/2022 CLINICAL DATA:  Left hip and pelvic pain EXAM: PELVIS - 1-2 VIEW COMPARISON:  None Available. FINDINGS: Mild symmetric degenerative changes in the hips with joint space narrowing and spurring. No acute bony abnormality. Specifically, no fracture, subluxation, or dislocation. Degenerative changes in the visualized lower lumbar spine. IMPRESSION: No acute bony abnormality. Electronically Signed   By: Rolm Baptise M.D.   On: 10/05/2022 21:13   CT Head Wo Contrast  Result Date: 10/05/2022 CLINICAL DATA:  Delirium.  Low back pain. EXAM: CT HEAD WITHOUT CONTRAST TECHNIQUE: Contiguous  axial images were obtained from the base of the skull through the vertex without intravenous contrast. RADIATION DOSE REDUCTION: This exam was performed according to the departmental dose-optimization program which includes automated exposure control, adjustment of the mA and/or kV according to patient size and/or use of iterative reconstruction technique. COMPARISON:  MRI 02/28/2004 FINDINGS: Brain: Generalized brain volume loss. Moderate chronic small-vessel ischemic changes of the white matter. No sign of acute infarction, mass lesion, hemorrhage, hydrocephalus or extra-axial collection. Vascular: There is atherosclerotic calcification of the major vessels at the base of the brain. Skull: Negative Sinuses/Orbits: Opacified right maxillary sinus.  Orbits negative. Other: None IMPRESSION: 1. No acute CT finding. Atrophy and chronic small-vessel ischemic changes of the white matter. 2. Opacified right maxillary sinus. Electronically Signed   By: Nelson Chimes M.D.   On: 10/05/2022 21:11        Scheduled Meds:  insulin aspart  0-9 Units Subcutaneous Q6H   insulin glargine-yfgn  10 Units Subcutaneous QHS   Continuous Infusions:   LOS: 1 day    Time spent: 45 minutes spent on chart review, discussion with nursing staff, consultants, updating family and interview/physical exam; more than 50% of that time was spent in counseling and/or coordination of care.    Geradine Girt, DO [facility] Available via Epic secure chat 7am-7pm After these hours, please refer to coverage provider listed on [email or web address]  10/06/2022, 10:45 AM

## 2022-10-06 NOTE — Consult Note (Signed)
Nicholas Burton for Infectious Disease    Date of Admission:  10/05/2022     Total days of antibiotics 0               Reason for Consult: Osteomyelitis  Referring Provider: Dr. Eulogio Bear Primary Care Provider: [name], MD   ASSESSMENT:  Nicholas Burton is a 89 y/o male admitted with altered mental status and back pain and found to have L3-L4 osteomyelitis with epidural phlegmon. Not a surgical candidate with recommendation for IR aspiration/biopsy. Currently stable and recommend holding antibiotics to optimize any yields obtained from aspiration/biopsy to help guide antibiotic treatment. Palliative Care also following with current full scope of treatment and agreeable to biopsy. Remaining medical and supportive care per Primary Team.  PLAN:  IR aspiration/biopsy for cultures to direct antibiotic therapy. Hold antibiotics until after IR procedure. Palliative Care following with goals of care.  Remaining medical and supportive care per Primary Team.    Principal Problem:   Acute encephalopathy Active Problems:   DM2 (diabetes mellitus, type 2) (HCC)   HLD (hyperlipidemia)   Essential hypertension   Chronic diastolic CHF (congestive heart failure) (HCC)   Lytic lesion of bone on x-ray   Hypokalemia   SIRS (systemic inflammatory response syndrome) (HCC)    acetaminophen  1,000 mg Oral TID   insulin aspart  0-15 Units Subcutaneous TID WC   insulin aspart  0-5 Units Subcutaneous QHS   insulin glargine-yfgn  10 Units Subcutaneous QHS   [START ON 10/07/2022] lidocaine  1 patch Transdermal Q24H   lidocaine  1 patch Transdermal Once     HPI: Nicholas Burton is a 89 y.o. male with previous medical history of dementia, Type 2 diabetes, chronic diastolic heart failure, and hypertension coming from home with altered mental status.  Nicholas Burton was brought to the ED via EMS with increased confusion (baseline A&O x4). Had a cataract procedure earlier in the day and had 2-3 week  history of left low back pain. Head CT with no acute findings. CT lumbar spine with lytic destructive pattern of L3 worrisome for infection or possible tumor with most likely osteomyelitis/discitis and possible epidural abscess. Follow up MRI with L3-L4 discitis/osteomyelitis and epidural phlegmon in the ventral epidural space. Believed to be infectious but neoplastic process cannot be excluded. Chest x-ray was unremarkable. Neurosurgery evaluated with recommendation for antibiotics and possible IR aspiration with high risk for surgery. IR plans for aspiration/biopsy. Has not taken any antibiotics recently. Denies any fevers, chills or sweats.    Review of Systems: Review of Systems  Constitutional:  Negative for chills, fever and weight loss.  Respiratory:  Negative for cough, shortness of breath and wheezing.   Cardiovascular:  Negative for chest pain and leg swelling.  Gastrointestinal:  Negative for abdominal pain, constipation, diarrhea, nausea and vomiting.  Musculoskeletal:  Positive for back pain.  Skin:  Negative for rash.     Past Medical History:  Diagnosis Date   Allergy    ANEMIA ASSOCIATED W/OTHER [facility] NUTRITIONAL DEFIC 08/11/2010   Qualifier: Diagnosis of  By: Ronnald Ramp MD, [name].    Cancer [facility])    skin   Cataract    Chronic combined systolic and diastolic congestive heart failure ([place]) 10/30/2018   COPD with asthma 03/02/2017   CRI (chronic renal insufficiency), stage 3 (moderate) (HCC) 10/25/2018   Deficiency anemia 10/25/2018   Diabetes mellitus    type 2   DOE (dyspnea on exertion) 09/03/2018  Edema 06/11/2009   Qualifier: Diagnosis of  By: Ronnald Ramp MD, [name].    History of skin cancer    Hyperlipidemia with target LDL less than 100 11/10/2008        Hypertension    Memory loss 05/14/2009   Obesity (BMI 30.0-34.9) 04/01/2013   Osteoarthritis    PSA elevation 10/25/2018   SKIN CANCER, HX OF 11/10/2008   Qualifier: Diagnosis of  By: Ronnald Ramp MD, [name].    Snoring 04/03/2014    Type II diabetes mellitus with manifestations ([place]) 11/10/2008   Estimated Creatinine Clearance: 38 mL/min (A) (by C-G formula based on SCr of 1.52 mg/dL (H)).    Venous stasis dermatitis of both lower extremities 01/29/2018   Vitamin D deficiency 11/10/2008    Social History   Tobacco Use   Smoking status: Never   Smokeless tobacco: Never  Vaping Use   Vaping Use: Never used  Substance Use Topics   Alcohol use: No   Drug use: No    Family History  Problem Relation Age of Onset   Heart disease Mother    Diabetes Mother    Heart disease Father    Cancer Sister    Cancer Brother    Diabetes Son    Diabetes Other     Allergies  Allergen Reactions   Ace Inhibitors     REACTION: cough   Oxycodone-Acetaminophen    Penicillins     OBJECTIVE: Blood pressure 109/71, pulse 97, temperature 98.9 F (37.2 C), temperature source Oral, resp. rate 18, SpO2 98 %.  Physical Exam Constitutional:      General: He is not in acute distress.    Appearance: He is well-developed.     Comments: Seated in the bed; pleasant and very hard of hearing.   Cardiovascular:     Rate and Rhythm: Normal rate and regular rhythm.     Heart sounds: Normal heart sounds.  Pulmonary:     Effort: Pulmonary effort is normal.     Breath sounds: Normal breath sounds.  Skin:    General: Skin is warm and dry.  Neurological:     Mental Status: He is alert.     Lab Results Lab Results  Component Value Date   WBC 12.2 (H) 10/06/2022   HGB 12.5 (L) 10/06/2022   HCT 36.6 (L) 10/06/2022   MCV 86.1 10/06/2022   PLT 182 10/06/2022    Lab Results  Component Value Date   CREATININE 1.22 10/06/2022   BUN 13 10/06/2022   NA 137 10/06/2022   K 3.3 (L) 10/06/2022   CL 104 10/06/2022   CO2 22 10/06/2022    Lab Results  Component Value Date   ALT 19 10/06/2022   AST 23 10/06/2022   ALKPHOS 130 (H) 10/06/2022   BILITOT 0.8 10/06/2022     Microbiology: Recent Results (from the past 240 hour(s))   Resp panel by RT-PCR (RSV, Flu A&B, Covid) Anterior Nasal Swab     Status: None   Collection Time: 10/05/22 10:18 PM   Specimen: Anterior Nasal Swab  Result Value Ref Range Status   SARS Coronavirus 2 by RT PCR NEGATIVE NEGATIVE Final   Influenza A by PCR NEGATIVE NEGATIVE Final   Influenza B by PCR NEGATIVE NEGATIVE Final    Comment: (NOTE) The Xpert Xpress SARS-CoV-2/FLU/RSV plus assay is intended as an aid in the diagnosis of influenza from Nasopharyngeal swab specimens and should not be used as a sole basis for treatment. Nasal washings and aspirates  are unacceptable for Xpert Xpress SARS-CoV-2/FLU/RSV testing.  Fact Sheet for Patients: EntrepreneurPulse.com.au  Fact Sheet for Healthcare Providers: IncredibleEmployment.be  This test is not yet approved or cleared by the [place] FDA and has been authorized for detection and/or diagnosis of SARS-CoV-2 by FDA under an Emergency Use Authorization (EUA). This EUA will remain in effect (meaning this test can be used) for the duration of the COVID-19 declaration under Section 564(b)(1) of the Act, 21 U.S.C. section 360bbb-3(b)(1), unless the authorization is terminated or revoked.     Resp Syncytial Virus by PCR NEGATIVE NEGATIVE Final    Comment: (NOTE) Fact Sheet for Patients: EntrepreneurPulse.com.au  Fact Sheet for Healthcare Providers: IncredibleEmployment.be  This test is not yet approved or cleared by the [place] FDA and has been authorized for detection and/or diagnosis of SARS-CoV-2 by FDA under an Emergency Use Authorization (EUA). This EUA will remain in effect (meaning this test can be used) for the duration of the COVID-19 declaration under Section 564(b)(1) of the Act, 21 U.S.C. section 360bbb-3(b)(1), unless the authorization is terminated or revoked.  Performed at [facility], [place] [address] [place],  [place] [address]      Terri Piedra, [place] for Infectious Disease [facility]  10/06/2022  2:00 PM

## 2022-10-06 NOTE — TOC Initial Note (Addendum)
Transition of Care [facility]) - Initial/Assessment Note    Patient Details  Name: Nicholas Burton MRN: [id number] Date of Birth: [birth year]  Transition of Care [facility]) CM/SW Contact:    Sharin Mons, RN Phone Number: 10/06/2022, 11:13 AM  Clinical Narrative:                 Pt presents with acute encephalopathy.  From home with wife, [name]. Supportive family. PTA per wife pt experience confused periods right before eye appointment day of admission to hospital. Wife states pt was very active, independent with ADL's . Used cane, walker with ambulation.  PCP: Scarlette Calico MD   Consults pending : IR, ID ,NS ,Palliative care pending ....  2/9- MRI evidence of discitis osteomyelitis and a component of epidural abscess. Scheduled for IR aspiration/biopsy 2/9.   TOC team following and will assist with needs.  Expected Discharge Plan: Home/Self Care Barriers to Discharge: Continued Medical Work up   Patient Goals and CMS Choice            Expected Discharge Plan and Services   Discharge Planning Services: CM Consult                                          Prior Living Arrangements/Services   Lives with:: Spouse Patient language and need for interpreter reviewed:: Yes        Need for Family Participation in Patient Care: Yes (Comment) Care giver support system in place?: Yes (comment)   Criminal Activity/Legal Involvement Pertinent to Current Situation/Hospitalization: No - Comment as needed  Activities of Daily Living      Permission Sought/Granted                  Emotional Assessment Appearance:: Appears stated age     Orientation: : Oriented to Self Alcohol / Substance Use: Not Applicable Psych Involvement: No (comment)  Admission diagnosis:  Abnormal CT scan [R93.89] Acute encephalopathy [G93.40] Acute midline low back pain without sciatica [M54.50] Dementia, unspecified dementia severity, unspecified dementia type, unspecified whether  behavioral, psychotic, or mood disturbance or anxiety (HCC) [F03.90] Patient Active Problem List   Diagnosis Date Noted   Lytic lesion of bone on x-ray 10/06/2022   Hypokalemia 10/06/2022   SIRS (systemic inflammatory response syndrome) ([place]) 10/06/2022   Acute encephalopathy 10/05/2022   Spinal stenosis of lumbar region with neurogenic claudication 09/21/2022   Encounter for palliative care involving management of pain 09/21/2022   Late onset Alzheimer's dementia without behavioral disturbance ([place]) 09/21/2020   Iron deficiency anemia secondary to inadequate dietary iron intake 06/06/2019   Chronic diastolic CHF (congestive heart failure) ([place]) 10/30/2018   CRI (chronic renal insufficiency), stage 3 (moderate) 10/25/2018   Venous stasis dermatitis of both lower extremities 01/29/2018   COPD with asthma 03/02/2017   Obesity (BMI 30.0-34.9) 04/01/2013   BPH (benign prostatic hyperplasia) 09/08/2011   B12 deficiency 08/13/2010   DM2 (diabetes mellitus, type 2) ([place]) 11/10/2008   Vitamin D deficiency 11/10/2008   HLD (hyperlipidemia) 11/10/2008   Essential hypertension 11/10/2008   Osteoarthritis 11/10/2008   PCP:  [name], MD Pharmacy:   [place] [facility] [phone] [address], [place] [address] AT [facility] [address] [place] [address] Phone: [phone] Fax: [phone]  [facility] [place], [place] [address] [place] [address]  Phone: [phone] Fax: [phone]  [facility] [place], [place] [place] [address] Phone: [phone] Fax: [phone]  [place], [address] [place] [place] [address] Phone: [phone] Fax: [phone]  [id number] [id number]: [phone]:  [phone]    Social Determinants of Health (SDOH) Social History: [place] No Food Insecurity (07/11/2022)  Housing: Low Risk  (07/11/2022)  Transportation Needs: No Transportation Needs (07/11/2022)  Utilities: Not At Risk (07/11/2022)  Alcohol Screen: Low Risk  (07/11/2022)  Depression (PHQ2-9): Low Risk  (09/07/2022)  Financial Resource Strain: Low Risk  (07/11/2022)  Physical Activity: Inactive (07/09/2021)  Social Connections: Moderately Isolated (07/11/2022)  Stress: No Stress Concern Present (07/11/2022)  Tobacco Use: Low Risk  (10/06/2022)   SDOH Interventions:     Readmission Risk Interventions     No data to display

## 2022-10-06 NOTE — Evaluation (Addendum)
Occupational Therapy Evaluation Patient Details Name: Nicholas Burton MRN: [id number] DOB: November 13, 1927 Today's Date: 10/06/2022   History of Present Illness 89 y.o. male with medical history significant for dementia, type 2 diabetes mellitus, chronic diastolic heart failure, essential hypertension, hyperlipidemia, admitted 2/7 with acute encephalopathy. MRI evidence of discitis osteomyelitis and a component of epidural abscess.  Scheduled for IR aspiration/biopsy 2/9.   Clinical Impression   Patient admitted for the diagnosis above.  Tried to ascertain prior level of function and home setup, got as much information as possible given extreme HOH, and difficult to communicate.  Apparently he lives with his wife, he mentioned visiting nurses, but unsure what they may help with.  He admits to recent falls, and increasing difficulty with ADL and mobility due to worsening back pain.  Currently he is moving in the room with supervision to [facility] level, and is needing Min A for ALD completion at sit to stand level.  He is very nice, and should do well post surgery.  OT will continue efforts in the acute setting to address deficits and monitor status after surgery.  Initially no post acute OT is anticipated, but that could change after surgery.        Recommendations for follow up therapy are one component of a multi-disciplinary discharge planning process, led by the attending physician.  Recommendations may be updated based on patient status, additional functional criteria and insurance authorization.   Follow Up Recommendations  No OT follow up     Assistance Recommended at Discharge Set up Supervision/Assistance  Patient can return home with the following Assist for transportation    Functional Status Assessment  Patient has had a recent decline in their functional status and demonstrates the ability to make significant improvements in function in a reasonable and predictable amount of time.   Equipment Recommendations  None recommended by OT    Recommendations for Other Services       Precautions / Restrictions Precautions Precautions: Fall Restrictions Weight Bearing Restrictions: No      Mobility Bed Mobility               General bed mobility comments: up in recliner    Transfers Overall transfer level: Needs assistance Equipment used: Rolling walker (2 wheels) Transfers: Sit to/from Stand, Bed to chair/wheelchair/BSC Sit to Stand: Supervision     Step pivot transfers: Min guard            Balance Overall balance assessment: Needs assistance Sitting-balance support: Feet supported Sitting balance-Leahy Scale: Good     Standing balance support: Reliant on assistive device for balance Standing balance-Leahy Scale: Poor Standing balance comment: able to static stand at sink to wash hands                           ADL either performed or assessed with clinical judgement   ADL Overall ADL's : Needs assistance/impaired Eating/Feeding: Independent;Sitting   Grooming: Wash/dry hands;Supervision/safety;Standing           Upper Body Dressing : Set up;Sitting   Lower Body Dressing: Minimal assistance;Sit to/from stand   Toilet Transfer: Min guard;Regular Toilet;Rolling walker (2 wheels);Ambulation   Toileting- Clothing Manipulation and Hygiene: Supervision/safety;Sitting/lateral lean               Vision Baseline Vision/History: 1 Wears glasses Patient Visual Report: No change from baseline       Perception     Praxis  Pertinent Vitals/Pain Pain Assessment Pain Assessment: Faces Faces Pain Scale: Hurts little more Pain Location: Back Pain Descriptors / Indicators: Tender Pain Intervention(s): Monitored during session     Hand Dominance Right   Extremity/Trunk Assessment Upper Extremity Assessment Upper Extremity Assessment: Overall WFL for tasks assessed   Lower Extremity Assessment Lower Extremity  Assessment: Defer to PT evaluation   Cervical / Trunk Assessment Cervical / Trunk Assessment: Kyphotic   Communication Communication Communication: HOH   Cognition Arousal/Alertness: Awake/alert Behavior During Therapy: WFL for tasks assessed/performed Overall Cognitive Status: Within Functional Limits for tasks assessed                                 General Comments: Very HOH     General Comments   VSS on RA    Exercises     Shoulder Instructions      Home Living Family/patient expects to be discharged to:: Private residence Living Arrangements: Spouse/significant other Available Help at Discharge: Family;Available 24 hours/day Type of Home: House Home Access: Stairs to enter [employer] of Steps: 3   Home Layout: One level;Laundry or work area in basement;Able to live on main level with bedroom/bathroom     Bathroom Shower/Tub: Tub/shower unit;Walk-in shower   Bathroom Toilet: Standard Bathroom Accessibility: Yes How Accessible: Accessible via walker Home Equipment: [place] (2 wheels);Cane - single point;Adaptive equipment Adaptive Equipment: Sock aid;Reacher        Prior Functioning/Environment Prior Level of Function : History of Falls (last six months)               ADLs Comments: Needing more assist with ADL and iADL over the last 2 week because of LBP.        OT Problem List: Pain;Impaired balance (sitting and/or standing)      OT Treatment/Interventions: Self-care/ADL training;Therapeutic activities;Patient/family education;Balance training    OT Goals(Current goals can be found in the care plan section) Acute Rehab OT Goals Patient Stated Goal: Hoping to go home, but open to rehab if needed. OT Goal Formulation: With patient Time For Goal Achievement: 10/20/22 Potential to Achieve Goals: Good ADL Goals Pt Will Perform Grooming: with modified independence;standing Pt Will Perform Lower Body Dressing: with  modified independence;sit to/from stand;with adaptive equipment Pt Will Transfer to Toilet: with modified independence;ambulating;regular height toilet  OT Frequency: Min 2X/week    Co-evaluation              AM-PAC OT "6 Clicks" Daily Activity     Outcome Measure Help from another person eating meals?: None Help from another person taking care of personal grooming?: A Little Help from another person toileting, which includes using toliet, bedpan, or urinal?: A Little Help from another person bathing (including washing, rinsing, drying)?: A Little Help from another person to put on and taking off regular upper body clothing?: None Help from another person to put on and taking off regular lower body clothing?: A Little 6 Click Score: 20   End of Session Equipment Utilized During Treatment: Rolling walker (2 wheels) Nurse Communication: Mobility status  Activity Tolerance: Patient tolerated treatment well Patient left: in chair;with call bell/phone within reach;with chair alarm set  OT Visit Diagnosis: Unsteadiness on feet (R26.81)                Time: 5027-7412 OT Time Calculation (min): 22 min Charges:  OT General Charges $OT Visit: 1 Visit OT Evaluation $OT Eval Moderate Complexity:  1 Mod  10/06/2022  RP, OTR/L  Acute Rehabilitation Services  Office:  [place] 10/06/2022, 4:23 PM

## 2022-10-07 ENCOUNTER — Inpatient Hospital Stay (HOSPITAL_COMMUNITY): Payer: Medicare Other

## 2022-10-07 DIAGNOSIS — M545 Low back pain, unspecified: Secondary | ICD-10-CM

## 2022-10-07 DIAGNOSIS — R9389 Abnormal findings on diagnostic imaging of other specified body structures: Secondary | ICD-10-CM

## 2022-10-07 DIAGNOSIS — F0390 Unspecified dementia without behavioral disturbance: Secondary | ICD-10-CM

## 2022-10-07 HISTORY — PX: IR FLUORO GUIDED NEEDLE PLC ASPIRATION/INJECTION LOC: IMG2395

## 2022-10-07 LAB — CBC
HCT: 39 % (ref 39.0–52.0)
Hemoglobin: 12.7 g/dL — ABNORMAL LOW (ref 13.0–17.0)
MCH: 29 pg (ref 26.0–34.0)
MCHC: 32.6 g/dL (ref 30.0–36.0)
MCV: 89 fL (ref 80.0–100.0)
Platelets: 194 10*3/uL (ref 150–400)
RBC: 4.38 MIL/uL (ref 4.22–5.81)
RDW: 15.3 % (ref 11.5–15.5)
WBC: 15.1 10*3/uL — ABNORMAL HIGH (ref 4.0–10.5)
nRBC: 0 % (ref 0.0–0.2)

## 2022-10-07 LAB — GLUCOSE, CAPILLARY
Glucose-Capillary: 133 mg/dL — ABNORMAL HIGH (ref 70–99)
Glucose-Capillary: 83 mg/dL (ref 70–99)
Glucose-Capillary: 102 mg/dL — ABNORMAL HIGH (ref 70–99)
Glucose-Capillary: 180 mg/dL — ABNORMAL HIGH (ref 70–99)

## 2022-10-07 LAB — PROTIME-INR
INR: 1.1 (ref 0.8–1.2)
Prothrombin Time: 13.7 seconds (ref 11.4–15.2)

## 2022-10-07 MED ORDER — FENTANYL CITRATE (PF) 100 MCG/2ML IJ SOLN
INTRAMUSCULAR | Status: AC
  Filled 2022-10-07: qty 4

## 2022-10-07 MED ORDER — BUPIVACAINE HCL (PF) 0.5 % IJ SOLN
INTRAMUSCULAR | Status: AC
  Administered 2022-10-07: 20 mL
  Filled 2022-10-07: qty 30

## 2022-10-07 MED ORDER — SODIUM CHLORIDE 0.9 % IV SOLN
8.0000 mg/kg | Freq: Every day | INTRAVENOUS | Status: DC
  Administered 2022-10-07 – 2022-10-08 (×2): 600 mg via INTRAVENOUS
  Filled 2022-10-07 (×3): qty 12

## 2022-10-07 MED ORDER — FENTANYL CITRATE (PF) 100 MCG/2ML IJ SOLN
INTRAMUSCULAR | Status: AC | PRN
  Administered 2022-10-07: 25 ug via INTRAVENOUS

## 2022-10-07 MED ORDER — BUPIVACAINE HCL (PF) 0.5 % IJ SOLN
INTRAMUSCULAR | Status: AC
  Filled 2022-10-07: qty 30

## 2022-10-07 MED ORDER — METOPROLOL TARTRATE 12.5 MG HALF TABLET
12.5000 mg | ORAL_TABLET | Freq: Two times a day (BID) | ORAL | Status: DC
  Administered 2022-10-07 – 2022-10-08 (×4): 12.5 mg via ORAL
  Filled 2022-10-07 (×5): qty 1

## 2022-10-07 MED ORDER — MIDAZOLAM HCL 2 MG/2ML IJ SOLN
INTRAMUSCULAR | Status: AC | PRN
  Administered 2022-10-07 (×2): .5 mg via INTRAVENOUS

## 2022-10-07 MED ORDER — MIDAZOLAM HCL 2 MG/2ML IJ SOLN
INTRAMUSCULAR | Status: AC
  Filled 2022-10-07: qty 4

## 2022-10-07 NOTE — Progress Notes (Signed)
This chaplain responded to PMT NP-Michele consult for creating/updating the Pt. Advance Directive.  The Pt. is post procedure at the time of the chaplain visit. Family is at the bedside. The chaplain left AD education at the bedside for review. The chaplain will plan a revisit.  Chaplain Sallyanne Kuster [phone]

## 2022-10-07 NOTE — Progress Notes (Signed)
PT Cancellation Note  Patient Details Name: Nicholas Burton MRN: :[id number] DOB: 1927-10-21   Cancelled Treatment:    Reason Eval/Treat Not Completed: Other (comment) per chart review, scheduled for likely procedure today, will follow from a distance and return if appropriate/ready and time/schedule allow. Also, resting HR a bit high this morning (most recent measure 109bpm, or 85% of max HR with limited HR reserve given his age)- will continue to monitor this as well.   Deniece Ree PT DPT PN2

## 2022-10-07 NOTE — Progress Notes (Signed)
Pharmacy Antibiotic Note  Nicholas Burton is a 89 y.o. male admitted on 10/05/2022 with discitis/osteo - now s/p IR aspirate.  Pharmacy has been consulted for Daptomycin dosing.  Plan: - Start Daptomycin 600 mg (8 mg/kg) IV every 24 hours - CK tomorrow AM, then weekly on Fridays - Will monitor renal fxn for any necessary dose adjustments, monitor cultures for need to broaden or narrow  Height: 5' 9"$  (175.3 cm) Weight: 77.1 kg (170 lb) IBW/kg (Calculated) : 70.7  Temp (24hrs), Avg:97.9 F (36.6 C), Min:97.7 F (36.5 C), Max:98 F (36.7 C)  Recent Labs  Lab 10/05/22 2022 10/06/22 0216 10/07/22 0326  WBC 13.8* 12.2* 15.1*  CREATININE 1.35* 1.22  --     Estimated Creatinine Clearance: 38.6 mL/min (by C-G formula based on SCr of 1.22 mg/dL).    Allergies  Allergen Reactions   Ace Inhibitors Cough   Oxycodone Itching and Rash   Penicillins Itching and Rash    Thank you for allowing pharmacy to be a part of this patient's care.  Alycia Rossetti, PharmD, BCPS Infectious Diseases Clinical Pharmacist 10/07/2022 3:19 PM   **Pharmacist phone directory can now be found on [email or web address] ([email or web address]).  Listed under [place].

## 2022-10-07 NOTE — Procedures (Signed)
INR.   Status post L3 core biopsy x 2 passes with a 16-gauge core biopsy needle.  Fluoroscopic guidance utilized.  Core samples obtained sent for analysis as per requesting MD.  No acute complications.  Patient tolerated procedure well.  Arlean Hopping MD.

## 2022-10-07 NOTE — Progress Notes (Signed)
PROGRESS NOTE    Nicholas Burton  [id number] DOB: 08-Dec-1927 DOA: 10/05/2022 PCP: [name], MD    Brief Narrative:  Nicholas Burton is a 89 y.o. male with medical history significant for dementia, type 2 diabetes mellitus, chronic diastolic heart failure, essential hypertension, hyperlipidemia, who is admitted to [facility] on 10/05/2022 with acute encephalopathy after presenting from home to [facility] ED for evaluation of confusion.   He has recently, over the last 2 weeks, been complaining of new onset low back pain as well as new onset left lower extremity weakness, without any reported sensory changes, including no report of associated saddle anesthesia. No report of any urinary changes, including no report of acute urinary retention nor any report of fecal incontinence. No report of preceding trauma.   Awaiting biopsy-- asked for cytology to be sent as well as culture  Assessment and Plan:  acute encephalopathy:  -1 to 2 days of confusion, with underlying etiology not entirely clear at this time.  Differential includes potential infectious process in the context of possible discitis versus osteomyelitis in involving the lumbar spine, as further detailed below. -delirium precautions     Lytic destructive pattern of the lumbar vertebral bodies involving L2-L4: As further detailed above, with underlying etiology not entirely clear, but with current differential including osteomyelitis versus discitis versus pathological fracture from malignancy.  -seen by NS: reluctant to recommend any surgery at this time discitis osteomyelitis should continue with medical workup to include blood cultures urine cultures laboratory markers and see if we can identify bacteria. Would certainly consult infectious disease and start the patient on empiric antibiotics once cultures are obtained consideration certainly could be given to a CT aspiration although there is no frank fluid collection to target with  that. At this point I think surgery is a high risk for him and consider maximizing medical management.  -ID consulted- recommend biopsy-- has not been on abx yet -palliative care consult for GOC-- family would like to pursue biopsy  -IR consult for biopsy    Hypo-kalemia: Presenting to potassium level mildly low at 3.3. -replete   Type 2 Diabetes Mellitus:  - Home insulin regimen: Lantus 30 units subcu nightly. Home oral hypoglycemic agents: Metformin, apical fluids and. presenting blood sugar: 215. Most recent A1c noted to be 8.0 when checked in November 2023.  -SSI, semglee    Chronic diastolic heart failure:  -documented history of such, with most recent echocardiogram performed in August 2020, which was notable for LVEF 60 to 65%, left ventricular diastolic parameters consistent with impaired relaxation, normal right ventricular systolic function.. No clinical or radiographic evidence to suggest acutely decompensated heart failure at this time      Essential Hypertension: -resume home meds as needed      Hyperlipidemia  -resume home med upon d/c   ? Alcohol use -monitor for withdrawal    PT/OT eval   DVT prophylaxis: SCDs Start: 10/05/22 2322    Code Status: Full Code Family Communication: son at bedside  Disposition Plan:  Level of care: Med-Surg Status is: Inpatient Remains inpatient appropriate because: needs procedure    Consultants:  IR ID NS Palliative care   Subjective: No overnight events-- son reports his mental status is clearing  Objective: Vitals:   10/06/22 1320 10/06/22 2105 10/07/22 0345 10/07/22 0742  BP: 109/71 128/67 (!) 143/86 (!) 154/87  Pulse: 97 88 (!) 101 (!) 109  Resp: 18   20  Temp: 98.9 F (37.2 C) 97.9  F (36.6 C) 97.9 F (36.6 C) 98 F (36.7 C)  TempSrc: Oral Oral Oral Oral  SpO2: 98% 98% 99% 98%    Intake/Output Summary (Last 24 hours) at 10/07/2022 1156 Last data filed at 10/06/2022 2100 Gross per 24 hour  Intake 120  ml  Output --  Net 120 ml   There were no vitals filed for this visit.  Examination:    General: Appearance:     Overweight male in no acute distress     Lungs:      respirations unlabored  Heart:    Tachycardic. Normal rhythm. No murmurs, rubs, or gallops.   MS:   All extremities are intact.   Neurologic:   Awake, alert       Data Reviewed: I have personally reviewed following labs and imaging studies  CBC: Recent Labs  Lab 10/05/22 2022 10/05/22 2032 10/06/22 0216 10/07/22 0326  WBC 13.8*  --  12.2* 15.1*  NEUTROABS 8.4*  --  7.7  --   HGB 12.9* 12.9* 12.5* 12.7*  HCT 39.1 38.0* 36.6* 39.0  MCV 88.3  --  86.1 89.0  PLT 201  --  182 [id number]   Basic Metabolic Panel: Recent Labs  Lab 10/05/22 2022 10/05/22 2032 10/06/22 0216  NA 136 138 137  K 3.3* 3.3* 3.3*  CL 104  --  104  CO2 22  --  22  GLUCOSE 215*  --  187*  BUN 15  --  13  CREATININE 1.35*  --  1.22  CALCIUM 8.8*  --  8.7*  MG 2.2  --  2.0   GFR: CrCl cannot be calculated (Unknown ideal weight.). Liver Function Tests: Recent Labs  Lab 10/05/22 2022 10/06/22 0216  AST 24 23  ALT 21 19  ALKPHOS 135* 130*  BILITOT 0.8 0.8  PROT 5.9* 5.4*  ALBUMIN 3.3* 3.0*   Recent Labs  Lab 10/05/22 2022  LIPASE 30   Recent Labs  Lab 10/05/22 2022  AMMONIA <10   Coagulation Profile: Recent Labs  Lab 10/06/22 0216 10/07/22 0326  INR 1.1 1.1   Cardiac Enzymes: No results for input(s): "CKTOTAL", "CKMB", "CKMBINDEX", "TROPONINI" in the last 168 hours. BNP (last 3 results) Recent Labs    07/26/22 1406  PROBNP 59.0   HbA1C: No results for input(s): "HGBA1C" in the last 72 hours. CBG: Recent Labs  Lab 10/06/22 1123 10/06/22 1654 10/06/22 2043 10/07/22 0813 10/07/22 1107  GLUCAP 257* 125* 147* 133* 83   Lipid Profile: No results for input(s): "CHOL", "HDL", "LDLCALC", "TRIG", "CHOLHDL", "LDLDIRECT" in the last 72 hours. Thyroid Function Tests: Recent Labs    10/06/22 0216  TSH  3.821   Anemia Panel: No results for input(s): "VITAMINB12", "FOLATE", "FERRITIN", "TIBC", "IRON", "RETICCTPCT" in the last 72 hours. Sepsis Labs: Recent Labs  Lab 10/06/22 0204  PROCALCITON <0.10    Recent Results (from the past 240 hour(s))  Resp panel by RT-PCR (RSV, Flu A&B, Covid) Anterior Nasal Swab     Status: None   Collection Time: 10/05/22 10:18 PM   Specimen: Anterior Nasal Swab  Result Value Ref Range Status   SARS Coronavirus 2 by RT PCR NEGATIVE NEGATIVE Final   Influenza A by PCR NEGATIVE NEGATIVE Final   Influenza B by PCR NEGATIVE NEGATIVE Final    Comment: (NOTE) The Xpert Xpress SARS-CoV-2/FLU/RSV plus assay is intended as an aid in the diagnosis of influenza from Nasopharyngeal swab specimens and should not be used as a sole basis for treatment. Nasal  washings and aspirates are unacceptable for Xpert Xpress SARS-CoV-2/FLU/RSV testing.  Fact Sheet for Patients: EntrepreneurPulse.com.au  Fact Sheet for Healthcare Providers: IncredibleEmployment.be  This test is not yet approved or cleared by the [place] FDA and has been authorized for detection and/or diagnosis of SARS-CoV-2 by FDA under an Emergency Use Authorization (EUA). This EUA will remain in effect (meaning this test can be used) for the duration of the COVID-19 declaration under Section 564(b)(1) of the Act, 21 U.S.C. section 360bbb-3(b)(1), unless the authorization is terminated or revoked.     Resp Syncytial Virus by PCR NEGATIVE NEGATIVE Final    Comment: (NOTE) Fact Sheet for Patients: EntrepreneurPulse.com.au  Fact Sheet for Healthcare Providers: IncredibleEmployment.be  This test is not yet approved or cleared by the [place] FDA and has been authorized for detection and/or diagnosis of SARS-CoV-2 by FDA under an Emergency Use Authorization (EUA). This EUA will remain in effect (meaning this test can be  used) for the duration of the COVID-19 declaration under Section 564(b)(1) of the Act, 21 U.S.C. section 360bbb-3(b)(1), unless the authorization is terminated or revoked.  Performed at [facility], [place] [address] [place], [place] [address]   Culture, blood (single) w Reflex to ID Panel     Status: None (Preliminary result)   Collection Time: 10/06/22  8:30 AM   Specimen: BLOOD  Result Value Ref Range Status   Specimen Description BLOOD RIGHT ANTECUBITAL  Final   Special Requests   Final    BOTTLES DRAWN AEROBIC AND ANAEROBIC Blood Culture results may not be optimal due to an excessive volume of blood received in culture bottles   Culture   Final    NO GROWTH 1 DAY Performed at [facility], [place] [address] [place], [place] [address]    Report Status PENDING  Incomplete         Radiology Studies: MR Lumbar Spine W Wo Contrast  Result Date: 10/06/2022 CLINICAL DATA:  Initial evaluation for low back pain, infection. EXAM: MRI LUMBAR SPINE WITHOUT AND WITH CONTRAST TECHNIQUE: Multiplanar and multiecho pulse sequences of the lumbar spine were obtained without and with intravenous contrast. CONTRAST:  7.39m GADAVIST GADOBUTROL 1 MMOL/ML IV SOLN COMPARISON:  CT from earlier the same day. FINDINGS: Segmentation:  Examination degraded by motion artifact. Standard segmentation. Lowest well-formed disc space labeled the L5-S1 level. Alignment:  Moderate levoscoliosis.  No significant listhesis. Vertebrae: Abnormal T1 hypointensity, with heterogeneous STIR hyperintensity and enhancement seen within the L3 vertebral body. Edema and enhancement noted within the adjacent L3-4 interspace as well as about the superior endplate of L4. Findings are nonspecific, but most concerning for possible osteomyelitis discitis. Surrounding paraspinous edema and inflammatory changes. No soft tissue collections. Abnormal enhancing material within the ventral epidural space at the level of L3 measures  approximately 2.9 x 1.4 cm (series 8, image 19). Findings concerning for epidural phlegmon. 1.6 cm nonenhancing material at the level could reflect a small epidural abscess versus disc protrusion (series 8, image 17). Ventral epidural enhancement extends from the L1-2 through L4-5 interspace. While these findings are favored to be infectious/inflammatory in nature, a possible underlying neoplastic process involving the L3 vertebral body remains difficult to exclude. No other convincing evidence for acute infection elsewhere within the lumbar spine. Reactive endplate changes about the L1-2, L2-3, and L5-S1 interspaces favored to be degenerative. Mild edema and enhancement about the L2-3 and L3-4 facets also favored to be degenerative. Underlying bone marrow signal intensity diffusely heterogeneous. No other  worrisome osseous lesions. Conus medullaris and cauda equina: Conus extends to the T12 level. Conus and cauda equina appear normal. Paraspinal and other soft tissues: Paraspinous edema and enhancement adjacent to the L3-4 interspace as above. No collections. Partially visualized urinary bladder is markedly distended with associated bladder wall trabeculation and/or diverticula. Mild bilateral hydroureteronephrosis, suspected to be related to bladder distension. Disc levels: L1-2: Advanced degenerative intervertebral disc space narrowing with diffuse disc bulge and disc desiccation. Reactive endplate spurring. Moderate right worse than left facet and ligament flavum hypertrophy. No significant spinal stenosis. Foramina remain patent. L2-3: Moderate degenerative intervertebral disc space narrowing with diffuse disc bulge, asymmetric to the left. Associated reactive endplate spurring. 1.6 cm nonenhancing material within the right ventral epidural space could reflect a right subarticular disc protrusion or possibly epidural abscess (series 8, image 17). Moderate facet and ligament flavum hypertrophy. Resultant mild  canal with moderate left lateral recess stenosis. Moderate left L2 foraminal narrowing. Right neural foramen remains patent. L3-4: Advanced intervertebral disc space narrowing with findings concerning for osteomyelitis discitis as above. Probable epidural phlegmon within the ventral epidural space at the level of L3 (series 8, image 19). Moderate left worse than right facet arthrosis. Resultant severe spinal stenosis. Moderate to severe left with mild right L3 foraminal narrowing. L4-5: Degenerative intervertebral disc space narrowing with disc desiccation and diffuse disc bulge. Left-sided reactive endplate spurring. Moderate facet hypertrophy. Resultant mild narrowing of the left lateral recess. Central canal remains patent. Mild to moderate left L4 foraminal narrowing. Right neural foramen remains patent. L5-S1: Degenerative intervertebral disc space narrowing with disc desiccation and diffuse disc bulge. Reactive endplate spurring. Moderate bilateral facet arthrosis. No significant spinal stenosis. Mild bilateral L5 foraminal narrowing. IMPRESSION: 1. Findings concerning for osteomyelitis discitis at the L3-4 level. Abnormal enhancing material within the ventral epidural space at the level of L3, concerning for epidural phlegmon. These changes superimposed on underlying spondylosis and facet arthrosis resultant severe spinal stenosis. While these changes are favored to reflect infection, a possible neoplastic process involving the L3 vertebral body remains difficult to exclude. Lett interval follow-up imaging may be helpful for further evaluation as warranted. 2. Surrounding paraspinous edema and enhancement, but with no drainable soft tissue collection collections. 3. Underlying advanced multilevel degenerative spondylosis and facet arthrosis as detailed above. No other high-grade spinal stenosis 4. Markedly distended urinary bladder with associated bladder wall trabeculation and/or diverticula. Clinical  correlation for chronic outlet obstruction recommended. Mild bilateral hydroureteronephrosis, suspected to be related to bladder distension. Electronically Signed   By: Jeannine Boga M.D.   On: 10/06/2022 00:47   CT Lumbar Spine Wo Contrast  Result Date: 10/05/2022 CLINICAL DATA:  Low back pain.  Increased fracture risk. EXAM: CT LUMBAR SPINE WITHOUT CONTRAST TECHNIQUE: Multidetector CT imaging of the lumbar spine was performed without intravenous contrast administration. Multiplanar CT image reconstructions were also generated. RADIATION DOSE REDUCTION: This exam was performed according to the departmental dose-optimization program which includes automated exposure control, adjustment of the mA and/or kV according to patient size and/or use of iterative reconstruction technique. COMPARISON:  Radiography 07/26/2022 FINDINGS: Segmentation: 5 lumbar type vertebral bodies. Alignment: Chronic mild S shaped scoliotic curvature. Vertebrae: Chronic degenerative endplate changes at the L1-2 level. Newly seen lytic destructive pattern throughout the L3 vertebral body, worse towards the left side. Question some involvement of the L2 vertebral body and superior aspect of the L4 vertebral body. Lucent focus within the central portion of the S1 vertebral body. These findings could be due to metastatic  disease, infection or both. See below. Paraspinal and other soft tissues: Distended bladder. Aortic atherosclerosis. Pronounced edematous change in the soft tissues surrounding the L3 vertebral body, worrisome in particular for infection but possibly consistent with tumor as well. Disc levels: T9-10 through T12-L1: Negative L1-2: Chronic disc degeneration with loss of disc height. Endplate osteophytes and mild bulging of the disc. No compressive stenosis. L2-3: Disc space narrowing and vacuum phenomenon. Circumferential bulging of the disc. Facet and ligamentous hypertrophy. Multifactorial stenosis at this level that  could cause neural compression. As noted above, there may be some lytic changes of the inferior L2 vertebral body. This raises the possibility of discitis osteomyelitis involving this level. L3-4: Lytic destructive appearance of the L3 vertebral body with loss of height more on the left. Edematous change of the paravertebral soft tissues. Bulging of the disc. Facet and ligamentous hypertrophy. Severe stenosis at this level. Epidural abscess not excluded. As noted above, there could be lesser destructive change of superior L4. Certainly the findings at this level or worrisome for discitis osteomyelitis. L4-5: Chronic disc degeneration with loss of disc height. Bulging of the disc. Facet and ligamentous hypertrophy. Moderate degenerative type stenosis. L5-S1: Chronic disc degeneration with loss of disc height. Endplate osteophytes and bulging of the disc. Facet degeneration. No canal stenosis. Mild bilateral foraminal stenosis. Lucent area within the S1 vertebral body which is indeterminate. IMPRESSION: 1. Lytic destructive pattern throughout the L3 vertebral body with loss of height more on the left. Question some involvement of the L2 vertebral body and superior aspect of the L4 vertebral body. Pronounced edematous change in the paravertebral soft tissues surrounding the L3 vertebral body, worrisome for infection but possibly consistent with tumor as well. 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 Severe stenosis at the L2-3 and L3-4 levels, possibly resulting in neurogenic bladder. Epidural abscess is not excluded. Is the patient an MR candidate? 2. Lucent area within the S1 vertebral body which is indeterminate. 3. Chronic degenerative changes at L1-2, L4-5 and L5-S1 as above. Aortic Atherosclerosis (ICD10-I70.0). Electronically Signed   By: Nelson Chimes M.D.   On: 10/05/2022 21:20   DG Chest 1 View  Result Date: 10/05/2022 CLINICAL DATA:  Altered mental status EXAM: CHEST  1 VIEW  COMPARISON:  06/02/2021 FINDINGS: Heart is normal size. Mediastinal contours within normal limits. Bibasilar atelectasis. No effusions or pneumothorax. No acute bony abnormality. IMPRESSION: Bibasilar atelectasis. Electronically Signed   By: Rolm Baptise M.D.   On: 10/05/2022 21:13   DG Pelvis 1-2 Views  Result Date: 10/05/2022 CLINICAL DATA:  Left hip and pelvic pain EXAM: PELVIS - 1-2 VIEW COMPARISON:  None Available. FINDINGS: Mild symmetric degenerative changes in the hips with joint space narrowing and spurring. No acute bony abnormality. Specifically, no fracture, subluxation, or dislocation. Degenerative changes in the visualized lower lumbar spine. IMPRESSION: No acute bony abnormality. Electronically Signed   By: Rolm Baptise M.D.   On: 10/05/2022 21:13   CT Head Wo Contrast  Result Date: 10/05/2022 CLINICAL DATA:  Delirium.  Low back pain. EXAM: CT HEAD WITHOUT CONTRAST TECHNIQUE: Contiguous axial images were obtained from the base of the skull through the vertex without intravenous contrast. RADIATION DOSE REDUCTION: This exam was performed according to the departmental dose-optimization program which includes automated exposure control, adjustment of the mA and/or kV according to patient size and/or use of iterative reconstruction technique. COMPARISON:  MRI 02/28/2004 FINDINGS: Brain: Generalized brain volume loss. Moderate chronic small-vessel ischemic changes of the  white matter. No sign of acute infarction, mass lesion, hemorrhage, hydrocephalus or extra-axial collection. Vascular: There is atherosclerotic calcification of the major vessels at the base of the brain. Skull: Negative Sinuses/Orbits: Opacified right maxillary sinus.  Orbits negative. Other: None IMPRESSION: 1. No acute CT finding. Atrophy and chronic small-vessel ischemic changes of the white matter. 2. Opacified right maxillary sinus. Electronically Signed   By: Nelson Chimes M.D.   On: 10/05/2022 21:11        Scheduled  Meds:  acetaminophen  1,000 mg Oral TID   insulin aspart  0-15 Units Subcutaneous TID WC   insulin aspart  0-5 Units Subcutaneous QHS   insulin glargine-yfgn  10 Units Subcutaneous QHS   lidocaine  1 patch Transdermal Q24H   metoprolol tartrate  12.5 mg Oral BID   Continuous Infusions:   LOS: 2 days    Time spent: 45 minutes spent on chart review, discussion with nursing staff, consultants, updating family and interview/physical exam; more than 50% of that time was spent in counseling and/or coordination of care.    Geradine Girt, DO [facility] Available via Epic secure chat 7am-7pm After these hours, please refer to coverage provider listed on [email or web address] 10/07/2022, 11:56 AM

## 2022-10-07 NOTE — Progress Notes (Signed)
Subjective: Patient reports somnolent recently sedated for procedure  Objective: Vital signs in last 24 hours: Temp:  [97.7 F (36.5 C)-98 F (36.7 C)] 97.7 F (36.5 C) (02/09 1505) Pulse Rate:  [88-109] 93 (02/09 1505) Resp:  [14-22] 18 (02/09 1505) BP: (117-154)/(67-95) 117/88 (02/09 1505) SpO2:  [92 %-100 %] 92 % (02/09 1505) Weight:  [77.1 kg] 77.1 kg (02/09 1505)  Intake/Output from previous day: 02/08 0701 - 02/09 0700 In: 120 [P.O.:120] Out: -  Intake/Output this shift: No intake/output data recorded.  As above  Lab Results: Recent Labs    10/06/22 0216 10/07/22 0326  WBC 12.2* 15.1*  HGB 12.5* 12.7*  HCT 36.6* 39.0  PLT 182 194   BMET Recent Labs    10/05/22 2022 10/05/22 2032 10/06/22 0216  NA 136 138 137  K 3.3* 3.3* 3.3*  CL 104  --  104  CO2 22  --  22  GLUCOSE 215*  --  187*  BUN 15  --  13  CREATININE 1.35*  --  1.22  CALCIUM 8.8*  --  8.7*    Studies/Results: MR Lumbar Spine W Wo Contrast  Result Date: 10/06/2022 CLINICAL DATA:  Initial evaluation for low back pain, infection. EXAM: MRI LUMBAR SPINE WITHOUT AND WITH CONTRAST TECHNIQUE: Multiplanar and multiecho pulse sequences of the lumbar spine were obtained without and with intravenous contrast. CONTRAST:  7.34m GADAVIST GADOBUTROL 1 MMOL/ML IV SOLN COMPARISON:  CT from earlier the same day. FINDINGS: Segmentation:  Examination degraded by motion artifact. Standard segmentation. Lowest well-formed disc space labeled the L5-S1 level. Alignment:  Moderate levoscoliosis.  No significant listhesis. Vertebrae: Abnormal T1 hypointensity, with heterogeneous STIR hyperintensity and enhancement seen within the L3 vertebral body. Edema and enhancement noted within the adjacent L3-4 interspace as well as about the superior endplate of L4. Findings are nonspecific, but most concerning for possible osteomyelitis discitis. Surrounding paraspinous edema and inflammatory changes. No soft tissue collections.  Abnormal enhancing material within the ventral epidural space at the level of L3 measures approximately 2.9 x 1.4 cm (series 8, image 19). Findings concerning for epidural phlegmon. 1.6 cm nonenhancing material at the level could reflect a small epidural abscess versus disc protrusion (series 8, image 17). Ventral epidural enhancement extends from the L1-2 through L4-5 interspace. While these findings are favored to be infectious/inflammatory in nature, a possible underlying neoplastic process involving the L3 vertebral body remains difficult to exclude. No other convincing evidence for acute infection elsewhere within the lumbar spine. Reactive endplate changes about the L1-2, L2-3, and L5-S1 interspaces favored to be degenerative. Mild edema and enhancement about the L2-3 and L3-4 facets also favored to be degenerative. Underlying bone marrow signal intensity diffusely heterogeneous. No other worrisome osseous lesions. Conus medullaris and cauda equina: Conus extends to the T12 level. Conus and cauda equina appear normal. Paraspinal and other soft tissues: Paraspinous edema and enhancement adjacent to the L3-4 interspace as above. No collections. Partially visualized urinary bladder is markedly distended with associated bladder wall trabeculation and/or diverticula. Mild bilateral hydroureteronephrosis, suspected to be related to bladder distension. Disc levels: L1-2: Advanced degenerative intervertebral disc space narrowing with diffuse disc bulge and disc desiccation. Reactive endplate spurring. Moderate right worse than left facet and ligament flavum hypertrophy. No significant spinal stenosis. Foramina remain patent. L2-3: Moderate degenerative intervertebral disc space narrowing with diffuse disc bulge, asymmetric to the left. Associated reactive endplate spurring. 1.6 cm nonenhancing material within the right ventral epidural space could reflect a right subarticular disc protrusion or  possibly epidural  abscess (series 8, image 17). Moderate facet and ligament flavum hypertrophy. Resultant mild canal with moderate left lateral recess stenosis. Moderate left L2 foraminal narrowing. Right neural foramen remains patent. L3-4: Advanced intervertebral disc space narrowing with findings concerning for osteomyelitis discitis as above. Probable epidural phlegmon within the ventral epidural space at the level of L3 (series 8, image 19). Moderate left worse than right facet arthrosis. Resultant severe spinal stenosis. Moderate to severe left with mild right L3 foraminal narrowing. L4-5: Degenerative intervertebral disc space narrowing with disc desiccation and diffuse disc bulge. Left-sided reactive endplate spurring. Moderate facet hypertrophy. Resultant mild narrowing of the left lateral recess. Central canal remains patent. Mild to moderate left L4 foraminal narrowing. Right neural foramen remains patent. L5-S1: Degenerative intervertebral disc space narrowing with disc desiccation and diffuse disc bulge. Reactive endplate spurring. Moderate bilateral facet arthrosis. No significant spinal stenosis. Mild bilateral L5 foraminal narrowing. IMPRESSION: 1. Findings concerning for osteomyelitis discitis at the L3-4 level. Abnormal enhancing material within the ventral epidural space at the level of L3, concerning for epidural phlegmon. These changes superimposed on underlying spondylosis and facet arthrosis resultant severe spinal stenosis. While these changes are favored to reflect infection, a possible neoplastic process involving the L3 vertebral body remains difficult to exclude. Simmer interval follow-up imaging may be helpful for further evaluation as warranted. 2. Surrounding paraspinous edema and enhancement, but with no drainable soft tissue collection collections. 3. Underlying advanced multilevel degenerative spondylosis and facet arthrosis as detailed above. No other high-grade spinal stenosis 4. Markedly distended  urinary bladder with associated bladder wall trabeculation and/or diverticula. Clinical correlation for chronic outlet obstruction recommended. Mild bilateral hydroureteronephrosis, suspected to be related to bladder distension. Electronically Signed   By: Jeannine Boga M.D.   On: 10/06/2022 00:47   CT Lumbar Spine Wo Contrast  Result Date: 10/05/2022 CLINICAL DATA:  Low back pain.  Increased fracture risk. EXAM: CT LUMBAR SPINE WITHOUT CONTRAST TECHNIQUE: Multidetector CT imaging of the lumbar spine was performed without intravenous contrast administration. Multiplanar CT image reconstructions were also generated. RADIATION DOSE REDUCTION: This exam was performed according to the departmental dose-optimization program which includes automated exposure control, adjustment of the mA and/or kV according to patient size and/or use of iterative reconstruction technique. COMPARISON:  Radiography 07/26/2022 FINDINGS: Segmentation: 5 lumbar type vertebral bodies. Alignment: Chronic mild S shaped scoliotic curvature. Vertebrae: Chronic degenerative endplate changes at the L1-2 level. Newly seen lytic destructive pattern throughout the L3 vertebral body, worse towards the left side. Question some involvement of the L2 vertebral body and superior aspect of the L4 vertebral body. Lucent focus within the central portion of the S1 vertebral body. These findings could be due to metastatic disease, infection or both. See below. Paraspinal and other soft tissues: Distended bladder. Aortic atherosclerosis. Pronounced edematous change in the soft tissues surrounding the L3 vertebral body, worrisome in particular for infection but possibly consistent with tumor as well. Disc levels: T9-10 through T12-L1: Negative L1-2: Chronic disc degeneration with loss of disc height. Endplate osteophytes and mild bulging of the disc. No compressive stenosis. L2-3: Disc space narrowing and vacuum phenomenon. Circumferential bulging of the  disc. Facet and ligamentous hypertrophy. Multifactorial stenosis at this level that could cause neural compression. As noted above, there may be some lytic changes of the inferior L2 vertebral body. This raises the possibility of discitis osteomyelitis involving this level. L3-4: Lytic destructive appearance of the L3 vertebral body with loss of height more on the left. Edematous  change of the paravertebral soft tissues. Bulging of the disc. Facet and ligamentous hypertrophy. Severe stenosis at this level. Epidural abscess not excluded. As noted above, there could be lesser destructive change of superior L4. Certainly the findings at this level or worrisome for discitis osteomyelitis. L4-5: Chronic disc degeneration with loss of disc height. Bulging of the disc. Facet and ligamentous hypertrophy. Moderate degenerative type stenosis. L5-S1: Chronic disc degeneration with loss of disc height. Endplate osteophytes and bulging of the disc. Facet degeneration. No canal stenosis. Mild bilateral foraminal stenosis. Lucent area within the S1 vertebral body which is indeterminate. IMPRESSION: 1. Lytic destructive pattern throughout the L3 vertebral body with loss of height more on the left. Question some involvement of the L2 vertebral body and superior aspect of the L4 vertebral body. Pronounced edematous change in the paravertebral soft tissues surrounding the L3 vertebral body, worrisome for infection but possibly consistent with tumor as well. 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 Severe stenosis at the L2-3 and L3-4 levels, possibly resulting in neurogenic bladder. Epidural abscess is not excluded. Is the patient an MR candidate? 2. Lucent area within the S1 vertebral body which is indeterminate. 3. Chronic degenerative changes at L1-2, L4-5 and L5-S1 as above. Aortic Atherosclerosis (ICD10-I70.0). Electronically Signed   By: Nelson Chimes M.D.   On: 10/05/2022 21:20   DG Chest 1  View  Result Date: 10/05/2022 CLINICAL DATA:  Altered mental status EXAM: CHEST  1 VIEW COMPARISON:  06/02/2021 FINDINGS: Heart is normal size. Mediastinal contours within normal limits. Bibasilar atelectasis. No effusions or pneumothorax. No acute bony abnormality. IMPRESSION: Bibasilar atelectasis. Electronically Signed   By: Rolm Baptise M.D.   On: 10/05/2022 21:13   DG Pelvis 1-2 Views  Result Date: 10/05/2022 CLINICAL DATA:  Left hip and pelvic pain EXAM: PELVIS - 1-2 VIEW COMPARISON:  None Available. FINDINGS: Mild symmetric degenerative changes in the hips with joint space narrowing and spurring. No acute bony abnormality. Specifically, no fracture, subluxation, or dislocation. Degenerative changes in the visualized lower lumbar spine. IMPRESSION: No acute bony abnormality. Electronically Signed   By: Rolm Baptise M.D.   On: 10/05/2022 21:13   CT Head Wo Contrast  Result Date: 10/05/2022 CLINICAL DATA:  Delirium.  Low back pain. EXAM: CT HEAD WITHOUT CONTRAST TECHNIQUE: Contiguous axial images were obtained from the base of the skull through the vertex without intravenous contrast. RADIATION DOSE REDUCTION: This exam was performed according to the departmental dose-optimization program which includes automated exposure control, adjustment of the mA and/or kV according to patient size and/or use of iterative reconstruction technique. COMPARISON:  MRI 02/28/2004 FINDINGS: Brain: Generalized brain volume loss. Moderate chronic small-vessel ischemic changes of the white matter. No sign of acute infarction, mass lesion, hemorrhage, hydrocephalus or extra-axial collection. Vascular: There is atherosclerotic calcification of the major vessels at the base of the brain. Skull: Negative Sinuses/Orbits: Opacified right maxillary sinus.  Orbits negative. Other: None IMPRESSION: 1. No acute CT finding. Atrophy and chronic small-vessel ischemic changes of the white matter. 2. Opacified right maxillary sinus.  Electronically Signed   By: Nelson Chimes M.D.   On: 10/05/2022 21:11    Assessment/Plan: 89 year old gentleman admitted for osteomyelitis discitis epidural abscess neurologic appears stable mental status family reports slightly improved today patient did take some steps was moving his legs well.  I spent extensive time talking to family x-ray reviewed the MRI scan of his lumbar spine without talked about the aggressive medical management and how  I did not feel that surgery was in his best interest to the high risk nature at his age with comorbidities.  They all agreed organ to continue to maximize medical management at this point await cultures continue antibiotics appreciate infectious disease help as well.  LOS: 2 days     Elaina Hoops 10/07/2022, 3:35 PM

## 2022-10-08 DIAGNOSIS — F0390 Unspecified dementia without behavioral disturbance: Secondary | ICD-10-CM | POA: Diagnosis not present

## 2022-10-08 DIAGNOSIS — M545 Low back pain, unspecified: Secondary | ICD-10-CM | POA: Diagnosis not present

## 2022-10-08 DIAGNOSIS — Z515 Encounter for palliative care: Secondary | ICD-10-CM | POA: Diagnosis not present

## 2022-10-08 LAB — CBC
HCT: 36.7 % — ABNORMAL LOW (ref 39.0–52.0)
Hemoglobin: 11.9 g/dL — ABNORMAL LOW (ref 13.0–17.0)
MCH: 29 pg (ref 26.0–34.0)
MCHC: 32.4 g/dL (ref 30.0–36.0)
MCV: 89.5 fL (ref 80.0–100.0)
Platelets: 210 10*3/uL (ref 150–400)
RBC: 4.1 MIL/uL — ABNORMAL LOW (ref 4.22–5.81)
RDW: 15.5 % (ref 11.5–15.5)
WBC: 13.4 10*3/uL — ABNORMAL HIGH (ref 4.0–10.5)
nRBC: 0 % (ref 0.0–0.2)

## 2022-10-08 LAB — GLUCOSE, CAPILLARY
Glucose-Capillary: 142 mg/dL — ABNORMAL HIGH (ref 70–99)
Glucose-Capillary: 118 mg/dL — ABNORMAL HIGH (ref 70–99)
Glucose-Capillary: 253 mg/dL — ABNORMAL HIGH (ref 70–99)
Glucose-Capillary: 225 mg/dL — ABNORMAL HIGH (ref 70–99)
Glucose-Capillary: 135 mg/dL — ABNORMAL HIGH (ref 70–99)

## 2022-10-08 LAB — BASIC METABOLIC PANEL
Anion gap: 11 (ref 5–15)
BUN: 21 mg/dL (ref 8–23)
CO2: 22 mmol/L (ref 22–32)
Calcium: 8.4 mg/dL — ABNORMAL LOW (ref 8.9–10.3)
Chloride: 105 mmol/L (ref 98–111)
Creatinine, Ser: 1.93 mg/dL — ABNORMAL HIGH (ref 0.61–1.24)
GFR, Estimated: 32 mL/min — ABNORMAL LOW (ref >60)
Glucose, Bld: 163 mg/dL — ABNORMAL HIGH (ref 70–99)
Potassium: 3.6 mmol/L (ref 3.5–5.1)
Sodium: 138 mmol/L (ref 135–145)

## 2022-10-08 LAB — CK: Total CK: 79 U/L (ref 49–397)

## 2022-10-08 MED ORDER — TAMSULOSIN HCL 0.4 MG PO CAPS
0.4000 mg | ORAL_CAPSULE | Freq: Every day | ORAL | Status: DC
  Administered 2022-10-08 – 2022-10-14 (×7): 0.4 mg via ORAL
  Filled 2022-10-08 (×7): qty 1

## 2022-10-08 MED ORDER — TRAMADOL HCL 50 MG PO TABS
25.0000 mg | ORAL_TABLET | Freq: Two times a day (BID) | ORAL | Status: DC | PRN
  Filled 2022-10-08: qty 1

## 2022-10-08 NOTE — Progress Notes (Addendum)
Occupational Therapy Treatment Patient Details Name: Nicholas Burton MRN: [id number] DOB: Jan 30, 1928 Today's Date: 10/08/2022   History of present illness 89 y.o. male with medical history significant for dementia, type 2 diabetes mellitus, chronic diastolic heart failure, essential hypertension, hyperlipidemia, admitted 2/7 with acute encephalopathy. MRI evidence of discitis osteomyelitis and a component of epidural abscess.  Pt is s/p IR aspiration/biopsy 2/9.   OT comments  Pt progressing towards goals this session, needing min A for UB dressing/LB dressing when seated EOB. Pt min guard A for simulated toilet transfer and mobility, able to complete seated therex during session. Pt min A for bed mobility. Pt presenting with impairments listed below, will follow acutely. Updating d/c recommendation to [place].   Recommendations for follow up therapy are one component of a multi-disciplinary discharge planning process, led by the attending physician.  Recommendations may be updated based on patient status, additional functional criteria and insurance authorization.    Follow Up Recommendations  Home health OT     Assistance Recommended at Discharge Set up Supervision/Assistance  Patient can return home with the following  Assist for transportation;A little help with walking and/or transfers;A little help with bathing/dressing/bathroom;Assistance with cooking/housework   Equipment Recommendations  None recommended by OT    Recommendations for Other Services      Precautions / Restrictions Precautions Precautions: Fall Restrictions Weight Bearing Restrictions: No       Mobility Bed Mobility Overal bed mobility: Needs Assistance Bed Mobility: Sidelying to Sit, Sit to Sidelying   Sidelying to sit: Min assist     Sit to sidelying: Min assist General bed mobility comments: assist for trunk elevatoin and BLE's    Transfers Overall transfer level: Needs assistance Equipment used:  Rolling walker (2 wheels) Transfers: Sit to/from Stand Sit to Stand: Min assist           General transfer comment: min A from low bed/chair surface     Balance Overall balance assessment: Needs assistance Sitting-balance support: Feet supported Sitting balance-Leahy Scale: Good Sitting balance - Comments: can reach outside BOS without LOB   Standing balance support: Bilateral upper extremity supported Standing balance-Leahy Scale: Poor Standing balance comment: Reliant on UE support                           ADL either performed or assessed with clinical judgement   ADL Overall ADL's : Needs assistance/impaired                 Upper Body Dressing : Sitting;Minimal assistance Upper Body Dressing Details (indicate cue type and reason): donning gown on backside Lower Body Dressing: Minimal assistance;   Toilet Transfer: Financial planner (2 wheels);Ambulation Toilet Transfer Details (indicate cue type and reason): simulated via functional mobility         Functional mobility during ADLs: Min guard;Rolling walker (2 wheels)      Extremity/Trunk Assessment Upper Extremity Assessment Upper Extremity Assessment: Generalized weakness   Lower Extremity Assessment Lower Extremity Assessment: Defer to PT evaluation   Cervical / Trunk Assessment Cervical / Trunk Assessment: Kyphotic    Vision   Vision Assessment?: No apparent visual deficits   Perception Perception Perception: Not tested   Praxis Praxis Praxis: Not tested    Cognition Arousal/Alertness: Awake/alert Behavior During Therapy: WFL for tasks assessed/performed Overall Cognitive Status: History of cognitive impairments - at baseline  General Comments: Very HOH        Exercises Exercises: Other exercises Other Exercises Other Exercises: BUE functional reach x10    Shoulder Instructions       General Comments  VSS on RA, family present and supportive    Pertinent Vitals/ Pain       Pain Assessment Pain Assessment: Faces Pain Score: 2  Faces Pain Scale: Hurts a little bit Pain Location: Back Pain Descriptors / Indicators: Discomfort Pain Intervention(s): Limited activity within patient's tolerance, Monitored during session, Repositioned  Home Living Family/patient expects to be discharged to:: Private residence Living Arrangements: Spouse/significant other Available Help at Discharge: Family;Available 24 hours/day Type of Home: House Home Access: Stairs to enter [employer] of Steps: 3 Entrance Stairs-Rails: Can reach both Home Layout: One level;Laundry or work area in basement;Able to live on main level with bedroom/bathroom     Bathroom Shower/Tub: Tub/shower unit;Walk-in shower   Bathroom Toilet: Standard Bathroom Accessibility: Yes   Home Equipment: Conservation officer, nature (2 wheels);Cane - single point;Adaptive equipment Adaptive Equipment: Sock aid;Reacher        Prior Functioning/Environment              Frequency  Min 2X/week        Progress Toward Goals  OT Goals(current goals can now be found in the care plan section)  Progress towards OT goals: Progressing toward goals  Acute Rehab OT Goals Patient Stated Goal: to go home OT Goal Formulation: With patient Time For Goal Achievement: 10/20/22 Potential to Achieve Goals: Good ADL Goals Pt Will Perform Grooming: with modified independence;standing Pt Will Perform Lower Body Dressing: with modified independence;sit to/from stand;with adaptive equipment Pt Will Transfer to Toilet: with modified independence;ambulating;regular height toilet  Plan Discharge plan needs to be updated;Frequency remains appropriate    Co-evaluation                 AM-PAC OT "6 Clicks" Daily Activity     Outcome Measure   Help from another person eating meals?: None Help from another person taking care of personal  grooming?: A Little Help from another person toileting, which includes using toliet, bedpan, or urinal?: A Little Help from another person bathing (including washing, rinsing, drying)?: A Lot Help from another person to put on and taking off regular upper body clothing?: A Little Help from another person to put on and taking off regular lower body clothing?: A Little 6 Click Score: 18    End of Session Equipment Utilized During Treatment: Gait belt;Rolling walker (2 wheels)  OT Visit Diagnosis: Unsteadiness on feet (R26.81)   Activity Tolerance Patient tolerated treatment well   Patient Left in bed;with call bell/phone within reach;with bed alarm set;with family/visitor present   Nurse Communication Mobility status        Time: [id number] OT Time Calculation (min): 22 min  Charges: OT General Charges $OT Visit: 1 Visit OT Treatments $Self Care/Home Management : 8-22 mins  Renaye Rakers, OTD, OTR/L [employer] Preferred Acute Rehab [phone]   Renaye Rakers [place] 10/08/2022, 3:59 PM

## 2022-10-08 NOTE — Progress Notes (Signed)
PROGRESS NOTE    Nicholas Burton  [id number] DOB: 04-27-1928 DOA: 10/05/2022 PCP: [name], MD    Brief Narrative:  Nicholas Burton is a 89 y.o. male with medical history significant for dementia, type 2 diabetes mellitus, chronic diastolic heart failure, essential hypertension, hyperlipidemia, who is admitted to [facility] on 10/05/2022 with acute encephalopathy after presenting from home to [facility] ED for evaluation of confusion.   He has recently, over the last 2 weeks, been complaining of new onset low back pain as well as new onset left lower extremity weakness, without any reported sensory changes, including no report of associated saddle anesthesia. No report of any urinary changes, including no report of acute urinary retention nor any report of fecal incontinence. No report of preceding trauma.   S/p biopsy-- asked for cytology to be sent as well as culture.   Assessment and Plan:  acute encephalopathy:  -1 to 2 days of confusion, with underlying etiology not entirely clear at this time.  Differential includes potential infectious process in the context of possible discitis versus osteomyelitis in involving the lumbar spine, as further detailed below. -delirium precautions     Lytic destructive pattern of the lumbar vertebral bodies involving L2-L4: As further detailed above, with underlying etiology not entirely clear, but with current differential including osteomyelitis versus discitis versus pathological fracture from malignancy.  -seen by NS: reluctant to recommend any surgery at this time discitis osteomyelitis should continue with medical workup to include blood cultures urine cultures laboratory markers and see if we can identify bacteria. Would certainly consult infectious disease and start the patient on empiric antibiotics once cultures are obtained consideration certainly could be given to a CT aspiration although there is no frank fluid collection to target with that.  At this point I think surgery is a high risk for him and consider maximizing medical management.  -ID consulted- recommend biopsy-- has not been on abx yet -palliative care consult for GOC-- family would like to pursue biopsy  -IR consult for biopsy- done 2/9- culture and    Hypo-kalemia: Presenting to potassium level mildly low at 3.3. -replete   Type 2 Diabetes Mellitus:  - Home insulin regimen: Lantus 30 units subcu nightly. Home oral hypoglycemic agents: Metformin, apical fluids and. presenting blood sugar: 215. Most recent A1c noted to be 8.0 when checked in November 2023.  -SSI, semglee    Chronic diastolic heart failure:  -documented history of such, with most recent echocardiogram performed in August 2020, which was notable for LVEF 60 to 65%, left ventricular diastolic parameters consistent with impaired relaxation, normal right ventricular systolic function.. No clinical or radiographic evidence to suggest acutely decompensated heart failure at this time     Essential Hypertension: -resume home meds as needed   AKI -bladder scan -trend -avoid nephrotoxic meds -avoid hypotension    Hyperlipidemia  -resume home med upon d/c   ? Alcohol use -monitor for withdrawal    PT/OT eval   DVT prophylaxis: SCDs Start: 10/05/22 2322    Code Status: Full Code Family Communication: son at bedside 2/9  Disposition Plan:  Level of care: Med-Surg Status is: Inpatient Remains inpatient appropriate because: needs procedure    Consultants:  IR ID NS Palliative care   Subjective: Meeting with palliative care  Objective: Vitals:   10/07/22 1505 10/07/22 2058 10/08/22 0448 10/08/22 0736  BP: 117/88 104/76 (!) 149/81 (!) 151/93  Pulse: 93 98 (!) 102 (!) 109  Resp: 18 19 17  17  Temp: 97.7 F (36.5 C) 98 F (36.7 C) 97.7 F (36.5 C) 97.8 F (36.6 C)  TempSrc: Oral Oral Oral Oral  SpO2: 92% 100% 100% 97%  Weight: 77.1 kg     Height: 5' 9"$  (1.753 m)        Intake/Output Summary (Last 24 hours) at 10/08/2022 1103 Last data filed at 10/08/2022 0300 Gross per 24 hour  Intake --  Output 400 ml  Net -400 ml   Filed Weights   10/07/22 1505  Weight: 77.1 kg    Examination:     General: Appearance:     Overweight male in no acute distress     Lungs:     respirations unlabored  Heart:    Tachycardic.   MS:   All extremities are intact.   Neurologic:   Awake, alert, hard of hearing       Data Reviewed: I have personally reviewed following labs and imaging studies  CBC: Recent Labs  Lab 10/05/22 2022 10/05/22 2032 10/06/22 0216 10/07/22 0326 10/08/22 0426  WBC 13.8*  --  12.2* 15.1* 13.4*  NEUTROABS 8.4*  --  7.7  --   --   HGB 12.9* 12.9* 12.5* 12.7* 11.9*  HCT 39.1 38.0* 36.6* 39.0 36.7*  MCV 88.3  --  86.1 89.0 89.5  PLT 201  --  182 194 [id number]   Basic Metabolic Panel: Recent Labs  Lab 10/05/22 2022 10/05/22 2032 10/06/22 0216 10/08/22 0426  NA 136 138 137 138  K 3.3* 3.3* 3.3* 3.6  CL 104  --  104 105  CO2 22  --  22 22  GLUCOSE 215*  --  187* 163*  BUN 15  --  13 21  CREATININE 1.35*  --  1.22 1.93*  CALCIUM 8.8*  --  8.7* 8.4*  MG 2.2  --  2.0  --    GFR: Estimated Creatinine Clearance: 24.4 mL/min (A) (by C-G formula based on SCr of 1.93 mg/dL (H)). Liver Function Tests: Recent Labs  Lab 10/05/22 2022 10/06/22 0216  AST 24 23  ALT 21 19  ALKPHOS 135* 130*  BILITOT 0.8 0.8  PROT 5.9* 5.4*  ALBUMIN 3.3* 3.0*   Recent Labs  Lab 10/05/22 2022  LIPASE 30   Recent Labs  Lab 10/05/22 2022  AMMONIA <10   Coagulation Profile: Recent Labs  Lab 10/06/22 0216 10/07/22 0326  INR 1.1 1.1   Cardiac Enzymes: Recent Labs  Lab 10/08/22 0426  CKTOTAL 79   BNP (last 3 results) Recent Labs    07/26/22 1406  PROBNP 59.0   HbA1C: No results for input(s): "HGBA1C" in the last 72 hours. CBG: Recent Labs  Lab 10/07/22 0813 10/07/22 1107 10/07/22 1630 10/07/22 2053 10/08/22 0734   GLUCAP 133* 83 102* 180* 142*   Lipid Profile: No results for input(s): "CHOL", "HDL", "LDLCALC", "TRIG", "CHOLHDL", "LDLDIRECT" in the last 72 hours. Thyroid Function Tests: Recent Labs    10/06/22 0216  TSH 3.821   Anemia Panel: No results for input(s): "VITAMINB12", "FOLATE", "FERRITIN", "TIBC", "IRON", "RETICCTPCT" in the last 72 hours. Sepsis Labs: Recent Labs  Lab 10/06/22 0204  PROCALCITON <0.10    Recent Results (from the past 240 hour(s))  Resp panel by RT-PCR (RSV, Flu A&B, Covid) Anterior Nasal Swab     Status: None   Collection Time: 10/05/22 10:18 PM   Specimen: Anterior Nasal Swab  Result Value Ref Range Status   SARS Coronavirus 2 by RT PCR NEGATIVE NEGATIVE Final  Influenza A by PCR NEGATIVE NEGATIVE Final   Influenza B by PCR NEGATIVE NEGATIVE Final    Comment: (NOTE) The Xpert Xpress SARS-CoV-2/FLU/RSV plus assay is intended as an aid in the diagnosis of influenza from Nasopharyngeal swab specimens and should not be used as a sole basis for treatment. Nasal washings and aspirates are unacceptable for Xpert Xpress SARS-CoV-2/FLU/RSV testing.  Fact Sheet for Patients: EntrepreneurPulse.com.au  Fact Sheet for Healthcare Providers: IncredibleEmployment.be  This test is not yet approved or cleared by the [place] FDA and has been authorized for detection and/or diagnosis of SARS-CoV-2 by FDA under an Emergency Use Authorization (EUA). This EUA will remain in effect (meaning this test can be used) for the duration of the COVID-19 declaration under Section 564(b)(1) of the Act, 21 U.S.C. section 360bbb-3(b)(1), unless the authorization is terminated or revoked.     Resp Syncytial Virus by PCR NEGATIVE NEGATIVE Final    Comment: (NOTE) Fact Sheet for Patients: EntrepreneurPulse.com.au  Fact Sheet for Healthcare Providers: IncredibleEmployment.be  This test is not yet  approved or cleared by the [place] FDA and has been authorized for detection and/or diagnosis of SARS-CoV-2 by FDA under an Emergency Use Authorization (EUA). This EUA will remain in effect (meaning this test can be used) for the duration of the COVID-19 declaration under Section 564(b)(1) of the Act, 21 U.S.C. section 360bbb-3(b)(1), unless the authorization is terminated or revoked.  Performed at [facility], [place] [address] [place], [place] [address]   Culture, blood (single) w Reflex to ID Panel     Status: None (Preliminary result)   Collection Time: 10/06/22  8:30 AM   Specimen: BLOOD  Result Value Ref Range Status   Specimen Description BLOOD RIGHT ANTECUBITAL  Final   Special Requests   Final    BOTTLES DRAWN AEROBIC AND ANAEROBIC Blood Culture results may not be optimal due to an excessive volume of blood received in culture bottles   Culture   Final    NO GROWTH 2 DAYS Performed at [facility], [place] [address] [place], [place] [address]    Report Status PENDING  Incomplete         Radiology Studies: No results found.      Scheduled Meds:  acetaminophen  1,000 mg Oral TID   insulin aspart  0-15 Units Subcutaneous TID WC   insulin aspart  0-5 Units Subcutaneous QHS   insulin glargine-yfgn  10 Units Subcutaneous QHS   lidocaine  1 patch Transdermal Q24H   metoprolol tartrate  12.5 mg Oral BID   Continuous Infusions:  DAPTOmycin (CUBICIN) 600 mg in sodium chloride 0.9 % IVPB 600 mg (10/07/22 1800)     LOS: 3 days    Time spent: 45 minutes spent on chart review, discussion with nursing staff, consultants, updating family and interview/physical exam; more than 50% of that time was spent in counseling and/or coordination of care.    Geradine Girt, DO [facility] Available via Epic secure chat 7am-7pm After these hours, please refer to coverage provider listed on [email or web address] 10/08/2022, 11:03 AM

## 2022-10-08 NOTE — Progress Notes (Signed)
Intermittent cath performed with bladder scan of more than 570m noted in bladder 12037moutput some resistance noted during procedure with a small amount of blood released into urine bag MD informed

## 2022-10-08 NOTE — Progress Notes (Signed)
        Date: 10/08/2022  Patient name: Nicholas Burton  Medical record number: [id number]  Date of birth: [birth year]   When I failed to find the culture in culture data I called microbiology and UNFORTUNATELY it appears that the specimen collected by IR was only sent to pathology and processed by them  I also see that Surgical Pathology was ordered but NO CULTURE  SO we will not likely ID a pathogen in this patient unless he undergoes ANOTHER IR guided biopsy and grows soemthing that the daptomycin would kill  I really wish the issue with tissue meant for culture being sent to path instead would someday be solved but it persists as a problem here  We could run [place] but would like to know some more about what bigger plans are here    Alcide Evener 10/08/2022, 12:49 PM

## 2022-10-08 NOTE — Evaluation (Signed)
Physical Therapy Evaluation Patient Details Name: Nicholas Burton MRN: [place]:[id number] DOB: 13-Jul-1928 Today's Date: 10/08/2022  History of Present Illness  89 y.o. male with medical history significant for dementia, type 2 diabetes mellitus, chronic diastolic heart failure, essential hypertension, hyperlipidemia, admitted 2/7 with acute encephalopathy. MRI evidence of discitis osteomyelitis and a component of epidural abscess.  Pt is s/p IR aspiration/biopsy 2/9.  Clinical Impression  Pt admitted secondary to problem above with deficits below. Pt requiring Min to min guard A for mobility tasks this session. Tolerated ambulation well this session. Pt's family reports they would like [place] services at d/c, especially someone to assist with ADLs. Recommend HHPT/HHOT/HHaide at d/c. Will continue to follow acutely.        Recommendations for follow up therapy are one component of a multi-disciplinary discharge planning process, led by the attending physician.  Recommendations may be updated based on patient status, additional functional criteria and insurance authorization.  Follow Up Recommendations Home health PT (HHOT/HHaide)      Assistance Recommended at Discharge Intermittent Supervision/Assistance  Patient can return home with the following  A little help with bathing/dressing/bathroom;Assistance with cooking/housework;Direct supervision/assist for financial management;Assist for transportation    Equipment Recommendations None recommended by PT  Recommendations for Other Services       Functional Status Assessment Patient has had a recent decline in their functional status and demonstrates the ability to make significant improvements in function in a reasonable and predictable amount of time.     Precautions / Restrictions Precautions Precautions: Fall Restrictions Weight Bearing Restrictions: No      Mobility  Bed Mobility               General bed mobility comments: In recliner  upon entry    Transfers Overall transfer level: Needs assistance Equipment used: Rolling walker (2 wheels) Transfers: Sit to/from Stand Sit to Stand: Min assist           General transfer comment: Min A initially, but progressed to min guard for transfers.    Ambulation/Gait Ambulation/Gait assistance: Min guard Gait Distance (Feet): 150 Feet Assistive device: Rolling walker (2 wheels) Gait Pattern/deviations: Step-through pattern, Decreased stride length Gait velocity: Decreased     General Gait Details: Slow, cautious gait. No overt LOB noted. Pt requiring 1 seated rest secondary to fatigue.  Stairs            Wheelchair Mobility    Modified Rankin (Stroke Patients Only)       Balance Overall balance assessment: Needs assistance Sitting-balance support: Feet supported Sitting balance-Leahy Scale: Good     Standing balance support: Bilateral upper extremity supported Standing balance-Leahy Scale: Poor Standing balance comment: Reliant on UE support                             Pertinent Vitals/Pain Pain Assessment Pain Assessment: Faces Faces Pain Scale: Hurts a little bit Pain Location: Back Pain Descriptors / Indicators: Tender Pain Intervention(s): Limited activity within patient's tolerance, Monitored during session, Repositioned    Home Living Family/patient expects to be discharged to:: Private residence Living Arrangements: Spouse/significant other Available Help at Discharge: Family;Available 24 hours/day Type of Home: House Home Access: Stairs to enter Entrance Stairs-Rails: Can reach both Entrance Stairs-Number of Steps: 3   Home Layout: One level;Laundry or work area in basement;Able to live on main level with bedroom/bathroom Home Equipment: Conservation officer, nature (2 wheels);Cane - single point;Adaptive equipment  Prior Function Prior Level of Function : History of Falls (last six months)             Mobility Comments:  Using RW for mobility tasks       Hand Dominance   Dominant Hand: Right    Extremity/Trunk Assessment   Upper Extremity Assessment Upper Extremity Assessment: Defer to OT evaluation    Lower Extremity Assessment Lower Extremity Assessment: Generalized weakness    Cervical / Trunk Assessment Cervical / Trunk Assessment: Kyphotic  Communication   Communication: HOH  Cognition Arousal/Alertness: Awake/alert Behavior During Therapy: WFL for tasks assessed/performed Overall Cognitive Status: History of cognitive impairments - at baseline                                          General Comments General comments (skin integrity, edema, etc.): Spoke with family who reports pt would benefit from someone to help with bathing/dressing at home.    Exercises     Assessment/Plan    PT Assessment Patient needs continued PT services  PT Problem List Decreased strength;Decreased activity tolerance;Decreased balance;Decreased mobility       PT Treatment Interventions DME instruction;Gait training;Stair training;Functional mobility training;Therapeutic activities;Therapeutic exercise;Balance training;Patient/family education    PT Goals (Current goals can be found in the Care Plan section)  Acute Rehab PT Goals Patient Stated Goal: to go home PT Goal Formulation: With patient Time For Goal Achievement: 10/22/22 Potential to Achieve Goals: Good    Frequency Min 3X/week     Co-evaluation               AM-PAC PT "6 Clicks" Mobility  Outcome Measure Help needed turning from your back to your side while in a flat bed without using bedrails?: None Help needed moving from lying on your back to sitting on the side of a flat bed without using bedrails?: A Little Help needed moving to and from a bed to a chair (including a wheelchair)?: A Little Help needed standing up from a chair using your arms (e.g., wheelchair or bedside chair)?: A Little Help needed to  walk in hospital room?: A Little Help needed climbing 3-5 steps with a railing? : A Little 6 Click Score: 19    End of Session Equipment Utilized During Treatment: Gait belt Activity Tolerance: Patient tolerated treatment well Patient left: in chair;with call bell/phone within reach;with chair alarm set;with family/visitor present Nurse Communication: Mobility status PT Visit Diagnosis: Unsteadiness on feet (R26.81);Muscle weakness (generalized) (M62.81);Difficulty in walking, not elsewhere classified (R26.2)    Time: [id number] PT Time Calculation (min) (ACUTE ONLY): 21 min   Charges:   PT Evaluation $PT Eval Low Complexity: 1 Low          Reuel Derby, PT, DPT  Acute Rehabilitation Services  Office: [phone]   Rudean Hitt 10/08/2022, 1:11 PM

## 2022-10-08 NOTE — Progress Notes (Signed)
   Palliative Medicine Inpatient Follow Up Note HPI: 89 year old gentleman with confusion and altered mental status multiple medical comorbidities possible encephalopathy with CT and MRI evidence of discitis osteomyelitis and a component of epidural abscess.    The palliative care team has been asked to get involved for further goals of care conversations.  Today's Discussion 10/08/2022  *Please note that this is a verbal dictation therefore any spelling or grammatical errors are due to the "[employer] One" system interpretation.  Chart reviewed inclusive of vital signs, progress notes, laboratory results, and diagnostic images.   I met with Nicholas Burton and his son, Nicholas Burton this late morning.   I reviewed with Nicholas Burton that the biopsy of his L3 was completed yesterday. We reviewed that the results remain in process.   Patient shares that he continues to have pain in his lower back but does note some improvement. We discussed adding very low dose tramadol as needed which he is in agreement with.   Created space and opportunity for patient to explore thoughts feelings and fears regarding current medical situation. He shares his concerns for the future if he remains to be limited mobility-wise.    I spoke to patient and his son Nicholas Burton about Nicholas Burton's cardiopulmonary resuscitation status. We discussed that he is presently a Full Code. I shared all that this entails and the possible trauma it would cause his body. I suggested that he speak more to his family about his wishes moving forward. He and his son are agreeable to more conversations in the oncoming days as it related to this.   Plan for family meeting tomorrow at [facility].  Questions and concerns addressed/Palliative Support Provided.   Objective Assessment: Vital Signs Vitals:   10/08/22 0448 10/08/22 0736  BP: (!) 149/81 (!) 151/93  Pulse: (!) 102 (!) 109  Resp: 17 17  Temp: 97.7 F (36.5 C) 97.8 F (36.6 C)  SpO2: 100% 97%     Intake/Output Summary (Last 24 hours) at 10/08/2022 1418 Last data filed at 10/08/2022 0300 Gross per 24 hour  Intake --  Output 400 ml  Net -400 ml   Last Weight  Most recent update: 10/07/2022  3:16 PM    Weight  77.1 kg (170 lb)            Gen: Elderly caucasian M in NAD HEENT: Dry mucous membranes CV: Regular rate and rhythm  PULM:  On RA, breathing is even and nonlabored ABD: soft/nontender  EXT: No edema  Neuro: Alert and oriented x3  SUMMARY OF RECOMMENDATIONS   FULL CODE/FULL SCOPE OF CARE  Family meeting tomorrow at [place] at this time are for improvement   Weakness: PT/OT   Pain in lower pack: Tylenol 1g TID and Lidoderm patch (on 12 hrs off 12 hrs), low dose Tramadol '25mg'$  PO Q12H PRN    Ongoing Palliative support  Billing based on MDM: High ______________________________________________________________________________________ [facility] Team Team Cell Phone: [phone] Please utilize secure chat with additional questions, if there is no response within 30 minutes please call the above phone number  Palliative Medicine Team providers are available by phone from 7am to 7pm daily and can be reached through the team cell phone.  Should this patient require assistance outside of these hours, please call the patient's attending physician.

## 2022-10-09 DIAGNOSIS — Z7189 Other specified counseling: Secondary | ICD-10-CM | POA: Diagnosis not present

## 2022-10-09 DIAGNOSIS — M545 Low back pain, unspecified: Secondary | ICD-10-CM | POA: Diagnosis not present

## 2022-10-09 DIAGNOSIS — Z515 Encounter for palliative care: Secondary | ICD-10-CM | POA: Diagnosis not present

## 2022-10-09 DIAGNOSIS — F0390 Unspecified dementia without behavioral disturbance: Secondary | ICD-10-CM | POA: Diagnosis not present

## 2022-10-09 LAB — CBC
HCT: 32.3 % — ABNORMAL LOW (ref 39.0–52.0)
Hemoglobin: 10.8 g/dL — ABNORMAL LOW (ref 13.0–17.0)
MCH: 29.7 pg (ref 26.0–34.0)
MCHC: 33.4 g/dL (ref 30.0–36.0)
MCV: 88.7 fL (ref 80.0–100.0)
Platelets: 191 10*3/uL (ref 150–400)
RBC: 3.64 MIL/uL — ABNORMAL LOW (ref 4.22–5.81)
RDW: 15.4 % (ref 11.5–15.5)
WBC: 10.8 10*3/uL — ABNORMAL HIGH (ref 4.0–10.5)
nRBC: 0 % (ref 0.0–0.2)

## 2022-10-09 LAB — BASIC METABOLIC PANEL
Anion gap: 9 (ref 5–15)
BUN: 24 mg/dL — ABNORMAL HIGH (ref 8–23)
CO2: 24 mmol/L (ref 22–32)
Calcium: 8.1 mg/dL — ABNORMAL LOW (ref 8.9–10.3)
Chloride: 105 mmol/L (ref 98–111)
Creatinine, Ser: 2.17 mg/dL — ABNORMAL HIGH (ref 0.61–1.24)
GFR, Estimated: 28 mL/min — ABNORMAL LOW (ref >60)
Glucose, Bld: 111 mg/dL — ABNORMAL HIGH (ref 70–99)
Potassium: 3.3 mmol/L — ABNORMAL LOW (ref 3.5–5.1)
Sodium: 138 mmol/L (ref 135–145)

## 2022-10-09 LAB — GLUCOSE, CAPILLARY
Glucose-Capillary: 191 mg/dL — ABNORMAL HIGH (ref 70–99)
Glucose-Capillary: 335 mg/dL — ABNORMAL HIGH (ref 70–99)
Glucose-Capillary: 224 mg/dL — ABNORMAL HIGH (ref 70–99)
Glucose-Capillary: 340 mg/dL — ABNORMAL HIGH (ref 70–99)

## 2022-10-09 MED ORDER — METOPROLOL TARTRATE 25 MG PO TABS
25.0000 mg | ORAL_TABLET | Freq: Two times a day (BID) | ORAL | Status: DC
  Administered 2022-10-09 – 2022-10-14 (×11): 25 mg via ORAL
  Filled 2022-10-09 (×10): qty 1

## 2022-10-09 MED ORDER — INSULIN GLARGINE-YFGN 100 UNIT/ML ~~LOC~~ SOLN
13.0000 [IU] | Freq: Every day | SUBCUTANEOUS | Status: DC
  Administered 2022-10-09: 13 [IU] via SUBCUTANEOUS
  Filled 2022-10-09 (×2): qty 0.13

## 2022-10-09 MED ORDER — POLYETHYLENE GLYCOL 3350 17 G PO PACK
17.0000 g | PACK | Freq: Every day | ORAL | Status: DC
  Administered 2022-10-10 – 2022-10-13 (×4): 17 g via ORAL
  Filled 2022-10-09 (×6): qty 1

## 2022-10-09 MED ORDER — POTASSIUM CHLORIDE CRYS ER 20 MEQ PO TBCR
40.0000 meq | EXTENDED_RELEASE_TABLET | Freq: Once | ORAL | Status: AC
  Administered 2022-10-09: 40 meq via ORAL
  Filled 2022-10-09: qty 2

## 2022-10-09 MED ORDER — BISACODYL 10 MG RE SUPP
10.0000 mg | Freq: Once | RECTAL | Status: AC
  Administered 2022-10-09: 10 mg via RECTAL
  Filled 2022-10-09: qty 1

## 2022-10-09 MED ORDER — SODIUM CHLORIDE 0.9 % IV SOLN
8.0000 mg/kg | INTRAVENOUS | Status: DC
  Administered 2022-10-10: 600 mg via INTRAVENOUS
  Filled 2022-10-09: qty 12

## 2022-10-09 NOTE — Progress Notes (Signed)
PROGRESS NOTE    Nicholas Burton  [id number] DOB: Dec 17, 1927 DOA: 10/05/2022 PCP: [name], MD    Brief Narrative:  Nicholas Burton is a 89 y.o. male with medical history significant for dementia, type 2 diabetes mellitus, chronic diastolic heart failure, essential hypertension, hyperlipidemia, who is admitted to [facility] on 10/05/2022 with acute encephalopathy after presenting from home to [facility] ED for evaluation of confusion.   He has recently, over the last 2 weeks, been complaining of new onset low back pain as well as new onset left lower extremity weakness, without any reported sensory changes, including no report of associated saddle anesthesia. No report of any urinary changes, including no report of acute urinary retention nor any report of fecal incontinence. No report of preceding trauma.   S/p biopsy-- asked for cytology to be sent as well as culture.   Assessment and Plan:  acute encephalopathy:  -1 to 2 days of confusion, with underlying etiology not entirely clear at this time.  Differential includes potential infectious process in the context of possible discitis versus osteomyelitis in involving the lumbar spine, as further detailed below. -delirium precautions     Lytic destructive pattern of the lumbar vertebral bodies involving L2-L4: As further detailed above, with underlying etiology not entirely clear, but with current differential including osteomyelitis versus discitis versus pathological fracture from malignancy.  -seen by NS: reluctant to recommend any surgery at this time discitis osteomyelitis should continue with medical workup to include blood cultures urine cultures laboratory markers and see if we can identify bacteria. Would certainly consult infectious disease and start the patient on empiric antibiotics once cultures are obtained consideration certainly could be given to a CT aspiration although there is no frank fluid collection to target with that.  At this point I think surgery is a high risk for him and consider maximizing medical management.  -ID consulted- recommend biopsy-- has not been on abx yet -palliative care consult for GOC-- family would like to pursue biopsy  -IR consult for biopsy- done 2/9- culture and cytology-- appears only cytology done    Hypo-kalemia: Presenting to potassium level mildly low at 3.3. -repleted   Type 2 Diabetes Mellitus:  - Home insulin regimen: Lantus 30 units subcu nightly. Home oral hypoglycemic agents: Metformin, apical fluids and. presenting blood sugar: 215. Most recent A1c noted to be 8.0 when checked in November 2023.  -SSI, semglee    Chronic diastolic heart failure:  -documented history of such, with most recent echocardiogram performed in August 2020, which was notable for LVEF 60 to 65%, left ventricular diastolic parameters consistent with impaired relaxation, normal right ventricular systolic function.. No clinical or radiographic evidence to suggest acutely decompensated heart failure at this time     Essential Hypertension: -resume home meds as needed   AKI-appears to be due to acute urinary retention  -has required I/O cath x 3-- will place foley -avoid nephrotoxic meds -avoid hypotension  -bowel regimen   Hyperlipidemia  -resume home med upon d/c   ? Alcohol use -monitor for withdrawal    PT/OT eval   DVT prophylaxis: SCDs Start: 10/05/22 2322    Code Status: Full Code Family Communication: son at bedside 2/11  Disposition Plan:  Level of care: Med-Surg Status is: Inpatient Remains inpatient appropriate because: needs procedure    Consultants:  IR ID NS Palliative care   Subjective: Meeting with palliative care today No BM recently  Objective: Vitals:   10/08/22 1523 10/08/22 2107  10/09/22 0357 10/09/22 0855  BP: 132/71 (!) 120/56 (!) 143/80 (!) 159/77  Pulse: 89 96 (!) 102 99  Resp: 18 18 18 18  $ Temp: 98 F (36.7 C) 98.3 F (36.8 C) 97.7 F  (36.5 C) 97.8 F (36.6 C)  TempSrc: Oral  Oral Oral  SpO2: 100% 100% 100% 98%  Weight:      Height:        Intake/Output Summary (Last 24 hours) at 10/09/2022 1144 Last data filed at 10/09/2022 0800 Gross per 24 hour  Intake 482.69 ml  Output 2600 ml  Net -2117.31 ml   Filed Weights   10/07/22 1505  Weight: 77.1 kg    Examination:    General: Appearance:     Overweight male in no acute distress     Lungs:     respirations unlabored  Heart:    Normal heart rate.   MS:   All extremities are intact.   Neurologic:   Awake, alert       Data Reviewed: I have personally reviewed following labs and imaging studies  CBC: Recent Labs  Lab 10/05/22 2022 10/05/22 2032 10/06/22 0216 10/07/22 0326 10/08/22 0426 10/09/22 0341  WBC 13.8*  --  12.2* 15.1* 13.4* 10.8*  NEUTROABS 8.4*  --  7.7  --   --   --   HGB 12.9* 12.9* 12.5* 12.7* 11.9* 10.8*  HCT 39.1 38.0* 36.6* 39.0 36.7* 32.3*  MCV 88.3  --  86.1 89.0 89.5 88.7  PLT 201  --  182 194 210 [id number]   Basic Metabolic Panel: Recent Labs  Lab 10/05/22 2022 10/05/22 2032 10/06/22 0216 10/08/22 0426 10/09/22 0341  NA 136 138 137 138 138  K 3.3* 3.3* 3.3* 3.6 3.3*  CL 104  --  104 105 105  CO2 22  --  22 22 24  $ GLUCOSE 215*  --  187* 163* 111*  BUN 15  --  13 21 24*  CREATININE 1.35*  --  1.22 1.93* 2.17*  CALCIUM 8.8*  --  8.7* 8.4* 8.1*  MG 2.2  --  2.0  --   --    GFR: Estimated Creatinine Clearance: 21.7 mL/min (A) (by C-G formula based on SCr of 2.17 mg/dL (H)). Liver Function Tests: Recent Labs  Lab 10/05/22 2022 10/06/22 0216  AST 24 23  ALT 21 19  ALKPHOS 135* 130*  BILITOT 0.8 0.8  PROT 5.9* 5.4*  ALBUMIN 3.3* 3.0*   Recent Labs  Lab 10/05/22 2022  LIPASE 30   Recent Labs  Lab 10/05/22 2022  AMMONIA <10   Coagulation Profile: Recent Labs  Lab 10/06/22 0216 10/07/22 0326  INR 1.1 1.1   Cardiac Enzymes: Recent Labs  Lab 10/08/22 0426  CKTOTAL 79   BNP (last 3 results) Recent  Labs    07/26/22 1406  PROBNP 59.0   HbA1C: No results for input(s): "HGBA1C" in the last 72 hours. CBG: Recent Labs  Lab 10/08/22 1138 10/08/22 1713 10/08/22 2004 10/09/22 0857 10/09/22 1136  GLUCAP 253* 225* 135* 191* 335*   Lipid Profile: No results for input(s): "CHOL", "HDL", "LDLCALC", "TRIG", "CHOLHDL", "LDLDIRECT" in the last 72 hours. Thyroid Function Tests: No results for input(s): "TSH", "T4TOTAL", "FREET4", "T3FREE", "THYROIDAB" in the last 72 hours.  Anemia Panel: No results for input(s): "VITAMINB12", "FOLATE", "FERRITIN", "TIBC", "IRON", "RETICCTPCT" in the last 72 hours. Sepsis Labs: Recent Labs  Lab 10/06/22 0204  PROCALCITON <0.10    Recent Results (from the past 240 hour(s))  Resp panel  by RT-PCR (RSV, Flu A&B, Covid) Anterior Nasal Swab     Status: None   Collection Time: 10/05/22 10:18 PM   Specimen: Anterior Nasal Swab  Result Value Ref Range Status   SARS Coronavirus 2 by RT PCR NEGATIVE NEGATIVE Final   Influenza A by PCR NEGATIVE NEGATIVE Final   Influenza B by PCR NEGATIVE NEGATIVE Final    Comment: (NOTE) The Xpert Xpress SARS-CoV-2/FLU/RSV plus assay is intended as an aid in the diagnosis of influenza from Nasopharyngeal swab specimens and should not be used as a sole basis for treatment. Nasal washings and aspirates are unacceptable for Xpert Xpress SARS-CoV-2/FLU/RSV testing.  Fact Sheet for Patients: EntrepreneurPulse.com.au  Fact Sheet for Healthcare Providers: IncredibleEmployment.be  This test is not yet approved or cleared by the [place] FDA and has been authorized for detection and/or diagnosis of SARS-CoV-2 by FDA under an Emergency Use Authorization (EUA). This EUA will remain in effect (meaning this test can be used) for the duration of the COVID-19 declaration under Section 564(b)(1) of the Act, 21 U.S.C. section 360bbb-3(b)(1), unless the authorization is terminated  or revoked.     Resp Syncytial Virus by PCR NEGATIVE NEGATIVE Final    Comment: (NOTE) Fact Sheet for Patients: EntrepreneurPulse.com.au  Fact Sheet for Healthcare Providers: IncredibleEmployment.be  This test is not yet approved or cleared by the [place] FDA and has been authorized for detection and/or diagnosis of SARS-CoV-2 by FDA under an Emergency Use Authorization (EUA). This EUA will remain in effect (meaning this test can be used) for the duration of the COVID-19 declaration under Section 564(b)(1) of the Act, 21 U.S.C. section 360bbb-3(b)(1), unless the authorization is terminated or revoked.  Performed at [facility], [place] [address] [place], [place] [address]   Culture, blood (single) w Reflex to ID Panel     Status: None (Preliminary result)   Collection Time: 10/06/22  8:30 AM   Specimen: BLOOD  Result Value Ref Range Status   Specimen Description BLOOD RIGHT ANTECUBITAL  Final   Special Requests   Final    BOTTLES DRAWN AEROBIC AND ANAEROBIC Blood Culture results may not be optimal due to an excessive volume of blood received in culture bottles   Culture   Final    NO GROWTH 3 DAYS Performed at [facility], [place] [address], [place],  [address]    Report Status PENDING  Incomplete         Radiology Studies: IR Fluoro Guide Ndl Plmt / BX  Result Date: 10/09/2022 INDICATION: Low back pain secondary to osteomyelitis/discitis at L3-L4. EXAM: FLUOROSCOPIC GUIDED CORE BIOPSY AT L3 MEDICATIONS: None. ANESTHESIA/SEDATION: Moderate (conscious) sedation was employed during this procedure. A total of Versed 1 mg and Fentanyl 25 mcg was administered intravenously by the radiology nurse. Total intra-service moderate Sedation Time: 23 minutes. The patient's level of consciousness and vital signs were monitored continuously by radiology nursing throughout the procedure under my direct supervision. COMPLICATIONS:  None immediate. PROCEDURE: Informed written consent was obtained from the patient after a thorough discussion of the procedural risks, benefits and alternatives. All questions were addressed. Maximal Sterile Barrier Technique was utilized including caps, mask, sterile gowns, sterile gloves, sterile drape, hand hygiene and skin antiseptic. A timeout was performed prior to the initiation of the procedure. Patient was laid prone on the fluoroscopic table. Skin overlying the lumbar region was then prepped and draped in the usual manner. The right pedicle at L3 was then identified, and the skin  entry site was then infiltrated with 0.25% bupivacaine and carried to the pedicle itself. Under intermittent biplane fluoroscopy, a 13 gauge Cook spinal needle was then advanced to the posterior 1/3 at L3. Two passes were then made with a 16 gauge core biopsy needle. Biopsy material was then obtained with a 20 mL syringe and sent for analysis as per requesting MD. The 13 gauge Cook spinal needle was removed. Hemostasis was achieved at the skin entry site. Patient tolerated the procedure well. IMPRESSION: Status post fluoroscopic guided core biopsy at L3 as described. Electronically Signed   By: Luanne Bras M.D.   On: 10/09/2022 09:59        Scheduled Meds:  acetaminophen  1,000 mg Oral TID   insulin aspart  0-15 Units Subcutaneous TID WC   insulin aspart  0-5 Units Subcutaneous QHS   insulin glargine-yfgn  10 Units Subcutaneous QHS   lidocaine  1 patch Transdermal Q24H   metoprolol tartrate  25 mg Oral BID   polyethylene glycol  17 g Oral Daily   tamsulosin  0.4 mg Oral Daily   Continuous Infusions:  DAPTOmycin (CUBICIN) 600 mg in sodium chloride 0.9 % IVPB Stopped (10/08/22 2212)     LOS: 4 days    Time spent: 45 minutes spent on chart review, discussion with nursing staff, consultants, updating family and interview/physical exam; more than 50% of that time was spent in counseling and/or coordination  of care.    Geradine Girt, DO [facility] Available via Epic secure chat 7am-7pm After these hours, please refer to coverage provider listed on [email or web address] 10/09/2022, 11:44 AM

## 2022-10-09 NOTE — Progress Notes (Signed)
Bladder scan showed 532m.  Per order from Dr. VEliseo Squiresas described in her progress note, foley cath placed with return of 9025mclear yellow urine.  Pt was not in any discomfort prior to placing foley and tolerated foley insertion without difficulty.  Son at bedside and education on foley care and risk for infection discussed.  All questions answered. StAyesha MohairSN RN [facility]/06/2023, 11:19 PM

## 2022-10-09 NOTE — Progress Notes (Signed)
Palliative Medicine Inpatient Follow Up Note HPI: 89 year old gentleman with confusion and altered mental status multiple medical comorbidities possible encephalopathy with CT and MRI evidence of discitis osteomyelitis and a component of epidural abscess.    The palliative care team has been asked to get involved for further goals of care conversations.  Today's Discussion 10/09/2022  *Please note that this is a verbal dictation therefore any spelling or grammatical errors are due to the "[place]" system interpretation.  Chart reviewed inclusive of vital signs, progress notes, laboratory results, and diagnostic images.   I met this evening with Nicholas Burton. He was resting comfortably in his bed and was alert and oriented. His granddaughter was present. I shared the plan to meet with Nicholas Burton children which he was aware of.   I met with Marks son(s) Nicholas Burton., Nicholas Burton, and Nicholas Burton. His DIL's Nicholas Burton, and Nicholas Burton with present in addition to his wife, Nicholas Burton.  Open and honest conversation were held in the setting of patients present diease process. We reviewed that patient may have a malignancy in his spine as opposed to infection and/or both. Patients family review that this is what the medical team has shared with them. WE discussed until the biopsy results that it is unclear what we may be faced with.   We reviewed Nicholas Burton's decline over the past few weeks, the falls he has endured, and more recently his delirium/hallucinations causing his family to bring him to the ER. Discussed that his altered mental state has improved since being on antibiotics which is encouraging.   Patients family are concerns about his profound deconditioning and what they feel is worsening weakness. We discussed the idea of CIR vs. SNF. Family would be agreeable to CIR as this would optimize patient as best as possible from the perspective of his strength.   Discussed the need in the future for more help in  patients home for both he and his spouse. Patients family share that they plan to discuss this more in the oncoming days.  Discussed cardiopulmonary resuscitation status. Patient has ben clear about his desire to remain full code.  I shared that we can continue to address this moving forward so patient has a clear idea of what his quality of life would look like after such an event.  Patients family are open to OP Palliative support on discharge though they did not find services through [facility] helpful so we discussed alternative agencies.   Questions and concerns addressed/Palliative Support Provided.   Objective Assessment: Vital Signs Vitals:   10/09/22 0357 10/09/22 0855  BP: (!) 143/80 (!) 159/77  Pulse: (!) 102 99  Resp: 18 18  Temp: 97.7 F (36.5 C) 97.8 F (36.6 C)  SpO2: 100% 98%    Intake/Output Summary (Last 24 hours) at 10/09/2022 1658 Last data filed at 10/09/2022 0800 Gross per 24 hour  Intake 482.69 ml  Output 1400 ml  Net -917.31 ml    Last Weight  Most recent update: 10/07/2022  3:16 PM    Weight  77.1 kg (170 lb)            Gen: Elderly caucasian M in NAD HEENT: Dry mucous membranes CV: Regular rate and rhythm  PULM:  On RA, breathing is even and nonlabored ABD: soft/nontender  EXT: No edema  Neuro: Alert and oriented x3  SUMMARY OF RECOMMENDATIONS   FULL CODE/FULL SCOPE OF CARE   Biopsy pending to further guide additional conversations   Weakness: PT/OT --> Would  like to see if patient is a candidate for CIR   Pain in lower pack: Tylenol 1g TID and Lidoderm patch (on 12 hrs off 12 hrs), low dose Tramadol 76m PO Q12H PRN   TOC - OP Palliative support through [name]  Ongoing Palliative support  Total Time: 863Billing based on MDM: High ______________________________________________________________________________________ [facility] Team Cell Phone: [phone]  utilize secure chat with additional questions, if there is no response within 30 minutes please call the above phone number  Palliative Medicine Team providers are available by phone from 7am to 7pm daily and can be reached through the team cell phone.  Should this patient require assistance outside of these hours, please call the patient's attending physician.

## 2022-10-09 NOTE — Progress Notes (Signed)
   10/09/22 1052  Mobility  Activity Ambulated with assistance in hallway  Level of Assistance Contact guard assist, steadying assist  Assistive Device Front wheel walker  Distance Ambulated (ft) 150 ft  Activity Response Tolerated well  Mobility Referral Yes  $Mobility charge 1 Mobility   Mobility Specialist Progress Note  Pt in bed and agreeable. Had c/o back pain. Returned to chair w/ all needs met and call bell in reach.   Nicholas Burton Mobility Specialist  Please contact via SecureChat or Rehab office at [phone]

## 2022-10-09 NOTE — Progress Notes (Signed)
Pharmacy Antibiotic Note Nicholas Burton is a 89 y.o. male admitted on 10/05/2022 with discitis/osteo. S/p IR aspirate on 2/9. Pharmacy has been consulted for Daptomycin dosing. Day # 3.  Creatinine has trended up 1.22>1.93>2.17.  S/p I&O cath x 3, to place foley today. IR aspirate sent for analysis but not for culture.  Initial CK 79 on 2/10.  Plan: Daptomycin 600 mg (~8 mg/kg) IV q24h > to q48hrs. Follow renal function for further adjustment when renal function improves. CK weekly on Fridays.  Height: 5' 9"$  (175.3 cm) Weight: 77.1 kg (170 lb) IBW/kg (Calculated) : 70.7  Temp (24hrs), Avg:98 F (36.7 C), Min:97.7 F (36.5 C), Max:98.3 F (36.8 C)  Recent Labs  Lab 10/05/22 2022 10/06/22 0216 10/07/22 0326 10/08/22 0426 10/09/22 0341  WBC 13.8* 12.2* 15.1* 13.4* 10.8*  CREATININE 1.35* 1.22  --  1.93* 2.17*    Estimated Creatinine Clearance: 21.7 mL/min (A) (by C-G formula based on SCr of 2.17 mg/dL (H)).    Allergies  Allergen Reactions   Ace Inhibitors Cough   Oxycodone Itching and Rash   Penicillins Itching and Rash    Antimicrobials this admission: Daptomycin 2/9>>  Dose adjustments this admission: 2/11: dosing interval q24 > q48h for current renal function  Microbiology results: 2/8 blood: no growth x 3 days to date 2/7 COVID, flu and RSV: negative  Thank you for allowing pharmacy to be a part of this patient's care.  Arty Baumgartner, [place] 10/09/2022 12:00 PM

## 2022-10-10 DIAGNOSIS — R9389 Abnormal findings on diagnostic imaging of other specified body structures: Secondary | ICD-10-CM | POA: Diagnosis not present

## 2022-10-10 DIAGNOSIS — Z515 Encounter for palliative care: Secondary | ICD-10-CM | POA: Diagnosis not present

## 2022-10-10 DIAGNOSIS — Z7189 Other specified counseling: Secondary | ICD-10-CM | POA: Diagnosis not present

## 2022-10-10 DIAGNOSIS — M545 Low back pain, unspecified: Secondary | ICD-10-CM | POA: Diagnosis not present

## 2022-10-10 DIAGNOSIS — M4646 Discitis, unspecified, lumbar region: Secondary | ICD-10-CM | POA: Diagnosis not present

## 2022-10-10 DIAGNOSIS — F0390 Unspecified dementia without behavioral disturbance: Secondary | ICD-10-CM | POA: Diagnosis not present

## 2022-10-10 LAB — CBC
HCT: 33.1 % — ABNORMAL LOW (ref 39.0–52.0)
Hemoglobin: 10.7 g/dL — ABNORMAL LOW (ref 13.0–17.0)
MCH: 28.8 pg (ref 26.0–34.0)
MCHC: 32.3 g/dL (ref 30.0–36.0)
MCV: 89.2 fL (ref 80.0–100.0)
Platelets: 224 10*3/uL (ref 150–400)
RBC: 3.71 MIL/uL — ABNORMAL LOW (ref 4.22–5.81)
RDW: 15.3 % (ref 11.5–15.5)
WBC: 10.6 10*3/uL — ABNORMAL HIGH (ref 4.0–10.5)
nRBC: 0 % (ref 0.0–0.2)

## 2022-10-10 LAB — BASIC METABOLIC PANEL
Anion gap: 9 (ref 5–15)
BUN: 24 mg/dL — ABNORMAL HIGH (ref 8–23)
CO2: 22 mmol/L (ref 22–32)
Calcium: 8 mg/dL — ABNORMAL LOW (ref 8.9–10.3)
Chloride: 106 mmol/L (ref 98–111)
Creatinine, Ser: 1.81 mg/dL — ABNORMAL HIGH (ref 0.61–1.24)
GFR, Estimated: 35 mL/min — ABNORMAL LOW (ref >60)
Glucose, Bld: 215 mg/dL — ABNORMAL HIGH (ref 70–99)
Potassium: 3.5 mmol/L (ref 3.5–5.1)
Sodium: 137 mmol/L (ref 135–145)

## 2022-10-10 LAB — GLUCOSE, CAPILLARY
Glucose-Capillary: 190 mg/dL — ABNORMAL HIGH (ref 70–99)
Glucose-Capillary: 266 mg/dL — ABNORMAL HIGH (ref 70–99)
Glucose-Capillary: 272 mg/dL — ABNORMAL HIGH (ref 70–99)
Glucose-Capillary: 105 mg/dL — ABNORMAL HIGH (ref 70–99)

## 2022-10-10 MED ORDER — INSULIN ASPART 100 UNIT/ML IJ SOLN
3.0000 [IU] | Freq: Three times a day (TID) | INTRAMUSCULAR | Status: DC
  Administered 2022-10-10 – 2022-10-14 (×10): 3 [IU] via SUBCUTANEOUS

## 2022-10-10 MED ORDER — CHLORHEXIDINE GLUCONATE CLOTH 2 % EX PADS
6.0000 | MEDICATED_PAD | Freq: Every day | CUTANEOUS | Status: DC
  Administered 2022-10-10 – 2022-10-14 (×5): 6 via TOPICAL

## 2022-10-10 MED ORDER — INSULIN GLARGINE-YFGN 100 UNIT/ML ~~LOC~~ SOLN
15.0000 [IU] | Freq: Every day | SUBCUTANEOUS | Status: DC
  Administered 2022-10-10 – 2022-10-13 (×4): 15 [IU] via SUBCUTANEOUS
  Filled 2022-10-10 (×5): qty 0.15

## 2022-10-10 NOTE — Progress Notes (Signed)
OT Cancellation Note  Patient Details Name: Nicholas Burton MRN: [id number]:[id number] DOB: [birth year]   Cancelled Treatment:    Reason Eval/Treat Not Completed: Patient at procedure or test/ unavailable (pt with palliative medicine) Will continue efforts.   [name] 10/10/2022, 3:12 PM Cleta Alberts, OTR/L [place] Office: [phone]

## 2022-10-10 NOTE — Inpatient Diabetes Management (Signed)
Inpatient Diabetes Program Recommendations  AACE/ADA: New Consensus Statement on Inpatient Glycemic Control (2015)  Target Ranges:  Prepandial:   less than 140 mg/dL      Peak postprandial:   less than 180 mg/dL (1-2 hours)      Critically ill patients:  140 - 180 mg/dL   Lab Results  Component Value Date   GLUCAP 190 (H) 10/10/2022   HGBA1C 8.0 (H) 07/26/2022    Latest Reference Range & Units 10/09/22 08:57 10/09/22 11:36 10/09/22 16:28 10/09/22 21:42 10/10/22 07:56  Glucose-Capillary 70 - 99 mg/dL 191 (H) 335 (H) 224 (H) 340 (H) 190 (H)  (H): Data is abnormally high  Diabetes history: DM2 Outpatient Diabetes medications: Lantus 30 QHS, Jardiance 10 QAM, Metformin 500 BID  Current orders for Inpatient glycemic control: Semglee 13 units QHS, Novolog correction 0-15 TID, 0-5 HS   Inpatient Diabetes Program Recommendations:   Please consider: -Increase Semglee to 15 units q hs -Add Novolog 3 units tid meal coverage if eats 50% meal  Will follow while inpatient.  Thank you, Nani Gasser. Welles Walthall, RN, MSN, CDE  Diabetes Coordinator Inpatient Glycemic Control Team Team Pager [phone] (8am-5pm) 10/10/2022 9:42 AM

## 2022-10-10 NOTE — Progress Notes (Addendum)
PROGRESS NOTE    LISTER CAPELLE  [id number] DOB: [birth year] DOA: 10/05/2022 PCP: [name], MD    Brief Narrative:  Nicholas Burton is a 89 y.o. male with medical history significant for dementia, type 2 diabetes mellitus, chronic diastolic heart failure, essential hypertension, hyperlipidemia, who is admitted to [facility] on 10/05/2022 with acute encephalopathy after presenting from home to [facility] ED for evaluation of confusion.   He has recently, over the last 2 weeks, been complaining of new onset low back pain as well as new onset left lower extremity weakness, without any reported sensory changes, including no report of associated saddle anesthesia. No report of any urinary changes, including no report of acute urinary retention nor any report of fecal incontinence. No report of preceding trauma.   S/p biopsy-- asked for cytology to be sent as well as culture.   Assessment and Plan:  acute encephalopathy:  -1 to 2 days of confusion, with underlying etiology not entirely clear at this time.  Differential includes potential infectious process in the context of possible discitis versus osteomyelitis in involving the lumbar spine, as further detailed below. -delirium precautions  -seems to wax and wane    Lytic destructive pattern of the lumbar vertebral bodies involving L2-L4: As further detailed above, with underlying etiology not entirely clear, but with current differential including osteomyelitis versus discitis versus pathological fracture from malignancy.  -seen by NS: reluctant to recommend any surgery at this time discitis osteomyelitis should continue with medical workup to include blood cultures urine cultures laboratory markers and see if we can identify bacteria. Would certainly consult infectious disease and start the patient on empiric antibiotics once cultures are obtained consideration certainly could be given to a CT aspiration although there is no frank fluid  collection to target with that. At this point I think surgery is a high risk for him and consider maximizing medical management.  -ID consulted- recommend biopsy-- has not been on abx yet -palliative care consult for GOC-- family would like to pursue biopsy  -IR consult for biopsy- done 2/9- culture and cytology-- appears only cytology sent-- await results    Hypo-kalemia: Presenting to potassium level mildly low at 3.3. -repleted   Type 2 Diabetes Mellitus:  - Home insulin regimen: Lantus 30 units subcu nightly. Home oral hypoglycemic agents: Metformin, apical fluids and. presenting blood sugar: 215. Most recent A1c noted to be 8.0 when checked in November 2023.  -SSI, semglee- increase dose    Chronic diastolic heart failure:  -documented history of such, with most recent echocardiogram performed in August 2020, which was notable for LVEF 60 to 65%, left ventricular diastolic parameters consistent with impaired relaxation, normal right ventricular systolic function.. No clinical or radiographic evidence to suggest acutely decompensated heart failure at this time     Essential Hypertension: -resume home meds as needed   AKI-appears to be due to acute urinary retention  Foley placed -avoid nephrotoxic meds -avoid hypotension  -bowel regimen   Hyperlipidemia  -resume home med upon d/c   ? Alcohol use -monitor for withdrawal    PT/OT eval   DVT prophylaxis: SCDs Start: 10/05/22 2322    Code Status: Full Code Family Communication: son at bedside 2/12  Disposition Plan:  Level of care: Med-Surg Status is: Inpatient Remains inpatient appropriate because: await plan for d/c and cytology    Consultants:  IR ID NS Palliative care   Subjective: Foley had to be placed last night  Objective: Vitals:  10/09/22 2039 10/10/22 0500 10/10/22 0521 10/10/22 0753  BP: 134/83  (!) 152/80 (!) 161/74  Pulse: 91  88 91  Resp: 18  18 18  $ Temp: 98.2 F (36.8 C)  (!) 97.5 F  (36.4 C) (!) 97.4 F (36.3 C)  TempSrc: Oral  Oral Oral  SpO2: 100%  100% 99%  Weight:  76.4 kg    Height:        Intake/Output Summary (Last 24 hours) at 10/10/2022 1301 Last data filed at 10/10/2022 [id number] Gross per 24 hour  Intake 480 ml  Output 1600 ml  Net -1120 ml   Filed Weights   10/07/22 1505 10/10/22 0500  Weight: 77.1 kg 76.4 kg    Examination:     General: Appearance:    Well developed, well nourished male in no acute distress     Lungs:     respirations unlabored  Heart:    Normal heart rate.  MS:   All extremities are intact.   Neurologic:   Awake, alert,       Data Reviewed: I have personally reviewed following labs and imaging studies  CBC: Recent Labs  Lab 10/05/22 2022 10/05/22 2032 10/06/22 0216 10/07/22 0326 10/08/22 0426 10/09/22 0341 10/10/22 0341  WBC 13.8*  --  12.2* 15.1* 13.4* 10.8* 10.6*  NEUTROABS 8.4*  --  7.7  --   --   --   --   HGB 12.9*   < > 12.5* 12.7* 11.9* 10.8* 10.7*  HCT 39.1   < > 36.6* 39.0 36.7* 32.3* 33.1*  MCV 88.3  --  86.1 89.0 89.5 88.7 89.2  PLT 201  --  182 194 210 191 224   < > = values in this interval not displayed.   Basic Metabolic Panel: Recent Labs  Lab 10/05/22 2022 10/05/22 2032 10/06/22 0216 10/08/22 0426 10/09/22 0341 10/10/22 0341  NA 136 138 137 138 138 137  K 3.3* 3.3* 3.3* 3.6 3.3* 3.5  CL 104  --  104 105 105 106  CO2 22  --  22 22 24 22  $ GLUCOSE 215*  --  187* 163* 111* 215*  BUN 15  --  13 21 24* 24*  CREATININE 1.35*  --  1.22 1.93* 2.17* 1.81*  CALCIUM 8.8*  --  8.7* 8.4* 8.1* 8.0*  MG 2.2  --  2.0  --   --   --    GFR: Estimated Creatinine Clearance: 26 mL/min (A) (by C-G formula based on SCr of 1.81 mg/dL (H)). Liver Function Tests: Recent Labs  Lab 10/05/22 2022 10/06/22 0216  AST 24 23  ALT 21 19  ALKPHOS 135* 130*  BILITOT 0.8 0.8  PROT 5.9* 5.4*  ALBUMIN 3.3* 3.0*   Recent Labs  Lab 10/05/22 2022  LIPASE 30   Recent Labs  Lab 10/05/22 2022  AMMONIA  <10   Coagulation Profile: Recent Labs  Lab 10/06/22 0216 10/07/22 0326  INR 1.1 1.1   Cardiac Enzymes: Recent Labs  Lab 10/08/22 0426  CKTOTAL 79   BNP (last 3 results) Recent Labs    07/26/22 1406  PROBNP 59.0   HbA1C: No results for input(s): "HGBA1C" in the last 72 hours. CBG: Recent Labs  Lab 10/09/22 1136 10/09/22 1628 10/09/22 2142 10/10/22 0756 10/10/22 1129  GLUCAP 335* 224* 340* 190* 266*   Lipid Profile: No results for input(s): "CHOL", "HDL", "LDLCALC", "TRIG", "CHOLHDL", "LDLDIRECT" in the last 72 hours. Thyroid Function Tests: No results for input(s): "TSH", "T4TOTAL", "FREET4", "T3FREE", "  THYROIDAB" in the last 72 hours.  Anemia Panel: No results for input(s): "VITAMINB12", "FOLATE", "FERRITIN", "TIBC", "IRON", "RETICCTPCT" in the last 72 hours. Sepsis Labs: Recent Labs  Lab 10/06/22 0204  PROCALCITON <0.10    Recent Results (from the past 240 hour(s))  Resp panel by RT-PCR (RSV, Flu A&B, Covid) Anterior Nasal Swab     Status: None   Collection Time: 10/05/22 10:18 PM   Specimen: Anterior Nasal Swab  Result Value Ref Range Status   SARS Coronavirus 2 by RT PCR NEGATIVE NEGATIVE Final   Influenza A by PCR NEGATIVE NEGATIVE Final   Influenza B by PCR NEGATIVE NEGATIVE Final    Comment: (NOTE) The Xpert Xpress SARS-CoV-2/FLU/RSV plus assay is intended as an aid in the diagnosis of influenza from Nasopharyngeal swab specimens and should not be used as a sole basis for treatment. Nasal washings and aspirates are unacceptable for Xpert Xpress SARS-CoV-2/FLU/RSV testing.  Fact Sheet for Patients: EntrepreneurPulse.com.au  Fact Sheet for Healthcare Providers: IncredibleEmployment.be  This test is not yet approved or cleared by the [place] FDA and has been authorized for detection and/or diagnosis of SARS-CoV-2 by FDA under an Emergency Use Authorization (EUA). This EUA will remain in effect (meaning  this test can be used) for the duration of the COVID-19 declaration under Section 564(b)(1) of the Act, 21 U.S.C. section 360bbb-3(b)(1), unless the authorization is terminated or revoked.     Resp Syncytial Virus by PCR NEGATIVE NEGATIVE Final    Comment: (NOTE) Fact Sheet for Patients: EntrepreneurPulse.com.au  Fact Sheet for Healthcare Providers: IncredibleEmployment.be  This test is not yet approved or cleared by the [place] FDA and has been authorized for detection and/or diagnosis of SARS-CoV-2 by FDA under an Emergency Use Authorization (EUA). This EUA will remain in effect (meaning this test can be used) for the duration of the COVID-19 declaration under Section 564(b)(1) of the Act, 21 U.S.C. section 360bbb-3(b)(1), unless the authorization is terminated or revoked.  Performed at [facility], [place] [address], [place], [place] [address]   Culture, blood (single) w Reflex to ID Panel     Status: None (Preliminary result)   Collection Time: 10/06/22  8:30 AM   Specimen: BLOOD  Result Value Ref Range Status   Specimen Description BLOOD RIGHT ANTECUBITAL  Final   Special Requests   Final    BOTTLES DRAWN AEROBIC AND ANAEROBIC Blood Culture results may not be optimal due to an excessive volume of blood received in culture bottles   Culture   Final    NO GROWTH 4 DAYS Performed at [facility], [place] [address] [place], [place] [address]    Report Status PENDING  Incomplete         Radiology Studies: No results found.      Scheduled Meds:  acetaminophen  1,000 mg Oral TID   Chlorhexidine Gluconate Cloth  6 each Topical Daily   insulin aspart  0-15 Units Subcutaneous TID WC   insulin aspart  0-5 Units Subcutaneous QHS   insulin aspart  3 Units Subcutaneous TID WC   insulin glargine-yfgn  15 Units Subcutaneous QHS   lidocaine  1 patch Transdermal Q24H   metoprolol tartrate  25 mg Oral BID   polyethylene  glycol  17 g Oral Daily   tamsulosin  0.4 mg Oral Daily   Continuous Infusions:  DAPTOmycin (CUBICIN) 600 mg in sodium chloride 0.9 % IVPB       LOS: 5 days  Time spent: 45 minutes spent on chart review, discussion with nursing staff, consultants, updating family and interview/physical exam; more than 50% of that time was spent in counseling and/or coordination of care.    Geradine Girt, DO [facility] Available via Epic secure chat 7am-7pm After these hours, please refer to coverage provider listed on [email or web address] 10/10/2022, 1:01 PM

## 2022-10-10 NOTE — Care Management Important Message (Signed)
Important Message  Patient Details  Name: Nicholas Burton MRN: [id number] Date of Birth: 16-Oct-1927   Medicare Important Message Given:  Yes     [name] [address]ine Circle 10/10/2022, 3:05 PM

## 2022-10-10 NOTE — Progress Notes (Signed)
PT Cancellation Note  Patient Details Name: Nicholas Burton MRN: [id number] DOB: [birth year]   Cancelled Treatment:    Reason Eval/Treat Not Completed: Fatigue/lethargy limiting ability to participate. Pt sleeping when PT arrived. Family present and reports pt has been exhausted today and just fell asleep. Opted not to wake pt. Discussed current level of function with family and rehab options at d/c. Son asks that PT return tomorrow morning to work with pt. Will continue to follow.    Thelma Comp 10/10/2022, 3:47 PM  Rolinda Roan, PT, DPT Acute Rehabilitation Services Secure Chat Preferred Office: [phone]

## 2022-10-10 NOTE — Progress Notes (Signed)
[place] for Infectious Disease   Reason for visit: Follow up on lumbar lytic lesions  Interval History: pathology pending from biopsy, no cultures sent. Son at bedside.    Day 4 daptomycin  Physical Exam: Constitutional:  Vitals:   10/10/22 0753 10/10/22 1357  BP: (!) 161/74 (!) 149/72  Pulse: 91 79  Resp: 18 18  Temp: (!) 97.4 F (36.3 C) 97.8 F (36.6 C)  SpO2: 99% 100%   patient appears in NAD, sleeping Respiratory: Normal respiratory effort; CTA B   Review of Systems: Constitutional: negative for fevers and chills  Lab Results  Component Value Date   WBC 10.6 (H) 10/10/2022   HGB 10.7 (L) 10/10/2022   HCT 33.1 (L) 10/10/2022   MCV 89.2 10/10/2022   PLT 224 10/10/2022    Lab Results  Component Value Date   CREATININE 1.81 (H) 10/10/2022   BUN 24 (H) 10/10/2022   NA 137 10/10/2022   K 3.5 10/10/2022   CL 106 10/10/2022   CO2 22 10/10/2022    Lab Results  Component Value Date   ALT 19 10/06/2022   AST 23 10/06/2022   ALKPHOS 130 (H) 10/06/2022     Microbiology: Recent Results (from the past 240 hour(s))  Resp panel by RT-PCR (RSV, Flu A&B, Covid) Anterior Nasal Swab     Status: None   Collection Time: 10/05/22 10:18 PM   Specimen: Anterior Nasal Swab  Result Value Ref Range Status   SARS Coronavirus 2 by RT PCR NEGATIVE NEGATIVE Final   Influenza A by PCR NEGATIVE NEGATIVE Final   Influenza B by PCR NEGATIVE NEGATIVE Final    Comment: (NOTE) The Xpert Xpress SARS-CoV-2/FLU/RSV plus assay is intended as an aid in the diagnosis of influenza from Nasopharyngeal swab specimens and should not be used as a sole basis for treatment. Nasal washings and aspirates are unacceptable for Xpert Xpress SARS-CoV-2/FLU/RSV testing.  Fact Sheet for Patients: EntrepreneurPulse.com.au  Fact Sheet for Healthcare Providers: IncredibleEmployment.be  This test is not yet approved or cleared by the [place] FDA  and has been authorized for detection and/or diagnosis of SARS-CoV-2 by FDA under an Emergency Use Authorization (EUA). This EUA will remain in effect (meaning this test can be used) for the duration of the COVID-19 declaration under Section 564(b)(1) of the Act, 21 U.S.C. section 360bbb-3(b)(1), unless the authorization is terminated or revoked.     Resp Syncytial Virus by PCR NEGATIVE NEGATIVE Final    Comment: (NOTE) Fact Sheet for Patients: EntrepreneurPulse.com.au  Fact Sheet for Healthcare Providers: IncredibleEmployment.be  This test is not yet approved or cleared by the [place] FDA and has been authorized for detection and/or diagnosis of SARS-CoV-2 by FDA under an Emergency Use Authorization (EUA). This EUA will remain in effect (meaning this test can be used) for the duration of the COVID-19 declaration under Section 564(b)(1) of the Act, 21 U.S.C. section 360bbb-3(b)(1), unless the authorization is terminated or revoked.  Performed at [facility], [place] [address] [place],  [address]   Culture, blood (single) w Reflex to ID Panel     Status: None (Preliminary result)   Collection Time: 10/06/22  8:30 AM   Specimen: BLOOD  Result Value Ref Range Status   Specimen Description BLOOD RIGHT ANTECUBITAL  Final   Special Requests   Final    BOTTLES DRAWN AEROBIC AND ANAEROBIC Blood Culture results may not be optimal due to an excessive volume of blood received in culture bottles  Culture   Final    NO GROWTH 4 DAYS Performed at [facility], [place] [address] [place], [place] [address]    Report Status PENDING  Incomplete    Impression/Plan:  1. Lytic destruction of lumbar region - infection vs tumor.  Pathology pending.  If positive for cancerous process, will stop antibiotics.  If negative, I would not consider it ruled out but likely will proceed with antibiotics and monitor with repeat MRI after 2-3 weeks.    Continue with daptomycin  2.  Confusion - son reports improvement in his confusion.  Unclear etiology.   Will continue to monitor  3.  Diabetes - poor control with A1c in NOvember of 8.0.  will need to improve sugar control.    Will follow up again after pathology results.

## 2022-10-10 NOTE — Progress Notes (Signed)
Daily Progress Note   Patient Name: Nicholas Burton       Date: 10/10/2022 DOB: 11-Aug-1928  Age: 89 y.o. MRN#: [id number] Attending Physician: Geradine Girt, DO Primary Care Physician: [name], MD Admit Date: 10/05/2022 Length of Stay: 5 days  Reason for Consultation/Follow-up: Establishing goals of care  HPI/Patient Profile:  89 year old gentleman with confusion and altered mental status multiple medical comorbidities possible encephalopathy with CT and MRI evidence of discitis osteomyelitis and a component of epidural abscess.    The palliative care team has been asked to get involved for further goals of care conversations.  Subjective:   Subjective: Chart Reviewed. Updates received. Patient Assessed. Created space and opportunity for patient  and family to explore thoughts and feelings regarding current medical situation.  Today's Discussion: Today met with the patient at the bedside.  Also present was his wife and granddaughter.  We reviewed previous palliative conversations including possible malignancy versus infection versus both in his spine status post biopsy.  Unfortunately, the culture aspect was not sent.  However, we are still waiting for the pathology results related to possible malignancy.  We also reviewed their assertion of his recent decline with falls and confusion, with some improvement recently on antibiotics which they feel is encouraging.  Family is concerned about his deconditioning and weakness and they are on board with rehab of some sort, either CIR versus SNF.  We also reviewed desire for full code and full scope of treatment at this time.  However, they are agreeable to outpatient palliative care.  We discussed that there are not many other decisions to be made until we have the pathology back.  We discussed the need to know what were dealing with before knowing what options they are are and which those options are agreeable with the patient and family.  He  is still having some back pain.  He is not sure if Tylenol and lidocaine patch are helping.  I offered that he has have tramadol available as needed.  He is adamant he does not want hydrocodone.  I explained that Ultram is not hydrocodone and is generally less intense.  We discussed things that bring him joy including his family.  He has a large and supportive family.  At least one of the patient's son is planning to spend the night tonight and be here tomorrow.  We discussed palliative will continue to follow as we wait for biopsy results and then further discussions about goals of care.  They are agreeable.  I provided emotional and general support through therapeutic listening, empathy, sharing of stories, and other techniques. I answered all questions and addressed all concerns to the best of my ability.   Review of Systems  Respiratory:  Negative for cough and shortness of breath.   Cardiovascular:  Negative for chest pain.  Gastrointestinal:  Negative for abdominal pain, nausea and vomiting.  Musculoskeletal:  Positive for back pain.    Objective:   Vital Signs:  BP (!) 149/72 (BP Location: Right Arm)   Pulse 79   Temp 97.8 F (36.6 C) (Oral)   Resp 18   Ht 5' 9"$  (1.753 m)   Wt 76.4 kg   SpO2 100%   BMI 24.87 kg/m   Physical Exam: Physical Exam Vitals and nursing note reviewed.  Constitutional:      General: He is not in acute distress.    Appearance: He is ill-appearing.  HENT:     Head: Normocephalic and atraumatic.  Ears:     Comments: Hard of hearing Cardiovascular:     Rate and Rhythm: Normal rate.  Pulmonary:     Effort: Pulmonary effort is normal. No respiratory distress.  Abdominal:     General: Abdomen is flat.  Skin:    General: Skin is warm and dry.  Neurological:     General: No focal deficit present.     Mental Status: He is alert.  Psychiatric:        Mood and Affect: Mood normal.        Behavior: Behavior normal.     Palliative  Assessment/Data: 50%    Existing Vynca/ACP Documentation: None  Assessment & Plan:   Impression: Present on Admission:  Acute encephalopathy  Lytic lesion of bone on x-ray  Hypokalemia  SIRS (systemic inflammatory response syndrome) (HCC)  Chronic diastolic CHF (congestive heart failure) (HCC)  Essential hypertension  HLD (hyperlipidemia)  Diskitis  SUMMARY OF RECOMMENDATIONS   Remain full code Full scope of care Await biopsy results to guide additional discussions Await PT/OT recommendations for rehab level Encouraged patient to ask for as needed Ultram for persistent back pain Recommended outpatient palliative care with [facility] [place] PMT will continue to follow  Symptom Management:  Tylenol 1 g 3 times daily Lidoderm patch (on 12 hours, off 12 hours) Ultram 25 mg p.o. every 12 hours as needed  Code Status: Full code  Prognosis: Unable to determine  Discharge Planning: To Be Determined  Discussed with: Patient, patient's family, medical team, nursing team  Thank you for allowing [place] to participate in the care of MIZRAIM GANSON PMT will continue to support holistically.  Time Total: 55 min  Visit consisted of counseling and education dealing with the complex and emotionally intense issues of symptom management and palliative care in the setting of serious and potentially life-threatening illness. Greater than 50%  of this time was spent counseling and coordinating care related to the above assessment and plan.  Walden Field, NP Palliative Medicine Team  Team Phone # [phone] (Nights/Weekends)  04/27/2021, 8:17 AM

## 2022-10-11 ENCOUNTER — Inpatient Hospital Stay (HOSPITAL_COMMUNITY): Payer: Medicare Other

## 2022-10-11 DIAGNOSIS — Z7189 Other specified counseling: Secondary | ICD-10-CM | POA: Diagnosis not present

## 2022-10-11 DIAGNOSIS — Z515 Encounter for palliative care: Secondary | ICD-10-CM | POA: Diagnosis not present

## 2022-10-11 DIAGNOSIS — F0390 Unspecified dementia without behavioral disturbance: Secondary | ICD-10-CM | POA: Diagnosis not present

## 2022-10-11 DIAGNOSIS — M545 Low back pain, unspecified: Secondary | ICD-10-CM | POA: Diagnosis not present

## 2022-10-11 DIAGNOSIS — R9389 Abnormal findings on diagnostic imaging of other specified body structures: Secondary | ICD-10-CM | POA: Diagnosis not present

## 2022-10-11 LAB — BASIC METABOLIC PANEL
Anion gap: 9 (ref 5–15)
BUN: 16 mg/dL (ref 8–23)
CO2: 25 mmol/L (ref 22–32)
Calcium: 8.2 mg/dL — ABNORMAL LOW (ref 8.9–10.3)
Chloride: 104 mmol/L (ref 98–111)
Creatinine, Ser: 1.44 mg/dL — ABNORMAL HIGH (ref 0.61–1.24)
GFR, Estimated: 46 mL/min — ABNORMAL LOW (ref >60)
Glucose, Bld: 96 mg/dL (ref 70–99)
Potassium: 3.5 mmol/L (ref 3.5–5.1)
Sodium: 138 mmol/L (ref 135–145)

## 2022-10-11 LAB — CULTURE, BLOOD (SINGLE): Culture: NO GROWTH

## 2022-10-11 LAB — GLUCOSE, CAPILLARY
Glucose-Capillary: 224 mg/dL — ABNORMAL HIGH (ref 70–99)
Glucose-Capillary: 161 mg/dL — ABNORMAL HIGH (ref 70–99)
Glucose-Capillary: 228 mg/dL — ABNORMAL HIGH (ref 70–99)
Glucose-Capillary: 188 mg/dL — ABNORMAL HIGH (ref 70–99)

## 2022-10-11 LAB — SURGICAL PATHOLOGY

## 2022-10-11 MED ORDER — SODIUM CHLORIDE 0.9 % IV SOLN
8.0000 mg/kg | Freq: Every day | INTRAVENOUS | Status: DC
  Filled 2022-10-11: qty 12

## 2022-10-11 NOTE — Progress Notes (Addendum)
PROGRESS NOTE    Nicholas Burton  [id number] DOB: 18-Nov-1927 DOA: 10/05/2022 PCP: [name], MD    Brief Narrative:  Nicholas Burton is a 89 y.o. male with medical history significant for dementia, type 2 diabetes mellitus, chronic diastolic heart failure, essential hypertension, hyperlipidemia, who is admitted to [facility] on 10/05/2022 with acute encephalopathy after presenting from home to [facility] ED for evaluation of confusion.   He has recently, over the last 2 weeks, been complaining of new onset low back pain as well as new onset left lower extremity weakness, without any reported sensory changes, including no report of associated saddle anesthesia. No report of any urinary changes, including no report of acute urinary retention nor any report of fecal incontinence. No report of preceding trauma.   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 Irene Limbo to see-- PSA ordered, CT chest/abd/pelvis ordered.  Updated sons at bedside (patient was sleeping and they asked to be allowed to update him)   Assessment and Plan:  acute encephalopathy:  -1 to 2 days of confusion at home, with underlying etiology not entirely clear at this time.  Differential includes potential infectious process in the context of possible discitis versus osteomyelitis  involving the lumbar spine, -delirium precautions  -seems to wax and wane- is hard of hearing so that may play a role    Lytic destructive pattern of the lumbar vertebral bodies involving L2-L4: As further detailed above, with underlying etiology not entirely clear, but with current differential including osteomyelitis versus discitis versus pathological fracture from malignancy.  -seen by NS: reluctant to recommend any surgery due to  high risk for him. maximizing medical management.  -ID consulted -palliative  care consult for GOC -IR consult for biopsy- done 2/9- cytology with metastatic carcinoma-- likely prostate-- no need for abx per ID    Hypo-kalemia: Presenting to potassium level mildly low at 3.3. -repleted   Type 2 Diabetes Mellitus:  - Home insulin regimen: Lantus 30 units subcu nightly. Home oral hypoglycemic agents: Metformin, apical fluids and. presenting blood sugar: 215. Most recent A1c noted to be 8.0 when checked in November 2023.  -SSI, semglee- increase dose for better control    Chronic diastolic heart failure:  -documented history of such, with most recent echocardiogram performed in August 2020, which was notable for LVEF 60 to 65%, left ventricular diastolic parameters consistent with impaired relaxation, normal right ventricular systolic function.. No clinical or radiographic evidence to suggest acutely decompensated heart failure at this time    Essential Hypertension: -resume home meds as needed   AKI-appears to be due to acute urinary retention  Foley placed -avoid nephrotoxic meds -avoid hypotension  -bowel regimen -will need outpatient urology follow up   Hyperlipidemia  -resume home med upon d/c      PT/OT eval- pending 2/13-- home health recommended prior   DVT prophylaxis: SCDs Start: 10/05/22 2322    Code Status: Full Code Family Communication: son at bedside 2/13 (has 4 sons)  Disposition Plan:  Level of care: Med-Surg Status is: Inpatient Remains inpatient appropriate because: await plan for d/c and cytology    Consultants:  IR ID NS Palliative care oncology   Subjective: Needs to have BM  Objective: Vitals:   10/10/22 1357 10/10/22 2056 10/11/22 0500 10/11/22 0730  BP: (!) 149/72 (!) 142/74  135/75  Pulse: 79  87  98  Resp: 18 20  17  $ Temp: 97.8 F (36.6 C) 97.9 F (36.6 C)  98.4 F (36.9 C)  TempSrc: Oral Oral  Oral  SpO2: 100% 99%  91%  Weight:   77.1 kg   Height:        Intake/Output Summary (Last 24 hours) at  10/11/2022 0850 Last data filed at 10/11/2022 [id number] Gross per 24 hour  Intake 62 ml  Output 1800 ml  Net -1738 ml   Filed Weights   10/07/22 1505 10/10/22 0500 10/11/22 0500  Weight: 77.1 kg 76.4 kg 77.1 kg    Examination:     General: Appearance:    Hard of hearing male in no acute distress     Lungs:     respirations unlabored  Heart:    Normal heart rate.  MS:   All extremities are intact.   Neurologic:   Awake, alert- very hard of hearing       Data Reviewed: I have personally reviewed following labs and imaging studies  CBC: Recent Labs  Lab 10/05/22 2022 10/05/22 2032 10/06/22 0216 10/07/22 0326 10/08/22 0426 10/09/22 0341 10/10/22 0341  WBC 13.8*  --  12.2* 15.1* 13.4* 10.8* 10.6*  NEUTROABS 8.4*  --  7.7  --   --   --   --   HGB 12.9*   < > 12.5* 12.7* 11.9* 10.8* 10.7*  HCT 39.1   < > 36.6* 39.0 36.7* 32.3* 33.1*  MCV 88.3  --  86.1 89.0 89.5 88.7 89.2  PLT 201  --  182 194 210 191 224   < > = values in this interval not displayed.   Basic Metabolic Panel: Recent Labs  Lab 10/05/22 2022 10/05/22 2032 10/06/22 0216 10/08/22 0426 10/09/22 0341 10/10/22 0341 10/11/22 0258  NA 136   < > 137 138 138 137 138  K 3.3*   < > 3.3* 3.6 3.3* 3.5 3.5  CL 104  --  104 105 105 106 104  CO2 22  --  22 22 24 22 25  $ GLUCOSE 215*  --  187* 163* 111* 215* 96  BUN 15  --  13 21 24* 24* 16  CREATININE 1.35*  --  1.22 1.93* 2.17* 1.81* 1.44*  CALCIUM 8.8*  --  8.7* 8.4* 8.1* 8.0* 8.2*  MG 2.2  --  2.0  --   --   --   --    < > = values in this interval not displayed.   GFR: Estimated Creatinine Clearance: 32.7 mL/min (A) (by C-G formula based on SCr of 1.44 mg/dL (H)). Liver Function Tests: Recent Labs  Lab 10/05/22 2022 10/06/22 0216  AST 24 23  ALT 21 19  ALKPHOS 135* 130*  BILITOT 0.8 0.8  PROT 5.9* 5.4*  ALBUMIN 3.3* 3.0*   Recent Labs  Lab 10/05/22 2022  LIPASE 30   Recent Labs  Lab 10/05/22 2022  AMMONIA <10   Coagulation  Profile: Recent Labs  Lab 10/06/22 0216 10/07/22 0326  INR 1.1 1.1   Cardiac Enzymes: Recent Labs  Lab 10/08/22 0426  CKTOTAL 79   BNP (last 3 results) Recent Labs    07/26/22 1406  PROBNP 59.0   HbA1C: No results for input(s): "HGBA1C" in the last 72 hours. CBG: Recent Labs  Lab 10/10/22 0756 10/10/22 1129 10/10/22 1625 10/10/22 2124 10/11/22 0727  GLUCAP 190* 266* 272* 105* 224*   Lipid Profile: No results for input(s): "CHOL", "HDL", "LDLCALC", "TRIG", "CHOLHDL", "LDLDIRECT"  in the last 72 hours. Thyroid Function Tests: No results for input(s): "TSH", "T4TOTAL", "FREET4", "T3FREE", "THYROIDAB" in the last 72 hours.  Anemia Panel: No results for input(s): "VITAMINB12", "FOLATE", "FERRITIN", "TIBC", "IRON", "RETICCTPCT" in the last 72 hours. Sepsis Labs: Recent Labs  Lab 10/06/22 0204  PROCALCITON <0.10    Recent Results (from the past 240 hour(s))  Resp panel by RT-PCR (RSV, Flu A&B, Covid) Anterior Nasal Swab     Status: None   Collection Time: 10/05/22 10:18 PM   Specimen: Anterior Nasal Swab  Result Value Ref Range Status   SARS Coronavirus 2 by RT PCR NEGATIVE NEGATIVE Final   Influenza A by PCR NEGATIVE NEGATIVE Final   Influenza B by PCR NEGATIVE NEGATIVE Final    Comment: (NOTE) The Xpert Xpress SARS-CoV-2/FLU/RSV plus assay is intended as an aid in the diagnosis of influenza from Nasopharyngeal swab specimens and should not be used as a sole basis for treatment. Nasal washings and aspirates are unacceptable for Xpert Xpress SARS-CoV-2/FLU/RSV testing.  Fact Sheet for Patients: EntrepreneurPulse.com.au  Fact Sheet for Healthcare Providers: IncredibleEmployment.be  This test is not yet approved or cleared by the [place] FDA and has been authorized for detection and/or diagnosis of SARS-CoV-2 by FDA under an Emergency Use Authorization (EUA). This EUA will remain in effect (meaning this test can be  used) for the duration of the COVID-19 declaration under Section 564(b)(1) of the Act, 21 U.S.C. section 360bbb-3(b)(1), unless the authorization is terminated or revoked.     Resp Syncytial Virus by PCR NEGATIVE NEGATIVE Final    Comment: (NOTE) Fact Sheet for Patients: EntrepreneurPulse.com.au  Fact Sheet for Healthcare Providers: IncredibleEmployment.be  This test is not yet approved or cleared by the [place] FDA and has been authorized for detection and/or diagnosis of SARS-CoV-2 by FDA under an Emergency Use Authorization (EUA). This EUA will remain in effect (meaning this test can be used) for the duration of the COVID-19 declaration under Section 564(b)(1) of the Act, 21 U.S.C. section 360bbb-3(b)(1), unless the authorization is terminated or revoked.  Performed at [facility], [place] [address] [place], [place] [address]   Culture, blood (single) w Reflex to ID Panel     Status: None (Preliminary result)   Collection Time: 10/06/22  8:30 AM   Specimen: BLOOD  Result Value Ref Range Status   Specimen Description BLOOD RIGHT ANTECUBITAL  Final   Special Requests   Final    BOTTLES DRAWN AEROBIC AND ANAEROBIC Blood Culture results may not be optimal due to an excessive volume of blood received in culture bottles   Culture   Final    NO GROWTH 4 DAYS Performed at [facility], [place] [address] [place], [place] [address]    Report Status PENDING  Incomplete         Radiology Studies: No results found.      Scheduled Meds:  acetaminophen  1,000 mg Oral TID   Chlorhexidine Gluconate Cloth  6 each Topical Daily   insulin aspart  0-15 Units Subcutaneous TID WC   insulin aspart  0-5 Units Subcutaneous QHS   insulin aspart  3 Units Subcutaneous TID WC   insulin glargine-yfgn  15 Units Subcutaneous QHS   lidocaine  1 patch Transdermal Q24H   metoprolol tartrate  25 mg Oral BID   polyethylene glycol  17 g Oral  Daily   tamsulosin  0.4 mg Oral Daily   Continuous Infusions:  DAPTOmycin (CUBICIN) 600 mg in sodium  chloride 0.9 % IVPB       LOS: 6 days    Time spent: 45 minutes spent on chart review, discussion with nursing staff, consultants, updating family and interview/physical exam; more than 50% of that time was spent in counseling and/or coordination of care.    Geradine Girt, DO [facility] Available via Epic secure chat 7am-7pm After these hours, please refer to coverage provider listed on [email or web address] 10/11/2022, 8:50 AM

## 2022-10-11 NOTE — Consult Note (Incomplete)
Marland Kitchen   HEMATOLOGY/ONCOLOGY CONSULTATION NOTE  Date of Service: 10/12/2022  Patient Care Team: [name], MD as PCP - General [place] Lack Charlann Lange, MD as PCP - Cardiology (Cardiology) Szabat, Darnelle Maffucci, [facility] (Inactive) as Pharmacist (Pharmacist) Sherlynn Stalls, MD as Consulting Physician (Ophthalmology)  CHIEF COMPLAINTS/PURPOSE OF CONSULTATION:  L3 spinal metastasis from poorly differentiated prostate cancer  HISTORY OF PRESENTING ILLNESS:   Nicholas Burton is a wonderful 89 y.o. male who has been referred to [place] by Dr Laverna Peace MD for evaluation and management of metastatic poorly differentiated prostate cancer with oligometastatic disease to the L3.  Patient was seen with his son at bedside.  He has a history of hypertension, chronic diastolic heart failure, dyslipidemia, diabetes type 2, dementia and was admitted to [facility] with acute encephalopathy 10/05/2022.  The patient was apparently noted to have 1 to 2 days of progressive confusion in the context of underlying dementia.  At baseline he apparently is also oriented x 3. He also noted significant lower back pain as well as some left lower extremity weakness.  No change in bowel or bladder habits.  No preceding trauma. No previous history of malignancies.  Patient had a CT of the head which showed no acute intracranial findings CT of the lumbar spine was done which showed a lytic destructive pattern involving L2-L3 and L4 vertebral bodies concerning for possible discitis versus osteomyelitis versus tumor.  MRI lumbar spine on 10/05/2022 showed Findings concerning for osteomyelitis discitis at the L3-4 level. Abnormal enhancing material within the ventral epidural space at the level of L3, concerning for epidural phlegmon. These changes superimposed on underlying spondylosis and facet arthrosis resultant severe spinal stenosis. While these changes are favored to reflect infection, a possible neoplastic process involving  the L3 vertebral body remains difficult to exclude. Sinning interval follow-up imaging may be helpful for further evaluation as warranted.  This was treated with empiric antibiotics and is being followed by infectious disease. Patient subsequently underwent a biopsy of L3 10/07/2022 which showed metastatic poorly differentiated prostate cancer.  Patient subsequently had a CT of the chest abdomen pelvis without contrast at our recommendation which was done on 10/11/2022 and showed no evidence of metastatic disease in the chest abdomen or pelvis.  Diffuse bladder wall thickening with surrounding inflammatory stranding worrisome for cystitis.  Stable abnormal appearance of L3 and nonspecific presacral edema.  PSA levels were sent out and are currently pending.  Patient notes some low back pain which is not bad when he is resting in bed. He notes no bladder or bowel changes.  No blood in the urine or stools.  Initial goals of care discussion with the patient's son at bedside along with the patient.  They are inclined to consider limited treatments which may not affect the patient's functional status too much and are open to the idea of palliative radiation to help with his back pain and might be open to the idea of hormonal treatments if this is confirmed to be prostate cancer on PSA levels as well.  MEDICAL HISTORY:  Past Medical History:  Diagnosis Date   Allergy    ANEMIA ASSOCIATED W/OTHER [facility] NUTRITIONAL DEFIC 08/11/2010   Qualifier: Diagnosis of  By: Ronnald Ramp MD, [name].    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    History of skin cancer     Hyperlipidemia with target LDL less than 100 11/10/2008        Hypertension    Memory loss 05/14/2009   Obesity (BMI 30.0-34.9) 04/01/2013   Osteoarthritis    PSA elevation 10/25/2018   SKIN CANCER, HX OF 11/10/2008   Qualifier: Diagnosis of  By: Ronnald Ramp MD, [name].    Snoring 04/03/2014   Type II diabetes mellitus with manifestations ([place]) 11/10/2008   Estimated Creatinine Clearance: 38 mL/min (A) (by C-G formula based on SCr of 1.52 mg/dL (H)).    Venous stasis dermatitis of both lower extremities 01/29/2018   Vitamin D deficiency 11/10/2008    SURGICAL HISTORY: Past Surgical History:  Procedure Laterality Date   CHOLECYSTECTOMY     INGUINAL HERNIA REPAIR     x 3   IR FLUORO GUIDED NEEDLE PLC ASPIRATION/INJECTION LOC  10/07/2022   JOINT REPLACEMENT     KNEE SURGERY     x 2   MELANOMA EXCISION     x 3 -- Left arm   TOTAL KNEE ARTHROPLASTY     x 3    SOCIAL HISTORY: Social History   Socioeconomic History   Marital status: Married    Spouse name: Not on file   Number of children: 4   Years of education: Not on file   Highest education level: Not on file  Occupational History   Occupation: retired    Fish farm manager: RETIRED  Tobacco Use   Smoking status: Never   Smokeless tobacco: Never  Vaping Use   Vaping Use: Never used  Substance and Sexual Activity   Alcohol use: No   Drug use: No   Sexual activity: Yes    Birth control/protection: None  Other Topics Concern   Not on file  Social History Narrative   No regular exercise   Social Determinants of Health   Financial Resource Strain: Low Risk  (07/11/2022)   Overall Financial Resource Strain (CARDIA)    Difficulty of Paying Living Expenses: Not hard at all  Food Insecurity: No Food Insecurity (07/11/2022)   Hunger Vital Sign    Worried About Running Out of Food in the Last Year: Never true    [place] in the Last Year: Never true  Transportation Needs: No Transportation Needs (07/11/2022)   PRAPARE -  Hydrologist (Medical): No    Lack of Transportation (Non-Medical): No  Physical Activity: Inactive (07/09/2021)   Exercise Vital Sign    Days of Exercise per Week: 0 days    Minutes of Exercise per Session: 0 min  Stress: No Stress Concern Present (07/11/2022)   [place]    Feeling of Stress : Not at all  Social Connections: Moderately Isolated (07/11/2022)   Social Connection and Isolation Panel [NHANES]    Frequency of Communication with Friends and Family: Twice a week    Frequency of Social Gatherings with Friends and Family: Twice a week    Attends Religious Services: Never    Marine scientist or Organizations: No    Attends Archivist Meetings: Never    Marital Status: Married  Human resources officer Violence: Not At Risk (07/11/2022)   Humiliation, Afraid, Rape,  and Kick questionnaire    Fear of Current or Ex-Partner: No    Emotionally Abused: No    Physically Abused: No    Sexually Abused: No    FAMILY HISTORY: Family History  Problem Relation Age of Onset   Heart disease Mother    Diabetes Mother    Heart disease Father    Cancer Sister    Cancer Brother    Diabetes Son    Diabetes Other     ALLERGIES:  is allergic to ace inhibitors, oxycodone, and penicillins.  MEDICATIONS:  Current Facility-Administered Medications  Medication Dose Route Frequency Provider Last Rate Last Admin   acetaminophen (TYLENOL) tablet 1,000 mg  1,000 mg Oral TID Rosezella Rumpf, NP   1,000 mg at 10/11/22 2053   Chlorhexidine Gluconate Cloth 2 % PADS 6 each  6 each Topical Daily Eulogio Bear U, DO   6 each at 10/11/22 1008   fentaNYL (SUBLIMAZE) injection 12.5 mcg  12.5 mcg Intravenous Q2H PRN Howerter, Justin B, DO       insulin aspart (novoLOG) injection 0-15 Units  0-15 Units Subcutaneous TID WC Vann, Jessica U, DO   5 Units at 10/11/22 1655   insulin aspart (novoLOG)  injection 0-5 Units  0-5 Units Subcutaneous QHS Eulogio Bear U, DO   4 Units at 10/09/22 2224   insulin aspart (novoLOG) injection 3 Units  3 Units Subcutaneous TID WC Eulogio Bear U, DO   3 Units at 10/11/22 1655   insulin glargine-yfgn (SEMGLEE) injection 15 Units  15 Units Subcutaneous QHS Eulogio Bear U, DO   15 Units at 10/11/22 2053   lidocaine (LIDODERM) 5 % 1 patch  1 patch Transdermal Q24H Rosezella Rumpf, NP   1 patch at 10/11/22 0820   melatonin tablet 3 mg  3 mg Oral QHS PRN Howerter, Justin B, DO   3 mg at 10/11/22 2053   metoprolol tartrate (LOPRESSOR) tablet 25 mg  25 mg Oral BID Eulogio Bear U, DO   25 mg at 10/11/22 2053   naloxone [facility]) injection 0.4 mg  0.4 mg Intravenous PRN Howerter, Justin B, DO       ondansetron (ZOFRAN) injection 4 mg  4 mg Intravenous Q6H PRN Howerter, Justin B, DO       polyethylene glycol (MIRALAX / GLYCOLAX) packet 17 g  17 g Oral Daily Eulogio Bear U, DO   17 g at 10/11/22 0820   tamsulosin (FLOMAX) capsule 0.4 mg  0.4 mg Oral Daily Eulogio Bear U, DO   0.4 mg at 10/11/22 [id number]   traMADol (ULTRAM) tablet 25 mg  25 mg Oral Q12H PRN Rosezella Rumpf, NP        REVIEW OF SYSTEMS:    10 Point review of Systems was done is negative except as noted above.  PHYSICAL EXAMINATION: ECOG PERFORMANCE STATUS: 3 - Symptomatic, >50% confined to bed  . Vitals:   10/11/22 1346 10/11/22 2051  BP: 126/78 109/66  Pulse: 86 99  Resp: 18 19  Temp: 98.2 F (36.8 C) 98.3 F (36.8 C)  SpO2: 99% 98%   Filed Weights   10/07/22 1505 10/10/22 0500 10/11/22 0500  Weight: 170 lb (77.1 kg) 168 lb 6.9 oz (76.4 kg) 169 lb 15.6 oz (77.1 kg)   .Body mass index is 25.1 kg/m.  GENERAL:alert, in no acute distress and comfortable SKIN: no acute rashes, no significant lesions EYES: conjunctiva are pink and non-injected, sclera anicteric OROPHARYNX: MMM, no exudates, no oropharyngeal erythema or ulceration  NECK: supple, no JVD LYMPH:  no palpable  lymphadenopathy in the cervical, axillary or inguinal regions LUNGS: clear to auscultation b/l with normal respiratory effort HEART: regular rate & rhythm ABDOMEN:  normoactive bowel sounds , non tender, not distended. Extremity: no pedal edema PSYCH: alert & oriented x 3 with fluent speech NEURO: no focal motor/sensory deficits  LABORATORY DATA:  I have reviewed the data as listed  .    Latest Ref Rng & Units 10/10/2022    3:41 AM 10/09/2022    3:41 AM 10/08/2022    4:26 AM  CBC  WBC 4.0 - 10.5 K/uL 10.6  10.8  13.4   Hemoglobin 13.0 - 17.0 g/dL 10.7  10.8  11.9   Hematocrit 39.0 - 52.0 % 33.1  32.3  36.7   Platelets 150 - 400 K/uL 224  191  210     .    Latest Ref Rng & Units 10/11/2022    2:58 AM 10/10/2022    3:41 AM 10/09/2022    3:41 AM  CMP  Glucose 70 - 99 mg/dL 96  215  111   BUN 8 - 23 mg/dL 16  24  24   $ Creatinine 0.61 - 1.24 mg/dL 1.44  1.81  2.17   Sodium 135 - 145 mmol/L 138  137  138   Potassium 3.5 - 5.1 mmol/L 3.5  3.5  3.3   Chloride 98 - 111 mmol/L 104  106  105   CO2 22 - 32 mmol/L 25  22  24   $ Calcium 8.9 - 10.3 mg/dL 8.2  8.0  8.1    SURGICAL PATHOLOGY  CASE: [id number]  PATIENT: Nahun Lindahl  Surgical Pathology Report      Clinical History: (nt)      FINAL MICROSCOPIC DIAGNOSIS:   A. L3 VERTEBRAL BODY, BONE BIOPSY:  Metastatic poorly differentiated carcinoma compatible with prostatic  primary   COMMENT:   Sections show a minute and fragmented core of trabecular bone with  marrow infiltrated by sheets and nests of atypical cells with enlarged  round to oval irregular nuclei with nucleoli with associated tumor  necrosis. Three immunohistochemical stains are performed with adequate  controls.  The metastatic tumor shows weak positivity for pancytokeratin  and of the prostatic marker prostein.  The tumor is negative for the  pulmonary adeno marker TTF-1.  The cytohistomorphology and this  immunohistochemical pattern support the above  diagnosis.     SURGICAL PATHOLOGY CASE: [id number] PATIENT: Burgess Want Surgical Pathology Report     Clinical History: (nt)     FINAL MICROSCOPIC DIAGNOSIS:  A. L3 VERTEBRAL BODY, BONE BIOPSY: Metastatic poorly differentiated carcinoma compatible with prostatic primary  COMMENT:  Sections show a minute and fragmented core of trabecular bone with marrow infiltrated by sheets and nests of atypical cells with enlarged round to oval irregular nuclei with nucleoli with associated tumor necrosis. Three immunohistochemical stains are performed with adequate controls.  The metastatic tumor shows weak positivity for pancytokeratin and of the prostatic marker prostein.  The tumor is negative for the pulmonary adeno marker TTF-1.  The cytohistomorphology and this immunohistochemical pattern support the above diagnosis.    RADIOGRAPHIC STUDIES: I have personally reviewed the radiological images as listed and agreed with the findings in the report. CT CHEST ABDOMEN PELVIS WO CONTRAST  Result Date: 10/11/2022 CLINICAL DATA:  Pancreatic cancer staging EXAM: CT CHEST, ABDOMEN AND PELVIS WITHOUT CONTRAST TECHNIQUE: Multidetector CT imaging of the chest, abdomen and pelvis was performed following the  standard protocol without IV contrast. RADIATION DOSE REDUCTION: This exam was performed according to the departmental dose-optimization program which includes automated exposure control, adjustment of the mA and/or kV according to patient size and/or use of iterative reconstruction technique. COMPARISON:  PET-CT 10/05/2005. Lumbar spine CT 10/05/2022. MRI lumbar spine 10/05/2022. FINDINGS: CT CHEST FINDINGS Cardiovascular: No significant vascular findings. Normal heart size. No pericardial effusion. Mediastinum/Nodes: No enlarged mediastinal, hilar, or axillary lymph nodes. Thyroid gland, trachea, and esophagus demonstrate no significant findings. Lungs/Pleura: Lungs are clear. No pleural  effusion or pneumothorax. Musculoskeletal: No chest wall mass or suspicious bone lesions identified. Multilevel degenerative changes affect the spine. CT ABDOMEN PELVIS FINDINGS Hepatobiliary: No focal liver abnormality is seen. Status post cholecystectomy. No biliary dilatation. Pancreas: No pancreatic ductal dilatation or surrounding inflammatory changes. There is atrophy of the pancreatic body and tail. Spleen: Normal in size without focal abnormality. Adrenals/Urinary Tract: Bilateral kidneys and adrenal glands are within normal limits. Bladder is decompressed with Foley catheter. There is diffuse bladder wall thickening with surrounding inflammatory stranding. Stomach/Bowel: Stomach is within normal limits. Appendix appears normal. No evidence of bowel wall thickening, distention, or inflammatory changes. There is sigmoid and descending colon diverticulosis. Vascular/Lymphatic: Aortic atherosclerosis. No enlarged abdominal or pelvic lymph nodes. Duplicated IVC noted. Reproductive: Prostate gland is enlarged. Other: There is no ascites. There is a small fat containing right inguinal hernia. There is nonspecific presacral edema. Musculoskeletal: The bones are osteopenic. Severe degenerative changes affect the spine. There is mottled appearance of the L3 vertebral body with some subtle linear lucencies present in trace loss of vertebral body height. Paravertebral edema and stranding again noted at this level. Findings are unchanged from prior and compatible with MRI findings of osteomyelitis discitis at L3-L4 with possible neoplastic process of the L3 vertebral body. IMPRESSION: 1. No evidence for metastatic disease in the chest, abdomen or pelvis. 2. Diffuse bladder wall thickening with surrounding inflammatory stranding worrisome for cystitis. 3. Stable abnormal appearance of the L3 which may represent osteomyelitis/discitis versus neoplastic process. 4. Nonspecific presacral edema. 5. Colonic diverticulosis.  Aortic Atherosclerosis (ICD10-I70.0). Electronically Signed   By: Ronney Asters M.D.   On: 10/11/2022 17:49   IR Fluoro Guide Ndl Plmt / BX  Result Date: 10/09/2022 INDICATION: Low back pain secondary to osteomyelitis/discitis at L3-L4. EXAM: FLUOROSCOPIC GUIDED CORE BIOPSY AT L3 MEDICATIONS: None. ANESTHESIA/SEDATION: Moderate (conscious) sedation was employed during this procedure. A total of Versed 1 mg and Fentanyl 25 mcg was administered intravenously by the radiology nurse. Total intra-service moderate Sedation Time: 23 minutes. The patient's level of consciousness and vital signs were monitored continuously by radiology nursing throughout the procedure under my direct supervision. COMPLICATIONS: None immediate. PROCEDURE: Informed written consent was obtained from the patient after a thorough discussion of the procedural risks, benefits and alternatives. All questions were addressed. Maximal Sterile Barrier Technique was utilized including caps, mask, sterile gowns, sterile gloves, sterile drape, hand hygiene and skin antiseptic. A timeout was performed prior to the initiation of the procedure. Patient was laid prone on the fluoroscopic table. Skin overlying the lumbar region was then prepped and draped in the usual manner. The right pedicle at L3 was then identified, and the skin entry site was then infiltrated with 0.25% bupivacaine and carried to the pedicle itself. Under intermittent biplane fluoroscopy, a 13 gauge Cook spinal needle was then advanced to the posterior 1/3 at L3. Two passes were then made with a 16 gauge core biopsy needle. Biopsy material was then obtained with a  20 mL syringe and sent for analysis as per requesting MD. The 13 gauge Cook spinal needle was removed. Hemostasis was achieved at the skin entry site. Patient tolerated the procedure well. IMPRESSION: Status post fluoroscopic guided core biopsy at L3 as described. Electronically Signed   By: Luanne Bras M.D.   On:  10/09/2022 09:59   MR Lumbar Spine W Wo Contrast  Result Date: 10/06/2022 CLINICAL DATA:  Initial evaluation for low back pain, infection. EXAM: MRI LUMBAR SPINE WITHOUT AND WITH CONTRAST TECHNIQUE: Multiplanar and multiecho pulse sequences of the lumbar spine were obtained without and with intravenous contrast. CONTRAST:  7.12m GADAVIST GADOBUTROL 1 MMOL/ML IV SOLN COMPARISON:  CT from earlier the same day. FINDINGS: Segmentation:  Examination degraded by motion artifact. Standard segmentation. Lowest well-formed disc space labeled the L5-S1 level. Alignment:  Moderate levoscoliosis.  No significant listhesis. Vertebrae: Abnormal T1 hypointensity, with heterogeneous STIR hyperintensity and enhancement seen within the L3 vertebral body. Edema and enhancement noted within the adjacent L3-4 interspace as well as about the superior endplate of L4. Findings are nonspecific, but most concerning for possible osteomyelitis discitis. Surrounding paraspinous edema and inflammatory changes. No soft tissue collections. Abnormal enhancing material within the ventral epidural space at the level of L3 measures approximately 2.9 x 1.4 cm (series 8, image 19). Findings concerning for epidural phlegmon. 1.6 cm nonenhancing material at the level could reflect a small epidural abscess versus disc protrusion (series 8, image 17). Ventral epidural enhancement extends from the L1-2 through L4-5 interspace. While these findings are favored to be infectious/inflammatory in nature, a possible underlying neoplastic process involving the L3 vertebral body remains difficult to exclude. No other convincing evidence for acute infection elsewhere within the lumbar spine. Reactive endplate changes about the L1-2, L2-3, and L5-S1 interspaces favored to be degenerative. Mild edema and enhancement about the L2-3 and L3-4 facets also favored to be degenerative. Underlying bone marrow signal intensity diffusely heterogeneous. No other worrisome  osseous lesions. Conus medullaris and cauda equina: Conus extends to the T12 level. Conus and cauda equina appear normal. Paraspinal and other soft tissues: Paraspinous edema and enhancement adjacent to the L3-4 interspace as above. No collections. Partially visualized urinary bladder is markedly distended with associated bladder wall trabeculation and/or diverticula. Mild bilateral hydroureteronephrosis, suspected to be related to bladder distension. Disc levels: L1-2: Advanced degenerative intervertebral disc space narrowing with diffuse disc bulge and disc desiccation. Reactive endplate spurring. Moderate right worse than left facet and ligament flavum hypertrophy. No significant spinal stenosis. Foramina remain patent. L2-3: Moderate degenerative intervertebral disc space narrowing with diffuse disc bulge, asymmetric to the left. Associated reactive endplate spurring. 1.6 cm nonenhancing material within the right ventral epidural space could reflect a right subarticular disc protrusion or possibly epidural abscess (series 8, image 17). Moderate facet and ligament flavum hypertrophy. Resultant mild canal with moderate left lateral recess stenosis. Moderate left L2 foraminal narrowing. Right neural foramen remains patent. L3-4: Advanced intervertebral disc space narrowing with findings concerning for osteomyelitis discitis as above. Probable epidural phlegmon within the ventral epidural space at the level of L3 (series 8, image 19). Moderate left worse than right facet arthrosis. Resultant severe spinal stenosis. Moderate to severe left with mild right L3 foraminal narrowing. L4-5: Degenerative intervertebral disc space narrowing with disc desiccation and diffuse disc bulge. Left-sided reactive endplate spurring. Moderate facet hypertrophy. Resultant mild narrowing of the left lateral recess. Central canal remains patent. Mild to moderate left L4 foraminal narrowing. Right neural foramen remains patent. L5-S1:  Degenerative intervertebral disc space narrowing with disc desiccation and diffuse disc bulge. Reactive endplate spurring. Moderate bilateral facet arthrosis. No significant spinal stenosis. Mild bilateral L5 foraminal narrowing. IMPRESSION: 1. Findings concerning for osteomyelitis discitis at the L3-4 level. Abnormal enhancing material within the ventral epidural space at the level of L3, concerning for epidural phlegmon. These changes superimposed on underlying spondylosis and facet arthrosis resultant severe spinal stenosis. While these changes are favored to reflect infection, a possible neoplastic process involving the L3 vertebral body remains difficult to exclude. Amero interval follow-up imaging may be helpful for further evaluation as warranted. 2. Surrounding paraspinous edema and enhancement, but with no drainable soft tissue collection collections. 3. Underlying advanced multilevel degenerative spondylosis and facet arthrosis as detailed above. No other high-grade spinal stenosis 4. Markedly distended urinary bladder with associated bladder wall trabeculation and/or diverticula. Clinical correlation for chronic outlet obstruction recommended. Mild bilateral hydroureteronephrosis, suspected to be related to bladder distension. Electronically Signed   By: Jeannine Boga M.D.   On: 10/06/2022 00:47   CT Lumbar Spine Wo Contrast  Result Date: 10/05/2022 CLINICAL DATA:  Low back pain.  Increased fracture risk. EXAM: CT LUMBAR SPINE WITHOUT CONTRAST TECHNIQUE: Multidetector CT imaging of the lumbar spine was performed without intravenous contrast administration. Multiplanar CT image reconstructions were also generated. RADIATION DOSE REDUCTION: This exam was performed according to the departmental dose-optimization program which includes automated exposure control, adjustment of the mA and/or kV according to patient size and/or use of iterative reconstruction technique. COMPARISON:  Radiography  07/26/2022 FINDINGS: Segmentation: 5 lumbar type vertebral bodies. Alignment: Chronic mild S shaped scoliotic curvature. Vertebrae: Chronic degenerative endplate changes at the L1-2 level. Newly seen lytic destructive pattern throughout the L3 vertebral body, worse towards the left side. Question some involvement of the L2 vertebral body and superior aspect of the L4 vertebral body. Lucent focus within the central portion of the S1 vertebral body. These findings could be due to metastatic disease, infection or both. See below. Paraspinal and other soft tissues: Distended bladder. Aortic atherosclerosis. Pronounced edematous change in the soft tissues surrounding the L3 vertebral body, worrisome in particular for infection but possibly consistent with tumor as well. Disc levels: T9-10 through T12-L1: Negative L1-2: Chronic disc degeneration with loss of disc height. Endplate osteophytes and mild bulging of the disc. No compressive stenosis. L2-3: Disc space narrowing and vacuum phenomenon. Circumferential bulging of the disc. Facet and ligamentous hypertrophy. Multifactorial stenosis at this level that could cause neural compression. As noted above, there may be some lytic changes of the inferior L2 vertebral body. This raises the possibility of discitis osteomyelitis involving this level. L3-4: Lytic destructive appearance of the L3 vertebral body with loss of height more on the left. Edematous change of the paravertebral soft tissues. Bulging of the disc. Facet and ligamentous hypertrophy. Severe stenosis at this level. Epidural abscess not excluded. As noted above, there could be lesser destructive change of superior L4. Certainly the findings at this level or worrisome for discitis osteomyelitis. L4-5: Chronic disc degeneration with loss of disc height. Bulging of the disc. Facet and ligamentous hypertrophy. Moderate degenerative type stenosis. L5-S1: Chronic disc degeneration with loss of disc height. Endplate  osteophytes and bulging of the disc. Facet degeneration. No canal stenosis. Mild bilateral foraminal stenosis. Lucent area within the S1 vertebral body which is indeterminate. IMPRESSION: 1. Lytic destructive pattern throughout the L3 vertebral body with loss of height more on the left. Question some involvement of the L2 vertebral body and superior  aspect of the L4 vertebral body. Pronounced edematous change in the paravertebral soft tissues surrounding the L3 vertebral body, worrisome for infection but possibly consistent with tumor as well. 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 Severe stenosis at the L2-3 and L3-4 levels, possibly resulting in neurogenic bladder. Epidural abscess is not excluded. Is the patient an MR candidate? 2. Lucent area within the S1 vertebral body which is indeterminate. 3. Chronic degenerative changes at L1-2, L4-5 and L5-S1 as above. Aortic Atherosclerosis (ICD10-I70.0). Electronically Signed   By: Nelson Chimes M.D.   On: 10/05/2022 21:20   DG Chest 1 View  Result Date: 10/05/2022 CLINICAL DATA:  Altered mental status EXAM: CHEST  1 VIEW COMPARISON:  06/02/2021 FINDINGS: Heart is normal size. Mediastinal contours within normal limits. Bibasilar atelectasis. No effusions or pneumothorax. No acute bony abnormality. IMPRESSION: Bibasilar atelectasis. Electronically Signed   By: Rolm Baptise M.D.   On: 10/05/2022 21:13   DG Pelvis 1-2 Views  Result Date: 10/05/2022 CLINICAL DATA:  Left hip and pelvic pain EXAM: PELVIS - 1-2 VIEW COMPARISON:  None Available. FINDINGS: Mild symmetric degenerative changes in the hips with joint space narrowing and spurring. No acute bony abnormality. Specifically, no fracture, subluxation, or dislocation. Degenerative changes in the visualized lower lumbar spine. IMPRESSION: No acute bony abnormality. Electronically Signed   By: Rolm Baptise M.D.   On: 10/05/2022 21:13   CT Head Wo Contrast  Result Date:  10/05/2022 CLINICAL DATA:  Delirium.  Low back pain. EXAM: CT HEAD WITHOUT CONTRAST TECHNIQUE: Contiguous axial images were obtained from the base of the skull through the vertex without intravenous contrast. RADIATION DOSE REDUCTION: This exam was performed according to the departmental dose-optimization program which includes automated exposure control, adjustment of the mA and/or kV according to patient size and/or use of iterative reconstruction technique. COMPARISON:  MRI 02/28/2004 FINDINGS: Brain: Generalized brain volume loss. Moderate chronic small-vessel ischemic changes of the white matter. No sign of acute infarction, mass lesion, hemorrhage, hydrocephalus or extra-axial collection. Vascular: There is atherosclerotic calcification of the major vessels at the base of the brain. Skull: Negative Sinuses/Orbits: Opacified right maxillary sinus.  Orbits negative. Other: None IMPRESSION: 1. No acute CT finding. Atrophy and chronic small-vessel ischemic changes of the white matter. 2. Opacified right maxillary sinus. Electronically Signed   By: Nelson Chimes M.D.   On: 10/05/2022 21:11    ASSESSMENT & PLAN:   89 year old male with no previous history of malignancy.  Previous history of hypertension, dyslipidemia, diabetes, CHF dementia though alert oriented x 3 at baseline admitted with altered mental status which is now more or less cleared significant new back pain.  #1 L2-L4 pain and swelling and concern for infection versus neoplastic process causing back pain. 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 Cannot rule out additional infection.  No cultures were sent from his L3 biopsy. PSA levels have been sent and pending CT chest abdomen pelvis did not show any other sites of overt metastatic disease at this time or overt prostate lesions.  #2 history of dementia though apparently alert oriented x 3 at baseline with poor cognition.  #3  hypertension, dyslipidemia, diabetes, CHF.  Plan -I discussed in detail with the patient and his son at bedside all available lab results and imaging studies. -We discussed the biopsy results from the L3 vertebra showing metastatic poorly differentiated carcinoma compatible with a prostatic primary. -His CT chest abdomen pelvis were discussed in  detail and do not show any other overt findings of metastatic process. -I discussed with the patient and his son and they are agreeable with consulting radiation oncology for possible palliative radiation to his painful and symptomatic bone metastases in the lumbar spine. -I discussed with radiation oncology who have been consulted and are planning his radiation treatment. -He will likely need to be on steroids roughly the equivalent of dexamethasone 4 mg once to twice daily with appropriate adjustment of his diabetes medications.  Will defer steroid management to his radiation oncology team. -We discussed that his PSA levels have been sent and are currently pending -We discussed that we might need to start him on possible ADT based on his ongoing goals of care.  Discussed potentially be done as outpatient depending on how he is doing. -Also based on goals of care there might be an indication to start him on outpatient Xgeva monthly for bone strengthening in the setting of bone metastases. -Will check vitamin D levels and replace to maintain vitamin D levels of 60 and 90 -Goals of care were discussed in detail with the patient and his son and they are agreeable with this plan currently. -Appreciate excellent hospital medicine cares. -Case was discussed with Dr. Eliseo Squires as well as Dr. Renne Crigler  The total time spent in the appointment was 65 minutes*.  All of the patient's and his son's questions were answered with apparent satisfaction. The patient knows to call the clinic with any problems, questions or concerns.   Sullivan Lone MD MS AAHIVMS [facility]  [facility] Hematology/Oncology Physician [facility]  .*Total Encounter Time as defined by the Centers for Medicare and Medicaid Services includes, in addition to the face-to-face time of a patient visit (documented in the note above) non-face-to-face time: obtaining and reviewing outside history, ordering and reviewing medications, tests or procedures, care coordination (communications with other health care professionals or caregivers) and documentation in the medical record.

## 2022-10-11 NOTE — Plan of Care (Signed)

## 2022-10-11 NOTE — Progress Notes (Signed)
OT Cancellation Note  Patient Details Name: Nicholas Burton MRN: [id number] DOB: 1928-06-28   Cancelled Treatment:    Reason Eval/Treat Not Completed: Patient declined, no reason specified (Pt recently returned from ambulation with PT) OT did spend time with patient and son. 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 Much improved cognition from admission. Reports walking down the hall with PT. OT will continue to follow acutely.   [place] 10/11/2022, 12:47 PM  Jesse Sans OTR/L [place] Office: [phone]

## 2022-10-11 NOTE — Progress Notes (Signed)
Inpatient Rehab Admissions Coordinator:   Per therapy recommendations pt was screened for CIR by Shann Medal, PT, DPT.  Pathology showed carcinoma metastatic with suspected prostate source, and we will need a plan of care to pursue CIR.  Will rescreen in another day or so.   Shann Medal, PT, DPT Admissions Coordinator [phone] 10/11/22  3:24 PM

## 2022-10-11 NOTE — Progress Notes (Signed)
PHARMACY NOTE:  ANTIMICROBIAL RENAL DOSAGE ADJUSTMENT  Current antimicrobial regimen includes a mismatch between antimicrobial dosage and estimated renal function.  As per policy approved by the Pharmacy & Therapeutics and Medical Executive Committees, the antimicrobial dosage will be adjusted accordingly.  Current antimicrobial dosage:  Daptomycin 39m/kg IV q48h  Indication: osteomyelitis, discitis, epidural abscess  Renal Function:  Estimated Creatinine Clearance: 32.7 mL/min (A) (by C-G formula based on SCr of 1.44 mg/dL (H)). []$      On intermittent HD, scheduled: []$      On CRRT    Antimicrobial dosage has been changed to:  Daptomycin 866mkg IV q24h  Additional comments:   Thank you for allowing pharmacy to be a part of this patient's care.  MaDimple NanasPharmD, BCPS 10/11/2022 7:47 AM

## 2022-10-11 NOTE — Progress Notes (Signed)
Daily Progress Note   Patient Name: Nicholas Burton       Date: 10/11/2022 DOB: 06/08/1928  Age: 89 y.o. MRN#: [id number] Attending Physician: Nicholas Girt, DO Primary Care Physician: Nicholas Lima, MD Admit Date: 10/05/2022 Length of Stay: 6 days  Reason for Consultation/Follow-up: Establishing goals of care  HPI/Patient Profile:  89 year old gentleman with confusion and altered mental status multiple medical comorbidities possible encephalopathy with CT and MRI evidence of discitis osteomyelitis and a component of epidural abscess.    The palliative care team has been asked to get involved for further goals of care conversations.  Subjective:   Subjective: Chart Reviewed. Updates received. Patient Assessed. Created space and opportunity for patient  and family to explore thoughts and feelings regarding current medical situation.  Today's Discussion: Prior to seeing the patient I spoke with Dr. Eliseo Burton about biopsy results which shows metastatic poorly differentiated carcinoma compatible with prostatic primary.  She is just told the patient's sons.  The patient is currently sleeping and she did not want to wake him, per family request.  Patient's wife is not at the bedside.  When I entered the room the patient's sons were present, the patient was sleeping and I elected not to wake him.  They understand his diagnosis.  I also understand that oncology has been consulted and ultimately recommended multiple scans and a PSA test to know what options may be available.  We discussed the possibility of radiation to the spine for pain management.  However, they understand full recommendations are pending oncology evaluation.  They state that they will talk to the patient and his wife after he wakes up.  I explained that palliative medicine will continue to support patient and family as they are figuring out how to best approach this.  They verbalized appreciation.  I provided emotional and general  support through therapeutic listening, empathy, sharing of stories, and other techniques. I answered all questions and addressed all concerns to the best of my ability.  Review of Systems  Unable to perform ROS: Other  (Sleeping, not woken per family request)  Objective:   Vital Signs:  BP 135/75 (BP Location: Right Arm)   Pulse 98   Temp 98.4 F (36.9 C) (Oral)   Resp 17   Ht 5' 9"$  (1.753 m)   Wt 77.1 kg   SpO2 91%   BMI 25.10 kg/m   Physical Exam: Physical Exam Vitals and nursing note reviewed.  Constitutional:      General: He is not in acute distress.    Appearance: He is ill-appearing.  HENT:     Head: Normocephalic and atraumatic.  Cardiovascular:     Rate and Rhythm: Normal rate.  Pulmonary:     Effort: Pulmonary effort is normal.     Palliative Assessment/Data: 50-60%    Existing Vynca/ACP Documentation: None  Assessment & Plan:   Impression: Present on Admission:  Acute encephalopathy  Lytic lesion of bone on x-ray  Hypokalemia  SIRS (systemic inflammatory response syndrome) (HCC)  Chronic diastolic CHF (congestive heart failure) (HCC)  Essential hypertension  HLD (hyperlipidemia)  Diskitis  SUMMARY OF RECOMMENDATIONS   Remain full code Full scope of care Pending further discussion of pathology (sons aware) Await PT/OT recommendations for rehab level Planned further imaging/serology work-up per oncology Planned further discussion and goals after options known Further recommendations to follow work-up PMT will follow-up 2/15 to allow time for work-up Please call us for any significant clinical change or new palliative needs  before then  Symptom Management:  Tylenol 1 g 3 times daily Lidoderm patch (on 12 hours, off 12 hours) Ultram 25 mg p.o. every 12 hours as needed  Code Status: Full code  Prognosis: Unable to determine  Discharge Planning: To Be Determined  Discussed with: Patient's family, medical team, nursing team  Thank you  for allowing [place] to participate in the care of Nicholas Burton PMT will continue to support holistically.  Time Total: 60 min  Visit consisted of counseling and education dealing with the complex and emotionally intense issues of symptom management and palliative care in the setting of serious and potentially life-threatening illness. Greater than 50%  of this time was spent counseling and coordinating care related to the above assessment and plan.  Nicholas Field, NP Palliative Medicine Team  Team Phone # [phone] (Nights/Weekends)  04/27/2021, 8:17 AM

## 2022-10-11 NOTE — Progress Notes (Signed)
Physical Therapy Treatment Patient Details Name: Nicholas Burton MRN: [id number] DOB: [birth year] Today's Date: 10/11/2022   History of Present Illness 89 y.o. male with medical history significant for dementia, type 2 diabetes mellitus, chronic diastolic heart failure, essential hypertension, hyperlipidemia, admitted 2/7 with acute encephalopathy. MRI evidence of discitis osteomyelitis and a component of epidural abscess.  Pt is s/p IR aspiration/biopsy 2/9.    PT Comments    Pt progressing towards physical therapy goals. He was able to demonstrate hallway ambulation with RW for support. No overt LOB noted. Overall tolerance for functional activity is decreased, and pt requiring intermittent assist for OOB mobility. Pt's son reports the family's hope is for pt to return to his home with wife (also 31 years old). Family asking about AIR level rehab. Feel pt has the motivation to participate and would be able to participate with rest breaks until tolerance improves. It is reasonable to consider AIR to decrease burden of care on family, maximize functional independence, decrease risk for falls, and allow the pt the opportunity to return home to his spouse. Will continue to follow and progress as able per POC.   Recommendations for follow up therapy are one component of a multi-disciplinary discharge planning process, led by the attending physician.  Recommendations may be updated based on patient status, additional functional criteria and insurance authorization.  Follow Up Recommendations  Acute inpatient rehab (3hours/day)     Assistance Recommended at Discharge Intermittent Supervision/Assistance  Patient can return home with the following A little help with bathing/dressing/bathroom;Assistance with cooking/housework;Direct supervision/assist for financial management;Assist for transportation   Equipment Recommendations  None recommended by PT    Recommendations for Other Services        Precautions / Restrictions Precautions Precautions: Fall Restrictions Weight Bearing Restrictions: No     Mobility  Bed Mobility Overal bed mobility: Needs Assistance Bed Mobility: Sit to Sidelying         Sit to sidelying: Min guard General bed mobility comments: Increased time required, but pt able to transition back to bed without assistance. Pt picking up his legs to bring into bed.    Transfers Overall transfer level: Needs assistance Equipment used: Rolling walker (2 wheels) Transfers: Sit to/from Stand Sit to Stand: Min guard           General transfer comment: Hands on guarding for power up to full stand. No assist required. VC's for hand placement on seated surface for safety.    Ambulation/Gait Ambulation/Gait assistance: Min guard Gait Distance (Feet): 200 Feet (100', seated rest break, 100') Assistive device: Rolling walker (2 wheels) Gait Pattern/deviations: Step-through pattern, Decreased stride length Gait velocity: Decreased Gait velocity interpretation: 1.31 - 2.62 ft/sec, indicative of limited community ambulator   General Gait Details: Slow but generally steady with RW for support. Trunk flexed throughout due to kyphosis.   Stairs             Wheelchair Mobility    Modified Rankin (Stroke Patients Only)       Balance Overall balance assessment: Needs assistance Sitting-balance support: Feet supported Sitting balance-Leahy Scale: Good Sitting balance - Comments: can reach outside BOS without LOB   Standing balance support: Bilateral upper extremity supported Standing balance-Leahy Scale: Poor Standing balance comment: Reliant on UE support                            Cognition Arousal/Alertness: Awake/alert Behavior During Therapy: WFL for tasks assessed/performed Overall Cognitive  Status: History of cognitive impairments - at baseline                                 General Comments: Very HOH         Exercises      General Comments        Pertinent Vitals/Pain Pain Assessment Pain Assessment: Faces Faces Pain Scale: Hurts even more Pain Location: Back Pain Descriptors / Indicators: Discomfort Pain Intervention(s): Limited activity within patient's tolerance, Monitored during session, Repositioned, Heat applied    Home Living                          Prior Function            PT Goals (current goals can now be found in the care plan section) Acute Rehab PT Goals Patient Stated Goal: to go home PT Goal Formulation: With patient/family Time For Goal Achievement: 10/22/22 Potential to Achieve Goals: Good Progress towards PT goals: Progressing toward goals    Frequency    Min 3X/week      PT Plan Current plan remains appropriate    Co-evaluation              AM-PAC PT "6 Clicks" Mobility   Outcome Measure  Help needed turning from your back to your side while in a flat bed without using bedrails?: None Help needed moving from lying on your back to sitting on the side of a flat bed without using bedrails?: A Little Help needed moving to and from a bed to a chair (including a wheelchair)?: A Little Help needed standing up from a chair using your arms (e.g., wheelchair or bedside chair)?: A Little Help needed to walk in hospital room?: A Little Help needed climbing 3-5 steps with a railing? : A Little 6 Click Score: 19    End of Session Equipment Utilized During Treatment: Gait belt Activity Tolerance: Patient tolerated treatment well Patient left: in bed;with call bell/phone within reach;with family/visitor present Nurse Communication: Mobility status PT Visit Diagnosis: Unsteadiness on feet (R26.81);Muscle weakness (generalized) (M62.81);Difficulty in walking, not elsewhere classified (R26.2)     Time: [id number] PT Time Calculation (min) (ACUTE ONLY): 29 min  Charges:  $Gait Training: 23-37 mins                     Rolinda Roan,  PT, DPT Acute Rehabilitation Services Secure Chat Preferred Office: [phone]    Thelma Comp 10/11/2022, 1:21 PM

## 2022-10-12 ENCOUNTER — Ambulatory Visit
Admit: 2022-10-12 | Discharge: 2022-10-12 | Disposition: A | Discharge status: Home / Self Care | Payer: Medicare Other | Attending: Radiation Oncology | Admitting: Radiation Oncology

## 2022-10-12 DIAGNOSIS — C7951 Secondary malignant neoplasm of bone: Secondary | ICD-10-CM | POA: Diagnosis not present

## 2022-10-12 DIAGNOSIS — I110 Hypertensive heart disease with heart failure: Secondary | ICD-10-CM | POA: Diagnosis not present

## 2022-10-12 DIAGNOSIS — E1159 Type 2 diabetes mellitus with other circulatory complications: Secondary | ICD-10-CM

## 2022-10-12 DIAGNOSIS — I509 Heart failure, unspecified: Secondary | ICD-10-CM | POA: Diagnosis not present

## 2022-10-12 DIAGNOSIS — E785 Hyperlipidemia, unspecified: Secondary | ICD-10-CM

## 2022-10-12 DIAGNOSIS — R9389 Abnormal findings on diagnostic imaging of other specified body structures: Secondary | ICD-10-CM | POA: Diagnosis not present

## 2022-10-12 DIAGNOSIS — M545 Low back pain, unspecified: Secondary | ICD-10-CM | POA: Diagnosis not present

## 2022-10-12 DIAGNOSIS — G9340 Encephalopathy, unspecified: Secondary | ICD-10-CM | POA: Diagnosis not present

## 2022-10-12 DIAGNOSIS — C61 Malignant neoplasm of prostate: Secondary | ICD-10-CM

## 2022-10-12 DIAGNOSIS — Z7189 Other specified counseling: Secondary | ICD-10-CM | POA: Diagnosis not present

## 2022-10-12 DIAGNOSIS — M899 Disorder of bone, unspecified: Secondary | ICD-10-CM | POA: Diagnosis not present

## 2022-10-12 DIAGNOSIS — Z515 Encounter for palliative care: Secondary | ICD-10-CM | POA: Diagnosis not present

## 2022-10-12 LAB — CBC
HCT: 33.7 % — ABNORMAL LOW (ref 39.0–52.0)
Hemoglobin: 11.1 g/dL — ABNORMAL LOW (ref 13.0–17.0)
MCH: 29.1 pg (ref 26.0–34.0)
MCHC: 32.9 g/dL (ref 30.0–36.0)
MCV: 88.2 fL (ref 80.0–100.0)
Platelets: 272 10*3/uL (ref 150–400)
RBC: 3.82 MIL/uL — ABNORMAL LOW (ref 4.22–5.81)
RDW: 15.1 % (ref 11.5–15.5)
WBC: 9.5 10*3/uL (ref 4.0–10.5)
nRBC: 0 % (ref 0.0–0.2)

## 2022-10-12 LAB — BASIC METABOLIC PANEL
Anion gap: 9 (ref 5–15)
BUN: 15 mg/dL (ref 8–23)
CO2: 24 mmol/L (ref 22–32)
Calcium: 8.1 mg/dL — ABNORMAL LOW (ref 8.9–10.3)
Chloride: 103 mmol/L (ref 98–111)
Creatinine, Ser: 1.49 mg/dL — ABNORMAL HIGH (ref 0.61–1.24)
GFR, Estimated: 44 mL/min — ABNORMAL LOW (ref >60)
Glucose, Bld: 172 mg/dL — ABNORMAL HIGH (ref 70–99)
Potassium: 3.6 mmol/L (ref 3.5–5.1)
Sodium: 136 mmol/L (ref 135–145)

## 2022-10-12 LAB — GLUCOSE, CAPILLARY
Glucose-Capillary: 164 mg/dL — ABNORMAL HIGH (ref 70–99)
Glucose-Capillary: 225 mg/dL — ABNORMAL HIGH (ref 70–99)
Glucose-Capillary: 91 mg/dL (ref 70–99)
Glucose-Capillary: 324 mg/dL — ABNORMAL HIGH (ref 70–99)

## 2022-10-12 NOTE — Plan of Care (Signed)

## 2022-10-12 NOTE — Progress Notes (Signed)
Occupational Therapy Treatment Patient Details Name: Nicholas Burton MRN: [id number] DOB: [birth year] Today's Date: 10/12/2022   History of present illness 89 y.o. male with medical history significant for dementia, type 2 diabetes mellitus, chronic diastolic heart failure, essential hypertension, hyperlipidemia, admitted 2/7 with acute encephalopathy. MRI evidence of discitis osteomyelitis and a component of epidural abscess.  Pt is s/p IR aspiration/biopsy 2/9.   OT comments  Pt progressing towards OT goals this session. Focused on increasing activity as Pt continues to present with deconditioning, decreased activity tolerance, balance, and continues to require more assist with ADL. Mod A generally for bed mobility, mod A for LB ADL (donning socks for ambulation) Pt enjoyed hallway mobility with RW, and then performed toilet transfer at min guard with cues for safety with RW and grab bars with transfers. POC updated to AIR level post-acute. Pt demonstrates the motivation, ability to participate in intensive rehab and AIR is important to decrease burden of care on family, maximize functional independence, decrease risk for falls, and allow the pt the opportunity to return home to his spouse - and return Pt to PLOF (independent in ADL and transfers). OT will continue to follow acutely.    Recommendations for follow up therapy are one component of a multi-disciplinary discharge planning process, led by the attending physician.  Recommendations may be updated based on patient status, additional functional criteria and insurance authorization.    Follow Up Recommendations  Acute inpatient rehab (3hours/day)     Assistance Recommended at Discharge Set up Supervision/Assistance  Patient can return home with the following  Assist for transportation;A little help with walking and/or transfers;A little help with bathing/dressing/bathroom;Assistance with cooking/housework   Equipment Recommendations  None  recommended by OT    Recommendations for Other Services      Precautions / Restrictions Precautions Precautions: Fall Restrictions Weight Bearing Restrictions: No       Mobility Bed Mobility Overal bed mobility: Needs Assistance Bed Mobility: Rolling, Sidelying to Sit Rolling: Mod assist Sidelying to sit: Min assist, Mod assist       General bed mobility comments: vc for sequencing, mod A for trunk elevation and to bring hips closer to the bed edge    Transfers Overall transfer level: Needs assistance Equipment used: Rolling walker (2 wheels) Transfers: Sit to/from Stand Sit to Stand: Min guard     Step pivot transfers: Min guard     General transfer comment: Hands on guarding for power up to full stand. No assist required. VC's for hand placement on seated surface for safety.     Balance Overall balance assessment: Needs assistance Sitting-balance support: Feet supported Sitting balance-Leahy Scale: Good Sitting balance - Comments: can reach outside BOS without LOB   Standing balance support: Bilateral upper extremity supported Standing balance-Leahy Scale: Poor Standing balance comment: Reliant on UE support                           ADL either performed or assessed with clinical judgement   ADL Overall ADL's : Needs assistance/impaired     Grooming: Wash/dry hands;Standing;Min guard Grooming Details (indicate cue type and reason): sink level, vc for safety with RW         Upper Body Dressing : Sitting;Minimal assistance Upper Body Dressing Details (indicate cue type and reason): donning gown on backside Lower Body Dressing: Minimal assistance;Sit to/from stand   Toilet Transfer: Min guard;Regular Toilet;Rolling walker (2 wheels);Ambulation Toilet Transfer Details (indicate cue type and  reason): vc for use of grab bars for safety - safe hand placement with RW Toileting- Clothing Manipulation and Hygiene: Sitting/lateral lean;Min  guard Toileting - Clothing Manipulation Details (indicate cue type and reason): vc for use of grab bar in lieu of RW     Functional mobility during ADLs: Min guard;Rolling walker (2 wheels);Minimal assistance General ADL Comments: decreased activity tolerance, decreased balance    Extremity/Trunk Assessment              Vision   Vision Assessment?: No apparent visual deficits   Perception     Praxis      Cognition Arousal/Alertness: Awake/alert Behavior During Therapy: WFL for tasks assessed/performed Overall Cognitive Status: History of cognitive impairments - at baseline                                 General Comments: Very HOH        Exercises      Shoulder Instructions       General Comments VSS family present and extremely supportive    Pertinent Vitals/ Pain       Pain Assessment Pain Assessment: Faces Faces Pain Scale: Hurts little more Pain Location: Back Pain Descriptors / Indicators: Discomfort Pain Intervention(s): Monitored during session, Repositioned  Home Living                                          Prior Functioning/Environment              Frequency  Min 2X/week        Progress Toward Goals  OT Goals(current goals can now be found in the care plan section)  Progress towards OT goals: Progressing toward goals  Acute Rehab OT Goals Patient Stated Goal: get stronger so I can go home OT Goal Formulation: With patient Time For Goal Achievement: 10/20/22 Potential to Achieve Goals: Good ADL Goals Pt Will Perform Grooming: with modified independence;standing Pt Will Perform Lower Body Dressing: with modified independence;sit to/from stand;with adaptive equipment Pt Will Transfer to Toilet: with modified independence;ambulating;regular height toilet  Plan Discharge plan needs to be updated;Frequency remains appropriate    Co-evaluation                 AM-PAC OT "6 Clicks" Daily  Activity     Outcome Measure   Help from another person eating meals?: None Help from another person taking care of personal grooming?: A Little Help from another person toileting, which includes using toliet, bedpan, or urinal?: A Little Help from another person bathing (including washing, rinsing, drying)?: A Lot Help from another person to put on and taking off regular upper body clothing?: A Little Help from another person to put on and taking off regular lower body clothing?: A Little 6 Click Score: 18    End of Session Equipment Utilized During Treatment: Gait belt;Rolling walker (2 wheels)  OT Visit Diagnosis: Unsteadiness on feet (R26.81)   Activity Tolerance Patient tolerated treatment well   Patient Left in bed;with family/visitor present;in chair   Nurse Communication Mobility status        Time: [id number] OT Time Calculation (min): 25 min  Charges: OT General Charges $OT Visit: 1 Visit OT Treatments $Self Care/Home Management : 8-22 mins $Therapeutic Activity: 8-22 mins  Jesse Sans OTR/L [place] Office: [phone]  Merri Ray [place] Barga 10/12/2022, 11:20 AM

## 2022-10-12 NOTE — Progress Notes (Signed)
Inpatient Rehab Admissions Coordinator:   Discussed treatment plan with Dr. Cruzita Lederer.  Will place consult order and f/u for full rehab assessment.   Shann Medal, PT, DPT Admissions Coordinator [phone] 10/12/22  1:22 PM

## 2022-10-12 NOTE — Progress Notes (Signed)
Daily Progress Note   Patient Name: Nicholas Burton       Date: 10/12/2022 DOB: 09/12/1927  Age: 89 y.o. MRN#: [id number] Attending Physician: Caren Griffins, MD Primary Care Physician: [name], MD Admit Date: 10/05/2022 Length of Stay: 7 days  Reason for Consultation/Follow-up: Establishing goals of care  HPI/Patient Profile:  89 year old gentleman with confusion and altered mental status multiple medical comorbidities possible encephalopathy with CT and MRI evidence of discitis osteomyelitis and a component of epidural abscess.    The palliative care team has been asked to get involved for further goals of care conversations.  Subjective:   Subjective: Chart Reviewed. Updates received. Patient Assessed. Created space and opportunity for patient  and family to explore thoughts and feelings regarding current medical situation.  Today's Discussion: Today saw the patient at the bedside, he was lying comfortably in bed.  I asked him how he is doing today and he states okay.  Denies pain, nausea, vomiting.  States he is eating pretty well.  He states that he was given an opinion about his situation yesterday and independent again today.  He prefers today's opinion as he feels that has more hope to it.    During our meeting the patient sons both came in.  We discussed multiple topics including potential rehab placement.  I explained that CIR is completing an evaluation.  I discussed the differences between inpatient rehab versus SNF/rehab versus home with home health rehab.  The goal is always for safety for the patient and to get him to a level that is appropriate for his needs and his ability to tolerate.  They understand.  We also discussed waiting for oncology and radiation oncology to make recommendations for possible treatment options.  At that point we can discuss what options are available and which sound best to them.  They verbalized understanding.  Until then we agreed to take it a  day at a time.  They shared that unfortunately their mother is in the emergency room right now.  She has had an upper respiratory issue and became Jakel of breath today.  However, they are hopeful that she will not need to stay because her oxygen came up substantially when she was placed on nasal cannula.  I provided emotional and general support through therapeutic listening, empathy, sharing of stories, and other techniques. I answered all questions and addressed all concerns to the best of my ability.  Review of Systems  Constitutional:        Denies pain in general  Respiratory:  Negative for cough, chest tightness and shortness of breath.   Cardiovascular:  Negative for chest pain.  Gastrointestinal:  Negative for abdominal pain, nausea and vomiting.    Objective:   Vital Signs:  BP 128/75 (BP Location: Right Arm)   Pulse 87   Temp 98.1 F (36.7 C) (Oral)   Resp 18   Ht 5' 9"$  (1.753 m)   Wt 77.1 kg   SpO2 96%   BMI 25.10 kg/m   Physical Exam: Physical Exam Vitals and nursing note reviewed.  Constitutional:      General: He is not in acute distress.    Comments: No pain in general  HENT:     Head: Normocephalic and atraumatic.     Right Ear: Decreased hearing noted.     Left Ear: Decreased hearing noted.  Cardiovascular:     Rate and Rhythm: Normal rate.  Pulmonary:     Effort: Pulmonary effort is  normal. No respiratory distress.     Breath sounds: No wheezing or rhonchi.  Abdominal:     General: Abdomen is flat. Bowel sounds are normal. There is no distension.     Palpations: Abdomen is soft.     Tenderness: There is no abdominal tenderness.  Skin:    General: Skin is warm and dry.  Neurological:     General: No focal deficit present.     Mental Status: He is alert.  Psychiatric:        Mood and Affect: Mood normal.        Behavior: Behavior normal.     Palliative Assessment/Data: 60-70%    Existing Vynca/ACP Documentation: None  Assessment &  Plan:   Impression: Present on Admission:  Acute encephalopathy  Lytic lesion of bone on x-ray  Hypokalemia  SIRS (systemic inflammatory response syndrome) (HCC)  Chronic diastolic CHF (congestive heart failure) (HCC)  Essential hypertension  HLD (hyperlipidemia)  Diskitis  SUMMARY OF RECOMMENDATIONS   Remain full code Full scope of care Await CIR evaluation Await recommendations from oncology and radiation oncology Planned further discussion and goals after options known Further recommendations to follow recommendations PMT will continue to follow  Symptom Management:  Tylenol 1 g 3 times daily Lidoderm patch (on 12 hours, off 12 hours) Ultram 25 mg p.o. every 12 hours as needed  Code Status: Full code  Prognosis: Unable to determine  Discharge Planning: To Be Determined  Discussed with: Patient, family, medical team, nursing team  Thank you for allowing [place] to participate in the care of EERO HEIMAN PMT will continue to support holistically.  Time Total: 45 min  Visit consisted of counseling and education dealing with the complex and emotionally intense issues of symptom management and palliative care in the setting of serious and potentially life-threatening illness. Greater than 50%  of this time was spent counseling and coordinating care related to the above assessment and plan.  Walden Field, NP Palliative Medicine Team  Team Phone # [phone] (Nights/Weekends)  04/27/2021, 8:17 AM

## 2022-10-12 NOTE — Progress Notes (Signed)
Addressed foley need with Dr. Cruzita Lederer, [place] to leave in and will review with oncology.

## 2022-10-12 NOTE — Progress Notes (Signed)
PROGRESS NOTE  Nicholas Burton [id number] DOB: 02-Mar-1928 DOA: 10/05/2022 PCP: [name], MD   LOS: 7 days   Brief Narrative / Interim history: This is a 89 year old male with history of dementia, HOH, DM 2, chronic diastolic CHF, HTN, HLD who was admitted with acute encephalopathy.  He has been having worsening lower back pain, new onset, as well as increased weakness in his legs.  Imaging on admission showed findings concerning for bone mets involving L2-L4.  Underwent biopsy and it showed metastatic carcinoma with suspected prostate source.  Oncology consulted  Subjective / 24h Interval events: Doing well this morning, complains of back pain, but overall feels well.  Son is at bedside and tells me patient is less confused than when he first came in  Assesement and Plan: Principal Problem:   Diskitis Active Problems:   DM2 (diabetes mellitus, type 2) ([place])   HLD (hyperlipidemia)   Essential hypertension   Chronic diastolic CHF (congestive heart failure) ([facility])   Acute encephalopathy   Lytic lesion of bone on x-ray   Hypokalemia   SIRS (systemic inflammatory response syndrome) ([place])  Principal problem Metastatic prostate cancer -with lytic destructive pattern of the lumbar vertebral bodies involving L2-L4.  There were initial concerns about this resenting osteomyelitis/discitis, ID consulted but this is less likely.  Oncology consulted, discussed with Dr. Irene Limbo this morning, he will get radiation oncology input to determine next steps  Active problems Acute metabolic encephalopathy -unclear etiology, possibly related to #1.  Overall improving.  Hypokalemia -continue to replete and monitor  Chronic diastolic CHF -most recent 2D echo shows LVEF 60-65%.  Appears euvolemic  AKI -due to urinary retention, Foley placed, continue.  Will need outpatient follow-up with urology.  Creatinine overall stable  Essential hypertension -continue metoprolol  DM2 -continue long-acting,  sliding scale  CBG (last 3)  Recent Labs    10/11/22 1605 10/11/22 1945 10/12/22 0755  GLUCAP 228* 188* 164*    Scheduled Meds:  acetaminophen  1,000 mg Oral TID   Chlorhexidine Gluconate Cloth  6 each Topical Daily   insulin aspart  0-15 Units Subcutaneous TID WC   insulin aspart  0-5 Units Subcutaneous QHS   insulin aspart  3 Units Subcutaneous TID WC   insulin glargine-yfgn  15 Units Subcutaneous QHS   lidocaine  1 patch Transdermal Q24H   metoprolol tartrate  25 mg Oral BID   polyethylene glycol  17 g Oral Daily   tamsulosin  0.4 mg Oral Daily   Continuous Infusions: PRN Meds:.fentaNYL (SUBLIMAZE) injection, melatonin, naLOXone (NARCAN)  injection, ondansetron (ZOFRAN) IV, traMADol  Current Outpatient Medications  Medication Instructions   atorvastatin (LIPITOR) 10 mg, Oral, Daily   cholecalciferol (VITAMIN D) 1,000 Units, Oral, Daily   Colesevelam HCl 3.75 g PACK MIX AND DRINK 1 PACKET DAILY   cyanocobalamin (VITAMIN B12) 500 mcg, Oral, Daily   Dietary Management Product (VASCULERA) TABS 1 tablet, Oral, Daily   empagliflozin (JARDIANCE) 10 mg, Oral, Daily before breakfast   HYDROcodone-acetaminophen (NORCO/VICODIN) 5-325 MG tablet 1 tablet, Oral, Every 6 hours PRN   LANTUS SOLOSTAR 100 UNIT/ML Solostar Pen INJECT 0.3ML (30 UNITS TOTAL) INTO THE SKIN AT BEDTIME   losartan (COZAAR) 25 mg, Oral, Daily   metFORMIN (GLUCOPHAGE) 500 MG tablet TAKE 1 TABLET TWICE A DAY WITH MEALS   methocarbamol (ROBAXIN) 500 mg, Oral, 2 times daily   metoprolol tartrate (LOPRESSOR) 25 MG tablet TAKE ONE-HALF (1/2) TABLET TWICE A DAY   Multiple Vitamins-Minerals (PRESERVISION/LUTEIN) CAPS 1 capsule, Oral,  2 times daily,     nabumetone (RELAFEN) 750 mg, Oral, 2 times daily PRN   torsemide (DEMADEX) 10 mg, Oral, Daily, Please call our office to schedule a yearly follow up for future refills. [phone]. Thank you (First attempt)   Vibegron (GEMTESA) 75 MG TABS TAKE 1 TABLET DAILY    Diet  Orders (From admission, onward)     Start     Ordered   10/07/22 1508  Diet regular Room service appropriate? Yes; Fluid consistency: Thin  Diet effective now       Question Answer Comment  Room service appropriate? Yes   Fluid consistency: Thin      10/07/22 1507            DVT prophylaxis: SCDs Start: 10/05/22 2322   Lab Results  Component Value Date   PLT 272 10/12/2022      Code Status: Full Code  Family Communication: son at bedside   Status is: Inpatient  Remains inpatient appropriate because: awaiting RadOnc input  Level of care: Med-Surg  Consultants:  Oncology Rad Onc  Objective: Vitals:   10/11/22 1346 10/11/22 2051 10/12/22 0629 10/12/22 0754  BP: 126/78 109/66 (!) 159/86 (!) 147/85  Pulse: 86 99 91 94  Resp: 18 19 18 18  $ Temp: 98.2 F (36.8 C) 98.3 F (36.8 C) 98 F (36.7 C) 97.9 F (36.6 C)  TempSrc: Oral Oral Oral Oral  SpO2: 99% 98% 98% 94%  Weight:      Height:        Intake/Output Summary (Last 24 hours) at 10/12/2022 [id number] Last data filed at 10/12/2022 0630 Gross per 24 hour  Intake --  Output 2800 ml  Net -2800 ml   Wt Readings from Last 3 Encounters:  10/11/22 77.1 kg  09/21/22 77.1 kg  09/07/22 85.7 kg    Examination:  Constitutional: NAD Eyes: no scleral icterus ENMT: Mucous membranes are moist.  Neck: normal, supple Respiratory: clear to auscultation bilaterally, no wheezing, no crackles. Normal respiratory effort. No accessory muscle use.  Cardiovascular: Regular rate and rhythm, no murmurs / rubs / gallops. No LE edema.  Abdomen: non distended, no tenderness. Bowel sounds positive.  Musculoskeletal: no clubbing / cyanosis.   Data Reviewed: I have independently reviewed following labs and imaging studies   CBC Recent Labs  Lab 10/05/22 2022 10/05/22 2032 10/06/22 0216 10/07/22 0326 10/08/22 0426 10/09/22 0341 10/10/22 0341 10/12/22 0257  WBC 13.8*  --  12.2* 15.1* 13.4* 10.8* 10.6* 9.5  HGB 12.9*   < >  12.5* 12.7* 11.9* 10.8* 10.7* 11.1*  HCT 39.1   < > 36.6* 39.0 36.7* 32.3* 33.1* 33.7*  PLT 201  --  182 194 210 191 224 272  MCV 88.3  --  86.1 89.0 89.5 88.7 89.2 88.2  MCH 29.1  --  29.4 29.0 29.0 29.7 28.8 29.1  MCHC 33.0  --  34.2 32.6 32.4 33.4 32.3 32.9  RDW 14.9  --  14.9 15.3 15.5 15.4 15.3 15.1  LYMPHSABS 3.3  --  2.7  --   --   --   --   --   MONOABS 1.9*  --  1.7*  --   --   --   --   --   EOSABS 0.2  --  0.1  --   --   --   --   --   BASOSABS 0.0  --  0.0  --   --   --   --   --    < > =  values in this interval not displayed.    Recent Labs  Lab 10/05/22 2022 10/05/22 2032 10/06/22 0204 10/06/22 0216 10/06/22 0830 10/07/22 0326 10/08/22 0426 10/09/22 0341 10/10/22 0341 10/11/22 0258 10/12/22 0257  NA 136   < >  --  137  --   --  138 138 137 138 136  K 3.3*   < >  --  3.3*  --   --  3.6 3.3* 3.5 3.5 3.6  CL 104  --   --  104  --   --  105 105 106 104 103  CO2 22  --   --  22  --   --  22 24 22 25 24  $ GLUCOSE 215*  --   --  187*  --   --  163* 111* 215* 96 172*  BUN 15  --   --  13  --   --  21 24* 24* 16 15  CREATININE 1.35*  --   --  1.22  --   --  1.93* 2.17* 1.81* 1.44* 1.49*  CALCIUM 8.8*  --   --  8.7*  --   --  8.4* 8.1* 8.0* 8.2* 8.1*  AST 24  --   --  23  --   --   --   --   --   --   --   ALT 21  --   --  19  --   --   --   --   --   --   --   ALKPHOS 135*  --   --  130*  --   --   --   --   --   --   --   BILITOT 0.8  --   --  0.8  --   --   --   --   --   --   --   ALBUMIN 3.3*  --   --  3.0*  --   --   --   --   --   --   --   MG 2.2  --   --  2.0  --   --   --   --   --   --   --   CRP  --   --   --   --  4.7*  --   --   --   --   --   --   PROCALCITON  --   --  <0.10  --   --   --   --   --   --   --   --   INR  --   --   --  1.1  --  1.1  --   --   --   --   --   TSH  --   --   --  3.821  --   --   --   --   --   --   --   AMMONIA <10  --   --   --   --   --   --   --   --   --   --    < > = values in this interval not displayed.     ------------------------------------------------------------------------------------------------------------------ No results for input(s): "CHOL", "HDL", "LDLCALC", "TRIG", "CHOLHDL", "LDLDIRECT" in the last 72 hours.  Lab Results  Component Value Date   HGBA1C 8.0 (H) 07/26/2022   ------------------------------------------------------------------------------------------------------------------  No results for input(s): "TSH", "T4TOTAL", "T3FREE", "THYROIDAB" in the last 72 hours.  Invalid input(s): "FREET3"  Cardiac Enzymes No results for input(s): "CKMB", "TROPONINI", "MYOGLOBIN" in the last 168 hours.  Invalid input(s): "CK" ------------------------------------------------------------------------------------------------------------------ No results found for: "BNP"  CBG: Recent Labs  Lab 10/11/22 0727 10/11/22 1104 10/11/22 1605 10/11/22 1945 10/12/22 0755  GLUCAP 224* 161* 228* 188* 164*    Recent Results (from the past 240 hour(s))  Resp panel by RT-PCR (RSV, Flu A&B, Covid) Anterior Nasal Swab     Status: None   Collection Time: 10/05/22 10:18 PM   Specimen: Anterior Nasal Swab  Result Value Ref Range Status   SARS Coronavirus 2 by RT PCR NEGATIVE NEGATIVE Final   Influenza A by PCR NEGATIVE NEGATIVE Final   Influenza B by PCR NEGATIVE NEGATIVE Final    Comment: (NOTE) The Xpert Xpress SARS-CoV-2/FLU/RSV plus assay is intended as an aid in the diagnosis of influenza from Nasopharyngeal swab specimens and should not be used as a sole basis for treatment. Nasal washings and aspirates are unacceptable for Xpert Xpress SARS-CoV-2/FLU/RSV testing.  Fact Sheet for Patients: EntrepreneurPulse.com.au  Fact Sheet for Healthcare Providers: IncredibleEmployment.be  This test is not yet approved or cleared by the [place] FDA and has been authorized for detection and/or diagnosis of SARS-CoV-2 by FDA under an Emergency Use  Authorization (EUA). This EUA will remain in effect (meaning this test can be used) for the duration of the COVID-19 declaration under Section 564(b)(1) of the Act, 21 U.S.C. section 360bbb-3(b)(1), unless the authorization is terminated or revoked.     Resp Syncytial Virus by PCR NEGATIVE NEGATIVE Final    Comment: (NOTE) Fact Sheet for Patients: EntrepreneurPulse.com.au  Fact Sheet for Healthcare Providers: IncredibleEmployment.be  This test is not yet approved or cleared by the [place] FDA and has been authorized for detection and/or diagnosis of SARS-CoV-2 by FDA under an Emergency Use Authorization (EUA). This EUA will remain in effect (meaning this test can be used) for the duration of the COVID-19 declaration under Section 564(b)(1) of the Act, 21 U.S.C. section 360bbb-3(b)(1), unless the authorization is terminated or revoked.  Performed at [facility], [place] [address] [place], [place] [address]   Culture, blood (single) w Reflex to ID Panel     Status: None   Collection Time: 10/06/22  8:30 AM   Specimen: BLOOD  Result Value Ref Range Status   Specimen Description BLOOD RIGHT ANTECUBITAL  Final   Special Requests   Final    BOTTLES DRAWN AEROBIC AND ANAEROBIC Blood Culture results may not be optimal due to an excessive volume of blood received in culture bottles   Culture   Final    NO GROWTH 5 DAYS Performed at [facility], [place] [address], [place], [place] [address]    Report Status 10/11/2022 FINAL  Final     Radiology Studies: CT CHEST ABDOMEN PELVIS WO CONTRAST  Result Date: 10/11/2022 CLINICAL DATA:  Pancreatic cancer staging EXAM: CT CHEST, ABDOMEN AND PELVIS WITHOUT CONTRAST TECHNIQUE: Multidetector CT imaging of the chest, abdomen and pelvis was performed following the standard protocol without IV contrast. RADIATION DOSE REDUCTION: This exam was performed according to the departmental dose-optimization  program which includes automated exposure control, adjustment of the mA and/or kV according to patient size and/or use of iterative reconstruction technique. COMPARISON:  PET-CT 10/05/2005. Lumbar spine CT 10/05/2022. MRI lumbar spine 10/05/2022. FINDINGS: CT CHEST FINDINGS Cardiovascular: No significant vascular findings. Normal heart size.  No pericardial effusion. Mediastinum/Nodes: No enlarged mediastinal, hilar, or axillary lymph nodes. Thyroid gland, trachea, and esophagus demonstrate no significant findings. Lungs/Pleura: Lungs are clear. No pleural effusion or pneumothorax. Musculoskeletal: No chest wall mass or suspicious bone lesions identified. Multilevel degenerative changes affect the spine. CT ABDOMEN PELVIS FINDINGS Hepatobiliary: No focal liver abnormality is seen. Status post cholecystectomy. No biliary dilatation. Pancreas: No pancreatic ductal dilatation or surrounding inflammatory changes. There is atrophy of the pancreatic body and tail. Spleen: Normal in size without focal abnormality. Adrenals/Urinary Tract: Bilateral kidneys and adrenal glands are within normal limits. Bladder is decompressed with Foley catheter. There is diffuse bladder wall thickening with surrounding inflammatory stranding. Stomach/Bowel: Stomach is within normal limits. Appendix appears normal. No evidence of bowel wall thickening, distention, or inflammatory changes. There is sigmoid and descending colon diverticulosis. Vascular/Lymphatic: Aortic atherosclerosis. No enlarged abdominal or pelvic lymph nodes. Duplicated IVC noted. Reproductive: Prostate gland is enlarged. Other: There is no ascites. There is a small fat containing right inguinal hernia. There is nonspecific presacral edema. Musculoskeletal: The bones are osteopenic. Severe degenerative changes affect the spine. There is mottled appearance of the L3 vertebral body with some subtle linear lucencies present in trace loss of vertebral body height. Paravertebral  edema and stranding again noted at this level. Findings are unchanged from prior and compatible with MRI findings of osteomyelitis discitis at L3-L4 with possible neoplastic process of the L3 vertebral body. IMPRESSION: 1. No evidence for metastatic disease in the chest, abdomen or pelvis. 2. Diffuse bladder wall thickening with surrounding inflammatory stranding worrisome for cystitis. 3. Stable abnormal appearance of the L3 which may represent osteomyelitis/discitis versus neoplastic process. 4. Nonspecific presacral edema. 5. Colonic diverticulosis. Aortic Atherosclerosis (ICD10-I70.0). Electronically Signed   By: Ronney Asters M.D.   On: 10/11/2022 17:49     Marzetta Board, MD, PhD [facility]  Between 7 am - 7 pm I am available, please contact me via Amion (for emergencies) or Securechat (non urgent messages)  Between 7 pm - 7 am I am not available, please contact night coverage MD/APP via Amion

## 2022-10-13 ENCOUNTER — Ambulatory Visit
Admission: RE | Admit: 2022-10-13 | Discharge: 2022-10-13 | Disposition: A | Discharge status: Home / Self Care | Payer: Medicare Other | Source: Ambulatory Visit | Attending: Radiation Oncology | Admitting: Radiation Oncology

## 2022-10-13 ENCOUNTER — Telehealth: Payer: Self-pay | Admitting: Hematology

## 2022-10-13 DIAGNOSIS — Z7189 Other specified counseling: Secondary | ICD-10-CM | POA: Diagnosis not present

## 2022-10-13 DIAGNOSIS — M545 Low back pain, unspecified: Secondary | ICD-10-CM | POA: Diagnosis not present

## 2022-10-13 DIAGNOSIS — F0390 Unspecified dementia without behavioral disturbance: Secondary | ICD-10-CM | POA: Diagnosis not present

## 2022-10-13 DIAGNOSIS — Z66 Do not resuscitate: Secondary | ICD-10-CM

## 2022-10-13 DIAGNOSIS — C61 Malignant neoplasm of prostate: Secondary | ICD-10-CM

## 2022-10-13 DIAGNOSIS — R9389 Abnormal findings on diagnostic imaging of other specified body structures: Secondary | ICD-10-CM | POA: Diagnosis not present

## 2022-10-13 DIAGNOSIS — R6510 Systemic inflammatory response syndrome (SIRS) of non-infectious origin without acute organ dysfunction: Secondary | ICD-10-CM | POA: Diagnosis not present

## 2022-10-13 DIAGNOSIS — Z515 Encounter for palliative care: Secondary | ICD-10-CM | POA: Diagnosis not present

## 2022-10-13 LAB — CBC
HCT: 35.4 % — ABNORMAL LOW (ref 39.0–52.0)
Hemoglobin: 11.5 g/dL — ABNORMAL LOW (ref 13.0–17.0)
MCH: 28.7 pg (ref 26.0–34.0)
MCHC: 32.5 g/dL (ref 30.0–36.0)
MCV: 88.3 fL (ref 80.0–100.0)
Platelets: 306 10*3/uL (ref 150–400)
RBC: 4.01 MIL/uL — ABNORMAL LOW (ref 4.22–5.81)
RDW: 14.9 % (ref 11.5–15.5)
WBC: 10.8 10*3/uL — ABNORMAL HIGH (ref 4.0–10.5)
nRBC: 0 % (ref 0.0–0.2)

## 2022-10-13 LAB — GLUCOSE, CAPILLARY
Glucose-Capillary: 137 mg/dL — ABNORMAL HIGH (ref 70–99)
Glucose-Capillary: 322 mg/dL — ABNORMAL HIGH (ref 70–99)
Glucose-Capillary: 123 mg/dL — ABNORMAL HIGH (ref 70–99)
Glucose-Capillary: 380 mg/dL — ABNORMAL HIGH (ref 70–99)

## 2022-10-13 LAB — COMPREHENSIVE METABOLIC PANEL
ALT: 35 U/L (ref 0–44)
AST: 33 U/L (ref 15–41)
Albumin: 2.1 g/dL — ABNORMAL LOW (ref 3.5–5.0)
Alkaline Phosphatase: 115 U/L (ref 38–126)
Anion gap: 10 (ref 5–15)
BUN: 17 mg/dL (ref 8–23)
CO2: 23 mmol/L (ref 22–32)
Calcium: 8.1 mg/dL — ABNORMAL LOW (ref 8.9–10.3)
Chloride: 105 mmol/L (ref 98–111)
Creatinine, Ser: 1.53 mg/dL — ABNORMAL HIGH (ref 0.61–1.24)
GFR, Estimated: 42 mL/min — ABNORMAL LOW (ref >60)
Glucose, Bld: 100 mg/dL — ABNORMAL HIGH (ref 70–99)
Potassium: 3.6 mmol/L (ref 3.5–5.1)
Sodium: 138 mmol/L (ref 135–145)
Total Bilirubin: 0.3 mg/dL (ref 0.3–1.2)
Total Protein: 5.2 g/dL — ABNORMAL LOW (ref 6.5–8.1)

## 2022-10-13 LAB — MAGNESIUM: Magnesium: 1.7 mg/dL (ref 1.7–2.4)

## 2022-10-13 MED ORDER — DEXAMETHASONE 4 MG PO TABS
4.0000 mg | ORAL_TABLET | Freq: Every day | ORAL | Status: DC
  Administered 2022-10-13 – 2022-10-14 (×2): 4 mg via ORAL
  Filled 2022-10-13 (×3): qty 1

## 2022-10-13 MED ORDER — NAPHAZOLINE-GLYCERIN 0.012-0.25 % OP SOLN
1.0000 [drp] | Freq: Four times a day (QID) | OPHTHALMIC | Status: DC | PRN
  Filled 2022-10-13: qty 15

## 2022-10-13 MED ORDER — HYDROCODONE-ACETAMINOPHEN 5-325 MG PO TABS
1.0000 | ORAL_TABLET | Freq: Four times a day (QID) | ORAL | Status: DC | PRN
  Administered 2022-10-13: 1 via ORAL
  Filled 2022-10-13: qty 1

## 2022-10-13 NOTE — PMR Pre-admission (Signed)
PMR Admission Coordinator Pre-Admission Assessment  Patient: Nicholas Burton is an 89 y.o., male MRN: [id number] DOB: 1928-06-27 Height: 5' 9"$  (175.3 cm) Weight: 77.1 kg  Insurance Information HMO:     PPO:      PCP:      IPA:      80/20:      OTHER:  PRIMARY: Medicare A/B      Policy#: [id number]      Subscriber: pt CM Name:       Phone#:      Fax#:  Pre-Cert#: verified Civil engineer, contracting:  Benefits:  Phone #:      Name:  Eff. Date: 11/28/1994 A/B     Deduct: $1632       Out of Pocket Max: n/a      Life Max: n/a CIR: 100%      SNF: 20 full days Outpatient: 80%     Co-Pay: 20% Home Health: 100%      Co-Pay:  DME: 80%     Co-Pay: 20% Providers:  SECONDARY: Tricare for Life      Policy#: [id number]     Phone#: [phone]  Financial Counselor:       Phone#:   The "Data Collection Information Summary" for patients in Inpatient Rehabilitation Facilities with attached "Privacy Act [place] Records" was provided and verbally reviewed with: Patient and Family  Emergency Contact Information Contact Information     Name Relation Home Work Nicholas Burton Spouse [phone]     Burton,Nicholas Son   [phone]   Burton, Nicholas Relative   [place] Relative   [phone]       Current Medical History  Patient Admitting Diagnosis: discitis/myelopathy 2/2 metastatic prostate cancer  History of Present Illness: Nicholas Burton. Worm is a 89 year old right-handed male with history of dementia, type 2 diabetes mellitus, chronic diastolic congestive heart failure, hypertension, hyperlipidemia, CKD stage III, obesity with BMI 25.10.  Per chart review patient lives with spouse.  1 level home with basement.  3 steps to entry.  He had been needing some assist with ADLs over the past couple of weeks because of low back pain using a rolling walker for mobility tasks.  Patient has very good family social support.  Wife recently admitted for COPD to the hospital.  Presented  10/05/2022 with altered mental status over the last couple of days as well as increasing low back pain and bilateral lower extremity weakness.  Cranial CT scan showed no acute changes.  Noted atrophy and chronic small vessel ischemic changes of the white matter.  CT/MRI lumbar spine showed findings concerning for osteomyelitis discitis at the L3-4 level.  Abnormal enhancing material within the ventral epidural space at the level of L3 concerning for epidural phlegmon superimposed on underlying spondylosis and facet arthrosis resultant severe spinal stenosis.  Surrounding paraspinous edema and enhancement, but with no drainable soft tissue collection.  Findings favoring to reflect infection possible neoplastic process involving L3 vertebral body.  Incidental noted markedly distended urinary bladder with associated bladder wall trabeculation.  CT of the chest abdomen pelvis showed no evidence for metastatic disease in the chest abdomen or pelvis.  Admission chemistries unremarkable except WBC 13,800, potassium 3.3 glucose 215 creatinine 1.35, ammonia level less than 10, alcohol 11, sedimentation rate of 5..  Underwent biopsy which showed metastatic poorly differentiated prostate cancer with oligo metastatic disease to L3.  Palliative care consulted to establish goals of care.  Plan currently is for XRT  x 2 weeks.  He currently remains on Decadron therapy as directed.  Currently with Foley tube in place for urinary retention plan to remain in place until follow-up outpatient with urology services.  Tolerating a regular diet.  Therapy evaluations completed due to patient decreased functional mobility was admitted for a comprehensive rehab program.  Complete NIHSS TOTAL: 3  [facility]'s medical record [facility] has been reviewed by the rehabilitation admission coordinator and physician.  Past Medical History  Past Medical History:  Diagnosis Date   Allergy    ANEMIA ASSOCIATED W/OTHER [facility] NUTRITIONAL DEFIC  08/11/2010   Qualifier: Diagnosis of  By: Ronnald Ramp MD, [name].    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    History of skin cancer    Hyperlipidemia with target LDL less than 100 11/10/2008        Hypertension    Memory loss 05/14/2009   Obesity (BMI 30.0-34.9) 04/01/2013   Osteoarthritis    PSA elevation 10/25/2018   SKIN CANCER, HX OF 11/10/2008   Qualifier: Diagnosis of  By: Ronnald Ramp MD, [name].    Snoring 04/03/2014   Type II diabetes mellitus with manifestations ([place]) 11/10/2008   Estimated Creatinine Clearance: 38 mL/min (A) (by C-G formula based on SCr of 1.52 mg/dL (H)).    Venous stasis dermatitis of both lower extremities 01/29/2018   Vitamin D deficiency 11/10/2008    Has the patient had major surgery during 100 days prior to admission? No  Family History   family history includes Cancer in his brother and sister; Diabetes in his mother, son, and another family member; Heart disease in his father and mother.  Current Medications  Current Facility-Administered Medications:    acetaminophen (TYLENOL) tablet 1,000 mg, 1,000 mg, Oral, TID, Ferolito, Freada Bergeron, NP, 1,000 mg at 10/14/22 0750   Chlorhexidine Gluconate Cloth 2 % PADS 6 each, 6 each, Topical, Daily, Eulogio Bear U, DO, 6 each at 10/14/22 0800   dexamethasone (DECADRON) tablet 4 mg, 4 mg, Oral, Daily, Hayden Pedro, PA-C, 4 mg at 10/14/22 0756   fentaNYL (SUBLIMAZE) injection 12.5 mcg, 12.5 mcg, Intravenous, Q2H PRN, Howerter, Justin B, DO   HYDROcodone-acetaminophen (NORCO/VICODIN) 5-325 MG per tablet 1 tablet, 1 tablet, Oral, Q6H PRN, Caren Griffins, MD, 1 tablet at  10/13/22 1133   insulin aspart (novoLOG) injection 0-15 Units, 0-15 Units, Subcutaneous, TID WC, Vann, Jessica U, DO, 5 Units at 10/14/22 0759   insulin aspart (novoLOG) injection 0-5 Units, 0-5 Units, Subcutaneous, QHS, Vann, Jessica U, DO, 5 Units at 10/13/22 2144   insulin aspart (novoLOG) injection 3 Units, 3 Units, Subcutaneous, TID WC, Vann, Jessica U, DO, 3 Units at 10/14/22 0757   insulin glargine-yfgn (SEMGLEE) injection 15 Units, 15 Units, Subcutaneous, QHS, Vann, Jessica U, DO, 15 Units at 10/13/22 2144   lidocaine (LIDODERM) 5 % 1 patch, 1 patch, Transdermal, Q24H, Ferolito, Freada Bergeron, NP, 1 patch at 10/14/22 0750   melatonin tablet 3 mg, 3 mg, Oral, QHS PRN, Howerter, Justin B, DO, 3 mg at 10/13/22 2145   metoprolol tartrate (LOPRESSOR) tablet 25 mg, 25  mg, Oral, BID, Eulogio Bear U, DO, 25 mg at 10/14/22 0749   naloxone (NARCAN) injection 0.4 mg, 0.4 mg, Intravenous, PRN, Howerter, Justin B, DO   naphazoline-glycerin (CLEAR EYES REDNESS) ophth solution 1-2 drop, 1-2 drop, Both Eyes, QID PRN, Cruzita Lederer, Vella Redhead, MD   ondansetron (ZOFRAN) injection 4 mg, 4 mg, Intravenous, Q6H PRN, Howerter, Justin B, DO   polyethylene glycol (MIRALAX / GLYCOLAX) packet 17 g, 17 g, Oral, Daily, Vann, Jessica U, DO, 17 g at 10/13/22 0857   tamsulosin (FLOMAX) capsule 0.4 mg, 0.4 mg, Oral, Daily, Vann, Jessica U, DO, 0.4 mg at 10/14/22 0749   traMADol (ULTRAM) tablet 25 mg, 25 mg, Oral, Q12H PRN, Drucilla Chalet, Freada Bergeron, NP  Patients Current Diet:  Diet Order             Diet regular Room service appropriate? Yes; Fluid consistency: Thin  Diet effective now                   Precautions / Restrictions Precautions Precautions: Fall Restrictions Weight Bearing Restrictions: No   Has the patient had 2 or more falls or a fall with injury in the past year? Yes  Prior Activity Level Household: Per son, not moving as much at home, RW for mobility, not driving, more assist for ADLs 2/2 back pain  over the last 2 weeks  Prior Functional Level Self Care: Did the patient need help bathing, dressing, using the toilet or eating? Independent  Indoor Mobility: Did the patient need assistance with walking from room to room (with or without device)? Independent  Stairs: Did the patient need assistance with internal or external stairs (with or without device)? Independent  Functional Cognition: Did the patient need help planning regular tasks such as shopping or remembering to take medications? Independent  Patient Information Are you of Hispanic, Latino/a,or Spanish origin?: A. No, not of Hispanic, Latino/a, or Spanish origin What is your race?: A. White Do you need or want an interpreter to communicate with a doctor or health care staff?: 0. No  Patient's Response To:  Health Literacy and Transportation Is the patient able to respond to health literacy and transportation needs?: No (dementia) Health Literacy - How often do you need to have someone help you when you read instructions, pamphlets, or other written material from your doctor or pharmacy?: Patient unable to respond In the past 12 months, has lack of transportation kept you from medical appointments or from getting medications?: No (per proxy) In the past 12 months, has lack of transportation kept you from meetings, work, or from getting things needed for daily living?: No (per proxy)  Home Assistive Devices / Equipment Home Equipment: Conservation officer, nature (2 wheels), [place] - single point, Adaptive equipment  Prior Device Use: Indicate devices/aids used by the patient prior to current illness, exacerbation or injury? Walker  Current Functional Level Cognition  Overall Cognitive Status: History of cognitive impairments - at baseline Orientation Level: Oriented to person, Oriented to place, Disoriented to situation, Disoriented to time General Comments: Very HOH    Extremity Assessment (includes Sensation/Coordination)  Upper  Extremity Assessment: Generalized weakness  Lower Extremity Assessment: Defer to PT evaluation    ADLs  Overall ADL's : Needs assistance/impaired Eating/Feeding: Independent, Sitting Grooming: Wash/dry hands, Standing, Min guard Grooming Details (indicate cue type and reason): sink level, vc for safety with RW Upper Body Dressing : Sitting, Minimal assistance Upper Body Dressing Details (indicate cue type and reason): donning gown on backside Lower Body Dressing:  Minimal assistance, Sit to/from stand Toilet Transfer: Min guard, Regular Toilet, Rolling walker (2 wheels), Ambulation Toilet Transfer Details (indicate cue type and reason): vc for use of grab bars for safety - safe hand placement with RW Toileting- Clothing Manipulation and Hygiene: Sitting/lateral lean, Min guard Toileting - Clothing Manipulation Details (indicate cue type and reason): vc for use of grab bar in lieu of RW Functional mobility during ADLs: Min guard, Rolling walker (2 wheels), Minimal assistance General ADL Comments: decreased activity tolerance, decreased balance    Mobility  Overal bed mobility: Needs Assistance Bed Mobility: Rolling, Sidelying to Sit Rolling: Mod assist Sidelying to sit: Min assist, Mod assist Sit to sidelying: Min guard General bed mobility comments: Pt in bathroom with son upon arrival    Transfers  Overall transfer level: Needs assistance Equipment used: Rolling walker (2 wheels) Transfers: Sit to/from Stand Sit to Stand: Min guard Bed to/from chair/wheelchair/BSC transfer type:: Step pivot Step pivot transfers: Min guard General transfer comment: Pt demonstrated safe hand placement. Min guard for safety from lower surface    Ambulation / Gait / Stairs / Wheelchair Mobility  Ambulation/Gait Ambulation/Gait assistance: Supervision Gait Distance (Feet): 190 Feet Assistive device: Rolling walker (2 wheels) Gait Pattern/deviations: Step-through pattern, Trunk flexed General Gait  Details: Cues for proximity to RW. Pt demonstrated step-through pattern with good cadance. Pt reported one occurance of L hip instability with no LOB. Gait velocity: Decreased Gait velocity interpretation: 1.31 - 2.62 ft/sec, indicative of limited community ambulator    Posture / Balance Dynamic Sitting Balance Sitting balance - Comments: can reach outside BOS without LOB Balance Overall balance assessment: Needs assistance Sitting-balance support: Feet supported, No upper extremity supported Sitting balance-Leahy Scale: Good Sitting balance - Comments: can reach outside BOS without LOB Standing balance support: Bilateral upper extremity supported, During functional activity Standing balance-Leahy Scale: Fair Standing balance comment: with RW support    Special needs/care consideration Diabetic management yes and Special service needs radiation scheduled   Previous Home Environment (from acute therapy documentation) Living Arrangements: Spouse/significant other Available Help at Discharge: Family, Available 24 hours/day Type of Home: House Home Layout: One level, Laundry or work area in basement, Able to live on main level with bedroom/bathroom Home Access: Stairs to enter Entrance Stairs-Rails: Can reach both Entrance Stairs-Number of Steps: 3 Bathroom Shower/Tub: Public librarian, Multimedia programmer: Standard Bathroom Accessibility: Yes How Accessible: Accessible via walker  Discharge Living Setting Plans for Discharge Living Setting: Patient's home, Lives with (comment) (spouse and great grand daughter) Type of Home at Discharge: House Discharge Home Layout: One level Discharge Home Access: Stairs to enter Entrance Stairs-Rails: Can reach both Entrance Stairs-Number of Steps: 3 Discharge Bathroom Shower/Tub: Tub/shower unit, Walk-in shower Discharge Bathroom Toilet: Standard Discharge Bathroom Accessibility: Yes How Accessible: Accessible via walker Does the  patient have any problems obtaining your medications?: No  Social/Family/Support Systems Anticipated Caregiver: son Vicente Serene is point of contact.  GGD Anderson [place] lives with pt and his spouse.  Numerous family members will be providing assist Anticipated Caregiver's Contact Information: Buell L. Waltz [phone] (lives in [place]); Vicente Serene [phone] Ability/Limitations of Caregiver: n/a Caregiver Availability: 24/7 Discharge Plan Discussed with Primary Caregiver: Yes Is Caregiver In Agreement with Plan?: Yes Does Caregiver/Family have Issues with Lodging/Transportation while Pt is in Rehab?: No  Goals Patient/Family Goal for Rehab: PT/OT supervision, SLP n/a Expected length of stay: 7-10 days Additional Information: New dx of metastatic prostate cancer, plan for palliative radiation starting 2/19.  Dispo: home to pt's home  with spouse and GGD living in the home, numerous family members (4 sons and multiple grands/great grands) to provide additional support Pt/Family Agrees to Admission and willing to participate: Yes Program Orientation Provided & Reviewed with Pt/Caregiver Including Roles  & Responsibilities: Yes Additional Information Needs: f/u with Dr. Irene Limbo and Dr. Lisbeth Renshaw for oncology/radiation plans Information Needs to be Provided By: CSW  Barriers to Discharge: Medical stability, Home environment access/layout, Pending chemo/radiation  Decrease burden of Care through IP rehab admission: n/a  Possible need for SNF placement upon discharge: not anticipated.  Plan discharge home to pt's house where he will have his spouse and his great grand daughter 24/7, as well as numerous children/grand children providing additional support PRN.   Patient Condition: I have reviewed medical records from [facility] ([facility]), spoken with CM, and patient and son. I met with patient at the bedside for inpatient rehabilitation assessment.  Patient will benefit from ongoing PT and OT, can actively participate in 3  hours of therapy a day 5 days of the week, and can make measurable gains during the admission.  Patient will also benefit from the coordinated team approach during an Inpatient Acute Rehabilitation admission.  The patient will receive intensive therapy as well as Rehabilitation physician, nursing, social worker, and care management interventions.  Due to bladder management, bowel management, safety, skin/wound care, disease management, medication administration, pain management, and patient education the patient requires 24 hour a day rehabilitation nursing.  The patient is currently min assist with mobility and basic ADLs.  Discharge setting and therapy post discharge at home with home health is anticipated.  Patient has agreed to participate in the Acute Inpatient Rehabilitation Program and will admit today.  Preadmission Screen Completed By:  Michel Santee, PT, DPT 10/14/2022 10:17 AM ______________________________________________________________________   Discussed status with Dr. Tressa Busman on 10/14/22  at 10:17 AM  and received approval for admission today.  Admission Coordinator:  Michel Santee, PT, DPT time 10:17 AM Sudie Grumbling 10/14/22    Assessment/Plan: Diagnosis: Does the need for close, 24 hr/day Medical supervision in concert with the patient's rehab needs make it unreasonable for this patient to be served in a less intensive setting? Yes Co-Morbidities requiring supervision/potential complications: metastatic prostate cancer with lyric destruction of L2-4, ?osteomyelitis s/p abx, hypokalemia, diastolic CHF, AKI with urinary retention, hypertension, DM2, memory loss.  Due to bladder management, bowel management, safety, skin/wound care, disease management, medication administration, pain management, and patient education, does the patient require 24 hr/day rehab nursing? Yes Does the patient require coordinated care of a physician, rehab nurse, PT, OT, and SLP to address physical and functional  deficits in the context of the above medical diagnosis(es)? Yes Addressing deficits in the following areas: balance, endurance, locomotion, strength, transferring, bowel/bladder control, bathing, dressing, feeding, grooming, toileting, cognition, and psychosocial support Can the patient actively participate in an intensive therapy program of at least 3 hrs of therapy 5 days a week? Yes The potential for patient to make measurable gains while on inpatient rehab is good Anticipated functional outcomes upon discharge from inpatient rehab: supervision PT, supervision OT Estimated rehab length of stay to reach the above functional goals is: 7-10 days Anticipated discharge destination:  Home with family vs. Nursing home 10. Overall Rehab/Functional Prognosis: good   MD Signature:  Gertie Gowda, DO 10/14/2022

## 2022-10-13 NOTE — Care Management Important Message (Signed)
Important Message  Patient Details  Name: Nicholas Burton MRN: [id number] Date of Birth: April 01, 1928   Medicare Important Message Given:  Yes     Hannah Beat 10/13/2022, 2:32 PM

## 2022-10-13 NOTE — Progress Notes (Addendum)
Inpatient Rehab Admissions Coordinator:   Stopped by to see pt.  Off site for radiation simulation.  I left a message for pt's son, Nicholas Burton, to discuss goals/expectations of CIR. I will follow.   1530: Met with patient and his son, Nicholas Burton, at the bedside.  Discussed CIR 3 hrs/day with ELOS 2 weeks and goals of supervision.  Nicholas Burton reports that pt has a spouse, 4 sons, and numerous grand and great-grand children.  Nicholas Burton (great grand child) has moved in with patient and spouse 2 weeks ago to assist with supervision/care, and the remainder of the family also helps.  We discussed plan for CIR with therapy in the AM and radiation in the PM, starting Monday.  We discussed Medicare benefits.  I will plan for admit tomorrow.    Shann Medal, PT, DPT Admissions Coordinator [phone] 10/13/22  1:55 PM

## 2022-10-13 NOTE — Inpatient Diabetes Management (Signed)
Inpatient Diabetes Program Recommendations  AACE/ADA: New Consensus Statement on Inpatient Glycemic Control (2015)  Target Ranges:  Prepandial:   less than 140 mg/dL      Peak postprandial:   less than 180 mg/dL (1-2 hours)      Critically ill patients:  140 - 180 mg/dL   Lab Results  Component Value Date   GLUCAP 322 (H) 10/13/2022   HGBA1C 8.0 (H) 07/26/2022    Review of Glycemic Control  Latest Reference Range & Units 10/12/22 18:07 10/12/22 20:45 10/13/22 07:37 10/13/22 11:21  Glucose-Capillary 70 - 99 mg/dL 91 324 (H) 137 (H) 322 (H)  (H): Data is abnormally high Diabetes history: Type 2 DM Outpatient Diabetes medications: Lantus 30 units QHS, Metformin 500 mg BID, Jardiance 10 mg QD Current orders for Inpatient glycemic control: Semglee 15 units QHS, Novolog 3 units TID, Novolog 0-15 units TID & HS Decadron 4 mg QD  Inpatient Diabetes Program Recommendations:    With the start of steroids, consider increasing meal coverage to Novolog 5 units TID (assuming patient is consuming >50% of meals).  Thanks, Bronson Curb, MSN, RNC-OB Diabetes Coordinator [phone] (8a-5p)

## 2022-10-13 NOTE — Consult Note (Signed)
Radiation Oncology         [phone] ________________________________  Name: Nicholas Burton        MRN: :[id number]  Date of Service:  10/12/22 DOB: April 29, 1928  [id number], [name], MD    REFERRING PHYSICIAN: Dr. Renne Crigler   DIAGNOSIS: The primary encounter diagnosis was Acute midline low back pain without sciatica. Diagnoses of Dementia, unspecified dementia severity, unspecified dementia type, unspecified whether behavioral, psychotic, or mood disturbance or anxiety (HCC) and Abnormal CT scan were also pertinent to this visit.   HISTORY OF PRESENT ILLNESS: Nicholas Burton is a 89 y.o. male seen at the request of Dr. Letta Median with [facility]. The patient was brought to the emergency department due to an episode of confusion he was evaluated and the head on 10/05/2022 showed no acute findings to explain his symptoms, he had been experiencing low back pain and a CT scan of the lumbar spine without contrast that day also showed a lytic lesion in the L3 vertebral body.  He underwent an MRI scan of the spine with and without contrast which showed inflammatory changes of the L3-L4 vertebral level with enhancing material in the ventral epidural space at the level of L3 concerning for epidural phlegmon, initially it was felt that these favored infection, but surrounding edema was also noted.  He had a distended urinary bladder with wall trabeculation and/or diverticula, he then underwent a CT chest abdomen pelvis on 10/11/2022 that showed no evidence of organ metastases but stable changes seen again in the L3 representing neoplastic versus infectious process.  Nonspecific presacral edema was noted as well as diverticulosis and atherosclerotic disease.  He did undergo a biopsy however on 10/07/2022 of the L3 vertebral body which showed metastatic carcinoma consistent with a prostate cancer primary. Given these findings, he is contacted to discuss palliative radiotherapy.  Of note his PSA has been drawn on  10/11/2021 and is still in process.    PREVIOUS RADIATION THERAPY: No   PAST MEDICAL HISTORY:  Past Medical History:  Diagnosis Date   Allergy    ANEMIA ASSOCIATED W/OTHER [facility] NUTRITIONAL DEFIC 08/11/2010   Qualifier: Diagnosis of  By: Ronnald Ramp MD, [name].    Cancer [facility])    skin   Cataract    Chronic combined systolic and diastolic congestive heart failure ([place]) 10/30/2018   COPD with asthma 03/02/2017   CRI (chronic renal insufficiency), stage 3 (moderate) (HCC) 10/25/2018   Deficiency anemia 10/25/2018   Diabetes mellitus    type 2   DOE (dyspnea on exertion) 09/03/2018   Edema 06/11/2009   Qualifier: Diagnosis of  By: Ronnald Ramp MD, [name].    History of skin cancer    Hyperlipidemia with target LDL less than 100 11/10/2008        Hypertension    Memory loss 05/14/2009   Obesity (BMI 30.0-34.9) 04/01/2013   Osteoarthritis    PSA elevation 10/25/2018   SKIN CANCER, HX OF 11/10/2008   Qualifier: Diagnosis of  By: Ronnald Ramp MD, [name].    Snoring 04/03/2014   Type II diabetes mellitus with manifestations ([place]) 11/10/2008   Estimated Creatinine Clearance: 38 mL/min (A) (by C-G formula based on SCr of 1.52 mg/dL (H)).    Venous stasis dermatitis of both lower extremities 01/29/2018   Vitamin D deficiency 11/10/2008       PAST SURGICAL HISTORY: Past Surgical History:  Procedure Laterality Date   CHOLECYSTECTOMY     INGUINAL HERNIA REPAIR     x  3   IR FLUORO GUIDED NEEDLE PLC ASPIRATION/INJECTION LOC  10/07/2022   JOINT REPLACEMENT     KNEE SURGERY     x 2   MELANOMA EXCISION     x 3 -- Left arm   TOTAL KNEE ARTHROPLASTY     x 3     FAMILY HISTORY:  Family History  Problem Relation Age of Onset   Heart disease Mother    Diabetes Mother    Heart disease Father    Cancer Sister    Cancer Brother    Diabetes Son    Diabetes Other      SOCIAL HISTORY:  reports that he has never smoked. He has never used smokeless tobacco. He reports that he does not drink alcohol and does not  use drugs. The patient is married and has three adult sons. His wife is a former patient of Dr. Ida Rogue as well.    ALLERGIES: Ace inhibitors, Oxycodone, and Penicillins   MEDICATIONS:  Current Facility-Administered Medications  Medication Dose Route Frequency Provider Last Rate Last Admin   acetaminophen (TYLENOL) tablet 1,000 mg  1,000 mg Oral TID Rosezella Rumpf, NP   1,000 mg at 10/12/22 2203   Chlorhexidine Gluconate Cloth 2 % PADS 6 each  6 each Topical Daily Eulogio Bear U, DO   6 each at 10/12/22 1130   fentaNYL (SUBLIMAZE) injection 12.5 mcg  12.5 mcg Intravenous Q2H PRN Howerter, Justin B, DO       insulin aspart (novoLOG) injection 0-15 Units  0-15 Units Subcutaneous TID WC Vann, Jessica U, DO   5 Units at 10/12/22 1258   insulin aspart (novoLOG) injection 0-5 Units  0-5 Units Subcutaneous QHS Eulogio Bear U, DO   4 Units at 10/12/22 2202   insulin aspart (novoLOG) injection 3 Units  3 Units Subcutaneous TID WC Eulogio Bear U, DO   3 Units at 10/12/22 1258   insulin glargine-yfgn (SEMGLEE) injection 15 Units  15 Units Subcutaneous QHS Eulogio Bear U, DO   15 Units at 10/12/22 2202   lidocaine (LIDODERM) 5 % 1 patch  1 patch Transdermal Q24H Rosezella Rumpf, NP   1 patch at 10/12/22 0905   melatonin tablet 3 mg  3 mg Oral QHS PRN Howerter, Justin B, DO   3 mg at 10/12/22 2204   metoprolol tartrate (LOPRESSOR) tablet 25 mg  25 mg Oral BID Eulogio Bear U, DO   25 mg at 10/12/22 2203   naloxone [facility]) injection 0.4 mg  0.4 mg Intravenous PRN Howerter, Justin B, DO       ondansetron (ZOFRAN) injection 4 mg  4 mg Intravenous Q6H PRN Howerter, Justin B, DO       polyethylene glycol (MIRALAX / GLYCOLAX) packet 17 g  17 g Oral Daily Eulogio Bear U, DO   17 g at 10/12/22 [id number]   tamsulosin (FLOMAX) capsule 0.4 mg  0.4 mg Oral Daily Eulogio Bear U, DO   0.4 mg at 10/12/22 [id number]   traMADol (ULTRAM) tablet 25 mg  25 mg Oral Q12H PRN Rosezella Rumpf, NP         REVIEW OF  SYSTEMS: On review of systems, the patient has been having low back pain for the last few weeks but is not having loss of sensation or difficulty ambulating. He has felt some weakness however in the LLE. No saddle anesthesia or symptoms of urinary retention were noted initially at presentation. No other complaints are verbalized.   PHYSICAL EXAM:  Wt Readings from Last 3 Encounters:  10/11/22 169 lb 15.6 oz (77.1 kg)  09/21/22 170 lb (77.1 kg)  09/07/22 189 lb (85.7 kg)   Temp Readings from Last 3 Encounters:  10/13/22 98.3 F (36.8 C) (Oral)  09/28/22 (!) 97 F (36.1 C) (Temporal)  09/21/22 98.1 F (36.7 C) (Oral)   BP Readings from Last 3 Encounters:  10/13/22 (!) 142/75  09/28/22 134/70  09/21/22 118/62   Pulse Readings from Last 3 Encounters:  10/13/22 89  09/28/22 72  09/21/22 77   Pain Assessment Pain Score: 0-No pain/10  Unable to assess due to encounter type.    ECOG = 2  0 - Asymptomatic (Fully active, able to carry on all predisease activities without restriction)  1 - Symptomatic but completely ambulatory (Restricted in physically strenuous activity but ambulatory and able to carry out work of a light or sedentary nature. For example, light housework, office work)  2 - Symptomatic, <50% in bed during the day (Ambulatory and capable of all self care but unable to carry out any work activities. Up and about more than 50% of waking hours)  3 - Symptomatic, >50% in bed, but not bedbound (Capable of only limited self-care, confined to bed or chair 50% or more of waking hours)  4 - Bedbound (Completely disabled. Cannot carry on any self-care. Totally confined to bed or chair)  5 - Death   Eustace Pen MM, Creech RH, Tormey DC, et al. [phone]). "Toxicity and response criteria of the [facility]". [place] Oncol. 5 (6): 649-55    LABORATORY DATA:  Lab Results  Component Value Date   WBC 10.8 (H) 10/13/2022   HGB 11.5 (L) 10/13/2022   HCT  35.4 (L) 10/13/2022   MCV 88.3 10/13/2022   PLT 306 10/13/2022   Lab Results  Component Value Date   NA 138 10/13/2022   K 3.6 10/13/2022   CL 105 10/13/2022   CO2 23 10/13/2022   Lab Results  Component Value Date   ALT 35 10/13/2022   AST 33 10/13/2022   ALKPHOS 115 10/13/2022   BILITOT 0.3 10/13/2022      RADIOGRAPHY: CT CHEST ABDOMEN PELVIS WO CONTRAST  Result Date: 10/11/2022 CLINICAL DATA:  Pancreatic cancer staging EXAM: CT CHEST, ABDOMEN AND PELVIS WITHOUT CONTRAST TECHNIQUE: Multidetector CT imaging of the chest, abdomen and pelvis was performed following the standard protocol without IV contrast. RADIATION DOSE REDUCTION: This exam was performed according to the departmental dose-optimization program which includes automated exposure control, adjustment of the mA and/or kV according to patient size and/or use of iterative reconstruction technique. COMPARISON:  PET-CT 10/05/2005. Lumbar spine CT 10/05/2022. MRI lumbar spine 10/05/2022. FINDINGS: CT CHEST FINDINGS Cardiovascular: No significant vascular findings. Normal heart size. No pericardial effusion. Mediastinum/Nodes: No enlarged mediastinal, hilar, or axillary lymph nodes. Thyroid gland, trachea, and esophagus demonstrate no significant findings. Lungs/Pleura: Lungs are clear. No pleural effusion or pneumothorax. Musculoskeletal: No chest wall mass or suspicious bone lesions identified. Multilevel degenerative changes affect the spine. CT ABDOMEN PELVIS FINDINGS Hepatobiliary: No focal liver abnormality is seen. Status post cholecystectomy. No biliary dilatation. Pancreas: No pancreatic ductal dilatation or surrounding inflammatory changes. There is atrophy of the pancreatic body and tail. Spleen: Normal in size without focal abnormality. Adrenals/Urinary Tract: Bilateral kidneys and adrenal glands are within normal limits. Bladder is decompressed with Foley catheter. There is diffuse bladder wall thickening with surrounding  inflammatory stranding. Stomach/Bowel: Stomach is within normal limits. Appendix appears normal. No evidence  of bowel wall thickening, distention, or inflammatory changes. There is sigmoid and descending colon diverticulosis. Vascular/Lymphatic: Aortic atherosclerosis. No enlarged abdominal or pelvic lymph nodes. Duplicated IVC noted. Reproductive: Prostate gland is enlarged. Other: There is no ascites. There is a small fat containing right inguinal hernia. There is nonspecific presacral edema. Musculoskeletal: The bones are osteopenic. Severe degenerative changes affect the spine. There is mottled appearance of the L3 vertebral body with some subtle linear lucencies present in trace loss of vertebral body height. Paravertebral edema and stranding again noted at this level. Findings are unchanged from prior and compatible with MRI findings of osteomyelitis discitis at L3-L4 with possible neoplastic process of the L3 vertebral body. IMPRESSION: 1. No evidence for metastatic disease in the chest, abdomen or pelvis. 2. Diffuse bladder wall thickening with surrounding inflammatory stranding worrisome for cystitis. 3. Stable abnormal appearance of the L3 which may represent osteomyelitis/discitis versus neoplastic process. 4. Nonspecific presacral edema. 5. Colonic diverticulosis. Aortic Atherosclerosis (ICD10-I70.0). Electronically Signed   By: Ronney Asters M.D.   On: 10/11/2022 17:49   IR Fluoro Guide Ndl Plmt / BX  Result Date: 10/09/2022 INDICATION: Low back pain secondary to osteomyelitis/discitis at L3-L4. EXAM: FLUOROSCOPIC GUIDED CORE BIOPSY AT L3 MEDICATIONS: None. ANESTHESIA/SEDATION: Moderate (conscious) sedation was employed during this procedure. A total of Versed 1 mg and Fentanyl 25 mcg was administered intravenously by the radiology nurse. Total intra-service moderate Sedation Time: 23 minutes. The patient's level of consciousness and vital signs were monitored continuously by radiology nursing  throughout the procedure under my direct supervision. COMPLICATIONS: None immediate. PROCEDURE: Informed written consent was obtained from the patient after a thorough discussion of the procedural risks, benefits and alternatives. All questions were addressed. Maximal Sterile Barrier Technique was utilized including caps, mask, sterile gowns, sterile gloves, sterile drape, hand hygiene and skin antiseptic. A timeout was performed prior to the initiation of the procedure. Patient was laid prone on the fluoroscopic table. Skin overlying the lumbar region was then prepped and draped in the usual manner. The right pedicle at L3 was then identified, and the skin entry site was then infiltrated with 0.25% bupivacaine and carried to the pedicle itself. Under intermittent biplane fluoroscopy, a 13 gauge Cook spinal needle was then advanced to the posterior 1/3 at L3. Two passes were then made with a 16 gauge core biopsy needle. Biopsy material was then obtained with a 20 mL syringe and sent for analysis as per requesting MD. The 13 gauge Cook spinal needle was removed. Hemostasis was achieved at the skin entry site. Patient tolerated the procedure well. IMPRESSION: Status post fluoroscopic guided core biopsy at L3 as described. Electronically Signed   By: Luanne Bras M.D.   On: 10/09/2022 09:59   MR Lumbar Spine W Wo Contrast  Result Date: 10/06/2022 CLINICAL DATA:  Initial evaluation for low back pain, infection. EXAM: MRI LUMBAR SPINE WITHOUT AND WITH CONTRAST TECHNIQUE: Multiplanar and multiecho pulse sequences of the lumbar spine were obtained without and with intravenous contrast. CONTRAST:  7.79m GADAVIST GADOBUTROL 1 MMOL/ML IV SOLN COMPARISON:  CT from earlier the same day. FINDINGS: Segmentation:  Examination degraded by motion artifact. Standard segmentation. Lowest well-formed disc space labeled the L5-S1 level. Alignment:  Moderate levoscoliosis.  No significant listhesis. Vertebrae: Abnormal T1  hypointensity, with heterogeneous STIR hyperintensity and enhancement seen within the L3 vertebral body. Edema and enhancement noted within the adjacent L3-4 interspace as well as about the superior endplate of L4. Findings are nonspecific, but most concerning for possible  osteomyelitis discitis. Surrounding paraspinous edema and inflammatory changes. No soft tissue collections. Abnormal enhancing material within the ventral epidural space at the level of L3 measures approximately 2.9 x 1.4 cm (series 8, image 19). Findings concerning for epidural phlegmon. 1.6 cm nonenhancing material at the level could reflect a small epidural abscess versus disc protrusion (series 8, image 17). Ventral epidural enhancement extends from the L1-2 through L4-5 interspace. While these findings are favored to be infectious/inflammatory in nature, a possible underlying neoplastic process involving the L3 vertebral body remains difficult to exclude. No other convincing evidence for acute infection elsewhere within the lumbar spine. Reactive endplate changes about the L1-2, L2-3, and L5-S1 interspaces favored to be degenerative. Mild edema and enhancement about the L2-3 and L3-4 facets also favored to be degenerative. Underlying bone marrow signal intensity diffusely heterogeneous. No other worrisome osseous lesions. Conus medullaris and cauda equina: Conus extends to the T12 level. Conus and cauda equina appear normal. Paraspinal and other soft tissues: Paraspinous edema and enhancement adjacent to the L3-4 interspace as above. No collections. Partially visualized urinary bladder is markedly distended with associated bladder wall trabeculation and/or diverticula. Mild bilateral hydroureteronephrosis, suspected to be related to bladder distension. Disc levels: L1-2: Advanced degenerative intervertebral disc space narrowing with diffuse disc bulge and disc desiccation. Reactive endplate spurring. Moderate right worse than left facet and  ligament flavum hypertrophy. No significant spinal stenosis. Foramina remain patent. L2-3: Moderate degenerative intervertebral disc space narrowing with diffuse disc bulge, asymmetric to the left. Associated reactive endplate spurring. 1.6 cm nonenhancing material within the right ventral epidural space could reflect a right subarticular disc protrusion or possibly epidural abscess (series 8, image 17). Moderate facet and ligament flavum hypertrophy. Resultant mild canal with moderate left lateral recess stenosis. Moderate left L2 foraminal narrowing. Right neural foramen remains patent. L3-4: Advanced intervertebral disc space narrowing with findings concerning for osteomyelitis discitis as above. Probable epidural phlegmon within the ventral epidural space at the level of L3 (series 8, image 19). Moderate left worse than right facet arthrosis. Resultant severe spinal stenosis. Moderate to severe left with mild right L3 foraminal narrowing. L4-5: Degenerative intervertebral disc space narrowing with disc desiccation and diffuse disc bulge. Left-sided reactive endplate spurring. Moderate facet hypertrophy. Resultant mild narrowing of the left lateral recess. Central canal remains patent. Mild to moderate left L4 foraminal narrowing. Right neural foramen remains patent. L5-S1: Degenerative intervertebral disc space narrowing with disc desiccation and diffuse disc bulge. Reactive endplate spurring. Moderate bilateral facet arthrosis. No significant spinal stenosis. Mild bilateral L5 foraminal narrowing. IMPRESSION: 1. Findings concerning for osteomyelitis discitis at the L3-4 level. Abnormal enhancing material within the ventral epidural space at the level of L3, concerning for epidural phlegmon. These changes superimposed on underlying spondylosis and facet arthrosis resultant severe spinal stenosis. While these changes are favored to reflect infection, a possible neoplastic process involving the L3 vertebral body  remains difficult to exclude. Iten interval follow-up imaging may be helpful for further evaluation as warranted. 2. Surrounding paraspinous edema and enhancement, but with no drainable soft tissue collection collections. 3. Underlying advanced multilevel degenerative spondylosis and facet arthrosis as detailed above. No other high-grade spinal stenosis 4. Markedly distended urinary bladder with associated bladder wall trabeculation and/or diverticula. Clinical correlation for chronic outlet obstruction recommended. Mild bilateral hydroureteronephrosis, suspected to be related to bladder distension. Electronically Signed   By: Jeannine Boga M.D.   On: 10/06/2022 00:47   CT Lumbar Spine Wo Contrast  Result Date: 10/05/2022 CLINICAL DATA:  Low back pain.  Increased fracture risk. EXAM: CT LUMBAR SPINE WITHOUT CONTRAST TECHNIQUE: Multidetector CT imaging of the lumbar spine was performed without intravenous contrast administration. Multiplanar CT image reconstructions were also generated. RADIATION DOSE REDUCTION: This exam was performed according to the departmental dose-optimization program which includes automated exposure control, adjustment of the mA and/or kV according to patient size and/or use of iterative reconstruction technique. COMPARISON:  Radiography 07/26/2022 FINDINGS: Segmentation: 5 lumbar type vertebral bodies. Alignment: Chronic mild S shaped scoliotic curvature. Vertebrae: Chronic degenerative endplate changes at the L1-2 level. Newly seen lytic destructive pattern throughout the L3 vertebral body, worse towards the left side. Question some involvement of the L2 vertebral body and superior aspect of the L4 vertebral body. Lucent focus within the central portion of the S1 vertebral body. These findings could be due to metastatic disease, infection or both. See below. Paraspinal and other soft tissues: Distended bladder. Aortic atherosclerosis. Pronounced edematous change in the soft  tissues surrounding the L3 vertebral body, worrisome in particular for infection but possibly consistent with tumor as well. Disc levels: T9-10 through T12-L1: Negative L1-2: Chronic disc degeneration with loss of disc height. Endplate osteophytes and mild bulging of the disc. No compressive stenosis. L2-3: Disc space narrowing and vacuum phenomenon. Circumferential bulging of the disc. Facet and ligamentous hypertrophy. Multifactorial stenosis at this level that could cause neural compression. As noted above, there may be some lytic changes of the inferior L2 vertebral body. This raises the possibility of discitis osteomyelitis involving this level. L3-4: Lytic destructive appearance of the L3 vertebral body with loss of height more on the left. Edematous change of the paravertebral soft tissues. Bulging of the disc. Facet and ligamentous hypertrophy. Severe stenosis at this level. Epidural abscess not excluded. As noted above, there could be lesser destructive change of superior L4. Certainly the findings at this level or worrisome for discitis osteomyelitis. L4-5: Chronic disc degeneration with loss of disc height. Bulging of the disc. Facet and ligamentous hypertrophy. Moderate degenerative type stenosis. L5-S1: Chronic disc degeneration with loss of disc height. Endplate osteophytes and bulging of the disc. Facet degeneration. No canal stenosis. Mild bilateral foraminal stenosis. Lucent area within the S1 vertebral body which is indeterminate. IMPRESSION: 1. Lytic destructive pattern throughout the L3 vertebral body with loss of height more on the left. Question some involvement of the L2 vertebral body and superior aspect of the L4 vertebral body. Pronounced edematous change in the paravertebral soft tissues surrounding the L3 vertebral body, worrisome for infection but possibly consistent with tumor as well. 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 Severe stenosis at the L2-3 and  L3-4 levels, possibly resulting in neurogenic bladder. Epidural abscess is not excluded. Is the patient an MR candidate? 2. Lucent area within the S1 vertebral body which is indeterminate. 3. Chronic degenerative changes at L1-2, L4-5 and L5-S1 as above. Aortic Atherosclerosis (ICD10-I70.0). Electronically Signed   By: Nelson Chimes M.D.   On: 10/05/2022 21:20   DG Chest 1 View  Result Date: 10/05/2022 CLINICAL DATA:  Altered mental status EXAM: CHEST  1 VIEW COMPARISON:  06/02/2021 FINDINGS: Heart is normal size. Mediastinal contours within normal limits. Bibasilar atelectasis. No effusions or pneumothorax. No acute bony abnormality. IMPRESSION: Bibasilar atelectasis. Electronically Signed   By: Rolm Baptise M.D.   On: 10/05/2022 21:13   DG Pelvis 1-2 Views  Result Date: 10/05/2022 CLINICAL DATA:  Left hip and pelvic pain EXAM: PELVIS - 1-2 VIEW COMPARISON:  None  Available. FINDINGS: Mild symmetric degenerative changes in the hips with joint space narrowing and spurring. No acute bony abnormality. Specifically, no fracture, subluxation, or dislocation. Degenerative changes in the visualized lower lumbar spine. IMPRESSION: No acute bony abnormality. Electronically Signed   By: Rolm Baptise M.D.   On: 10/05/2022 21:13   CT Head Wo Contrast  Result Date: 10/05/2022 CLINICAL DATA:  Delirium.  Low back pain. EXAM: CT HEAD WITHOUT CONTRAST TECHNIQUE: Contiguous axial images were obtained from the base of the skull through the vertex without intravenous contrast. RADIATION DOSE REDUCTION: This exam was performed according to the departmental dose-optimization program which includes automated exposure control, adjustment of the mA and/or kV according to patient size and/or use of iterative reconstruction technique. COMPARISON:  MRI 02/28/2004 FINDINGS: Brain: Generalized brain volume loss. Moderate chronic small-vessel ischemic changes of the white matter. No sign of acute infarction, mass lesion, hemorrhage,  hydrocephalus or extra-axial collection. Vascular: There is atherosclerotic calcification of the major vessels at the base of the brain. Skull: Negative Sinuses/Orbits: Opacified right maxillary sinus.  Orbits negative. Other: None IMPRESSION: 1. No acute CT finding. Atrophy and chronic small-vessel ischemic changes of the white matter. 2. Opacified right maxillary sinus. Electronically Signed   By: Nelson Chimes M.D.   On: 10/05/2022 21:11       IMPRESSION/PLAN: 1. Metastatic Prostate Cancer to Spine. Dr. Lisbeth Renshaw has reviewed this pt's case including imaging and pathology findings and recommends a palliative dose of radiation to his lumbar spine from L2-L4.  We discussed the risks, benefits, [place], and long term effects of radiotherapy, as well as the curative intent, and the patient and his family are interested in proceeding. Dr. Lisbeth Renshaw discusses the delivery and logistics of radiotherapy and anticipates a course of 2 weeks of radiotherapy. We will simulate tomorrow afternoon and anticipate starting radiation on 10/17/21 or sooner if neurologic function clinically declines. He's been started on Dexadron and should continue this after discharge; we will taper this at the conclusion of radiation as an outpatient.    In a visit lasting 60 minutes, greater than 50% of the time was spent by phone and in floor time discussing the patient's condition, in preparation for the discussion, and coordinating the patient's care.       Carola Rhine, [facility]   **Disclaimer: This note was dictated with voice recognition software. Similar sounding words can inadvertently be transcribed and this note may contain transcription errors which may not have been corrected upon publication of note.**

## 2022-10-13 NOTE — Plan of Care (Signed)

## 2022-10-13 NOTE — Telephone Encounter (Signed)
Scheduled appt per 2/15 secure chat. Called pt, no answer. Left msg with appt information. Requested for pt to call back to confirm appt.

## 2022-10-13 NOTE — Progress Notes (Signed)
PT Cancellation Note  Patient Details Name: Nicholas Burton MRN: [id number] DOB: 1928/03/16   Cancelled Treatment:    Reason Eval/Treat Not Completed: (P) Patient at procedure or test/unavailable (Pt off floor for radiation simulation. Will continue efforts as schedule allows.)   Michelle Nasuti 10/13/2022, 2:52 PM

## 2022-10-13 NOTE — Progress Notes (Signed)
PROGRESS NOTE  Nicholas Burton [id number] DOB: 02/21/1928 DOA: 10/05/2022 PCP: [name], MD   LOS: 8 days   Brief Narrative / Interim history: This is a 89 year old male with history of dementia, HOH, DM 2, chronic diastolic CHF, HTN, HLD who was admitted with acute encephalopathy.  He has been having worsening lower back pain, new onset, as well as increased weakness in his legs.  Imaging on admission showed findings concerning for bone mets involving L2-L4.  Underwent biopsy and it showed metastatic carcinoma with suspected prostate source.  Oncology consulted  Subjective / 24h Interval events: No complaints other than his back pain. Son is at bedside  Assesement and Plan: Principal Problem:   Diskitis Active Problems:   DM2 (diabetes mellitus, type 2) ([place])   HLD (hyperlipidemia)   Essential hypertension   Chronic diastolic CHF (congestive heart failure) ([facility])   Acute encephalopathy   Lytic lesion of bone on x-ray   Hypokalemia   SIRS (systemic inflammatory response syndrome) ([facility])   Prostate cancer metastatic to bone [facility])   Counseling regarding advance care planning and goals of care  Principal problem Metastatic prostate cancer -with lytic destructive pattern of the lumbar vertebral bodies involving L2-L4.  There were initial concerns about this resenting osteomyelitis/discitis, ID consulted but this is less likely.  Oncology and RadOnc consulted. Will start getting XRT to his back on Monday, for 2 weeks  Active problems Acute metabolic encephalopathy -unclear etiology, possibly related to #1.  Overall improving.  Hypokalemia -continue to replete and monitor  Chronic diastolic CHF -most recent 2D echo shows LVEF 60-65%.  Appears euvolemic  AKI -due to urinary retention, Foley placed, continue.  Will need outpatient follow-up with urology.  Creatinine overall stable  Essential hypertension -continue metoprolol  DM2 -continue long-acting, sliding scale  CBG (last  3)  Recent Labs    10/12/22 2045 10/13/22 0737 10/13/22 1121  GLUCAP 324* 137* 322*     Scheduled Meds:  acetaminophen  1,000 mg Oral TID   Chlorhexidine Gluconate Cloth  6 each Topical Daily   dexamethasone  4 mg Oral Daily   insulin aspart  0-15 Units Subcutaneous TID WC   insulin aspart  0-5 Units Subcutaneous QHS   insulin aspart  3 Units Subcutaneous TID WC   insulin glargine-yfgn  15 Units Subcutaneous QHS   lidocaine  1 patch Transdermal Q24H   metoprolol tartrate  25 mg Oral BID   polyethylene glycol  17 g Oral Daily   tamsulosin  0.4 mg Oral Daily   Continuous Infusions: PRN Meds:.fentaNYL (SUBLIMAZE) injection, HYDROcodone-acetaminophen, melatonin, naLOXone (NARCAN)  injection, naphazoline-glycerin, ondansetron (ZOFRAN) IV, traMADol  Current Outpatient Medications  Medication Instructions   atorvastatin (LIPITOR) 10 mg, Oral, Daily   cholecalciferol (VITAMIN D) 1,000 Units, Oral, Daily   Colesevelam HCl 3.75 g PACK MIX AND DRINK 1 PACKET DAILY   cyanocobalamin (VITAMIN B12) 500 mcg, Oral, Daily   Dietary Management Product (VASCULERA) TABS 1 tablet, Oral, Daily   empagliflozin (JARDIANCE) 10 mg, Oral, Daily before breakfast   HYDROcodone-acetaminophen (NORCO/VICODIN) 5-325 MG tablet 1 tablet, Oral, Every 6 hours PRN   LANTUS SOLOSTAR 100 UNIT/ML Solostar Pen INJECT 0.3ML (30 UNITS TOTAL) INTO THE SKIN AT BEDTIME   losartan (COZAAR) 25 mg, Oral, Daily   metFORMIN (GLUCOPHAGE) 500 MG tablet TAKE 1 TABLET TWICE A DAY WITH MEALS   methocarbamol (ROBAXIN) 500 mg, Oral, 2 times daily   metoprolol tartrate (LOPRESSOR) 25 MG tablet TAKE ONE-HALF (1/2) TABLET TWICE A DAY  Multiple Vitamins-Minerals (PRESERVISION/LUTEIN) CAPS 1 capsule, Oral, 2 times daily,     nabumetone (RELAFEN) 750 mg, Oral, 2 times daily PRN   torsemide (DEMADEX) 10 mg, Oral, Daily, Please call our office to schedule a yearly follow up for future refills. [phone]. Thank you (First attempt)    Vibegron (GEMTESA) 75 MG TABS TAKE 1 TABLET DAILY    Diet Orders (From admission, onward)     Start     Ordered   10/07/22 1508  Diet regular Room service appropriate? Yes; Fluid consistency: Thin  Diet effective now       Question Answer Comment  Room service appropriate? Yes   Fluid consistency: Thin      10/07/22 1507            DVT prophylaxis: SCDs Start: 10/05/22 2322   Lab Results  Component Value Date   PLT 306 10/13/2022      Code Status: DNR  Family Communication: son at bedside   Status is: Inpatient  Remains inpatient appropriate because: stable to go to CIR  Level of care: Med-Surg  Consultants:  Oncology Rad Onc  Objective: Vitals:   10/12/22 1344 10/12/22 2202 10/13/22 0502 10/13/22 0735  BP: 128/75 (!) 142/85 (!) 142/75 126/79  Pulse: 87 95 89 91  Resp: 18 19 19   $ Temp: 98.1 F (36.7 C) 98 F (36.7 C) 98.3 F (36.8 C) 98 F (36.7 C)  TempSrc: Oral Oral Oral Oral  SpO2: 96% 97% 96% 97%  Weight:      Height:        Intake/Output Summary (Last 24 hours) at 10/13/2022 1500 Last data filed at 10/13/2022 0500 Gross per 24 hour  Intake --  Output 1050 ml  Net -1050 ml    Wt Readings from Last 3 Encounters:  10/11/22 77.1 kg  09/21/22 77.1 kg  09/07/22 85.7 kg    Examination:  Constitutional: NAD Eyes: lids and conjunctivae normal, no scleral icterus ENMT: mmm Neck: normal, supple Respiratory: clear to auscultation bilaterally, no wheezing, no crackles. Normal respiratory effort.  Cardiovascular: Regular rate and rhythm, no murmurs / rubs / gallops. No LE edema. Abdomen: soft, no distention, no tenderness. Bowel sounds positive.  Skin: no rashes Neurologic: no focal deficits, equal strength  Data Reviewed: I have independently reviewed following labs and imaging studies   CBC Recent Labs  Lab 10/08/22 0426 10/09/22 0341 10/10/22 0341 10/12/22 0257 10/13/22 0428  WBC 13.4* 10.8* 10.6* 9.5 10.8*  HGB 11.9* 10.8* 10.7*  11.1* 11.5*  HCT 36.7* 32.3* 33.1* 33.7* 35.4*  PLT 210 191 224 272 306  MCV 89.5 88.7 89.2 88.2 88.3  MCH 29.0 29.7 28.8 29.1 28.7  MCHC 32.4 33.4 32.3 32.9 32.5  RDW 15.5 15.4 15.3 15.1 14.9     Recent Labs  Lab 10/07/22 0326 10/08/22 0426 10/09/22 0341 10/10/22 0341 10/11/22 0258 10/12/22 0257 10/13/22 0428  NA  --    < > 138 137 138 136 138  K  --    < > 3.3* 3.5 3.5 3.6 3.6  CL  --    < > 105 106 104 103 105  CO2  --    < > 24 22 25 24 23  $ GLUCOSE  --    < > 111* 215* 96 172* 100*  BUN  --    < > 24* 24* 16 15 17  $ CREATININE  --    < > 2.17* 1.81* 1.44* 1.49* 1.53*  CALCIUM  --    < >  8.1* 8.0* 8.2* 8.1* 8.1*  AST  --   --   --   --   --   --  33  ALT  --   --   --   --   --   --  35  ALKPHOS  --   --   --   --   --   --  115  BILITOT  --   --   --   --   --   --  0.3  ALBUMIN  --   --   --   --   --   --  2.1*  MG  --   --   --   --   --   --  1.7  INR 1.1  --   --   --   --   --   --    < > = values in this interval not displayed.     ------------------------------------------------------------------------------------------------------------------ No results for input(s): "CHOL", "HDL", "LDLCALC", "TRIG", "CHOLHDL", "LDLDIRECT" in the last 72 hours.  Lab Results  Component Value Date   HGBA1C 8.0 (H) 07/26/2022   ------------------------------------------------------------------------------------------------------------------ No results for input(s): "TSH", "T4TOTAL", "T3FREE", "THYROIDAB" in the last 72 hours.  Invalid input(s): "FREET3"  Cardiac Enzymes No results for input(s): "CKMB", "TROPONINI", "MYOGLOBIN" in the last 168 hours.  Invalid input(s): "CK" ------------------------------------------------------------------------------------------------------------------ No results found for: "BNP"  CBG: Recent Labs  Lab 10/12/22 1147 10/12/22 1807 10/12/22 2045 10/13/22 0737 10/13/22 1121  GLUCAP 225* 91 324* 137* 322*     Recent Results  (from the past 240 hour(s))  Resp panel by RT-PCR (RSV, Flu A&B, Covid) Anterior Nasal Swab     Status: None   Collection Time: 10/05/22 10:18 PM   Specimen: Anterior Nasal Swab  Result Value Ref Range Status   SARS Coronavirus 2 by RT PCR NEGATIVE NEGATIVE Final   Influenza A by PCR NEGATIVE NEGATIVE Final   Influenza B by PCR NEGATIVE NEGATIVE Final    Comment: (NOTE) The Xpert Xpress SARS-CoV-2/FLU/RSV plus assay is intended as an aid in the diagnosis of influenza from Nasopharyngeal swab specimens and should not be used as a sole basis for treatment. Nasal washings and aspirates are unacceptable for Xpert Xpress SARS-CoV-2/FLU/RSV testing.  Fact Sheet for Patients: EntrepreneurPulse.com.au  Fact Sheet for Healthcare Providers: IncredibleEmployment.be  This test is not yet approved or cleared by the [place] FDA and has been authorized for detection and/or diagnosis of SARS-CoV-2 by FDA under an Emergency Use Authorization (EUA). This EUA will remain in effect (meaning this test can be used) for the duration of the COVID-19 declaration under Section 564(b)(1) of the Act, 21 U.S.C. section 360bbb-3(b)(1), unless the authorization is terminated or revoked.     Resp Syncytial Virus by PCR NEGATIVE NEGATIVE Final    Comment: (NOTE) Fact Sheet for Patients: EntrepreneurPulse.com.au  Fact Sheet for Healthcare Providers: IncredibleEmployment.be  This test is not yet approved or cleared by the [place] FDA and has been authorized for detection and/or diagnosis of SARS-CoV-2 by FDA under an Emergency Use Authorization (EUA). This EUA will remain in effect (meaning this test can be used) for the duration of the COVID-19 declaration under Section 564(b)(1) of the Act, 21 U.S.C. section 360bbb-3(b)(1), unless the authorization is terminated or revoked.  Performed at [facility], [place]  [address], [place], [place] [address]   Culture, blood (single) w Reflex to ID Panel     Status: None  Collection Time: 10/06/22  8:30 AM   Specimen: BLOOD  Result Value Ref Range Status   Specimen Description BLOOD RIGHT ANTECUBITAL  Final   Special Requests   Final    BOTTLES DRAWN AEROBIC AND ANAEROBIC Blood Culture results may not be optimal due to an excessive volume of blood received in culture bottles   Culture   Final    NO GROWTH 5 DAYS Performed at [facility], [place] [address], [place], [place] [address]    Report Status 10/11/2022 FINAL  Final     Radiology Studies: No results found.   Marzetta Board, MD, PhD [facility]  Between 7 am - 7 pm I am available, please contact me via Amion (for emergencies) or Securechat (non urgent messages)  Between 7 pm - 7 am I am not available, please contact night coverage MD/APP via Amion

## 2022-10-13 NOTE — Progress Notes (Signed)
Daily Progress Note   Patient Name: Nicholas Burton       Date: 10/13/2022 DOB: 1928-05-28  Age: 89 y.o. MRN#: [id number] Attending Physician: Caren Griffins, MD Primary Care Physician: [name], MD Admit Date: 10/05/2022 Length of Stay: 8 days  Reason for Consultation/Follow-up: Establishing goals of care  HPI/Patient Profile:  89 year old gentleman with confusion and altered mental status multiple medical comorbidities possible encephalopathy with CT and MRI evidence of discitis osteomyelitis and a component of epidural abscess.    The palliative care team has been asked to get involved for further goals of care conversations.  Subjective:   Subjective: Chart Reviewed. Updates received. Patient Assessed. Created space and opportunity for patient  and family to explore thoughts and feelings regarding current medical situation.  Today's Discussion: Depends on the patient at the bedside.  Both knees were present.  I asked how he was doing and he states he is doing fine.  We had some pleasant conversation.  The patient's sons informed me that the patient's wife is admitted to [address] and they are planning on bringing him up there in a wheelchair shortly to visit with her.  We had a discussion about CODE STATUS with the patient and both sons.  After extensive explanations and reinforcement of information, I feel he understands what we are talking about.  He did elect "DNR status".  Otherwise he would like to continue full scope of care.  They are planning to take him to [facility] today for radiation mapping.  He is planning to undergo radiation therapy from a palliative approach to his spinal mets.  I explained that I will be off service for the next few days.  However, I feel goals are clear.  We are awaiting insurance authorization for admission to [facility] for inpatient rehab.  While the patient notes that he wants to go home, family is encouraging him to participate in aggressive rehab in  order to be safe at home.  He verbalized understanding.  I provided emotional and general support through therapeutic listening, empathy, sharing of stories, and other techniques. I answered all questions and addressed all concerns to the best of my ability.  Review of Systems  Respiratory:  Negative for cough and shortness of breath.   Cardiovascular:  Negative for chest pain.  Gastrointestinal:  Negative for abdominal pain, nausea and vomiting.  Musculoskeletal:  Positive for back pain.    Objective:   Vital Signs:  BP 126/79 (BP Location: Right Arm)   Pulse 91   Temp 98 F (36.7 C) (Oral)   Resp 19   Ht 5' 9"$  (1.753 m)   Wt 77.1 kg   SpO2 97%   BMI 25.10 kg/m   Physical Exam: Physical Exam Vitals and nursing note reviewed.  Constitutional:      General: He is not in acute distress.    Appearance: He is ill-appearing.  HENT:     Head: Normocephalic and atraumatic.  Cardiovascular:     Rate and Rhythm: Normal rate.  Pulmonary:     Effort: Pulmonary effort is normal. No respiratory distress.     Breath sounds: No wheezing or rhonchi.  Abdominal:     General: Abdomen is flat. Bowel sounds are normal. There is no distension.     Palpations: Abdomen is soft.  Skin:    General: Skin is warm and dry.  Neurological:     General: No focal deficit present.     Mental Status: He is  alert and oriented to person, place, and time.  Psychiatric:        Mood and Affect: Mood normal.        Behavior: Behavior normal.     Palliative Assessment/Data: 50%    Existing Vynca/ACP Documentation: DNR completed 10/13/2022  Assessment & Plan:   Impression: Present on Admission:  Acute encephalopathy  Lytic lesion of bone on x-ray  Hypokalemia  SIRS (systemic inflammatory response syndrome) ([facility])  Chronic diastolic CHF (congestive heart failure) (H[facility])  Essential hypertension  HLD (hyperlipidemia)  Diskitis  SUMMARY OF RECOMMENDATIONS   Changed to DNR Full scope of care  otherwise Await decision on CIR admission Anticipate transportation to [facility] today for radiation mapping Goals are clear PMT will back off for now Please contact us for significant clinical change or new palliative needs  Symptom Management:  Per primary team PMT is available to assist as needed  Code Status: DNR  Prognosis: Unable to determine  Discharge Planning: To Be Determined  Discussed with: Patient, patient's family, medical team, nursing team  Thank you for allowing [place] to participate in the care of Nicholas Burton PMT will continue to support holistically.  Time Total: 75 min  Visit consisted of counseling and education dealing with the complex and emotionally intense issues of symptom management and palliative care in the setting of serious and potentially life-threatening illness. Greater than 50%  of this time was spent counseling and coordinating care related to the above assessment and plan.  Walden Field, NP Palliative Medicine Team  Team Phone # [phone] (Nights/Weekends)  04/27/2021, 8:17 AM

## 2022-10-14 ENCOUNTER — Other Ambulatory Visit: Payer: Self-pay

## 2022-10-14 ENCOUNTER — Inpatient Hospital Stay (HOSPITAL_COMMUNITY)
Admission: RE | Admit: 2022-10-14 | Discharge: 2022-10-22 | DRG: 945 | Disposition: A | Discharge status: Home Health | Payer: Medicare Other | Source: Intra-hospital | Attending: Physical Medicine and Rehabilitation | Admitting: Physical Medicine and Rehabilitation

## 2022-10-14 ENCOUNTER — Encounter (HOSPITAL_COMMUNITY): Payer: Self-pay | Admitting: Physical Medicine and Rehabilitation

## 2022-10-14 DIAGNOSIS — N179 Acute kidney failure, unspecified: Secondary | ICD-10-CM | POA: Diagnosis present

## 2022-10-14 DIAGNOSIS — Z741 Need for assistance with personal care: Secondary | ICD-10-CM | POA: Diagnosis present

## 2022-10-14 DIAGNOSIS — Y92239 Unspecified place in hospital as the place of occurrence of the external cause: Secondary | ICD-10-CM | POA: Diagnosis not present

## 2022-10-14 DIAGNOSIS — N3289 Other specified disorders of bladder: Secondary | ICD-10-CM | POA: Diagnosis present

## 2022-10-14 DIAGNOSIS — H6123 Impacted cerumen, bilateral: Secondary | ICD-10-CM | POA: Diagnosis present

## 2022-10-14 DIAGNOSIS — K5900 Constipation, unspecified: Secondary | ICD-10-CM | POA: Diagnosis present

## 2022-10-14 DIAGNOSIS — T380X5A Adverse effect of glucocorticoids and synthetic analogues, initial encounter: Secondary | ICD-10-CM | POA: Diagnosis not present

## 2022-10-14 DIAGNOSIS — R338 Other retention of urine: Secondary | ICD-10-CM | POA: Diagnosis present

## 2022-10-14 DIAGNOSIS — F0390 Unspecified dementia without behavioral disturbance: Secondary | ICD-10-CM | POA: Diagnosis present

## 2022-10-14 DIAGNOSIS — N183 Chronic kidney disease, stage 3 unspecified: Secondary | ICD-10-CM

## 2022-10-14 DIAGNOSIS — Z85820 Personal history of malignant melanoma of skin: Secondary | ICD-10-CM

## 2022-10-14 DIAGNOSIS — I130 Hypertensive heart and chronic kidney disease with heart failure and stage 1 through stage 4 chronic kidney disease, or unspecified chronic kidney disease: Secondary | ICD-10-CM | POA: Diagnosis present

## 2022-10-14 DIAGNOSIS — Z7984 Long term (current) use of oral hypoglycemic drugs: Secondary | ICD-10-CM

## 2022-10-14 DIAGNOSIS — E559 Vitamin D deficiency, unspecified: Secondary | ICD-10-CM | POA: Diagnosis present

## 2022-10-14 DIAGNOSIS — C61 Malignant neoplasm of prostate: Secondary | ICD-10-CM | POA: Diagnosis present

## 2022-10-14 DIAGNOSIS — R748 Abnormal levels of other serum enzymes: Secondary | ICD-10-CM | POA: Diagnosis not present

## 2022-10-14 DIAGNOSIS — E1122 Type 2 diabetes mellitus with diabetic chronic kidney disease: Secondary | ICD-10-CM | POA: Diagnosis present

## 2022-10-14 DIAGNOSIS — E785 Hyperlipidemia, unspecified: Secondary | ICD-10-CM | POA: Diagnosis present

## 2022-10-14 DIAGNOSIS — E1165 Type 2 diabetes mellitus with hyperglycemia: Secondary | ICD-10-CM | POA: Diagnosis not present

## 2022-10-14 DIAGNOSIS — E669 Obesity, unspecified: Secondary | ICD-10-CM | POA: Diagnosis present

## 2022-10-14 DIAGNOSIS — I5042 Chronic combined systolic (congestive) and diastolic (congestive) heart failure: Secondary | ICD-10-CM

## 2022-10-14 DIAGNOSIS — Z79899 Other long term (current) drug therapy: Secondary | ICD-10-CM | POA: Diagnosis not present

## 2022-10-14 DIAGNOSIS — R339 Retention of urine, unspecified: Secondary | ICD-10-CM | POA: Diagnosis present

## 2022-10-14 DIAGNOSIS — G9341 Metabolic encephalopathy: Secondary | ICD-10-CM | POA: Diagnosis present

## 2022-10-14 DIAGNOSIS — M545 Low back pain, unspecified: Secondary | ICD-10-CM | POA: Diagnosis not present

## 2022-10-14 DIAGNOSIS — M48061 Spinal stenosis, lumbar region without neurogenic claudication: Secondary | ICD-10-CM | POA: Diagnosis present

## 2022-10-14 DIAGNOSIS — M4646 Discitis, unspecified, lumbar region: Secondary | ICD-10-CM | POA: Diagnosis not present

## 2022-10-14 DIAGNOSIS — R5381 Other malaise: Secondary | ICD-10-CM | POA: Diagnosis present

## 2022-10-14 DIAGNOSIS — R7989 Other specified abnormal findings of blood chemistry: Secondary | ICD-10-CM | POA: Diagnosis not present

## 2022-10-14 DIAGNOSIS — N401 Enlarged prostate with lower urinary tract symptoms: Secondary | ICD-10-CM | POA: Diagnosis present

## 2022-10-14 DIAGNOSIS — Z8249 Family history of ischemic heart disease and other diseases of the circulatory system: Secondary | ICD-10-CM

## 2022-10-14 DIAGNOSIS — R413 Other amnesia: Secondary | ICD-10-CM | POA: Diagnosis present

## 2022-10-14 DIAGNOSIS — C7951 Secondary malignant neoplasm of bone: Principal | ICD-10-CM

## 2022-10-14 DIAGNOSIS — I10 Essential (primary) hypertension: Secondary | ICD-10-CM

## 2022-10-14 DIAGNOSIS — Z9049 Acquired absence of other specified parts of digestive tract: Secondary | ICD-10-CM

## 2022-10-14 DIAGNOSIS — Z510 Encounter for antineoplastic radiation therapy: Secondary | ICD-10-CM | POA: Diagnosis present

## 2022-10-14 DIAGNOSIS — Z6825 Body mass index (BMI) 25.0-25.9, adult: Secondary | ICD-10-CM

## 2022-10-14 DIAGNOSIS — E119 Type 2 diabetes mellitus without complications: Secondary | ICD-10-CM

## 2022-10-14 DIAGNOSIS — Z833 Family history of diabetes mellitus: Secondary | ICD-10-CM

## 2022-10-14 DIAGNOSIS — Z794 Long term (current) use of insulin: Secondary | ICD-10-CM | POA: Diagnosis not present

## 2022-10-14 DIAGNOSIS — Z85828 Personal history of other malignant neoplasm of skin: Secondary | ICD-10-CM

## 2022-10-14 DIAGNOSIS — Z66 Do not resuscitate: Secondary | ICD-10-CM | POA: Diagnosis present

## 2022-10-14 DIAGNOSIS — N1832 Chronic kidney disease, stage 3b: Secondary | ICD-10-CM | POA: Diagnosis not present

## 2022-10-14 DIAGNOSIS — Z923 Personal history of irradiation: Secondary | ICD-10-CM

## 2022-10-14 DIAGNOSIS — E118 Type 2 diabetes mellitus with unspecified complications: Secondary | ICD-10-CM

## 2022-10-14 DIAGNOSIS — M47816 Spondylosis without myelopathy or radiculopathy, lumbar region: Secondary | ICD-10-CM | POA: Diagnosis present

## 2022-10-14 DIAGNOSIS — N2889 Other specified disorders of kidney and ureter: Secondary | ICD-10-CM

## 2022-10-14 LAB — GLUCOSE, CAPILLARY
Glucose-Capillary: 207 mg/dL — ABNORMAL HIGH (ref 70–99)
Glucose-Capillary: 300 mg/dL — ABNORMAL HIGH (ref 70–99)
Glucose-Capillary: 486 mg/dL — ABNORMAL HIGH (ref 70–99)
Glucose-Capillary: 484 mg/dL — ABNORMAL HIGH (ref 70–99)
Glucose-Capillary: 492 mg/dL — ABNORMAL HIGH (ref 70–99)
Glucose-Capillary: 378 mg/dL — ABNORMAL HIGH (ref 70–99)
Glucose-Capillary: 379 mg/dL — ABNORMAL HIGH (ref 70–99)
Glucose-Capillary: 343 mg/dL — ABNORMAL HIGH (ref 70–99)

## 2022-10-14 MED ORDER — NAPHAZOLINE-GLYCERIN 0.012-0.25 % OP SOLN
1.0000 [drp] | Freq: Four times a day (QID) | OPHTHALMIC | Status: DC | PRN
  Administered 2022-10-19: 1 [drp] via OPHTHALMIC

## 2022-10-14 MED ORDER — INSULIN ASPART 100 UNIT/ML IJ SOLN
3.0000 [IU] | Freq: Three times a day (TID) | INTRAMUSCULAR | Status: DC
  Administered 2022-10-15 – 2022-10-18 (×7): 3 [IU] via SUBCUTANEOUS

## 2022-10-14 MED ORDER — HYDROCODONE-ACETAMINOPHEN 5-325 MG PO TABS
1.0000 | ORAL_TABLET | Freq: Four times a day (QID) | ORAL | Status: DC | PRN
  Administered 2022-10-16 – 2022-10-19 (×4): 1 via ORAL
  Filled 2022-10-14 (×5): qty 1

## 2022-10-14 MED ORDER — DEXAMETHASONE 4 MG PO TABS: 4.0000 mg | ORAL_TABLET | Freq: Every day | ORAL | Status: DC

## 2022-10-14 MED ORDER — METOPROLOL TARTRATE 25 MG PO TABS
25.0000 mg | ORAL_TABLET | Freq: Two times a day (BID) | ORAL | Status: DC
  Administered 2022-10-14 – 2022-10-19 (×10): 25 mg via ORAL
  Filled 2022-10-14 (×10): qty 1

## 2022-10-14 MED ORDER — INSULIN GLARGINE-YFGN 100 UNIT/ML ~~LOC~~ SOLN
15.0000 [IU] | Freq: Every day | SUBCUTANEOUS | Status: DC
  Filled 2022-10-14: qty 0.15

## 2022-10-14 MED ORDER — TRAMADOL HCL 50 MG PO TABS
25.0000 mg | ORAL_TABLET | Freq: Two times a day (BID) | ORAL | Status: DC | PRN
  Filled 2022-10-14: qty 1

## 2022-10-14 MED ORDER — SODIUM CHLORIDE 0.9 % IV SOLN: INTRAVENOUS | Status: AC

## 2022-10-14 MED ORDER — INSULIN ASPART 100 UNIT/ML IJ SOLN: 10.0000 [IU] | Freq: Once | INTRAMUSCULAR | Status: DC

## 2022-10-14 MED ORDER — LIDOCAINE 5 % EX PTCH
1.0000 | MEDICATED_PATCH | CUTANEOUS | Status: DC
  Administered 2022-10-15 – 2022-10-19 (×5): 1 via TRANSDERMAL
  Filled 2022-10-14 (×3): qty 1

## 2022-10-14 MED ORDER — POLYETHYLENE GLYCOL 3350 17 G PO PACK
17.0000 g | PACK | Freq: Every day | ORAL | Status: DC
  Administered 2022-10-15 – 2022-10-19 (×4): 17 g via ORAL
  Filled 2022-10-14 (×5): qty 1

## 2022-10-14 MED ORDER — DEXAMETHASONE 4 MG PO TABS
4.0000 mg | ORAL_TABLET | Freq: Every day | ORAL | Status: DC
  Administered 2022-10-15 – 2022-10-19 (×5): 4 mg via ORAL
  Filled 2022-10-14 (×5): qty 1

## 2022-10-14 MED ORDER — TAMSULOSIN HCL 0.4 MG PO CAPS
0.4000 mg | ORAL_CAPSULE | Freq: Every day | ORAL | Status: DC
  Administered 2022-10-15 – 2022-10-19 (×5): 0.4 mg via ORAL
  Filled 2022-10-14 (×5): qty 1

## 2022-10-14 MED ORDER — CHLORHEXIDINE GLUCONATE CLOTH 2 % EX PADS
6.0000 | MEDICATED_PAD | Freq: Two times a day (BID) | CUTANEOUS | Status: DC
  Administered 2022-10-14: 6 via TOPICAL

## 2022-10-14 MED ORDER — MELATONIN 3 MG PO TABS
3.0000 mg | ORAL_TABLET | Freq: Every evening | ORAL | Status: DC | PRN
  Administered 2022-10-15 – 2022-10-17 (×3): 3 mg via ORAL
  Filled 2022-10-14 (×3): qty 1

## 2022-10-14 MED ORDER — ACETAMINOPHEN 325 MG PO TABS: 325.0000 mg | ORAL_TABLET | ORAL | Status: DC | PRN

## 2022-10-14 MED ORDER — INSULIN GLARGINE-YFGN 100 UNIT/ML ~~LOC~~ SOLN: 16.0000 [IU] | Freq: Every day | SUBCUTANEOUS | Status: DC

## 2022-10-14 MED ORDER — TAMSULOSIN HCL 0.4 MG PO CAPS: 0.4000 mg | ORAL_CAPSULE | Freq: Every day | ORAL | Status: DC

## 2022-10-14 MED ORDER — INSULIN ASPART 100 UNIT/ML IJ SOLN
0.0000 [IU] | Freq: Three times a day (TID) | INTRAMUSCULAR | Status: DC
  Administered 2022-10-14 – 2022-10-15 (×2): 15 [IU] via SUBCUTANEOUS
  Administered 2022-10-15: 3 [IU] via SUBCUTANEOUS
  Administered 2022-10-15: 5 [IU] via SUBCUTANEOUS
  Administered 2022-10-16: 14 [IU] via SUBCUTANEOUS
  Administered 2022-10-16 – 2022-10-17 (×2): 3 [IU] via SUBCUTANEOUS
  Administered 2022-10-17: 8 [IU] via SUBCUTANEOUS
  Administered 2022-10-18: 11 [IU] via SUBCUTANEOUS
  Administered 2022-10-19: 3 [IU] via SUBCUTANEOUS

## 2022-10-14 MED ORDER — INSULIN GLARGINE-YFGN 100 UNIT/ML ~~LOC~~ SOLN
18.0000 [IU] | Freq: Every day | SUBCUTANEOUS | Status: DC
  Administered 2022-10-14: 18 [IU] via SUBCUTANEOUS
  Filled 2022-10-14 (×2): qty 0.18

## 2022-10-14 NOTE — Progress Notes (Signed)
Pt presented with CBG of 486 at 1616. NT notified LPN. Dr. Ranell Patrick MD notified of this event. Ordered to give 15 units of novolog. Rechecked 30 minutes after per MD orders blood sugar increased to 492; switched glucometers and showed at 484; recontacted Dr. Ranell Patrick MD and was instructed to give 10 units of novolog scheduled as 1 time dose @ 1800. Rechecked CBG at 1801 and CBG was 378. Dr. Ranell Patrick notified and orders given to hold 10 units of novolog and to continue as planned with ACHS blood sugar readings.     Gladstone Lighter, LPN

## 2022-10-14 NOTE — Discharge Instructions (Addendum)
Inpatient Rehab Discharge Instructions  Keigen Plaut Eppard Discharge date and time: No discharge date for patient encounter.   Activities/Precautions/ Functional Status: Activity: activity as tolerated Diet: regular diet Wound Care: Routine skin checks Functional status:  ___ No restrictions     ___ Walk up steps independently ___ 24/7 supervision/assistance   ___ Walk up steps with assistance ___ Intermittent supervision/assistance  ___ Bathe/dress independently ___ Walk with walker     _x__ Bathe/dress with assistance ___ Walk Independently    ___ Shower independently ___ Walk with assistance    ___ Shower with assistance ___ No alcohol     ___ Return to work/school ________  COMMUNITY REFERRALS UPON DISCHARGE:    Home Health:   PT     OT                   Agency: [facility] Phone: [phone]    Special Instructions:  No driving smoking or alcohol  My questions have been answered and I understand these instructions. I will adhere to these goals and the provided educational materials after my discharge from the hospital.  Patient/Caregiver Signature _______________________________ Date __________  Clinician Signature _______________________________________ Date __________  Please bring this form and your medication list with you to all your follow-up doctor's appointments.

## 2022-10-14 NOTE — Progress Notes (Signed)
Physical Therapy Treatment Patient Details Name: Nicholas Burton MRN: [id number] DOB: [birth year] Today's Date: 10/14/2022   History of Present Illness 89 y.o. male with medical history significant for dementia, type 2 diabetes mellitus, chronic diastolic heart failure, essential hypertension, hyperlipidemia, admitted 2/7 with acute encephalopathy. MRI evidence of discitis osteomyelitis and a component of epidural abscess.  Pt is s/p IR aspiration/biopsy 2/9.    PT Comments    Pt was in bathroom with son completing hygiene upon arrival. Pt was able to participate in gait trial requiring one seated recovery break due to fatigue. Pt demonstrated good pace and steadiness with RW support. Pt required intermittent cues for RW management to avoid obstacles in hallway. Pt was able to perform standing exercises after gait trial with cues for technique and with no LOB.  Pt continues to benefit from PT services to progress toward functional mobility goals.     Recommendations for follow up therapy are one component of a multi-disciplinary discharge planning process, led by the attending physician.  Recommendations may be updated based on patient status, additional functional criteria and insurance authorization.  Follow Up Recommendations  Acute inpatient rehab (3hours/day)     Assistance Recommended at Discharge Intermittent Supervision/Assistance  Patient can return home with the following A little help with bathing/dressing/bathroom;Assistance with cooking/housework;Direct supervision/assist for financial management;Assist for transportation   Equipment Recommendations  None recommended by PT    Recommendations for Other Services       Precautions / Restrictions Precautions Precautions: Fall Restrictions Weight Bearing Restrictions: No     Mobility  Bed Mobility               General bed mobility comments: Pt in bathroom with son upon arrival    Transfers Overall transfer level:  Needs assistance Equipment used: Rolling walker (2 wheels) Transfers: Sit to/from Stand Sit to Stand: Min guard           General transfer comment: Pt demonstrated safe hand placement. Min guard for safety from lower surface    Ambulation/Gait Ambulation/Gait assistance: Supervision Gait Distance (Feet): 190 Feet (1 seated recovery break) Assistive device: Rolling walker (2 wheels) Gait Pattern/deviations: Step-through pattern, Trunk flexed Gait velocity: Decreased     General Gait Details: Cues for proximity to RW. Pt demonstrated step-through pattern with good cadance. Pt reported one occurance of L hip instability with no LOB.       Balance Overall balance assessment: Needs assistance Sitting-balance support: Feet supported, No upper extremity supported Sitting balance-Leahy Scale: Good     Standing balance support: Bilateral upper extremity supported, During functional activity Standing balance-Leahy Scale: Fair Standing balance comment: with RW support                            Cognition Arousal/Alertness: Awake/alert Behavior During Therapy: WFL for tasks assessed/performed Overall Cognitive Status: History of cognitive impairments - at baseline                                 General Comments: Very HOH        Exercises Total Joint Exercises Knee Flexion: AROM, Both, Standing, 10 reps Marching in Standing: AROM, Both, 10 reps    General Comments General comments (skin integrity, edema, etc.): VSS      Pertinent Vitals/Pain Pain Assessment Pain Assessment: Faces Faces Pain Scale: Hurts little more Pain Location: L hip Pain Descriptors /  Indicators: Discomfort Pain Intervention(s): Limited activity within patient's tolerance, Monitored during session, Repositioned     PT Goals (current goals can now be found in the care plan section) Acute Rehab PT Goals Patient Stated Goal: to go home PT Goal Formulation: With  patient/family Time For Goal Achievement: 10/22/22 Potential to Achieve Goals: Good Progress towards PT goals: Progressing toward goals    Frequency    Min 3X/week      PT Plan Current plan remains appropriate       AM-PAC PT "6 Clicks" Mobility   Outcome Measure  Help needed turning from your back to your side while in a flat bed without using bedrails?: None Help needed moving from lying on your back to sitting on the side of a flat bed without using bedrails?: A Little Help needed moving to and from a bed to a chair (including a wheelchair)?: A Little Help needed standing up from a chair using your arms (e.g., wheelchair or bedside chair)?: A Little Help needed to walk in hospital room?: A Little Help needed climbing 3-5 steps with a railing? : A Little 6 Click Score: 19    End of Session Equipment Utilized During Treatment: Gait belt Activity Tolerance: Patient tolerated treatment well Patient left: in chair;with family/visitor present;with call bell/phone within reach Nurse Communication: Mobility status PT Visit Diagnosis: Unsteadiness on feet (R26.81);Muscle weakness (generalized) (M62.81);Difficulty in walking, not elsewhere classified (R26.2)     Time: [id number] PT Time Calculation (min) (ACUTE ONLY): 16 min  Charges:  $Gait Training: 8-22 mins                     Michelle Nasuti, PTA Acute Rehabilitation Services Secure Chat Preferred  [phone]    Michelle Nasuti 10/14/2022, 9:22 AM

## 2022-10-14 NOTE — Progress Notes (Signed)
Patient ID: Nicholas Burton, male   DOB: 02-28-1928, 89 y.o.   MRN: [id number] Met with the patient and family to review current situation, rehab process, team conference and plan of care. Reviewed medications, and dietary modification recommendations with CBG monitoring 4 x /day and insulin administration with decadron, foley for retention with flomax and vitamin supplements. Continue to follow along to address educational needs to facilitate preparation for discharge. Margarito Liner

## 2022-10-14 NOTE — Progress Notes (Signed)
Inpatient Rehabilitation Admission Medication Review by a Pharmacist  A complete drug regimen review was completed for this patient to identify any potential clinically significant medication issues.  High Risk Drug Classes Is patient taking? 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 PTA meds:  atorvastatin, empagliflozin, metformin, furosemide, vibegron, cholecalciferol, nabumetone,  colesevelam and cyanocobalamin.   Losartan- noted to hold.  Restart PTA meds when and if necessary during CIR admission or at time of discharge, if warranted   Other       Clinically significant medication issues were identified that warrant physician communication and completion of prescribed/recommended actions by midnight of the next day:  No  Name of provider notified for urgent issues identified:   Provider Method of Notification:     Pharmacist comments:   Time spent performing this drug regimen review (minutes):  [place], [place] Clinical Pharmacist

## 2022-10-14 NOTE — Progress Notes (Signed)
Inpatient Rehab Admissions Coordinator:   I have a bed for pt to admit to CIR today. Dr. Cruzita Lederer in agreement.  Will let TOC and pt/family know.   Shann Medal, PT, DPT Admissions Coordinator [phone] 10/14/22  10:15 AM

## 2022-10-14 NOTE — Discharge Summary (Signed)
Physician Discharge Summary  Nicholas Burton [id number] DOB: [birth year] DOA: 10/05/2022  PCP: [name], MD  Admit date: 10/05/2022 Discharge date: 10/14/2022  Admitted From: home Disposition:  CIR  Recommendations for Outpatient Follow-up:  Follow up with PCP in 1-2 weeks  Home Health: none Equipment/Devices: none  Discharge Condition: stable CODE STATUS: DNR Diet Orders (From admission, onward)     Start     Ordered   10/07/22 1508  Diet regular Room service appropriate? Yes; Fluid consistency: Thin  Diet effective now       Question Answer Comment  Room service appropriate? Yes   Fluid consistency: Thin      10/07/22 1507            Brief Narrative / Interim history: This is a 89 year old male with history of dementia, HOH, DM 2, chronic diastolic CHF, HTN, HLD who was admitted with acute encephalopathy.  He has been having worsening lower back pain, new onset, as well as increased weakness in his legs.  Imaging on admission showed findings concerning for bone mets involving L2-L4.  Underwent biopsy and it showed metastatic carcinoma with suspected prostate source.  Oncology consulted  Hospital Course / Discharge diagnoses: Principal Problem:   Diskitis Active Problems:   DM2 (diabetes mellitus, type 2) (HCC)   HLD (hyperlipidemia)   Essential hypertension   Chronic diastolic CHF (congestive heart failure) (HCC)   Acute encephalopathy   Lytic lesion of bone on x-ray   Hypokalemia   SIRS (systemic inflammatory response syndrome) (HCC)   Prostate cancer metastatic to bone [facility])   Counseling regarding advance care planning and goals of care   Principal problem Metastatic prostate cancer -with lytic destructive pattern of the lumbar vertebral bodies involving L2-L4.  There were initial concerns about this resenting osteomyelitis/discitis, ID consulted but this is less likely.  Oncology and RadOnc consulted, appreciate input. Will start getting XRT to his back  on Monday, for 2 weeks.  Has been placed on Decadron   Active problems Acute metabolic encephalopathy -due to #1/acute illness.  Improved and now returned to baseline  Hypokalemia -continue to replete and monitor as indicated Chronic diastolic CHF -most recent 2D echo shows LVEF 60-65%.  Appears euvolemic. AKI -due to urinary retention, Foley placed, continue.  Will need outpatient follow-up with urology.  Creatinine overall stable, his ARB was discontinued, continue to monitor blood pressure and resume as indicated Essential hypertension -continue metoprolol, hold ARB as above DM2 -continue home regimen.  Watch for hypoglycemia since he will be on steroids  Sepsis ruled out   Discharge Instructions   Allergies as of 10/14/2022       Reactions   Ace Inhibitors Cough   Oxycodone Itching, Rash   Penicillins Itching, Rash        Medication List     STOP taking these medications    losartan 25 MG tablet Commonly known as: COZAAR       TAKE these medications    atorvastatin 10 MG tablet Commonly known as: LIPITOR TAKE 1 TABLET DAILY   cholecalciferol 1000 units tablet Commonly known as: VITAMIN D Take 1,000 Units by mouth daily.   Colesevelam HCl 3.75 g Pack MIX AND DRINK 1 PACKET DAILY What changed: See the new instructions.   cyanocobalamin 500 MCG tablet Commonly known as: VITAMIN B12 Take 500 mcg by mouth daily.   dexamethasone 4 MG tablet Commonly known as: DECADRON Take 1 tablet (4 mg total) by mouth daily.   empagliflozin  10 MG Tabs tablet Commonly known as: Jardiance Take 1 tablet (10 mg total) by mouth daily before breakfast.   Gemtesa 75 MG Tabs Generic drug: Vibegron TAKE 1 TABLET DAILY What changed: how much to take   HYDROcodone-acetaminophen 5-325 MG tablet Commonly known as: NORCO/VICODIN Take 1 tablet by mouth every 6 (six) hours as needed for moderate pain.   Lantus SoloStar 100 UNIT/ML Solostar Pen Generic drug: insulin  glargine INJECT 0.3ML (30 UNITS TOTAL) INTO THE SKIN AT BEDTIME What changed: See the new instructions.   metFORMIN 500 MG tablet Commonly known as: GLUCOPHAGE TAKE 1 TABLET TWICE A DAY WITH MEALS   methocarbamol 500 MG tablet Commonly known as: ROBAXIN Take 500 mg by mouth in the morning and at bedtime.   metoprolol tartrate 25 MG tablet Commonly known as: LOPRESSOR TAKE ONE-HALF (1/2) TABLET TWICE A DAY What changed:  how much to take how to take this when to take this additional instructions   nabumetone 750 MG tablet Commonly known as: RELAFEN Take 750 mg by mouth 2 (two) times daily as needed for mild pain.   PreserVision/Lutein Caps Take 1 capsule by mouth 2 (two) times daily.   tamsulosin 0.4 MG Caps capsule Commonly known as: FLOMAX Take 1 capsule (0.4 mg total) by mouth daily.   torsemide 10 MG tablet Commonly known as: DEMADEX Take 1 tablet (10 mg total) by mouth daily. Please call our office to schedule a yearly follow up for future refills. [phone]. Thank you (First attempt) What changed: additional instructions   Vasculera Tabs Take 1 tablet by mouth daily.        Follow-up Information     [name], MD Follow up.   Specialty: Internal Medicine Contact information: [place] [address] [phone]                 Consultations: Oncology  Radiation oncology   Procedures/Studies:  CT CHEST ABDOMEN PELVIS WO CONTRAST  Result Date: 10/11/2022 CLINICAL DATA:  Pancreatic cancer staging EXAM: CT CHEST, ABDOMEN AND PELVIS WITHOUT CONTRAST TECHNIQUE: Multidetector CT imaging of the chest, abdomen and pelvis was performed following the standard protocol without IV contrast. RADIATION DOSE REDUCTION: This exam was performed according to the departmental dose-optimization program which includes automated exposure control, adjustment of the mA and/or kV according to patient size and/or use of iterative reconstruction  technique. COMPARISON:  PET-CT 10/05/2005. Lumbar spine CT 10/05/2022. MRI lumbar spine 10/05/2022. FINDINGS: CT CHEST FINDINGS Cardiovascular: No significant vascular findings. Normal heart size. No pericardial effusion. Mediastinum/Nodes: No enlarged mediastinal, hilar, or axillary lymph nodes. Thyroid gland, trachea, and esophagus demonstrate no significant findings. Lungs/Pleura: Lungs are clear. No pleural effusion or pneumothorax. Musculoskeletal: No chest wall mass or suspicious bone lesions identified. Multilevel degenerative changes affect the spine. CT ABDOMEN PELVIS FINDINGS Hepatobiliary: No focal liver abnormality is seen. Status post cholecystectomy. No biliary dilatation. Pancreas: No pancreatic ductal dilatation or surrounding inflammatory changes. There is atrophy of the pancreatic body and tail. Spleen: Normal in size without focal abnormality. Adrenals/Urinary Tract: Bilateral kidneys and adrenal glands are within normal limits. Bladder is decompressed with Foley catheter. There is diffuse bladder wall thickening with surrounding inflammatory stranding. Stomach/Bowel: Stomach is within normal limits. Appendix appears normal. No evidence of bowel wall thickening, distention, or inflammatory changes. There is sigmoid and descending colon diverticulosis. Vascular/Lymphatic: Aortic atherosclerosis. No enlarged abdominal or pelvic lymph nodes. Duplicated IVC noted. Reproductive: Prostate gland is enlarged. Other: There is no ascites. There is  a small fat containing right inguinal hernia. There is nonspecific presacral edema. Musculoskeletal: The bones are osteopenic. Severe degenerative changes affect the spine. There is mottled appearance of the L3 vertebral body with some subtle linear lucencies present in trace loss of vertebral body height. Paravertebral edema and stranding again noted at this level. Findings are unchanged from prior and compatible with MRI findings of osteomyelitis discitis at  L3-L4 with possible neoplastic process of the L3 vertebral body. IMPRESSION: 1. No evidence for metastatic disease in the chest, abdomen or pelvis. 2. Diffuse bladder wall thickening with surrounding inflammatory stranding worrisome for cystitis. 3. Stable abnormal appearance of the L3 which may represent osteomyelitis/discitis versus neoplastic process. 4. Nonspecific presacral edema. 5. Colonic diverticulosis. Aortic Atherosclerosis (ICD10-I70.0). Electronically Signed   By: Ronney Asters M.D.   On: 10/11/2022 17:49   IR Fluoro Guide Ndl Plmt / BX  Result Date: 10/09/2022 INDICATION: Low back pain secondary to osteomyelitis/discitis at L3-L4. EXAM: FLUOROSCOPIC GUIDED CORE BIOPSY AT L3 MEDICATIONS: None. ANESTHESIA/SEDATION: Moderate (conscious) sedation was employed during this procedure. A total of Versed 1 mg and Fentanyl 25 mcg was administered intravenously by the radiology nurse. Total intra-service moderate Sedation Time: 23 minutes. The patient's level of consciousness and vital signs were monitored continuously by radiology nursing throughout the procedure under my direct supervision. COMPLICATIONS: None immediate. PROCEDURE: Informed written consent was obtained from the patient after a thorough discussion of the procedural risks, benefits and alternatives. All questions were addressed. Maximal Sterile Barrier Technique was utilized including caps, mask, sterile gowns, sterile gloves, sterile drape, hand hygiene and skin antiseptic. A timeout was performed prior to the initiation of the procedure. Patient was laid prone on the fluoroscopic table. Skin overlying the lumbar region was then prepped and draped in the usual manner. The right pedicle at L3 was then identified, and the skin entry site was then infiltrated with 0.25% bupivacaine and carried to the pedicle itself. Under intermittent biplane fluoroscopy, a 13 gauge Cook spinal needle was then advanced to the posterior 1/3 at L3. Two passes  were then made with a 16 gauge core biopsy needle. Biopsy material was then obtained with a 20 mL syringe and sent for analysis as per requesting MD. The 13 gauge Cook spinal needle was removed. Hemostasis was achieved at the skin entry site. Patient tolerated the procedure well. IMPRESSION: Status post fluoroscopic guided core biopsy at L3 as described. Electronically Signed   By: Luanne Bras M.D.   On: 10/09/2022 09:59   MR Lumbar Spine W Wo Contrast  Result Date: 10/06/2022 CLINICAL DATA:  Initial evaluation for low back pain, infection. EXAM: MRI LUMBAR SPINE WITHOUT AND WITH CONTRAST TECHNIQUE: Multiplanar and multiecho pulse sequences of the lumbar spine were obtained without and with intravenous contrast. CONTRAST:  7.12m GADAVIST GADOBUTROL 1 MMOL/ML IV SOLN COMPARISON:  CT from earlier the same day. FINDINGS: Segmentation:  Examination degraded by motion artifact. Standard segmentation. Lowest well-formed disc space labeled the L5-S1 level. Alignment:  Moderate levoscoliosis.  No significant listhesis. Vertebrae: Abnormal T1 hypointensity, with heterogeneous STIR hyperintensity and enhancement seen within the L3 vertebral body. Edema and enhancement noted within the adjacent L3-4 interspace as well as about the superior endplate of L4. Findings are nonspecific, but most concerning for possible osteomyelitis discitis. Surrounding paraspinous edema and inflammatory changes. No soft tissue collections. Abnormal enhancing material within the ventral epidural space at the level of L3 measures approximately 2.9 x 1.4 cm (series 8, image 19). Findings concerning for epidural phlegmon.  1.6 cm nonenhancing material at the level could reflect a small epidural abscess versus disc protrusion (series 8, image 17). Ventral epidural enhancement extends from the L1-2 through L4-5 interspace. While these findings are favored to be infectious/inflammatory in nature, a possible underlying neoplastic process  involving the L3 vertebral body remains difficult to exclude. No other convincing evidence for acute infection elsewhere within the lumbar spine. Reactive endplate changes about the L1-2, L2-3, and L5-S1 interspaces favored to be degenerative. Mild edema and enhancement about the L2-3 and L3-4 facets also favored to be degenerative. Underlying bone marrow signal intensity diffusely heterogeneous. No other worrisome osseous lesions. Conus medullaris and cauda equina: Conus extends to the T12 level. Conus and cauda equina appear normal. Paraspinal and other soft tissues: Paraspinous edema and enhancement adjacent to the L3-4 interspace as above. No collections. Partially visualized urinary bladder is markedly distended with associated bladder wall trabeculation and/or diverticula. Mild bilateral hydroureteronephrosis, suspected to be related to bladder distension. Disc levels: L1-2: Advanced degenerative intervertebral disc space narrowing with diffuse disc bulge and disc desiccation. Reactive endplate spurring. Moderate right worse than left facet and ligament flavum hypertrophy. No significant spinal stenosis. Foramina remain patent. L2-3: Moderate degenerative intervertebral disc space narrowing with diffuse disc bulge, asymmetric to the left. Associated reactive endplate spurring. 1.6 cm nonenhancing material within the right ventral epidural space could reflect a right subarticular disc protrusion or possibly epidural abscess (series 8, image 17). Moderate facet and ligament flavum hypertrophy. Resultant mild canal with moderate left lateral recess stenosis. Moderate left L2 foraminal narrowing. Right neural foramen remains patent. L3-4: Advanced intervertebral disc space narrowing with findings concerning for osteomyelitis discitis as above. Probable epidural phlegmon within the ventral epidural space at the level of L3 (series 8, image 19). Moderate left worse than right facet arthrosis. Resultant severe spinal  stenosis. Moderate to severe left with mild right L3 foraminal narrowing. L4-5: Degenerative intervertebral disc space narrowing with disc desiccation and diffuse disc bulge. Left-sided reactive endplate spurring. Moderate facet hypertrophy. Resultant mild narrowing of the left lateral recess. Central canal remains patent. Mild to moderate left L4 foraminal narrowing. Right neural foramen remains patent. L5-S1: Degenerative intervertebral disc space narrowing with disc desiccation and diffuse disc bulge. Reactive endplate spurring. Moderate bilateral facet arthrosis. No significant spinal stenosis. Mild bilateral L5 foraminal narrowing. IMPRESSION: 1. Findings concerning for osteomyelitis discitis at the L3-4 level. Abnormal enhancing material within the ventral epidural space at the level of L3, concerning for epidural phlegmon. These changes superimposed on underlying spondylosis and facet arthrosis resultant severe spinal stenosis. While these changes are favored to reflect infection, a possible neoplastic process involving the L3 vertebral body remains difficult to exclude. Hebner interval follow-up imaging may be helpful for further evaluation as warranted. 2. Surrounding paraspinous edema and enhancement, but with no drainable soft tissue collection collections. 3. Underlying advanced multilevel degenerative spondylosis and facet arthrosis as detailed above. No other high-grade spinal stenosis 4. Markedly distended urinary bladder with associated bladder wall trabeculation and/or diverticula. Clinical correlation for chronic outlet obstruction recommended. Mild bilateral hydroureteronephrosis, suspected to be related to bladder distension. Electronically Signed   By: Jeannine Boga M.D.   On: 10/06/2022 00:47   CT Lumbar Spine Wo Contrast  Result Date: 10/05/2022 CLINICAL DATA:  Low back pain.  Increased fracture risk. EXAM: CT LUMBAR SPINE WITHOUT CONTRAST TECHNIQUE: Multidetector CT imaging of the  lumbar spine was performed without intravenous contrast administration. Multiplanar CT image reconstructions were also generated. RADIATION DOSE REDUCTION: This exam  was performed according to the departmental dose-optimization program which includes automated exposure control, adjustment of the mA and/or kV according to patient size and/or use of iterative reconstruction technique. COMPARISON:  Radiography 07/26/2022 FINDINGS: Segmentation: 5 lumbar type vertebral bodies. Alignment: Chronic mild S shaped scoliotic curvature. Vertebrae: Chronic degenerative endplate changes at the L1-2 level. Newly seen lytic destructive pattern throughout the L3 vertebral body, worse towards the left side. Question some involvement of the L2 vertebral body and superior aspect of the L4 vertebral body. Lucent focus within the central portion of the S1 vertebral body. These findings could be due to metastatic disease, infection or both. See below. Paraspinal and other soft tissues: Distended bladder. Aortic atherosclerosis. Pronounced edematous change in the soft tissues surrounding the L3 vertebral body, worrisome in particular for infection but possibly consistent with tumor as well. Disc levels: T9-10 through T12-L1: Negative L1-2: Chronic disc degeneration with loss of disc height. Endplate osteophytes and mild bulging of the disc. No compressive stenosis. L2-3: Disc space narrowing and vacuum phenomenon. Circumferential bulging of the disc. Facet and ligamentous hypertrophy. Multifactorial stenosis at this level that could cause neural compression. As noted above, there may be some lytic changes of the inferior L2 vertebral body. This raises the possibility of discitis osteomyelitis involving this level. L3-4: Lytic destructive appearance of the L3 vertebral body with loss of height more on the left. Edematous change of the paravertebral soft tissues. Bulging of the disc. Facet and ligamentous hypertrophy. Severe stenosis at  this level. Epidural abscess not excluded. As noted above, there could be lesser destructive change of superior L4. Certainly the findings at this level or worrisome for discitis osteomyelitis. L4-5: Chronic disc degeneration with loss of disc height. Bulging of the disc. Facet and ligamentous hypertrophy. Moderate degenerative type stenosis. L5-S1: Chronic disc degeneration with loss of disc height. Endplate osteophytes and bulging of the disc. Facet degeneration. No canal stenosis. Mild bilateral foraminal stenosis. Lucent area within the S1 vertebral body which is indeterminate. IMPRESSION: 1. Lytic destructive pattern throughout the L3 vertebral body with loss of height more on the left. Question some involvement of the L2 vertebral body and superior aspect of the L4 vertebral body. Pronounced edematous change in the paravertebral soft tissues surrounding the L3 vertebral body, worrisome for infection but possibly consistent with tumor as well. 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 Severe stenosis at the L2-3 and L3-4 levels, possibly resulting in neurogenic bladder. Epidural abscess is not excluded. Is the patient an MR candidate? 2. Lucent area within the S1 vertebral body which is indeterminate. 3. Chronic degenerative changes at L1-2, L4-5 and L5-S1 as above. Aortic Atherosclerosis (ICD10-I70.0). Electronically Signed   By: Nelson Chimes M.D.   On: 10/05/2022 21:20   DG Chest 1 View  Result Date: 10/05/2022 CLINICAL DATA:  Altered mental status EXAM: CHEST  1 VIEW COMPARISON:  06/02/2021 FINDINGS: Heart is normal size. Mediastinal contours within normal limits. Bibasilar atelectasis. No effusions or pneumothorax. No acute bony abnormality. IMPRESSION: Bibasilar atelectasis. Electronically Signed   By: Rolm Baptise M.D.   On: 10/05/2022 21:13   DG Pelvis 1-2 Views  Result Date: 10/05/2022 CLINICAL DATA:  Left hip and pelvic pain EXAM: PELVIS - 1-2 VIEW COMPARISON:  None  Available. FINDINGS: Mild symmetric degenerative changes in the hips with joint space narrowing and spurring. No acute bony abnormality. Specifically, no fracture, subluxation, or dislocation. Degenerative changes in the visualized lower lumbar spine. IMPRESSION: No acute bony abnormality. Electronically  Signed   By: Rolm Baptise M.D.   On: 10/05/2022 21:13   CT Head Wo Contrast  Result Date: 10/05/2022 CLINICAL DATA:  Delirium.  Low back pain. EXAM: CT HEAD WITHOUT CONTRAST TECHNIQUE: Contiguous axial images were obtained from the base of the skull through the vertex without intravenous contrast. RADIATION DOSE REDUCTION: This exam was performed according to the departmental dose-optimization program which includes automated exposure control, adjustment of the mA and/or kV according to patient size and/or use of iterative reconstruction technique. COMPARISON:  MRI 02/28/2004 FINDINGS: Brain: Generalized brain volume loss. Moderate chronic small-vessel ischemic changes of the white matter. No sign of acute infarction, mass lesion, hemorrhage, hydrocephalus or extra-axial collection. Vascular: There is atherosclerotic calcification of the major vessels at the base of the brain. Skull: Negative Sinuses/Orbits: Opacified right maxillary sinus.  Orbits negative. Other: None IMPRESSION: 1. No acute CT finding. Atrophy and chronic small-vessel ischemic changes of the white matter. 2. Opacified right maxillary sinus. Electronically Signed   By: Nelson Chimes M.D.   On: 10/05/2022 21:11     Subjective: - no chest pain, shortness of breath, no abdominal pain, nausea or vomiting.   Discharge Exam: BP 123/67 (BP Location: Right Arm)   Pulse 84   Temp 97.8 F (36.6 C) (Oral)   Resp 17   Ht 5' 9"$  (1.753 m)   Wt 77.1 kg   SpO2 100%   BMI 25.10 kg/m   General: Pt is alert, awake, not in acute distress Cardiovascular: RRR, S1/S2 +, no rubs, no gallops Respiratory: CTA bilaterally, no wheezing, no  rhonchi Abdominal: Soft, NT, ND, bowel sounds + Extremities: no edema, no cyanosis    The results of significant diagnostics from this hospitalization (including imaging, microbiology, ancillary and laboratory) are listed below for reference.     Microbiology: Recent Results (from the past 240 hour(s))  Resp panel by RT-PCR (RSV, Flu A&B, Covid) Anterior Nasal Swab     Status: None   Collection Time: 10/05/22 10:18 PM   Specimen: Anterior Nasal Swab  Result Value Ref Range Status   SARS Coronavirus 2 by RT PCR NEGATIVE NEGATIVE Final   Influenza A by PCR NEGATIVE NEGATIVE Final   Influenza B by PCR NEGATIVE NEGATIVE Final    Comment: (NOTE) The Xpert Xpress SARS-CoV-2/FLU/RSV plus assay is intended as an aid in the diagnosis of influenza from Nasopharyngeal swab specimens and should not be used as a sole basis for treatment. Nasal washings and aspirates are unacceptable for Xpert Xpress SARS-CoV-2/FLU/RSV testing.  Fact Sheet for Patients: EntrepreneurPulse.com.au  Fact Sheet for Healthcare Providers: IncredibleEmployment.be  This test is not yet approved or cleared by the [place] FDA and has been authorized for detection and/or diagnosis of SARS-CoV-2 by FDA under an Emergency Use Authorization (EUA). This EUA will remain in effect (meaning this test can be used) for the duration of the COVID-19 declaration under Section 564(b)(1) of the Act, 21 U.S.C. section 360bbb-3(b)(1), unless the authorization is terminated or revoked.     Resp Syncytial Virus by PCR NEGATIVE NEGATIVE Final    Comment: (NOTE) Fact Sheet for Patients: EntrepreneurPulse.com.au  Fact Sheet for Healthcare Providers: IncredibleEmployment.be  This test is not yet approved or cleared by the [place] FDA and has been authorized for detection and/or diagnosis of SARS-CoV-2 by FDA under an Emergency Use Authorization  (EUA). This EUA will remain in effect (meaning this test can be used) for the duration of the COVID-19 declaration under Section 564(b)(1) of the Act,  21 U.S.C. section 360bbb-3(b)(1), unless the authorization is terminated or revoked.  Performed at [facility], [place] [address] [place], [place] [address]   Culture, blood (single) w Reflex to ID Panel     Status: None   Collection Time: 10/06/22  8:30 AM   Specimen: BLOOD  Result Value Ref Range Status   Specimen Description BLOOD RIGHT ANTECUBITAL  Final   Special Requests   Final    BOTTLES DRAWN AEROBIC AND ANAEROBIC Blood Culture results may not be optimal due to an excessive volume of blood received in culture bottles   Culture   Final    NO GROWTH 5 DAYS Performed at [facility], [place] [address], [place], [place] [address]    Report Status 10/11/2022 FINAL  Final     Labs: Basic Metabolic Panel: Recent Labs  Lab 10/09/22 0341 10/10/22 0341 10/11/22 0258 10/12/22 0257 10/13/22 0428  NA 138 137 138 136 138  K 3.3* 3.5 3.5 3.6 3.6  CL 105 106 104 103 105  CO2 24 22 25 24 23  $ GLUCOSE 111* 215* 96 172* 100*  BUN 24* 24* 16 15 17  $ CREATININE 2.17* 1.81* 1.44* 1.49* 1.53*  CALCIUM 8.1* 8.0* 8.2* 8.1* 8.1*  MG  --   --   --   --  1.7   Liver Function Tests: Recent Labs  Lab 10/13/22 0428  AST 33  ALT 35  ALKPHOS 115  BILITOT 0.3  PROT 5.2*  ALBUMIN 2.1*   CBC: Recent Labs  Lab 10/08/22 0426 10/09/22 0341 10/10/22 0341 10/12/22 0257 10/13/22 0428  WBC 13.4* 10.8* 10.6* 9.5 10.8*  HGB 11.9* 10.8* 10.7* 11.1* 11.5*  HCT 36.7* 32.3* 33.1* 33.7* 35.4*  MCV 89.5 88.7 89.2 88.2 88.3  PLT 210 191 224 272 306   CBG: Recent Labs  Lab 10/13/22 0737 10/13/22 1121 10/13/22 1607 10/13/22 2142 10/14/22 0719  GLUCAP 137* 322* 123* 380* 207*   Hgb A1c No results for input(s): "HGBA1C" in the last 72 hours. Lipid Profile No results for input(s): "CHOL", "HDL", "LDLCALC", "TRIG", "CHOLHDL",  "LDLDIRECT" in the last 72 hours. Thyroid function studies No results for input(s): "TSH", "T4TOTAL", "T3FREE", "THYROIDAB" in the last 72 hours.  Invalid input(s): "FREET3" Urinalysis    Component Value Date/Time   COLORURINE YELLOW 10/05/2022 2125   APPEARANCEUR HAZY (A) 10/05/2022 2125   LABSPEC 1.010 10/05/2022 2125   PHURINE 5.0 10/05/2022 2125   GLUCOSEU 50 (A) 10/05/2022 2125   GLUCOSEU NEGATIVE 07/26/2022 1406   HGBUR NEGATIVE 10/05/2022 2125   BILIRUBINUR NEGATIVE 10/05/2022 2125   KETONESUR NEGATIVE 10/05/2022 2125   PROTEINUR NEGATIVE 10/05/2022 2125   UROBILINOGEN 0.2 07/26/2022 1406   NITRITE NEGATIVE 10/05/2022 2125   LEUKOCYTESUR NEGATIVE 10/05/2022 2125    FURTHER DISCHARGE INSTRUCTIONS:   Get Medicines reviewed and adjusted: Please take all your medications with you for your next visit with your Primary MD   Laboratory/radiological data: Please request your Primary MD to go over all hospital tests and procedure/radiological results at the follow up, please ask your Primary MD to get all Hospital records sent to his/her office.   In some cases, they will be blood work, cultures and biopsy results pending at the time of your discharge. Please request that your primary care M.D. goes through all the records of your hospital data and follows up on these results.   Also Note the following: If you experience worsening of your admission symptoms, develop shortness of breath, life threatening  emergency, suicidal or homicidal thoughts you must seek medical attention immediately by calling 911 or calling your MD immediately  if symptoms less severe.   You must read complete instructions/literature along with all the possible adverse reactions/side effects for all the Medicines you take and that have been prescribed to you. Take any new Medicines after you have completely understood and accpet all the possible adverse reactions/side effects.    Do not drive when taking  Pain medications or sleeping medications (Benzodaizepines)   Do not take more than prescribed Pain, Sleep and Anxiety Medications. It is not advisable to combine anxiety,sleep and pain medications without talking with your primary care practitioner   Special Instructions: If you have smoked or chewed Tobacco  in the last 2 yrs please stop smoking, stop any regular Alcohol  and or any Recreational drug use.   Wear Seat belts while driving.   Please note: You were cared for by a hospitalist during your hospital stay. Once you are discharged, your primary care physician will handle any further medical issues. Please note that NO REFILLS for any discharge medications will be authorized once you are discharged, as it is imperative that you return to your primary care physician (or establish a relationship with a primary care physician if you do not have one) for your post hospital discharge needs so that they can reassess your need for medications and monitor your lab values.  Time coordinating discharge: 40 minutes  SIGNED:  Marzetta Board, MD, PhD 10/14/2022, 9:53 AM

## 2022-10-14 NOTE — H&P (Incomplete)
Physical Medicine and Rehabilitation Admission H&P    Chief Complaint  Patient presents with   Altered Mental Status  : HPI: Nicholas Burton is a 89 year old right-handed male with history of dementia, type 2 diabetes mellitus, chronic diastolic congestive heart failure, hypertension, hyperlipidemia, CKD stage III, obesity with BMI 25.10.  Per chart review patient lives with spouse.  1 level home with basement.  3 steps to entry.  He had been needing some assist with ADLs over the past couple of weeks because of low back pain using a rolling walker for mobility tasks.  Patient has very good family social support.  Wife recently admitted for COPD to the hospital.  Presented 10/05/2022 with altered mental status over the last couple of days as well as increasing low back pain and bilateral lower extremity weakness.  Cranial CT scan showed no acute changes.  Noted atrophy and chronic small vessel ischemic changes of the white matter.  CT/MRI lumbar spine showed findings concerning for osteomyelitis discitis at the L3-4 level.  Abnormal enhancing material within the ventral epidural space at the level of L3 concerning for epidural phlegmon superimposed on underlying spondylosis and facet arthrosis resultant severe spinal stenosis.  Surrounding paraspinous edema and enhancement, but with no drainable soft tissue collection.  Findings favoring to reflect infection possible neoplastic process involving L3 vertebral body.  Incidental noted markedly distended urinary bladder with associated bladder wall trabeculation.  CT of the chest abdomen pelvis showed no evidence for metastatic disease in the chest abdomen or pelvis.  Admission chemistries unremarkable except WBC 13,800, potassium 3.3 glucose 215 creatinine 1.35, ammonia level less than 10, alcohol 11, sedimentation rate of 5..  Underwent biopsy which showed metastatic poorly differentiated prostate cancer with oligo metastatic disease to L3.  Palliative care  consulted to establish goals of care.  Plan currently is for XRT x 2 weeks.  He currently remains on Decadron therapy as directed.  Currently with Foley tube in place for urinary retention plan to remain in place until follow-up outpatient with urology services.  Tolerating a regular diet.  Therapy evaluations completed due to patient decreased functional mobility was admitted for a comprehensive rehab program.  Review of Systems  Constitutional:  Negative for chills and fever.  HENT:  Positive for hearing loss.   Eyes:  Negative for blurred vision and double vision.  Respiratory:  Negative for cough, shortness of breath and wheezing.   Cardiovascular:  Positive for leg swelling. Negative for chest pain and palpitations.  Gastrointestinal:  Positive for constipation. Negative for heartburn, nausea and vomiting.  Genitourinary:  Positive for frequency and urgency. Negative for dysuria, flank pain and hematuria.  Musculoskeletal:  Positive for back pain and myalgias.  Neurological:  Positive for weakness.  Psychiatric/Behavioral:  Positive for memory loss.   All other systems reviewed and are negative.  Past Medical History:  Diagnosis Date   Allergy    ANEMIA ASSOCIATED W/OTHER [facility] NUTRITIONAL DEFIC 08/11/2010   Qualifier: Diagnosis of  By: Ronnald Ramp MD, [name].    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  History of skin cancer    Hyperlipidemia with target LDL less than 100 11/10/2008        Hypertension    Memory loss 05/14/2009   Obesity (BMI 30.0-34.9) 04/01/2013   Osteoarthritis    PSA elevation 10/25/2018   SKIN CANCER, HX OF 11/10/2008    Qualifier: Diagnosis of  By: Ronnald Ramp MD, [name].    Snoring 04/03/2014   Type II diabetes mellitus with manifestations ([place]) 11/10/2008   Estimated Creatinine Clearance: 38 mL/min (A) (by C-G formula based on SCr of 1.52 mg/dL (H)).    Venous stasis dermatitis of both lower extremities 01/29/2018   Vitamin D deficiency 11/10/2008   Past Surgical History:  Procedure Laterality Date   CHOLECYSTECTOMY     INGUINAL HERNIA REPAIR     x 3   IR FLUORO GUIDED NEEDLE PLC ASPIRATION/INJECTION LOC  10/07/2022   JOINT REPLACEMENT     KNEE SURGERY     x 2   MELANOMA EXCISION     x 3 -- Left arm   TOTAL KNEE ARTHROPLASTY     x 3   Family History  Problem Relation Age of Onset   Heart disease Mother    Diabetes Mother    Heart disease Father    Cancer Sister    Cancer Brother    Diabetes Son    Diabetes Other    Social History:  reports that he has never smoked. He has never used smokeless tobacco. He reports that he does not drink alcohol and does not use drugs. Allergies:  Allergies  Allergen Reactions   Ace Inhibitors Cough   Oxycodone Itching and Rash   Penicillins Itching and Rash   Medications Prior to Admission  Medication Sig Dispense Refill   atorvastatin (LIPITOR) 10 MG tablet TAKE 1 TABLET DAILY 30 tablet 0   cholecalciferol (VITAMIN D) 1000 units tablet Take 1,000 Units by mouth daily.     Colesevelam HCl 3.75 g PACK MIX AND DRINK 1 PACKET DAILY (Patient taking differently: Take 1 packet by mouth daily.) 90 packet 0   Dietary Management Product (VASCULERA) TABS Take 1 tablet by mouth daily. 30 tablet 11   empagliflozin (JARDIANCE) 10 MG TABS tablet Take 1 tablet (10 mg total) by mouth daily before breakfast. 90 tablet 0   HYDROcodone-acetaminophen (NORCO/VICODIN) 5-325 MG tablet Take 1 tablet by mouth every 6 (six) hours as needed for moderate pain. 120 tablet 0   LANTUS SOLOSTAR 100 UNIT/ML Solostar Pen INJECT 0.3ML (30 UNITS TOTAL) INTO THE SKIN AT BEDTIME (Patient taking  differently: Inject 30 Units into the skin at bedtime.) 27 mL 1   losartan (COZAAR) 25 MG tablet TAKE 1 TABLET DAILY 30 tablet 0   metFORMIN (GLUCOPHAGE) 500 MG tablet TAKE 1 TABLET TWICE A DAY WITH MEALS (Patient taking differently: Take 500 mg by mouth 2 (two) times daily with a meal.) 180 tablet 0   methocarbamol (ROBAXIN) 500 MG tablet Take 500 mg by mouth in the morning and at bedtime.     metoprolol tartrate (LOPRESSOR) 25 MG tablet TAKE ONE-HALF (1/2) TABLET TWICE A DAY (Patient taking differently: Take 12.5 mg by mouth 2 (two) times daily.) 30 tablet 0   Multiple Vitamins-Minerals (PRESERVISION/LUTEIN) CAPS Take 1 capsule by mouth 2 (two) times daily.     nabumetone (RELAFEN) 750 MG tablet Take 750 mg by mouth 2 (two) times daily as needed for mild pain.     torsemide (DEMADEX) 10 MG tablet Take 1 tablet (10 mg total)  by mouth daily. Please call our office to schedule a yearly follow up for future refills. [phone]. Thank you (First attempt) (Patient taking differently: Take 10 mg by mouth daily.) 30 tablet 0   Vibegron (GEMTESA) 75 MG TABS TAKE 1 TABLET DAILY (Patient taking differently: Take 75 mg by mouth daily.) 90 tablet 1   vitamin B-12 (CYANOCOBALAMIN) 500 MCG tablet Take 500 mcg by mouth daily.        Home: Home Living Family/patient expects to be discharged to:: Private residence Living Arrangements: Spouse/significant other Available Help at Discharge: Family, Available 24 hours/day Type of Home: House Home Access: Stairs to enter [employer] of Steps: 3 Entrance Stairs-Rails: Can reach both [place]: One level, Laundry or work area in basement, Able to live on main level with bedroom/bathroom Bathroom Shower/Tub: Public librarian, Multimedia programmer: Standard Bathroom Accessibility: Yes Home Equipment: Conservation officer, nature (2 wheels), [employer] - single point, Financial controller: Sock aid, Reacher   Functional History: Prior  Function Prior Level of Function : History of Falls (last six months) Mobility Comments: Using RW for mobility tasks ADLs Comments: Needing more assist with ADL and iADL over the last 2 week because of LBP.  Functional Status:  Mobility: Bed Mobility Overal bed mobility: Needs Assistance Bed Mobility: Rolling, Sidelying to Sit Rolling: Mod assist Sidelying to sit: Min assist, Mod assist Sit to sidelying: Min guard General bed mobility comments: vc for sequencing, mod A for trunk elevation and to bring hips closer to the bed edge Transfers Overall transfer level: Needs assistance Equipment used: Rolling walker (2 wheels) Transfers: Sit to/from Stand Sit to Stand: Min guard Bed to/from chair/wheelchair/BSC transfer type:: Step pivot Step pivot transfers: Min guard General transfer comment: Hands on guarding for power up to full stand. No assist required. VC's for hand placement on seated surface for safety. Ambulation/Gait Ambulation/Gait assistance: Min guard Gait Distance (Feet): 200 Feet Assistive device: Rolling walker (2 wheels) Gait Pattern/deviations: Step-through pattern, Decreased stride length General Gait Details: Slow but generally steady with RW for support. Trunk flexed throughout due to kyphosis. Gait velocity: Decreased Gait velocity interpretation: 1.31 - 2.62 ft/sec, indicative of limited community ambulator    ADL: ADL Overall ADL's : Needs assistance/impaired Eating/Feeding: Independent, Sitting Grooming: Wash/dry hands, Standing, Min guard Grooming Details (indicate cue type and reason): sink level, vc for safety with RW Upper Body Dressing : Sitting, Minimal assistance Upper Body Dressing Details (indicate cue type and reason): donning gown on backside Lower Body Dressing: Minimal assistance, Sit to/from stand Toilet Transfer: Min guard, Regular Toilet, Rolling walker (2 wheels), Ambulation Toilet Transfer Details (indicate cue type and reason): vc for  use of grab bars for safety - safe hand placement with RW Toileting- Clothing Manipulation and Hygiene: Sitting/lateral lean, Min guard Toileting - Clothing Manipulation Details (indicate cue type and reason): vc for use of grab bar in lieu of RW Functional mobility during ADLs: Min guard, Rolling walker (2 wheels), Minimal assistance General ADL Comments: decreased activity tolerance, decreased balance  Cognition: Cognition Overall Cognitive Status: History of cognitive impairments - at baseline Orientation Level: Oriented to person, Oriented to place, Disoriented to situation, Disoriented to time Cognition Arousal/Alertness: Awake/alert Behavior During Therapy: WFL for tasks assessed/performed Overall Cognitive Status: History of cognitive impairments - at baseline General Comments: Very HOH  Physical Exam: Blood pressure 132/69, pulse 100, temperature 98.2 F (36.8 C), temperature source Oral, resp. rate 19, height 5' 9"$  (1.753 m), weight 77.1 kg, SpO2 96 %. Physical  Exam Genitourinary:    Comments: Foley tube in place. Neurological:     Comments: Patient is alert.  Very hard of hearing.  Follows commands.     Results for orders placed or performed during the hospital encounter of 10/05/22 (from the past 48 hour(s))  Glucose, capillary     Status: Abnormal   Collection Time: 10/12/22  7:55 AM  Result Value Ref Range   Glucose-Capillary 164 (H) 70 - 99 mg/dL    Comment: Glucose reference range applies only to samples taken after fasting for at least 8 hours.  Glucose, capillary     Status: Abnormal   Collection Time: 10/12/22 11:47 AM  Result Value Ref Range   Glucose-Capillary 225 (H) 70 - 99 mg/dL    Comment: Glucose reference range applies only to samples taken after fasting for at least 8 hours.  Glucose, capillary     Status: None   Collection Time: 10/12/22  6:07 PM  Result Value Ref Range   Glucose-Capillary 91 70 - 99 mg/dL    Comment: Glucose reference range  applies only to samples taken after fasting for at least 8 hours.  Glucose, capillary     Status: Abnormal   Collection Time: 10/12/22  8:45 PM  Result Value Ref Range   Glucose-Capillary 324 (H) 70 - 99 mg/dL    Comment: Glucose reference range applies only to samples taken after fasting for at least 8 hours.  Comprehensive metabolic panel     Status: Abnormal   Collection Time: 10/13/22  4:28 AM  Result Value Ref Range   Sodium 138 135 - 145 mmol/L   Potassium 3.6 3.5 - 5.1 mmol/L   Chloride 105 98 - 111 mmol/L   CO2 23 22 - 32 mmol/L   Glucose, Bld 100 (H) 70 - 99 mg/dL    Comment: Glucose reference range applies only to samples taken after fasting for at least 8 hours.   BUN 17 8 - 23 mg/dL   Creatinine, Ser 1.53 (H) 0.61 - 1.24 mg/dL   Calcium 8.1 (L) 8.9 - 10.3 mg/dL   Total Protein 5.2 (L) 6.5 - 8.1 g/dL   Albumin 2.1 (L) 3.5 - 5.0 g/dL   AST 33 15 - 41 U/L   ALT 35 0 - 44 U/L   Alkaline Phosphatase 115 38 - 126 U/L   Total Bilirubin 0.3 0.3 - 1.2 mg/dL   GFR, Estimated 42 (L) >60 mL/min    Comment: (NOTE) Calculated using the CKD-EPI Creatinine Equation (2021)    Anion gap 10 5 - 15    Comment: Performed at [facility], [place] [address], [place], [place] [address]  CBC     Status: Abnormal   Collection Time: 10/13/22  4:28 AM  Result Value Ref Range   WBC 10.8 (H) 4.0 - 10.5 K/uL   RBC 4.01 (L) 4.22 - 5.81 MIL/uL   Hemoglobin 11.5 (L) 13.0 - 17.0 g/dL   HCT 35.4 (L) 39.0 - 52.0 %   MCV 88.3 80.0 - 100.0 fL   MCH 28.7 26.0 - 34.0 pg   MCHC 32.5 30.0 - 36.0 g/dL   RDW 14.9 11.5 - 15.5 %   Platelets 306 150 - 400 K/uL   nRBC 0.0 0.0 - 0.2 %    Comment: Performed at [facility], [place] [address] [place], [place] [address]  Magnesium     Status: None   Collection Time: 10/13/22  4:28 AM  Result Value Ref Range  Magnesium 1.7 1.7 - 2.4 mg/dL    Comment: Performed at [facility], [place] [address], [place], [place] [address]  Glucose, capillary      Status: Abnormal   Collection Time: 10/13/22  7:37 AM  Result Value Ref Range   Glucose-Capillary 137 (H) 70 - 99 mg/dL    Comment: Glucose reference range applies only to samples taken after fasting for at least 8 hours.  Glucose, capillary     Status: Abnormal   Collection Time: 10/13/22 11:21 AM  Result Value Ref Range   Glucose-Capillary 322 (H) 70 - 99 mg/dL    Comment: Glucose reference range applies only to samples taken after fasting for at least 8 hours.  Glucose, capillary     Status: Abnormal   Collection Time: 10/13/22  4:07 PM  Result Value Ref Range   Glucose-Capillary 123 (H) 70 - 99 mg/dL    Comment: Glucose reference range applies only to samples taken after fasting for at least 8 hours.  Glucose, capillary     Status: Abnormal   Collection Time: 10/13/22  9:42 PM  Result Value Ref Range   Glucose-Capillary 380 (H) 70 - 99 mg/dL    Comment: Glucose reference range applies only to samples taken after fasting for at least 8 hours.   No results found.    Blood pressure 132/69, pulse 100, temperature 98.2 F (36.8 C), temperature source Oral, resp. rate 19, height 5' 9"$  (1.753 m), weight 77.1 kg, SpO2 96 %.  Medical Problem List and Plan: 1. Functional deficits secondary to metastatic poorly differentiated prostate cancer complicated by acute metabolic encephalopathy.  Follow-up oncology Dr.Kale as well as medical oncology Dr. Lisbeth Renshaw for plan XRT.  Continue Decadron therapy 4 mg daily for now  -patient may *** shower  -ELOS/Goals: *** 2.  Antithrombotics: -DVT/anticoagulation:  Mechanical: Antiembolism stockings, thigh (TED hose) Bilateral lower extremities  -antiplatelet therapy: N/A 3. Pain Management: Lidoderm patch as directed, hydrocodone/tramadol as needed 4. Mood/Behavior/Sleep: Melatonin nightly as needed  -antipsychotic agents: N/A 5. Neuropsych/cognition: This patient is capable of making decisions on his own behalf. 6. Skin/Wound Care: Routine skin  checks 7. Fluids/Electrolytes/Nutrition: Routine in and outs with follow-up chemistries 8.  Chronic diastolic congestive heart failure.  Monitor for any signs of fluid overload 9.  AKI on CKD/urinary retention.  Foley tube in place for now after failed voiding trial.  Follow-up outpatient urology services.  Continue Flomax 0.4 mg daily 10.  Diabetes mellitus.  Hemoglobin A1c 8.0.  NovoLog 3 units 3 times daily, Semglee 15 units nightly.  Check blood sugars before meals and at bedtime 11.  Hypertension.  Lopressor 25 mg twice daily.  Monitor with increased mobility 12.  Constipation.  MiraLAX daily.   Lavon Paganini Casimira Sutphin, PA-C 10/14/2022

## 2022-10-14 NOTE — Progress Notes (Signed)
PMR Admission Coordinator Pre-Admission Assessment   Patient: Nicholas Burton is an 89 y.o., male MRN: [id number] DOB: April 06, 1928 Height: 5' 9"$  (175.3 cm) Weight: 77.1 kg   Insurance Information HMO:     PPO:      PCP:      IPA:      80/20:      OTHER:  PRIMARY: Medicare A/B      Policy#: [id number]      Subscriber: pt CM Name:       Phone#:      Fax#:  Pre-Cert#: verified Civil engineer, contracting:  Benefits:  Phone #:      Name:  Eff. Date: 11/28/1994 A/B     Deduct: $1632       Out of Pocket Max: n/a      Life Max: n/a CIR: 100%      SNF: 20 full days Outpatient: 80%     Co-Pay: 20% Home Health: 100%      Co-Pay:  DME: 80%     Co-Pay: 20% Providers:  SECONDARY: Tricare for Life      Policy#: [id number]     Phone#: [phone]   Financial Counselor:       Phone#:    The "Data Collection Information Summary" for patients in Inpatient Rehabilitation Facilities with attached "Privacy Act [place] Records" was provided and verbally reviewed with: Patient and Family   Emergency Contact Information Contact Information       Name Relation Home Work [place] Spouse [phone]        Sequeira,Curtis Son     [phone]    Baily, Magro Relative     [place] Relative     [phone]           Current Medical History  Patient Admitting Diagnosis: discitis/myelopathy 2/2 metastatic prostate cancer   History of Present Illness: Nicholas Burton is a 89 year old right-handed male with history of dementia, type 2 diabetes mellitus, chronic diastolic congestive heart failure, hypertension, hyperlipidemia, CKD stage III, obesity with BMI 25.10.  Per chart review patient lives with spouse.  1 level home with basement.  3 steps to entry.  He had been needing some assist with ADLs over the past couple of weeks because of low back pain using a rolling walker for mobility tasks.  Patient has very good family social support.  Wife recently admitted for COPD to the  hospital.  Presented 10/05/2022 with altered mental status over the last couple of days as well as increasing low back pain and bilateral lower extremity weakness.  Cranial CT scan showed no acute changes.  Noted atrophy and chronic small vessel ischemic changes of the white matter.  CT/MRI lumbar spine showed findings concerning for osteomyelitis discitis at the L3-4 level.  Abnormal enhancing material within the ventral epidural space at the level of L3 concerning for epidural phlegmon superimposed on underlying spondylosis and facet arthrosis resultant severe spinal stenosis.  Surrounding paraspinous edema and enhancement, but with no drainable soft tissue collection.  Findings favoring to reflect infection possible neoplastic process involving L3 vertebral body.  Incidental noted markedly distended urinary bladder with associated bladder wall trabeculation.  CT of the chest abdomen pelvis showed no evidence for metastatic disease in the chest abdomen or pelvis.  Admission chemistries unremarkable except WBC 13,800, potassium 3.3 glucose 215 creatinine 1.35, ammonia level less than 10, alcohol 11, sedimentation rate of 5..  Underwent biopsy which showed metastatic poorly  differentiated prostate cancer with oligo metastatic disease to L3.  Palliative care consulted to establish goals of care.  Plan currently is for XRT x 2 weeks.  He currently remains on Decadron therapy as directed.  Currently with Foley tube in place for urinary retention plan to remain in place until follow-up outpatient with urology services.  Tolerating a regular diet.  Therapy evaluations completed due to patient decreased functional mobility was admitted for a comprehensive rehab program.  Complete NIHSS TOTAL: 3   [facility]'s [facility] record [facility] has been reviewed by the rehabilitation admission coordinator and physician.   Past Medical History      Past Medical History:  Diagnosis Date   Allergy     ANEMIA ASSOCIATED  W/OTHER [facility] NUTRITIONAL DEFIC 08/11/2010    Qualifier: Diagnosis of  By: Ronnald Ramp MD, [name].    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    History of skin cancer     Hyperlipidemia with target LDL less than 100 11/10/2008         Hypertension     Memory loss 05/14/2009   Obesity (BMI 30.0-34.9) 04/01/2013   Osteoarthritis     PSA elevation 10/25/2018   SKIN CANCER, HX OF 11/10/2008    Qualifier: Diagnosis of  By: Ronnald Ramp MD, [name].    Snoring 04/03/2014   Type II diabetes mellitus with manifestations ([place]) 11/10/2008    Estimated Creatinine Clearance: 38 mL/min (A) (by C-G formula based on SCr of 1.52 mg/dL (H)).    Venous stasis dermatitis of both lower extremities 01/29/2018   Vitamin D deficiency 11/10/2008      Has the patient had major surgery during 100 days prior to admission? No   Family History   family history includes Cancer in his brother and sister; Diabetes in his mother, son, and another family member; Heart disease in his father and mother.   Current Medications   Current Facility-Administered Medications:    acetaminophen (TYLENOL) tablet 1,000 mg, 1,000 mg, Oral, TID, Ferolito, Freada Bergeron, NP, 1,000 mg at 10/14/22 0750   Chlorhexidine Gluconate Cloth 2 % PADS 6 each, 6 each, Topical, Daily, Eulogio Bear U, DO, 6 each at 10/14/22 0800   dexamethasone (DECADRON) tablet 4 mg, 4 mg, Oral, Daily, Hayden Pedro, PA-C, 4 mg at 10/14/22 0756   fentaNYL (SUBLIMAZE) injection 12.5 mcg, 12.5 mcg, Intravenous, Q2H PRN, Howerter, Justin B, DO   HYDROcodone-acetaminophen (NORCO/VICODIN) 5-325 MG per tablet 1 tablet, 1 tablet,  Oral, Q6H PRN, Caren Griffins, MD, 1 tablet at 10/13/22 1133   insulin aspart (novoLOG) injection 0-15 Units, 0-15 Units, Subcutaneous, TID WC, Vann, Jessica U, DO, 5 Units at 10/14/22 0759   insulin aspart (novoLOG) injection 0-5 Units, 0-5 Units, Subcutaneous, QHS, Vann, Jessica U, DO, 5 Units at 10/13/22 2144   insulin aspart (novoLOG) injection 3 Units, 3 Units, Subcutaneous, TID WC, Vann, Jessica U, DO, 3 Units at 10/14/22 0757   insulin glargine-yfgn (SEMGLEE) injection 15 Units, 15 Units, Subcutaneous, QHS, Geradine Girt, DO, 15 Units at 10/13/22 2144  lidocaine (LIDODERM) 5 % 1 patch, 1 patch, Transdermal, Q24H, Ferolito, Freada Bergeron, NP, 1 patch at 10/14/22 0750   melatonin tablet 3 mg, 3 mg, Oral, QHS PRN, Howerter, Justin B, DO, 3 mg at 10/13/22 2145   metoprolol tartrate (LOPRESSOR) tablet 25 mg, 25 mg, Oral, BID, Vann, Jessica U, DO, 25 mg at 10/14/22 0749   naloxone (NARCAN) injection 0.4 mg, 0.4 mg, Intravenous, PRN, Howerter, Justin B, DO   naphazoline-glycerin (CLEAR EYES REDNESS) ophth solution 1-2 drop, 1-2 drop, Both Eyes, QID PRN, Cruzita Lederer, Vella Redhead, MD   ondansetron (ZOFRAN) injection 4 mg, 4 mg, Intravenous, Q6H PRN, Howerter, Justin B, DO   polyethylene glycol (MIRALAX / GLYCOLAX) packet 17 g, 17 g, Oral, Daily, Vann, Jessica U, DO, 17 g at 10/13/22 0857   tamsulosin (FLOMAX) capsule 0.4 mg, 0.4 mg, Oral, Daily, Vann, Jessica U, DO, 0.4 mg at 10/14/22 0749   traMADol (ULTRAM) tablet 25 mg, 25 mg, Oral, Q12H PRN, Drucilla Chalet, Freada Bergeron, NP   Patients Current Diet:  Diet Order                  Diet regular Room service appropriate? Yes; Fluid consistency: Thin  Diet effective now                         Precautions / Restrictions Precautions Precautions: Fall Restrictions Weight Bearing Restrictions: No    Has the patient had 2 or more falls or a fall with injury in the past year? Yes   Prior Activity Level Household: Per son, not moving as much at home,  RW for mobility, not driving, more assist for ADLs 2/2 back pain over the last 2 weeks   Prior Functional Level Self Care: Did the patient need help bathing, dressing, using the toilet or eating? Independent   Indoor Mobility: Did the patient need assistance with walking from room to room (with or without device)? Independent   Stairs: Did the patient need assistance with internal or external stairs (with or without device)? Independent   Functional Cognition: Did the patient need help planning regular tasks such as shopping or remembering to take medications? Independent   Patient Information Are you of Hispanic, Latino/a,or Spanish origin?: A. No, not of Hispanic, Latino/a, or Spanish origin What is your race?: A. White Do you need or want an interpreter to communicate with a doctor or health care staff?: 0. No   Patient's Response To:  Health Literacy and Transportation Is the patient able to respond to health literacy and transportation needs?: No (dementia) Health Literacy - How often do you need to have someone help you when you read instructions, pamphlets, or other written material from your doctor or pharmacy?: Patient unable to respond In the past 12 months, has lack of transportation kept you from medical appointments or from getting medications?: No (per proxy) In the past 12 months, has lack of transportation kept you from meetings, work, or from getting things needed for daily living?: No (per proxy)   Home Assistive Devices / Equipment Home Equipment: Conservation officer, nature (2 wheels), Garza-Salinas II - single point, Adaptive equipment   Prior Device Use: Indicate devices/aids used by the patient prior to current illness, exacerbation or injury? Walker   Current Functional Level Cognition   Overall Cognitive Status: History of cognitive impairments - at baseline Orientation Level: Oriented to person, Oriented to place, Disoriented to situation, Disoriented to time General Comments: Very  HOH    Extremity Assessment (  includes Sensation/Coordination)   Upper Extremity Assessment: Generalized weakness  Lower Extremity Assessment: Defer to PT evaluation     ADLs   Overall ADL's : Needs assistance/impaired Eating/Feeding: Independent, Sitting Grooming: Wash/dry hands, Standing, Min guard Grooming Details (indicate cue type and reason): sink level, vc for safety with RW Upper Body Dressing : Sitting, Minimal assistance Upper Body Dressing Details (indicate cue type and reason): donning gown on backside Lower Body Dressing: Minimal assistance, Sit to/from stand Toilet Transfer: Min guard, Regular Toilet, Rolling walker (2 wheels), Ambulation Toilet Transfer Details (indicate cue type and reason): vc for use of grab bars for safety - safe hand placement with RW Toileting- Clothing Manipulation and Hygiene: Sitting/lateral lean, Min guard Toileting - Clothing Manipulation Details (indicate cue type and reason): vc for use of grab bar in lieu of RW Functional mobility during ADLs: Min guard, Rolling walker (2 wheels), Minimal assistance General ADL Comments: decreased activity tolerance, decreased balance     Mobility   Overal bed mobility: Needs Assistance Bed Mobility: Rolling, Sidelying to Sit Rolling: Mod assist Sidelying to sit: Min assist, Mod assist Sit to sidelying: Min guard General bed mobility comments: Pt in bathroom with son upon arrival     Transfers   Overall transfer level: Needs assistance Equipment used: Rolling walker (2 wheels) Transfers: Sit to/from Stand Sit to Stand: Min guard Bed to/from chair/wheelchair/BSC transfer type:: Step pivot Step pivot transfers: Min guard General transfer comment: Pt demonstrated safe hand placement. Min guard for safety from lower surface     Ambulation / Gait / Stairs / Wheelchair Mobility   Ambulation/Gait Ambulation/Gait assistance: Supervision Gait Distance (Feet): 190 Feet Assistive device: Rolling walker (2  wheels) Gait Pattern/deviations: Step-through pattern, Trunk flexed General Gait Details: Cues for proximity to RW. Pt demonstrated step-through pattern with good cadance. Pt reported one occurance of L hip instability with no LOB. Gait velocity: Decreased Gait velocity interpretation: 1.31 - 2.62 ft/sec, indicative of limited community ambulator     Posture / Balance Dynamic Sitting Balance Sitting balance - Comments: can reach outside BOS without LOB Balance Overall balance assessment: Needs assistance Sitting-balance support: Feet supported, No upper extremity supported Sitting balance-Leahy Scale: Good Sitting balance - Comments: can reach outside BOS without LOB Standing balance support: Bilateral upper extremity supported, During functional activity Standing balance-Leahy Scale: Fair Standing balance comment: with RW support     Special needs/care consideration Diabetic management yes and Special service needs radiation scheduled    Previous Home Environment (from acute therapy documentation) Living Arrangements: Spouse/significant other Available Help at Discharge: Family, Available 24 hours/day Type of Home: House Home Layout: One level, Laundry or work area in basement, Able to live on main level with bedroom/bathroom Home Access: Stairs to enter Entrance Stairs-Rails: Can reach both Entrance Stairs-Number of Steps: 3 Bathroom Shower/Tub: Public librarian, Multimedia programmer: Standard Bathroom Accessibility: Yes How Accessible: Accessible via walker   Discharge Living Setting Plans for Discharge Living Setting: Patient's home, Lives with (comment) (spouse and great grand daughter) Type of Home at Discharge: House Discharge Home Layout: One level Discharge Home Access: Stairs to enter Entrance Stairs-Rails: Can reach both Entrance Stairs-Number of Steps: 3 Discharge Bathroom Shower/Tub: Tub/shower unit, Walk-in shower Discharge Bathroom Toilet:  Standard Discharge Bathroom Accessibility: Yes How Accessible: Accessible via walker Does the patient have any problems obtaining your medications?: No   Social/Family/Support Systems Anticipated Caregiver: son Vicente Serene is point of contact.  GGD Anderson [place] lives with pt and his spouse.  Numerous family members  will be providing assist Anticipated Caregiver's Contact Information: Atwood L. Vivian [phone] (lives in [place]); Vicente Serene [phone] Ability/Limitations of Caregiver: n/a Caregiver Availability: 24/7 Discharge Plan Discussed with Primary Caregiver: Yes Is Caregiver In Agreement with Plan?: Yes Does Caregiver/Family have Issues with Lodging/Transportation while Pt is in Rehab?: No   Goals Patient/Family Goal for Rehab: PT/OT supervision, SLP n/a Expected length of stay: 7-10 days Additional Information: New dx of metastatic prostate cancer, plan for palliative radiation starting 2/19.  Dispo: home to pt's home with spouse and GGD living in the home, numerous family members (4 sons and multiple grands/great grands) to provide additional support Pt/Family Agrees to Admission and willing to participate: Yes Program Orientation Provided & Reviewed with Pt/Caregiver Including Roles  & Responsibilities: Yes Additional Information Needs: f/u with Dr. Irene Limbo and Dr. Lisbeth Renshaw for oncology/radiation plans Information Needs to be Provided By: CSW  Barriers to Discharge: Medical stability, Home environment access/layout, Pending chemo/radiation   Decrease burden of Care through IP rehab admission: n/a   Possible need for SNF placement upon discharge: not anticipated.  Plan discharge home to pt's house where he will have his spouse and his great grand daughter 24/7, as well as numerous children/grand children providing additional support PRN.    Patient Condition: I have reviewed medical records from [facility], spoken with CM, and patient and son. I met with patient at the bedside for inpatient  rehabilitation assessment.  Patient will benefit from ongoing PT and OT, can actively participate in 3 hours of therapy a day 5 days of the week, and can make measurable gains during the admission.  Patient will also benefit from the coordinated team approach during an Inpatient Acute Rehabilitation admission.  The patient will receive intensive therapy as well as Rehabilitation physician, nursing, social worker, and care management interventions.  Due to bladder management, bowel management, safety, skin/wound care, disease management, medication administration, pain management, and patient education the patient requires 24 hour a day rehabilitation nursing.  The patient is currently min assist with mobility and basic ADLs.  Discharge setting and therapy post discharge at home with home health is anticipated.  Patient has agreed to participate in the Acute Inpatient Rehabilitation Program and will admit today.   Preadmission Screen Completed By:  Michel Santee, PT, DPT 10/14/2022 10:17 AM ______________________________________________________________________   Discussed status with Dr. Tressa Busman on 10/14/22  at 10:17 AM  and received approval for admission today.   Admission Coordinator:  Michel Santee, PT, DPT time 10:17 AM Sudie Grumbling 10/14/22     Assessment/Plan: Diagnosis: Does the need for close, 24 hr/day Medical supervision in concert with the patient's rehab needs make it unreasonable for this patient to be served in a less intensive setting? Yes Co-Morbidities requiring supervision/potential complications: metastatic prostate cancer with lyric destruction of L2-4, ?osteomyelitis s/p abx, hypokalemia, diastolic CHF, AKI with urinary retention, hypertension, DM2, memory loss.  Due to bladder management, bowel management, safety, skin/wound care, disease management, medication administration, pain management, and patient education, does the patient require 24 hr/day rehab nursing? Yes Does the  patient require coordinated care of a physician, rehab nurse, PT, OT, and SLP to address physical and functional deficits in the context of the above medical diagnosis(es)? Yes Addressing deficits in the following areas: balance, endurance, locomotion, strength, transferring, bowel/bladder control, bathing, dressing, feeding, grooming, toileting, cognition, and psychosocial support Can the patient actively participate in an intensive therapy program of at least 3 hrs of therapy 5 days a week? Yes The potential  for patient to make measurable gains while on inpatient rehab is good Anticipated functional outcomes upon discharge from inpatient rehab: supervision PT, supervision OT Estimated rehab length of stay to reach the above functional goals is: 7-10 days Anticipated discharge destination:  Home with family vs. Nursing home 10. Overall Rehab/Functional Prognosis: good     MD Signature:   Gertie Gowda, DO 10/14/2022

## 2022-10-14 NOTE — H&P (Signed)
Expand All Collapse All      Physical Medicine and Rehabilitation Admission H&P        Chief Complaint  Patient presents with   Altered Mental Status  : HPI: Nicholas Burton is a 89 year old right-handed male with history of dementia, type 2 diabetes mellitus, chronic diastolic congestive heart failure, hypertension, hyperlipidemia, CKD stage III, obesity with BMI 25.10.  Per chart review patient lives with spouse.  1 level home with basement.  3 steps to entry.  He had been needing some assist with ADLs over the past couple of weeks because of low back pain using a rolling walker for mobility tasks.  Patient has very good family social support.  Wife recently admitted for COPD to the hospital.  Presented 10/05/2022 with altered mental status over the last couple of days as well as increasing low back pain and bilateral lower extremity weakness.  Cranial CT scan showed no acute changes.  Noted atrophy and chronic small vessel ischemic changes of the white matter.  CT/MRI lumbar spine showed findings concerning for osteomyelitis discitis at the L3-4 level.  Abnormal enhancing material within the ventral epidural space at the level of L3 concerning for epidural phlegmon superimposed on underlying spondylosis and facet arthrosis resultant severe spinal stenosis.  Surrounding paraspinous edema and enhancement, but with no drainable soft tissue collection.  Findings favoring to reflect infection possible neoplastic process involving L3 vertebral body.  Incidental noted markedly distended urinary bladder with associated bladder wall trabeculation.  CT of the chest abdomen pelvis showed no evidence for metastatic disease in the chest abdomen or pelvis.  Admission chemistries unremarkable except WBC 13,800, potassium 3.3 glucose 215 creatinine 1.35, ammonia level less than 10, alcohol 11, sedimentation rate of 5..  Underwent biopsy which showed metastatic poorly differentiated prostate cancer with oligo metastatic  disease to L3.  Palliative care consulted to establish goals of care.  Plan currently is for XRT x 2 weeks.  He currently remains on Decadron therapy as directed.  Currently with Foley tube in place for urinary retention plan to remain in place until follow-up outpatient with urology services.  Tolerating a regular diet.  Therapy evaluations completed due to patient decreased functional mobility was admitted for a comprehensive rehab program.   Review of Systems  Constitutional:  Negative for chills and fever.  HENT:  Positive for hearing loss.   Eyes:  Negative for blurred vision and double vision.  Respiratory:  Negative for cough, shortness of breath and wheezing.   Cardiovascular:  Positive for leg swelling. Negative for chest pain and palpitations.  Gastrointestinal:  Positive for constipation. Negative for heartburn, nausea and vomiting.  Genitourinary:  Positive for frequency and urgency. Negative for dysuria, flank pain and hematuria.  Musculoskeletal:  Positive for back pain and myalgias.  Neurological:  Positive for weakness.  Psychiatric/Behavioral:  Positive for memory loss.   All other systems reviewed and are negative.       Past Medical History:  Diagnosis Date   Allergy     ANEMIA ASSOCIATED W/OTHER [facility] NUTRITIONAL DEFIC 08/11/2010    Qualifier: Diagnosis of  By: Ronnald Ramp MD, [name].    Cancer 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    History of skin cancer     Hyperlipidemia with target LDL less than 100 11/10/2008         Hypertension     Memory loss 05/14/2009   Obesity (BMI 30.0-34.9) 04/01/2013   Osteoarthritis     PSA  elevation 10/25/2018   SKIN CANCER, HX OF 11/10/2008    Qualifier: Diagnosis of  By: Ronnald Ramp MD, [name].    Snoring 04/03/2014   Type II diabetes mellitus with manifestations ([place]) 11/10/2008    Estimated Creatinine Clearance: 38 mL/min (A) (by C-G formula based on SCr of 1.52 mg/dL (H)).    Venous stasis dermatitis of both lower extremities 01/29/2018   Vitamin D deficiency 11/10/2008         Past Surgical History:  Procedure Laterality Date   CHOLECYSTECTOMY       INGUINAL HERNIA REPAIR        x 3   IR FLUORO GUIDED NEEDLE PLC ASPIRATION/INJECTION LOC   10/07/2022   JOINT REPLACEMENT       KNEE SURGERY        x 2   MELANOMA EXCISION        x 3 -- Left arm   TOTAL KNEE ARTHROPLASTY        x 3         Family History  Problem Relation Age of Onset   Heart disease Mother     Diabetes Mother     Heart disease Father     Cancer Sister     Cancer Brother     Diabetes Son     Diabetes Other      Social History:  reports that he has never smoked. He has never used smokeless tobacco. He reports that he does not drink alcohol and does not use drugs. Allergies:      Allergies  Allergen Reactions   Ace Inhibitors Cough   Oxycodone Itching and Rash   Penicillins Itching and Rash          Medications Prior to Admission  Medication Sig Dispense Refill   atorvastatin (LIPITOR) 10 MG tablet TAKE 1 TABLET DAILY 30 tablet 0   cholecalciferol (VITAMIN D) 1000 units tablet Take 1,000 Units by mouth daily.       Colesevelam HCl 3.75 g PACK MIX AND DRINK 1 PACKET DAILY (Patient taking differently: Take 1 packet by mouth daily.) 90 packet 0   Dietary Management Product (VASCULERA) TABS Take 1 tablet by mouth daily. 30 tablet 11   empagliflozin (JARDIANCE) 10 MG TABS tablet Take 1 tablet (10 mg total) by mouth daily before breakfast. 90 tablet 0   HYDROcodone-acetaminophen (NORCO/VICODIN) 5-325 MG tablet Take 1 tablet by mouth every 6 (six) hours as needed for moderate pain. 120 tablet 0   LANTUS  SOLOSTAR 100 UNIT/ML Solostar Pen INJECT 0.3ML (30 UNITS TOTAL) INTO THE SKIN AT BEDTIME (Patient taking differently: Inject 30 Units into the skin at bedtime.) 27 mL 1   losartan (COZAAR) 25 MG tablet TAKE 1 TABLET DAILY 30 tablet 0   metFORMIN (GLUCOPHAGE) 500 MG tablet TAKE 1 TABLET TWICE A DAY WITH MEALS (Patient taking differently: Take 500 mg by mouth 2 (two) times daily with a meal.) 180 tablet 0   methocarbamol (ROBAXIN) 500 MG tablet Take 500 mg by mouth in the morning and at bedtime.  metoprolol tartrate (LOPRESSOR) 25 MG tablet TAKE ONE-HALF (1/2) TABLET TWICE A DAY (Patient taking differently: Take 12.5 mg by mouth 2 (two) times daily.) 30 tablet 0   Multiple Vitamins-Minerals (PRESERVISION/LUTEIN) CAPS Take 1 capsule by mouth 2 (two) times daily.       nabumetone (RELAFEN) 750 MG tablet Take 750 mg by mouth 2 (two) times daily as needed for mild pain.       torsemide (DEMADEX) 10 MG tablet Take 1 tablet (10 mg total) by mouth daily. Please call our office to schedule a yearly follow up for future refills. [phone]. Thank you (First attempt) (Patient taking differently: Take 10 mg by mouth daily.) 30 tablet 0   Vibegron (GEMTESA) 75 MG TABS TAKE 1 TABLET DAILY (Patient taking differently: Take 75 mg by mouth daily.) 90 tablet 1   vitamin B-12 (CYANOCOBALAMIN) 500 MCG tablet Take 500 mcg by mouth daily.              Home: Home Living Family/patient expects to be discharged to:: Private residence Living Arrangements: Spouse/significant other Available Help at Discharge: Family, Available 24 hours/day Type of Home: House Home Access: Stairs to enter [employer] of Steps: 3 Entrance Stairs-Rails: Can reach both [place]: One level, Laundry or work area in basement, Able to live on main level with bedroom/bathroom Bathroom Shower/Tub: Public librarian, Multimedia programmer: Standard Bathroom Accessibility: Yes Home Equipment: Conservation officer, nature (2  wheels), [employer] - single point, Financial controller: Sock aid, Reacher   Functional History: Prior Function Prior Level of Function : History of Falls (last six months) Mobility Comments: Using RW for mobility tasks ADLs Comments: Needing more assist with ADL and iADL over the last 2 week because of LBP.   Functional Status:  Mobility: Bed Mobility Overal bed mobility: Needs Assistance Bed Mobility: Rolling, Sidelying to Sit Rolling: Mod assist Sidelying to sit: Min assist, Mod assist Sit to sidelying: Min guard General bed mobility comments: vc for sequencing, mod A for trunk elevation and to bring hips closer to the bed edge Transfers Overall transfer level: Needs assistance Equipment used: Rolling walker (2 wheels) Transfers: Sit to/from Stand Sit to Stand: Min guard Bed to/from chair/wheelchair/BSC transfer type:: Step pivot Step pivot transfers: Min guard General transfer comment: Hands on guarding for power up to full stand. No assist required. VC's for hand placement on seated surface for safety. Ambulation/Gait Ambulation/Gait assistance: Min guard Gait Distance (Feet): 200 Feet Assistive device: Rolling walker (2 wheels) Gait Pattern/deviations: Step-through pattern, Decreased stride length General Gait Details: Slow but generally steady with RW for support. Trunk flexed throughout due to kyphosis. Gait velocity: Decreased Gait velocity interpretation: 1.31 - 2.62 ft/sec, indicative of limited community ambulator   ADL: ADL Overall ADL's : Needs assistance/impaired Eating/Feeding: Independent, Sitting Grooming: Wash/dry hands, Standing, Min guard Grooming Details (indicate cue type and reason): sink level, vc for safety with RW Upper Body Dressing : Sitting, Minimal assistance Upper Body Dressing Details (indicate cue type and reason): donning gown on backside Lower Body Dressing: Minimal assistance, Sit to/from stand Toilet Transfer: Min guard,  Regular Toilet, Rolling walker (2 wheels), Ambulation Toilet Transfer Details (indicate cue type and reason): vc for use of grab bars for safety - safe hand placement with RW Toileting- Clothing Manipulation and Hygiene: Sitting/lateral lean, Min guard Toileting - Clothing Manipulation Details (indicate cue type and reason): vc for use of grab bar in lieu of RW Functional mobility during ADLs: Min guard, Rolling walker (2 wheels), Minimal  assistance General ADL Comments: decreased activity tolerance, decreased balance   Cognition: Cognition Overall Cognitive Status: History of cognitive impairments - at baseline Orientation Level: Oriented to person, Oriented to place, Disoriented to situation, Disoriented to time Cognition Arousal/Alertness: Awake/alert Behavior During Therapy: WFL for tasks assessed/performed Overall Cognitive Status: History of cognitive impairments - at baseline General Comments: Very HOH   Physical Exam: Blood pressure 132/69, pulse 100, temperature 98.2 F (36.8 C), temperature source Oral, resp. rate 19, height 5' 9"$  (1.753 m), weight 77.1 kg, SpO2 96 %. Physical Exam Constitutional: No apparent distress. Appropriate appearance for age.  HENT: No JVD. Neck Supple. Trachea midline. Atraumatic, normocephalic. Eyes: PERRLA. EOMI. Visual fields grossly intact.  Cardiovascular: RRR, no murmurs/rub/gallops. No Edema. Peripheral pulses 2+  Respiratory: CTAB. No rales, rhonchi, or wheezing. On RA.  Abdomen: + bowel sounds, normoactive. No distention or tenderness.  GU: Not examined. +Foley, draining clear urine.  Skin: C/D/I. No apparent lesions. MSK:      No apparent deformity.      Strength:                RUE: 5/5 SA, 5/5 EF, 5/5 EE, 5/5 WE, 5/5 FF, 5/5 FA                 LUE: 5/5 SA, 5/5 EF, 5/5 EE, 5/5 WE, 5/5 FF, 5/5 FA                 RLE: 5/5 HF, 5/5 KE, 5/5 DF, 5/5 EHL, 5/5 PF                 LLE:  3/5 HF, 4/5 KE, 5/5 DF, 5/5 EHL, 5/5 PF   Neurologic  exam:  Cognition: AAO to person, place, time and event.  Language: Fluent, No substitutions or neoglisms. No dysarthria. Names 3/3 objects correctly.  Memory: Recalls 1/3 objects at 5 minutes. + significant attention and memory deficits Insight: Poor insight into current condition.  Mood: Pleasant affect, appropriate mood.  Sensation: To light touch intact in BL UEs and LEs  Reflexes: 2+ in BL UE and LEs. Negative Hoffman's and babinski signs bilaterally.  CN: 2-12 grossly intact.  Coordination: No apparent tremors. No ataxia on FTN, HTS bilaterally.  Spasticity: MAS 0 in all extremities.       Lab Results Last 48 Hours        Results for orders placed or performed during the hospital encounter of 10/05/22 (from the past 48 hour(s))  Glucose, capillary     Status: Abnormal    Collection Time: 10/12/22  7:55 AM  Result Value Ref Range    Glucose-Capillary 164 (H) 70 - 99 mg/dL      Comment: Glucose reference range applies only to samples taken after fasting for at least 8 hours.  Glucose, capillary     Status: Abnormal    Collection Time: 10/12/22 11:47 AM  Result Value Ref Range    Glucose-Capillary 225 (H) 70 - 99 mg/dL      Comment: Glucose reference range applies only to samples taken after fasting for at least 8 hours.  Glucose, capillary     Status: None    Collection Time: 10/12/22  6:07 PM  Result Value Ref Range    Glucose-Capillary 91 70 - 99 mg/dL      Comment: Glucose reference range applies only to samples taken after fasting for at least 8 hours.  Glucose, capillary     Status: Abnormal    Collection Time:  10/12/22  8:45 PM  Result Value Ref Range    Glucose-Capillary 324 (H) 70 - 99 mg/dL      Comment: Glucose reference range applies only to samples taken after fasting for at least 8 hours.  Comprehensive metabolic panel     Status: Abnormal    Collection Time: 10/13/22  4:28 AM  Result Value Ref Range    Sodium 138 135 - 145 mmol/L    Potassium 3.6 3.5 - 5.1  mmol/L    Chloride 105 98 - 111 mmol/L    CO2 23 22 - 32 mmol/L    Glucose, Bld 100 (H) 70 - 99 mg/dL      Comment: Glucose reference range applies only to samples taken after fasting for at least 8 hours.    BUN 17 8 - 23 mg/dL    Creatinine, Ser 1.53 (H) 0.61 - 1.24 mg/dL    Calcium 8.1 (L) 8.9 - 10.3 mg/dL    Total Protein 5.2 (L) 6.5 - 8.1 g/dL    Albumin 2.1 (L) 3.5 - 5.0 g/dL    AST 33 15 - 41 U/L    ALT 35 0 - 44 U/L    Alkaline Phosphatase 115 38 - 126 U/L    Total Bilirubin 0.3 0.3 - 1.2 mg/dL    GFR, Estimated 42 (L) >60 mL/min      Comment: (NOTE) Calculated using the CKD-EPI Creatinine Equation (2021)      Anion gap 10 5 - 15      Comment: Performed at [facility], [place] [address], [place], [place] [address]  CBC     Status: Abnormal    Collection Time: 10/13/22  4:28 AM  Result Value Ref Range    WBC 10.8 (H) 4.0 - 10.5 K/uL    RBC 4.01 (L) 4.22 - 5.81 MIL/uL    Hemoglobin 11.5 (L) 13.0 - 17.0 g/dL    HCT 35.4 (L) 39.0 - 52.0 %    MCV 88.3 80.0 - 100.0 fL    MCH 28.7 26.0 - 34.0 pg    MCHC 32.5 30.0 - 36.0 g/dL    RDW 14.9 11.5 - 15.5 %    Platelets 306 150 - 400 K/uL    nRBC 0.0 0.0 - 0.2 %      Comment: Performed at [facility], [place] [address], [place], [place] [address]  Magnesium     Status: None    Collection Time: 10/13/22  4:28 AM  Result Value Ref Range    Magnesium 1.7 1.7 - 2.4 mg/dL      Comment: Performed at [facility] [address], [place], [place] [address]  Glucose, capillary     Status: Abnormal    Collection Time: 10/13/22  7:37 AM  Result Value Ref Range    Glucose-Capillary 137 (H) 70 - 99 mg/dL      Comment: Glucose reference range applies only to samples taken after fasting for at least 8 hours.  Glucose, capillary     Status: Abnormal    Collection Time: 10/13/22 11:21 AM  Result Value Ref Range    Glucose-Capillary 322 (H) 70 - 99 mg/dL      Comment: Glucose reference range applies only to samples taken after  fasting for at least 8 hours.  Glucose, capillary     Status: Abnormal    Collection Time: 10/13/22  4:07 PM  Result Value Ref Range    Glucose-Capillary 123 (H) 70 - 99  mg/dL      Comment: Glucose reference range applies only to samples taken after fasting for at least 8 hours.  Glucose, capillary     Status: Abnormal    Collection Time: 10/13/22  9:42 PM  Result Value Ref Range    Glucose-Capillary 380 (H) 70 - 99 mg/dL      Comment: Glucose reference range applies only to samples taken after fasting for at least 8 hours.      Imaging Results (Last 48 hours)  No results found.         Blood pressure 132/69, pulse 100, temperature 98.2 F (36.8 C), temperature source Oral, resp. rate 19, height 5' 9"$  (1.753 m), weight 77.1 kg, SpO2 96 %.   Medical Problem List and Plan: 1. Functional deficits secondary to metastatic poorly differentiated prostate cancer complicated by acute metabolic encephalopathy.  Follow-up oncology Dr.Kale as well as medical oncology Dr. Lisbeth Renshaw for plan XRT.  Continue Decadron therapy 4 mg daily for now             -patient may shower             -ELOS/Goals: 7-10 days 2.  Antithrombotics: -DVT/anticoagulation:  Mechanical: Antiembolism stockings, thigh (TED hose) Bilateral lower extremities             -antiplatelet therapy: N/A 3. Pain Management: Lidoderm patch as directed, hydrocodone/tramadol as needed 4. Mood/Behavior/Sleep: Melatonin nightly as needed             -antipsychotic agents: N/A 5. Neuropsych/cognition: This patient is capable of making decisions on his own behalf. 6. Skin/Wound Care: Routine skin checks 7. Fluids/Electrolytes/Nutrition: Routine in and outs with follow-up chemistries 8.  Chronic diastolic congestive heart failure.  Monitor for any signs of fluid overload 9.  AKI on CKD/urinary retention.  Foley tube in place for now after failed voiding trial.  Follow-up outpatient urology services.  Continue Flomax 0.4 mg daily 10.   Diabetes mellitus.  Hemoglobin A1c 8.0.  NovoLog 3 units 3 times daily, Semglee 15 units nightly.  Check blood sugars before meals and at bedtime Recent Labs    10/14/22 0719 10/14/22 1119 10/14/22 1616  GLUCAP 207* 300* 486*    - increase Semglee to 18U on admission   11.  Hypertension.  Lopressor 25 mg twice daily.  Monitor with increased mobility 12.  Constipation.  MiraLAX daily. 13. Urinary retention. Inserted 2/11. DC foley trial on admission.      Lavon Paganini Angiulli, PA-C 10/14/2022  I have examined the patient independently and edited the note for HPI, ROS, exam, assessment, and plan as appropriate. I am in agreement with the above recommendations.   Gertie Gowda, DO 10/14/2022

## 2022-10-15 DIAGNOSIS — C61 Malignant neoplasm of prostate: Secondary | ICD-10-CM | POA: Diagnosis not present

## 2022-10-15 LAB — GLUCOSE, CAPILLARY
Glucose-Capillary: 161 mg/dL — ABNORMAL HIGH (ref 70–99)
Glucose-Capillary: 226 mg/dL — ABNORMAL HIGH (ref 70–99)
Glucose-Capillary: 409 mg/dL — ABNORMAL HIGH (ref 70–99)
Glucose-Capillary: 336 mg/dL — ABNORMAL HIGH (ref 70–99)
Glucose-Capillary: 136 mg/dL — ABNORMAL HIGH (ref 70–99)

## 2022-10-15 MED ORDER — CHLORHEXIDINE GLUCONATE CLOTH 2 % EX PADS
6.0000 | MEDICATED_PAD | Freq: Every day | CUTANEOUS | Status: DC
  Administered 2022-10-15 – 2022-10-17 (×3): 6 via TOPICAL

## 2022-10-15 MED ORDER — INSULIN GLARGINE-YFGN 100 UNIT/ML ~~LOC~~ SOLN
19.0000 [IU] | Freq: Every day | SUBCUTANEOUS | Status: DC
  Administered 2022-10-15: 19 [IU] via SUBCUTANEOUS
  Filled 2022-10-15 (×2): qty 0.19

## 2022-10-15 MED ORDER — MAGNESIUM GLUCONATE 500 MG PO TABS
250.0000 mg | ORAL_TABLET | Freq: Every day | ORAL | Status: DC
  Administered 2022-10-15 – 2022-10-17 (×3): 250 mg via ORAL
  Filled 2022-10-15 (×3): qty 1

## 2022-10-15 MED ORDER — VITAMIN D (ERGOCALCIFEROL) 1.25 MG (50000 UNIT) PO CAPS
50000.0000 [IU] | ORAL_CAPSULE | ORAL | Status: DC
  Administered 2022-10-15: 50000 [IU] via ORAL

## 2022-10-15 MED ORDER — LIDOCAINE HCL URETHRAL/MUCOSAL 2 % EX GEL
1.0000 | Freq: Four times a day (QID) | CUTANEOUS | Status: DC | PRN
  Administered 2022-10-15: 1 via URETHRAL
  Filled 2022-10-15: qty 12

## 2022-10-15 NOTE — Progress Notes (Signed)
Physical Therapy Session Note  Patient Details  Name: Nicholas Burton MRN: [id number] Date of Birth: 09-Mar-1928  Today's Date: 10/15/2022 PT Individual Time: 1300-1356 PT Individual Time Calculation (min): 56 min   [place] Term Goals: Week 1:     Skilled Therapeutic Interventions/Progress Updates:      Pt supine in bed to start - in agreement to therapy session. Son's at the bedside who assisted with donning his shoes. Supine<>sitting EOB with minA for primarily trunk support. Able to scoot to EOB without assist. Sit<>stand to RW with CGA - PT managing foley cath. Ambulated to main rehab gym, ~196f, with supervision and RW - gait speed appropriate for age - cues for keeping body within walker frame. Pt ambulated throughout session using RW b/w therapy gyms - CGA to supervision for all ambulation.   Gait training in ADL apartment on carpeted surfaces and practiced navigating around home environment - CGA for safety. Furniture transfers from low sitting sofa couch and recliner - needing minA to power to rise.   Stair training using 6inch steps with 2 hand rails - navigated x8 steps with CGA/minA overall - needs cues for sequencing as his L knee buckles if it leads ascent. Self selects reciprocal stepping and needs education on step-to pattern for fall prevention.  Standing there-ex in // bars -1x12 mini squats -1x15 heel raises -1x15 hip abduction, bilaterally -1x15 alternating high knees  setupA on Nustep - assist for placing feet in paddles - completed 2 minutes at L2 resistance, patient requesting to return to room and lie down 2/2 fatigue. Gentle encouragement provided to maximize therapy time.   Ambulated back to his room with supervision and RW - bed mobility completed without assist - although somewhat difficult for him to get his legs into the bed. Patient made comfortable, son's at the bedside, all needs met.   Therapy Documentation Precautions:  Restrictions Weight Bearing  Restrictions: No General:      Therapy/Group: Individual Therapy  Arayla Kruschke P Kadir Azucena PT 10/15/2022, 7:44 AM

## 2022-10-15 NOTE — Progress Notes (Signed)
After multiple attempts to urinate on the [facility], urinal, and the toilet - both sitting and standing, patient complained of pain in lower abdomen. This nurse had tech bladder scan patient and he scanned over 556m. NT straight cathed patient for 7021m Patient felt relief in lower abdomen and is resting comfortably.

## 2022-10-15 NOTE — Evaluation (Signed)
Physical Therapy Assessment and Plan  Patient Details  Name: Nicholas Burton MRN: [name]:[id number] Date of Birth: 1928-07-24  PT Diagnosis: Difficulty walking, Low back pain, and Muscle weakness Rehab Potential: Good ELOS: 7-10 days   Today's Date: 10/15/2022 PT Individual Time: [id number] PT Individual Time Calculation (min): 70 min    Hospital Problem: Principal Problem:   Prostate cancer [facility])   Past Medical History:  Past Medical History:  Diagnosis Date   Allergy    ANEMIA ASSOCIATED W/OTHER [facility] NUTRITIONAL DEFIC 08/11/2010   Qualifier: Diagnosis of  By: Ronnald Ramp MD, [name].    Cancer [facility])    skin   Cataract    Chronic combined systolic and diastolic congestive heart failure ([place]) 10/30/2018   COPD with asthma 03/02/2017   CRI (chronic renal insufficiency), stage 3 (moderate) (HCC) 10/25/2018   Deficiency anemia 10/25/2018   Diabetes mellitus    type 2   DOE (dyspnea on exertion) 09/03/2018   Edema 06/11/2009   Qualifier: Diagnosis of  By: Ronnald Ramp MD, [name].    History of skin cancer    Hyperlipidemia with target LDL less than 100 11/10/2008        Hypertension    Memory loss 05/14/2009   Obesity (BMI 30.0-34.9) 04/01/2013   Osteoarthritis    PSA elevation 10/25/2018   SKIN CANCER, HX OF 11/10/2008   Qualifier: Diagnosis of  By: Ronnald Ramp MD, [name].    Snoring 04/03/2014   Type II diabetes mellitus with manifestations ([place]) 11/10/2008   Estimated Creatinine Clearance: 38 mL/min (A) (by C-G formula based on SCr of 1.52 mg/dL (H)).    Venous stasis dermatitis of both lower extremities 01/29/2018   Vitamin D deficiency 11/10/2008   Past Surgical History:  Past Surgical History:  Procedure Laterality Date   CHOLECYSTECTOMY     INGUINAL HERNIA REPAIR     x 3   IR FLUORO GUIDED NEEDLE PLC ASPIRATION/INJECTION LOC  10/07/2022   JOINT REPLACEMENT     KNEE SURGERY     x 2   MELANOMA EXCISION     x 3 -- Left arm   TOTAL KNEE ARTHROPLASTY     x 3    Assessment & Plan Clinical  Impression: Patient is a 89 y.o. right-handed male with history of dementia, type 2 diabetes mellitus, chronic diastolic congestive heart failure, hypertension, hyperlipidemia, CKD stage III, obesity with BMI 25.10.  Per chart review patient lives with spouse.  1 level home with basement.  3 steps to entry.  He had been needing some assist with ADLs over the past couple of weeks because of low back pain using a rolling walker for mobility tasks.  Patient has very good family social support.  Wife recently admitted for COPD to the hospital.  Presented 10/05/2022 with altered mental status over the last couple of days as well as increasing low back pain and bilateral lower extremity weakness.  Cranial CT scan showed no acute changes.  Noted atrophy and chronic small vessel ischemic changes of the white matter.  CT/MRI lumbar spine showed findings concerning for osteomyelitis discitis at the L3-4 level.  Abnormal enhancing material within the ventral epidural space at the level of L3 concerning for epidural phlegmon superimposed on underlying spondylosis and facet arthrosis resultant severe spinal stenosis.  Surrounding paraspinous edema and enhancement, but with no drainable soft tissue collection.  Findings favoring to reflect infection possible neoplastic process involving L3 vertebral body.  Incidental noted markedly distended urinary bladder with associated bladder wall  trabeculation.  CT of the chest abdomen pelvis showed no evidence for metastatic disease in the chest abdomen or pelvis.  Admission chemistries unremarkable except WBC 13,800, potassium 3.3 glucose 215 creatinine 1.35, ammonia level less than 10, alcohol 11, sedimentation rate of 5..  Underwent biopsy which showed metastatic poorly differentiated prostate cancer with oligo metastatic disease to L3.  Palliative care consulted to establish goals of care.  Plan currently is for XRT x 2 weeks.  He currently remains on Decadron therapy as directed.   Currently with Foley tube in place for urinary retention plan to remain in place until follow-up outpatient with urology services.  Tolerating a regular diet.  Therapy evaluations completed due to patient decreased functional mobility was admitted for a comprehensive rehab program.  Patient transferred to CIR on 10/14/2022 .   Patient currently requires  CGA  with mobility secondary to muscle weakness, ,, and decreased standing balance and decreased balance strategies.  Prior to hospitalization, patient was modified independent  with mobility and lived with Spouse, Family in a House home.  Home access is 3Stairs to enter.  Patient will benefit from skilled PT intervention to maximize safe functional mobility, minimize fall risk, and decrease caregiver burden for planned discharge home with 24 hour supervision.  Anticipate patient will benefit from follow up [place] at discharge.  PT - End of Session Activity Tolerance: Tolerates 30+ min activity with multiple rests Endurance Deficit: Yes PT Assessment Rehab Potential (ACUTE/IP ONLY): Good PT Barriers to Discharge: [place] home environment;Decreased caregiver support;Home environment access/layout;Insurance for SNF coverage;Pending chemo/radiation PT Patient demonstrates impairments in the following area(s): Balance;Endurance;Motor;Pain;Safety;Sensory PT Transfers Functional Problem(s): Bed Mobility;Bed to Chair;Car;Furniture PT Locomotion Functional Problem(s): Ambulation;Wheelchair Mobility;Stairs PT Plan PT Intensity: Minimum of 1-2 x/day ,45 to 90 minutes PT Frequency: 5 out of 7 days PT Duration Estimated Length of Stay: 7-10 days PT Treatment/Interventions: Ambulation/gait training;Community reintegration;DME/adaptive equipment instruction;Neuromuscular re-education;Psychosocial support;Stair training;UE/LE Strength taining/ROM;Wheelchair propulsion/positioning;UE/LE Coordination activities;Therapeutic Activities;Skin care/wound  management;Pain management;Discharge planning;Balance/vestibular training;Cognitive remediation/compensation;Patient/family education;Functional mobility training;Disease management/prevention;Splinting/orthotics;Therapeutic Exercise;Visual/perceptual remediation/compensation PT Transfers Anticipated Outcome(s): supervision PT Locomotion Anticipated Outcome(s): supervision PT Recommendation Recommendations for Other Services: Therapeutic Recreation consult Therapeutic Recreation Interventions: Pet therapy;Stress management Follow Up Recommendations: Home health PT Patient destination: Home Equipment Recommended: To be determined   PT Evaluation Precautions/Restrictions Precautions Precautions: Fall Precaution Comments: no brace yet, scheduled to begin radiation tx on Monday 2/19 Restrictions Weight Bearing Restrictions: No General   Vital Signs Pain Pain Assessment Pain Scale: 0-10 Pain Score: 5  Pain Type: Acute pain;Chronic pain Pain Location: Back (low back and L hip) Pain Orientation: Left;Lower Pain Descriptors / Indicators: Aching;Discomfort Pain Onset: On-going Patients Stated Pain Goal: 0 Multiple Pain Sites: No Pain Interference Pain Interference Pain Effect on Sleep: 3. Frequently Pain Interference with Therapy Activities: 2. Occasionally Pain Interference with Day-to-Day Activities: 3. Frequently;2. Occasionally Home Living/Prior Functioning Home Living Available Help at Discharge: Family;Available 24 hours/day Type of Home: House Home Access: Stairs to enter [employer] of Steps: 3 Entrance Stairs-Rails: Can reach both Home Layout: One level;Laundry or work area in basement;Able to live on main level with bedroom/bathroom Bathroom Shower/Tub: Tub/shower unit;Walk-in shower Bathroom Toilet: Handicapped height Bathroom Accessibility: Yes  Lives With: Spouse;Family Prior Function Level of Independence: Independent with basic ADLs;Independent with  transfers;Requires assistive device for independence;Independent with gait (Pt relates he was IND with ADLs)  Able to Take Stairs?: Yes Driving: No Vocation: Retired Radiographer, therapeutic - History Ability to See in Adequate Light: 2 Moderately impaired Perception Perception: Within  Functional Limits Praxis Praxis: Intact  Cognition Overall Cognitive Status: History of cognitive impairments - at baseline Arousal/Alertness: Awake/alert Orientation Level: Oriented to person;Oriented to place;Oriented to time;Oriented to situation Memory: Impaired Awareness: Appears intact Safety/Judgment: Impaired Sensation Sensation Light Touch: Appears Intact Coordination Gross Motor Movements are Fluid and Coordinated: No Fine Motor Movements are Fluid and Coordinated: No Coordination and Movement Description: LBP prevents smooth movement patterns Heel Shin Test: WFL within pain tolerance Motor  Motor Motor: Abnormal postural alignment and control Motor - Skilled Clinical Observations: age-related and pain-related decrease in initiation and speed of movement   Trunk/Postural Assessment  Cervical Assessment Cervical Assessment: Exceptions to [facility] (forward head) Thoracic Assessment Thoracic Assessment: Exceptions to [facility] (kyphotic) Lumbar Assessment Lumbar Assessment: Exceptions to [facility] (decreased lordosis with neutral to posterior pelvic tilt most likely d/t pain) Postural Control Postural Control: Within Functional Limits  Balance Standardized Balance Assessment Standardized Balance Assessment: Berg Balance Test Berg Balance Test Sit to Stand: Able to stand  independently using hands Sitting with Back Unsupported but Feet Supported on Floor or Stool: Able to sit 2 minutes under supervision (LBP) Stand to Sit: Controls descent by using hands Transfers: Able to transfer with verbal cueing and /or supervision From Standing, Reach Forward with Outstretched Arm: Can reach forward >5 cm  safely (2") From Standing Position, Pick up Object from Floor: Able to pick up shoe, needs supervision Extremity Assessment      RLE Assessment RLE Assessment: Exceptions to [facility] General Strength Comments: grossly 4/ 5 LLE Assessment LLE Assessment: Exceptions to [facility] General Strength Comments: grossly 4-/ 5 and limited by pain  Care Tool Care Tool Bed Mobility Roll left and right activity   Roll left and right assist level: Supervision/Verbal cueing    Sit to lying activity   Sit to lying assist level: Supervision/Verbal cueing    Lying to sitting on side of bed activity   Lying to sitting on side of bed assist level: the ability to move from lying on the back to sitting on the side of the bed with no back support.: Supervision/Verbal cueing     Care Tool Transfers Sit to stand transfer   Sit to stand assist level: Contact Guard/Touching assist    Chair/bed transfer   Chair/bed transfer assist level: Contact Guard/Touching assist     Toilet transfer   Assist Level: Contact Guard/Touching assist    Car transfer   Car transfer assist level: Contact Guard/Touching assist      Care Tool Locomotion Ambulation   Assist level: Contact Guard/Touching assist Assistive device: Walker-rolling Max distance: 200 ft  Walk 10 feet activity   Assist level: Contact Guard/Touching assist Assistive device: Walker-rolling   Walk 50 feet with 2 turns activity   Assist level: Contact Guard/Touching assist Assistive device: Walker-rolling  Walk 150 feet activity   Assist level: Contact Guard/Touching assist Assistive device: Walker-rolling  Walk 10 feet on uneven surfaces activity Walk 10 feet on uneven surfaces activity did not occur: Safety/medical concerns      Stairs   Assist level: Contact Guard/Touching assist Stairs assistive device: 2 hand rails Max number of stairs: 8 (3" steps)  Walk up/down 1 step activity   Walk up/down 1 step (curb) assist level: Contact  Guard/Touching assist Walk up/down 1 step or curb assistive device: 2 hand rails  Walk up/down 4 steps activity   Walk up/down 4 steps assist level: Contact Guard/Touching assist Walk up/down 4 steps assistive device: 2 hand rails  Walk up/down 12 steps  activity Walk up/down 12 steps activity did not occur: Safety/medical concerns      Pick up small objects from floor   Pick up small object from the floor assist level: Contact Guard/Touching assist Pick up small object from the floor assistive device: RW  Wheelchair Is the patient using a wheelchair?: No Type of Wheelchair: Manual Wheelchair activity did not occur: Refused      Wheel 50 feet with 2 turns activity Wheelchair 50 feet with 2 turns activity did not occur: Refused    Wheel 150 feet activity Wheelchair 150 feet activity did not occur: Refused      Refer to Care Plan for Long Term Goals  [place] TERM GOAL WEEK 1 PT [place] Term Goal 1 (Week 1): STG=LTG d/t ELOS  Recommendations for other services: Therapeutic Recreation  Pet therapy and Stress management  Skilled Therapeutic Intervention Mobility Bed Mobility Bed Mobility: Rolling Right;Rolling Left;Left Sidelying to Sit;Supine to Sit;Sit to Supine Rolling Right: Supervision/verbal cueing;Contact Guard/Touching assist Rolling Left: Supervision/Verbal cueing;Contact Guard/Touching assist Left Sidelying to Sit: Supervision/Verbal cueing Supine to Sit: Supervision/Verbal cueing Sit to Supine: Supervision/Verbal cueing Transfers Transfers: Sit to Stand;Stand to Sit;Stand Pivot Transfers Sit to Stand: Contact Guard/Touching assist Stand to Sit: Contact Guard/Touching assist Stand Pivot Transfers: Contact Guard/Touching assist Transfer (Assistive device): Rolling walker Locomotion  Gait Ambulation: Yes Gait Assistance: Contact Guard/Touching assist Gait Distance (Feet): 200 Feet Assistive device: Rolling walker Gait Gait: Yes Gait Pattern: Decreased stride  length;Decreased hip/knee flexion - left;Decreased dorsiflexion - right Gait velocity: Decreased Stairs / Additional Locomotion Stairs: Yes Stairs Assistance: Contact Guard/Touching assist Stair Management Technique: Two rails Number of Stairs: 8 Height of Stairs: 3 Wheelchair Mobility Wheelchair Mobility: No  PT Evaluation completed; see above for results. PT educated patient in roles of PT vs OT, PT POC, rehab potential, rehab goals, and discharge recommendations along with recommendation for follow-up rehabilitation services. Individual treatment initiated:  Patient supine in bed upon PT arrival. Patient alert and agreeable to PT session.   Therapeutic Activity: Bed Mobility: Patient performed supine to/from sit with supervision and use of bedrails requiring extra time. Pain in lumbar reduces amount that pt bends or twists.  While seated EOB, pt dons boxer shorts and pants with supervision/ setup.  Transfers: Patient performed sit <> stand and stand pivot transfers with CGA and use of RW for balance. Pt demos reduced self efficacy with balance and reaches out for hand holds when RW not near. Provided min vc/tc for safe RW mgmt.  Gait Training:  Patient ambulated 110' x1/ 100' x1/ 200' x1 using RW with CGA throughout. No LOB noted. Son providing CGA with use of gait belt on return trip to room.  Ambulated with flexed posture and decreased step length/ height. Son able to provide appropriate CGA at gait belt throughout ambulation  bout.   Able to complete eight 3" steps with CGA and use of BHR. Pt self chooses to reciprocate stepping pattern for portion of ascent and uses step-to pattern for descent. Relates that sons usually help him with steps. Sons relate that they are building ramped entry for both parents.   Neuromuscular Re-ed: NMR facilitated during session with focus on standing balance. Pt guided in aspects of Berg Balance. See navigator for completed tasks. NMR performed for  improvements in motor control and coordination, balance, sequencing, judgement, and self confidence/ efficacy in performing all aspects of mobility at highest level of independence.   Patient seated upright in recliner at end of session with brakes  locked, belt alarm set, and all needs within reach. Sons with additional info and questions re: radiation treatment and effects and what therapy will do to assist. Education provided re: general side effects of radiation and need for increased energy, stamina and strength in order to continue at higher level of mobility throughout and after treatment.   Discharge Criteria: Patient will be discharged from PT if patient refuses treatment 3 consecutive times without medical reason, if treatment goals not met, if there is a change in medical status, if patient makes no progress towards goals or if patient is discharged from hospital.  The above assessment, treatment plan, treatment alternatives and goals were discussed and mutually agreed upon: by patient  Alger Simons PT, DPT, CSRS 10/15/2022, 10:46 AM

## 2022-10-15 NOTE — Progress Notes (Signed)
PROGRESS NOTE   Subjective/Complaints: Unable to void- IC is very painful for him and he would like the foley replaced CBG up to 407- 15U novolog administered- will increase semglee to 19U   Objective:   No results found. Recent Labs    10/13/22 0428  WBC 10.8*  HGB 11.5*  HCT 35.4*  PLT 306   Recent Labs    10/13/22 0428  NA 138  K 3.6  CL 105  CO2 23  GLUCOSE 100*  BUN 17  CREATININE 1.53*  CALCIUM 8.1*    Intake/Output Summary (Last 24 hours) at 10/15/2022 1728 Last data filed at 10/15/2022 1425 Gross per 24 hour  Intake 840 ml  Output 1875 ml  Net -1035 ml        Physical Exam: Vital Signs Blood pressure (!) 122/56, pulse 100, temperature 97.6 F (36.4 C), temperature source Oral, resp. rate 16, height 5' 9"$  (1.753 m), weight 77.1 kg, SpO2 98 %. Gen: no distress, normal appearing HEENT: oral mucosa pink and moist, NCAT Cardio: Reg rate Chest: normal effort, normal rate of breathing Abd: soft, non-distended Ext: no edema Psych: pleasant, normal affect GU: Not examined. +Foley, draining clear urine.  Skin: C/D/I. No apparent lesions. MSK:      No apparent deformity.      Strength:                RUE: 5/5 SA, 5/5 EF, 5/5 EE, 5/5 WE, 5/5 FF, 5/5 FA                 LUE: 5/5 SA, 5/5 EF, 5/5 EE, 5/5 WE, 5/5 FF, 5/5 FA                 RLE: 5/5 HF, 5/5 KE, 5/5 DF, 5/5 EHL, 5/5 PF                 LLE:  3/5 HF, 4/5 KE, 5/5 DF, 5/5 EHL, 5/5 PF    Neurologic exam:  Cognition: AAO to person, place, time and event.  Language: Fluent, No substitutions or neoglisms. No dysarthria. Names 3/3 objects correctly.  Memory: Recalls 1/3 objects at 5 minutes. + significant attention and memory deficits Insight: Poor insight into current condition.  Mood: Pleasant affect, appropriate mood.  Sensation: To light touch intact in BL UEs and LEs  Reflexes: 2+ in BL UE and LEs. Negative Hoffman's and babinski signs  bilaterally.  CN: 2-12 grossly intact.  Coordination: No apparent tremors. No ataxia on FTN, HTS bilaterally.  Spasticity: MAS 0 in all extremities.     Assessment/Plan: 1. Functional deficits which require 3+ hours per day of interdisciplinary therapy in a comprehensive inpatient rehab setting. Physiatrist is providing close team supervision and 24 hour management of active medical problems listed below. Physiatrist and rehab team continue to assess barriers to discharge/monitor patient progress toward functional and medical goals  Care Tool:  Bathing    Body parts bathed by patient: Right arm, Left arm, Chest, Abdomen, Front perineal area, Buttocks, Right upper leg, Left upper leg, Right lower leg, Left lower leg, Face         Bathing assist Assist Level: Minimal Assistance -  Patient > 75%     Upper Body Dressing/Undressing Upper body dressing   What is the patient wearing?: Pull over shirt    Upper body assist Assist Level: Minimal Assistance - Patient > 75%    Lower Body Dressing/Undressing Lower body dressing      What is the patient wearing?: Pants, Underwear/pull up     Lower body assist Assist for lower body dressing: Minimal Assistance - Patient > 75%     Toileting Toileting    Toileting assist Assist for toileting: Minimal Assistance - Patient > 75%     Transfers Chair/bed transfer  Transfers assist     Chair/bed transfer assist level: Contact Guard/Touching assist     Locomotion Ambulation   Ambulation assist      Assist level: Contact Guard/Touching assist Assistive device: Walker-rolling Max distance: 200 ft   Walk 10 feet activity   Assist     Assist level: Contact Guard/Touching assist Assistive device: Walker-rolling   Walk 50 feet activity   Assist    Assist level: Contact Guard/Touching assist Assistive device: Walker-rolling    Walk 150 feet activity   Assist    Assist level: Contact Guard/Touching  assist Assistive device: Walker-rolling    Walk 10 feet on uneven surface  activity   Assist Walk 10 feet on uneven surfaces activity did not occur: Safety/medical concerns         Wheelchair     Assist Is the patient using a wheelchair?: No Type of Wheelchair: Manual Wheelchair activity did not occur: Refused         Wheelchair 50 feet with 2 turns activity    Assist    Wheelchair 50 feet with 2 turns activity did not occur: Refused       Wheelchair 150 feet activity     Assist  Wheelchair 150 feet activity did not occur: Refused       Blood pressure (!) 122/56, pulse 100, temperature 97.6 F (36.4 C), temperature source Oral, resp. rate 16, height 5' 9"$  (1.753 m), weight 77.1 kg, SpO2 98 %.    Medical Problem List and Plan: 1. Functional deficits secondary to metastatic poorly differentiated prostate cancer complicated by acute metabolic encephalopathy.  Follow-up oncology Dr.Kale as well as medical oncology Dr. Lisbeth Renshaw for plan XRT.  Continue Decadron therapy 4 mg daily for now             -patient may shower             -ELOS/Goals: 7-10 days 2.  Antithrombotics: -DVT/anticoagulation:  Mechanical: Antiembolism stockings, thigh (TED hose) Bilateral lower extremities             -antiplatelet therapy: N/A 3. Pain Management: Lidoderm patch as directed, hydrocodone/tramadol as needed 4. Mood/Behavior/Sleep: Melatonin nightly as needed             -antipsychotic agents: N/A 5. Neuropsych/cognition: This patient is capable of making decisions on his own behalf. 6. Skin/Wound Care: Routine skin checks 7. Fluids/Electrolytes/Nutrition: Routine in and outs with follow-up chemistries 8.  Chronic diastolic congestive heart failure.  Monitor for any signs of fluid overload 9.  AKI on CKD/urinary retention.  Foley tube in place for now after failed voiding trial.  Follow-up outpatient urology services.  Continue Flomax 0.4 mg daily 10.  Diabetes mellitus.   Hemoglobin A1c 8.0.  NovoLog 3 units 3 times daily, Semglee 15 units nightly.  Check blood sugars before meals and at bedtime      Recent Labs  10/14/22 0719 10/14/22 1119 10/14/22 1616  GLUCAP 207* 300* 486*   increase Semglee to 19U   11.  Hypertension.  Lopressor 25 mg twice daily.  Monitor with increased mobility. Add magnesium gluconate 276m HS 12.  Constipation.  MiraLAX daily. Add magnesium gluconate 2515mHS 13. Urinary retention. Reinserted foley 2/17  14. History of vitamin D insufficiency: start ergocalciferol 50,000U once per week for 7 weels  LOS: 1 days A FACE TO FACE EVALUATION WAS PERFORMED  KrClide Deutscheraulkar 10/15/2022, 5:28 PM

## 2022-10-15 NOTE — Evaluation (Signed)
Occupational Therapy Assessment and Plan  Patient Details  Name: Nicholas Burton MRN: [id number] Date of Birth: 1927/09/06  OT Diagnosis: abnormal posture, acute pain, cognitive deficits, and muscle weakness (generalized) Rehab Potential: Rehab Potential (ACUTE ONLY): Good ELOS: 7-10   Today's Date: 10/15/2022 OT Individual Time: [id number] OT Individual Time Calculation (min): 60 min     Hospital Problem: Principal Problem:   Prostate cancer [facility])   Past Medical History:  Past Medical History:  Diagnosis Date   Allergy    ANEMIA ASSOCIATED W/OTHER [facility] NUTRITIONAL DEFIC 08/11/2010   Qualifier: Diagnosis of  By: Ronnald Ramp MD, [name].    Cancer [facility])    skin   Cataract    Chronic combined systolic and diastolic congestive heart failure ([place]) 10/30/2018   COPD with asthma 03/02/2017   CRI (chronic renal insufficiency), stage 3 (moderate) (HCC) 10/25/2018   Deficiency anemia 10/25/2018   Diabetes mellitus    type 2   DOE (dyspnea on exertion) 09/03/2018   Edema 06/11/2009   Qualifier: Diagnosis of  By: Ronnald Ramp MD, [name].    History of skin cancer    Hyperlipidemia with target LDL less than 100 11/10/2008        Hypertension    Memory loss 05/14/2009   Obesity (BMI 30.0-34.9) 04/01/2013   Osteoarthritis    PSA elevation 10/25/2018   SKIN CANCER, HX OF 11/10/2008   Qualifier: Diagnosis of  By: Ronnald Ramp MD, [name].    Snoring 04/03/2014   Type II diabetes mellitus with manifestations ([place]) 11/10/2008   Estimated Creatinine Clearance: 38 mL/min (A) (by C-G formula based on SCr of 1.52 mg/dL (H)).    Venous stasis dermatitis of both lower extremities 01/29/2018   Vitamin D deficiency 11/10/2008   Past Surgical History:  Past Surgical History:  Procedure Laterality Date   CHOLECYSTECTOMY     INGUINAL HERNIA REPAIR     x 3   IR FLUORO GUIDED NEEDLE PLC ASPIRATION/INJECTION LOC  10/07/2022   JOINT REPLACEMENT     KNEE SURGERY     x 2   MELANOMA EXCISION     x 3 -- Left arm   TOTAL KNEE  ARTHROPLASTY     x 3    Assessment & Plan Clinical Impression: 89 y.o. male with medical history significant for dementia, type 2 diabetes mellitus, chronic diastolic heart failure, essential hypertension, hyperlipidemia, admitted 2/7 with acute encephalopathy. MRI evidence of discitis osteomyelitis and a component of epidural abscess.  Pt is s/p IR aspiration/biopsy 2/9.  Precautions: fall, urinary retension, liklly getting foley back   Patient currently requires min with basic self-care skills secondary to muscle weakness, decreased cardiorespiratoy endurance, decreased awareness, decreased problem solving, decreased safety awareness, and decreased memory, and decreased standing balance, decreased postural control, and decreased balance strategies.  Prior to hospitalization, patient could complete BADL/IADL with modified independent .  Patient will benefit from skilled intervention to decrease level of assist with basic self-care skills and increase independence with basic self-care skills prior to discharge home with care partner.  Anticipate patient will require 24 hour supervision and follow up home health.  OT - End of Session Activity Tolerance: Tolerates 30+ min activity with multiple rests Endurance Deficit: Yes OT Assessment Rehab Potential (ACUTE ONLY): Good OT Barriers to Discharge: Neurogenic Bowel & Bladder OT Patient demonstrates impairments in the following area(s): Balance;Cognition;Edema;Endurance;Motor;Pain;Perception;Safety;Sensory;Vision OT Basic ADL's Functional Problem(s): Grooming;Bathing;Dressing;Toileting OT Transfers Functional Problem(s): Toilet;Tub/Shower OT Plan OT Intensity: Minimum of 1-2 x/day, 45 to 90 minutes OT  Frequency: 5 out of 7 days OT Duration/Estimated Length of Stay: 7-10 OT Treatment/Interventions: Balance/vestibular training;DME/adaptive equipment instruction;Patient/family education;Therapeutic Activities;Wheelchair  propulsion/positioning;Therapeutic Exercise;Psychosocial support;Cognitive remediation/compensation;Functional electrical stimulation;Community reintegration;Functional mobility training;Self Care/advanced ADL retraining;UE/LE Strength taining/ROM;UE/LE Coordination activities;Skin care/wound managment;Neuromuscular re-education;Discharge planning;Disease mangement/prevention;Pain management;Splinting/orthotics;Visual/perceptual remediation/compensation OT Self Feeding Anticipated Outcome(s): S OT Basic Self-Care Anticipated Outcome(s): S OT Toileting Anticipated Outcome(s): S OT Bathroom Transfers Anticipated Outcome(s): S OT Recommendation Patient destination: Home Follow Up Recommendations: Home health OT Equipment Recommended: To be determined   OT Evaluation Precautions/Restrictions  Precautions Precautions: Fall Precaution Comments: no brace yet, scheduled to begin radiation tx on Monday 2/19 Restrictions Weight Bearing Restrictions: No General Chart Reviewed: Yes Family/Caregiver Present: Yes Vital Signs  Pain Pain Assessment Pain Scale: 0-10 Pain Score: 5  Pain Type: Chronic pain;Acute pain Pain Location: Back Pain Orientation: Left;Lower Pain Descriptors / Indicators: Aching;Discomfort Pain Onset: On-going Patients Stated Pain Goal: 0 Multiple Pain Sites: No Home Living/Prior Functioning Home Living Family/patient expects to be discharged to:: Private residence Living Arrangements: Spouse/significant other Available Help at Discharge: Family, Available 24 hours/day Type of Home: House Home Access: Stairs to enter Technical brewer of Steps: 3 Entrance Stairs-Rails: Can reach both Home Layout: One level, Laundry or work area in basement, Able to live on main level with bedroom/bathroom Bathroom Shower/Tub: Public librarian, Multimedia programmer: Handicapped height Bathroom Accessibility: Yes  Lives With: Spouse, Family Prior Function Level of  Independence: Independent with basic ADLs, Independent with transfers, Requires assistive device for independence, Independent with gait (Pt relates he was IND with ADLs)  Able to Take Stairs?: Yes Driving: No Vocation: Retired Surveyor, mining Baseline Vision/History: 6 Macular Degeneration;1 Wears glasses Ability to See in Adequate Light: 2 Moderately impaired Patient Visual Report: No change from baseline Vision Assessment?: No apparent visual deficits Perception  Perception: Within Functional Limits Praxis Praxis: Intact Cognition Cognition Overall Cognitive Status: History of cognitive impairments - at baseline Arousal/Alertness: Awake/alert Memory: Impaired Awareness: Appears intact Safety/Judgment: Impaired Brief Interview for Mental Status (BIMS) Repetition of Three Words (First Attempt): 3 Temporal Orientation: Year: Correct Temporal Orientation: Month: Accurate within 5 days Temporal Orientation: Day: Correct Recall: "Sock": No, could not recall Recall: "Blue": Yes, after cueing ("a color") Recall: "Bed": Yes, no cue required BIMS Summary Score: 12 Sensation Sensation Light Touch: Appears Intact Coordination Gross Motor Movements are Fluid and Coordinated: No Fine Motor Movements are Fluid and Coordinated: No Coordination and Movement Description: LBP prevents smooth movement patterns Heel Shin Test: WFL within pain tolerance Motor  Motor Motor: Abnormal postural alignment and control Motor - Skilled Clinical Observations: age-related and pain-related decrease in initiation and speed of movement  Trunk/Postural Assessment  Cervical Assessment Cervical Assessment: Exceptions to [facility] (forward head) Thoracic Assessment Thoracic Assessment: Exceptions to [facility] (kyphotic) Lumbar Assessment Lumbar Assessment: Exceptions to [facility] (decreased lordosis with neutral to posterior pelvic tilt most likely d/t pain) Postural Control Postural Control: Within Functional Limits   Balance Standardized Balance Assessment Standardized Balance Assessment: Berg Balance Test Berg Balance Test Sit to Stand: Able to stand  independently using hands Sitting with Back Unsupported but Feet Supported on Floor or Stool: Able to sit 2 minutes under supervision (LBP) Stand to Sit: Controls descent by using hands Transfers: Able to transfer with verbal cueing and /or supervision From Standing, Reach Forward with Outstretched Arm: Can reach forward >5 cm safely (2") From Standing Position, Pick up Object from Floor: Able to pick up shoe, needs supervision Extremity/Trunk Assessment RUE Assessment RUE Assessment: Within Functional Limits LUE Assessment LUE  Assessment: Within Functional Limits  Care Tool Care Tool Self Care Eating   Eating Assist Level: Supervision/Verbal cueing    Oral Care    Oral Care Assist Level: Minimal Assistance - Patient > 75%    Bathing   Body parts bathed by patient: Right arm;Left arm;Chest;Abdomen;Front perineal area;Buttocks;Right upper leg;Left upper leg;Right lower leg;Left lower leg;Face     Assist Level: Minimal Assistance - Patient > 75%    Upper Body Dressing(including orthotics)   What is the patient wearing?: Pull over shirt   Assist Level: Minimal Assistance - Patient > 75%    Lower Body Dressing (excluding footwear)   What is the patient wearing?: Pants;Underwear/pull up Assist for lower body dressing: Minimal Assistance - Patient > 75%    Putting on/Taking off footwear   What is the patient wearing?: Shoes;Socks Assist for footwear: Contact Guard/Touching assist       Care Tool Toileting Toileting activity   Assist for toileting: Minimal Assistance - Patient > 75%     Care Tool Bed Mobility Roll left and right activity   Roll left and right assist level: Supervision/Verbal cueing    Sit to lying activity   Sit to lying assist level: Supervision/Verbal cueing    Lying to sitting on side of bed activity   Lying  to sitting on side of bed assist level: the ability to move from lying on the back to sitting on the side of the bed with no back support.: Supervision/Verbal cueing     Care Tool Transfers Sit to stand transfer   Sit to stand assist level: Contact Guard/Touching assist    Chair/bed transfer   Chair/bed transfer assist level: Contact Guard/Touching assist     Toilet transfer   Assist Level: Contact Guard/Touching assist     Care Tool Cognition  Expression of Ideas and Wants    Understanding Verbal and Non-Verbal Content     Memory/Recall Ability     Refer to Care Plan for Long Term Goals  [place] TERM GOAL WEEK 1 OT [place] Term Goal 1 (Week 1): STG=LTG d/t ELOS  Recommendations for other services: Therapeutic Recreation  Pet therapy   Skilled Therapeutic Intervention ADL ADL Eating: Supervision/safety Grooming: Supervision/safety Upper Body Bathing: Supervision/safety Where Assessed-Upper Body Bathing: Shower Lower Body Bathing: Minimal assistance Where Assessed-Lower Body Bathing: Shower Upper Body Dressing: Minimal assistance Where Assessed-Upper Body Dressing: Edge of bed Lower Body Dressing: Minimal assistance Where Assessed-Lower Body Dressing: Edge of bed Toileting: Contact guard Where Assessed-Toileting: Interior and spatial designer: Environmental education officer Method: Ambulating Mobility  Bed Mobility Bed Mobility: Rolling Right;Rolling Left;Left Sidelying to Sit;Supine to Sit;Sit to Supine Rolling Right: Supervision/verbal cueing;Contact Guard/Touching assist Rolling Left: Supervision/Verbal cueing;Contact Guard/Touching assist Left Sidelying to Sit: Supervision/Verbal cueing Supine to Sit: Supervision/Verbal cueing Sit to Supine: Supervision/Verbal cueing Transfers Sit to Stand: Contact Guard/Touching assist Stand to Sit: Contact Guard/Touching assist  Pt and sons present at beginning of session. Pt agreeable to shower after edu re OT  role/purpose, CIR, ELOS and poC. Pt very HOH and needs gestures and lip reading when hearing aides out in the shower. Pt overall requires min-CGA for BADLs. Pt completes bathroom transfers with RW and MINA, bathes on TTB, and dresses from EOB as stated above. Overall pt demo decreased strength in LLE impacting BADL of LB. Exited session with pt seated in bed, exit alarm on and call light in reach    Discharge Criteria: Patient will be discharged from OT if patient refuses treatment  3 consecutive times without medical reason, if treatment goals not met, if there is a change in medical status, if patient makes no progress towards goals or if patient is discharged from hospital.  The above assessment, treatment plan, treatment alternatives and goals were discussed and mutually agreed upon: by patient  [name] 10/15/2022, 11:16 AM

## 2022-10-16 DIAGNOSIS — C61 Malignant neoplasm of prostate: Secondary | ICD-10-CM | POA: Diagnosis not present

## 2022-10-16 LAB — GLUCOSE, CAPILLARY
Glucose-Capillary: 90 mg/dL (ref 70–99)
Glucose-Capillary: 187 mg/dL — ABNORMAL HIGH (ref 70–99)
Glucose-Capillary: 305 mg/dL — ABNORMAL HIGH (ref 70–99)
Glucose-Capillary: 264 mg/dL — ABNORMAL HIGH (ref 70–99)

## 2022-10-16 MED ORDER — INSULIN GLARGINE-YFGN 100 UNIT/ML ~~LOC~~ SOLN
20.0000 [IU] | Freq: Every day | SUBCUTANEOUS | Status: DC
  Administered 2022-10-16 – 2022-10-17 (×2): 20 [IU] via SUBCUTANEOUS
  Filled 2022-10-16 (×3): qty 0.2

## 2022-10-16 NOTE — Progress Notes (Signed)
PROGRESS NOTE   Subjective/Complaints: +low back pain- improved after receiving hydrocodone, kpad ordered Son says he received long acting 20 inulin at home, no other forms of insulin  ROS: +low back pain   Objective:   No results found. No results for input(s): "WBC", "HGB", "HCT", "PLT" in the last 72 hours.  No results for input(s): "NA", "K", "CL", "CO2", "GLUCOSE", "BUN", "CREATININE", "CALCIUM" in the last 72 hours.   Intake/Output Summary (Last 24 hours) at 10/16/2022 1059 Last data filed at 10/16/2022 0835 Gross per 24 hour  Intake 780 ml  Output 2050 ml  Net -1270 ml        Physical Exam: Vital Signs Blood pressure (!) 142/69, pulse 77, temperature 98 F (36.7 C), temperature source Oral, resp. rate 16, height 5' 9"$  (1.753 m), weight 77.1 kg, SpO2 97 %. Gen: no distress, normal appearing, BMI 25.1 HEENT: oral mucosa pink and moist, NCAT Cardio: Reg rate Chest: normal effort, normal rate of breathing Abd: soft, non-distended Ext: no edema Psych: pleasant, normal affect GU: Not examined. +Foley, draining clear urine.  Skin: C/D/I. No apparent lesions. MSK:      No apparent deformity.      Strength:                RUE: 5/5 SA, 5/5 EF, 5/5 EE, 5/5 WE, 5/5 FF, 5/5 FA                 LUE: 5/5 SA, 5/5 EF, 5/5 EE, 5/5 WE, 5/5 FF, 5/5 FA                 RLE: 5/5 HF, 5/5 KE, 5/5 DF, 5/5 EHL, 5/5 PF                 LLE:  3/5 HF, 4/5 KE, 5/5 DF, 5/5 EHL, 5/5 PF    Neurologic exam:  Cognition: AAO to person, place, time and event.  Language: Fluent, No substitutions or neoglisms. No dysarthria. Names 3/3 objects correctly.  Memory: Recalls 1/3 objects at 5 minutes. + significant attention and memory deficits Insight: Poor insight into current condition.  Mood: Pleasant affect, appropriate mood.  Sensation: To light touch intact in BL UEs and LEs  Reflexes: 2+ in BL UE and LEs. Negative Hoffman's and babinski  signs bilaterally.  CN: 2-12 grossly intact.  Coordination: No apparent tremors. No ataxia on FTN, HTS bilaterally.  Spasticity: MAS 0 in all extremities.     Assessment/Plan: 1. Functional deficits which require 3+ hours per day of interdisciplinary therapy in a comprehensive inpatient rehab setting. Physiatrist is providing close team supervision and 24 hour management of active medical problems listed below. Physiatrist and rehab team continue to assess barriers to discharge/monitor patient progress toward functional and medical goals  Care Tool:  Bathing    Body parts bathed by patient: Right arm, Left arm, Chest, Abdomen, Front perineal area, Buttocks, Right upper leg, Left upper leg, Right lower leg, Left lower leg, Face         Bathing assist Assist Level: Minimal Assistance - Patient > 75%     Upper Body Dressing/Undressing Upper body dressing   What is the  patient wearing?: Pull over shirt    Upper body assist Assist Level: Minimal Assistance - Patient > 75%    Lower Body Dressing/Undressing Lower body dressing      What is the patient wearing?: Pants, Underwear/pull up     Lower body assist Assist for lower body dressing: Minimal Assistance - Patient > 75%     Toileting Toileting    Toileting assist Assist for toileting: Minimal Assistance - Patient > 75%     Transfers Chair/bed transfer  Transfers assist     Chair/bed transfer assist level: Contact Guard/Touching assist     Locomotion Ambulation   Ambulation assist      Assist level: Contact Guard/Touching assist Assistive device: Walker-rolling Max distance: 200 ft   Walk 10 feet activity   Assist     Assist level: Contact Guard/Touching assist Assistive device: Walker-rolling   Walk 50 feet activity   Assist    Assist level: Contact Guard/Touching assist Assistive device: Walker-rolling    Walk 150 feet activity   Assist    Assist level: Contact Guard/Touching  assist Assistive device: Walker-rolling    Walk 10 feet on uneven surface  activity   Assist Walk 10 feet on uneven surfaces activity did not occur: Safety/medical concerns         Wheelchair     Assist Is the patient using a wheelchair?: No Type of Wheelchair: Manual Wheelchair activity did not occur: Refused         Wheelchair 50 feet with 2 turns activity    Assist    Wheelchair 50 feet with 2 turns activity did not occur: Refused       Wheelchair 150 feet activity     Assist  Wheelchair 150 feet activity did not occur: Refused       Blood pressure (!) 142/69, pulse 77, temperature 98 F (36.7 C), temperature source Oral, resp. rate 16, height 5' 9"$  (1.753 m), weight 77.1 kg, SpO2 97 %.    Medical Problem List and Plan: 1. Functional deficits secondary to metastatic poorly differentiated prostate cancer complicated by acute metabolic encephalopathy.  Follow-up oncology Dr.Kale as well as medical oncology Dr. Lisbeth Renshaw for plan XRT.  Continue Decadron therapy 4 mg daily for now             -patient may shower             -ELOS/Goals: 7-10 days  Continue CIR 2.  Antithrombotics: -DVT/anticoagulation:  Mechanical: Antiembolism stockings, thigh (TED hose) Bilateral lower extremities             -antiplatelet therapy: N/A 3.Low back pain: kpad ordered. Lidoderm patch as directed, hydrocodone/tramadol as needed 4. Mood/Behavior/Sleep: Melatonin nightly as needed             -antipsychotic agents: N/A 5. Neuropsych/cognition: This patient is capable of making decisions on his own behalf. 6. Skin/Wound Care: Routine skin checks 7. Fluids/Electrolytes/Nutrition: Routine in and outs with follow-up chemistries 8.  Chronic diastolic congestive heart failure.  Monitor for any signs of fluid overload 9.  AKI on CKD/urinary retention.  Foley tube in place for now after failed voiding trial.  Follow-up outpatient urology services.  Continue Flomax 0.4 mg daily 10.   Diabetes mellitus.  Hemoglobin A1c 8.0.  NovoLog 3 units 3 times daily, Semglee 15 units nightly.  Check blood sugars before meals and at bedtime      Recent Labs    10/14/22 0719 10/14/22 1119 10/14/22 1616  GLUCAP 207* 300* 486*  Increase semglee back to home dose to 20U   11.  Hypertension.  Lopressor 25 mg twice daily.  Monitor with increased mobility. Add magnesium gluconate 263m HS 12.  Constipation.  MiraLAX daily. Add magnesium gluconate 2557mHS 13. Urinary retention. Reinserted foley 2/17, maintain until outpatient uro follow-up for comfort.  14. History of vitamin D insufficiency: start ergocalciferol 50,000U once per week for 7 weels  LOS: 2 days A FACE TO FACE EVALUATION WAS PERFORMED  KrMartha Clan Jalon Squier 10/16/2022, 10:59 AM

## 2022-10-17 ENCOUNTER — Other Ambulatory Visit: Payer: Self-pay

## 2022-10-17 ENCOUNTER — Ambulatory Visit
Admit: 2022-10-17 | Discharge: 2022-10-17 | Disposition: A | Discharge status: Home / Self Care | Payer: Medicare Other | Attending: Radiation Oncology | Admitting: Radiation Oncology

## 2022-10-17 DIAGNOSIS — C7951 Secondary malignant neoplasm of bone: Secondary | ICD-10-CM | POA: Diagnosis not present

## 2022-10-17 DIAGNOSIS — I10 Essential (primary) hypertension: Secondary | ICD-10-CM

## 2022-10-17 DIAGNOSIS — C61 Malignant neoplasm of prostate: Secondary | ICD-10-CM | POA: Diagnosis not present

## 2022-10-17 DIAGNOSIS — E1165 Type 2 diabetes mellitus with hyperglycemia: Secondary | ICD-10-CM | POA: Diagnosis not present

## 2022-10-17 DIAGNOSIS — Z510 Encounter for antineoplastic radiation therapy: Secondary | ICD-10-CM | POA: Diagnosis not present

## 2022-10-17 DIAGNOSIS — N1832 Chronic kidney disease, stage 3b: Secondary | ICD-10-CM | POA: Diagnosis not present

## 2022-10-17 DIAGNOSIS — R7989 Other specified abnormal findings of blood chemistry: Secondary | ICD-10-CM

## 2022-10-17 DIAGNOSIS — H6123 Impacted cerumen, bilateral: Secondary | ICD-10-CM | POA: Diagnosis not present

## 2022-10-17 DIAGNOSIS — K5900 Constipation, unspecified: Secondary | ICD-10-CM

## 2022-10-17 LAB — COMPREHENSIVE METABOLIC PANEL
ALT: 47 U/L — ABNORMAL HIGH (ref 0–44)
AST: 37 U/L (ref 15–41)
Albumin: 2.2 g/dL — ABNORMAL LOW (ref 3.5–5.0)
Alkaline Phosphatase: 137 U/L — ABNORMAL HIGH (ref 38–126)
Anion gap: 11 (ref 5–15)
BUN: 25 mg/dL — ABNORMAL HIGH (ref 8–23)
CO2: 24 mmol/L (ref 22–32)
Calcium: 8.3 mg/dL — ABNORMAL LOW (ref 8.9–10.3)
Chloride: 103 mmol/L (ref 98–111)
Creatinine, Ser: 1.54 mg/dL — ABNORMAL HIGH (ref 0.61–1.24)
GFR, Estimated: 42 mL/min — ABNORMAL LOW (ref >60)
Glucose, Bld: 90 mg/dL (ref 70–99)
Potassium: 3.5 mmol/L (ref 3.5–5.1)
Sodium: 138 mmol/L (ref 135–145)
Total Bilirubin: 0.5 mg/dL (ref 0.3–1.2)
Total Protein: 4.9 g/dL — ABNORMAL LOW (ref 6.5–8.1)

## 2022-10-17 LAB — RAD ONC ARIA SESSION SUMMARY
Course Elapsed Days: 0
Plan Fractions Treated to Date: 1
Plan Prescribed Dose Per Fraction: 3 Gy
Plan Total Fractions Prescribed: 10
Plan Total Prescribed Dose: 30 Gy
Reference Point Dosage Given to Date: 3 Gy
Reference Point Session Dosage Given: 3 Gy
Session Number: 1

## 2022-10-17 LAB — CBC WITH DIFFERENTIAL/PLATELET
Abs Immature Granulocytes: 0.11 10*3/uL — ABNORMAL HIGH (ref 0.00–0.07)
Basophils Absolute: 0 10*3/uL (ref 0.0–0.1)
Basophils Relative: 0 %
Eosinophils Absolute: 0.2 10*3/uL (ref 0.0–0.5)
Eosinophils Relative: 2 %
HCT: 32.9 % — ABNORMAL LOW (ref 39.0–52.0)
Hemoglobin: 10.7 g/dL — ABNORMAL LOW (ref 13.0–17.0)
Immature Granulocytes: 1 %
Lymphocytes Relative: 24 %
Lymphs Abs: 3.7 10*3/uL (ref 0.7–4.0)
MCH: 28.5 pg (ref 26.0–34.0)
MCHC: 32.5 g/dL (ref 30.0–36.0)
MCV: 87.5 fL (ref 80.0–100.0)
Monocytes Absolute: 1.9 10*3/uL — ABNORMAL HIGH (ref 0.1–1.0)
Monocytes Relative: 12 %
Neutro Abs: 9.3 10*3/uL — ABNORMAL HIGH (ref 1.7–7.7)
Neutrophils Relative %: 61 %
Platelets: 362 10*3/uL (ref 150–400)
RBC: 3.76 MIL/uL — ABNORMAL LOW (ref 4.22–5.81)
RDW: 14.6 % (ref 11.5–15.5)
WBC: 15.3 10*3/uL — ABNORMAL HIGH (ref 4.0–10.5)
nRBC: 0 % (ref 0.0–0.2)

## 2022-10-17 LAB — GLUCOSE, CAPILLARY
Glucose-Capillary: 82 mg/dL (ref 70–99)
Glucose-Capillary: 186 mg/dL — ABNORMAL HIGH (ref 70–99)
Glucose-Capillary: 253 mg/dL — ABNORMAL HIGH (ref 70–99)
Glucose-Capillary: 223 mg/dL — ABNORMAL HIGH (ref 70–99)

## 2022-10-17 MED ORDER — CARBAMIDE PEROXIDE 6.5 % OT SOLN
5.0000 [drp] | Freq: Two times a day (BID) | OTIC | Status: DC
  Administered 2022-10-17 – 2022-10-18 (×4): 5 [drp] via OTIC
  Filled 2022-10-17: qty 15

## 2022-10-17 MED ORDER — CHLORHEXIDINE GLUCONATE CLOTH 2 % EX PADS
6.0000 | MEDICATED_PAD | Freq: Every day | CUTANEOUS | Status: DC
  Administered 2022-10-17: 6 via TOPICAL

## 2022-10-17 NOTE — Progress Notes (Signed)
Inpatient Rehabilitation  Patient information reviewed and entered into eRehab system by Eulon Allnutt Zian Delair, OTR/L, Rehab Quality Coordinator.   Information including medical coding, functional ability and quality indicators will be reviewed and updated through discharge.   

## 2022-10-17 NOTE — Progress Notes (Signed)
Inpatient Rehabilitation Center Individual Statement of Services  Patient Name:  Nicholas Burton  Date:  10/17/2022  Welcome to the [facility].  Our goal is to provide you with an individualized program based on your diagnosis and situation, designed to meet your specific needs.  With this comprehensive rehabilitation program, you will be expected to participate in at least 3 hours of rehabilitation therapies Monday-Friday, with modified therapy programming on the weekends.  Your rehabilitation program will include the following services:  Physical Therapy (PT), Occupational Therapy (OT), Speech Therapy (ST), 24 hour per day rehabilitation nursing, Therapeutic Recreaction (TR), Neuropsychology, Care Coordinator, Rehabilitation Medicine, Nutrition Services, Pharmacy Services, and Other  Weekly team conferences will be held on Wednesdays to discuss your progress.  Your Inpatient Rehabilitation Care Coordinator will talk with you frequently to get your input and to update you on team discussions.  Team conferences with you and your family in attendance may also be held.  Expected length of stay: 7-10 Days  Overall anticipated outcome:  Supervision  Depending on your progress and recovery, your program may change. Your Inpatient Rehabilitation Care Coordinator will coordinate services and will keep you informed of any changes. Your Inpatient Rehabilitation Care Coordinator's name and contact numbers are listed  below.  The following services may also be recommended but are not provided by the [place]:   [place] will be made to provide these services after discharge if needed.  Arrangements include referral to agencies that provide these services.  Your insurance has been verified to be:  Medicare A & B Your primary doctor is:  Scarlette Calico, MD  Pertinent information will be  shared with your doctor and your insurance company.  Inpatient Rehabilitation Care Coordinator:  Erlene Quan, [place] or [phone]  Information discussed with and copy given to patient by: Dyanne Iha, 10/17/2022, 11:33 AM

## 2022-10-17 NOTE — Progress Notes (Signed)
Physical Therapy Session Note  Patient Details  Name: Nicholas Burton MRN: [id number] Date of Birth: 1927/10/23  Today's Date: 10/17/2022 PT Individual Time: 0931-1040 PT Individual Time Calculation (min): 69 min   [place] Term Goals: Week 1:  PT [place] Term Goal 1 (Week 1): STG=LTG d/t ELOS  Skilled Therapeutic Interventions/Progress Updates:   Received pt semi-reclined in bed with son present at bedside. Pt agreeable to PT treatment and reported pain in legs and low back (unrated) - repositioning and rest breaks done to reduce pain levels. Session with emphasis on functional mobility/transfers, generalized strengthening and endurance, dynamic standing balance/coordination, and gait training. Pt performed bed mobility with supervision using bed features x 2 trials throughout session and performed all transfers with RW and CGA/close supervision.   Pt transported to/from room in [facility] dependently for time management purposes. Stood from [facility] with RW and CGA and ambulated 166f with RW and CGA. Introduced pt to rollator and educated pt on rAcupuncturistand importance of backing rollator against wall prior to sitting. Pt required mod cues for rollator safety, and reported rollator "moved to fast", ultimately requesting to use RW. Pt then performed the following standing exercises with emphasis on LE strength/ROM: -mini squats 2x8 -marches 2x20 with 0.25lb ankle weight -heel raises 2x10 Transitioned to alternating toe taps to 6in step with BUE support on RW and CGA for balance. Pt then ambulated additional 1876fwith RW and CGA fading to close supervision. Upon returning to mat, pt stated "I'm about worn out" and reported increased pain in legs - therefore transitioned to seated upper body exercises. Pt performed the following exercises with emphasis on UE strength/ROM: -bicep curls with 5lb dowel 2x20 -shoulder press at 90 degrees with 5lb dowel 2x10 -overhead shoulder press with  5b dowel 2x10 Pt ambulated additional [id number] RW and supervision back to room and requested to return to bed. Concluded session with pt semi-reclined in bed, needs within reach, and bed alarm on.   Therapy Documentation Precautions:  Precautions Precautions: Fall Precaution Comments: no brace yet, scheduled to begin radiation tx on Monday 2/19 Restrictions Weight Bearing Restrictions: No  Therapy/Group: Individual Therapy AnnAlfonse Alpers, DPT  10/17/2022, 7:07 AM

## 2022-10-17 NOTE — Progress Notes (Signed)
Inpatient Rehabilitation Care Coordinator Assessment and Plan Patient Details  Name: Nicholas Burton MRN: [id number] Date of Birth: [birth year]  Today's Date: 10/17/2022  Hospital Problems: Principal Problem:   Prostate cancer [facility])  Past Medical History:  Past Medical History:  Diagnosis Date   Allergy    ANEMIA ASSOCIATED W/OTHER [facility] NUTRITIONAL DEFIC 08/11/2010   Qualifier: Diagnosis of  By: Ronnald Ramp MD, [name].    Cancer [facility])    skin   Cataract    Chronic combined systolic and diastolic congestive heart failure ([place]) 10/30/2018   COPD with asthma 03/02/2017   CRI (chronic renal insufficiency), stage 3 (moderate) (HCC) 10/25/2018   Deficiency anemia 10/25/2018   Diabetes mellitus    type 2   DOE (dyspnea on exertion) 09/03/2018   Edema 06/11/2009   Qualifier: Diagnosis of  By: Ronnald Ramp MD, [name].    History of skin cancer    Hyperlipidemia with target LDL less than 100 11/10/2008        Hypertension    Memory loss 05/14/2009   Obesity (BMI 30.0-34.9) 04/01/2013   Osteoarthritis    PSA elevation 10/25/2018   SKIN CANCER, HX OF 11/10/2008   Qualifier: Diagnosis of  By: Ronnald Ramp MD, [name].    Snoring 04/03/2014   Type II diabetes mellitus with manifestations ([place]) 11/10/2008   Estimated Creatinine Clearance: 38 mL/min (A) (by C-G formula based on SCr of 1.52 mg/dL (H)).    Venous stasis dermatitis of both lower extremities 01/29/2018   Vitamin D deficiency 11/10/2008   Past Surgical History:  Past Surgical History:  Procedure Laterality Date   CHOLECYSTECTOMY     INGUINAL HERNIA REPAIR     x 3   IR FLUORO GUIDED NEEDLE PLC ASPIRATION/INJECTION LOC  10/07/2022   JOINT REPLACEMENT     KNEE SURGERY     x 2   MELANOMA EXCISION     x 3 -- Left arm   TOTAL KNEE ARTHROPLASTY     x 3   Social History:  reports that he has never smoked. He has never used smokeless tobacco. He reports that he does not drink alcohol and does not use drugs.  Family / Support Systems Patient Roles:  Spouse Spouse/Significant Other: Nicholas Burton Children: Nicholas Burton (son), Nicholas Burton (son), 4 sons total Other Supports: Nicholas Burton abd Nicholas Burton Anticipated Caregiver: Son primary contact. Spouse and granddaughter, Nicholas Burton live in home and assisting 24/7. Other family member plan to assist (Spouse recently admittted for COPD) Ability/Limitations of Caregiver: N/A Caregiver Availability: 24/7 Family Dynamics: Supportive family. Children, grandchildren and other family will assist. Granddaughter lives in home with patient and son. Son is primary contact  Social History Preferred language: English Religion: [employer] Cultural Background: Patient previously independent. Has been requiring additional assistance since  dx. Education: HS Health Literacy - How often do you need to have someone help you when you read instructions, pamphlets, or other written material from your doctor or pharmacy?: Often Writes: Yes Legal History/Current Legal Issues: N/A Guardian/Conservator: Nicholas Burton   Abuse/Neglect Abuse/Neglect Assessment Can Be Completed: Yes Physical Abuse: Denies Verbal Abuse: Denies Sexual Abuse: Denies Exploitation of patient/patient's resources: Denies Self-Neglect: Denies  Patient response to: Social Isolation - How often do you feel lonely or isolated from those around you?: Never  Emotional Status Recent Psychosocial Issues: Coping Psychiatric History: N/A Substance Abuse History: N/a  Patient / Family Perceptions, Expectations & Goals Pt/Family understanding of illness & functional limitations: yes Premorbid pt/family roles/activities: Patient required some  a Anticipated changes in roles/activities/participation: Patient will have 24/7 support at home from spouse, children and grandchildren Pt/family expectations/goals: Supervision  [place] Agencies: None Premorbid Home Care/DME Agencies: Other (Comment) ([facility], [facility]) Transportation  available at discharge: Family able to transport Is the patient able to respond to transportation needs?: Yes In the past 12 months, has lack of transportation kept you from medical appointments or from getting medications?: No In the past 12 months, has lack of transportation kept you from meetings, work, or from getting things needed for daily living?: No Resource referrals recommended: Neuropsychology  Discharge Planning Living Arrangements: Spouse/significant other, Children, Other relatives Support Systems: Spouse/significant other, Children, Other relatives Type of Residence: Private residence (1 level home, 3 steps to enter) Insurance Resources: Chartered certified accountant Resources: Family Support, Social Security Financial Screen Referred: No Living Expenses: Own Money Management: Patient, Spouse Does the patient have any problems obtaining your medications?: No Home Management: Receives assistance from spouse and granddaughter Patient/Family Preliminary Plans: Family plans to continue to manage Care Coordinator Anticipated Follow Up Needs: HH/OP Expected length of stay: 7-10 Days  Clinical Impression SW briefly met with patient and son in the hallway before transfer to radiation. Sw introduced self and explained role SW will follow up with patient and son on Wednesday. No additional questions or concerns.   Dyanne Iha 10/17/2022, 12:22 PM

## 2022-10-17 NOTE — Progress Notes (Signed)
Patient ID: Nicholas Burton, male   DOB: 1928-04-14, 89 y.o.   MRN: [id number]  2/19: radiation at 12:40 pickup at 12 2/20: radiation at 2:35 pickup at 2 2/21: radiation at 3:45 pickup at 3 2/22: radiation at 12:15 pickup at 1145  2/23: radiation at 2:15 pickup at 1:45

## 2022-10-17 NOTE — Progress Notes (Addendum)
PROGRESS NOTE   Subjective/Complaints: Continues to have lower back pain although he feels like this is under control.  He knows he has as needed pain medications available if he needs this.  Later family reports they frequently clean his ears out due to increased earwax buildup.  He does not like having a Foley in place.  Discussed plans for continuing this until outpatient follow-up with urology.  ROS: +low back pain, denies chest pain or shortness of breath   Objective:   No results found. Recent Labs    10/17/22 0611  WBC 15.3*  HGB 10.7*  HCT 32.9*  PLT 362    Recent Labs    10/17/22 0611  NA 138  K 3.5  CL 103  CO2 24  GLUCOSE 90  BUN 25*  CREATININE 1.54*  CALCIUM 8.3*     Intake/Output Summary (Last 24 hours) at 10/17/2022 [id number] Last data filed at 10/17/2022 0830 Gross per 24 hour  Intake 1780 ml  Output 1300 ml  Net 480 ml         Physical Exam: Vital Signs Blood pressure (!) 146/76, pulse 87, temperature 97.9 F (36.6 C), temperature source Oral, resp. rate 17, height 5' 9"$  (1.753 m), weight 77.1 kg, SpO2 97 %. Gen: no distress, normal appearing, BMI 25.1 HEENT: oral mucosa pink and moist, NCAT, hard of hearing Cardio: Reg rate Chest: normal effort, normal rate of breathing Abd: soft, non-distended, positive bowel sounds Ext: no edema Psych: pleasant, normal affect GU: Not examined. +Foley, draining clear urine.  Skin: C/D/I. No apparent lesions. MSK:      No apparent deformity.      Strength:                RUE: 5/5 SA, 5/5 EF, 5/5 EE, 5/5 WE, 5/5 FF, 5/5 FA                 LUE: 5/5 SA, 5/5 EF, 5/5 EE, 5/5 WE, 5/5 FF, 5/5 FA                 RLE: 5/5 HF, 5/5 KE, 5/5 DF, 5/5 EHL, 5/5 PF                 LLE:  3/5 HF, 4/5 KE, 5/5 DF, 5/5 EHL, 5/5 PF    Neurologic exam:  Cognition: AAO to person, place, time and event.  Language: Fluent, No substitutions or neoglisms. No dysarthria. Names  3/3 objects correctly.  Memory: Recalls 1/3 objects at 5 minutes. + significant attention and memory deficits Insight: Poor insight into current condition.  Mood: Pleasant affect, appropriate mood.  Sensation: To light touch intact in BL UEs and LEs  Reflexes: 2+ in BL UE and LEs. Negative Hoffman's and babinski signs bilaterally.  CN: 2-12 grossly intact.  Coordination: No apparent tremors. No ataxia on FTN, HTS bilaterally.  Spasticity: MAS 0 in all extremities.     Assessment/Plan: 1. Functional deficits which require 3+ hours per day of interdisciplinary therapy in a comprehensive inpatient rehab setting. Physiatrist is providing close team supervision and 24 hour management of active medical problems listed below. Physiatrist and rehab team continue to assess barriers  to discharge/monitor patient progress toward functional and medical goals  Care Tool:  Bathing    Body parts bathed by patient: Right arm, Left arm, Chest, Abdomen, Front perineal area, Buttocks, Right upper leg, Left upper leg, Right lower leg, Left lower leg, Face         Bathing assist Assist Level: Minimal Assistance - Patient > 75%     Upper Body Dressing/Undressing Upper body dressing   What is the patient wearing?: Pull over shirt    Upper body assist Assist Level: Minimal Assistance - Patient > 75%    Lower Body Dressing/Undressing Lower body dressing      What is the patient wearing?: Pants, Underwear/pull up     Lower body assist Assist for lower body dressing: Minimal Assistance - Patient > 75%     Toileting Toileting    Toileting assist Assist for toileting: Minimal Assistance - Patient > 75%     Transfers Chair/bed transfer  Transfers assist     Chair/bed transfer assist level: Contact Guard/Touching assist     Locomotion Ambulation   Ambulation assist      Assist level: Contact Guard/Touching assist Assistive device: Walker-rolling Max distance: 200 ft   Walk 10  feet activity   Assist     Assist level: Contact Guard/Touching assist Assistive device: Walker-rolling   Walk 50 feet activity   Assist    Assist level: Contact Guard/Touching assist Assistive device: Walker-rolling    Walk 150 feet activity   Assist    Assist level: Contact Guard/Touching assist Assistive device: Walker-rolling    Walk 10 feet on uneven surface  activity   Assist Walk 10 feet on uneven surfaces activity did not occur: Safety/medical concerns         Wheelchair     Assist Is the patient using a wheelchair?: No Type of Wheelchair: Manual Wheelchair activity did not occur: Refused         Wheelchair 50 feet with 2 turns activity    Assist    Wheelchair 50 feet with 2 turns activity did not occur: Refused       Wheelchair 150 feet activity     Assist  Wheelchair 150 feet activity did not occur: Refused       Blood pressure (!) 146/76, pulse 87, temperature 97.9 F (36.6 C), temperature source Oral, resp. rate 17, height 5' 9"$  (1.753 m), weight 77.1 kg, SpO2 97 %.    Medical Problem List and Plan: 1. Functional deficits secondary to metastatic poorly differentiated prostate cancer complicated by acute metabolic encephalopathy.  Follow-up oncology Dr.Kale as well as medical oncology Dr. Lisbeth Renshaw for plan XRT.  Continue Decadron therapy 4 mg daily for now             -patient may shower             -ELOS/Goals: 7-10 days  Continue CIR 2.  Antithrombotics: -DVT/anticoagulation:  Mechanical: Antiembolism stockings, thigh (TED hose) Bilateral lower extremities             -antiplatelet therapy: N/A 3.Low back pain: kpad ordered. Lidoderm patch as directed, hydrocodone/tramadol as needed 4. Mood/Behavior/Sleep: Melatonin nightly as needed             -antipsychotic agents: N/A 5. Neuropsych/cognition: This patient is capable of making decisions on his own behalf. 6. Skin/Wound Care: Routine skin checks 7.  Fluids/Electrolytes/Nutrition: Routine in and outs with follow-up chemistries 8.  Chronic diastolic congestive heart failure.  Monitor for any signs of fluid  overload 9.  AKI on CKD/urinary retention.  Foley tube in place for now after failed voiding trial.  Follow-up outpatient urology services.  Continue Flomax 0.4 mg daily  -2/19 creatinine appears stable at 1.54, BUN 25.  Appears around 3b, Continue to monitor 10.  Diabetes mellitus.  Hemoglobin A1c 8.0.  NovoLog 3 units 3 times daily, Semglee 15 units nightly.  Check blood sugars before meals and at bedtime Increase semglee back to home dose to 20U  CBG (last 3)  Recent Labs    10/16/22 2101 10/17/22 0607 10/17/22 1117  GLUCAP 264* 82 186*  2/19 CBGs appear improved, continue to monitor trend    11.  Hypertension.  Lopressor 25 mg twice daily.  Monitor with increased mobility. Add magnesium gluconate 276m HS  2/19 well-controlled 12.  Constipation.  MiraLAX daily. Add magnesium gluconate 2566mHS  -2/19 improved, last BM yesterday 13. Urinary retention. Reinserted foley 2/17, maintain until outpatient uro follow-up for comfort.  14. History of vitamin D insufficiency: start ergocalciferol 50,000U once per week for 7 weels 15.  Cerumen buildup -Debrox ordered 16.  Elevated liver enzymes, mildly elevated ALT and ALK phosphatase  -Avoid hepatotoxic medications, he does not appear to be using Tylenol very frequently  LOS: 3 days A FACE TO FACE EVALUATION WAS PERFORMED  YuJennye Boroughs/19/2024, 8:32 AM

## 2022-10-17 NOTE — IPOC Note (Signed)
Overall Plan of Care [facility]) Patient Details Name: Nicholas Burton MRN: [id number] DOB: 07-10-1928  Admitting Diagnosis: Prostate cancer [facility])  Hospital Problems: Principal Problem:   Prostate cancer [facility])     Functional Problem List: Nursing Bladder, Safety, Bowel, Endurance, Medication Management, Pain  PT Balance, Endurance, Motor, Pain, Safety, Sensory  OT Balance, Cognition, Edema, Endurance, Motor, Pain, Perception, Safety, Sensory, Vision  SLP    TR         Basic ADL's: OT Grooming, Bathing, Dressing, Toileting     Advanced  ADL's: OT       Transfers: PT Bed Mobility, Bed to Chair, Car, Manufacturing systems engineer, Metallurgist: PT Ambulation, Emergency planning/management officer, Stairs     Additional Impairments: OT    SLP        TR      Anticipated Outcomes Item Anticipated Outcome  Self Feeding S  Swallowing      Basic self-care  S  Toileting  S   Bathroom Transfers S  Bowel/Bladder  manage bowel w mod I and bladder w min assist  Transfers  supervision  Locomotion  supervision  Communication     Cognition     Pain  < 4 with prns  Safety/Judgment  manage w cues   Therapy Plan: PT Intensity: Minimum of 1-2 x/day ,45 to 90 minutes PT Frequency: 5 out of 7 days PT Duration Estimated Length of Stay: 7-10 days OT Intensity: Minimum of 1-2 x/day, 45 to 90 minutes OT Frequency: 5 out of 7 days OT Duration/Estimated Length of Stay: 7-10     Team Interventions: Nursing Interventions Patient/Family Education, Pain Management, Bladder Management, Medication Management, Discharge Planning, Bowel Management, Disease Management/Prevention  PT interventions Ambulation/gait training, Community reintegration, DME/adaptive equipment instruction, Neuromuscular re-education, Psychosocial support, Stair training, UE/LE Strength taining/ROM, Wheelchair propulsion/positioning, UE/LE Coordination activities, Therapeutic Activities, Skin care/wound management, Pain  management, Discharge planning, Training and development officer, Cognitive remediation/compensation, Patient/family education, Functional mobility training, Disease management/prevention, Splinting/orthotics, Therapeutic Exercise, Visual/perceptual remediation/compensation  OT Interventions Training and development officer, DME/adaptive equipment instruction, Patient/family education, Therapeutic Activities, Wheelchair propulsion/positioning, Therapeutic Exercise, Psychosocial support, Cognitive remediation/compensation, Functional electrical stimulation, Community reintegration, Functional mobility training, Self Care/advanced ADL retraining, UE/LE Strength taining/ROM, UE/LE Coordination activities, Skin care/wound managment, Neuromuscular re-education, Discharge planning, Disease mangement/prevention, Pain management, Splinting/orthotics, Visual/perceptual remediation/compensation  SLP Interventions    TR Interventions    SW/CM Interventions Discharge Planning, Psychosocial Support, Patient/Family Education, Disease Management/Prevention   Barriers to Discharge MD  Medical stability  Nursing Decreased caregiver support 1 level 3 ste bil rails main B+B;New dx of metastatic prostate cancer, plan for palliative radiation starting 2/19.  Dispo: home to pt's home with spouse and GGD living in the home, numerous family members (4 sons and multiple grands/great grands) to provide additional support  PT Inaccessible home environment, Decreased caregiver support, Home environment Child psychotherapist, Insurance for SNF coverage, Pending chemo/radiation    OT Neurogenic Bowel & Bladder    SLP      SW       Team Discharge Planning: Destination: PT-Home ,OT- Home , SLP-  Projected Follow-up: PT-Home health PT, OT-  Home health OT, SLP-  Projected Equipment Needs: PT-To be determined, OT- To be determined, SLP-  Equipment Details: PT- , OT-  Patient/family involved in discharge planning: PT- Patient, Family  member/caregiver,  OT-Patient, Family member/caregiver, SLP-   MD ELOS: 7-10 days Medical Rehab Prognosis:  Excellent Assessment: The patient has been admitted for CIR therapies with the diagnosis of metastatic prostate  cancer. The team will be addressing functional mobility, strength, stamina, balance, safety, adaptive techniques and equipment, self-care, bowel and bladder mgt, patient and caregiver education. Goals have been set at supervision. Anticipated discharge destination is home.        See Team Conference Notes for weekly updates to the plan of care

## 2022-10-17 NOTE — Progress Notes (Signed)
Patient ID: Nicholas Burton, male   DOB: 1928/04/15, 89 y.o.   MRN: [id number]  Patient radiation appointment schedule for 12 PM. Care Link arranged for 11:30 AM. Packet left at nursing station. Original DNR form in packet.

## 2022-10-17 NOTE — Progress Notes (Signed)
Occupational Therapy Session Note  Patient Details  Name: Nicholas Burton MRN: [id number] Date of Birth: August 05, 1928  Today's Date: 10/17/2022 OT Individual Time: [id number] & 1105-1200 OT Individual Time Calculation (min): 56 min & 55 min   [place] Term Goals: Week 1:  OT [place] Term Goal 1 (Week 1): STG=LTG d/t ELOS  Skilled Therapeutic Interventions/Progress Updates:  Session 1 Skilled OT intervention completed with focus on ambulatory endurance, tub/shower transfer and DME education. Pt received seated EOB with nursing administering morning meds, agreeable to session. Unrated pain reported in lumbar region; pre-medicated. OT offered rest breaks, repositioning and distraction throughout for pain reduction.  1 of Nicholas 4 sons Burton during session therefore incorporated him into education at [place]. Per report, pt has tub/shower with current shower chair but son was inquiring tub bench. Both agreeable to go to apartment to practice such transfers.  Pt completed all sit > stands with supervision using RW, then ambulatory transfers >100 ft with CGA using RW. Ambulated from room > ADL bathroom, with cues needed for upright gaze and avoidance of leaning on RW. Pt did not need seated break until in apartment. Education provided on DME available such as tub bench that can be purchased for use at home, suggested use of [facility] for ease of bathing, shower curtain management to prevent water spillage, minimizing stands via lateral leans for pericare, use of grab bars/installation as well as effective safety strategies for exiting Nicholas shower to eliminate falls.   Pt was able to return demo ambulatory transfer into bathroom, sit pivot on tub bench with min cues for positioning but overall supervision. Time needed for managing painful LLE over threshold, however discussed modification of extending Nicholas knee vs hip flexion to lift for ease of management. Pt was able to exit in same manner. Son plans to get tub  bench with divider in Nicholas bench for curtain to slide through vs tuck under Nicholas butt method.  Ambulated back to room without rest break, and assisted pt to bathroom with pt able to manage all doffing of clothing in prep for sitting on commode though cues needed for stepping back prior to sitting for proximity of seat. Son already cleared for toileting assist therefore pt remained seated on commode for attempt at [facility] at end of session. All needs met with son Burton at pt side at OT departure.   Session 2 Skilled OT intervention completed with focus on dynamic balance without UE support. Pt received upright in bed, agreeable to session. Unrated pain reported in back of bilateral knees; pre-medicated. OT offered rest breaks, repositioning throughout for pain reduction.  Discussed with 2 of 4 sons pt's status and DC plans while pt finished his early lunch in prep for radiation treatment and transport after OT session. Transitioned to EOB and sit > stand with supervision using RW. Ambulated with CGA to w/c about 10 ft. Transported dependently in w/c <> gym for pain/fatigue management.   Completed supervision sit > stand using RW to participate in standing task at high table involving BUE during pipe tower design. Pt has baseline wet macular degeneration so he demo visual acuity and perception/spatial orientation deficits during tasks but with mod guidance was able to complete Nicholas activities. Difficulty problem solving connector pieces from inset pipe pieces and noted low frustration tolerance. CGA for standing balance with intermittent unilateral UE on table. Seated rest break needed for fatigue.  Upgraded task for higher level balance, with pt standing on air ex pad with light  min A needed to stand up on pad, however CGA for standing balance during peg board design activity on elevated surface. No LOB however would benefit from further dynamic balance challenges with this.  Back at room, OT assisted pt with  CGA sit > stand and ambulatory transfer using RW to transport gurney for radiation treatment at [place] A needed for LLE on gurney. Direct care handoff to NT and EMS transport at end of session.  Therapy Documentation Precautions:  Precautions Precautions: Fall Precaution Comments: no brace yet, scheduled to begin radiation tx on Monday 2/19 Restrictions Weight Bearing Restrictions: No   Therapy/Group: Individual Therapy  Blase Mess, MS, OTR/L  10/17/2022, 12:12 PM

## 2022-10-17 NOTE — Plan of Care (Signed)
  Problem: RH Balance Goal: LTG Patient will maintain dynamic standing balance (PT) Description: LTG:  Patient will maintain dynamic standing balance with assistance during mobility activities (PT) Flowsheets (Taken 10/15/2022 1708) LTG: Pt will maintain dynamic standing balance during mobility activities with:: Supervision/Verbal cueing   Problem: Sit to Stand Goal: LTG:  Patient will perform sit to stand with assistance level (PT) Description: LTG:  Patient will perform sit to stand with assistance level (PT) Flowsheets (Taken 10/15/2022 1708) LTG: PT will perform sit to stand in preparation for functional mobility with assistance level: Independent with assistive device   Problem: RH Bed Mobility Goal: LTG Patient will perform bed mobility with assist (PT) Description: LTG: Patient will perform bed mobility with assistance, with/without cues (PT). Flowsheets (Taken 10/15/2022 1708) LTG: Pt will perform bed mobility with assistance level of: Independent with assistive device    Problem: RH Bed to Chair Transfers Goal: LTG Patient will perform bed/chair transfers w/assist (PT) Description: LTG: Patient will perform bed to chair transfers with assistance (PT). Flowsheets (Taken 10/15/2022 1708) LTG: Pt will perform Bed to Chair Transfers with assistance level: Supervision/Verbal cueing   Problem: RH Car Transfers Goal: LTG Patient will perform car transfers with assist (PT) Description: LTG: Patient will perform car transfers with assistance (PT). Flowsheets (Taken 10/15/2022 1708) LTG: Pt will perform car transfers with assist:: Supervision/Verbal cueing   Problem: RH Furniture Transfers Goal: LTG Patient will perform furniture transfers w/assist (OT/PT) Description: LTG: Patient will perform furniture transfers  with assistance (OT/PT). Flowsheets (Taken 10/15/2022 1708) LTG: Pt will perform furniture transfers with assist:: Supervision/Verbal cueing   Problem: RH Ambulation Goal:  LTG Patient will ambulate in controlled environment (PT) Description: LTG: Patient will ambulate in a controlled environment, # of feet with assistance (PT). Flowsheets (Taken 10/15/2022 1708) LTG: Pt will ambulate in controlled environ  assist needed:: Supervision/Verbal cueing LTG: Ambulation distance in controlled environment: up to 200 ft using LRAD Goal: LTG Patient will ambulate in home environment (PT) Description: LTG: Patient will ambulate in home environment, # of feet with assistance (PT). Flowsheets (Taken 10/15/2022 1708) LTG: Pt will ambulate in home environ  assist needed:: Supervision/Verbal cueing LTG: Ambulation distance in home environment: up to 32f per bout using LRAD   Problem: RH Stairs Goal: LTG Patient will ambulate up and down stairs w/assist (PT) Description: LTG: Patient will ambulate up and down # of stairs with assistance (PT) Flowsheets (Taken 10/15/2022 1708) LTG: Pt will ambulate up/down stairs assist needed:: Supervision/Verbal cueing LTG: Pt will  ambulate up and down number of stairs: Sixteen 3" steps using HR as per home envronment

## 2022-10-17 NOTE — Plan of Care (Signed)
  Problem: RH BLADDER ELIMINATION Goal: RH STG MANAGE BLADDER WITH ASSISTANCE Description: STG Manage Bladder With total Assistance Outcome: Not Progressing; foley cath

## 2022-10-18 ENCOUNTER — Other Ambulatory Visit: Payer: Self-pay

## 2022-10-18 ENCOUNTER — Ambulatory Visit
Admit: 2022-10-18 | Discharge: 2022-10-18 | Disposition: A | Discharge status: Home / Self Care | Payer: Medicare Other | Attending: Radiation Oncology | Admitting: Radiation Oncology

## 2022-10-18 DIAGNOSIS — Z510 Encounter for antineoplastic radiation therapy: Secondary | ICD-10-CM | POA: Diagnosis not present

## 2022-10-18 DIAGNOSIS — C61 Malignant neoplasm of prostate: Secondary | ICD-10-CM | POA: Diagnosis not present

## 2022-10-18 DIAGNOSIS — C7951 Secondary malignant neoplasm of bone: Secondary | ICD-10-CM | POA: Diagnosis not present

## 2022-10-18 LAB — GLUCOSE, CAPILLARY
Glucose-Capillary: 84 mg/dL (ref 70–99)
Glucose-Capillary: 96 mg/dL (ref 70–99)
Glucose-Capillary: 330 mg/dL — ABNORMAL HIGH (ref 70–99)
Glucose-Capillary: 218 mg/dL — ABNORMAL HIGH (ref 70–99)

## 2022-10-18 LAB — RAD ONC ARIA SESSION SUMMARY
Course Elapsed Days: 1
Plan Fractions Treated to Date: 2
Plan Prescribed Dose Per Fraction: 3 Gy
Plan Total Fractions Prescribed: 10
Plan Total Prescribed Dose: 30 Gy
Reference Point Dosage Given to Date: 6 Gy
Reference Point Session Dosage Given: 3 Gy
Session Number: 2

## 2022-10-18 MED ORDER — CHLORHEXIDINE GLUCONATE CLOTH 2 % EX PADS: 6.0000 | MEDICATED_PAD | Freq: Every day | CUTANEOUS | Status: DC

## 2022-10-18 MED ORDER — MAGNESIUM GLUCONATE 500 MG PO TABS
500.0000 mg | ORAL_TABLET | Freq: Every day | ORAL | Status: DC
  Administered 2022-10-18: 500 mg via ORAL
  Filled 2022-10-18: qty 1

## 2022-10-18 MED ORDER — INSULIN ASPART 100 UNIT/ML IJ SOLN
2.0000 [IU] | Freq: Three times a day (TID) | INTRAMUSCULAR | Status: DC
  Administered 2022-10-18 (×2): 2 [IU] via SUBCUTANEOUS

## 2022-10-18 MED ORDER — INSULIN GLARGINE-YFGN 100 UNIT/ML ~~LOC~~ SOLN
21.0000 [IU] | Freq: Every day | SUBCUTANEOUS | Status: DC
  Administered 2022-10-18: 21 [IU] via SUBCUTANEOUS
  Filled 2022-10-18 (×2): qty 0.21

## 2022-10-18 MED ORDER — CHLORHEXIDINE GLUCONATE CLOTH 2 % EX PADS
6.0000 | MEDICATED_PAD | Freq: Two times a day (BID) | CUTANEOUS | Status: DC
  Administered 2022-10-18 – 2022-10-19 (×2): 6 via TOPICAL

## 2022-10-18 NOTE — Progress Notes (Signed)
PROGRESS NOTE   Subjective/Complaints: No new complaints this morning WBC elevated but may be secondary to steroids. Will repeat tomorrow to trend Provided list of foods for diabetes  ROS: +low back pain, denies chest pain or shortness of breath   Objective:   No results found. Recent Labs    10/17/22 0611  WBC 15.3*  HGB 10.7*  HCT 32.9*  PLT 362    Recent Labs    10/17/22 0611  NA 138  K 3.5  CL 103  CO2 24  GLUCOSE 90  BUN 25*  CREATININE 1.54*  CALCIUM 8.3*     Intake/Output Summary (Last 24 hours) at 10/18/2022 1043 Last data filed at 10/18/2022 0459 Gross per 24 hour  Intake 480 ml  Output 2050 ml  Net -1570 ml        Physical Exam: Vital Signs Blood pressure (!) 143/79, pulse 89, temperature 98.6 F (37 C), temperature source Oral, resp. rate 16, height 5' 9"$  (1.753 m), weight 77.1 kg, SpO2 99 %. Gen: no distress, normal appearing, BMI 25.1 HEENT: oral mucosa pink and moist, NCAT, hard of hearing Cardio: Reg rate Chest: normal effort, normal rate of breathing Abd: soft, non-distended, positive bowel sounds Ext: no edema Psych: pleasant, normal affect GU: Not examined. +Foley, draining clear urine.  Skin: C/D/I. No apparent lesions. MSK:      No apparent deformity.      Strength:                RUE: 5/5 SA, 5/5 EF, 5/5 EE, 5/5 WE, 5/5 FF, 5/5 FA                 LUE: 5/5 SA, 5/5 EF, 5/5 EE, 5/5 WE, 5/5 FF, 5/5 FA                 RLE: 5/5 HF, 5/5 KE, 5/5 DF, 5/5 EHL, 5/5 PF                 LLE:  3/5 HF, 4/5 KE, 5/5 DF, 5/5 EHL, 5/5 PF    Neurologic exam:  Cognition: AAO to person, place, time and event.  Language: Fluent, No substitutions or neoglisms. No dysarthria. Names 3/3 objects correctly.  Memory: Recalls 1/3 objects at 5 minutes. + significant attention and memory deficits Insight: Poor insight into current condition.  Mood: Pleasant affect, appropriate mood.  Sensation: To  light touch intact in BL UEs and LEs  Reflexes: 2+ in BL UE and LEs. Negative Hoffman's and babinski signs bilaterally.  CN: 2-12 grossly intact.  Coordination: No apparent tremors. No ataxia on FTN, HTS bilaterally.  Spasticity: MAS 0 in all extremities.  GU: foley in place    Assessment/Plan: 1. Functional deficits which require 3+ hours per day of interdisciplinary therapy in a comprehensive inpatient rehab setting. Physiatrist is providing close team supervision and 24 hour management of active medical problems listed below. Physiatrist and rehab team continue to assess barriers to discharge/monitor patient progress toward functional and medical goals  Care Tool:  Bathing    Body parts bathed by patient: Right arm, Left arm, Chest, Abdomen, Front perineal area, Buttocks, Right upper leg, Left upper  leg, Right lower leg, Left lower leg, Face         Bathing assist Assist Level: Minimal Assistance - Patient > 75%     Upper Body Dressing/Undressing Upper body dressing   What is the patient wearing?: Pull over shirt    Upper body assist Assist Level: Minimal Assistance - Patient > 75%    Lower Body Dressing/Undressing Lower body dressing      What is the patient wearing?: Pants, Underwear/pull up     Lower body assist Assist for lower body dressing: Minimal Assistance - Patient > 75%     Toileting Toileting    Toileting assist Assist for toileting: Minimal Assistance - Patient > 75%     Transfers Chair/bed transfer  Transfers assist     Chair/bed transfer assist level: Contact Guard/Touching assist     Locomotion Ambulation   Ambulation assist      Assist level: Supervision/Verbal cueing Assistive device: Walker-rolling Max distance: 178f   Walk 10 feet activity   Assist     Assist level: Supervision/Verbal cueing Assistive device: Walker-rolling   Walk 50 feet activity   Assist    Assist level: Supervision/Verbal cueing Assistive  device: Walker-rolling    Walk 150 feet activity   Assist    Assist level: Supervision/Verbal cueing Assistive device: Walker-rolling    Walk 10 feet on uneven surface  activity   Assist Walk 10 feet on uneven surfaces activity did not occur: Safety/medical concerns         Wheelchair     Assist Is the patient using a wheelchair?: No Type of Wheelchair: Manual Wheelchair activity did not occur: Refused         Wheelchair 50 feet with 2 turns activity    Assist    Wheelchair 50 feet with 2 turns activity did not occur: Refused       Wheelchair 150 feet activity     Assist  Wheelchair 150 feet activity did not occur: Refused       Blood pressure (!) 143/79, pulse 89, temperature 98.6 F (37 C), temperature source Oral, resp. rate 16, height 5' 9"$  (1.753 m), weight 77.1 kg, SpO2 99 %.    Medical Problem List and Plan: 1. Functional deficits secondary to metastatic poorly differentiated prostate cancer complicated by acute metabolic encephalopathy.  Follow-up oncology Dr.Kale as well as medical oncology Dr. MLisbeth Renshawfor plan XRT.  Continue Decadron therapy 4 mg daily for now             -patient may shower             -ELOS/Goals: 7-10 days  Continue CIR  Discussed that sons are building ramp 2.  Antithrombotics: -DVT/anticoagulation:  Mechanical: Antiembolism stockings, thigh (TED hose) Bilateral lower extremities             -antiplatelet therapy: N/A 3.Low back pain: kpad ordered. Lidoderm patch as directed, hydrocodone/tramadol as needed 4. Mood/Behavior/Sleep: Melatonin nightly as needed             -antipsychotic agents: N/A 5. Neuropsych/cognition: This patient is capable of making decisions on his own behalf. 6. Skin/Wound Care: Routine skin checks 7. Fluids/Electrolytes/Nutrition: Routine in and outs with follow-up chemistries 8.  Chronic diastolic congestive heart failure.  Monitor for any signs of fluid overload 9.  AKI on CKD/urinary  retention.  Foley tube in place for now after failed voiding trial.  Follow-up outpatient urology services.  Continue Flomax 0.4 mg daily  -2/19 creatinine appears stable at  1.54, BUN 25.  Appears around 3b, Continue to monitor 10.  Diabetes mellitus.  Hemoglobin A1c 8.0.  decrease NovoLog to 2 units 3 times daily, Increase senmglee to 21 U.  Check blood sugars before meals and at bedtime Increase semglee back to home dose to 20U  CBG (last 3)  Recent Labs    10/17/22 1623 10/17/22 2053 10/18/22 0550  GLUCAP 253* 223* 84      11.  Hypertension.  Lopressor 25 mg twice daily.  Monitor with increased mobility. Increase magnesium gluconate to 568mg HS 12.  Constipation.  MiraLAX daily. Add magnesium gluconate 2558mHS  -2/19 improved, last BM yesterday 13. Urinary retention. Reinserted foley 2/17, maintain until outpatient uro follow-up for comfort.  14. History of vitamin D insufficiency: start ergocalciferol 50,000U once per week for 7 weels 15.  Cerumen buildup -Debrox ordered 16.  Elevated liver enzymes, mildly elevated ALT and ALK phosphatase  -Avoid hepatotoxic medications, he does not appear to be using Tylenol very frequently, will d/c tylenol  LOS: 4 days A FACE TO FACE EVALUATION WAS PERFORMED  KrClide Deutscheraulkar 10/18/2022, 10:43 AM

## 2022-10-18 NOTE — Progress Notes (Signed)
Patient ID: AWS MALLARE, male   DOB: 06/02/1928, 89 y.o.   MRN: [id number]  [facility] packet at nursing station for 2 PM pick up.

## 2022-10-18 NOTE — Plan of Care (Signed)
  Problem: RH BLADDER ELIMINATION Goal: RH STG MANAGE BLADDER WITH ASSISTANCE Description: STG Manage Bladder With total Assistance Outcome: Not Progressing; foley    Problem: RH KNOWLEDGE DEFICIT GENERAL Goal: RH STG INCREASE KNOWLEDGE OF SELF CARE AFTER HOSPITALIZATION Description: Patient and family will be able to manage care using educational resources independently Outcome: Progressing; son;s in room at all times to help patient

## 2022-10-18 NOTE — Progress Notes (Signed)
Physical Therapy Session Note  Patient Details  Name: Nicholas Burton MRN: [id number] Date of Birth: 1927/10/31  Today's Date: 10/18/2022 PT Individual Time: 0805-0917 PT Individual Time Calculation (min): 72 min   [place] Term Goals: Week 1:  PT [place] Term Goal 1 (Week 1): STG=LTG d/t ELOS  Skilled Therapeutic Interventions/Progress Updates:    Pt received ambulating out of the bathroom using RW with nursing assistance. Pt agreeable to therapy session so therapist assumed care of pt. Pt performed hand hygiene at sink with CGA for safety. After taking medications, pt reports feeling urge to void again. Gait in/out bathroom using RW with CGA for safety/steadying. Standing with CGA for safety performed LB clothing management without assist. Continent of bowels and performed peri-care without assist. Sit>stand from toilet using grab bar to RW with pt requiring 2nd attempt to come to standing safely due to lack of powering up to stand initially.   Therapist discussed pt's CLOF and D/C plan with his 4th son - he confirms that plan is for pt to have ramp installed over stairs for home entry. Pt reports that his wife was recently in hospital but is now back home on oxygen.  Gait training ~184f to main therapy gym using RW with CGA - slow but adequate gait speed with reciprocal stepping pattern - pt reports fatigue towards end with increasing pain in B LEs.  Seated rest break.  Stair navigation training ascending/descending 4 steps (6" height) using B HRs with CGA/light min assist for safety primarily on descent - step-to pattern leading with R LE on ascent and L LE on descent with pt reporting his L LE is weaker.   Dynamic standing balance task of alternating foot taps to 6" step with +2 B HHA for pt feeling safe, pt lacks confidence in his ability to perform this task - 2x10reps with min assist for balance primarily provided in R HHA.  Gait training ~150fto // bars using +2 B HHA providing light min  assist for balance primarily using R HHA.  Dynamic stepping balance task of lateral stepping over stick in // bars using B UE support on bar with CGA for safety - pt relying heavily on B UE support with excessive forward trunk flexed posture with WBing down through UEs - lacks sufficient step length especially when stepping towards L compared to R.  Progressed to forward/backwards stepping over stick with pt lacking L LE step length compared to R LE especially when stepping backwards.  Pt reporting significant fatigue and increased pain.  Reports lying flat in the bed sometimes alleviates his pain.  Gait training back to his room using RW with CGA and pt demoing increased forward trunk flexed posture with increased WBing down through AD at this time likely due to fatigue. Sit>supine with supervision and increased time to lift B LEs onto bed. Pt left supine in bed for pain management with needs in reach, bed alarm on, and his son present.     Therapy Documentation Precautions:  Precautions Precautions: Fall Precaution Comments: no brace yet, scheduled to begin radiation tx on Monday 2/19 Restrictions Weight Bearing Restrictions: No   Pain:  Pt does not rate pain but does report increasing pain throughout session - premedicated - assisted pt with repositioning into supine at end of session as pt reports this sometimes provides relief.    Therapy/Group: Individual Therapy  CaTawana Scale PT, DPT, NCS, CSRS 10/18/2022, 7:48 AM

## 2022-10-18 NOTE — Progress Notes (Signed)
Physical Therapy Session Note  Patient Details  Name: Nicholas Burton MRN: [id number] Date of Birth: 08-24-1928  Today's Date: 10/18/2022 PT Individual Time: 1032-1101 PT Individual Time Calculation (min): 29 min   [place] Term Goals: Week 1:  PT [place] Term Goal 1 (Week 1): STG=LTG d/t ELOS  Skilled Therapeutic Interventions/Progress Updates:    Patient received in room in bed. Pt able to sit up supine>sit EOB with supervision, no cues needed to sequence and assist to manage foley line. CGA required provided for sit<>stand transfer to RW, pt demonstrated good recall for safe hand placemen tot power up as well as for reach back throughout session from various surfaces. Pt ambulated 300' to gym with RW and CGA assist, pt tends to drift to Rt side of RW but with cues to correct position in RW <25% of gait. Car transfer completed today, CGA to pivot with RW and back up to passenger seat, pt completed with safe reach back to control lower. CGA for pt to bring bil LE's into/out of car, pt using using bil UE's to manage Lt LE. CGA for rise from car to RW and additional [place] bout of ambulation in room to mat table for seated rest break. Pt completed bil UE strengthening (See There Ex) with therapeutic rest breaks between sets due to fatigue and Lt lower back pain radiating into posterolateral thigh. EOS stand pivot transfer mat table>WC>bed completed with CGA/supervision and no cues for sequencing or technique required. Pt was transported back to room dependently in [facility] due to fatigue and time constraints at EOS. Pt returned to supine with supervision and extra time to bring LE's back into bed. Pt left with family at bedside, bed alarm on, and call bell within reach.  Therapeutic Exercise: Bil UE:  -shoulder flexion 2x10, with 5lbs bar -shoulder flex/chest press 2x10, with 5 lbs bar  Therapy Documentation Precautions:  Precautions Precautions: Fall Precaution Comments: no brace yet, scheduled to begin  radiation tx on Monday 2/19 Restrictions Weight Bearing Restrictions: No      Therapy/Group: Individual Therapy   Verner Mould, DPT Acute Rehabilitation Services Office [phone]  10/18/22 10:28 AM

## 2022-10-18 NOTE — Progress Notes (Signed)
Occupational Therapy Session Note  Patient Details  Name: Nicholas Burton MRN: [id number] Date of Birth: 1927-12-29  Today's Date: 10/18/2022 OT Individual Time: 1017-1059 and 1300-1340 OT Individual Time Calculation (min): 42 min and 40 min   [place] Term Goals: Week 1:  OT [place] Term Goal 1 (Week 1): STG=LTG d/t ELOS  Skilled Therapeutic Interventions/Progress Updates:    AM Session:  Patient received supine in bed pleasantly stating "what are you going to do to me."  Son at bedside stating patient just received drops in his ears - so requesting just a moment to allow drops to dry and then put in hearing aides.   Patient walked with walker to wheelchair then transported to ADL apartment for tub/shower transfer.  Son had pictures of current shower and has already purchased tub transfer bench.  Patient able to complete trasnfer with supervision (close) following demonstration.  Also practiced bed mobility in apartment.  Patient and son discussing moving to opposite side of bed to make transfer to bathroom at night easier.  Patient returned to room, and returned to bed for brief rest prior to next session.  Son at bedside.    PM Session:  Patient received supine in bed - agreeable to shower before transport for radiation.  Patient walked to bathroom and showered sitting on bench.  Patient made therapist aware when intending to stand, and was thorough in bathing and rinsing himself.  Patient required assistance to manage catheter bag through pant legs, and with tying right shoelace.  Patient's son present for this session.  Patient left at edge of bed - medical transport here, familiar with patient, and assisting him.    Therapy Documentation Precautions:  Precautions Precautions: Fall Precaution Comments: no brace yet, scheduled to begin radiation tx on Monday 2/19 Restrictions Weight Bearing Restrictions: No  Pain: Reported low back pain - did not rate.  Patient returned to bed - supine with  head of bed up.  Reported relief  Therapy/Group: Individual Therapy  Mariah Milling 10/18/2022, 12:40 PM

## 2022-10-19 ENCOUNTER — Ambulatory Visit
Admission: RE | Admit: 2022-10-19 | Discharge: 2022-10-19 | Disposition: A | Discharge status: Home / Self Care | Payer: Medicare Other | Source: Ambulatory Visit | Attending: Radiation Oncology | Admitting: Radiation Oncology

## 2022-10-19 ENCOUNTER — Other Ambulatory Visit: Payer: Self-pay

## 2022-10-19 DIAGNOSIS — Z510 Encounter for antineoplastic radiation therapy: Secondary | ICD-10-CM | POA: Diagnosis not present

## 2022-10-19 DIAGNOSIS — C7951 Secondary malignant neoplasm of bone: Secondary | ICD-10-CM | POA: Diagnosis not present

## 2022-10-19 DIAGNOSIS — C61 Malignant neoplasm of prostate: Secondary | ICD-10-CM | POA: Diagnosis not present

## 2022-10-19 LAB — PSA, TOTAL AND FREE
PSA, Free Pct: 56.8 %
PSA, Free: 31.3 ng/mL
Prostate Specific Ag, Serum: 55.1 ng/mL — ABNORMAL HIGH (ref 0.0–4.0)

## 2022-10-19 LAB — CBC WITH DIFFERENTIAL/PLATELET
Abs Immature Granulocytes: 0.12 10*3/uL — ABNORMAL HIGH (ref 0.00–0.07)
Basophils Absolute: 0 10*3/uL (ref 0.0–0.1)
Basophils Relative: 0 %
Eosinophils Absolute: 0.2 10*3/uL (ref 0.0–0.5)
Eosinophils Relative: 2 %
HCT: 34.5 % — ABNORMAL LOW (ref 39.0–52.0)
Hemoglobin: 11.3 g/dL — ABNORMAL LOW (ref 13.0–17.0)
Immature Granulocytes: 1 %
Lymphocytes Relative: 26 %
Lymphs Abs: 3.6 10*3/uL (ref 0.7–4.0)
MCH: 29.1 pg (ref 26.0–34.0)
MCHC: 32.8 g/dL (ref 30.0–36.0)
MCV: 88.9 fL (ref 80.0–100.0)
Monocytes Absolute: 1.6 10*3/uL — ABNORMAL HIGH (ref 0.1–1.0)
Monocytes Relative: 11 %
Neutro Abs: 8.2 10*3/uL — ABNORMAL HIGH (ref 1.7–7.7)
Neutrophils Relative %: 60 %
Platelets: 358 10*3/uL (ref 150–400)
RBC: 3.88 MIL/uL — ABNORMAL LOW (ref 4.22–5.81)
RDW: 14.9 % (ref 11.5–15.5)
WBC: 13.7 10*3/uL — ABNORMAL HIGH (ref 4.0–10.5)
nRBC: 0 % (ref 0.0–0.2)

## 2022-10-19 LAB — RAD ONC ARIA SESSION SUMMARY
Course Elapsed Days: 2
Plan Fractions Treated to Date: 3
Plan Prescribed Dose Per Fraction: 3 Gy
Plan Total Fractions Prescribed: 10
Plan Total Prescribed Dose: 30 Gy
Reference Point Dosage Given to Date: 9 Gy
Reference Point Session Dosage Given: 3 Gy
Session Number: 3

## 2022-10-19 LAB — GLUCOSE, CAPILLARY
Glucose-Capillary: 84 mg/dL (ref 70–99)
Glucose-Capillary: 154 mg/dL — ABNORMAL HIGH (ref 70–99)
Glucose-Capillary: 322 mg/dL — ABNORMAL HIGH (ref 70–99)

## 2022-10-19 MED ORDER — INSULIN ASPART 100 UNIT/ML IJ SOLN
1.0000 [IU] | Freq: Three times a day (TID) | INTRAMUSCULAR | Status: DC
  Administered 2022-10-19 – 2022-10-20 (×4): 1 [IU] via SUBCUTANEOUS

## 2022-10-19 MED ORDER — INSULIN GLARGINE-YFGN 100 UNIT/ML ~~LOC~~ SOLN
11.0000 [IU] | Freq: Every day | SUBCUTANEOUS | Status: DC
  Administered 2022-10-19: 11 [IU] via SUBCUTANEOUS
  Filled 2022-10-19: qty 0.11

## 2022-10-19 MED ORDER — CARBAMIDE PEROXIDE 6.5 % OT SOLN
5.0000 [drp] | Freq: Two times a day (BID) | OTIC | Status: DC
  Administered 2022-10-19 – 2022-10-22 (×4): 5 [drp] via OTIC
  Filled 2022-10-19: qty 15

## 2022-10-19 MED ORDER — HYDROCODONE-ACETAMINOPHEN 5-325 MG PO TABS
1.0000 | ORAL_TABLET | ORAL | Status: DC | PRN
  Administered 2022-10-20: 2 via ORAL
  Administered 2022-10-20 (×2): 1 via ORAL
  Filled 2022-10-19 (×2): qty 2
  Filled 2022-10-19: qty 1
  Filled 2022-10-19: qty 2
  Filled 2022-10-19: qty 1

## 2022-10-19 MED ORDER — SIMETHICONE 80 MG PO CHEW
80.0000 mg | CHEWABLE_TABLET | Freq: Every day | ORAL | Status: DC
  Administered 2022-10-19 – 2022-10-21 (×3): 80 mg via ORAL
  Filled 2022-10-19 (×4): qty 1

## 2022-10-19 MED ORDER — TAMSULOSIN HCL 0.4 MG PO CAPS: 0.4000 mg | ORAL_CAPSULE | Freq: Every day | ORAL | Status: DC

## 2022-10-19 MED ORDER — CHLORHEXIDINE GLUCONATE CLOTH 2 % EX PADS: 6.0000 | MEDICATED_PAD | Freq: Every day | CUTANEOUS | Status: DC

## 2022-10-19 MED ORDER — INSULIN ASPART 100 UNIT/ML IJ SOLN
1.0000 [IU] | Freq: Three times a day (TID) | INTRAMUSCULAR | Status: DC
  Administered 2022-10-19: 1 [IU] via SUBCUTANEOUS

## 2022-10-19 MED ORDER — CHLORHEXIDINE GLUCONATE CLOTH 2 % EX PADS
6.0000 | MEDICATED_PAD | Freq: Two times a day (BID) | CUTANEOUS | Status: DC
  Administered 2022-10-19 – 2022-10-21 (×5): 6 via TOPICAL

## 2022-10-19 MED ORDER — LIDOCAINE 5 % EX PTCH
1.0000 | MEDICATED_PATCH | CUTANEOUS | Status: DC
  Administered 2022-10-20 – 2022-10-22 (×3): 1 via TRANSDERMAL
  Filled 2022-10-19 (×3): qty 1

## 2022-10-19 MED ORDER — VITAMIN D (ERGOCALCIFEROL) 1.25 MG (50000 UNIT) PO CAPS
50000.0000 [IU] | ORAL_CAPSULE | ORAL | Status: DC
  Administered 2022-10-22: 50000 [IU] via ORAL
  Filled 2022-10-19: qty 1

## 2022-10-19 MED ORDER — LIDOCAINE 5 % EX PTCH: 1.0000 | MEDICATED_PATCH | CUTANEOUS | Status: DC

## 2022-10-19 MED ORDER — INSULIN ASPART 100 UNIT/ML IJ SOLN
0.0000 [IU] | Freq: Three times a day (TID) | INTRAMUSCULAR | Status: DC
  Administered 2022-10-19: 11 [IU] via SUBCUTANEOUS
  Administered 2022-10-20: 1 [IU] via SUBCUTANEOUS
  Administered 2022-10-20: 9 [IU] via SUBCUTANEOUS
  Administered 2022-10-21: 8 [IU] via SUBCUTANEOUS
  Administered 2022-10-21: 3 [IU] via SUBCUTANEOUS

## 2022-10-19 MED ORDER — MELATONIN 3 MG PO TABS: 3.0000 mg | ORAL_TABLET | Freq: Every evening | ORAL | Status: DC | PRN

## 2022-10-19 MED ORDER — TAMSULOSIN HCL 0.4 MG PO CAPS
0.4000 mg | ORAL_CAPSULE | Freq: Every day | ORAL | Status: DC
  Administered 2022-10-19 – 2022-10-21 (×3): 0.4 mg via ORAL
  Filled 2022-10-19 (×4): qty 1

## 2022-10-19 MED ORDER — METOPROLOL TARTRATE 25 MG PO TABS
25.0000 mg | ORAL_TABLET | Freq: Two times a day (BID) | ORAL | Status: DC
  Administered 2022-10-19: 25 mg via ORAL
  Filled 2022-10-19: qty 1

## 2022-10-19 MED ORDER — INSULIN GLARGINE-YFGN 100 UNIT/ML ~~LOC~~ SOLN
11.0000 [IU] | Freq: Two times a day (BID) | SUBCUTANEOUS | Status: DC
  Administered 2022-10-19 – 2022-10-20 (×2): 11 [IU] via SUBCUTANEOUS
  Filled 2022-10-19 (×3): qty 0.11

## 2022-10-19 MED ORDER — MAGNESIUM GLUCONATE 500 MG PO TABS
500.0000 mg | ORAL_TABLET | Freq: Every day | ORAL | Status: DC
  Administered 2022-10-19 – 2022-10-21 (×3): 500 mg via ORAL
  Filled 2022-10-19 (×3): qty 1

## 2022-10-19 MED ORDER — SIMETHICONE 80 MG PO CHEW: 80.0000 mg | CHEWABLE_TABLET | Freq: Every day | ORAL | Status: DC

## 2022-10-19 MED ORDER — ACETAMINOPHEN 325 MG PO TABS
325.0000 mg | ORAL_TABLET | Freq: Four times a day (QID) | ORAL | Status: DC | PRN
  Administered 2022-10-21: 650 mg via ORAL
  Filled 2022-10-19 (×3): qty 2

## 2022-10-19 MED ORDER — METOPROLOL TARTRATE 12.5 MG HALF TABLET
12.5000 mg | ORAL_TABLET | Freq: Two times a day (BID) | ORAL | Status: DC
  Administered 2022-10-20: 12.5 mg via ORAL
  Filled 2022-10-19: qty 1

## 2022-10-19 MED ORDER — POLYETHYLENE GLYCOL 3350 17 G PO PACK
17.0000 g | PACK | Freq: Every day | ORAL | Status: DC
  Administered 2022-10-20: 17 g via ORAL
  Filled 2022-10-19 (×2): qty 1

## 2022-10-19 MED ORDER — DEXAMETHASONE 4 MG PO TABS
4.0000 mg | ORAL_TABLET | Freq: Every day | ORAL | Status: DC
  Administered 2022-10-20 – 2022-10-22 (×3): 4 mg via ORAL
  Filled 2022-10-19 (×3): qty 1

## 2022-10-19 NOTE — Progress Notes (Signed)
Occupational Therapy Discharge Summary  Patient Details  Name: Nicholas Burton MRN: [id number] Date of Birth: November 02, 1927  Date of Discharge from OT service:October 21, 2022  Patient has met 9 of 9 long term goals due to improved activity tolerance, improved balance, and improved coordination. Patient to discharge at overall Supervision level.  Patient's multiple sons and other family members are independent to provide the necessary physical and cognitive assistance at discharge and have attended OT sessions to receive education and prepare for pt's DC at his CLOF.   All goals met  Recommendation:  Patient will benefit from ongoing skilled OT services in home health setting to continue to advance functional skills in the area of BADL, iADL, and Reduce care partner burden.  Equipment: Family purchased TTB privately  Reasons for discharge: treatment goals met  Patient/family agrees with progress made and goals achieved: Yes  OT Discharge Precautions/Restrictions  Precautions Precautions: Fall Precaution Comments: active prostate/lumbar cancer with radiation treatments Restrictions Weight Bearing Restrictions: No ADL ADL Eating: Independent Where Assessed-Eating: Bed level Grooming: Supervision/safety Where Assessed-Grooming: Standing at sink Upper Body Bathing: Modified independent Where Assessed-Upper Body Bathing: Shower Lower Body Bathing: Supervision/safety Where Assessed-Lower Body Bathing: Shower Upper Body Dressing: Setup Where Assessed-Upper Body Dressing: Edge of bed Lower Body Dressing: Supervision/safety Where Assessed-Lower Body Dressing: Edge of bed Toileting: Supervision/safety Where Assessed-Toileting: Glass blower/designer: Close supervision Toilet Transfer Method: Counselling psychologist: Raised toilet seat, Grab bars Tub/Shower Transfer: Close supervison Clinical cytogeneticist Method: Ambulating, Sit pivot Tub/Shower Equipment: Leisure centre manager: Close supervision Social research officer, government Method: Heritage manager: Radio broadcast assistant, Grab bars Vision Baseline Vision/History: 6 Macular Degeneration;1 Wears glasses Patient Visual Report: No change from baseline Vision Assessment?: No apparent visual deficits Perception  Perception: Within Functional Limits Praxis Praxis: Intact Cognition Cognition Overall Cognitive Status: History of cognitive impairments - at baseline Arousal/Alertness: Awake/alert Orientation Level: Person;Place;Situation Person: Oriented Place: Oriented Situation: Oriented Memory: Impaired Awareness: Appears intact Problem Solving: Impaired Safety/Judgment: Impaired Brief Interview for Mental Status (BIMS) Repetition of Three Words (First Attempt): 2 Temporal Orientation: Year: Correct Temporal Orientation: Month: Accurate within 5 days Temporal Orientation: Day: Correct Recall: "Sock": No, could not recall Recall: "Blue": Yes, no cue required Recall: "Bed": Yes, no cue required BIMS Summary Score: 12 Sensation Sensation Light Touch: Appears Intact Hot/Cold: Appears Intact Proprioception: Appears Intact Stereognosis: Not tested Coordination Gross Motor Movements are Fluid and Coordinated: No Fine Motor Movements are Fluid and Coordinated: No Coordination and Movement Description: LBP prevents smooth movement patterns Finger Nose Finger Test: Low vision and dysmetria challenging coordination Motor  Motor Motor: Within Functional Limits Motor - Skilled Clinical Observations: age-related generalized weakness/deconditioning and pain; moves slowly overall Mobility  Bed Mobility Bed Mobility: Rolling Right;Rolling Left;Supine to Sit;Sit to Supine Rolling Right: Independent with assistive device Rolling Left: Independent with assistive device Supine to Sit: Independent with assistive device Sit to Supine: Independent with assistive  device Transfers Sit to Stand: Independent with assistive device Stand to Sit: Independent with assistive device  Trunk/Postural Assessment  Cervical Assessment Cervical Assessment: Exceptions to [facility] (forward head) Thoracic Assessment Thoracic Assessment: Exceptions to [facility] (thoracic rounding) Lumbar Assessment Lumbar Assessment: Exceptions to [facility] (decreased lordosis with neutral to posterior pelvic tilt most likely d/t pain) Postural Control Postural Control: Within Functional Limits  Balance Balance Balance Assessed: Yes Static Sitting Balance Static Sitting - Balance Support: Feet supported;No upper extremity supported Static Sitting - Level of Assistance: 6: Modified independent (Device/Increase time) Dynamic  Sitting Balance Dynamic Sitting - Balance Support: Feet supported;No upper extremity supported Dynamic Sitting - Level of Assistance: 5: Stand by assistance (supervision) Static Standing Balance Static Standing - Balance Support: Bilateral upper extremity supported;During functional activity (RW) Static Standing - Level of Assistance: 6: Modified independent (Device/Increase time) Dynamic Standing Balance Dynamic Standing - Balance Support: Bilateral upper extremity supported;During functional activity (RW) Dynamic Standing - Level of Assistance: 6: Modified independent (Device/Increase time) Dynamic Standing - Comments: with transfers only Extremity/Trunk Assessment RUE Assessment RUE Assessment: Within Functional Limits LUE Assessment LUE Assessment: Within Functional Limits   Oskar Cretella E Cali Cuartas, MS, OTR/L  10/21/2022, 12:09 PM

## 2022-10-19 NOTE — Progress Notes (Signed)
Patient ID: Nicholas Burton, male   DOB: October 23, 1927, 89 y.o.   MRN: [id number]  Team Conference Report to Patient/Family  Team Conference discussion was reviewed with the patient and caregiver, including goals, any changes in plan of care and target discharge date.  Patient and caregiver express understanding and are in agreement.  The patient has a target discharge date of 10/22/22.   Sw met with patient and son at bedside and provided team conference updates. Patient and son aware that pt will d/c with foley. Son would like to ensure that pt's urology referral is entered before d/c. Son informed of voiding trials. No additional questions or concerns.  Dyanne Iha 10/19/2022, 2:15 PM

## 2022-10-19 NOTE — Patient Care Conference (Signed)
Inpatient RehabilitationTeam Conference and Plan of Care Update Date: 10/19/2022   Time: 11:49 AM    Patient Name: Nicholas Burton      Medical Record Number: [id number]  Date of Birth: 08-14-1928 Sex: Male         Room/Bed: 4W08C/4W08C-01 Payor Info: Payor: MEDICARE / Plan: MEDICARE PART A AND B / Product Type: *No Product type* /    Admit Date/Time:  10/14/2022 12:42 PM  Primary Diagnosis:  Prostate cancer [facility])  Hospital Problems: Principal Problem:   Prostate cancer [facility])    Expected Discharge Date: Expected Discharge Date: 10/22/22  Team Members Present: Physician leading conference: Dr. Leeroy Cha Social Worker Present: Erlene Quan, [place] Nurse Present: Dorien Chihuahua, RN PT Present: Becky Sax, PT OT Present: Jennefer Bravo, OT     Current Status/Progress Goal Weekly Team Focus  Bowel/Bladder   Patient with foley cath, continent of bowel, LBM 2/20.24      Continent of bowel and able to manage foley w min assist Assess toileting needs    Swallow/Nutrition/ Hydration               ADL's   Supervision/set up A UB bathing/dressing, min A LB bathing/dressing   supervision   DC planning, DME education, family education, ADL retraining, AE education, general strengthing, pain management    Mobility   bed mobility supervision, transfers with RW CGA/supervision, gait 170f with RW CGA/supervision   supervision overall  functional mobility/transfers, generalied strengthening and endurance, dynamic standing balance/coordination, gait training, D/C planning, and family education    Communication                Safety/Cognition/ Behavioral Observations               Pain   verbalize back pain, prn as needed provided   < 3/10   Continue to assess pain QS/PRN, provide medication as needed ,encourage pain meds prior th therapy    Skin   Skin intact,   Maintain skin integrity,no reports of skin break down or ittiation  QS/PRN skin assessments       Discharge Planning:  [place] with support form son, live in granddaughter and other grandchildren and family to assist. Carelink through 2/23   Team Discussion: Patient continues with foley after two failed voiding trials on flomax. MD adjusting insulin; wean for discharge.  Patient on target to meet rehab goals: yes, currently needs set up for ADLs, supervision for bed mobility and transfers. Needs close supervision to ambulate up to 150' using a RW and min assist to manage steps with bil rails.  Goals for discharge set for supervision overall.  *See Care Plan and progress notes for long and [place]-term goals.   Revisions to Treatment Plan:  N/a   Teaching Needs: Safety, indwelling catheter care, medications, transfers, etc.   Current Barriers to Discharge: Home enviroment access/layout  Possible Resolutions to Barriers: Family education [place]     Medical Summary Current Status: overweight, prostate cancer, low back pain, urinary retention, uncontrolled type 2 diabetes, failure to thrive  Barriers to Discharge: Medical stability;Uncontrolled Diabetes  Barriers to Discharge Comments: overweight, prostate cancer, low back pain, urinary retention, uncontrolled type 2 diabetes, failure to thrive Possible Resolutions to BCelanese CorporationFocus: provided dMerrill Lyncheducation, discusses risks and benefits of ratiation, kpad ordered, continue flomax, changed long acting insulin to 11U BID, discussed modafinil   Continued Need for Acute Rehabilitation Level of  Care: The patient requires daily medical management by a physician with specialized training in physical medicine and rehabilitation for the following reasons: Direction of a multidisciplinary physical rehabilitation program to maximize functional independence : Yes Medical management of patient stability for increased activity during participation in an  intensive rehabilitation regime.: Yes Analysis of laboratory values and/or radiology reports with any subsequent need for medication adjustment and/or medical intervention. : Yes   I attest that I was present, lead the team conference, and concur with the assessment and plan of the team.   Dorien Chihuahua B 10/19/2022, 2:34 PM

## 2022-10-19 NOTE — Progress Notes (Signed)
Occupational Therapy Session Note  Patient Details  Name: Nicholas Burton MRN: [id number] Date of Birth: 26-Jan-1928  Today's Date: 10/19/2022 OT Individual Time: 0920-1030 OT Individual Time Calculation (min): 70 min    [place] Term Goals: Week 1:  OT [place] Term Goal 1 (Week 1): STG=LTG d/t ELOS  Skilled Therapeutic Interventions/Progress Updates:  Skilled OT intervention completed with focus on self-care needs, functional transfers/ambulatory endurance, DC planning, BUE exercises. Pt received upright in bed with nursing administering meds, agreeable to session. Unrated pain reported in lumbar region; pre-medicated/lidocaine patch applied. OT offered rest breaks, repositioning and distraction throughout for pain reduction.  Transitioned to EOB with mod I. Sit > stand and ambulatory transfers using RW with supervision this session. Transferred to w/c at sink in prep for facial grooming. Able to shave entire face and head (preference) with electric razor though son assisted with ensuring accuracy due to low vision and weak battery on electric trimmer.  Ambulated <> gym with supervision using RW. Only directional cues needed. Sat EOM and completed the following exercises for general strengthening: (With yellow theraband) 10 reps Horizontal abduction Self-anchored shoulder flexion each arm Self-anchored bicep flexion each arm Self-anchored tricep extension each arm Therapist anchored scapular retraction each arm Shoulder external rotation -Multimodal cues needed for technique.  Extensive DC planning and education provided during session including the following: -Notifying care team about family concerns regarding permanence of foley, coordinating nursing education for foley care maintenance -Recommending that his sons follow up with oncology regarding questions about why they are treating the lumbar tumor vs prostate for cancer at this time -Infection/UTI prevention methods including keeping  foley below the bladder, preventing back up fluids in tubing, and foley care after a BM -Review of supervision level for DC  Back in room, pt sat EOB, completed supervision bed mobility to supine. Pt remained supine in bed, with bed alarm on/activated, and with all needs in reach at end of session.   Therapy Documentation Precautions:  Precautions Precautions: Fall Precaution Comments: active prostate/lumbar cancer with radiation treatments Restrictions Weight Bearing Restrictions: No    Therapy/Group: Individual Therapy  Blase Mess, MS, OTR/L  10/19/2022, 12:02 PM

## 2022-10-19 NOTE — Discharge Summary (Signed)
Physician Discharge Summary  Patient ID: Nicholas Burton MRN: [id number]:[id number] DOB/AGE: April 04, 1928 89 y.o.  Admit date: 10/14/2022 Discharge date: 10/22/2022  Discharge Diagnoses:  Principal Problem:   Prostate cancer ([place]) Low back pain AKI on CKD Chronic diastolic congestive heart failure Urinary retention/BPH Diabetes mellitus Hypertension Constipation Memory impairment Hard of hearing Obesity  Discharged Condition: Stable  Significant Diagnostic Studies: CT CHEST ABDOMEN PELVIS WO CONTRAST  Result Date: 10/11/2022 CLINICAL DATA:  Pancreatic cancer staging EXAM: CT CHEST, ABDOMEN AND PELVIS WITHOUT CONTRAST TECHNIQUE: Multidetector CT imaging of the chest, abdomen and pelvis was performed following the standard protocol without IV contrast. RADIATION DOSE REDUCTION: This exam was performed according to the departmental dose-optimization program which includes automated exposure control, adjustment of the mA and/or kV according to patient size and/or use of iterative reconstruction technique. COMPARISON:  PET-CT 10/05/2005. Lumbar spine CT 10/05/2022. MRI lumbar spine 10/05/2022. FINDINGS: CT CHEST FINDINGS Cardiovascular: No significant vascular findings. Normal heart size. No pericardial effusion. Mediastinum/Nodes: No enlarged mediastinal, hilar, or axillary lymph nodes. Thyroid gland, trachea, and esophagus demonstrate no significant findings. Lungs/Pleura: Lungs are clear. No pleural effusion or pneumothorax. Musculoskeletal: No chest wall mass or suspicious bone lesions identified. Multilevel degenerative changes affect the spine. CT ABDOMEN PELVIS FINDINGS Hepatobiliary: No focal liver abnormality is seen. Status post cholecystectomy. No biliary dilatation. Pancreas: No pancreatic ductal dilatation or surrounding inflammatory changes. There is atrophy of the pancreatic body and tail. Spleen: Normal in size without focal abnormality. Adrenals/Urinary Tract: Bilateral kidneys and adrenal glands  are within normal limits. Bladder is decompressed with Foley catheter. There is diffuse bladder wall thickening with surrounding inflammatory stranding. Stomach/Bowel: Stomach is within normal limits. Appendix appears normal. No evidence of bowel wall thickening, distention, or inflammatory changes. There is sigmoid and descending colon diverticulosis. Vascular/Lymphatic: Aortic atherosclerosis. No enlarged abdominal or pelvic lymph nodes. Duplicated IVC noted. Reproductive: Prostate gland is enlarged. Other: There is no ascites. There is a small fat containing right inguinal hernia. There is nonspecific presacral edema. Musculoskeletal: The bones are osteopenic. Severe degenerative changes affect the spine. There is mottled appearance of the L3 vertebral body with some subtle linear lucencies present in trace loss of vertebral body height. Paravertebral edema and stranding again noted at this level. Findings are unchanged from prior and compatible with MRI findings of osteomyelitis discitis at L3-L4 with possible neoplastic process of the L3 vertebral body. IMPRESSION: 1. No evidence for metastatic disease in the chest, abdomen or pelvis. 2. Diffuse bladder wall thickening with surrounding inflammatory stranding worrisome for cystitis. 3. Stable abnormal appearance of the L3 which may represent osteomyelitis/discitis versus neoplastic process. 4. Nonspecific presacral edema. 5. Colonic diverticulosis. Aortic Atherosclerosis (ICD10-I70.0). Electronically Signed   By: Ronney Asters M.D.   On: 10/11/2022 17:49   IR Fluoro Guide Ndl Plmt / BX  Result Date: 10/09/2022 INDICATION: Low back pain secondary to osteomyelitis/discitis at L3-L4. EXAM: FLUOROSCOPIC GUIDED CORE BIOPSY AT L3 MEDICATIONS: None. ANESTHESIA/SEDATION: Moderate (conscious) sedation was employed during this procedure. A total of Versed 1 mg and Fentanyl 25 mcg was administered intravenously by the radiology nurse. Total intra-service moderate  Sedation Time: 23 minutes. The patient's level of consciousness and vital signs were monitored continuously by radiology nursing throughout the procedure under my direct supervision. COMPLICATIONS: None immediate. PROCEDURE: Informed written consent was obtained from the patient after a thorough discussion of the procedural risks, benefits and alternatives. All questions were addressed. Maximal Sterile Barrier Technique was utilized including caps, mask, sterile gowns, sterile gloves, sterile  drape, hand hygiene and skin antiseptic. A timeout was performed prior to the initiation of the procedure. Patient was laid prone on the fluoroscopic table. Skin overlying the lumbar region was then prepped and draped in the usual manner. The right pedicle at L3 was then identified, and the skin entry site was then infiltrated with 0.25% bupivacaine and carried to the pedicle itself. Under intermittent biplane fluoroscopy, a 13 gauge Cook spinal needle was then advanced to the posterior 1/3 at L3. Two passes were then made with a 16 gauge core biopsy needle. Biopsy material was then obtained with a 20 mL syringe and sent for analysis as per requesting MD. The 13 gauge Cook spinal needle was removed. Hemostasis was achieved at the skin entry site. Patient tolerated the procedure well. IMPRESSION: Status post fluoroscopic guided core biopsy at L3 as described. Electronically Signed   By: Luanne Bras M.D.   On: 10/09/2022 09:59   MR Lumbar Spine W Wo Contrast  Result Date: 10/06/2022 CLINICAL DATA:  Initial evaluation for low back pain, infection. EXAM: MRI LUMBAR SPINE WITHOUT AND WITH CONTRAST TECHNIQUE: Multiplanar and multiecho pulse sequences of the lumbar spine were obtained without and with intravenous contrast. CONTRAST:  7.10m GADAVIST GADOBUTROL 1 MMOL/ML IV SOLN COMPARISON:  CT from earlier the same day. FINDINGS: Segmentation:  Examination degraded by motion artifact. Standard segmentation. Lowest well-formed  disc space labeled the L5-S1 level. Alignment:  Moderate levoscoliosis.  No significant listhesis. Vertebrae: Abnormal T1 hypointensity, with heterogeneous STIR hyperintensity and enhancement seen within the L3 vertebral body. Edema and enhancement noted within the adjacent L3-4 interspace as well as about the superior endplate of L4. Findings are nonspecific, but most concerning for possible osteomyelitis discitis. Surrounding paraspinous edema and inflammatory changes. No soft tissue collections. Abnormal enhancing material within the ventral epidural space at the level of L3 measures approximately 2.9 x 1.4 cm (series 8, image 19). Findings concerning for epidural phlegmon. 1.6 cm nonenhancing material at the level could reflect a small epidural abscess versus disc protrusion (series 8, image 17). Ventral epidural enhancement extends from the L1-2 through L4-5 interspace. While these findings are favored to be infectious/inflammatory in nature, a possible underlying neoplastic process involving the L3 vertebral body remains difficult to exclude. No other convincing evidence for acute infection elsewhere within the lumbar spine. Reactive endplate changes about the L1-2, L2-3, and L5-S1 interspaces favored to be degenerative. Mild edema and enhancement about the L2-3 and L3-4 facets also favored to be degenerative. Underlying bone marrow signal intensity diffusely heterogeneous. No other worrisome osseous lesions. Conus medullaris and cauda equina: Conus extends to the T12 level. Conus and cauda equina appear normal. Paraspinal and other soft tissues: Paraspinous edema and enhancement adjacent to the L3-4 interspace as above. No collections. Partially visualized urinary bladder is markedly distended with associated bladder wall trabeculation and/or diverticula. Mild bilateral hydroureteronephrosis, suspected to be related to bladder distension. Disc levels: L1-2: Advanced degenerative intervertebral disc space  narrowing with diffuse disc bulge and disc desiccation. Reactive endplate spurring. Moderate right worse than left facet and ligament flavum hypertrophy. No significant spinal stenosis. Foramina remain patent. L2-3: Moderate degenerative intervertebral disc space narrowing with diffuse disc bulge, asymmetric to the left. Associated reactive endplate spurring. 1.6 cm nonenhancing material within the right ventral epidural space could reflect a right subarticular disc protrusion or possibly epidural abscess (series 8, image 17). Moderate facet and ligament flavum hypertrophy. Resultant mild canal with moderate left lateral recess stenosis. Moderate left L2 foraminal narrowing. Right  neural foramen remains patent. L3-4: Advanced intervertebral disc space narrowing with findings concerning for osteomyelitis discitis as above. Probable epidural phlegmon within the ventral epidural space at the level of L3 (series 8, image 19). Moderate left worse than right facet arthrosis. Resultant severe spinal stenosis. Moderate to severe left with mild right L3 foraminal narrowing. L4-5: Degenerative intervertebral disc space narrowing with disc desiccation and diffuse disc bulge. Left-sided reactive endplate spurring. Moderate facet hypertrophy. Resultant mild narrowing of the left lateral recess. Central canal remains patent. Mild to moderate left L4 foraminal narrowing. Right neural foramen remains patent. L5-S1: Degenerative intervertebral disc space narrowing with disc desiccation and diffuse disc bulge. Reactive endplate spurring. Moderate bilateral facet arthrosis. No significant spinal stenosis. Mild bilateral L5 foraminal narrowing. IMPRESSION: 1. Findings concerning for osteomyelitis discitis at the L3-4 level. Abnormal enhancing material within the ventral epidural space at the level of L3, concerning for epidural phlegmon. These changes superimposed on underlying spondylosis and facet arthrosis resultant severe spinal  stenosis. While these changes are favored to reflect infection, a possible neoplastic process involving the L3 vertebral body remains difficult to exclude. Maruyama interval follow-up imaging may be helpful for further evaluation as warranted. 2. Surrounding paraspinous edema and enhancement, but with no drainable soft tissue collection collections. 3. Underlying advanced multilevel degenerative spondylosis and facet arthrosis as detailed above. No other high-grade spinal stenosis 4. Markedly distended urinary bladder with associated bladder wall trabeculation and/or diverticula. Clinical correlation for chronic outlet obstruction recommended. Mild bilateral hydroureteronephrosis, suspected to be related to bladder distension. Electronically Signed   By: Jeannine Boga M.D.   On: 10/06/2022 00:47   CT Lumbar Spine Wo Contrast  Result Date: 10/05/2022 CLINICAL DATA:  Low back pain.  Increased fracture risk. EXAM: CT LUMBAR SPINE WITHOUT CONTRAST TECHNIQUE: Multidetector CT imaging of the lumbar spine was performed without intravenous contrast administration. Multiplanar CT image reconstructions were also generated. RADIATION DOSE REDUCTION: This exam was performed according to the departmental dose-optimization program which includes automated exposure control, adjustment of the mA and/or kV according to patient size and/or use of iterative reconstruction technique. COMPARISON:  Radiography 07/26/2022 FINDINGS: Segmentation: 5 lumbar type vertebral bodies. Alignment: Chronic mild S shaped scoliotic curvature. Vertebrae: Chronic degenerative endplate changes at the L1-2 level. Newly seen lytic destructive pattern throughout the L3 vertebral body, worse towards the left side. Question some involvement of the L2 vertebral body and superior aspect of the L4 vertebral body. Lucent focus within the central portion of the S1 vertebral body. These findings could be due to metastatic disease, infection or both. See  below. Paraspinal and other soft tissues: Distended bladder. Aortic atherosclerosis. Pronounced edematous change in the soft tissues surrounding the L3 vertebral body, worrisome in particular for infection but possibly consistent with tumor as well. Disc levels: T9-10 through T12-L1: Negative L1-2: Chronic disc degeneration with loss of disc height. Endplate osteophytes and mild bulging of the disc. No compressive stenosis. L2-3: Disc space narrowing and vacuum phenomenon. Circumferential bulging of the disc. Facet and ligamentous hypertrophy. Multifactorial stenosis at this level that could cause neural compression. As noted above, there may be some lytic changes of the inferior L2 vertebral body. This raises the possibility of discitis osteomyelitis involving this level. L3-4: Lytic destructive appearance of the L3 vertebral body with loss of height more on the left. Edematous change of the paravertebral soft tissues. Bulging of the disc. Facet and ligamentous hypertrophy. Severe stenosis at this level. Epidural abscess not excluded. As noted above, there could be  lesser destructive change of superior L4. Certainly the findings at this level or worrisome for discitis osteomyelitis. L4-5: Chronic disc degeneration with loss of disc height. Bulging of the disc. Facet and ligamentous hypertrophy. Moderate degenerative type stenosis. L5-S1: Chronic disc degeneration with loss of disc height. Endplate osteophytes and bulging of the disc. Facet degeneration. No canal stenosis. Mild bilateral foraminal stenosis. Lucent area within the S1 vertebral body which is indeterminate. IMPRESSION: 1. Lytic destructive pattern throughout the L3 vertebral body with loss of height more on the left. Question some involvement of the L2 vertebral body and superior aspect of the L4 vertebral body. Pronounced edematous change in the paravertebral soft tissues surrounding the L3 vertebral body, worrisome for infection but possibly  consistent with tumor as well. 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 Severe stenosis at the L2-3 and L3-4 levels, possibly resulting in neurogenic bladder. Epidural abscess is not excluded. Is the patient an MR candidate? 2. Lucent area within the S1 vertebral body which is indeterminate. 3. Chronic degenerative changes at L1-2, L4-5 and L5-S1 as above. Aortic Atherosclerosis (ICD10-I70.0). Electronically Signed   By: Nelson Chimes M.D.   On: 10/05/2022 21:20   DG Chest 1 View  Result Date: 10/05/2022 CLINICAL DATA:  Altered mental status EXAM: CHEST  1 VIEW COMPARISON:  06/02/2021 FINDINGS: Heart is normal size. Mediastinal contours within normal limits. Bibasilar atelectasis. No effusions or pneumothorax. No acute bony abnormality. IMPRESSION: Bibasilar atelectasis. Electronically Signed   By: Rolm Baptise M.D.   On: 10/05/2022 21:13   DG Pelvis 1-2 Views  Result Date: 10/05/2022 CLINICAL DATA:  Left hip and pelvic pain EXAM: PELVIS - 1-2 VIEW COMPARISON:  None Available. FINDINGS: Mild symmetric degenerative changes in the hips with joint space narrowing and spurring. No acute bony abnormality. Specifically, no fracture, subluxation, or dislocation. Degenerative changes in the visualized lower lumbar spine. IMPRESSION: No acute bony abnormality. Electronically Signed   By: Rolm Baptise M.D.   On: 10/05/2022 21:13   CT Head Wo Contrast  Result Date: 10/05/2022 CLINICAL DATA:  Delirium.  Low back pain. EXAM: CT HEAD WITHOUT CONTRAST TECHNIQUE: Contiguous axial images were obtained from the base of the skull through the vertex without intravenous contrast. RADIATION DOSE REDUCTION: This exam was performed according to the departmental dose-optimization program which includes automated exposure control, adjustment of the mA and/or kV according to patient size and/or use of iterative reconstruction technique. COMPARISON:  MRI 02/28/2004 FINDINGS: Brain: Generalized brain volume  loss. Moderate chronic small-vessel ischemic changes of the white matter. No sign of acute infarction, mass lesion, hemorrhage, hydrocephalus or extra-axial collection. Vascular: There is atherosclerotic calcification of the major vessels at the base of the brain. Skull: Negative Sinuses/Orbits: Opacified right maxillary sinus.  Orbits negative. Other: None IMPRESSION: 1. No acute CT finding. Atrophy and chronic small-vessel ischemic changes of the white matter. 2. Opacified right maxillary sinus. Electronically Signed   By: Nelson Chimes M.D.   On: 10/05/2022 21:11    Labs:  Basic Metabolic Panel: Recent Labs  Lab 10/13/22 0428 10/17/22 0611  NA 138 138  K 3.6 3.5  CL 105 103  CO2 23 24  GLUCOSE 100* 90  BUN 17 25*  CREATININE 1.53* 1.54*  CALCIUM 8.1* 8.3*  MG 1.7  --     CBC: Recent Labs  Lab 10/13/22 0428 10/17/22 0611 10/19/22 0532  WBC 10.8* 15.3* 13.7*  NEUTROABS  --  9.3* 8.2*  HGB 11.5* 10.7* 11.3*  HCT  35.4* 32.9* 34.5*  MCV 88.3 87.5 88.9  PLT 306 362 358    CBG: Recent Labs  Lab 10/18/22 1127 10/18/22 1645 10/18/22 2050 10/19/22 0537 10/19/22 1135  GLUCAP 96 330* 218* 84 154*   Family history.  Mother with CAD and diabetes father with CAD.  Denies any colon cancer esophageal cancer or rectal cancer  Brief HPI:   Nicholas Burton is a 89 y.o. right-handed male with history of dementia type 2 diabetes mellitus chronic diastolic congestive heart failure hypertension hyperlipidemia CKD stage III obesity with BMI 25.10.  Per chart review lives with spouse.  He had been needing some assist with ADLs over the past couple of weeks because of low back pain using a rolling walker for mobility.  Patient has a very good family social support.  Wife recently admitted for COPD to the hospital.  Presented 10/05/2022 with altered mental status over the last couple of days as well as increasing low back pain and bilateral lower extremity weakness.  Cranial CT scan showed no acute  changes.  CT/MRI lumbar spine showed findings concerning for osteomyelitis discitis at the L3-4 level with abnormal enhancing material within the ventral epidural space at the level of L3 concerning for epidural phlegmon superimposed on underlying spondylosis and facet arthrosis resulting in severe spinal collection.  Findings favoring to reflect infection possible neoplastic process involving L3 vertebral body.  Incidental noted marked distended urinary bladder with associated bladder wall trabeculation.  CT of the chest abdomen pelvis showed no evidence for metastatic disease in the chest abdomen or pelvis.  Admission chemistries unremarkable except WBC 13,800 potassium 3.3 glucose 215 creatinine 1.35 ammonia level less than 10.  Underwent biopsy which showed metastatic poorly differentiated prostate cancer with oligo metastatic disease to L3.  Palliative care consulted to establish goals of care.  Plan currently is for XRT x 2 weeks per oncology services.  He remained on Decadron therapy.  A Foley tube had been placed for urinary retention plans to remain in place to follow-up with urology services as outpatient.  Therapy evaluations completed due to patient decreased functional mobility was admitted for a comprehensive rehab program.   Hospital Course: Nicholas Burton was admitted to rehab 10/14/2022 for inpatient therapies to consist of PT, ST and OT at least three hours five days a week. Past admission physiatrist, therapy team and rehab RN have worked together to provide customized collaborative inpatient rehab.  Pertaining to patient's metastatic poorly differentiated prostate cancer complicated by acute metabolic encephalopathy.  Patient is followed by oncology services Dr.Kale as well as Dr. Lisbeth Renshaw medical oncology with XRT as advised.  Decadron therapy as advised and will be tapered per medical oncology after radiation therapy completed.  SCDs for DVT prophylaxis.  Low back pain with Lidoderm patch  hydrocodone tramadol as needed.  Chronic diastolic congestive heart failure exhibiting no signs of fluid overload.  AKI on CKD monitoring of input and output with latest creatinine 1.54.  Urinary retention failed voiding trial Foley tube placed maintained on Flomax and patient would receive ambulatory referral for outpatient urology.  Blood sugars monitored hemoglobin A1c of 8 low-dose insulin therapy and his Vania Rea was resumed.  His Glucophage was not resumed due to mild CKD.  Blood pressure controlled on Lopressor 25 mg twice daily.  Bouts of constipation resolved with laxative assistance.   Blood pressures were monitored on TID basis and soft and monitored  Diabetes has been monitored with ac/hs CBG checks and SSI was use prn  for tighter BS control.    Rehab course: During patient's stay in rehab weekly team conferences were held to monitor patient's progress, set goals and discuss barriers to discharge. At admission, patient required minimal guard step pivot transfers minimal guard 200 feet rolling walker  Physical exam.  Blood pressure 132/69 pulse 100 temperature 98.2 respirations 19 oxygen saturation 96% room air Constitutional.  No acute distress HEENT Head.  Normocephalic and atraumatic Eyes.  Pupils round and reactive to light no discharge without nystagmus Neck.  Supple nontender no JVD without thyromegaly Cardiac regular rate and rhythm without any extra sounds or murmur heard Abdomen.  Soft nontender positive bowel sounds without rebound Respiratory effort normal no respiratory distress without wheeze Skin.  No apparent lesions Musculoskeletal Right upper and left upper extremity 5/5 Right lower extremity 5/5 Left lower extremity 3/5 HF 4/5 KE 5/5 DF 5/5 EHL 5/5 PF Neurologic.  Patient is alert very hard of hearing.  Names 3 objects correctly.  Memory recall is 1/3 objects at 5 minutes  He/She  has had improvement in activity tolerance, balance, postural control as well as  ability to compensate for deficits. He/She has had improvement in functional use RUE/LUE  and RLE/LLE as well as improvement in awareness.  Patient able to sit to supine edge of bed supervision.  Contact-guard required for sit to stand transfers rolling walker.  Ambulates 300 feet to the gym with rolling walker contact-guard.  Contact-guard to pivot rolling walker and back up to passenger seat completed with safe reach back to control lower.  Completed all sit to stand with supervision rolling walker.  Working with energy conservation techniques.  He was able to return demonstration amatory transfer to the bathroom sit pivot on tub bench with minimal cues for positioning.  Full family teaching completed plan discharge to home       Disposition: Discharge to home    Diet: Diabetic diet  Special Instructions: No driving smoking or alcohol  Ambulatory referral made to urology services for urinary retention.  Foley tube currently in place.  Medications at discharge. 1.  Decadron 4 mg p.o. daily 2.  Hydrocodone 1 tablet every 6 hours as needed pain 3.  Lantus insulin 12 units nightly 4.  Lidoderm patch as directed 5.  Magnesium gluconate 500 mg p.o. nightly 6.  Melatonin 3 mg p.o. nightly as needed insomnia 7.  Lopressor 25 mg 1/2 tablet p.o. twice daily 8.  MiraLAX daily hold for loose stools 9.  Flomax 0.4 mg p.o. daily after supper 10.  Vitamin D 50,000 units every 7 days 11.  Lipitor 10 mg daily 12.  Vitamin B12 500 mcg p.o. daily 13.  Jardiance 10 mg daily before breakfast 14.  PreserVision 1 capsule twice daily 15.  Demadex 10 mg daily 16.  Lipitor 10 mg p.o. daily 17.  Vitamin B12 500 mcg p.o. daily 18.Colesevelam 3.75 g packet daily 19.Vibegron 75 mg p.o. daily   30-35 minutes were spent completing discharge summary and discharge planning  Discharge Instructions     Ambulatory referral to Urology   Complete by: As directed    Evaluate and treat BPH/urinary retention.         Follow-up Information     Raulkar, Clide Deutscher, MD Follow up.   Specialty: Physical Medicine and Rehabilitation Why: No formal follow-up needed Contact information: [id number] [place] [address] [phone]         Brunetta Genera, MD Follow up.   Specialties:  Hematology, Oncology Why: Call for appointment Contact information: [place] [address] [id number]         Kyung Rudd, MD Follow up.   Specialty: Radiation Oncology Why: Reviewed as directed Contact information: [address] [place] [address] [id number]                 Signed: Lavon Paganini Huntington 10/19/2022, 11:58 AM

## 2022-10-19 NOTE — Progress Notes (Signed)
PROGRESS NOTE   Subjective/Complaints: C/o gas in the evenings- simethicone added Needs outpatient urology follow-up Set d/c date of Saturday Has radiation today  ROS: +low back pain, denies chest pain or shortness of breath, +gas   Objective:   No results found. Recent Labs    10/17/22 0611 10/19/22 0532  WBC 15.3* 13.7*  HGB 10.7* 11.3*  HCT 32.9* 34.5*  PLT 362 358    Recent Labs    10/17/22 0611  NA 138  K 3.5  CL 103  CO2 24  GLUCOSE 90  BUN 25*  CREATININE 1.54*  CALCIUM 8.3*     Intake/Output Summary (Last 24 hours) at 10/19/2022 2033 Last data filed at 10/19/2022 1810 Gross per 24 hour  Intake 840 ml  Output 2450 ml  Net -1610 ml        Physical Exam: Vital Signs Blood pressure 123/61, pulse 81, temperature 98.1 F (36.7 C), temperature source Oral, resp. rate 17, height 5' 9"$  (1.753 m), weight 77.1 kg, SpO2 98 %. Gen: no distress, normal appearing, BMI 25.1 HEENT: oral mucosa pink and moist, NCAT, hard of hearing Cardio: Reg rate Chest: normal effort, normal rate of breathing Abd: soft, non-distended, positive bowel sounds Ext: no edema Psych: pleasant, normal affect GU: Not examined. +Foley, draining clear urine.  Skin: C/D/I. No apparent lesions. MSK:      No apparent deformity.      Strength:                RUE: 5/5 SA, 5/5 EF, 5/5 EE, 5/5 WE, 5/5 FF, 5/5 FA                 LUE: 5/5 SA, 5/5 EF, 5/5 EE, 5/5 WE, 5/5 FF, 5/5 FA                 RLE: 5/5 HF, 5/5 KE, 5/5 DF, 5/5 EHL, 5/5 PF                 LLE:  3/5 HF, 4/5 KE, 5/5 DF, 5/5 EHL, 5/5 PF   Ambulating with RW CG Neurologic exam:  Cognition: AAO to person, place, time and event.  Language: Fluent, No substitutions or neoglisms. No dysarthria. Names 3/3 objects correctly.  Memory: Recalls 1/3 objects at 5 minutes. + significant attention and memory deficits Insight: Poor insight into current condition.  Mood: Pleasant  affect, appropriate mood.  Sensation: To light touch intact in BL UEs and LEs  Reflexes: 2+ in BL UE and LEs. Negative Hoffman's and babinski signs bilaterally.  CN: 2-12 grossly intact.  Coordination: No apparent tremors. No ataxia on FTN, HTS bilaterally.  Spasticity: MAS 0 in all extremities.  GU: foley in place    Assessment/Plan: 1. Functional deficits which require 3+ hours per day of interdisciplinary therapy in a comprehensive inpatient rehab setting. Physiatrist is providing close team supervision and 24 hour management of active medical problems listed below. Physiatrist and rehab team continue to assess barriers to discharge/monitor patient progress toward functional and medical goals  Care Tool:  Bathing    Body parts bathed by patient: Right arm, Left arm, Chest, Abdomen, Front perineal area, Buttocks, Right  upper leg, Left upper leg, Right lower leg, Left lower leg, Face         Bathing assist Assist Level: Minimal Assistance - Patient > 75%     Upper Body Dressing/Undressing Upper body dressing   What is the patient wearing?: Pull over shirt    Upper body assist Assist Level: Minimal Assistance - Patient > 75%    Lower Body Dressing/Undressing Lower body dressing      What is the patient wearing?: Pants, Underwear/pull up     Lower body assist Assist for lower body dressing: Minimal Assistance - Patient > 75%     Toileting Toileting    Toileting assist Assist for toileting: Minimal Assistance - Patient > 75%     Transfers Chair/bed transfer  Transfers assist     Chair/bed transfer assist level: Supervision/Verbal cueing     Locomotion Ambulation   Ambulation assist      Assist level: Supervision/Verbal cueing Assistive device: Walker-rolling Max distance: 150   Walk 10 feet activity   Assist     Assist level: Supervision/Verbal cueing Assistive device: Walker-rolling   Walk 50 feet activity   Assist    Assist level:  Supervision/Verbal cueing Assistive device: Walker-rolling    Walk 150 feet activity   Assist    Assist level: Supervision/Verbal cueing Assistive device: Walker-rolling    Walk 10 feet on uneven surface  activity   Assist Walk 10 feet on uneven surfaces activity did not occur: Safety/medical concerns   Assist level: Contact Guard/Touching assist Assistive device: Walker-rolling   Wheelchair     Assist Is the patient using a wheelchair?: No Type of Wheelchair: Manual Wheelchair activity did not occur: Refused         Wheelchair 50 feet with 2 turns activity    Assist    Wheelchair 50 feet with 2 turns activity did not occur: Refused       Wheelchair 150 feet activity     Assist  Wheelchair 150 feet activity did not occur: Refused       Blood pressure 123/61, pulse 81, temperature 98.1 F (36.7 C), temperature source Oral, resp. rate 17, height 5' 9"$  (1.753 m), weight 77.1 kg, SpO2 98 %.    Medical Problem List and Plan: 1. Functional deficits secondary to metastatic poorly differentiated prostate cancer complicated by acute metabolic encephalopathy.  Follow-up oncology Dr.Kale as well as medical oncology Dr. Lisbeth Renshaw for plan XRT.  Continue Decadron therapy 4 mg daily for now             -patient may shower             -ELOS/Goals: 7-10 days  Continue CIR  Discussed that sons are building ramp 2.  Antithrombotics: -DVT/anticoagulation:  Mechanical: Antiembolism stockings, thigh (TED hose) Bilateral lower extremities             -antiplatelet therapy: N/A 3.Low back pain: kpad ordered. Lidoderm patch as directed, hydrocodone/tramadol as needed 4. Mood/Behavior/Sleep: Melatonin nightly as needed             -antipsychotic agents: N/A 5. Neuropsych/cognition: This patient is capable of making decisions on his own behalf. 6. Skin/Wound Care: Routine skin checks 7. Fluids/Electrolytes/Nutrition: Routine in and outs with follow-up chemistries 8.   Chronic diastolic congestive heart failure.  Monitor for any signs of fluid overload 9.  AKI on CKD/urinary retention.  Foley tube in place for now after failed voiding trial.  Follow-up outpatient urology services.  Continue Flomax 0.4 mg daily  -  2/19 creatinine appears stable at 1.54, BUN 25.  Appears around 3b, Continue to monitor 10.  Diabetes mellitus.  Hemoglobin A1c 8.0.  decrease NovoLog to 1 units 3 times daily, changed Semglee to 11U BID.  Check blood sugars before meals and at bedtime Increase semglee back to home dose to 20U  CBG (last 3)  Recent Labs    10/18/22 2050 10/19/22 0537 10/19/22 1135  GLUCAP 218* 84 154*      11.  Hypertension.  Decrease Lopressor to 12.5 mg twice daily.  Monitor with increased mobility. Increase magnesium gluconate to 536mg HS 12.  Constipation.  MiraLAX daily. Add magnesium gluconate 2566mHS  -2/19 improved, last BM yesterday 13. Urinary retention. Reinserted foley 2/17, maintain until outpatient uro follow-up for comfort. Discussed higher risk of infection with foley in place. 14. History of vitamin D insufficiency: start ergocalciferol 50,000U once per week for 7 weels 15.  Cerumen buildup -Debrox ordered 16.  Elevated liver enzymes, mildly elevated ALT and ALK phosphatase  -Avoid hepatotoxic medications, he does not appear to be using Tylenol very frequently, will d/c tylenol 17. Gas: simethicone ordered  LOS: 5 days A FACE TO FACE EVALUATION WAS PERFORMED  KrIzora Ribas/21/2024, 8:33 PM

## 2022-10-19 NOTE — Progress Notes (Signed)
Physical Therapy Session Note  Patient Details  Name: Nicholas Burton MRN: [id number] Date of Birth: 10-20-1927  Today's Date: 10/19/2022 PT Individual Time: [phone] and 1331-1411 PT Individual Time Calculation (min): 71 min and 40 (min)  [place] Term Goals: Week 1:  PT [place] Term Goal 1 (Week 1): STG=LTG d/t ELOS  Skilled Therapeutic Interventions/Progress Updates:   Treatment 1(AM): Patient received in recliner with family at bedside. Pt required set up assist to position RW for transfer. Pt demonstrated good recall for hand placement with power up from recliner to RW with no cues needed, ambulated ~ 150' to main gym with seated rest break required due to fatigue and Lt hip pain, pt reports 7/10. Stair training completed with bil hand rails and single rail on Lt for multiple bouts to ascend/descend 8x 3" steps. Patient completed 8 stairs with Min assist and cues for sequencing; pt performed first 2 bouts with forward alternating step pattern with 2 rails. On 3rd bout educated pt on side step technique with bil UE support on Lt hand rail. Mod assist required with repeated verbal/tactile cuing for safe hand placement on single rail.  Pt demonstrated decreased planning for foot placement on step with side step technique, however this is likely impacted by vision. Seated rest provided between each bout on stairs.   Bed mobility completed with flat bed and pt able to completed stand pivot transfer WC<>bed with supervision and was able to complete sit<>supine in bed with extra time only. Pt dependently rolled to gym in [facility] to conserve energy for therapeutic exercises in gym.  Pt completed stand pivot transfer WC<>mat table for seated exercises with supervision. Assist needed to manage [facility] for safety throughout all transfers to ensure brakes locked prior to sit/stand and to manage leg rests and foley.   Therapeutic Exercise: patient able to complete seated exercises with supervision and intermittent cues  for technique. Standing stair taps completed with support of RW and CGA for safety, pt able to raise bil feet to step however poor control to tap 6" step and rather hard tap.  - LAQ: 2x10 bil LE - chest press: 5lbs bar, 2x 10 bil UE - shoulder press (flexion): 5lbs bar, 2x 10 bil UE - bicep curls: 5lbs bar, 2x 10 bil UE - toe raises (seated): 1x15 - stair taps 6": standing with RW support, 2x10 taps bil LE  Discussed discharge planning with pt/family as pt is progressing towards LTG's well. Family reports they have all DME needed for pt's safe return home (WC, RW, tub bench). Family planning to install ramp for home entry however unlikely to be ready for discharge. Pt needs further training with stair mobility and curb negotiation.  EOS pt dependently rolled to room for time constraints. Stand pivot transfer WC<>bed completed with supervision and pt demonstrating safe sequencing. Pt able to raise bil LE's and lower trunk to move sit>supine in bed, pt repositioned hips/LE's in supine for comfort with extra time and effort, Mod I level. Pt left in bed, call bell within reach, bed alarm on, and family at bedside.    Treatment 2(PM): Session focused on family training. Pt received in bed and able to complete supine>sit with extra time only. Pt's son provided set up assist for sit<>stand transfer using RW and to manage catheter. Pt ambulated 150' to gym with CGA/supervision provided by pt's son, no LOB noted and pt maintained safe position to walker with occasional verbal cues from pt's son and therapist to step closer. Stair  training completed with pt's son providing guarding/assist for sequencing and balance. Pt ascended/descended with side step pattern and Lt hand rail; pt with overall improved foot placement to allow room for both feet on step compared to AM session. Pt's son provided min assist for balance and cues for hand placement with CGA from therapist for safety. Pt ambulated [place] distance  with CGA from son and RW back to [facility] after stair training for rest break and dependently rolled to ortho gym for car transfer training. Pt's son assisted with stand pivot transfer WC>car providing CGA and assist for foley management. Pt performed car transfer with safe reach back and able to use car frame to control trunk while raising bil LE's into/out of car. Seated rest provided sitting up at edge of car. Gait performed on uneven surface to ascend/descend incline of ramp 2x with CGA from pt's son and min cues on first bout from therapist. Following functional mobility pt completed repeat sit<>stands from mat table with emphasis on hand placement for power up and reach back for controlled lowering as pt has been inconsistent from various surfaces but is consistent from [place]. Pt fatigued and required cues for pursed lip breathing during recovery as he reported slight SOB after exercises. Pt dependently rolled back to room in [facility] due to fatigue and pt's son assisted with Stand pivot transfer WC>bed with CGA and pt able to return to supine without assist. EOS pt left in bed with call bell within reach, alarm on, and family at bedside.    Therapy Documentation Precautions:  Precautions Precautions: Fall Precaution Comments: no brace yet, scheduled to begin radiation tx on Monday 2/19 Restrictions Weight Bearing Restrictions: No     Verner Mould, DPT Acute Rehabilitation Services Office [phone]  10/19/22 7:15 AM

## 2022-10-19 NOTE — Progress Notes (Signed)
Occupational Therapy Session Note  Patient Details  Name: Nicholas Burton MRN: [id number] Date of Birth: 24-Apr-1928  Today's Date: 10/19/2022 OT Individual Time: 1047-1130 OT Individual Time Calculation (min): 43 min    [place] Term Goals: Week 1:  OT [place] Term Goal 1 (Week 1): STG=LTG d/t ELOS  Skilled Therapeutic Interventions/Progress Updates:  Pt greeted supine in bed, pt agreeable to OT intervention. Pt completed supine>sit with supervision. Pt completed functional ambulation to gym with RW and CGA. Pt completed below therapeutic activities to challenge dynamic reaching and BUE strength.       Pt able to stand to reach dynamically to mirror to remove squigz from mirror with an emphasis on dynamic reaching with no UE support to simulate reaching during ADLs.  Pt with visual deficits but able to see the contrast on mirror from [place]. Pt completed task with no UE support or LOB.   Pt also able to stand to complete BUE therex with 1 lb weighted ball with pt completing below therex: X10 OH presses X10 bicep curls X10 chest presses  Pt completed therex with no UE support with CGA.   Attempted to work on sit>stand with no UE support with pt unable to complete task. Pt did work on targeted stepping to corresponding colored dots on floor to challenge dynamic gait, pt completed task with BUE support from RW with CGA and no LOB.   Pt completed functional ambulation back to room with RW and CGA.        Ended session with pt supine in bed with all needs within reach and bed alarm activated.                    Therapy Documentation Precautions:  Precautions Precautions: Fall Precaution Comments: active prostate/lumbar cancer with radiation treatments Restrictions Weight Bearing Restrictions: No  Pain: Unrated pain reported in back, rest breaks provided as needed.    Therapy/Group: Individual Therapy  Precious Haws 10/19/2022, 12:17 PM

## 2022-10-20 ENCOUNTER — Other Ambulatory Visit: Payer: Self-pay

## 2022-10-20 ENCOUNTER — Ambulatory Visit
Admit: 2022-10-20 | Discharge: 2022-10-20 | Disposition: A | Discharge status: Home / Self Care | Payer: Medicare Other | Attending: Radiation Oncology | Admitting: Radiation Oncology

## 2022-10-20 ENCOUNTER — Inpatient Hospital Stay: Payer: Medicare Other | Attending: Hematology | Admitting: Hematology

## 2022-10-20 DIAGNOSIS — C7951 Secondary malignant neoplasm of bone: Secondary | ICD-10-CM | POA: Diagnosis not present

## 2022-10-20 DIAGNOSIS — C61 Malignant neoplasm of prostate: Secondary | ICD-10-CM | POA: Diagnosis not present

## 2022-10-20 DIAGNOSIS — Z510 Encounter for antineoplastic radiation therapy: Secondary | ICD-10-CM | POA: Diagnosis not present

## 2022-10-20 LAB — RAD ONC ARIA SESSION SUMMARY
Course Elapsed Days: 3
Plan Fractions Treated to Date: 4
Plan Prescribed Dose Per Fraction: 3 Gy
Plan Total Fractions Prescribed: 10
Plan Total Prescribed Dose: 30 Gy
Reference Point Dosage Given to Date: 12 Gy
Reference Point Session Dosage Given: 3 Gy
Session Number: 4

## 2022-10-20 LAB — GLUCOSE, CAPILLARY
Glucose-Capillary: 304 mg/dL — ABNORMAL HIGH (ref 70–99)
Glucose-Capillary: 111 mg/dL — ABNORMAL HIGH (ref 70–99)
Glucose-Capillary: 258 mg/dL — ABNORMAL HIGH (ref 70–99)
Glucose-Capillary: 250 mg/dL — ABNORMAL HIGH (ref 70–99)
Glucose-Capillary: 302 mg/dL — ABNORMAL HIGH (ref 70–99)

## 2022-10-20 MED ORDER — INSULIN GLARGINE-YFGN 100 UNIT/ML ~~LOC~~ SOLN
12.0000 [IU] | Freq: Two times a day (BID) | SUBCUTANEOUS | Status: DC
  Administered 2022-10-20 – 2022-10-21 (×2): 12 [IU] via SUBCUTANEOUS
  Filled 2022-10-20 (×3): qty 0.12

## 2022-10-20 MED ORDER — POLYETHYLENE GLYCOL 3350 17 G PO PACK: 17.0000 g | PACK | Freq: Every day | ORAL | Status: DC | PRN

## 2022-10-20 MED ORDER — METOPROLOL SUCCINATE ER 25 MG PO TB24
12.5000 mg | ORAL_TABLET | Freq: Every day | ORAL | Status: DC
  Administered 2022-10-20 – 2022-10-21 (×2): 12.5 mg via ORAL
  Filled 2022-10-20 (×2): qty 1

## 2022-10-20 NOTE — Progress Notes (Addendum)
Patient ID: Nicholas Burton, male   DOB: [birth year], 89 y.o.   MRN: [id number]  [facility] referral sent to [facility]. Patient has all DME.  Orders sent

## 2022-10-20 NOTE — Progress Notes (Signed)
Occupational Therapy Session Note  Patient Details  Name: Nicholas Burton MRN: [id number] Date of Birth: 03-Feb-1928  Today's Date: 10/20/2022 OT Individual Time: [phone] OT Individual Time Calculation (min): 30 min    [name] Term Goals: Week 1:  OT [name] Term Goal 1 (Week 1): STG=LTG d/t ELOS  Skilled Therapeutic Interventions/Progress Updates:    Session 1: Pt and son present. Pt "squirly" this morning not wanting to complete tx, wanting to rest d/t not getting good sleep. Compromise with 30  min rest before coming back to complete part of session. Will make up as able per POC  Pt received in bed with L hip pain urated. Rest and repositiong provided for pain relief  ADL: Pt completes ADL at overall supervision Level. Skilled interventions include: cuing for RW management throughout threshold negotiation in bathroom as well as encouragement to complete oral care in standing/hand hygiene in standing. Increased ant/post sway with no hands on RW. Pt declines oral care just wanting gum   Therapeutic activity Pt completes obstacle course weaving through cones, stepping over 1 inch objects, and onto/off of compliant surface to improve functional mobility and dynamic balnace. Pt completes course 2 x with CGA-Sup A and min cuing.   Pt left at end of session in bed with exit alarm on, call light in reach and all needs met   Therapy Documentation Precautions:  Precautions Precautions: Fall Precaution Comments: active prostate/lumbar cancer with radiation treatments Restrictions Weight Bearing Restrictions: No General:   Vital Signs:   Therapy/Group: Individual Therapy  [name] 10/20/2022, 6:47 AM

## 2022-10-20 NOTE — Progress Notes (Signed)
Occupational Therapy Session Note  Patient Details  Name: Nicholas Burton MRN: [id number] Date of Birth: 1928-02-19  Today's Date: 10/20/2022 OT Individual Time: [id number] OT Individual Time Calculation (min): 65 min    [place] Term Goals: Week 1:  OT [place] Term Goal 1 (Week 1): STG=LTG d/t ELOS Week 2:     Skilled Therapeutic Interventions/Progress Updates:    1:1 Pt received in bed - Pt able to get to EOB mod I. Self care retraining with focus on functional mobility, endurance training; standing balance with and without RW. Pt ambulated from bed to room door and able to open the door managing the RW safety with supervision. PT able to propel the w/c with bilateral legs (for strengthening) with supervision with rest breaks as needed. Pt then ambulated from the RN station all the way to the ADL apartment (over ~200 feet) with supervision. Pt transferred on/off low couch with contact guard with extra time. Pt then ambulated into the bathroom and able to transfer into and out tub with tub bench with supervision with extra time to lift Les over threshold- this is hte setup at his home.  Ambulated another ~ 50 feet into the ortho gym and sat in chair at the [place]. Pt able to perform on 1 resistance level for 5 min and 30 sec with 2 rest breaks. Pt then ambulated into the gym and practiced side stepping through a series of 12 cones while managing the RW with supervision. Two bout of practice of walking ~50 feet without RW for UE support with min A. Returned to bed with supervision and back into supine with fam member present.   Therapy Documentation Precautions:  Precautions Precautions: Fall Precaution Comments: active prostate/lumbar cancer with radiation treatments Restrictions Weight Bearing Restrictions: No    Pain:  Ongoing discomfort in right side of back down to knee - ongoing; allow for rest breaks as needed   Therapy/Group: Individual Therapy  Willeen Cass [facility] 10/20/2022, 10:50  AM

## 2022-10-20 NOTE — Progress Notes (Signed)
Physical Therapy Session Note  Patient Details  Name: Nicholas Burton MRN: [id number] Date of Birth: 1928/04/09  Today's Date: 10/20/2022 PT Individual Time: 0816-0915 PT Individual Time Calculation (min): 59 min   [place] Term Goals: Week 1:  PT [place] Term Goal 1 (Week 1): STG=LTG d/t ELOS  Skilled Therapeutic Interventions/Progress Updates:  Pt received in bed with family at bedside, pt using aquathermia pad for lower back pain, rates 4/10 today at start and throughout session today. Pt able to complete supine>sit EOB with HOB slightly elevated and extra time. Pt requires assist to set up for sit<>stand with RW for assist with foley and to ensure WC is locked throughout session. Pt ambulated ~120' to the dayroom with RW and CGA/supervision. 5 minutes completed on Nustep with LE's only, level 3, pt unable to keep feet on foot plates and theraband used to anchor heel to plate for stability. Pt reported urge for BM and therapist adjusted Nustep for set up with sit>stand transfer to RW, pt ambulated ~50' to bathroom and able to manage LB clothing for toileting, pt with good safety awareness/planning with use of grab bar to control lowering and rise from toilet. Pt ambulated ~150' to main gym with CGA/supervision and no cues needed for walker position this bout. Curb training completed with RW to simulate home doorway entrance as pt has no rails. Forward step to pattern completed with cues for "up with good/Rt, and down with bad/Lt". Min assist on first 4 bouts up and down curb. Final 2 bouts pt able to recall sequence with CGA to and steadying touch as pt managed placing RW up/sown curb. Seated rest provided in [facility] between bouts of curb negotiation for energy conservation. Patient dependently rolled back to room for time management and completed sit<>stand from [facility] to RW to ambulate Heitz bout in room to recliner, CGA/supervision assist. EOS pt resting in recliner, alarm on, call bell within reach, and family at  bedside. He will continue to benefit from skilled PT interventions to progress towards LTG's.     Therapy/Group: Individual Therapy   Verner Mould, DPT Acute Rehabilitation Services Office [phone]  10/20/22 7:32 AM

## 2022-10-20 NOTE — Progress Notes (Signed)
PROGRESS NOTE   Subjective/Complaints: No new complaints this morning Has radiation today, discussed with son asking about radiation to prostate as well Will make sure he has outpatient urology follow-up  ROS: +low back pain, denies chest pain or shortness of breath, +gas   Objective:   No results found. Recent Labs    10/19/22 0532  WBC 13.7*  HGB 11.3*  HCT 34.5*  PLT 358    No results for input(s): "NA", "K", "CL", "CO2", "GLUCOSE", "BUN", "CREATININE", "CALCIUM" in the last 72 hours.    Intake/Output Summary (Last 24 hours) at 10/20/2022 1034 Last data filed at 10/20/2022 0600 Gross per 24 hour  Intake 600 ml  Output 1775 ml  Net -1175 ml        Physical Exam: Vital Signs Blood pressure 123/61, pulse 81, temperature 98.1 F (36.7 C), temperature source Oral, resp. rate 17, height 5' 9"$  (1.753 m), weight 77.1 kg, SpO2 98 %. Gen: no distress, normal appearing, BMI 25.1 HEENT: oral mucosa pink and moist, NCAT, hard of hearing Cardio: Reg rate Chest: normal effort, normal rate of breathing Abd: soft, non-distended, positive bowel sounds Ext: no edema Psych: pleasant, normal affect GU: Not examined. +Foley, draining clear urine.  Skin: C/D/I. No apparent lesions. MSK:      No apparent deformity.      Strength:                RUE: 5/5 SA, 5/5 EF, 5/5 EE, 5/5 WE, 5/5 FF, 5/5 FA                 LUE: 5/5 SA, 5/5 EF, 5/5 EE, 5/5 WE, 5/5 FF, 5/5 FA                 RLE: 5/5 HF, 5/5 KE, 5/5 DF, 5/5 EHL, 5/5 PF                 LLE:  3/5 HF, 4/5 KE, 5/5 DF, 5/5 EHL, 5/5 PF   Ambulating with RW CG Neurologic exam:  Cognition: AAO to person, place, time and event.  Language: Fluent, No substitutions or neoglisms. No dysarthria. Names 3/3 objects correctly.  Memory: Recalls 1/3 objects at 5 minutes. + significant attention and memory deficits Insight: Poor insight into current condition.  Mood: Pleasant affect,  appropriate mood.  Sensation: To light touch intact in BL UEs and LEs  Reflexes: 2+ in BL UE and LEs. Negative Hoffman's and babinski signs bilaterally.  CN: 2-12 grossly intact.  Coordination: No apparent tremors. No ataxia on FTN, HTS bilaterally.  Spasticity: MAS 0 in all extremities.  GU: foley in place, draining yellow urine    Assessment/Plan: 1. Functional deficits which require 3+ hours per day of interdisciplinary therapy in a comprehensive inpatient rehab setting. Physiatrist is providing close team supervision and 24 hour management of active medical problems listed below. Physiatrist and rehab team continue to assess barriers to discharge/monitor patient progress toward functional and medical goals  Care Tool:  Bathing    Body parts bathed by patient: Right arm, Left arm, Chest, Abdomen, Front perineal area, Buttocks, Right upper leg, Left upper leg, Right lower leg, Left lower leg,  Face         Bathing assist Assist Level: Minimal Assistance - Patient > 75%     Upper Body Dressing/Undressing Upper body dressing   What is the patient wearing?: Pull over shirt    Upper body assist Assist Level: Minimal Assistance - Patient > 75%    Lower Body Dressing/Undressing Lower body dressing      What is the patient wearing?: Pants, Underwear/pull up     Lower body assist Assist for lower body dressing: Minimal Assistance - Patient > 75%     Toileting Toileting    Toileting assist Assist for toileting: Minimal Assistance - Patient > 75%     Transfers Chair/bed transfer  Transfers assist     Chair/bed transfer assist level: Supervision/Verbal cueing     Locomotion Ambulation   Ambulation assist      Assist level: Supervision/Verbal cueing Assistive device: Walker-rolling Max distance: 150   Walk 10 feet activity   Assist     Assist level: Supervision/Verbal cueing Assistive device: Walker-rolling   Walk 50 feet activity   Assist     Assist level: Supervision/Verbal cueing Assistive device: Walker-rolling    Walk 150 feet activity   Assist    Assist level: Supervision/Verbal cueing Assistive device: Walker-rolling    Walk 10 feet on uneven surface  activity   Assist Walk 10 feet on uneven surfaces activity did not occur: Safety/medical concerns   Assist level: Contact Guard/Touching assist Assistive device: Walker-rolling   Wheelchair     Assist Is the patient using a wheelchair?: No Type of Wheelchair: Manual Wheelchair activity did not occur: Refused         Wheelchair 50 feet with 2 turns activity    Assist    Wheelchair 50 feet with 2 turns activity did not occur: Refused       Wheelchair 150 feet activity     Assist  Wheelchair 150 feet activity did not occur: Refused       Blood pressure 123/61, pulse 81, temperature 98.1 F (36.7 C), temperature source Oral, resp. rate 17, height 5' 9"$  (1.753 m), weight 77.1 kg, SpO2 98 %.    Medical Problem List and Plan: 1. Functional deficits secondary to metastatic poorly differentiated prostate cancer complicated by acute metabolic encephalopathy.  Follow-up oncology Dr.Kale as well as medical oncology Dr. Lisbeth Renshaw for plan XRT.  Continue Decadron therapy 4 mg daily for now             -patient may shower             -ELOS/Goals: 7-10 days  Continue CIR  Discussed that sons are building ramp 2.  Antithrombotics: -DVT/anticoagulation:  Mechanical: Antiembolism stockings, thigh (TED hose) Bilateral lower extremities             -antiplatelet therapy: N/A 3.Low back pain: kpad ordered. Lidoderm patch as directed, hydrocodone/tramadol as needed 4. Mood/Behavior/Sleep: Melatonin nightly as needed             -antipsychotic agents: N/A 5. Neuropsych/cognition: This patient is capable of making decisions on his own behalf. 6. Skin/Wound Care: Routine skin checks 7. Fluids/Electrolytes/Nutrition: Routine in and outs with follow-up  chemistries 8.  Chronic diastolic congestive heart failure.  Monitor for any signs of fluid overload 9.  AKI on CKD/urinary retention.  Foley tube in place for now after failed voiding trial.  Follow-up outpatient urology services.  Continue Flomax 0.4 mg daily  -2/19 creatinine appears stable at 1.54, BUN 25.  Appears  around 3b, Continue to monitor 10.  Diabetes mellitus.  Hemoglobin A1c 8.0.  decrease NovoLog to 1 units 3 times daily, increase Semglee to 12UBID.  Check blood sugars before meals and at bedtime Increase semglee back to home dose to 20U  CBG (last 3)  Recent Labs    10/19/22 1630 10/19/22 2044 10/20/22 0615  GLUCAP 304* 322* 111*      11.  Hypertension. Decrease Toprol XL to 12.62m HS.  Monitor with increased mobility. Increase magnesium gluconate to 5059m HS 12.  Constipation.  MiraLAX changed to daily prn. Increase magnesium gluconate to 50012mS.  13. Urinary retention. Reinserted foley 2/17, maintain until outpatient uro follow-up for comfort. Discussed higher risk of infection with foley in place. 14. History of vitamin D insufficiency: start ergocalciferol 50,000U once per week for 7 weels 15.  Cerumen buildup -Debrox ordered 16.  Elevated liver enzymes, mildly elevated ALT and ALK phosphatase  -Avoid hepatotoxic medications, he does not appear to be using Tylenol very frequently, will d/c tylenol 17. Gas: simethicone ordered  LOS: 6 days A FACE TO FACE EVALUATION WAS PERFORMED  KruClide Deutscherulkar 10/20/2022, 10:34 AM

## 2022-10-20 NOTE — Progress Notes (Signed)
Patient ID: Nicholas Burton, male   DOB: 1928-06-26, 89 y.o.   MRN: [place]:[id number]  [facility] packet at [place] for 11:45 AM pick up.

## 2022-10-20 NOTE — Progress Notes (Signed)
Physical Therapy Discharge Summary  Patient Details  Name: Nicholas Burton MRN: [place]:[id number] Date of Birth: 1928/08/06  Date of Discharge from PT service:October 21, 2022  Patient has met 8 of 9 long term goals due to improved activity tolerance, improved balance, increased strength, improved awareness, and improved coordination. Patient to discharge at an ambulatory level Supervision using RW. Patient's care partner is independent to provide the necessary physical and cognitive assistance at discharge. Pt's sons attended multiple family education sessions and verbalized and demonstrated confidence with ability to perform tasks to ensure safe discharge home.   Reasons goals not met: Pt did not meet stair goal of supervision as pt currently requires CGA/min A to navigate 4 steps with 1 handrail due to weakness/deconditioning and decreased activity tolerance.   Recommendation:  Patient will benefit from ongoing skilled PT services in home health setting to continue to advance safe functional mobility, address ongoing impairments in transfers, generalized strengthening and endurance, dynamic standing balance/coordination, gait training, and to minimize fall risk.  Equipment: No equipment provided - pt already has RW  Reasons for discharge: treatment goals met  Patient/family agrees with progress made and goals achieved: Yes  PT Discharge Precautions/Restrictions Precautions Precautions: Fall Precaution Comments: active prostate/lumbar cancer with radiation treatments Restrictions Weight Bearing Restrictions: No Pain Interference Pain Interference Pain Effect on Sleep: 2. Occasionally Pain Interference with Therapy Activities: 2. Occasionally Pain Interference with Day-to-Day Activities: 2. Occasionally Cognition Overall Cognitive Status: History of cognitive impairments - at baseline Arousal/Alertness: Awake/alert Orientation Level: Oriented to person;Oriented to place;Oriented to  situation Memory: Impaired Awareness: Appears intact Problem Solving: Impaired Safety/Judgment: Impaired Sensation Sensation Light Touch: Appears Intact Hot/Cold: Appears Intact Proprioception: Appears Intact Coordination Gross Motor Movements are Fluid and Coordinated: No Fine Motor Movements are Fluid and Coordinated: No Coordination and Movement Description: generalized weakness/deconditioning and pain Finger Nose Finger Test: vision challenges coordination but pt follow cues well and able to complete task Heel Shin Test: limited by dementia and vision to follow commands/demonstration for test Motor  Motor Motor: Within Functional Limits Motor - Skilled Clinical Observations: age-related generalized weakness/deconditioning and pain; moves slowly overall  Mobility Bed Mobility Bed Mobility: Rolling Right;Rolling Left;Supine to Sit;Sit to Supine Rolling Right: Independent with assistive device Rolling Left: Independent with assistive device Supine to Sit: Independent with assistive device Sit to Supine: Independent with assistive device Transfers Transfers: Sit to Stand;Stand to Sit;Stand Pivot Transfers Sit to Stand: Independent with assistive device Stand to Sit: Independent with assistive device Stand Pivot Transfers: Independent with assistive device Transfer (Assistive device): Rolling walker Locomotion  Gait Ambulation: Yes Gait Assistance: Supervision/Verbal cueing Gait Distance (Feet): 200 Feet Assistive device: Rolling walker Gait Assistance Details: Verbal cues for safe use of DME/AE Gait Assistance Details: verbal cues for RW safety Gait Gait: Yes Gait Pattern: Impaired Gait Pattern: Decreased stride length;Decreased hip/knee flexion - left;Decreased dorsiflexion - right;Trunk flexed;Poor foot clearance - left;Poor foot clearance - right;Antalgic;Decreased step length - right;Decreased step length - left Gait velocity: Decreased Stairs / Additional  Locomotion Stairs: Yes Stairs Assistance: Contact Guard/Touching assist Stair Management Technique: One rail Left Number of Stairs: 4 Height of Stairs: 6 Ramp: Contact Guard/touching assist (RW) Curb: Contact Guard/Touching assist (RW) Pick up small object from the floor assist level: Supervision/Verbal cueing Pick up small object from the floor assistive device: Naval architect Wheelchair Mobility: No  Trunk/Postural Assessment  Cervical Assessment Cervical Assessment: Exceptions to [facility] (forward head) Thoracic Assessment Thoracic Assessment: Exceptions to [facility] (thoracic rounding) Lumbar  Assessment Lumbar Assessment: Exceptions to [facility] (decreased lordosis with neutral to posterior pelvic tilt most likely d/t pain) Postural Control Postural Control: Within Functional Limits  Balance Balance Balance Assessed: Yes Static Sitting Balance Static Sitting - Balance Support: Feet supported;No upper extremity supported Static Sitting - Level of Assistance: 6: Modified independent (Device/Increase time) Dynamic Sitting Balance Dynamic Sitting - Balance Support: Feet supported;No upper extremity supported Dynamic Sitting - Level of Assistance: 5: Stand by assistance (supervision) Static Standing Balance Static Standing - Balance Support: Bilateral upper extremity supported;During functional activity (RW) Static Standing - Level of Assistance: 6: Modified independent (Device/Increase time) Dynamic Standing Balance Dynamic Standing - Balance Support: Bilateral upper extremity supported;During functional activity (RW) Dynamic Standing - Level of Assistance: 6: Modified independent (Device/Increase time) Dynamic Standing - Comments: with transfers only Extremity Assessment  RLE Assessment RLE Assessment: Exceptions to [facility] General Strength Comments: grossly 4/ 5 LLE Assessment LLE Assessment: Exceptions to [facility] General Strength Comments: Lt hip flex/knee ext 3+/5 and  limited by pain; 4-/5 through remainder of extremity   Alfonse Alpers PT, DPT  10/20/2022, 3:11 PM

## 2022-10-21 ENCOUNTER — Ambulatory Visit
Admit: 2022-10-21 | Discharge: 2022-10-21 | Disposition: A | Discharge status: Home / Self Care | Payer: Medicare Other | Attending: Radiation Oncology | Admitting: Radiation Oncology

## 2022-10-21 ENCOUNTER — Other Ambulatory Visit: Payer: Self-pay

## 2022-10-21 ENCOUNTER — Ambulatory Visit: Payer: Medicare Other

## 2022-10-21 ENCOUNTER — Other Ambulatory Visit (HOSPITAL_COMMUNITY): Payer: Self-pay

## 2022-10-21 DIAGNOSIS — C7951 Secondary malignant neoplasm of bone: Secondary | ICD-10-CM | POA: Diagnosis not present

## 2022-10-21 DIAGNOSIS — C61 Malignant neoplasm of prostate: Secondary | ICD-10-CM | POA: Diagnosis not present

## 2022-10-21 DIAGNOSIS — Z510 Encounter for antineoplastic radiation therapy: Secondary | ICD-10-CM | POA: Diagnosis not present

## 2022-10-21 LAB — RAD ONC ARIA SESSION SUMMARY
Course Elapsed Days: 4
Plan Fractions Treated to Date: 5
Plan Prescribed Dose Per Fraction: 3 Gy
Plan Total Fractions Prescribed: 10
Plan Total Prescribed Dose: 30 Gy
Reference Point Dosage Given to Date: 15 Gy
Reference Point Session Dosage Given: 3 Gy
Session Number: 5

## 2022-10-21 LAB — GLUCOSE, CAPILLARY
Glucose-Capillary: 73 mg/dL (ref 70–99)
Glucose-Capillary: 188 mg/dL — ABNORMAL HIGH (ref 70–99)
Glucose-Capillary: 264 mg/dL — ABNORMAL HIGH (ref 70–99)
Glucose-Capillary: 314 mg/dL — ABNORMAL HIGH (ref 70–99)

## 2022-10-21 MED ORDER — TAMSULOSIN HCL 0.4 MG PO CAPS
0.4000 mg | ORAL_CAPSULE | Freq: Every day | ORAL | 0 refills | Status: DC
  Filled 2022-10-21: qty 30, 30d supply, fill #0

## 2022-10-21 MED ORDER — HYDROCODONE-ACETAMINOPHEN 5-325 MG PO TABS: 1.0000 | ORAL_TABLET | ORAL | 0 refills | Status: DC | PRN

## 2022-10-21 MED ORDER — VITAMIN D (ERGOCALCIFEROL) 1.25 MG (50000 UNIT) PO CAPS
50000.0000 [IU] | ORAL_CAPSULE | ORAL | 0 refills | Status: DC
  Filled 2022-10-21: qty 5, 35d supply, fill #0

## 2022-10-21 MED ORDER — TORSEMIDE 10 MG PO TABS: 10.0000 mg | ORAL_TABLET | Freq: Every day | ORAL | 0 refills | Status: DC

## 2022-10-21 MED ORDER — METOPROLOL TARTRATE 25 MG PO TABS: ORAL_TABLET | ORAL | 0 refills | Status: DC

## 2022-10-21 MED ORDER — MAGNESIUM GLUCONATE 500 MG PO TABS
500.0000 mg | ORAL_TABLET | Freq: Every day | ORAL | 0 refills | Status: DC
  Filled 2022-10-21: qty 30, 30d supply, fill #0

## 2022-10-21 MED ORDER — DEXAMETHASONE 4 MG PO TABS
4.0000 mg | ORAL_TABLET | Freq: Every day | ORAL | 0 refills | Status: DC
  Filled 2022-10-21: qty 30, 30d supply, fill #0

## 2022-10-21 MED ORDER — MAGNESIUM GLUCONATE 500 MG PO TABS
500.0000 mg | ORAL_TABLET | Freq: Every day | ORAL | 0 refills | Status: AC
Start: 2022-10-21 — End: ?

## 2022-10-21 MED ORDER — CYANOCOBALAMIN 500 MCG PO TABS
500.0000 ug | ORAL_TABLET | Freq: Every day | ORAL | 0 refills | Status: DC
  Filled 2022-10-21: qty 30, 30d supply, fill #0

## 2022-10-21 MED ORDER — VITAMIN D (ERGOCALCIFEROL) 1.25 MG (50000 UNIT) PO CAPS: 50000.0000 [IU] | ORAL_CAPSULE | ORAL | 0 refills | Status: DC

## 2022-10-21 MED ORDER — ATORVASTATIN CALCIUM 10 MG PO TABS
10.0000 mg | ORAL_TABLET | Freq: Every day | ORAL | 0 refills | Status: DC
  Filled 2022-10-21: qty 30, 30d supply, fill #0

## 2022-10-21 MED ORDER — ATORVASTATIN CALCIUM 10 MG PO TABS: 10.0000 mg | ORAL_TABLET | Freq: Every day | ORAL | 0 refills | Status: DC

## 2022-10-21 MED ORDER — INSULIN GLARGINE-YFGN 100 UNIT/ML ~~LOC~~ SOLN
13.0000 [IU] | Freq: Two times a day (BID) | SUBCUTANEOUS | Status: DC
  Administered 2022-10-21 – 2022-10-22 (×2): 13 [IU] via SUBCUTANEOUS
  Filled 2022-10-21 (×3): qty 0.13

## 2022-10-21 MED ORDER — TORSEMIDE 10 MG PO TABS
10.0000 mg | ORAL_TABLET | Freq: Every day | ORAL | 0 refills | Status: DC
  Filled 2022-10-21: qty 30, 30d supply, fill #0

## 2022-10-21 MED ORDER — LIDOCAINE 5 % EX PTCH: 1.0000 | MEDICATED_PATCH | CUTANEOUS | 0 refills | Status: DC

## 2022-10-21 MED ORDER — HYDROCODONE-ACETAMINOPHEN 5-325 MG PO TABS
1.0000 | ORAL_TABLET | ORAL | 0 refills | Status: DC | PRN
  Filled 2022-10-21: qty 30, 3d supply, fill #0

## 2022-10-21 MED ORDER — EMPAGLIFLOZIN 10 MG PO TABS: 10.0000 mg | ORAL_TABLET | Freq: Every day | ORAL | 0 refills | Status: DC

## 2022-10-21 MED ORDER — VITAMIN D 1000 UNITS PO TABS
1000.0000 [IU] | ORAL_TABLET | Freq: Every day | ORAL | 0 refills | Status: DC
  Filled 2022-10-21: qty 30, 30d supply, fill #0

## 2022-10-21 MED ORDER — INSULIN GLARGINE 100 UNIT/ML SOLOSTAR PEN
12.0000 [IU] | PEN_INJECTOR | Freq: Two times a day (BID) | SUBCUTANEOUS | 11 refills | Status: DC
  Filled 2022-10-21: qty 15, 63d supply, fill #0

## 2022-10-21 MED ORDER — INSULIN GLARGINE 100 UNIT/ML SOLOSTAR PEN: 12.0000 [IU] | PEN_INJECTOR | Freq: Two times a day (BID) | SUBCUTANEOUS | 11 refills | Status: DC

## 2022-10-21 MED ORDER — VITAMIN D 1000 UNITS PO TABS: 1000.0000 [IU] | ORAL_TABLET | Freq: Every day | ORAL | 0 refills | Status: DC

## 2022-10-21 MED ORDER — EMPAGLIFLOZIN 10 MG PO TABS
10.0000 mg | ORAL_TABLET | Freq: Every day | ORAL | 0 refills | Status: DC
  Filled 2022-10-21: qty 30, 30d supply, fill #0

## 2022-10-21 MED ORDER — ACETAMINOPHEN 325 MG PO TABS: 325.0000 mg | ORAL_TABLET | Freq: Four times a day (QID) | ORAL | Status: AC | PRN

## 2022-10-21 MED ORDER — CYANOCOBALAMIN 500 MCG PO TABS: 500.0000 ug | ORAL_TABLET | Freq: Every day | ORAL | 0 refills | Status: AC

## 2022-10-21 MED ORDER — LIDOCAINE 5 % EX PTCH
1.0000 | MEDICATED_PATCH | CUTANEOUS | 0 refills | Status: DC
  Filled 2022-10-21: qty 30, 30d supply, fill #0

## 2022-10-21 MED ORDER — TAMSULOSIN HCL 0.4 MG PO CAPS: 0.4000 mg | ORAL_CAPSULE | Freq: Every day | ORAL | 0 refills | Status: DC

## 2022-10-21 MED ORDER — DEXAMETHASONE 4 MG PO TABS: 4.0000 mg | ORAL_TABLET | Freq: Every day | ORAL | 0 refills | Status: DC

## 2022-10-21 MED ORDER — MELATONIN 3 MG PO TABS
3.0000 mg | ORAL_TABLET | Freq: Every evening | ORAL | 0 refills | Status: DC | PRN
  Filled 2022-10-21: qty 30, 30d supply, fill #0

## 2022-10-21 MED ORDER — MELATONIN 3 MG PO TABS: 3.0000 mg | ORAL_TABLET | Freq: Every evening | ORAL | 0 refills | Status: DC | PRN

## 2022-10-21 MED ORDER — METOPROLOL TARTRATE 25 MG PO TABS
ORAL_TABLET | ORAL | 0 refills | Status: DC
  Filled 2022-10-21: qty 60, fill #0

## 2022-10-21 MED ORDER — METOPROLOL SUCCINATE ER 25 MG PO TB24
12.5000 mg | ORAL_TABLET | Freq: Every day | ORAL | 0 refills | Status: DC
  Filled 2022-10-21: qty 30, 60d supply, fill #0

## 2022-10-21 NOTE — Progress Notes (Signed)
Patient ID: Nicholas Burton, male   DOB: 1927/08/30, 89 y.o.   MRN: [place]:[id number]  SW spoke with patient sons to address questions before d/c. PA will review discharge instructions with family today. No additional questions or concerns. NO DME needs. HH established with [facility].

## 2022-10-21 NOTE — Plan of Care (Signed)
  Problem: RH Balance Goal: LTG: Patient will maintain dynamic sitting balance (OT) Description: LTG:  Patient will maintain dynamic sitting balance with assistance during activities of daily living (OT) Outcome: Completed/Met Goal: LTG Patient will maintain dynamic standing with ADLs (OT) Description: LTG:  Patient will maintain dynamic standing balance with assist during activities of daily living (OT)  Outcome: Completed/Met   Problem: RH Grooming Goal: LTG Patient will perform grooming w/assist,cues/equip (OT) Description: LTG: Patient will perform grooming with assist, with/without cues using equipment (OT) Outcome: Completed/Met   Problem: RH Bathing Goal: LTG Patient will bathe all body parts with assist levels (OT) Description: LTG: Patient will bathe all body parts with assist levels (OT) Outcome: Completed/Met   Problem: RH Dressing Goal: LTG Patient will perform upper body dressing (OT) Description: LTG Patient will perform upper body dressing with assist, with/without cues (OT). Outcome: Completed/Met Goal: LTG Patient will perform lower body dressing w/assist (OT) Description: LTG: Patient will perform lower body dressing with assist, with/without cues in positioning using equipment (OT) Outcome: Completed/Met   Problem: RH Toileting Goal: LTG Patient will perform toileting task (3/3 steps) with assistance level (OT) Description: LTG: Patient will perform toileting task (3/3 steps) with assistance level (OT)  Outcome: Completed/Met   Problem: RH Toilet Transfers Goal: LTG Patient will perform toilet transfers w/assist (OT) Description: LTG: Patient will perform toilet transfers with assist, with/without cues using equipment (OT) Outcome: Completed/Met   Problem: RH Tub/Shower Transfers Goal: LTG Patient will perform tub/shower transfers w/assist (OT) Description: LTG: Patient will perform tub/shower transfers with assist, with/without cues using equipment  (OT) Outcome: Completed/Met   

## 2022-10-21 NOTE — Progress Notes (Signed)
Inpatient Rehabilitation Discharge Medication Review by a Pharmacist  A complete drug regimen review was completed for this patient to identify any potential clinically significant medication issues.  High Risk Drug Classes Is patient taking? Indication by Medication  Antipsychotic No   Anticoagulant No   Antibiotic No   Opioid Yes Vicodin - prn pain  Antiplatelet No   Hypoglycemics/insulin Yes Jardiance, lantus - DM  Vasoactive Medication Yes Metoprolol - BP Torsemide - Fluid Tamsulosin - BPH  Chemotherapy Yes, Oral Chemotherapy Gemtesa - prostate cancer  Other Yes Atorvastatin, Colesevelam- HLD Dexamethasone - prostate cancer Vitamin D - supplement     Type of Medication Issue Identified Description of Issue Recommendation(s)  Drug Interaction(s) (clinically significant)     Duplicate Therapy     Allergy     No Medication Administration End Date     Incorrect Dose     Additional Drug Therapy Needed     Significant med changes from prior encounter (inform family/care partners about these prior to discharge).    Other       Clinically significant medication issues were identified that warrant physician communication and completion of prescribed/recommended actions by midnight of the next day:  No  Pharmacist comments: None  Time spent performing this drug regimen review (minutes): 20 minutes  Thank you Anette Guarneri, PharmD

## 2022-10-21 NOTE — Progress Notes (Signed)
Inpatient Rehabilitation Care Coordinator Discharge Note   Patient Details  Name: Nicholas Burton MRN: [place]:[id number] Date of Birth: 11/02/1927   Discharge location: Home with sons and grandchildren to assist  Length of Stay: 8 Days  Discharge activity level: Supervision/Cga  Home/community participation: Sons, live in granddaughter and other grandchildren  Patient response [id number] Literacy - How often do you need to have someone help you when you read instructions, pamphlets, or other written material from your doctor or pharmacy?: Always  Patient response [id number] Isolation - How often do you feel lonely or isolated from those around you?: Never  Services provided included: MD, RD, PT, OT, SLP, CM, RN, TR, Pharmacy, Neuropsych, SW  Financial Services:  Financial Services Utilized: Medicare    Choices offered to/list presented to: Son and patient  Follow-up services arranged:  Home Health, DME Home Health Agency: [facility] (previously established)    DME : Patient has all DME reccomended    Patient response to transportation need: Is the patient able to respond to transportation needs?: Yes In the past 12 months, has lack of transportation kept you from medical appointments or from getting medications?: No In the past 12 months, has lack of transportation kept you from meetings, work, or from getting things needed for daily living?: No    Comments (or additional information):  Patient/Family verbalized understanding of follow-up arrangements:  Yes  Individual responsible for coordination of the follow-up plan: Jimy, son   [phone]  Confirmed correct DME delivered: Dyanne Iha 10/21/2022    Dyanne Iha

## 2022-10-21 NOTE — Progress Notes (Signed)
Patient ID: Nicholas Burton, male   DOB: 04-18-1928, 89 y.o.   MRN: [place]:[id number]  [facility] packet at nursing station for 1:45 PM pick up.

## 2022-10-21 NOTE — Progress Notes (Signed)
Occupational Therapy Session Note  Patient Details  Name: Nicholas Burton MRN: [place]:[id number] Date of Birth: May 09, 1928  Today's Date: 10/21/2022 OT Individual Time: [id number] & 1105-1200 OT Individual Time Calculation (min): 55 min & 55 min   [place] Term Goals: Week 1:  OT [place] Term Goal 1 (Week 1): STG=LTG d/t ELOS  Skilled Therapeutic Interventions/Progress Updates:  Session 1 Skilled OT intervention completed with focus on ADL retraining, functional endurance and safety within a shower context. Pt received seated on commode with sons present, pt agreeable to session. No pain reported.  Pt requesting to shower. Completed all sit > stands and ambulatory transfers using RW with supervision. Ambulated to EOB to remove clothing with min A needed provided by sons, then ambulated to tub bench in shower with cues provided for positioning and catheter management. Pt able to recall needing to doff hearing aids prior to shower.  Able to bathe all at the supervision/set up A level including stance using grab bar for standing balance. Issued pt a long handled sponge for prevention of back pain with washing BLE.   Able to dry off with supervision. Assist only needed for LB with threading catheter through LB clothing though pt planning to DC with smaller catheter attached to leg and won't need this assist. Introduced sock aid for prevention of bending forward and associated pain in low back however pt able to donn socks/shoes by scooting back on EOB and at this time is preferred. Set up A for UB dressing/deo. Min A needed for hearing aids due to low vision.  Pt transitioned to further up in bed with supervision, then supervision bed mobility to upright in bed. Pt remained in bed, heating pad on low back, bed alarm on/activated, and with all needs in reach at end of session.  Session 2 Skilled OT intervention completed with focus on functional transfers, toileting needs, standing balance and tolerance. Pt  received upright in bed, agreeable to session. 7/10 pain reported in lumbar; pre-medicated. OT offered rest breaks, repositioning and distraction throughout for pain reduction.  Completed bed mobility with mod I, sit > stand and ambulatory transfer with supervision using RW to w/c. Transported dependently in w/c for pain management > gym. Then urgent report of need for BM, therefore transport back to room and ambulatory transfer with supervision using RW toilet. Able to manage all toileting needs without assist as pt stated "I only had gas, no poop."  Transported back to gym, supervision stand pivot with RW to EOM, the completed the following: -placement/retrieval of squigz from long mirror at high/low levels with supervision for balance using RW, however on 2nd trial upgraded task to pt standing on airex pad then requiring CGA to recover 2 posterior LOB. Seated rest breaks needed for back pain and general fatigue from earlier therapies -Seated EOM- completed the following exercises with HEP issued for continued strengthening at home: (With yellow theraband) 10 reps Horizontal abduction Self-anchored shoulder flexion each arm Self-anchored bicep flexion each arm Self-anchored tricep extension each arm Therapist anchored scapular retraction each arm Shoulder external rotation  Pt declined ambulating back to room due to fatigue/pain, therefore transported dependently. Pt was left seated in w/c with sons present clear to assist pt to bed in prep for transport to radiation treatment. All needs met at OT departure.  Therapy Documentation Precautions:  Precautions Precautions: Fall Precaution Comments: active prostate/lumbar cancer with radiation treatments Restrictions Weight Bearing Restrictions: No   Therapy/Group: Individual Therapy  [name], MS, OTR/L  10/21/2022, 12:08 PM

## 2022-10-21 NOTE — Progress Notes (Signed)
PROGRESS NOTE   Subjective/Complaints: Discussed plan for d/c tomorrow, can leave as soon as ready since has been stable Discussed increase in long acting insulin and d/c of standing novolog  ROS: +low back pain, denies chest pain or shortness of breath, +gas- improved   Objective:   No results found. Recent Labs    10/19/22 0532  WBC 13.7*  HGB 11.3*  HCT 34.5*  PLT 358    No results for input(s): "NA", "K", "CL", "CO2", "GLUCOSE", "BUN", "CREATININE", "CALCIUM" in the last 72 hours.    Intake/Output Summary (Last 24 hours) at 10/21/2022 1425 Last data filed at 10/21/2022 0756 Gross per 24 hour  Intake 600 ml  Output --  Net 600 ml        Physical Exam: Vital Signs Blood pressure 126/68, pulse 79, temperature 97.7 F (36.5 C), resp. rate 15, height '5\' 9"'$  (1.753 m), weight 77.1 kg, SpO2 100 %. Gen: no distress, normal appearing, BMI 25.1 HEENT: oral mucosa pink and moist, NCAT, hard of hearing Cardio: Reg rate Chest: normal effort, normal rate of breathing Abd: soft, non-distended, positive bowel sounds Ext: no edema Psych: pleasant, normal affect GU: Not examined. +Foley, draining clear urine.  Skin: C/D/I. No apparent lesions. MSK:      No apparent deformity. Transferring with supervision      Strength:                RUE: 5/5 SA, 5/5 EF, 5/5 EE, 5/5 WE, 5/5 FF, 5/5 FA                 LUE: 5/5 SA, 5/5 EF, 5/5 EE, 5/5 WE, 5/5 FF, 5/5 FA                 RLE: 5/5 HF, 5/5 KE, 5/5 DF, 5/5 EHL, 5/5 PF                 LLE:  3/5 HF, 4/5 KE, 5/5 DF, 5/5 EHL, 5/5 PF   Ambulating with RW CG Neurologic exam:  Cognition: AAO to person, place, time and event.  Language: Fluent, No substitutions or neoglisms. No dysarthria. Names 3/3 objects correctly.  Memory: Recalls 1/3 objects at 5 minutes. + significant attention and memory deficits Insight: Poor insight into current condition.  Mood: Pleasant affect,  appropriate mood.  Sensation: To light touch intact in BL UEs and LEs  Reflexes: 2+ in BL UE and LEs. Negative Hoffman's and babinski signs bilaterally.  CN: 2-12 grossly intact.  Coordination: No apparent tremors. No ataxia on FTN, HTS bilaterally.  Spasticity: MAS 0 in all extremities.  GU: foley in place, draining yellow urine    Assessment/Plan: 1. Functional deficits which require 3+ hours per day of interdisciplinary therapy in a comprehensive inpatient rehab setting. Physiatrist is providing close team supervision and 24 hour management of active medical problems listed below. Physiatrist and rehab team continue to assess barriers to discharge/monitor patient progress toward functional and medical goals  Care Tool:  Bathing    Body parts bathed by patient: Right arm, Left arm, Chest, Abdomen, Front perineal area, Buttocks, Right upper leg, Left upper leg, Right lower leg, Left lower leg,  Face         Bathing assist Assist Level: Supervision/Verbal cueing     Upper Body Dressing/Undressing Upper body dressing   What is the patient wearing?: Pull over shirt    Upper body assist Assist Level: Set up assist    Lower Body Dressing/Undressing Lower body dressing      What is the patient wearing?: Pants, Underwear/pull up     Lower body assist Assist for lower body dressing: Supervision/Verbal cueing     Toileting Toileting    Toileting assist Assist for toileting: Supervision/Verbal cueing     Transfers Chair/bed transfer  Transfers assist     Chair/bed transfer assist level: Independent with assistive device Chair/bed transfer assistive device: Museum/gallery exhibitions officer assist      Assist level: Supervision/Verbal cueing Assistive device: Walker-rolling Max distance: 221f   Walk 10 feet activity   Assist     Assist level: Supervision/Verbal cueing Assistive device: Walker-rolling   Walk 50 feet activity   Assist     Assist level: Supervision/Verbal cueing Assistive device: Walker-rolling    Walk 150 feet activity   Assist    Assist level: Supervision/Verbal cueing Assistive device: Walker-rolling    Walk 10 feet on uneven surface  activity   Assist Walk 10 feet on uneven surfaces activity did not occur: Safety/medical concerns   Assist level: Supervision/Verbal cueing (RW) Assistive device: Walker-rolling   Wheelchair     Assist Is the patient using a wheelchair?: No Type of Wheelchair: Manual Wheelchair activity did not occur: N/A         Wheelchair 50 feet with 2 turns activity    Assist    Wheelchair 50 feet with 2 turns activity did not occur: N/A       Wheelchair 150 feet activity     Assist  Wheelchair 150 feet activity did not occur: N/A       Blood pressure 126/68, pulse 79, temperature 97.7 F (36.5 C), resp. rate 15, height '5\' 9"'$  (1.753 m), weight 77.1 kg, SpO2 100 %.    Medical Problem List and Plan: 1. Functional deficits secondary to metastatic poorly differentiated prostate cancer complicated by acute metabolic encephalopathy.  Follow-up oncology Dr.Kale as well as medical oncology Dr. MLisbeth Renshawfor plan XRT.  Continue Decadron therapy 4 mg daily for now             -patient may shower             -ELOS/Goals: 7-10 days  Continue CIR  Discussed that sons are building ramp  Discussed plan for d/c tomorrow 2.  Antithrombotics: -DVT/anticoagulation:  Mechanical: Antiembolism stockings, thigh (TED hose) Bilateral lower extremities             -antiplatelet therapy: N/A 3.Low back pain: kpad ordered. Lidoderm patch as directed, hydrocodone/tramadol as needed 4. Mood/Behavior/Sleep: Melatonin nightly as needed             -antipsychotic agents: N/A 5. Neuropsych/cognition: This patient is capable of making decisions on his own behalf. 6. Skin/Wound Care: Routine skin checks 7. Fluids/Electrolytes/Nutrition: Routine in and outs with follow-up  chemistries 8.  Chronic diastolic congestive heart failure.  Monitor for any signs of fluid overload 9.  AKI on CKD/urinary retention.  Foley tube in place for now after failed voiding trial.  Follow-up outpatient urology services. Continue Flomax 0.4 mg daily to aid in outpatient voiding trial  -2/19 creatinine appears stable at 1.54, BUN 25.  Appears around 3b,  Continue to monitor 10.  Diabetes mellitus.  Hemoglobin A1c 8.0.  d/c scheduled novolog, increase Semglee to 13UBID.  Check blood sugars before meals and at bedtime  CBG (last 3)  Recent Labs    10/20/22 2040 10/21/22 0612 10/21/22 1206  GLUCAP 302* 73 188*      11.  Hypertension. Decrease Toprol XL to 12.'5mg'$  HS. Continue this dose. Monitor with increased mobility. Increase magnesium gluconate to 513mg HS 12.  Constipation.  MiraLAX changed to daily prn. Increase magnesium gluconate to '500mg'$  HS.  13. Urinary retention. Reinserted foley 2/17, maintain until outpatient uro follow-up for comfort. Discussed higher risk of infection with foley in place. 14. History of vitamin D insufficiency: start ergocalciferol 50,000U once per week for 7 weels 15.  Cerumen buildup -Debrox ordered 16.  Elevated liver enzymes, mildly elevated ALT and ALK phosphatase  -Avoid hepatotoxic medications, he does not appear to be using Tylenol very frequently, will d/c tylenol 17. Gas: simethicone ordered  LOS: 7 days A FACE TO FACE EVALUATION WAS PERFORMED  KMartha ClanP Cortlin Marano 10/21/2022, 2:25 PM

## 2022-10-21 NOTE — Progress Notes (Signed)
Physical Therapy Session Note  Patient Details  Name: Nicholas Burton MRN: [id number] Date of Birth: [birth year]  Today's Date: 10/21/2022 PT Individual Time: 0930-1043 PT Individual Time Calculation (min): 73 min   Nicholas Burton Term Goals: Week 1:  PT Cu Term Goal 1 (Week 1): STG=LTG d/t ELOS  Skilled Therapeutic Interventions/Progress Updates:   Received pt semi-reclined in bed with RN administering medications. Pt agreeable to PT treatment and reported chronic low back and leg pain throughout session - repositioning done to reduce pain levels. Session with emphasis on family education training, functional mobility/transfers, generalized strengthening and endurance, dynamic standing balance/coordination, stair navigation, simulated car transfers, and gait training. Pt performed bed mobility mod I x 2 trials throughout session using bed features and with increased time. Pt performed all transfers with RW and mod I throughout session. Pt ambulated 115f using RW and supervision to main therapy Burton and navigated 4x2 6in steps with 1 handrail and CGA/min A provided by Nicholas Serenewith BUE support on 1 railing and using a lateral stepping technique. Pt required CGA when navigating steps with L handrail and min A when navigating steps with R handrail as RLE is stronger than LLE and pt experienced 1 episode of buckling when leading with LLE - provided cues for hand placement, body mechanics, and technique for Nicholas Burton with stair navigation. Pt ambulated 1553fwith RW and supervision provided by Nicholas Burton and performed ambulatory simulated car transfer with RW and supervision using UEs to assist LEs into car and ambulated 1063fn uneven surfaces (ramp) with RW and close supervision provided by Nicholas Burton then [facility]   Pt able to stand and pick up beanbag from floor using reacher and supervision. Pt then ambulated around dayroom and practiced picking up beanbags from  floor using reacher and close supervision provided by son Nicholas Burton emphasis on balance and ambulating while holding onto RW and reacher - son's plan on getting bag to hold reacher in. Worked on dynamic sitting balance, core strength, and UE strength batting beachball with 2lb dowel 2x20 reps to fatigue. Transferred into WC and performed the following seated exercises with emphasis on LE strength/ROM -LAQ 2x12 bilaterally with 2lb ankle weight on RLE and .75lb on LLE -hip flexion 2x12 bilaterally with 2lb ankle weight on RLE and 0.75lb ankle weight on LLE Pt ambulated 150f40fth RW and supervision back to room and returned to bed. Concluded session with pt semi-reclined in bed, needs within reach, and bed alarm on.   Therapy Documentation Precautions:  Precautions Precautions: Fall Precaution Comments: active prostate/lumbar cancer with radiation treatments Restrictions Weight Bearing Restrictions: No   Therapy/Group: Individual Therapy Nicholas Burton DPT  10/21/2022, 7:15 AM

## 2022-10-22 DIAGNOSIS — C61 Malignant neoplasm of prostate: Secondary | ICD-10-CM | POA: Diagnosis not present

## 2022-10-22 LAB — GLUCOSE, CAPILLARY: Glucose-Capillary: 87 mg/dL (ref 70–99)

## 2022-10-22 NOTE — Progress Notes (Signed)
Patient discharge this shift by staff with family by side.  Foley catheter intact and draining clear yellow urine.Patient and family voices understanding of discharge instructions

## 2022-10-22 NOTE — Progress Notes (Signed)
PROGRESS NOTE   Subjective/Complaints:  No acute complaints.  No events overnight.  Had previously discussed discharge medications with staff, has no questions regarding discharge home.  Advised to call the unit if any concerns, questions after discharge.  Son is concerned regarding the style of Foley bag they are being discharged with, but otherwise has no concerns.  ROS: +low back pain, denies chest pain or shortness of breath, +gas- improved   Objective:   No results found. No results for input(s): "WBC", "HGB", "HCT", "PLT" in the last 72 hours.   No results for input(s): "NA", "K", "CL", "CO2", "GLUCOSE", "BUN", "CREATININE", "CALCIUM" in the last 72 hours.    Intake/Output Summary (Last 24 hours) at 10/22/2022 1513 Last data filed at 10/22/2022 0855 Gross per 24 hour  Intake --  Output 2626 ml  Net -2626 ml         Physical Exam: Vital Signs Blood pressure 139/74, pulse 75, temperature 97.8 F (36.6 C), resp. rate 18, height '5\' 9"'$  (1.753 m), weight 77.1 kg, SpO2 98 %. Gen: no distress, normal appearing, BMI 25.1 HEENT: oral mucosa pink and moist, NCAT, hard of hearing Cardio: Reg rate Chest: normal effort, normal rate of breathing Abd: soft, non-distended, positive bowel sounds Ext: no edema Psych: pleasant, normal affect GU: Not examined. +Foley, draining clear urine.  Skin: C/D/I. No apparent lesions. MSK:      No apparent deformity. Transferring with supervision      Strength:                RUE: 5/5 SA, 5/5 EF, 5/5 EE, 5/5 WE, 5/5 FF, 5/5 FA                 LUE: 5/5 SA, 5/5 EF, 5/5 EE, 5/5 WE, 5/5 FF, 5/5 FA                 RLE: 5/5 HF, 5/5 KE, 5/5 DF, 5/5 EHL, 5/5 PF                 LLE:  3/5 HF, 4/5 KE, 5/5 DF, 5/5 EHL, 5/5 PF   Ambulating with RW CG Neurologic exam:  Cognition: AAO to person, place, time and event.  Language: Fluent, No substitutions or neoglisms. No dysarthria. Names 3/3  objects correctly.  Memory: Recalls 1/3 objects at 5 minutes. + significant attention and memory deficits Insight: Poor insight into current condition.  Mood: Pleasant affect, appropriate mood.  Sensation: To light touch intact in BL UEs and LEs  Reflexes: 2+ in BL UE and LEs. Negative Hoffman's and babinski signs bilaterally.  CN: 2-12 grossly intact.  Coordination: No apparent tremors. No ataxia on FTN, HTS bilaterally.  Spasticity: MAS 0 in all extremities.  GU: foley in place, draining yellow urine    Assessment/Plan: 1. Functional deficits which require 3+ hours per day of interdisciplinary therapy in a comprehensive inpatient rehab setting. Physiatrist is providing close team supervision and 24 hour management of active medical problems listed below. Physiatrist and rehab team continue to assess barriers to discharge/monitor patient progress toward functional and medical goals  Care Tool:  Bathing    Body parts bathed by patient:  Right arm, Left arm, Chest, Abdomen, Front perineal area, Buttocks, Right upper leg, Left upper leg, Right lower leg, Left lower leg, Face         Bathing assist Assist Level: Supervision/Verbal cueing     Upper Body Dressing/Undressing Upper body dressing   What is the patient wearing?: Pull over shirt    Upper body assist Assist Level: Set up assist    Lower Body Dressing/Undressing Lower body dressing      What is the patient wearing?: Pants, Underwear/pull up     Lower body assist Assist for lower body dressing: Supervision/Verbal cueing     Toileting Toileting    Toileting assist Assist for toileting: Supervision/Verbal cueing     Transfers Chair/bed transfer  Transfers assist     Chair/bed transfer assist level: Independent with assistive device Chair/bed transfer assistive device: Museum/gallery exhibitions officer assist      Assist level: Supervision/Verbal cueing Assistive device:  Walker-rolling Max distance: 260f   Walk 10 feet activity   Assist     Assist level: Supervision/Verbal cueing Assistive device: Walker-rolling   Walk 50 feet activity   Assist    Assist level: Supervision/Verbal cueing Assistive device: Walker-rolling    Walk 150 feet activity   Assist    Assist level: Supervision/Verbal cueing Assistive device: Walker-rolling    Walk 10 feet on uneven surface  activity   Assist Walk 10 feet on uneven surfaces activity did not occur: Safety/medical concerns   Assist level: Supervision/Verbal cueing (RW) Assistive device: Walker-rolling   Wheelchair     Assist Is the patient using a wheelchair?: No Type of Wheelchair: Manual Wheelchair activity did not occur: N/A         Wheelchair 50 feet with 2 turns activity    Assist    Wheelchair 50 feet with 2 turns activity did not occur: N/A       Wheelchair 150 feet activity     Assist  Wheelchair 150 feet activity did not occur: N/A       Blood pressure 139/74, pulse 75, temperature 97.8 F (36.6 C), resp. rate 18, height '5\' 9"'$  (1.753 m), weight 77.1 kg, SpO2 98 %.    Medical Problem List and Plan: 1. Functional deficits secondary to metastatic poorly differentiated prostate cancer complicated by acute metabolic encephalopathy.  Follow-up oncology Dr.Kale as well as medical oncology Dr. MLisbeth Renshawfor plan XRT.  Continue Decadron therapy 4 mg daily for now             -patient may shower             -ELOS/Goals: 7-10 days  Continue CIR  Discussed that sons are building ramp  Stable for discharge to home 2.  Antithrombotics: -DVT/anticoagulation:  Mechanical: Antiembolism stockings, thigh (TED hose) Bilateral lower extremities             -antiplatelet therapy: N/A 3.Low back pain: kpad ordered. Lidoderm patch as directed, hydrocodone/tramadol as needed 4. Mood/Behavior/Sleep: Melatonin nightly as needed             -antipsychotic agents: N/A 5.  Neuropsych/cognition: This patient is capable of making decisions on his own behalf. 6. Skin/Wound Care: Routine skin checks 7. Fluids/Electrolytes/Nutrition: Routine in and outs with follow-up chemistries 8.  Chronic diastolic congestive heart failure.  Monitor for any signs of fluid overload 9.  AKI on CKD/urinary retention.  Foley tube in place for now after failed voiding trial.  Follow-up outpatient urology services. Continue Flomax  0.4 mg daily to aid in outpatient voiding trial  -2/19 creatinine appears stable at 1.54, BUN 25.  Appears around 3b, Continue to monitor 10.  Diabetes mellitus.  Hemoglobin A1c 8.0.  d/c scheduled novolog, increase Semglee to 13UBID.  Check blood sugars before meals and at bedtime  CBG (last 3)  Recent Labs    10/21/22 1653 10/21/22 2010 10/22/22 0629  GLUCAP 264* 314* 87       11.  Hypertension. Decrease Toprol XL to 12.'5mg'$  HS. Continue this dose. Monitor with increased mobility. Increase magnesium gluconate to 598mg HS 12.  Constipation.  MiraLAX changed to daily prn. Increase magnesium gluconate to '500mg'$  HS.  13. Urinary retention. Reinserted foley 2/17, maintain until outpatient uro follow-up for comfort. Discussed higher risk of infection with foley in place. 14. History of vitamin D insufficiency: start ergocalciferol 50,000U once per week for 7 weels 15.  Cerumen buildup -Debrox ordered 16.  Elevated liver enzymes, mildly elevated ALT and ALK phosphatase  -Avoid hepatotoxic medications, he does not appear to be using Tylenol very frequently, will d/c tylenol 17. Gas: simethicone ordered  LOS: 8 days A FACE TO FACE EVALUATION WAS PERFORMED  MGertie Gowda2/24/2024, 3:13 PM

## 2022-10-24 ENCOUNTER — Encounter: Payer: Self-pay | Admitting: Radiation Oncology

## 2022-10-24 ENCOUNTER — Ambulatory Visit
Admission: RE | Admit: 2022-10-24 | Discharge: 2022-10-24 | Disposition: A | Discharge status: Home / Self Care | Payer: Medicare Other | Source: Ambulatory Visit | Attending: Radiation Oncology | Admitting: Radiation Oncology

## 2022-10-24 ENCOUNTER — Other Ambulatory Visit: Payer: Self-pay

## 2022-10-24 ENCOUNTER — Encounter: Payer: Self-pay | Admitting: Interventional Cardiology

## 2022-10-24 ENCOUNTER — Telehealth: Payer: Self-pay | Admitting: Internal Medicine

## 2022-10-24 DIAGNOSIS — J441 Chronic obstructive pulmonary disease with (acute) exacerbation: Secondary | ICD-10-CM | POA: Diagnosis not present

## 2022-10-24 DIAGNOSIS — N179 Acute kidney failure, unspecified: Secondary | ICD-10-CM | POA: Diagnosis not present

## 2022-10-24 DIAGNOSIS — Z7984 Long term (current) use of oral hypoglycemic drugs: Secondary | ICD-10-CM | POA: Diagnosis not present

## 2022-10-24 DIAGNOSIS — J4489 Other specified chronic obstructive pulmonary disease: Secondary | ICD-10-CM | POA: Diagnosis not present

## 2022-10-24 DIAGNOSIS — I5042 Chronic combined systolic (congestive) and diastolic (congestive) heart failure: Secondary | ICD-10-CM | POA: Diagnosis not present

## 2022-10-24 DIAGNOSIS — Z6824 Body mass index (BMI) 24.0-24.9, adult: Secondary | ICD-10-CM | POA: Diagnosis not present

## 2022-10-24 DIAGNOSIS — I130 Hypertensive heart and chronic kidney disease with heart failure and stage 1 through stage 4 chronic kidney disease, or unspecified chronic kidney disease: Secondary | ICD-10-CM | POA: Diagnosis not present

## 2022-10-24 DIAGNOSIS — N183 Chronic kidney disease, stage 3 unspecified: Secondary | ICD-10-CM | POA: Diagnosis not present

## 2022-10-24 DIAGNOSIS — I872 Venous insufficiency (chronic) (peripheral): Secondary | ICD-10-CM | POA: Diagnosis not present

## 2022-10-24 DIAGNOSIS — E538 Deficiency of other specified B group vitamins: Secondary | ICD-10-CM | POA: Diagnosis not present

## 2022-10-24 DIAGNOSIS — E1122 Type 2 diabetes mellitus with diabetic chronic kidney disease: Secondary | ICD-10-CM | POA: Diagnosis not present

## 2022-10-24 DIAGNOSIS — E669 Obesity, unspecified: Secondary | ICD-10-CM | POA: Diagnosis not present

## 2022-10-24 DIAGNOSIS — C7951 Secondary malignant neoplasm of bone: Secondary | ICD-10-CM | POA: Diagnosis not present

## 2022-10-24 DIAGNOSIS — G9341 Metabolic encephalopathy: Secondary | ICD-10-CM | POA: Diagnosis not present

## 2022-10-24 DIAGNOSIS — R338 Other retention of urine: Secondary | ICD-10-CM | POA: Diagnosis not present

## 2022-10-24 DIAGNOSIS — M48062 Spinal stenosis, lumbar region with neurogenic claudication: Secondary | ICD-10-CM | POA: Diagnosis not present

## 2022-10-24 DIAGNOSIS — E876 Hypokalemia: Secondary | ICD-10-CM | POA: Diagnosis not present

## 2022-10-24 DIAGNOSIS — N401 Enlarged prostate with lower urinary tract symptoms: Secondary | ICD-10-CM | POA: Diagnosis not present

## 2022-10-24 DIAGNOSIS — Z466 Encounter for fitting and adjustment of urinary device: Secondary | ICD-10-CM | POA: Diagnosis not present

## 2022-10-24 DIAGNOSIS — Z9181 History of falling: Secondary | ICD-10-CM | POA: Diagnosis not present

## 2022-10-24 DIAGNOSIS — G301 Alzheimer's disease with late onset: Secondary | ICD-10-CM | POA: Diagnosis not present

## 2022-10-24 DIAGNOSIS — F0280 Dementia in other diseases classified elsewhere without behavioral disturbance: Secondary | ICD-10-CM | POA: Diagnosis not present

## 2022-10-24 DIAGNOSIS — Z510 Encounter for antineoplastic radiation therapy: Secondary | ICD-10-CM | POA: Diagnosis not present

## 2022-10-24 DIAGNOSIS — Z794 Long term (current) use of insulin: Secondary | ICD-10-CM | POA: Diagnosis not present

## 2022-10-24 DIAGNOSIS — D508 Other iron deficiency anemias: Secondary | ICD-10-CM | POA: Diagnosis not present

## 2022-10-24 DIAGNOSIS — C61 Malignant neoplasm of prostate: Secondary | ICD-10-CM | POA: Diagnosis not present

## 2022-10-24 LAB — RAD ONC ARIA SESSION SUMMARY
Course Elapsed Days: 7
Plan Fractions Treated to Date: 6
Plan Prescribed Dose Per Fraction: 3 Gy
Plan Total Fractions Prescribed: 10
Plan Total Prescribed Dose: 30 Gy
Reference Point Dosage Given to Date: 18 Gy
Reference Point Session Dosage Given: 3 Gy
Session Number: 6

## 2022-10-24 NOTE — Telephone Encounter (Signed)
From 10/14/22 discharge summary- AKI -due to urinary retention, Foley placed, continue.  Will need outpatient follow-up with urology.  Creatinine overall stable, his ARB was discontinued, continue to monitor blood pressure and resume as indicated Essential hypertension -continue metoprolol, hold ARB as above

## 2022-10-24 NOTE — Telephone Encounter (Signed)
Richardson Landry from [place] called for verbal orders for home PT for: 1X for 1 week 2X for 3 weeks 1X for 3 weeks  And for home nursing visits.   Please call Richardson Landry to confirm: [phone]

## 2022-10-24 NOTE — Telephone Encounter (Signed)
Spoke with steve to ok verbal orders.

## 2022-10-24 NOTE — Progress Notes (Signed)
  Radiation Oncology         [phone] ________________________________  Name: Nicholas Burton  [id number]  Date of Service: 10/24/22  DOB: 04-Jul-1928   Steroid Taper Instructions   You currently have a prescription for Dexamethasone 4 mg Tablets.    Beginning 10/31/22: Take 1/2 of a tablet (which is 2 mg) twice a day  Beginning 11/07/22: Take 1/2 of a tablet (which is 2 mg) once a day  Beginning 11/14/22: Take 1/2 of a tablet (which is 2 mg) every other day and stop on 11/20/22.   Please call our office if you have any progressive extremity weakness or loss of sensation.

## 2022-10-25 ENCOUNTER — Ambulatory Visit
Admission: RE | Admit: 2022-10-25 | Discharge: 2022-10-25 | Disposition: A | Discharge status: Home / Self Care | Payer: Medicare Other | Source: Ambulatory Visit | Attending: Radiation Oncology | Admitting: Radiation Oncology

## 2022-10-25 ENCOUNTER — Other Ambulatory Visit: Payer: Self-pay

## 2022-10-25 DIAGNOSIS — Z510 Encounter for antineoplastic radiation therapy: Secondary | ICD-10-CM | POA: Diagnosis not present

## 2022-10-25 DIAGNOSIS — C7951 Secondary malignant neoplasm of bone: Secondary | ICD-10-CM | POA: Diagnosis not present

## 2022-10-25 DIAGNOSIS — C61 Malignant neoplasm of prostate: Secondary | ICD-10-CM | POA: Diagnosis not present

## 2022-10-25 LAB — RAD ONC ARIA SESSION SUMMARY
Course Elapsed Days: 8
Plan Fractions Treated to Date: 7
Plan Prescribed Dose Per Fraction: 3 Gy
Plan Total Fractions Prescribed: 10
Plan Total Prescribed Dose: 30 Gy
Reference Point Dosage Given to Date: 21 Gy
Reference Point Session Dosage Given: 3 Gy
Session Number: 7

## 2022-10-26 ENCOUNTER — Other Ambulatory Visit: Payer: Self-pay

## 2022-10-26 ENCOUNTER — Ambulatory Visit
Admission: RE | Admit: 2022-10-26 | Discharge: 2022-10-26 | Disposition: A | Discharge status: Home / Self Care | Payer: Medicare Other | Source: Ambulatory Visit | Attending: Radiation Oncology | Admitting: Radiation Oncology

## 2022-10-26 ENCOUNTER — Ambulatory Visit: Payer: Medicare Other | Admitting: Internal Medicine

## 2022-10-26 DIAGNOSIS — C7951 Secondary malignant neoplasm of bone: Secondary | ICD-10-CM | POA: Diagnosis not present

## 2022-10-26 DIAGNOSIS — C61 Malignant neoplasm of prostate: Secondary | ICD-10-CM | POA: Diagnosis not present

## 2022-10-26 DIAGNOSIS — Z510 Encounter for antineoplastic radiation therapy: Secondary | ICD-10-CM | POA: Diagnosis not present

## 2022-10-26 LAB — RAD ONC ARIA SESSION SUMMARY
Course Elapsed Days: 9
Plan Fractions Treated to Date: 8
Plan Prescribed Dose Per Fraction: 3 Gy
Plan Total Fractions Prescribed: 10
Plan Total Prescribed Dose: 30 Gy
Reference Point Dosage Given to Date: 24 Gy
Reference Point Session Dosage Given: 3 Gy
Session Number: 8

## 2022-10-27 ENCOUNTER — Ambulatory Visit (INDEPENDENT_AMBULATORY_CARE_PROVIDER_SITE_OTHER): Payer: Medicare Other | Admitting: Nurse Practitioner

## 2022-10-27 ENCOUNTER — Other Ambulatory Visit: Payer: Self-pay

## 2022-10-27 ENCOUNTER — Inpatient Hospital Stay (HOSPITAL_BASED_OUTPATIENT_CLINIC_OR_DEPARTMENT_OTHER): Payer: Medicare Other | Admitting: Hematology

## 2022-10-27 ENCOUNTER — Ambulatory Visit
Admission: RE | Admit: 2022-10-27 | Discharge: 2022-10-27 | Disposition: A | Discharge status: Home / Self Care | Payer: Medicare Other | Source: Ambulatory Visit | Attending: Radiation Oncology | Admitting: Radiation Oncology

## 2022-10-27 VITALS — BP 98/66 | HR 82 | Temp 97.9°F | Ht 69.0 in

## 2022-10-27 VITALS — BP 102/53 | HR 81 | Temp 97.5°F | Resp 18 | Wt 163.4 lb

## 2022-10-27 DIAGNOSIS — I959 Hypotension, unspecified: Secondary | ICD-10-CM

## 2022-10-27 DIAGNOSIS — C7951 Secondary malignant neoplasm of bone: Secondary | ICD-10-CM

## 2022-10-27 DIAGNOSIS — H6091 Unspecified otitis externa, right ear: Secondary | ICD-10-CM

## 2022-10-27 DIAGNOSIS — C61 Malignant neoplasm of prostate: Secondary | ICD-10-CM

## 2022-10-27 DIAGNOSIS — I10 Essential (primary) hypertension: Secondary | ICD-10-CM | POA: Diagnosis not present

## 2022-10-27 DIAGNOSIS — I5042 Chronic combined systolic (congestive) and diastolic (congestive) heart failure: Secondary | ICD-10-CM

## 2022-10-27 DIAGNOSIS — Z510 Encounter for antineoplastic radiation therapy: Secondary | ICD-10-CM | POA: Diagnosis not present

## 2022-10-27 DIAGNOSIS — E118 Type 2 diabetes mellitus with unspecified complications: Secondary | ICD-10-CM | POA: Diagnosis not present

## 2022-10-27 DIAGNOSIS — H6123 Impacted cerumen, bilateral: Secondary | ICD-10-CM | POA: Diagnosis not present

## 2022-10-27 DIAGNOSIS — N183 Chronic kidney disease, stage 3 unspecified: Secondary | ICD-10-CM

## 2022-10-27 LAB — RAD ONC ARIA SESSION SUMMARY
Course Elapsed Days: 10
Plan Fractions Treated to Date: 9
Plan Prescribed Dose Per Fraction: 3 Gy
Plan Total Fractions Prescribed: 10
Plan Total Prescribed Dose: 30 Gy
Reference Point Dosage Given to Date: 27 Gy
Reference Point Session Dosage Given: 3 Gy
Session Number: 9

## 2022-10-27 MED ORDER — CIPROFLOXACIN-DEXAMETHASONE 0.3-0.1 % OT SUSP: OTIC | 0 refills | Status: DC

## 2022-10-27 MED ORDER — EMPAGLIFLOZIN 10 MG PO TABS: 10.0000 mg | ORAL_TABLET | Freq: Every day | ORAL | 0 refills | Status: DC

## 2022-10-27 MED ORDER — TORSEMIDE 10 MG PO TABS: 10.0000 mg | ORAL_TABLET | ORAL | 1 refills | Status: DC

## 2022-10-27 MED ORDER — EMPAGLIFLOZIN 10 MG PO TABS: ORAL_TABLET | ORAL | 0 refills | Status: DC

## 2022-10-27 NOTE — Progress Notes (Signed)
HEMATOLOGY/ONCOLOGY CLINIC NOTE  Date of Service: 10/27/2022  Patient Care Team: [name], MD as PCP - General [place] Lack Charlann Lange, MD as PCP - Cardiology (Cardiology) Szabat, Darnelle Maffucci, [facility] (Inactive) as Pharmacist (Pharmacist) Sherlynn Stalls, MD as Consulting Physician (Ophthalmology)  CHIEF COMPLAINTS/PURPOSE OF CONSULTATION:  L3 spinal metastasis from poorly differentiated prostate cancer   HISTORY OF PRESENTING ILLNESS:  Nicholas Burton is a wonderful 89 y.o. male who has been referred to [place] by Dr Laverna Peace MD for evaluation and management of metastatic poorly differentiated prostate cancer with oligometastatic disease to the L3.   He has a history of hypertension, chronic diastolic heart failure, dyslipidemia, diabetes type 2, dementia and was admitted to [facility] with acute encephalopathy 10/05/2022.   Patient had a CT of the head which showed no acute intracranial findings CT of the lumbar spine was done which showed a lytic destructive pattern involving L2-L3 and L4 vertebral bodies concerning for possible discitis versus osteomyelitis versus tumor.   MRI lumbar spine on 10/05/2022 showed Findings concerning for osteomyelitis discitis at the L3-4 level. Abnormal enhancing material within the ventral epidural space at the level of L3, concerning for epidural phlegmon. These changes superimposed on underlying spondylosis and facet arthrosis resultant severe spinal stenosis. While these changes are favored to reflect infection, a possible neoplastic process involving the L3 vertebral body remains difficult to exclude. [place] interval follow-up imaging may be helpful for further evaluation as warranted.  This was treated with empiric antibiotics and is being followed by infectious disease. Patient subsequently underwent a biopsy of L3 10/07/2022 which showed metastatic poorly differentiated prostate cancer.   Patient subsequently had a CT of the chest abdomen  pelvis without contrast at our recommendation which was done on 10/11/2022 and showed no evidence of metastatic disease in the chest abdomen or pelvis.  Diffuse bladder wall thickening with surrounding inflammatory stranding worrisome for cystitis.  Stable abnormal appearance of L3 and nonspecific presacral edema.  INTERVAL HISTORY Patient was last seen by me as an inpatient on 10/12/2022 and noted to have 1 to 2 days of progressive confusion in the context of underlying dementia.  At baseline he apparently was also oriented x 3. He also noted significant lower back pain as well as some left lower extremity weakness.  Today, he is accompanied by two of his sons He complains of extreme hearing loss. He also continues to have back pain, though it has improved. He reports that radiation improves his pain. His back pain is "okay" upon palpation. For pain management, he takes Tylenol and Oxycodone as needed.  He currently uses a urine catheter bag and will see a urologist on 11/15/2022. Before having a catheter, he was not aware that he urinates frequently. He never noticed any blood in his urine previously.  He denies any loss of appetite and regularly consumes 3 meals a day. Patient's son reports that patient has partial teeth. He denies any abdominal pain. His brother reports that patient is 60% disabled and does not meet the 70% threshold to receive assisted care at home. He adds that patient is on insulin as well. Patient plans on working with a physical therapist a couple days a week. In regards to Fhx, he does report that his mother has CMPD.   MEDICAL HISTORY:  Past Medical History:  Diagnosis Date   Allergy    ANEMIA ASSOCIATED W/OTHER [facility] NUTRITIONAL DEFIC 08/11/2010   Qualifier: Diagnosis of  By: Ronnald Ramp MD, [name].  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    History of skin cancer    Hyperlipidemia with target LDL less than 100 11/10/2008        Hypertension    Memory loss 05/14/2009   Obesity (BMI 30.0-34.9) 04/01/2013   Osteoarthritis    PSA elevation 10/25/2018   SKIN CANCER, HX OF 11/10/2008   Qualifier: Diagnosis of  By: Ronnald Ramp MD, [name].    Snoring 04/03/2014   Type II diabetes mellitus with manifestations ([place]) 11/10/2008   Estimated Creatinine Clearance: 38 mL/min (A) (by C-G formula based on SCr of 1.52 mg/dL (H)).    Venous stasis dermatitis of both lower extremities 01/29/2018   Vitamin D deficiency 11/10/2008    SURGICAL HISTORY: Past Surgical History:  Procedure Laterality Date   CHOLECYSTECTOMY     INGUINAL HERNIA REPAIR     x 3   IR FLUORO GUIDED NEEDLE PLC ASPIRATION/INJECTION LOC  10/07/2022   JOINT REPLACEMENT     KNEE SURGERY     x 2   MELANOMA EXCISION     x 3 -- Left arm   TOTAL KNEE ARTHROPLASTY     x 3    SOCIAL HISTORY: Social History   Socioeconomic History   Marital status: Married    Spouse name: Not on file   Number of children: 4   Years of education: Not on file   Highest education level: Not on file  Occupational History   Occupation: retired    Fish farm manager: RETIRED  Tobacco Use   Smoking status: Never   Smokeless tobacco: Never  Vaping Use   Vaping Use: Never used  Substance and Sexual Activity   Alcohol use: No   Drug use: No   Sexual activity: Yes    Birth control/protection: None  Other Topics Concern   Not on file  Social History Narrative   No regular exercise   Social Determinants of Health   Financial Resource Strain: Low Risk  (07/11/2022)   Overall Financial Resource Strain (CARDIA)    Difficulty of Paying Living Expenses: Not hard at all  Food Insecurity:  No Food Insecurity (10/27/2022)   Hunger Vital Sign    Worried About Running Out of Food in the Last Year: Never true    [place] in the Last Year: Never true  Transportation Needs: No Transportation Needs (10/27/2022)   PRAPARE - Hydrologist (Medical): No    Lack of Transportation (Non-Medical): No  Physical Activity: Inactive (07/09/2021)   Exercise Vital Sign    Days of Exercise per Week: 0 days    Minutes of Exercise per Session: 0 min  Stress: No Stress Concern Present (07/11/2022)   [place]    Feeling of Stress : Not at all  Social Connections: Moderately Isolated (07/11/2022)   Social Connection and Isolation Panel [NHANES]    Frequency of Communication with Friends and Family: Twice a week    Frequency of Social Gatherings with Friends and Family: Twice a week    Attends  Religious Services: Never    Active Member of Clubs or Organizations: No    Attends Archivist Meetings: Never    Marital Status: Married  Human resources officer Violence: Not At Risk (10/27/2022)   Humiliation, Afraid, Rape, and Kick questionnaire    Fear of Current or Ex-Partner: No    Emotionally Abused: No    Physically Abused: No    Sexually Abused: No    FAMILY HISTORY: Family History  Problem Relation Age of Onset   Heart disease Mother    Diabetes Mother    Heart disease Father    Cancer Sister    Cancer Brother    Diabetes Son    Diabetes Other     ALLERGIES:  is allergic to ace inhibitors, oxycodone, and penicillins.  MEDICATIONS:  Current Outpatient Medications  Medication Sig Dispense Refill   acetaminophen (TYLENOL) 325 MG tablet Take 1-2 tablets (325-650 mg total) by mouth every 6 (six) hours as needed for mild pain.     atorvastatin (LIPITOR) 10 MG tablet Take 1 tablet (10 mg total) by mouth daily. 30 tablet 0   cholecalciferol (VITAMIN D) 1000 units tablet Take 1 tablet  (1,000 Units total) by mouth daily. 30 tablet 0   ciprofloxacin-dexamethasone (CIPRODEX) OTIC suspension Instill 4 drops to RIGHT ear twice a day for 7 days 7.5 mL 0   Colesevelam HCl 3.75 g PACK MIX AND DRINK 1 PACKET DAILY (Patient taking differently: Take 1 packet by mouth daily.) 90 packet 0   cyanocobalamin (VITAMIN B12) 500 MCG tablet Take 1 tablet (500 mcg total) by mouth daily. 30 tablet 0   dexamethasone (DECADRON) 4 MG tablet Take 1 tablet (4 mg total) by mouth daily. 30 tablet 0   empagliflozin (JARDIANCE) 10 MG TABS tablet Take 1 tablet (10 mg total) by mouth daily. Take 1/2 tablet by mouth daily 30 tablet 0   HYDROcodone-acetaminophen (NORCO/VICODIN) 5-325 MG tablet Take 1-2 tablets by mouth every 4 (four) hours as needed for moderate pain. 30 tablet 0   insulin glargine (LANTUS) 100 UNIT/ML Solostar Pen Inject 12 Units into the skin 2 (two) times daily. 15 mL 11   lidocaine (LIDODERM) 5 % Place 1 patch onto the skin daily. Remove & Discard patch within 12 hours or as directed by MD 30 patch 0   magnesium gluconate (MAGONATE) 500 MG tablet Take 1 tablet (500 mg total) by mouth at bedtime. 30 tablet 0   melatonin 3 MG TABS tablet Take 1 tablet (3 mg total) by mouth at bedtime as needed. 30 tablet 0   metoprolol tartrate (LOPRESSOR) 25 MG tablet TAKE ONE-HALF (1/2) TABLET TWICE A DAY 60 tablet 0   Multiple Vitamins-Minerals (PRESERVISION/LUTEIN) CAPS Take 1 capsule by mouth 2 (two) times daily.     tamsulosin (FLOMAX) 0.4 MG CAPS capsule Take 1 capsule (0.4 mg total) by mouth daily. 30 capsule 0   torsemide (DEMADEX) 10 MG tablet Take 1 tablet (10 mg total) by mouth every other day. (Patient taking differently: Take 10 mg by mouth every other day. 1 tablet PO QOD) 30 tablet 1   Vibegron (GEMTESA) 75 MG TABS TAKE 1 TABLET DAILY (Patient taking differently: Take 75 mg by mouth daily.) 90 tablet 1   Vitamin D, Ergocalciferol, (DRISDOL) 1.25 MG (50000 UNIT) CAPS capsule Take 1 capsule (50,000  Units total) by mouth every 7 (seven) days. 5 capsule 0   No current facility-administered medications for this visit.    REVIEW OF SYSTEMS:    10  Point review of Systems was done is negative except as noted above.  PHYSICAL EXAMINATION: ECOG PERFORMANCE STATUS: 2 - Symptomatic, <50% confined to bed  . Vitals:   10/27/22 1040  BP: (!) 102/53  Pulse: 81  Resp: 18  Temp: (!) 97.5 F (36.4 C)  SpO2: 100%   Filed Weights   10/27/22 1040  Weight: 163 lb 6.4 oz (74.1 kg)   .Body mass index is 24.13 kg/m.  GENERAL:alert, in no acute distress and comfortable SKIN: no acute rashes, no significant lesions EYES: conjunctiva are pink and non-injected, sclera anicteric OROPHARYNX: MMM, no exudates, no oropharyngeal erythema or ulceration NECK: supple, no JVD LYMPH:  no palpable lymphadenopathy in the cervical, axillary or inguinal regions LUNGS: clear to auscultation b/l with normal respiratory effort HEART: regular rate & rhythm ABDOMEN:  normoactive bowel sounds , non tender, not distended. Extremity: no pedal edema PSYCH: alert & oriented x 3 with fluent speech NEURO: no focal motor/sensory deficits  LABORATORY DATA:  I have reviewed the data as listed  .    Latest Ref Rng & Units 10/19/2022    5:32 AM 10/17/2022    6:11 AM 10/13/2022    4:28 AM  CBC  WBC 4.0 - 10.5 K/uL 13.7  15.3  10.8   Hemoglobin 13.0 - 17.0 g/dL 11.3  10.7  11.5   Hematocrit 39.0 - 52.0 % 34.5  32.9  35.4   Platelets 150 - 400 K/uL 358  362  306     .    Latest Ref Rng & Units 10/17/2022    6:11 AM 10/13/2022    4:28 AM 10/12/2022    2:57 AM  CMP  Glucose 70 - 99 mg/dL 90  100  172   BUN 8 - 23 mg/dL '25  17  15   '$ Creatinine 0.61 - 1.24 mg/dL 1.54  1.53  1.49   Sodium 135 - 145 mmol/L 138  138  136   Potassium 3.5 - 5.1 mmol/L 3.5  3.6  3.6   Chloride 98 - 111 mmol/L 103  105  103   CO2 22 - 32 mmol/L '24  23  24   '$ Calcium 8.9 - 10.3 mg/dL 8.3  8.1  8.1   Total Protein 6.5 - 8.1 g/dL 4.9   5.2    Total Bilirubin 0.3 - 1.2 mg/dL 0.5  0.3    Alkaline Phos 38 - 126 U/L 137  115    AST 15 - 41 U/L 37  33    ALT 0 - 44 U/L 47  35       RADIOGRAPHIC STUDIES: I have personally reviewed the radiological images as listed and agreed with the findings in the report. CT CHEST ABDOMEN PELVIS WO CONTRAST  Result Date: 10/11/2022 CLINICAL DATA:  Pancreatic cancer staging EXAM: CT CHEST, ABDOMEN AND PELVIS WITHOUT CONTRAST TECHNIQUE: Multidetector CT imaging of the chest, abdomen and pelvis was performed following the standard protocol without IV contrast. RADIATION DOSE REDUCTION: This exam was performed according to the departmental dose-optimization program which includes automated exposure control, adjustment of the mA and/or kV according to patient size and/or use of iterative reconstruction technique. COMPARISON:  PET-CT 10/05/2005. Lumbar spine CT 10/05/2022. MRI lumbar spine 10/05/2022. FINDINGS: CT CHEST FINDINGS Cardiovascular: No significant vascular findings. Normal heart size. No pericardial effusion. Mediastinum/Nodes: No enlarged mediastinal, hilar, or axillary lymph nodes. Thyroid gland, trachea, and esophagus demonstrate no significant findings. Lungs/Pleura: Lungs are clear. No pleural effusion or pneumothorax. Musculoskeletal: No chest wall  mass or suspicious bone lesions identified. Multilevel degenerative changes affect the spine. CT ABDOMEN PELVIS FINDINGS Hepatobiliary: No focal liver abnormality is seen. Status post cholecystectomy. No biliary dilatation. Pancreas: No pancreatic ductal dilatation or surrounding inflammatory changes. There is atrophy of the pancreatic body and tail. Spleen: Normal in size without focal abnormality. Adrenals/Urinary Tract: Bilateral kidneys and adrenal glands are within normal limits. Bladder is decompressed with Foley catheter. There is diffuse bladder wall thickening with surrounding inflammatory stranding. Stomach/Bowel: Stomach is within normal  limits. Appendix appears normal. No evidence of bowel wall thickening, distention, or inflammatory changes. There is sigmoid and descending colon diverticulosis. Vascular/Lymphatic: Aortic atherosclerosis. No enlarged abdominal or pelvic lymph nodes. Duplicated IVC noted. Reproductive: Prostate gland is enlarged. Other: There is no ascites. There is a small fat containing right inguinal hernia. There is nonspecific presacral edema. Musculoskeletal: The bones are osteopenic. Severe degenerative changes affect the spine. There is mottled appearance of the L3 vertebral body with some subtle linear lucencies present in trace loss of vertebral body height. Paravertebral edema and stranding again noted at this level. Findings are unchanged from prior and compatible with MRI findings of osteomyelitis discitis at L3-L4 with possible neoplastic process of the L3 vertebral body. IMPRESSION: 1. No evidence for metastatic disease in the chest, abdomen or pelvis. 2. Diffuse bladder wall thickening with surrounding inflammatory stranding worrisome for cystitis. 3. Stable abnormal appearance of the L3 which may represent osteomyelitis/discitis versus neoplastic process. 4. Nonspecific presacral edema. 5. Colonic diverticulosis. Aortic Atherosclerosis (ICD10-I70.0). Electronically Signed   By: Ronney Asters M.D.   On: 10/11/2022 17:49   IR Fluoro Guide Ndl Plmt / BX  Result Date: 10/09/2022 INDICATION: Low back pain secondary to osteomyelitis/discitis at L3-L4. EXAM: FLUOROSCOPIC GUIDED CORE BIOPSY AT L3 MEDICATIONS: None. ANESTHESIA/SEDATION: Moderate (conscious) sedation was employed during this procedure. A total of Versed 1 mg and Fentanyl 25 mcg was administered intravenously by the radiology nurse. Total intra-service moderate Sedation Time: 23 minutes. The patient's level of consciousness and vital signs were monitored continuously by radiology nursing throughout the procedure under my direct supervision. COMPLICATIONS:  None immediate. PROCEDURE: Informed written consent was obtained from the patient after a thorough discussion of the procedural risks, benefits and alternatives. All questions were addressed. Maximal Sterile Barrier Technique was utilized including caps, mask, sterile gowns, sterile gloves, sterile drape, hand hygiene and skin antiseptic. A timeout was performed prior to the initiation of the procedure. Patient was laid prone on the fluoroscopic table. Skin overlying the lumbar region was then prepped and draped in the usual manner. The right pedicle at L3 was then identified, and the skin entry site was then infiltrated with 0.25% bupivacaine and carried to the pedicle itself. Under intermittent biplane fluoroscopy, a 13 gauge Cook spinal needle was then advanced to the posterior 1/3 at L3. Two passes were then made with a 16 gauge core biopsy needle. Biopsy material was then obtained with a 20 mL syringe and sent for analysis as per requesting MD. The 13 gauge Cook spinal needle was removed. Hemostasis was achieved at the skin entry site. Patient tolerated the procedure well. IMPRESSION: Status post fluoroscopic guided core biopsy at L3 as described. Electronically Signed   By: Luanne Bras M.D.   On: 10/09/2022 09:59   MR Lumbar Spine W Wo Contrast  Result Date: 10/06/2022 CLINICAL DATA:  Initial evaluation for low back pain, infection. EXAM: MRI LUMBAR SPINE WITHOUT AND WITH CONTRAST TECHNIQUE: Multiplanar and multiecho pulse sequences of the lumbar spine  were obtained without and with intravenous contrast. CONTRAST:  7.17m GADAVIST GADOBUTROL 1 MMOL/ML IV SOLN COMPARISON:  CT from earlier the same day. FINDINGS: Segmentation:  Examination degraded by motion artifact. Standard segmentation. Lowest well-formed disc space labeled the L5-S1 level. Alignment:  Moderate levoscoliosis.  No significant listhesis. Vertebrae: Abnormal T1 hypointensity, with heterogeneous STIR hyperintensity and enhancement seen  within the L3 vertebral body. Edema and enhancement noted within the adjacent L3-4 interspace as well as about the superior endplate of L4. Findings are nonspecific, but most concerning for possible osteomyelitis discitis. Surrounding paraspinous edema and inflammatory changes. No soft tissue collections. Abnormal enhancing material within the ventral epidural space at the level of L3 measures approximately 2.9 x 1.4 cm (series 8, image 19). Findings concerning for epidural phlegmon. 1.6 cm nonenhancing material at the level could reflect a small epidural abscess versus disc protrusion (series 8, image 17). Ventral epidural enhancement extends from the L1-2 through L4-5 interspace. While these findings are favored to be infectious/inflammatory in nature, a possible underlying neoplastic process involving the L3 vertebral body remains difficult to exclude. No other convincing evidence for acute infection elsewhere within the lumbar spine. Reactive endplate changes about the L1-2, L2-3, and L5-S1 interspaces favored to be degenerative. Mild edema and enhancement about the L2-3 and L3-4 facets also favored to be degenerative. Underlying bone marrow signal intensity diffusely heterogeneous. No other worrisome osseous lesions. Conus medullaris and cauda equina: Conus extends to the T12 level. Conus and cauda equina appear normal. Paraspinal and other soft tissues: Paraspinous edema and enhancement adjacent to the L3-4 interspace as above. No collections. Partially visualized urinary bladder is markedly distended with associated bladder wall trabeculation and/or diverticula. Mild bilateral hydroureteronephrosis, suspected to be related to bladder distension. Disc levels: L1-2: Advanced degenerative intervertebral disc space narrowing with diffuse disc bulge and disc desiccation. Reactive endplate spurring. Moderate right worse than left facet and ligament flavum hypertrophy. No significant spinal stenosis. Foramina remain  patent. L2-3: Moderate degenerative intervertebral disc space narrowing with diffuse disc bulge, asymmetric to the left. Associated reactive endplate spurring. 1.6 cm nonenhancing material within the right ventral epidural space could reflect a right subarticular disc protrusion or possibly epidural abscess (series 8, image 17). Moderate facet and ligament flavum hypertrophy. Resultant mild canal with moderate left lateral recess stenosis. Moderate left L2 foraminal narrowing. Right neural foramen remains patent. L3-4: Advanced intervertebral disc space narrowing with findings concerning for osteomyelitis discitis as above. Probable epidural phlegmon within the ventral epidural space at the level of L3 (series 8, image 19). Moderate left worse than right facet arthrosis. Resultant severe spinal stenosis. Moderate to severe left with mild right L3 foraminal narrowing. L4-5: Degenerative intervertebral disc space narrowing with disc desiccation and diffuse disc bulge. Left-sided reactive endplate spurring. Moderate facet hypertrophy. Resultant mild narrowing of the left lateral recess. Central canal remains patent. Mild to moderate left L4 foraminal narrowing. Right neural foramen remains patent. L5-S1: Degenerative intervertebral disc space narrowing with disc desiccation and diffuse disc bulge. Reactive endplate spurring. Moderate bilateral facet arthrosis. No significant spinal stenosis. Mild bilateral L5 foraminal narrowing. IMPRESSION: 1. Findings concerning for osteomyelitis discitis at the L3-4 level. Abnormal enhancing material within the ventral epidural space at the level of L3, concerning for epidural phlegmon. These changes superimposed on underlying spondylosis and facet arthrosis resultant severe spinal stenosis. While these changes are favored to reflect infection, a possible neoplastic process involving the L3 vertebral body remains difficult to exclude. [place] interval follow-up imaging may be helpful  for further evaluation as warranted. 2. Surrounding paraspinous edema and enhancement, but with no drainable soft tissue collection collections. 3. Underlying advanced multilevel degenerative spondylosis and facet arthrosis as detailed above. No other high-grade spinal stenosis 4. Markedly distended urinary bladder with associated bladder wall trabeculation and/or diverticula. Clinical correlation for chronic outlet obstruction recommended. Mild bilateral hydroureteronephrosis, suspected to be related to bladder distension. Electronically Signed   By: Jeannine Boga M.D.   On: 10/06/2022 00:47   CT Lumbar Spine Wo Contrast  Result Date: 10/05/2022 CLINICAL DATA:  Low back pain.  Increased fracture risk. EXAM: CT LUMBAR SPINE WITHOUT CONTRAST TECHNIQUE: Multidetector CT imaging of the lumbar spine was performed without intravenous contrast administration. Multiplanar CT image reconstructions were also generated. RADIATION DOSE REDUCTION: This exam was performed according to the departmental dose-optimization program which includes automated exposure control, adjustment of the mA and/or kV according to patient size and/or use of iterative reconstruction technique. COMPARISON:  Radiography 07/26/2022 FINDINGS: Segmentation: 5 lumbar type vertebral bodies. Alignment: Chronic mild S shaped scoliotic curvature. Vertebrae: Chronic degenerative endplate changes at the L1-2 level. Newly seen lytic destructive pattern throughout the L3 vertebral body, worse towards the left side. Question some involvement of the L2 vertebral body and superior aspect of the L4 vertebral body. Lucent focus within the central portion of the S1 vertebral body. These findings could be due to metastatic disease, infection or both. See below. Paraspinal and other soft tissues: Distended bladder. Aortic atherosclerosis. Pronounced edematous change in the soft tissues surrounding the L3 vertebral body, worrisome in particular for infection  but possibly consistent with tumor as well. Disc levels: T9-10 through T12-L1: Negative L1-2: Chronic disc degeneration with loss of disc height. Endplate osteophytes and mild bulging of the disc. No compressive stenosis. L2-3: Disc space narrowing and vacuum phenomenon. Circumferential bulging of the disc. Facet and ligamentous hypertrophy. Multifactorial stenosis at this level that could cause neural compression. As noted above, there may be some lytic changes of the inferior L2 vertebral body. This raises the possibility of discitis osteomyelitis involving this level. L3-4: Lytic destructive appearance of the L3 vertebral body with loss of height more on the left. Edematous change of the paravertebral soft tissues. Bulging of the disc. Facet and ligamentous hypertrophy. Severe stenosis at this level. Epidural abscess not excluded. As noted above, there could be lesser destructive change of superior L4. Certainly the findings at this level or worrisome for discitis osteomyelitis. L4-5: Chronic disc degeneration with loss of disc height. Bulging of the disc. Facet and ligamentous hypertrophy. Moderate degenerative type stenosis. L5-S1: Chronic disc degeneration with loss of disc height. Endplate osteophytes and bulging of the disc. Facet degeneration. No canal stenosis. Mild bilateral foraminal stenosis. Lucent area within the S1 vertebral body which is indeterminate. IMPRESSION: 1. Lytic destructive pattern throughout the L3 vertebral body with loss of height more on the left. Question some involvement of the L2 vertebral body and superior aspect of the L4 vertebral body. Pronounced edematous change in the paravertebral soft tissues surrounding the L3 vertebral body, worrisome for infection but possibly consistent with tumor as well. 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 Severe stenosis at the L2-3 and L3-4 levels, possibly resulting in neurogenic bladder. Epidural abscess is not  excluded. Is the patient an MR candidate? 2. Lucent area within the S1 vertebral body which is indeterminate. 3. Chronic degenerative changes at L1-2, L4-5 and L5-S1 as above. Aortic Atherosclerosis (ICD10-I70.0). Electronically Signed   By: Nelson Chimes  M.D.   On: 10/05/2022 21:20   DG Chest 1 View  Result Date: 10/05/2022 CLINICAL DATA:  Altered mental status EXAM: CHEST  1 VIEW COMPARISON:  06/02/2021 FINDINGS: Heart is normal size. Mediastinal contours within normal limits. Bibasilar atelectasis. No effusions or pneumothorax. No acute bony abnormality. IMPRESSION: Bibasilar atelectasis. Electronically Signed   By: Rolm Baptise M.D.   On: 10/05/2022 21:13   DG Pelvis 1-2 Views  Result Date: 10/05/2022 CLINICAL DATA:  Left hip and pelvic pain EXAM: PELVIS - 1-2 VIEW COMPARISON:  None Available. FINDINGS: Mild symmetric degenerative changes in the hips with joint space narrowing and spurring. No acute bony abnormality. Specifically, no fracture, subluxation, or dislocation. Degenerative changes in the visualized lower lumbar spine. IMPRESSION: No acute bony abnormality. Electronically Signed   By: Rolm Baptise M.D.   On: 10/05/2022 21:13   CT Head Wo Contrast  Result Date: 10/05/2022 CLINICAL DATA:  Delirium.  Low back pain. EXAM: CT HEAD WITHOUT CONTRAST TECHNIQUE: Contiguous axial images were obtained from the base of the skull through the vertex without intravenous contrast. RADIATION DOSE REDUCTION: This exam was performed according to the departmental dose-optimization program which includes automated exposure control, adjustment of the mA and/or kV according to patient size and/or use of iterative reconstruction technique. COMPARISON:  MRI 02/28/2004 FINDINGS: Brain: Generalized brain volume loss. Moderate chronic small-vessel ischemic changes of the white matter. No sign of acute infarction, mass lesion, hemorrhage, hydrocephalus or extra-axial collection. Vascular: There is atherosclerotic  calcification of the major vessels at the base of the brain. Skull: Negative Sinuses/Orbits: Opacified right maxillary sinus.  Orbits negative. Other: None IMPRESSION: 1. No acute CT finding. Atrophy and chronic small-vessel ischemic changes of the white matter. 2. Opacified right maxillary sinus. Electronically Signed   By: Nelson Chimes M.D.   On: 10/05/2022 21:11    ASSESSMENT & PLAN:   89 year old male with no previous history of malignancy.  Previous history of hypertension, dyslipidemia, diabetes, CHF dementia though alert oriented x 3 at baseline admitted with altered mental status which is now more or less cleared significant new back pain.   #1 Pathologic L3 compression due to bone metastases from metastatic poorly differentiated prostate cancer.  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 Cannot rule out additional infection.  No cultures were sent from his L3 biopsy. PSA levels are elevated at 55.1 CT chest abdomen pelvis did not show any other sites of overt metastatic disease at this time or overt prostate lesions.   #2 history of dementia though apparently alert oriented x 3 at baseline with poor cognition.   #3 hypertension, dyslipidemia, diabetes, CHF.  PLAN:  -Discussed recent biopsy which revealed obvious signs of prostate cancer -Discussed recent elevated blood tests with results in the 38s. Disease elsewhere likely not reveling on CT scan. -Discussed option of receiving a PSMA PET scan. Discussed that this scan is much more sensitive to evalutate extensiveness of disease.  -Discussed option of receiving a Lupron injection once a month which  blocks hormones that feed prostate cancer. Informed patient that this may keep cancer stable without any additional treatment -if disease progresses, there are other hormone blocking tablets available which were discussed with patient -CT scan did not show any extensive disease -Do  not anticipate aggressive treatment at this time. Recommend Lupron injection once a month. Patient can consolidate care with either urologist or with me.  -if further bone disease, discussed option of Xgeva to help with bone  mets. -No extensive bone disease based on CT scan, this medication is not necessary at this time and patient does note only having partial teeth at this time. -informed patients brothers that his cancer is not metastatically curable -Informed patient to limit Oxycodone for back pain and only take it as needed as it may cause constipation issues  -recommended assistance with personal care at home -discussed option of hospice services for comfort care if disease further progresses -discussed potential side effects of Lupron injections, such as fatigue, hot flashes, or drop in testosterone levels -patient would like to proceed with receiving monthly Lucron injections to block hormones at this time -Order PSMA PET/CT scan -connected patient with social worker to help with assisted care at home  FOLLOW-UP: PSMA PET/CT for newly diagnosed prostate cancer Start Leuprolide shot in 1 week RTC with Dr Irene Limbo with labs with in 2 weeks after 1st Leuprolide shot for toxicity check  The total time spent in the appointment was 40 minutes* .  All of the patient's questions were answered with apparent satisfaction. The patient knows to call the clinic with any problems, questions or concerns.   Sullivan Lone MD MS AAHIVMS [facility] Hematology/Oncology Physician [facility]  .*Total Encounter Time as defined by the Centers for Medicare and Medicaid Services includes, in addition to the face-to-face time of a patient visit (documented in the note above) non-face-to-face time: obtaining and reviewing outside history, ordering and reviewing medications, tests or procedures, care coordination (communications with other health care professionals or caregivers) and documentation in the  medical record.   I,Mitra Faeizi,acting as a Education administrator for Sullivan Lone, MD.,have documented all relevant documentation on the behalf of Sullivan Lone, MD,as directed by  Sullivan Lone, MD while in the presence of Sullivan Lone, MD.  .I have reviewed the above documentation for accuracy and completeness, and I agree with the above. Brunetta Genera MD

## 2022-10-27 NOTE — Assessment & Plan Note (Signed)
Blood pressure hypotensive today.  Discussed with patient's PCP regarding medication adjustment options.  Will recommend changing torsemide 10 mg tablet to 1 tablet every other day, follow-up in 2 weeks for close monitoring.  Patient family educated to monitor for signs of swelling, shortness of breath, and/or weight gain and if these were to occur to call cardiology or our office for further recommendations.

## 2022-10-27 NOTE — Assessment & Plan Note (Signed)
Chronic, blood pressure hypotensive today.  Discussed with patient's PCP regarding medication adjustment options.  Will recommend changing torsemide 10 mg tablet to 1 tablet every other day, follow-up in 2 weeks for close monitoring.

## 2022-10-27 NOTE — Patient Instructions (Signed)
Reduce dose of Torsemide to 1 tablet by mouth every OTHER day. If any signs of swelling, shortness of breath or weight gain of 3lbs over night/5 lbs over a week call your cardiologist.

## 2022-10-27 NOTE — Progress Notes (Signed)
PRE-PROCEDURE EXAM: Bilateral TM cannot be visualized due to total occlusion/impaction of the ear canal.  PROCEDURE INDICATION: remove wax to visualize ear drum & relieve discomfort  CONSENT:  Verbal     PROCEDURE NOTE:     Bilateral  EAR:  I used warm water irrigation under direct visualization with the otoscope to free the wax bolus from the ear canal.    POST- PROCEDURE EXAM: Bilateral TMs successfully visualized and found to have little erythema on the right ear     The patient tolerated the procedure well.

## 2022-10-27 NOTE — Progress Notes (Signed)
Established Patient Office Visit  Subjective   Patient ID: Nicholas Burton, male    DOB: 01-23-1928  Age: 89 y.o. MRN: [id number]  Chief Complaint  Patient presents with   Hearing Problem    Patient presents to office with family. Noticed worsening hearing that has gotten worse. Family concerned about cerumen impaction.  Patient recently discharged from hospital, has been diagnosed with metastatic cancer.  Scheduled to follow-up with oncologist later today.  Blood pressure was low intermittently during hospitalization is also low today.  Per patient and family member, he appears to be asymptomatic and is not complaining about dizziness.    ROS: see HPI    Objective:     BP 98/66   Pulse 82   Temp 97.9 F (36.6 C) (Temporal)   Ht '5\' 9"'$  (1.753 m)   SpO2 98%   BMI 25.10 kg/m  BP Readings from Last 3 Encounters:  10/27/22 98/66  10/22/22 139/74  10/14/22 123/67   Wt Readings from Last 3 Encounters:  10/14/22 169 lb 15.6 oz (77.1 kg)  10/11/22 169 lb 15.6 oz (77.1 kg)  09/21/22 170 lb (77.1 kg)      Physical Exam Vitals reviewed.  Constitutional:      Appearance: Normal appearance.  HENT:     Head: Normocephalic and atraumatic.     Right Ear: Tympanic membrane normal.     Left Ear: There is impacted cerumen.     Ears:     Comments: Redness to ear canal and swelling noted following ear lavage Cardiovascular:     Rate and Rhythm: Normal rate and regular rhythm.  Pulmonary:     Effort: Pulmonary effort is normal.     Breath sounds: Normal breath sounds.  Musculoskeletal:     Cervical back: Neck supple.  Skin:    General: Skin is warm and dry.  Neurological:     Mental Status: He is alert and oriented to person, place, and time.  Psychiatric:        Mood and Affect: Mood normal.        Behavior: Behavior normal.        Thought Content: Thought content normal.        Judgment: Judgment normal.      No results found for any visits on 10/27/22.    The  ASCVD Risk score (Arnett DK, et al., 2019) failed to calculate for the following reasons:   The 2019 ASCVD risk score is only valid for ages 31 to 30    Assessment & Plan:   Problem List Items Addressed This Visit       Cardiovascular and Mediastinum   Essential hypertension    Chronic, blood pressure hypotensive today.  Discussed with patient's PCP regarding medication adjustment options.  Will recommend changing torsemide 10 mg tablet to 1 tablet every other day, follow-up in 2 weeks for close monitoring.      Relevant Medications   torsemide (DEMADEX) 10 MG tablet   Hypotension - Primary    Blood pressure hypotensive today.  Discussed with patient's PCP regarding medication adjustment options.  Will recommend changing torsemide 10 mg tablet to 1 tablet every other day, follow-up in 2 weeks for close monitoring.  Patient family educated to monitor for signs of swelling, shortness of breath, and/or weight gain and if these were to occur to call cardiology or our office for further recommendations.      Relevant Medications   torsemide (DEMADEX) 10 MG tablet  Nervous and Auditory   Otitis externa of right ear    Following lavage, right ear canal appeared red and swollen.  Had PCP also look at ear, tympanic membrane appears to be intact but evidence of otitis externa noted.  Due to patient having diabetes and increased risk for severe illness will treat with Ciprodex drops, 4 drops in right ear twice a day x 1 week.      Relevant Medications   ciprofloxacin-dexamethasone (CIPRODEX) OTIC suspension   Cerumen impaction    Bilateral ear lavage completed today.        Genitourinary   CRI (chronic renal insufficiency), stage 3 (moderate)   Relevant Medications   empagliflozin (JARDIANCE) 10 MG TABS tablet   Other Visit Diagnoses     Type II diabetes mellitus with manifestations ([place])       Relevant Medications   empagliflozin (JARDIANCE) 10 MG TABS tablet   Chronic combined  systolic and diastolic congestive heart failure (HCC)       Relevant Medications   empagliflozin (JARDIANCE) 10 MG TABS tablet   torsemide (DEMADEX) 10 MG tablet       Return for 2 weeks with Judson Roch or Dr. Ronnald Ramp.    Ailene Ards, NP

## 2022-10-27 NOTE — Assessment & Plan Note (Signed)
Bilateral ear lavage completed today 

## 2022-10-27 NOTE — Assessment & Plan Note (Signed)
Following lavage, right ear canal appeared red and swollen.  Had PCP also look at ear, tympanic membrane appears to be intact but evidence of otitis externa noted.  Due to patient having diabetes and increased risk for severe illness will treat with Ciprodex drops, 4 drops in right ear twice a day x 1 week.

## 2022-10-28 ENCOUNTER — Telehealth: Payer: Self-pay | Admitting: Hematology

## 2022-10-28 ENCOUNTER — Other Ambulatory Visit: Payer: Self-pay

## 2022-10-28 ENCOUNTER — Ambulatory Visit
Admission: RE | Admit: 2022-10-28 | Discharge: 2022-10-28 | Disposition: A | Discharge status: Home / Self Care | Payer: Medicare Other | Source: Ambulatory Visit | Attending: Radiation Oncology | Admitting: Radiation Oncology

## 2022-10-28 ENCOUNTER — Encounter: Payer: Self-pay | Admitting: Radiation Oncology

## 2022-10-28 DIAGNOSIS — C7951 Secondary malignant neoplasm of bone: Secondary | ICD-10-CM | POA: Diagnosis not present

## 2022-10-28 DIAGNOSIS — I130 Hypertensive heart and chronic kidney disease with heart failure and stage 1 through stage 4 chronic kidney disease, or unspecified chronic kidney disease: Secondary | ICD-10-CM | POA: Diagnosis not present

## 2022-10-28 DIAGNOSIS — C61 Malignant neoplasm of prostate: Secondary | ICD-10-CM | POA: Diagnosis not present

## 2022-10-28 DIAGNOSIS — E1122 Type 2 diabetes mellitus with diabetic chronic kidney disease: Secondary | ICD-10-CM | POA: Diagnosis not present

## 2022-10-28 DIAGNOSIS — I5042 Chronic combined systolic (congestive) and diastolic (congestive) heart failure: Secondary | ICD-10-CM | POA: Diagnosis not present

## 2022-10-28 DIAGNOSIS — N183 Chronic kidney disease, stage 3 unspecified: Secondary | ICD-10-CM | POA: Diagnosis not present

## 2022-10-28 DIAGNOSIS — Z510 Encounter for antineoplastic radiation therapy: Secondary | ICD-10-CM | POA: Insufficient documentation

## 2022-10-28 LAB — RAD ONC ARIA SESSION SUMMARY
Course Elapsed Days: 11
Plan Fractions Treated to Date: 10
Plan Prescribed Dose Per Fraction: 3 Gy
Plan Total Fractions Prescribed: 10
Plan Total Prescribed Dose: 30 Gy
Reference Point Dosage Given to Date: 30 Gy
Reference Point Session Dosage Given: 3 Gy
Session Number: 10

## 2022-10-28 NOTE — Telephone Encounter (Signed)
Called patient per 2/29 los notes and spoke with patient's spouse. Patient scheduled and notified.

## 2022-10-31 ENCOUNTER — Inpatient Hospital Stay: Payer: Medicare Other | Attending: Hematology

## 2022-10-31 ENCOUNTER — Telehealth: Payer: Self-pay | Admitting: Internal Medicine

## 2022-10-31 DIAGNOSIS — E1122 Type 2 diabetes mellitus with diabetic chronic kidney disease: Secondary | ICD-10-CM | POA: Diagnosis not present

## 2022-10-31 DIAGNOSIS — C61 Malignant neoplasm of prostate: Secondary | ICD-10-CM | POA: Insufficient documentation

## 2022-10-31 DIAGNOSIS — N183 Chronic kidney disease, stage 3 unspecified: Secondary | ICD-10-CM | POA: Diagnosis not present

## 2022-10-31 DIAGNOSIS — Z5111 Encounter for antineoplastic chemotherapy: Secondary | ICD-10-CM | POA: Insufficient documentation

## 2022-10-31 DIAGNOSIS — C7951 Secondary malignant neoplasm of bone: Secondary | ICD-10-CM | POA: Insufficient documentation

## 2022-10-31 DIAGNOSIS — I130 Hypertensive heart and chronic kidney disease with heart failure and stage 1 through stage 4 chronic kidney disease, or unspecified chronic kidney disease: Secondary | ICD-10-CM | POA: Diagnosis not present

## 2022-10-31 DIAGNOSIS — I5042 Chronic combined systolic (congestive) and diastolic (congestive) heart failure: Secondary | ICD-10-CM | POA: Diagnosis not present

## 2022-10-31 NOTE — Telephone Encounter (Signed)
[place]:  Nicholas Burton   Phone Number:  [phone]   Needs Verbal orders for what service & frequency:  Occupational therapy - 2 x a week for 2 weeks, 1 week x 1  For:  Strength, balance and endurance training so that the patient will be more independent with his self care.

## 2022-10-31 NOTE — Telephone Encounter (Signed)
Verbal orders given as requested. 

## 2022-10-31 NOTE — Progress Notes (Signed)
[place] Work  Initial Assessment   Nicholas Burton is a 89 y.o. year old male contacted caregiver by phone. Clinical Social Work was referred by nurse for assessment of psychosocial needs.   SDOH (Social Determinants of Health) assessments performed: Yes SDOH Interventions    Flowsheet Row Office Visit from 10/27/2022 in [place] at [place] from 07/11/2022 in [place] at [facility] from 04/01/2022 in [place] at [facility] from 02/02/2022 in [place] at [facility] from 12/27/2021 in [place] at [facility] from 09/09/2021 in [place] at [place] Interventions Intervention Not Indicated Intervention Not Indicated Intervention Not Indicated  [confirms was provided resources for meals on Wheels and is now on wait list,  denies food insecurity] Intervention Not Indicated, Other (Comment)  [spouse denies true food insecurity, but states that PCP has recommended that she and patient look into getting on Meals on Wheels wait list: [place] referral placed accordingly] Intervention Not Indicated  [spouse continues to deny food insecurity] Intervention Not Indicated  [continues to deny food insecurity]  Housing Interventions Intervention Not Indicated Intervention Not Indicated -- Intervention Not Indicated  [continues to reside in single family home with spouse only,  family members live close by and check in daily] Intervention Not Indicated  [continues to reside with spouse with additional support from other family members that live nearby and are actively involved] --  Transportation Interventions Intervention Not Indicated Intervention Not Indicated Intervention Not Indicated   [reports family continues to provide transportation] Intervention Not Indicated  [family continues to provide transportation,  wife occasionally drives [place] distances as/ if indicated] Intervention Not Indicated  [family continues to provide transportation] Intervention Not Indicated  [family continues to provide transportation]  Utilities Interventions Intervention Not Indicated Intervention Not Indicated -- -- -- --  Alcohol Usage Interventions -- Intervention Not Indicated (Score <7) -- -- -- --  Financial Strain Interventions -- Intervention Not Indicated -- -- -- --  Stress Interventions -- Intervention Not Indicated -- -- -- --  Social Connections Interventions -- Intervention Not Indicated -- -- -- --       SDOH Screenings   Food Insecurity: No Food Insecurity (10/27/2022)  Housing: Low Risk  (10/27/2022)  Transportation Needs: No Transportation Needs (10/27/2022)  Utilities: Not At Risk (10/27/2022)  Alcohol Screen: Low Risk  (07/11/2022)  Depression (PHQ2-9): Low Risk  (10/27/2022)  Financial Resource Strain: Low Risk  (07/11/2022)  Physical Activity: Inactive (07/09/2021)  Social Connections: Moderately Isolated (07/11/2022)  Stress: No Stress Concern Present (07/11/2022)  Tobacco Use: Low Risk  (10/14/2022)     Distress Screen completed: No     No data to display            Family/Social Information:  Housing Arrangement: patient lives with his wife, [name]. Family members/support persons in your life? Family, Friends, and [employer].  Patient has four sons who are all supportive.  Their granddaughter, Anderson Malta, works from their home five days a week. Transportation concerns: no  Employment: Retired Patient was in [employer] for 27 years..  Income source: Paediatric nurse concerns: No Type of concern: None Food access concerns: no Religious or spiritual practice:  [place] are of [employer]. Services Currently in place:  Medicare and  Tricare.  Coping/ Adjustment to diagnosis: Patient understands treatment plan and what happens next? yes Concerns about diagnosis and/or treatment: Patient's wife did not express any concerns about his diagnosis or treatment. Patient reported stressors: Physical issues.  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 Hopes and/or priorities: Family Patient enjoys time with family/ friends Current coping skills/ strengths: Scientist, research (life sciences) , Religious Affiliation , and Supportive family/friends     SUMMARY: Current SDOH Barriers:  None per patient's wife.  Clinical Social Work Clinical Goal(s):  To provide self-care activities for patient and his spouse.  Interventions: Discussed common feeling and emotions when being diagnosed with cancer, and the importance of support during treatment Informed patient of the support team roles and support services at [facility] Provided CSW contact information and encouraged patient to call with any questions or concerns Provided patient with information about the [facility] and [place] [facility].  Discussed transportation since patient is having to rely on family.  One of their sons is contacting the [place] to determine if patient has benefits.   Follow Up Plan: Patient will contact CSW with any support or resource needs Patient verbalizes understanding of plan: Yes    Rodman Pickle Janeka Libman, LCSW

## 2022-11-01 ENCOUNTER — Other Ambulatory Visit: Payer: Medicare Other

## 2022-11-01 ENCOUNTER — Encounter: Payer: Self-pay | Admitting: Hematology

## 2022-11-01 VITALS — BP 90/56 | HR 88 | Temp 96.2°F

## 2022-11-01 DIAGNOSIS — Z515 Encounter for palliative care: Secondary | ICD-10-CM

## 2022-11-01 NOTE — Progress Notes (Signed)
                                                                                                                                                             Patient Name: Nicholas Burton MRN: [id number] DOB: [birth year] Referring Physician: Marzetta Board (Profile Not Attached) Date of Service: 10/28/2022 [facility], [place]                                                        End Of Treatment Note  Diagnoses: C79.51-Secondary malignant neoplasm of bone  Cancer Staging:  Metastatic Prostate Cancer to Spine   Intent: Palliative  Radiation Treatment Dates:   10/17/2022 through 10/28/2022 Site Technique Total Dose (Gy) Dose per Fx (Gy) Completed Fx Beam Energies  Lumbar Spine: Spine 3D 30/30 3 10/10 10X, 15X   Narrative: The patient tolerated radiation therapy relatively well. His pain improved during the course of radiation, and has a steroid taper instruction given at the conclusion of radiation.  Plan: The patient will receive a call in about one month from the radiation oncology department. He will continue follow up with Dr. Irene Limbo as well.   ________________________________________________    Carola Rhine, [facility]

## 2022-11-01 NOTE — Progress Notes (Signed)
COMMUNITY PALLIATIVE CARE SW NOTE  PATIENT NAME: Nicholas Burton DOB: 08-08-1928 MRN: [id number]  PRIMARY CARE PROVIDER: [name], MD  RESPONSIBLE PARTY:  Acct ID - Guarantor Home Phone Work Phone Relationship Acct Type  [id number] JONAVEN, CALLANAN [id number]  Self P/F     [place], [place], [place] [id number]   Palliative Care Follow-up Visit/Clinical Social Work  Follow-up visit with patient post hospitalization.  Patient's wife and granddaughter-Jennifer was present.   Patient had recent hospitalization from 2/16-2/24/24. New diagnosis of Stage 4 metastatic prostate cancer in which he will begin therapy.  He has a Foley catheter. He feel that he does have to use the bathroom. He continues to use his walker. Patient report regular bowel movement. RN recommend Senna as patient starts to take more pain medication. Patient rate his pain as a 1 or 2. He will use a heating pad or lidocaine patch or Tylenol to treat. He is still able to get up independently. He continues to do personal care to include showers. He is able to dress himself. He is receiving physical and occupational therapist. Patient remain hard of hearing and has some vision deficits. Patient has lost some weight and he is currently 167 lbs. Patient's clothes were looser and hanging off of him.  Patient continues to have shortness of breath with exertion. He will see [facility]. He has some trace edema in his legs.   Next appointment: November 30, 2022 @ 1:30 pm.      SOCIAL HX: Patient resides in his home with his wife with daily contact with family members. Social History   Tobacco Use   Smoking status: Never   Smokeless tobacco: Never  Substance Use Topics   Alcohol use: No    CODE STATUS: DNR ADVANCED DIRECTIVES: No MOST FORM COMPLETE:  No HOSPICE EDUCATION PROVIDED: No  Duration of visit and documentation:  60 minutes  Tania Perrott, LCSW

## 2022-11-01 NOTE — Progress Notes (Signed)
PATIENT NAME: Nicholas Burton DOB: 1928-03-20 MRN: [id number]  PRIMARY CARE PROVIDER: [name], MD  RESPONSIBLE PARTY:  Acct ID - Guarantor Home Phone Work Phone Relationship Acct Type  [id number] OSIAH, [id number] [id number]  Self P/F     [address] [place], [place], [place] [address]    Palliative Care Follow Up  Encounter Note    Completed home visit with [name], SW. Granddaughter Anderson [place] and wife Denice Paradise also present. Granddaughter now lives with pt and wife     HISTORY OF PRESENT ILLNESS:  COPD; sleep apnea; diabetic; wet macular degeneration; 60% hearing loss; spinal stenosis; stage IV metastatic prostate CA   Cognitive: Age related Dementia  ADLs: independent grooming; needs assist w/getting in and out of the house   Appetite: eats 2 meals and 2 snacks per day; usually breakfast & dinner; family wants pt to eat eggs but he doesn't want to eat them daily; LPN gave suggestion of using powdered protein in his almond milk to use with pt's morning cereal or make a palatable protein shake to drink w/breakfast   Diabetes: uses the [place] 2 to keep up with BS levels; reports A1C at around 8 and blood sugar of 80 this AM   Cardiac: still SOB at times; BLE trace fluid in ankles  GI/GU: currently has a foley cath due to blockage; has a urology appt has a BM every other day   Mobility: walks w/walker; able to get out of recliner chair without assist; can get in and out of bed    Sleeping Pattern: awake at night d/t frequent urination from diuretic and sometimes low BS; he still eats a snack at night before bed   Respiratory: continues to have SOB on exertion   Pain: 2/10 pain in the back that is well managed at this time; family believes the pain is CA related   Wt: wife Denice Paradise reports pt lost wt d/t Jardiance and was told to cut the dose in half; 167lbs    Goals of Care: Stay in the home with care until death  CODE STATUS: DNR ADVANCED DIRECTIVES: N MOST FORM: N PPS:  50%   PHYSICAL EXAM:   VITALS: Today's Vitals   11/01/22 1305  BP: (!) 90/56  Pulse: 88  Temp: (!) 96.2 F (35.7 C)  SpO2: 99%    LUNGS: clear to auscultation  CARDIAC: Cor RRR EXTREMITIES: BLE ankles with trace edema SKIN: pale NEURO: negative except for memory problems       Rieley Hausman [name], LPN

## 2022-11-02 ENCOUNTER — Encounter: Payer: Self-pay | Admitting: Hematology

## 2022-11-02 DIAGNOSIS — C61 Malignant neoplasm of prostate: Secondary | ICD-10-CM | POA: Diagnosis not present

## 2022-11-02 DIAGNOSIS — C7951 Secondary malignant neoplasm of bone: Secondary | ICD-10-CM | POA: Diagnosis not present

## 2022-11-02 DIAGNOSIS — E1122 Type 2 diabetes mellitus with diabetic chronic kidney disease: Secondary | ICD-10-CM | POA: Diagnosis not present

## 2022-11-02 DIAGNOSIS — N183 Chronic kidney disease, stage 3 unspecified: Secondary | ICD-10-CM | POA: Diagnosis not present

## 2022-11-02 DIAGNOSIS — I130 Hypertensive heart and chronic kidney disease with heart failure and stage 1 through stage 4 chronic kidney disease, or unspecified chronic kidney disease: Secondary | ICD-10-CM | POA: Diagnosis not present

## 2022-11-02 DIAGNOSIS — I5042 Chronic combined systolic (congestive) and diastolic (congestive) heart failure: Secondary | ICD-10-CM | POA: Diagnosis not present

## 2022-11-02 NOTE — Telephone Encounter (Signed)
Christy calls today with [facility] regarding extending these orders. They would like to extend the nursing orders to  Once a week for four weeks.  Verbal can be given at  [facility]: [phone]

## 2022-11-02 NOTE — Telephone Encounter (Signed)
Spoke with Christy with [facility] gave verbal orders   Once a week for four weeks.

## 2022-11-03 DIAGNOSIS — E1122 Type 2 diabetes mellitus with diabetic chronic kidney disease: Secondary | ICD-10-CM | POA: Diagnosis not present

## 2022-11-03 DIAGNOSIS — I5042 Chronic combined systolic (congestive) and diastolic (congestive) heart failure: Secondary | ICD-10-CM | POA: Diagnosis not present

## 2022-11-03 DIAGNOSIS — C61 Malignant neoplasm of prostate: Secondary | ICD-10-CM | POA: Diagnosis not present

## 2022-11-03 DIAGNOSIS — C7951 Secondary malignant neoplasm of bone: Secondary | ICD-10-CM | POA: Diagnosis not present

## 2022-11-03 DIAGNOSIS — I130 Hypertensive heart and chronic kidney disease with heart failure and stage 1 through stage 4 chronic kidney disease, or unspecified chronic kidney disease: Secondary | ICD-10-CM | POA: Diagnosis not present

## 2022-11-03 DIAGNOSIS — N183 Chronic kidney disease, stage 3 unspecified: Secondary | ICD-10-CM | POA: Diagnosis not present

## 2022-11-04 ENCOUNTER — Inpatient Hospital Stay: Payer: Medicare Other | Admitting: Hematology

## 2022-11-04 DIAGNOSIS — C7951 Secondary malignant neoplasm of bone: Secondary | ICD-10-CM | POA: Diagnosis not present

## 2022-11-04 DIAGNOSIS — C61 Malignant neoplasm of prostate: Secondary | ICD-10-CM | POA: Diagnosis not present

## 2022-11-04 DIAGNOSIS — N183 Chronic kidney disease, stage 3 unspecified: Secondary | ICD-10-CM | POA: Diagnosis not present

## 2022-11-04 DIAGNOSIS — I5042 Chronic combined systolic (congestive) and diastolic (congestive) heart failure: Secondary | ICD-10-CM | POA: Diagnosis not present

## 2022-11-04 DIAGNOSIS — I130 Hypertensive heart and chronic kidney disease with heart failure and stage 1 through stage 4 chronic kidney disease, or unspecified chronic kidney disease: Secondary | ICD-10-CM | POA: Diagnosis not present

## 2022-11-04 DIAGNOSIS — E1122 Type 2 diabetes mellitus with diabetic chronic kidney disease: Secondary | ICD-10-CM | POA: Diagnosis not present

## 2022-11-07 DIAGNOSIS — N183 Chronic kidney disease, stage 3 unspecified: Secondary | ICD-10-CM | POA: Diagnosis not present

## 2022-11-07 DIAGNOSIS — E1122 Type 2 diabetes mellitus with diabetic chronic kidney disease: Secondary | ICD-10-CM | POA: Diagnosis not present

## 2022-11-07 DIAGNOSIS — C61 Malignant neoplasm of prostate: Secondary | ICD-10-CM | POA: Diagnosis not present

## 2022-11-07 DIAGNOSIS — C7951 Secondary malignant neoplasm of bone: Secondary | ICD-10-CM | POA: Diagnosis not present

## 2022-11-07 DIAGNOSIS — I130 Hypertensive heart and chronic kidney disease with heart failure and stage 1 through stage 4 chronic kidney disease, or unspecified chronic kidney disease: Secondary | ICD-10-CM | POA: Diagnosis not present

## 2022-11-07 DIAGNOSIS — I5042 Chronic combined systolic (congestive) and diastolic (congestive) heart failure: Secondary | ICD-10-CM | POA: Diagnosis not present

## 2022-11-08 ENCOUNTER — Other Ambulatory Visit: Payer: Self-pay

## 2022-11-08 ENCOUNTER — Inpatient Hospital Stay: Payer: Medicare Other

## 2022-11-08 ENCOUNTER — Other Ambulatory Visit: Payer: Self-pay | Admitting: Hematology

## 2022-11-08 VITALS — BP 94/52 | HR 79 | Temp 97.5°F | Resp 18

## 2022-11-08 DIAGNOSIS — D649 Anemia, unspecified: Secondary | ICD-10-CM

## 2022-11-08 DIAGNOSIS — E559 Vitamin D deficiency, unspecified: Secondary | ICD-10-CM

## 2022-11-08 DIAGNOSIS — C61 Malignant neoplasm of prostate: Secondary | ICD-10-CM

## 2022-11-08 DIAGNOSIS — Z5111 Encounter for antineoplastic chemotherapy: Secondary | ICD-10-CM | POA: Diagnosis not present

## 2022-11-08 DIAGNOSIS — C7951 Secondary malignant neoplasm of bone: Secondary | ICD-10-CM | POA: Diagnosis not present

## 2022-11-08 LAB — CBC WITH DIFFERENTIAL (CANCER CENTER ONLY)
Abs Immature Granulocytes: 0.05 10*3/uL (ref 0.00–0.07)
Basophils Absolute: 0 10*3/uL (ref 0.0–0.1)
Basophils Relative: 0 %
Eosinophils Absolute: 0.2 10*3/uL (ref 0.0–0.5)
Eosinophils Relative: 2 %
HCT: 33.6 % — ABNORMAL LOW (ref 39.0–52.0)
Hemoglobin: 11.1 g/dL — ABNORMAL LOW (ref 13.0–17.0)
Immature Granulocytes: 1 %
Lymphocytes Relative: 9 %
Lymphs Abs: 1 10*3/uL (ref 0.7–4.0)
MCH: 28.9 pg (ref 26.0–34.0)
MCHC: 33 g/dL (ref 30.0–36.0)
MCV: 87.5 fL (ref 80.0–100.0)
Monocytes Absolute: 0.6 10*3/uL (ref 0.1–1.0)
Monocytes Relative: 6 %
Neutro Abs: 8.6 10*3/uL — ABNORMAL HIGH (ref 1.7–7.7)
Neutrophils Relative %: 82 %
Platelet Count: 148 10*3/uL — ABNORMAL LOW (ref 150–400)
RBC: 3.84 MIL/uL — ABNORMAL LOW (ref 4.22–5.81)
RDW: 15.7 % — ABNORMAL HIGH (ref 11.5–15.5)
WBC Count: 10.5 10*3/uL (ref 4.0–10.5)
nRBC: 0 % (ref 0.0–0.2)

## 2022-11-08 LAB — CMP (CANCER CENTER ONLY)
ALT: 28 U/L (ref 0–44)
AST: 23 U/L (ref 15–41)
Albumin: 3 g/dL — ABNORMAL LOW (ref 3.5–5.0)
Alkaline Phosphatase: 139 U/L — ABNORMAL HIGH (ref 38–126)
Anion gap: 6 (ref 5–15)
BUN: 22 mg/dL (ref 8–23)
CO2: 26 mmol/L (ref 22–32)
Calcium: 8.1 mg/dL — ABNORMAL LOW (ref 8.9–10.3)
Chloride: 106 mmol/L (ref 98–111)
Creatinine: 1.32 mg/dL — ABNORMAL HIGH (ref 0.61–1.24)
GFR, Estimated: 51 mL/min — ABNORMAL LOW (ref >60)
Glucose, Bld: 142 mg/dL — ABNORMAL HIGH (ref 70–99)
Potassium: 4.3 mmol/L (ref 3.5–5.1)
Sodium: 138 mmol/L (ref 135–145)
Total Bilirubin: 0.4 mg/dL (ref 0.3–1.2)
Total Protein: 5.2 g/dL — ABNORMAL LOW (ref 6.5–8.1)

## 2022-11-08 LAB — VITAMIN D 25 HYDROXY (VIT D DEFICIENCY, FRACTURES): Vit D, 25-Hydroxy: 43.33 ng/mL (ref 30–100)

## 2022-11-08 MED ORDER — LEUPROLIDE ACETATE 7.5 MG ~~LOC~~ KIT
7.5000 mg | PACK | Freq: Once | SUBCUTANEOUS | Status: AC
  Administered 2022-11-08: 7.5 mg via SUBCUTANEOUS
  Filled 2022-11-08: qty 7.5

## 2022-11-08 NOTE — Patient Instructions (Signed)
Leuprolide Suspension for Injection (Prostate Cancer) What is this medication? LEUPROLIDE (loo PROE lide) reduces the symptoms of prostate cancer. It works by decreasing levels of the hormone testosterone in the body. This prevents prostate cancer cells from spreading or growing. This medicine may be used for other purposes; ask your health care provider or pharmacist if you have questions. COMMON BRAND NAME(S): Eligard, Lupron Depot, Lupron Depot-Ped, Lutrate Depot, Viadur What should I tell my care team before I take this medication? They need to know if you have any of these conditions: Diabetes Heart disease Heart failure High or low levels of electrolytes, such as magnesium, potassium, or sodium in your blood Irregular heartbeat or rhythm Seizures An unusual or allergic reaction to leuprolide, other medications, foods, dyes, or preservatives Pregnant or trying to get pregnant Breast-feeding How should I use this medication? This medication is injected under the skin or into a muscle. It is given by your care team in a hospital or clinic setting. Talk to your care team about the use of this medication in children. Special care may be needed. Overdosage: If you think you have taken too much of this medicine contact a poison control center or emergency room at once. NOTE: This medicine is only for you. Do not share this medicine with others. What if I miss a dose? Keep appointments for follow-up doses. It is important not to miss your dose. Call your care team if you are unable to keep an appointment. What may interact with this medication? Do not take this medication with any of the following: Cisapride Dronedarone Ketoconazole Levoketoconazole Pimozide Thioridazine This medication may also interact with the following: Other medications that cause heart rhythm changes This list may not describe all possible interactions. Give your health care provider a list of all the medicines,  herbs, non-prescription drugs, or dietary supplements you use. Also tell them if you smoke, drink alcohol, or use illegal drugs. Some items may interact with your medicine. What should I watch for while using this medication? Visit your care team for regular checks on your progress. Tell your care team if your symptoms do not start to get better or if they get worse. This medication may increase blood sugar. The risk may be higher in patients who already have diabetes. Ask your care team what you can do to lower the risk of diabetes while taking this medication. This medication may cause infertility. Talk to your care team if you are concerned about your fertility. Heart attacks and strokes have been reported with the use of this medication. Get emergency help if you develop signs or symptoms of a heart attack or stroke. Talk to your care team about the risks and benefits of this medication. What side effects may I notice from receiving this medication? Side effects that you should report to your care team as soon as possible: Allergic reactions--skin rash, itching, hives, swelling of the face, lips, tongue, or throat Heart attack--pain or tightness in the chest, shoulders, arms, or jaw, nausea, shortness of breath, cold or clammy skin, feeling faint or lightheaded Heart rhythm changes--fast or irregular heartbeat, dizziness, feeling faint or lightheaded, chest pain, trouble breathing High blood sugar (hyperglycemia)--increased thirst or amount of urine, unusual weakness or fatigue, blurry vision Mood swings, irritability, hostility Seizures Stroke--sudden numbness or weakness of the face, arm, or leg, trouble speaking, confusion, trouble walking, loss of balance or coordination, dizziness, severe headache, change in vision Thoughts of suicide or self-harm, worsening mood, feelings of depression Side   effects that usually do not require medical attention (report to your care team if they continue or  are bothersome): Bone pain Change in sex drive or performance General discomfort and fatigue Hot flashes Muscle pain Pain, redness, or irritation at injection site Swelling of the ankles, hands, or feet This list may not describe all possible side effects. Call your doctor for medical advice about side effects. You may report side effects to FDA at 1-800-FDA-1088. Where should I keep my medication? This medication is given in a hospital or clinic. It will not be stored at home. NOTE: This sheet is a summary. It may not cover all possible information. If you have questions about this medicine, talk to your doctor, pharmacist, or health care provider.  2023 Elsevier/Gold Standard (2021-10-25 00:00:00)  

## 2022-11-09 DIAGNOSIS — I130 Hypertensive heart and chronic kidney disease with heart failure and stage 1 through stage 4 chronic kidney disease, or unspecified chronic kidney disease: Secondary | ICD-10-CM | POA: Diagnosis not present

## 2022-11-09 DIAGNOSIS — C61 Malignant neoplasm of prostate: Secondary | ICD-10-CM | POA: Diagnosis not present

## 2022-11-09 DIAGNOSIS — C7951 Secondary malignant neoplasm of bone: Secondary | ICD-10-CM | POA: Diagnosis not present

## 2022-11-09 DIAGNOSIS — E1122 Type 2 diabetes mellitus with diabetic chronic kidney disease: Secondary | ICD-10-CM | POA: Diagnosis not present

## 2022-11-09 DIAGNOSIS — N183 Chronic kidney disease, stage 3 unspecified: Secondary | ICD-10-CM | POA: Diagnosis not present

## 2022-11-09 DIAGNOSIS — I5042 Chronic combined systolic (congestive) and diastolic (congestive) heart failure: Secondary | ICD-10-CM | POA: Diagnosis not present

## 2022-11-10 DIAGNOSIS — E1122 Type 2 diabetes mellitus with diabetic chronic kidney disease: Secondary | ICD-10-CM | POA: Diagnosis not present

## 2022-11-10 DIAGNOSIS — N183 Chronic kidney disease, stage 3 unspecified: Secondary | ICD-10-CM | POA: Diagnosis not present

## 2022-11-10 DIAGNOSIS — C61 Malignant neoplasm of prostate: Secondary | ICD-10-CM | POA: Diagnosis not present

## 2022-11-10 DIAGNOSIS — C7951 Secondary malignant neoplasm of bone: Secondary | ICD-10-CM | POA: Diagnosis not present

## 2022-11-10 DIAGNOSIS — I130 Hypertensive heart and chronic kidney disease with heart failure and stage 1 through stage 4 chronic kidney disease, or unspecified chronic kidney disease: Secondary | ICD-10-CM | POA: Diagnosis not present

## 2022-11-10 DIAGNOSIS — I5042 Chronic combined systolic (congestive) and diastolic (congestive) heart failure: Secondary | ICD-10-CM | POA: Diagnosis not present

## 2022-11-10 LAB — PSA, TOTAL AND FREE
PSA, Free Pct: >53.6 %
PSA, Free: >50 ng/mL
Prostate Specific Ag, Serum: 93.2 ng/mL — ABNORMAL HIGH (ref 0.0–4.0)

## 2022-11-14 ENCOUNTER — Ambulatory Visit (HOSPITAL_COMMUNITY)
Admission: RE | Admit: 2022-11-14 | Discharge: 2022-11-14 | Disposition: A | Discharge status: Home / Self Care | Payer: Medicare Other | Source: Ambulatory Visit | Attending: Hematology | Admitting: Hematology

## 2022-11-14 ENCOUNTER — Other Ambulatory Visit: Payer: Self-pay

## 2022-11-14 DIAGNOSIS — C7951 Secondary malignant neoplasm of bone: Secondary | ICD-10-CM

## 2022-11-14 DIAGNOSIS — C61 Malignant neoplasm of prostate: Secondary | ICD-10-CM | POA: Diagnosis not present

## 2022-11-14 DIAGNOSIS — N183 Chronic kidney disease, stage 3 unspecified: Secondary | ICD-10-CM | POA: Diagnosis not present

## 2022-11-14 DIAGNOSIS — I130 Hypertensive heart and chronic kidney disease with heart failure and stage 1 through stage 4 chronic kidney disease, or unspecified chronic kidney disease: Secondary | ICD-10-CM | POA: Diagnosis not present

## 2022-11-14 DIAGNOSIS — I5042 Chronic combined systolic (congestive) and diastolic (congestive) heart failure: Secondary | ICD-10-CM | POA: Diagnosis not present

## 2022-11-14 DIAGNOSIS — E1122 Type 2 diabetes mellitus with diabetic chronic kidney disease: Secondary | ICD-10-CM | POA: Diagnosis not present

## 2022-11-14 MED ORDER — PIFLIFOLASTAT F 18 (PYLARIFY) INJECTION
9.0000 | Freq: Once | INTRAVENOUS | Status: AC
  Administered 2022-11-14: 9.05 via INTRAVENOUS

## 2022-11-15 ENCOUNTER — Inpatient Hospital Stay (HOSPITAL_BASED_OUTPATIENT_CLINIC_OR_DEPARTMENT_OTHER): Payer: Medicare Other | Admitting: Hematology

## 2022-11-15 ENCOUNTER — Inpatient Hospital Stay: Payer: Medicare Other

## 2022-11-15 ENCOUNTER — Telehealth: Payer: Self-pay | Admitting: Hematology

## 2022-11-15 ENCOUNTER — Other Ambulatory Visit: Payer: Self-pay

## 2022-11-15 VITALS — BP 103/74 | HR 101 | Temp 97.8°F | Resp 17 | Ht 69.0 in | Wt 165.4 lb

## 2022-11-15 DIAGNOSIS — I5042 Chronic combined systolic (congestive) and diastolic (congestive) heart failure: Secondary | ICD-10-CM | POA: Diagnosis not present

## 2022-11-15 DIAGNOSIS — C61 Malignant neoplasm of prostate: Secondary | ICD-10-CM

## 2022-11-15 DIAGNOSIS — Z7189 Other specified counseling: Secondary | ICD-10-CM

## 2022-11-15 DIAGNOSIS — E1122 Type 2 diabetes mellitus with diabetic chronic kidney disease: Secondary | ICD-10-CM | POA: Diagnosis not present

## 2022-11-15 DIAGNOSIS — I130 Hypertensive heart and chronic kidney disease with heart failure and stage 1 through stage 4 chronic kidney disease, or unspecified chronic kidney disease: Secondary | ICD-10-CM | POA: Diagnosis not present

## 2022-11-15 DIAGNOSIS — C7951 Secondary malignant neoplasm of bone: Secondary | ICD-10-CM

## 2022-11-15 DIAGNOSIS — Z5111 Encounter for antineoplastic chemotherapy: Secondary | ICD-10-CM | POA: Diagnosis not present

## 2022-11-15 DIAGNOSIS — N183 Chronic kidney disease, stage 3 unspecified: Secondary | ICD-10-CM | POA: Diagnosis not present

## 2022-11-15 LAB — CMP (CANCER CENTER ONLY)
ALT: 31 U/L (ref 0–44)
AST: 26 U/L (ref 15–41)
Albumin: 3 g/dL — ABNORMAL LOW (ref 3.5–5.0)
Alkaline Phosphatase: 149 U/L — ABNORMAL HIGH (ref 38–126)
Anion gap: 8 (ref 5–15)
BUN: 20 mg/dL (ref 8–23)
CO2: 30 mmol/L (ref 22–32)
Calcium: 8.1 mg/dL — ABNORMAL LOW (ref 8.9–10.3)
Chloride: 100 mmol/L (ref 98–111)
Creatinine: 1.51 mg/dL — ABNORMAL HIGH (ref 0.61–1.24)
GFR, Estimated: 43 mL/min — ABNORMAL LOW (ref >60)
Glucose, Bld: 384 mg/dL — ABNORMAL HIGH (ref 70–99)
Potassium: 4 mmol/L (ref 3.5–5.1)
Sodium: 138 mmol/L (ref 135–145)
Total Bilirubin: 0.6 mg/dL (ref 0.3–1.2)
Total Protein: 5.3 g/dL — ABNORMAL LOW (ref 6.5–8.1)

## 2022-11-15 LAB — CBC WITH DIFFERENTIAL (CANCER CENTER ONLY)
Abs Immature Granulocytes: 0.04 10*3/uL (ref 0.00–0.07)
Basophils Absolute: 0 10*3/uL (ref 0.0–0.1)
Basophils Relative: 0 %
Eosinophils Absolute: 0 10*3/uL (ref 0.0–0.5)
Eosinophils Relative: 0 %
HCT: 35.7 % — ABNORMAL LOW (ref 39.0–52.0)
Hemoglobin: 11.6 g/dL — ABNORMAL LOW (ref 13.0–17.0)
Immature Granulocytes: 0 %
Lymphocytes Relative: 14 %
Lymphs Abs: 1.3 10*3/uL (ref 0.7–4.0)
MCH: 28.8 pg (ref 26.0–34.0)
MCHC: 32.5 g/dL (ref 30.0–36.0)
MCV: 88.6 fL (ref 80.0–100.0)
Monocytes Absolute: 0.3 10*3/uL (ref 0.1–1.0)
Monocytes Relative: 4 %
Neutro Abs: 7.6 10*3/uL (ref 1.7–7.7)
Neutrophils Relative %: 82 %
Platelet Count: 114 10*3/uL — ABNORMAL LOW (ref 150–400)
RBC: 4.03 MIL/uL — ABNORMAL LOW (ref 4.22–5.81)
RDW: 15.8 % — ABNORMAL HIGH (ref 11.5–15.5)
WBC Count: 9.3 10*3/uL (ref 4.0–10.5)
nRBC: 0 % (ref 0.0–0.2)

## 2022-11-15 NOTE — Telephone Encounter (Signed)
Called patient per 3/19 IB to confirm appointments. Patient notified and all questions were answered.

## 2022-11-15 NOTE — Progress Notes (Signed)
HEMATOLOGY/ONCOLOGY CLINIC NOTE  Date of Service: 11/15/2022  Patient Care Team: [name], MD as PCP - General [place] Lack Charlann Lange, MD as PCP - Cardiology (Cardiology) Szabat, Darnelle Maffucci, [facility] (Inactive) as Pharmacist (Pharmacist) Sherlynn Stalls, MD as Consulting Physician (Ophthalmology)  CHIEF COMPLAINTS/PURPOSE OF CONSULTATION:  L3 spinal metastasis from poorly differentiated prostate cancer   HISTORY OF PRESENTING ILLNESS:  Nicholas Burton is a wonderful 89 y.o. male who has been referred to [place] by Dr Laverna Peace MD for evaluation and management of metastatic poorly differentiated prostate cancer with oligometastatic disease to the L3.   He has a history of hypertension, chronic diastolic heart failure, dyslipidemia, diabetes type 2, dementia and was admitted to [facility] with acute encephalopathy 10/05/2022.   Patient had a CT of the head which showed no acute intracranial findings CT of the lumbar spine was done which showed a lytic destructive pattern involving L2-L3 and L4 vertebral bodies concerning for possible discitis versus osteomyelitis versus tumor.   MRI lumbar spine on 10/05/2022 showed Findings concerning for osteomyelitis discitis at the L3-4 level. Abnormal enhancing material within the ventral epidural space at the level of L3, concerning for epidural phlegmon. These changes superimposed on underlying spondylosis and facet arthrosis resultant severe spinal stenosis. While these changes are favored to reflect infection, a possible neoplastic process involving the L3 vertebral body remains difficult to exclude. Jobst interval follow-up imaging may be helpful for further evaluation as warranted.  This was treated with empiric antibiotics and is being followed by infectious disease. Patient subsequently underwent a biopsy of L3 10/07/2022 which showed metastatic poorly differentiated prostate cancer.   Patient subsequently had a CT of the chest abdomen  pelvis without contrast at our recommendation which was done on 10/11/2022 and showed no evidence of metastatic disease in the chest abdomen or pelvis.  Diffuse bladder wall thickening with surrounding inflammatory stranding worrisome for cystitis.  Stable abnormal appearance of L3 and nonspecific presacral edema.  INTERVAL HISTORY  Nicholas Burton is a wonderful 89 y.o. male who is here for continued evaluation and management of metastatic poorly differentiated prostate cancer with oligometastatic disease to the L3.   Patient was last seen by me on 10/27/2022 and he complained of lower back pain, left lower extremity weakness, hearing loss.   Patient has accompanied by his son during this visit. He reports he has been doing well overall. He notes he tolerated his Leuprolide injection well without any toxicities.   Patient notes that his back pain has improved since our last visit. He denies any other pain.   Patient has partial dentures which are now lose due to weight loss.   He denies fever, chills, night sweats, infection issues, abdominal pain, chest pain, or leg swelling.    MEDICAL HISTORY:  Past Medical History:  Diagnosis Date   Allergy    ANEMIA ASSOCIATED W/OTHER [facility] NUTRITIONAL DEFIC 08/11/2010   Qualifier: Diagnosis of  By: Ronnald Ramp MD, [name].    Cancer [facility])    skin   Cataract    Chronic combined systolic and diastolic congestive heart failure ([place]) 10/30/2018   COPD with asthma 03/02/2017   CRI (chronic renal insufficiency), stage 3 (moderate) (HCC) 10/25/2018   Deficiency anemia 10/25/2018   Diabetes mellitus    type 2   DOE (dyspnea on exertion) 09/03/2018   Edema 06/11/2009   Qualifier: Diagnosis of  By: Ronnald Ramp MD, [name].    History of skin cancer  Hyperlipidemia with target LDL less than 100 11/10/2008        Hypertension    Memory loss 05/14/2009   Obesity (BMI 30.0-34.9) 04/01/2013   Osteoarthritis    PSA elevation 10/25/2018   SKIN CANCER, HX OF 11/10/2008    Qualifier: Diagnosis of  By: Ronnald Ramp MD, [name].    Snoring 04/03/2014   Type II diabetes mellitus with manifestations ([place]) 11/10/2008   Estimated Creatinine Clearance: 38 mL/min (A) (by C-G formula based on SCr of 1.52 mg/dL (H)).    Venous stasis dermatitis of both lower extremities 01/29/2018   Vitamin D deficiency 11/10/2008    SURGICAL HISTORY: Past Surgical History:  Procedure Laterality Date   CHOLECYSTECTOMY     INGUINAL HERNIA REPAIR     x 3   IR FLUORO GUIDED NEEDLE PLC ASPIRATION/INJECTION LOC  10/07/2022   JOINT REPLACEMENT     KNEE SURGERY     x 2   MELANOMA EXCISION     x 3 -- Left arm   TOTAL KNEE ARTHROPLASTY     x 3    SOCIAL HISTORY: Social History   Socioeconomic History   Marital status: Married    Spouse name: Not on file   Number of children: 4   Years of education: Not on file   Highest education level: Not on file  Occupational History   Occupation: retired    Fish farm manager: RETIRED  Tobacco Use   Smoking status: Never   Smokeless tobacco: Never  Vaping Use   Vaping Use: Never used  Substance and Sexual Activity   Alcohol use: No   Drug use: No   Sexual activity: Yes    Birth control/protection: None  Other Topics Concern   Not on file  Social History Narrative   No regular exercise   Social Determinants of Health   Financial Resource Strain: Low Risk  (07/11/2022)   Overall Financial Resource Strain (CARDIA)    Difficulty of Paying Living Expenses: Not hard at all  Food Insecurity: No Food Insecurity (10/27/2022)   Hunger Vital Sign    Worried About Running Out of Food in the Last Year: Never true    [place] in the Last Year: Never true  Transportation Needs: No Transportation Needs (10/27/2022)   PRAPARE - Hydrologist (Medical): No    Lack of Transportation (Non-Medical): No  Physical Activity: Inactive (07/09/2021)   Exercise Vital Sign    Days of Exercise per Week: 0 days    Minutes of Exercise per  Session: 0 min  Stress: No Stress Concern Present (07/11/2022)   [place]    Feeling of Stress : Not at all  Social Connections: Moderately Isolated (07/11/2022)   Social Connection and Isolation Panel [NHANES]    Frequency of Communication with Friends and Family: Twice a week    Frequency of Social Gatherings with Friends and Family: Twice a week    Attends Religious Services: Never    Marine scientist or Organizations: No    Attends Archivist Meetings: Never    Marital Status: Married  Human resources officer Violence: Not At Risk (10/27/2022)   Humiliation, Afraid, Rape, and Kick questionnaire    Fear of Current or Ex-Partner: No    Emotionally Abused: No    Physically Abused: No    Sexually Abused: No    FAMILY HISTORY: Family History  Problem Relation Age of Onset  Heart disease Mother    Diabetes Mother    Heart disease Father    Cancer Sister    Cancer Brother    Diabetes Son    Diabetes Other     ALLERGIES:  is allergic to ace inhibitors, oxycodone, and penicillins.  MEDICATIONS:  Current Outpatient Medications  Medication Sig Dispense Refill   acetaminophen (TYLENOL) 325 MG tablet Take 1-2 tablets (325-650 mg total) by mouth every 6 (six) hours as needed for mild pain.     atorvastatin (LIPITOR) 10 MG tablet Take 1 tablet (10 mg total) by mouth daily. 30 tablet 0   cholecalciferol (VITAMIN D) 1000 units tablet Take 1 tablet (1,000 Units total) by mouth daily. 30 tablet 0   ciprofloxacin-dexamethasone (CIPRODEX) OTIC suspension Instill 4 drops to RIGHT ear twice a day for 7 days 7.5 mL 0   Colesevelam HCl 3.75 g PACK MIX AND DRINK 1 PACKET DAILY (Patient taking differently: Take 1 packet by mouth daily.) 90 packet 0   cyanocobalamin (VITAMIN B12) 500 MCG tablet Take 1 tablet (500 mcg total) by mouth daily. 30 tablet 0   dexamethasone (DECADRON) 4 MG tablet Take 1 tablet (4 mg total) by  mouth daily. 30 tablet 0   empagliflozin (JARDIANCE) 10 MG TABS tablet Take 1 tablet (10 mg total) by mouth daily. Take 1/2 tablet by mouth daily 30 tablet 0   HYDROcodone-acetaminophen (NORCO/VICODIN) 5-325 MG tablet Take 1-2 tablets by mouth every 4 (four) hours as needed for moderate pain. 30 tablet 0   insulin glargine (LANTUS) 100 UNIT/ML Solostar Pen Inject 12 Units into the skin 2 (two) times daily. 15 mL 11   lidocaine (LIDODERM) 5 % Place 1 patch onto the skin daily. Remove & Discard patch within 12 hours or as directed by MD 30 patch 0   magnesium gluconate (MAGONATE) 500 MG tablet Take 1 tablet (500 mg total) by mouth at bedtime. 30 tablet 0   melatonin 3 MG TABS tablet Take 1 tablet (3 mg total) by mouth at bedtime as needed. 30 tablet 0   metoprolol tartrate (LOPRESSOR) 25 MG tablet TAKE ONE-HALF (1/2) TABLET TWICE A DAY 60 tablet 0   Multiple Vitamins-Minerals (PRESERVISION/LUTEIN) CAPS Take 1 capsule by mouth 2 (two) times daily.     tamsulosin (FLOMAX) 0.4 MG CAPS capsule Take 1 capsule (0.4 mg total) by mouth daily. 30 capsule 0   torsemide (DEMADEX) 10 MG tablet Take 1 tablet (10 mg total) by mouth every other day. (Patient taking differently: Take 10 mg by mouth every other day. 1 tablet PO QOD) 30 tablet 1   Vibegron (GEMTESA) 75 MG TABS TAKE 1 TABLET DAILY (Patient taking differently: Take 75 mg by mouth daily.) 90 tablet 1   Vitamin D, Ergocalciferol, (DRISDOL) 1.25 MG (50000 UNIT) CAPS capsule Take 1 capsule (50,000 Units total) by mouth every 7 (seven) days. 5 capsule 0   No current facility-administered medications for this visit.    REVIEW OF SYSTEMS:    10 Point review of Systems was done is negative except as noted above.  PHYSICAL EXAMINATION: ECOG PERFORMANCE STATUS: 2 - Symptomatic, <50% confined to bed  . Vitals:   11/15/22 1333  BP: 103/74  Pulse: (!) 101  Resp: 17  Temp: 97.8 F (36.6 C)  SpO2: 97%    Filed Weights   11/15/22 1333  Weight: 165 lb  6.4 oz (75 kg)    .Body mass index is 24.43 kg/m.  GENERAL:alert, in no acute distress and comfortable SKIN:  no acute rashes, no significant lesions EYES: conjunctiva are pink and non-injected, sclera anicteric OROPHARYNX: MMM, no exudates, no oropharyngeal erythema or ulceration NECK: supple, no JVD LYMPH:  no palpable lymphadenopathy in the cervical, axillary or inguinal regions LUNGS: clear to auscultation b/l with normal respiratory effort HEART: regular rate & rhythm ABDOMEN:  normoactive bowel sounds , non tender, not distended. Extremity: no pedal edema PSYCH: alert & oriented x 3 with fluent speech NEURO: no focal motor/sensory deficits  LABORATORY DATA:  I have reviewed the data as listed  .    Latest Ref Rng & Units 11/08/2022    1:16 PM 10/19/2022    5:32 AM 10/17/2022    6:11 AM  CBC  WBC 4.0 - 10.5 K/uL 10.5  13.7  15.3   Hemoglobin 13.0 - 17.0 g/dL 11.1  11.3  10.7   Hematocrit 39.0 - 52.0 % 33.6  34.5  32.9   Platelets 150 - 400 K/uL 148  358  362     .    Latest Ref Rng & Units 11/08/2022    1:16 PM 10/17/2022    6:11 AM 10/13/2022    4:28 AM  CMP  Glucose 70 - 99 mg/dL 142  90  100   BUN 8 - 23 mg/dL 22  25  17    Creatinine 0.61 - 1.24 mg/dL 1.32  1.54  1.53   Sodium 135 - 145 mmol/L 138  138  138   Potassium 3.5 - 5.1 mmol/L 4.3  3.5  3.6   Chloride 98 - 111 mmol/L 106  103  105   CO2 22 - 32 mmol/L 26  24  23    Calcium 8.9 - 10.3 mg/dL 8.1  8.3  8.1   Total Protein 6.5 - 8.1 g/dL 5.2  4.9  5.2   Total Bilirubin 0.3 - 1.2 mg/dL 0.4  0.5  0.3   Alkaline Phos 38 - 126 U/L 139  137  115   AST 15 - 41 U/L 23  37  33   ALT 0 - 44 U/L 28  47  35      RADIOGRAPHIC STUDIES: I have personally reviewed the radiological images as listed and agreed with the findings in the report. No results found.  ASSESSMENT & PLAN:   89 year old male with no previous history of malignancy.  Previous history of hypertension, dyslipidemia, diabetes, CHF dementia though  alert oriented x 3 at baseline admitted with altered mental status which is now more or less cleared significant new back pain.   #1 Pathologic L3 compression due to bone metastases from metastatic poorly differentiated prostate cancer.  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 Cannot rule out additional infection.  No cultures were sent from his L3 biopsy. PSA levels are elevated at 55.1 CT chest abdomen pelvis did not show any other sites of overt metastatic disease at this time or overt prostate lesions.   #2 history of dementia though apparently alert oriented x 3 at baseline with poor cognition.   #3 hypertension, dyslipidemia, diabetes, CHF.  PLAN: -Discussed lab results from today, 11/15/2022, with the patient and his son. CBC shows decreased hemoglobin of 11.6, hematocrit of 35.7, and decreased platelets at 114 K. CMP is pending. PSA levels from 11/08/2022 showed PSA level of 93.2.  -Showed the patient and his son the PET scan image. Official report shows Enlarged prostate with intense radiotracer activity throughout, consistent with primary prostate carcinoma. Extracapsular extension and bilateral seminal  vesicle involvement are demonstrated. Mild bilateral pelvic lymphadenopathy shows radiotracer accumulation, consistent with metastatic disease.  Diffuse osseous metastatic disease throughout the axial and appendicular skeleton. -Switching to Lupron every 3 months with next month.  -Discussed the option of 1. Leuprolide shot every 3 months and observe. 2. adding Xtandi treatment with bone strengthening medication called Xgeva or Zometa once a month.  -educated the patient and his son on side effects of Xtandi.  -educated the patient on side effects of Xgeva and Zometa.  -discussed that we will not be able to start Xgeva or Zometa shot until dental clearance.  -Patient agrees with option 3 Xtandi, but not Xgeva or Zometa until  dental clearance.  -Patient treatment plan would be: Xtandi with Leuprolide shots every 3 months. Xgeva or Zometa shot after dental clearance.  -will start Enzalutamide at 80mg  po daily and adjust dose per tolerance.  FOLLOW-UP: Switching to Lupron every 3 months starting 12/09/2022-plz schedule next 4 doses RTC with Dr Irene Limbo in 2 months with labs for toxicity check with Gillermina Phy   The total time spent in the appointment was 32 minutes* .  All of the patient's questions were answered with apparent satisfaction. The patient knows to call the clinic with any problems, questions or concerns.   Sullivan Lone MD MS AAHIVMS [facility] Hematology/Oncology Physician [facility]  .*Total Encounter Time as defined by the Centers for Medicare and Medicaid Services includes, in addition to the face-to-face time of a patient visit (documented in the note above) non-face-to-face time: obtaining and reviewing outside history, ordering and reviewing medications, tests or procedures, care coordination (communications with other health care professionals or caregivers) and documentation in the medical record.   I, Cleda Mccreedy, am acting as a Education administrator for Sullivan Lone, MD.  .I have reviewed the above documentation for accuracy and completeness, and I agree with the above. Brunetta Genera MD

## 2022-11-17 ENCOUNTER — Telehealth: Payer: Self-pay | Admitting: Internal Medicine

## 2022-11-17 DIAGNOSIS — C61 Malignant neoplasm of prostate: Secondary | ICD-10-CM | POA: Diagnosis not present

## 2022-11-17 DIAGNOSIS — I5042 Chronic combined systolic (congestive) and diastolic (congestive) heart failure: Secondary | ICD-10-CM | POA: Diagnosis not present

## 2022-11-17 DIAGNOSIS — N183 Chronic kidney disease, stage 3 unspecified: Secondary | ICD-10-CM | POA: Diagnosis not present

## 2022-11-17 DIAGNOSIS — I130 Hypertensive heart and chronic kidney disease with heart failure and stage 1 through stage 4 chronic kidney disease, or unspecified chronic kidney disease: Secondary | ICD-10-CM | POA: Diagnosis not present

## 2022-11-17 DIAGNOSIS — C7951 Secondary malignant neoplasm of bone: Secondary | ICD-10-CM | POA: Diagnosis not present

## 2022-11-17 DIAGNOSIS — E1122 Type 2 diabetes mellitus with diabetic chronic kidney disease: Secondary | ICD-10-CM | POA: Diagnosis not present

## 2022-11-17 NOTE — Telephone Encounter (Signed)
Prescription Request  11/17/2022  LOV: 09/21/2022  What is the name of the medication or equipment? dexamethasone (DECADRON) 4 MG tablet  tamsulosin (FLOMAX) 0.4 MG CAPS capsule  Vitamin D, Ergocalciferol, (DRISDOL) 1.25 MG (50000 UNIT) CAPS capsule  cholecalciferol (VITAMIN D) 1000 units tablet   Have you contacted your pharmacy to request a refill? No   Which pharmacy would you like this sent to?  [facility] [place], [address] [place] [address] Phone: [phone] Fax: [phone]   Patient notified that their request is being sent to the clinical staff for review and that they should receive a response within 2 business days.   Please advise at [place]

## 2022-11-18 ENCOUNTER — Other Ambulatory Visit: Payer: Self-pay | Admitting: Internal Medicine

## 2022-11-18 DIAGNOSIS — I130 Hypertensive heart and chronic kidney disease with heart failure and stage 1 through stage 4 chronic kidney disease, or unspecified chronic kidney disease: Secondary | ICD-10-CM | POA: Diagnosis not present

## 2022-11-18 DIAGNOSIS — M545 Low back pain, unspecified: Secondary | ICD-10-CM

## 2022-11-18 DIAGNOSIS — N400 Enlarged prostate without lower urinary tract symptoms: Secondary | ICD-10-CM

## 2022-11-18 DIAGNOSIS — C61 Malignant neoplasm of prostate: Secondary | ICD-10-CM | POA: Diagnosis not present

## 2022-11-18 DIAGNOSIS — C7951 Secondary malignant neoplasm of bone: Secondary | ICD-10-CM | POA: Diagnosis not present

## 2022-11-18 DIAGNOSIS — N183 Chronic kidney disease, stage 3 unspecified: Secondary | ICD-10-CM | POA: Diagnosis not present

## 2022-11-18 DIAGNOSIS — E1122 Type 2 diabetes mellitus with diabetic chronic kidney disease: Secondary | ICD-10-CM | POA: Diagnosis not present

## 2022-11-18 DIAGNOSIS — I5042 Chronic combined systolic (congestive) and diastolic (congestive) heart failure: Secondary | ICD-10-CM | POA: Diagnosis not present

## 2022-11-18 DIAGNOSIS — R338 Other retention of urine: Secondary | ICD-10-CM | POA: Diagnosis not present

## 2022-11-18 MED ORDER — DEXAMETHASONE 4 MG PO TABS: 4.0000 mg | ORAL_TABLET | Freq: Every day | ORAL | 0 refills | Status: DC

## 2022-11-18 MED ORDER — TAMSULOSIN HCL 0.4 MG PO CAPS: 0.4000 mg | ORAL_CAPSULE | Freq: Every day | ORAL | 0 refills | Status: DC

## 2022-11-18 NOTE — Telephone Encounter (Signed)
Pls advise on supplement. Flomax was filled by  Cathlyn Parsons, PA-C .Marland KitchenJohny Burton

## 2022-11-21 ENCOUNTER — Other Ambulatory Visit: Payer: Self-pay

## 2022-11-21 ENCOUNTER — Encounter: Payer: Self-pay | Admitting: Hematology

## 2022-11-21 ENCOUNTER — Telehealth: Payer: Self-pay | Admitting: Hematology

## 2022-11-21 ENCOUNTER — Telehealth: Payer: Self-pay | Admitting: Pharmacy Technician

## 2022-11-21 ENCOUNTER — Telehealth: Payer: Self-pay | Admitting: Pharmacist

## 2022-11-21 ENCOUNTER — Other Ambulatory Visit (HOSPITAL_COMMUNITY): Payer: Self-pay

## 2022-11-21 ENCOUNTER — Other Ambulatory Visit: Payer: Self-pay | Admitting: Internal Medicine

## 2022-11-21 DIAGNOSIS — E1122 Type 2 diabetes mellitus with diabetic chronic kidney disease: Secondary | ICD-10-CM | POA: Diagnosis not present

## 2022-11-21 DIAGNOSIS — Z794 Long term (current) use of insulin: Secondary | ICD-10-CM

## 2022-11-21 DIAGNOSIS — C61 Malignant neoplasm of prostate: Secondary | ICD-10-CM

## 2022-11-21 DIAGNOSIS — C7951 Secondary malignant neoplasm of bone: Secondary | ICD-10-CM | POA: Diagnosis not present

## 2022-11-21 DIAGNOSIS — I130 Hypertensive heart and chronic kidney disease with heart failure and stage 1 through stage 4 chronic kidney disease, or unspecified chronic kidney disease: Secondary | ICD-10-CM | POA: Diagnosis not present

## 2022-11-21 DIAGNOSIS — I5042 Chronic combined systolic (congestive) and diastolic (congestive) heart failure: Secondary | ICD-10-CM | POA: Diagnosis not present

## 2022-11-21 DIAGNOSIS — N183 Chronic kidney disease, stage 3 unspecified: Secondary | ICD-10-CM | POA: Diagnosis not present

## 2022-11-21 DIAGNOSIS — E785 Hyperlipidemia, unspecified: Secondary | ICD-10-CM

## 2022-11-21 MED ORDER — ENZALUTAMIDE 80 MG PO TABS
80.0000 mg | ORAL_TABLET | Freq: Every day | ORAL | 1 refills | Status: DC
  Filled 2022-11-21: qty 60, 60d supply, fill #0
  Filled 2022-11-22: qty 30, 30d supply, fill #0
  Filled 2022-12-14 – 2022-12-26 (×2): qty 30, 30d supply, fill #1

## 2022-11-21 MED ORDER — ENZALUTAMIDE 80 MG PO TABS
80.0000 mg | ORAL_TABLET | Freq: Every day | ORAL | 1 refills | Status: DC
  Filled 2022-11-21: qty 60, 60d supply, fill #0

## 2022-11-21 NOTE — Telephone Encounter (Signed)
Oral Oncology Patient Advocate Encounter   Received notification that prior authorization for Gillermina Phy is required.   PA submitted on 11/21/22 Key [id number] Status is pending     Lady Deutscher, CPhT-Adv Oncology Pharmacy Patient [place] Direct Number: [phone]  Fax: [phone]

## 2022-11-21 NOTE — Telephone Encounter (Signed)
Per 3/25 IB reached out to patients wife to schedule.

## 2022-11-21 NOTE — Telephone Encounter (Signed)
Oral Oncology Patient Advocate Encounter  Prior Authorization for [facility] has been approved.    PA# [id number] Effective dates: 11/21/22 through 08/28/98  Patients co-pay is $16.    Lady Deutscher, [place] Patient [place] Direct Number: [phone]  Fax: [phone]

## 2022-11-21 NOTE — Telephone Encounter (Signed)
Oral Oncology Pharmacist Encounter  Received new prescription for Xtandi (enzalutamide) for the treatment of metastatic castration sensitive prostate cancer in conjunction with ADT, planned duration until disease progression or unacceptable drug toxicity.  CBC w/ Diff and CMP from 11/15/22 assessed, noted patient Scr of 1.51 mg/dL (CrCl ~ 33.1 mL/min) - no renal dose adjustments for Xtandi required. PSA on 11/08/22 93.2 ng/mL. Prescription dose and frequency assessed for appropriateness. Per MD note patient starting on dose reduction of Xtandi at 80 mg/d, and will be increased as tolerated.   Current medication list in Epic reviewed, DDIs with Xtandi identified: Category D drug-drug interaction between Xtandi and Dexamethasone - Xtandi may decrease dexamethasone concentrations/efficacy. Dose of dexamethasone may need to be increased per MD discretion if patient is going to be on this long term in combination with Xtandi. Category C drug-drug interaction between Xtandi and Atorvastatin - Xtandi, a strong CYP3A4 inducer may decrease serum concentrations of atorvastatin. No change in therapy warranted at this time, but atorvastatin dose may need to be increased in the future based on response.  Category C drug-drug interaction between Xtandi and Hydrocodone/APAP - Xtandi, a strong CYP3A4 inducer may decrease serum concentrations of Hydrocodone/APAP. Recommend monitoring patient's pain levels and for issues with breakthrough pain and adjusting as needed. No change in therapy warranted at this time.  Category C drug-drug interaction between Xtandi and Torsemide - Xtandi, a moderate CYP2C9 inducer may decrease serum concentrations of torsemide. Recommend monitoring for decreased torsemide efficacy and adjusting dose as needed. No change in therapy warranted at this time.   Evaluated chart and no patient barriers to medication adherence noted.   Patient agreement for treatment documented in MD note on  11/15/22.  Prescription has been e-scribed to the [facility] for benefits analysis and approval.  Oral Oncology Clinic will continue to follow for insurance authorization, copayment issues, initial counseling and start date.  Leron Croak, PharmD, BCPS, BCOP Hematology/Oncology Clinical Pharmacist [facility] [place] [phone] 11/21/2022 10:09 AM

## 2022-11-22 ENCOUNTER — Other Ambulatory Visit (HOSPITAL_COMMUNITY): Payer: Self-pay

## 2022-11-22 ENCOUNTER — Other Ambulatory Visit: Payer: Self-pay

## 2022-11-22 DIAGNOSIS — R338 Other retention of urine: Secondary | ICD-10-CM | POA: Diagnosis not present

## 2022-11-22 NOTE — Telephone Encounter (Signed)
Oral Chemotherapy Pharmacist Encounter  I spoke with patient's wife, Nicholas Burton, for overview of: Nicholas Burton (enzalutamide) for the treatment of metastatic castration sensitive prostate cancer in conjunction with ADT, planned duration until disease progression or unacceptable drug toxicity.   Counseled on administration, dosing, side effects, monitoring, drug-food interactions, safe handling, storage, and disposal. Reviewed safe handling of Xtandi with patient's wife as she is the one who helps her husband with his medications.  Patient will take Xtandi 80mg  tablets, 1 tablet (80mg  total) by mouth once daily without regard to food.  Patient's wife knows that patient needs to avoid grapefruit/grapefruit juice, seville oranges and star fruit while on Xtandi.   Xtandi start date: 11/25/22  Adverse effects include but are not limited to: peripheral edema, GI upset, hypertension, hot flashes, fatigue, and arthralgias.    Reviewed importance of keeping a medication schedule and plan for any missed doses. No barriers to medication adherence identified.  Medication reconciliation performed and medication/allergy list updated.  All questions answered.  Ms. Gaw voiced understanding and appreciation.   Medication education handout placed in mail for patient and patient's wife. Patient's wife knows to call the office with questions or concerns. Oral Chemotherapy Clinic phone number provided.   Leron Croak, PharmD, BCPS, [facility] Hematology/Oncology Clinical Pharmacist [facility] [place] [phone] 11/22/2022 9:44 AM

## 2022-11-23 ENCOUNTER — Other Ambulatory Visit (HOSPITAL_COMMUNITY): Payer: Self-pay

## 2022-11-23 DIAGNOSIS — Z6824 Body mass index (BMI) 24.0-24.9, adult: Secondary | ICD-10-CM | POA: Diagnosis not present

## 2022-11-23 DIAGNOSIS — D508 Other iron deficiency anemias: Secondary | ICD-10-CM | POA: Diagnosis not present

## 2022-11-23 DIAGNOSIS — G9341 Metabolic encephalopathy: Secondary | ICD-10-CM | POA: Diagnosis not present

## 2022-11-23 DIAGNOSIS — Z466 Encounter for fitting and adjustment of urinary device: Secondary | ICD-10-CM | POA: Diagnosis not present

## 2022-11-23 DIAGNOSIS — J4489 Other specified chronic obstructive pulmonary disease: Secondary | ICD-10-CM | POA: Diagnosis not present

## 2022-11-23 DIAGNOSIS — E538 Deficiency of other specified B group vitamins: Secondary | ICD-10-CM | POA: Diagnosis not present

## 2022-11-23 DIAGNOSIS — E1122 Type 2 diabetes mellitus with diabetic chronic kidney disease: Secondary | ICD-10-CM | POA: Diagnosis not present

## 2022-11-23 DIAGNOSIS — G301 Alzheimer's disease with late onset: Secondary | ICD-10-CM | POA: Diagnosis not present

## 2022-11-23 DIAGNOSIS — Z9181 History of falling: Secondary | ICD-10-CM | POA: Diagnosis not present

## 2022-11-23 DIAGNOSIS — C7951 Secondary malignant neoplasm of bone: Secondary | ICD-10-CM | POA: Diagnosis not present

## 2022-11-23 DIAGNOSIS — I872 Venous insufficiency (chronic) (peripheral): Secondary | ICD-10-CM | POA: Diagnosis not present

## 2022-11-23 DIAGNOSIS — R338 Other retention of urine: Secondary | ICD-10-CM | POA: Diagnosis not present

## 2022-11-23 DIAGNOSIS — I5042 Chronic combined systolic (congestive) and diastolic (congestive) heart failure: Secondary | ICD-10-CM | POA: Diagnosis not present

## 2022-11-23 DIAGNOSIS — N183 Chronic kidney disease, stage 3 unspecified: Secondary | ICD-10-CM | POA: Diagnosis not present

## 2022-11-23 DIAGNOSIS — N401 Enlarged prostate with lower urinary tract symptoms: Secondary | ICD-10-CM | POA: Diagnosis not present

## 2022-11-23 DIAGNOSIS — Z794 Long term (current) use of insulin: Secondary | ICD-10-CM | POA: Diagnosis not present

## 2022-11-23 DIAGNOSIS — M48062 Spinal stenosis, lumbar region with neurogenic claudication: Secondary | ICD-10-CM | POA: Diagnosis not present

## 2022-11-23 DIAGNOSIS — J441 Chronic obstructive pulmonary disease with (acute) exacerbation: Secondary | ICD-10-CM | POA: Diagnosis not present

## 2022-11-23 DIAGNOSIS — C61 Malignant neoplasm of prostate: Secondary | ICD-10-CM | POA: Diagnosis not present

## 2022-11-23 DIAGNOSIS — F0280 Dementia in other diseases classified elsewhere without behavioral disturbance: Secondary | ICD-10-CM | POA: Diagnosis not present

## 2022-11-23 DIAGNOSIS — N179 Acute kidney failure, unspecified: Secondary | ICD-10-CM | POA: Diagnosis not present

## 2022-11-23 DIAGNOSIS — Z7984 Long term (current) use of oral hypoglycemic drugs: Secondary | ICD-10-CM | POA: Diagnosis not present

## 2022-11-23 DIAGNOSIS — E669 Obesity, unspecified: Secondary | ICD-10-CM | POA: Diagnosis not present

## 2022-11-23 DIAGNOSIS — I130 Hypertensive heart and chronic kidney disease with heart failure and stage 1 through stage 4 chronic kidney disease, or unspecified chronic kidney disease: Secondary | ICD-10-CM | POA: Diagnosis not present

## 2022-11-23 DIAGNOSIS — E876 Hypokalemia: Secondary | ICD-10-CM | POA: Diagnosis not present

## 2022-11-24 ENCOUNTER — Encounter: Payer: Self-pay | Admitting: Internal Medicine

## 2022-11-24 ENCOUNTER — Ambulatory Visit (INDEPENDENT_AMBULATORY_CARE_PROVIDER_SITE_OTHER): Payer: Medicare Other | Admitting: Internal Medicine

## 2022-11-24 ENCOUNTER — Telehealth: Payer: Self-pay | Admitting: Internal Medicine

## 2022-11-24 ENCOUNTER — Telehealth: Payer: Self-pay | Admitting: Interventional Cardiology

## 2022-11-24 VITALS — BP 112/62 | HR 88 | Temp 98.5°F | Ht 69.0 in

## 2022-11-24 DIAGNOSIS — R9431 Abnormal electrocardiogram [ECG] [EKG]: Secondary | ICD-10-CM

## 2022-11-24 DIAGNOSIS — R072 Precordial pain: Secondary | ICD-10-CM | POA: Insufficient documentation

## 2022-11-24 DIAGNOSIS — R600 Localized edema: Secondary | ICD-10-CM

## 2022-11-24 DIAGNOSIS — C7951 Secondary malignant neoplasm of bone: Secondary | ICD-10-CM

## 2022-11-24 DIAGNOSIS — C61 Malignant neoplasm of prostate: Secondary | ICD-10-CM | POA: Diagnosis not present

## 2022-11-24 DIAGNOSIS — I5042 Chronic combined systolic (congestive) and diastolic (congestive) heart failure: Secondary | ICD-10-CM

## 2022-11-24 NOTE — Telephone Encounter (Signed)
I spoke with patient's son and gave him message from Dr Irish Lack.  Son would like to proceed with echo.  Son reports patient had episode of chest pain/indigestion last night.  He is asking if patient should go to ED.  I advised him to follow recommendations given by PCP at today's appointment.  Son reports patient does not want to go to hospital due to his other health issues.  If patient's symptoms worsen son will take patient to ED.

## 2022-11-24 NOTE — Progress Notes (Signed)
Subjective:  Patient ID: Nicholas Burton, male    DOB: [birth year]  Age: 89 y.o. MRN: [id number]  CC: Anemia and Chest Pain   HPI Nicholas Burton presents for f/up ---  He complains of a 1 day history of diffuse anterior chest pain with a sensation that his heart was going to stop.  He was seen yesterday at the [facility] and a chest x-ray was done and he was prescribed Zithromax.  Outpatient Medications Prior to Visit  Medication Sig Dispense Refill   acetaminophen (TYLENOL) 325 MG tablet Take 1-2 tablets (325-650 mg total) by mouth every 6 (six) hours as needed for mild pain.     atorvastatin (LIPITOR) 10 MG tablet Take 1 tablet (10 mg total) by mouth daily. 30 tablet 0   cholecalciferol (VITAMIN D) 1000 units tablet Take 1 tablet (1,000 Units total) by mouth daily. 30 tablet 0   ciprofloxacin-dexamethasone (CIPRODEX) OTIC suspension Instill 4 drops to RIGHT ear twice a day for 7 days 7.5 mL 0   Colesevelam HCl 3.75 g PACK Take 1 packet by mouth daily. 9 packet 1   cyanocobalamin (VITAMIN B12) 500 MCG tablet Take 1 tablet (500 mcg total) by mouth daily. 30 tablet 0   dexamethasone (DECADRON) 4 MG tablet Take 1 tablet (4 mg total) by mouth daily. 30 tablet 0   empagliflozin (JARDIANCE) 10 MG TABS tablet Take 1 tablet (10 mg total) by mouth daily. Take 1/2 tablet by mouth daily 30 tablet 0   enzalutamide (XTANDI) 80 MG tablet Take 1 tablet (80 mg total) by mouth daily. 30 tablet 1   HYDROcodone-acetaminophen (NORCO/VICODIN) 5-325 MG tablet Take 1-2 tablets by mouth every 4 (four) hours as needed for moderate pain. 30 tablet 0   insulin glargine (LANTUS) 100 UNIT/ML Solostar Pen Inject 12 Units into the skin 2 (two) times daily. 15 mL 11   lidocaine (LIDODERM) 5 % Place 1 patch onto the skin daily. Remove & Discard patch within 12 hours or as directed by MD 30 patch 0   magnesium gluconate (MAGONATE) 500 MG tablet Take 1 tablet (500 mg total) by mouth at bedtime. 30 tablet 0   melatonin 3 MG TABS tablet  Take 1 tablet (3 mg total) by mouth at bedtime as needed. 30 tablet 0   metoprolol tartrate (LOPRESSOR) 25 MG tablet TAKE ONE-HALF (1/2) TABLET TWICE A DAY 60 tablet 0   Multiple Vitamins-Minerals (PRESERVISION/LUTEIN) CAPS Take 1 capsule by mouth 2 (two) times daily.     tamsulosin (FLOMAX) 0.4 MG CAPS capsule Take 1 capsule (0.4 mg total) by mouth daily. 90 capsule 0   torsemide (DEMADEX) 10 MG tablet Take 1 tablet (10 mg total) by mouth every other day. (Patient taking differently: Take 10 mg by mouth every other day. 1 tablet PO QOD) 30 tablet 1   Vibegron (GEMTESA) 75 MG TABS TAKE 1 TABLET DAILY (Patient taking differently: Take 75 mg by mouth daily.) 90 tablet 1   Vitamin D, Ergocalciferol, (DRISDOL) 1.25 MG (50000 UNIT) CAPS capsule Take 1 capsule (50,000 Units total) by mouth every 7 (seven) days. 5 capsule 0   No facility-administered medications prior to visit.    ROS Review of Systems  Constitutional:  Positive for fatigue. Negative for chills, diaphoresis and unexpected weight change.  HENT: Negative.    Eyes: Negative.   Respiratory:  Positive for shortness of breath. Negative for cough, chest tightness and wheezing.   Cardiovascular:  Positive for chest pain and leg swelling. Negative for  palpitations.  Gastrointestinal: Negative.  Negative for abdominal pain, diarrhea, nausea and vomiting.  Endocrine: Negative.   Genitourinary: Negative.  Negative for difficulty urinating.  Musculoskeletal:  Positive for back pain.  Skin:  Positive for pallor. Negative for color change and rash.  Neurological:  Positive for weakness.  Psychiatric/Behavioral:  Positive for confusion. Negative for agitation and behavioral problems.     Objective:  BP 112/62 (BP Location: Right Arm, Patient Position: Sitting, Cuff Size: Large)   Pulse 88   Temp 98.5 F (36.9 C) (Oral)   Ht 5\' 9"  (1.753 m)   SpO2 95%   BMI 24.43 kg/m   BP Readings from Last 3 Encounters:  11/24/22 112/62  11/15/22  103/74  11/08/22 (!) 94/52    Wt Readings from Last 3 Encounters:  11/15/22 165 lb 6.4 oz (75 kg)  10/27/22 163 lb 6.4 oz (74.1 kg)  10/14/22 169 lb 15.6 oz (77.1 kg)    Physical Exam Vitals reviewed.  Constitutional:      Appearance: Normal appearance. He is ill-appearing.  HENT:     Nose: Nose normal.     Mouth/Throat:     Mouth: Mucous membranes are moist. Mucous membranes are pale.  Eyes:     General: No scleral icterus.    Conjunctiva/sclera: Conjunctivae normal.  Cardiovascular:     Rate and Rhythm: Normal rate and regular rhythm.     Pulses: Normal pulses.     Heart sounds: No murmur heard.    No friction rub. No gallop.     Comments: EKG- NSR, 89 bpm LVH RBBB, LAFB, septal infarct pattern - new No acute ST/T wave changes Pulmonary:     Effort: Pulmonary effort is normal.     Breath sounds: No stridor. No wheezing, rhonchi or rales.  Abdominal:     General: Abdomen is flat.     Palpations: There is no mass.     Tenderness: There is no abdominal tenderness. There is no guarding.     Hernia: No hernia is present.  Musculoskeletal:     Cervical back: Neck supple.     Right lower leg: 1+ Pitting Edema present.     Left lower leg: 1+ Pitting Edema present.  Lymphadenopathy:     Cervical: No cervical adenopathy.  Skin:    General: Skin is warm and dry.     Coloration: Skin is pale.  Neurological:     General: No focal deficit present.     Mental Status: He is alert.  Psychiatric:        Attention and Perception: Perception normal. He is inattentive.        Mood and Affect: Mood normal.        Speech: Speech is delayed and tangential.        Behavior: Behavior is cooperative.        Cognition and Memory: Cognition is impaired. Memory is impaired.     Lab Results  Component Value Date   WBC 9.3 11/15/2022   HGB 11.6 (L) 11/15/2022   HCT 35.7 (L) 11/15/2022   PLT 114 (L) 11/15/2022   GLUCOSE 384 (H) 11/15/2022   CHOL 83 09/17/2019   TRIG 136.0  09/17/2019   HDL 36.40 (L) 09/17/2019   LDLCALC 19 09/17/2019   ALT 31 11/15/2022   AST 26 11/15/2022   NA 138 11/15/2022   K 4.0 11/15/2022   CL 100 11/15/2022   CREATININE 1.51 (H) 11/15/2022   BUN 20 11/15/2022   CO2 30 11/15/2022  TSH 3.821 10/06/2022   PSA 11.21 (H) 10/30/2017   INR 1.1 10/07/2022   HGBA1C 8.0 (H) 07/26/2022   MICROALBUR 0.7 09/17/2019    NM PET (PSMA) SKULL TO MID THIGH  Result Date: 11/15/2022 CLINICAL DATA:  Newly diagnosed high-risk prostate carcinoma. Staging. L3 compression fracture. EXAM: NUCLEAR MEDICINE PET SKULL BASE TO THIGH TECHNIQUE: 9.0 mCi F18 Piflufolastat (Pylarify) was injected intravenously. Full-ring PET imaging was performed from the skull base to thigh after the radiotracer. CT data was obtained and used for attenuation correction and anatomic localization. COMPARISON:  CT on 10/11/2022 FINDINGS: NECK No radiotracer activity in neck lymph nodes. Incidental CT finding: None. CHEST No radiotracer accumulation within mediastinal or hilar lymph nodes. No suspicious pulmonary nodules on the CT scan. Incidental CT finding: None. ABDOMEN/PELVIS Prostate: Mild-to-moderately enlarged prostate gland shows intense radiotracer activity throughout, with extracapsular extension seen posteriorly and superiorly, with involvement of the seminal vesicles. SUV max measures 114.0. Lymph nodes: 2 left external iliac lymph nodes are seen which show radiotracer accumulation, largest measuring 6 mm on image 179/4, with SUV max of 34.0. A 10 mm right internal iliac lymph node on image 22/4 shows radiotracer accumulation, with SUV max of 5.7. Two lymph nodes are seen in the right anterior pelvis which measure 12 mm on image 186/4 and 6 mm on image 179/4. Both show radiotracer accumulation with SUV max of 32.5. No abnormal radiotracer accumulation seen within abdominal lymph nodes. Liver: No evidence of liver metastasis. Incidental CT finding: Prior cholecystectomy. Aortic  atherosclerotic calcification incidentally noted. Colonic diverticulosis noted, without evidence of diverticulitis. SKELETON Innumerable foci of intense radiotracer activity are seen throughout the axial and appendicular skeleton in the neck, chest, abdomen, and pelvis, consistent with diffuse osseous metastatic disease. IMPRESSION: Enlarged prostate with intense radiotracer activity throughout, consistent with primary prostate carcinoma. Extracapsular extension and bilateral seminal vesicle involvement are demonstrated. Mild bilateral pelvic lymphadenopathy shows radiotracer accumulation, consistent with metastatic disease. Diffuse osseous metastatic disease throughout the axial and appendicular skeleton. Aortic Atherosclerosis (ICD10-I70.0). Electronically Signed   By: Marlaine Hind M.D.   On: 11/15/2022 15:51    Assessment & Plan:    Precordial pain- I discussed my assessment with the patient and his granddaughter.  I informed them that I think that he has had a myocardial infarction.  The patient told us that he does not want to be transferred to the hospital.  The granddaughter spoke to the patient's daughter-in-law who is a Marine scientist - I spoke to her on the phone and she asked me what I would do.  I informed them that if it was left up to me he would go to the ED to be evaluated for acute coronary syndrome.  The patient's granddaughter and daughter-in-law want him to go home so that they can think about his treatment options.  I informed them to go to [facility] urgently if they decide that he wants to be evaluated. -     EKG 12-Lead  Bilateral leg edema- This is c/w a recent cardiac event.  Prostate cancer [facility])  Prostate cancer metastatic to bone [facility]) - Will continue palliative care.     Follow-up: No follow-ups on file.  Scarlette Calico, MD

## 2022-11-24 NOTE — Telephone Encounter (Signed)
Patient's son states the patient was seen by his PCP today and they did an EKG in the office. He says they were told it showed the patient had a small heart attack recently. He says he is having the office send over the notes for Dr. Beau Fanny to review and he would like a call back to discuss them. Phone: [phone]

## 2022-11-24 NOTE — Telephone Encounter (Signed)
I have not seen the ECG yet but if there was a change, best test to do would be an echo to see if there was a change in LV function.

## 2022-11-24 NOTE — Telephone Encounter (Signed)
Patient's son called and said that a copy of his dad's EKG needs to be sent to His Heart Doctor - Doctor Irish Lack - Phone #:  [phone]

## 2022-11-25 DIAGNOSIS — I5042 Chronic combined systolic (congestive) and diastolic (congestive) heart failure: Secondary | ICD-10-CM | POA: Diagnosis not present

## 2022-11-25 DIAGNOSIS — N183 Chronic kidney disease, stage 3 unspecified: Secondary | ICD-10-CM | POA: Diagnosis not present

## 2022-11-25 DIAGNOSIS — C61 Malignant neoplasm of prostate: Secondary | ICD-10-CM | POA: Diagnosis not present

## 2022-11-25 DIAGNOSIS — C7951 Secondary malignant neoplasm of bone: Secondary | ICD-10-CM | POA: Diagnosis not present

## 2022-11-25 DIAGNOSIS — I130 Hypertensive heart and chronic kidney disease with heart failure and stage 1 through stage 4 chronic kidney disease, or unspecified chronic kidney disease: Secondary | ICD-10-CM | POA: Diagnosis not present

## 2022-11-25 DIAGNOSIS — E1122 Type 2 diabetes mellitus with diabetic chronic kidney disease: Secondary | ICD-10-CM | POA: Diagnosis not present

## 2022-11-28 ENCOUNTER — Telehealth: Payer: Self-pay | Admitting: Internal Medicine

## 2022-11-28 ENCOUNTER — Other Ambulatory Visit: Payer: Self-pay

## 2022-11-28 ENCOUNTER — Emergency Department (HOSPITAL_COMMUNITY)
Admission: EM | Admit: 2022-11-28 | Discharge: 2022-11-28 | Disposition: A | Discharge status: Home / Self Care | Payer: Medicare Other | Attending: Emergency Medicine | Admitting: Emergency Medicine

## 2022-11-28 ENCOUNTER — Emergency Department (HOSPITAL_COMMUNITY): Payer: Medicare Other

## 2022-11-28 ENCOUNTER — Encounter (HOSPITAL_COMMUNITY): Payer: Self-pay | Admitting: Radiology

## 2022-11-28 DIAGNOSIS — R0789 Other chest pain: Secondary | ICD-10-CM | POA: Diagnosis not present

## 2022-11-28 DIAGNOSIS — E119 Type 2 diabetes mellitus without complications: Secondary | ICD-10-CM | POA: Diagnosis not present

## 2022-11-28 DIAGNOSIS — J449 Chronic obstructive pulmonary disease, unspecified: Secondary | ICD-10-CM | POA: Diagnosis not present

## 2022-11-28 DIAGNOSIS — I110 Hypertensive heart disease with heart failure: Secondary | ICD-10-CM | POA: Diagnosis not present

## 2022-11-28 DIAGNOSIS — C7951 Secondary malignant neoplasm of bone: Secondary | ICD-10-CM | POA: Diagnosis not present

## 2022-11-28 DIAGNOSIS — E876 Hypokalemia: Secondary | ICD-10-CM | POA: Diagnosis not present

## 2022-11-28 DIAGNOSIS — R739 Hyperglycemia, unspecified: Secondary | ICD-10-CM | POA: Diagnosis not present

## 2022-11-28 DIAGNOSIS — Z794 Long term (current) use of insulin: Secondary | ICD-10-CM | POA: Diagnosis not present

## 2022-11-28 DIAGNOSIS — R079 Chest pain, unspecified: Secondary | ICD-10-CM | POA: Insufficient documentation

## 2022-11-28 DIAGNOSIS — I5042 Chronic combined systolic (congestive) and diastolic (congestive) heart failure: Secondary | ICD-10-CM | POA: Diagnosis not present

## 2022-11-28 DIAGNOSIS — R7309 Other abnormal glucose: Secondary | ICD-10-CM

## 2022-11-28 DIAGNOSIS — E1122 Type 2 diabetes mellitus with diabetic chronic kidney disease: Secondary | ICD-10-CM | POA: Diagnosis not present

## 2022-11-28 DIAGNOSIS — C61 Malignant neoplasm of prostate: Secondary | ICD-10-CM | POA: Diagnosis not present

## 2022-11-28 DIAGNOSIS — N183 Chronic kidney disease, stage 3 unspecified: Secondary | ICD-10-CM | POA: Diagnosis not present

## 2022-11-28 DIAGNOSIS — Z96659 Presence of unspecified artificial knee joint: Secondary | ICD-10-CM | POA: Diagnosis not present

## 2022-11-28 DIAGNOSIS — I130 Hypertensive heart and chronic kidney disease with heart failure and stage 1 through stage 4 chronic kidney disease, or unspecified chronic kidney disease: Secondary | ICD-10-CM | POA: Diagnosis not present

## 2022-11-28 DIAGNOSIS — R9431 Abnormal electrocardiogram [ECG] [EKG]: Secondary | ICD-10-CM | POA: Diagnosis not present

## 2022-11-28 LAB — COMPREHENSIVE METABOLIC PANEL
ALT: 28 U/L (ref 0–44)
AST: 21 U/L (ref 15–41)
Albumin: 2.3 g/dL — ABNORMAL LOW (ref 3.5–5.0)
Alkaline Phosphatase: 228 U/L — ABNORMAL HIGH (ref 38–126)
Anion gap: 9 (ref 5–15)
BUN: 17 mg/dL (ref 8–23)
CO2: 26 mmol/L (ref 22–32)
Calcium: 7.1 mg/dL — ABNORMAL LOW (ref 8.9–10.3)
Chloride: 98 mmol/L (ref 98–111)
Creatinine, Ser: 1.3 mg/dL — ABNORMAL HIGH (ref 0.61–1.24)
GFR, Estimated: 52 mL/min — ABNORMAL LOW (ref >60)
Glucose, Bld: 424 mg/dL — ABNORMAL HIGH (ref 70–99)
Potassium: 3.2 mmol/L — ABNORMAL LOW (ref 3.5–5.1)
Sodium: 133 mmol/L — ABNORMAL LOW (ref 135–145)
Total Bilirubin: 0.5 mg/dL (ref 0.3–1.2)
Total Protein: 4.7 g/dL — ABNORMAL LOW (ref 6.5–8.1)

## 2022-11-28 LAB — CBC WITH DIFFERENTIAL/PLATELET
Abs Immature Granulocytes: 0.07 10*3/uL (ref 0.00–0.07)
Basophils Absolute: 0 10*3/uL (ref 0.0–0.1)
Basophils Relative: 0 %
Eosinophils Absolute: 0 10*3/uL (ref 0.0–0.5)
Eosinophils Relative: 0 %
HCT: 32.3 % — ABNORMAL LOW (ref 39.0–52.0)
Hemoglobin: 10.6 g/dL — ABNORMAL LOW (ref 13.0–17.0)
Immature Granulocytes: 1 %
Lymphocytes Relative: 19 %
Lymphs Abs: 2 10*3/uL (ref 0.7–4.0)
MCH: 29 pg (ref 26.0–34.0)
MCHC: 32.8 g/dL (ref 30.0–36.0)
MCV: 88.3 fL (ref 80.0–100.0)
Monocytes Absolute: 0.6 10*3/uL (ref 0.1–1.0)
Monocytes Relative: 6 %
Neutro Abs: 7.6 10*3/uL (ref 1.7–7.7)
Neutrophils Relative %: 74 %
Platelets: 177 10*3/uL (ref 150–400)
RBC: 3.66 MIL/uL — ABNORMAL LOW (ref 4.22–5.81)
RDW: 15.2 % (ref 11.5–15.5)
WBC: 10.3 10*3/uL (ref 4.0–10.5)
nRBC: 0 % (ref 0.0–0.2)

## 2022-11-28 LAB — BRAIN NATRIURETIC PEPTIDE: B Natriuretic Peptide: 63.8 pg/mL (ref 0.0–100.0)

## 2022-11-28 LAB — TROPONIN I (HIGH SENSITIVITY)
Troponin I (High Sensitivity): 9 ng/L (ref <18)
Troponin I (High Sensitivity): 10 ng/L (ref <18)

## 2022-11-28 MED ORDER — SODIUM CHLORIDE 0.9 % IV BOLUS
500.0000 mL | Freq: Once | INTRAVENOUS | Status: AC
  Administered 2022-11-28: 500 mL via INTRAVENOUS

## 2022-11-28 MED ORDER — POTASSIUM CHLORIDE CRYS ER 20 MEQ PO TBCR
40.0000 meq | EXTENDED_RELEASE_TABLET | Freq: Once | ORAL | Status: AC
  Administered 2022-11-28: 40 meq via ORAL
  Filled 2022-11-28: qty 2

## 2022-11-28 MED ORDER — CALCIUM CARBONATE ANTACID 500 MG PO CHEW
400.0000 mg | CHEWABLE_TABLET | Freq: Once | ORAL | Status: AC
  Administered 2022-11-28: 400 mg via ORAL
  Filled 2022-11-28: qty 2

## 2022-11-28 NOTE — ED Triage Notes (Signed)
Pt began having chest pain on Friday. His home health nurse did and EKG and suggested he come to the ED. Pt refused. Chest pain started again today. Pt endorses starting a new chemo pill on Friday as well. Of not pt BS was 580.

## 2022-11-28 NOTE — Telephone Encounter (Signed)
Claiborne Billings a nurse from [facility] called requesting verbal orders for this patient to continue to receive [place] for 1 time a week for 4 weeks. Best callback number for Claiborne Billings is [phone].

## 2022-11-28 NOTE — ED Provider Notes (Signed)
[place] Provider Note  CSN: [id number] Arrival date & time: 11/28/22 1843  Chief Complaint(s) Chest Pain  HPI Nicholas Burton is a 89 y.o. male with past medical history as below, significant for DM, HLD, HTN, osteoarthritis, COPD who presents to the ED with complaint of chest pain.  Patient reports he had chest pain 3 days ago, EKG was done at that time which was abnormal per home health nurse was advised to come to the hospital for evaluation, he did not seek medical care at that time but family member advised him to come today which is why he presents at this point.  He has follow-up with cardiology on Wednesday for an echo per the son who is at bedside.  He is currently pain-free.  Pain described as midsternal, sharp and stabbing, has since resolved.  Had brief episode of dyspnea while the pain was starting but this has since resolved.  No ongoing dyspnea, abdominal pain, nausea or vomiting, no lightheadedness or syncope.  No recent diet changes, did recently start new medication for cancer.  No ongoing dyspnea or palpitations.  Denies history of DVT or PE  Past Medical History Past Medical History:  Diagnosis Date   Allergy    ANEMIA ASSOCIATED W/OTHER [facility] NUTRITIONAL DEFIC 08/11/2010   Qualifier: Diagnosis of  By: Ronnald Ramp MD, [name].    Cancer    skin   Cataract    Chronic combined systolic and diastolic congestive heart failure 10/30/2018   COPD with asthma 03/02/2017   CRI (chronic renal insufficiency), stage 3 (moderate) 10/25/2018   Deficiency anemia 10/25/2018   Diabetes mellitus    type 2   DOE (dyspnea on exertion) 09/03/2018   Edema 06/11/2009   Qualifier: Diagnosis of  By: Ronnald Ramp MD, [name].    History of skin cancer    Hyperlipidemia with target LDL less than 100 11/10/2008        Hypertension    Memory loss 05/14/2009   Obesity (BMI 30.0-34.9) 04/01/2013   Osteoarthritis    PSA elevation 10/25/2018   SKIN CANCER, HX OF 11/10/2008    Qualifier: Diagnosis of  By: Ronnald Ramp MD, [name].    Snoring 04/03/2014   Type II diabetes mellitus with manifestations 11/10/2008   Estimated Creatinine Clearance: 38 mL/min (A) (by C-G formula based on SCr of 1.52 mg/dL (H)).    Venous stasis dermatitis of both lower extremities 01/29/2018   Vitamin D deficiency 11/10/2008   Patient Active Problem List   Diagnosis Date Noted   Bilateral leg edema 11/24/2022   Precordial pain 11/24/2022   Prostate cancer metastatic to bone 10/13/2022   Counseling regarding advance care planning and goals of care 10/13/2022   Spinal stenosis of lumbar region with neurogenic claudication 09/21/2022   Encounter for palliative care involving management of pain 09/21/2022   Late onset Alzheimer's dementia without behavioral disturbance 09/21/2020   Iron deficiency anemia secondary to inadequate dietary iron intake 06/06/2019   Chronic diastolic CHF (congestive heart failure) 10/30/2018   CRI (chronic renal insufficiency), stage 3 (moderate) 10/25/2018   Venous stasis dermatitis of both lower extremities 01/29/2018   COPD with asthma 03/02/2017   Obesity (BMI 30.0-34.9) 04/01/2013   BPH (benign prostatic hyperplasia) 09/08/2011   B12 deficiency 08/13/2010   DM2 (diabetes mellitus, type 2) 11/10/2008   Vitamin D deficiency 11/10/2008   HLD (hyperlipidemia) 11/10/2008   Essential hypertension 11/10/2008   Osteoarthritis 11/10/2008   Home Medication(s) Prior to Admission  medications   Medication Sig Start Date End Date Taking? Authorizing Provider  acetaminophen (TYLENOL) 325 MG tablet Take 1-2 tablets (325-650 mg total) by mouth every 6 (six) hours as needed for mild pain. 10/21/22   Angiulli, Lavon Paganini, PA-C  atorvastatin (LIPITOR) 10 MG tablet Take 1 tablet (10 mg total) by mouth daily. 10/21/22   Angiulli, Lavon Paganini, PA-C  cholecalciferol (VITAMIN D) 1000 units tablet Take 1 tablet (1,000 Units total) by mouth daily. 10/21/22   Angiulli, Lavon Paganini, PA-C   ciprofloxacin-dexamethasone (CIPRODEX) OTIC suspension Instill 4 drops to RIGHT ear twice a day for 7 days 10/27/22   Ailene Ards, NP  Colesevelam HCl 3.75 g PACK Take 1 packet by mouth daily. 11/21/22   [name], MD  cyanocobalamin (VITAMIN B12) 500 MCG tablet Take 1 tablet (500 mcg total) by mouth daily. 10/21/22   Angiulli, Lavon Paganini, PA-C  dexamethasone (DECADRON) 4 MG tablet Take 1 tablet (4 mg total) by mouth daily. 11/18/22   [name], MD  empagliflozin (JARDIANCE) 10 MG TABS tablet Take 1 tablet (10 mg total) by mouth daily. Take 1/2 tablet by mouth daily 10/27/22   Ailene Ards, NP  enzalutamide Gillermina Phy) 80 MG tablet Take 1 tablet (80 mg total) by mouth daily. 11/21/22   Brunetta Genera, MD  HYDROcodone-acetaminophen (NORCO/VICODIN) 5-325 MG tablet Take 1-2 tablets by mouth every 4 (four) hours as needed for moderate pain. 10/21/22   Angiulli, Lavon Paganini, PA-C  insulin glargine (LANTUS) 100 UNIT/ML Solostar Pen Inject 12 Units into the skin 2 (two) times daily. 10/21/22   Angiulli, Lavon Paganini, PA-C  lidocaine (LIDODERM) 5 % Place 1 patch onto the skin daily. Remove & Discard patch within 12 hours or as directed by MD 10/21/22   Angiulli, Lavon Paganini, PA-C  magnesium gluconate (MAGONATE) 500 MG tablet Take 1 tablet (500 mg total) by mouth at bedtime. 10/21/22   Angiulli, Lavon Paganini, PA-C  melatonin 3 MG TABS tablet Take 1 tablet (3 mg total) by mouth at bedtime as needed. 10/21/22   Angiulli, Lavon Paganini, PA-C  metoprolol tartrate (LOPRESSOR) 25 MG tablet TAKE ONE-HALF (1/2) TABLET TWICE A DAY 10/21/22   Angiulli, Lavon Paganini, PA-C  Multiple Vitamins-Minerals (PRESERVISION/LUTEIN) CAPS Take 1 capsule by mouth 2 (two) times daily.    [provider]  tamsulosin (FLOMAX) 0.4 MG CAPS capsule Take 1 capsule (0.4 mg total) by mouth daily. 11/18/22   [name], MD  torsemide (DEMADEX) 10 MG tablet Take 1 tablet (10 mg total) by mouth every other day. Patient taking differently: Take 10 mg  by mouth every other day. 1 tablet PO QOD 10/27/22   Ailene Ards, NP  Vibegron (GEMTESA) 75 MG TABS TAKE 1 TABLET DAILY Patient taking differently: Take 75 mg by mouth daily. 07/25/22   [name], MD  Vitamin D, Ergocalciferol, (DRISDOL) 1.25 MG (50000 UNIT) CAPS capsule Take 1 capsule (50,000 Units total) by mouth every 7 (seven) days. 10/22/22   Angiulli, Lavon Paganini, PA-C  Past Surgical History Past Surgical History:  Procedure Laterality Date   CHOLECYSTECTOMY     INGUINAL HERNIA REPAIR     x 3   IR FLUORO GUIDED NEEDLE PLC ASPIRATION/INJECTION LOC  10/07/2022   JOINT REPLACEMENT     KNEE SURGERY     x 2   MELANOMA EXCISION     x 3 -- Left arm   TOTAL KNEE ARTHROPLASTY     x 3   Family History Family History  Problem Relation Age of Onset   Heart disease Mother    Diabetes Mother    Heart disease Father    Cancer Sister    Cancer Brother    Diabetes Son    Diabetes Other     Social History Social History   Tobacco Use   Smoking status: Never   Smokeless tobacco: Never  Vaping Use   Vaping Use: Never used  Substance Use Topics   Alcohol use: No   Drug use: No   Allergies Ace inhibitors, Oxycodone, and Penicillins  Review of Systems Review of Systems  Constitutional:  Negative for chills and fever.  HENT:  Negative for facial swelling and trouble swallowing.   Eyes:  Negative for photophobia and visual disturbance.  Respiratory:  Positive for shortness of breath. Negative for cough.   Cardiovascular:  Positive for chest pain. Negative for palpitations.  Gastrointestinal:  Negative for abdominal pain, nausea and vomiting.  Endocrine: Negative for polydipsia and polyuria.  Genitourinary:  Negative for difficulty urinating and hematuria.  Musculoskeletal:  Negative for gait problem and joint swelling.  Skin:  Negative for pallor  and rash.  Neurological:  Negative for syncope, numbness and headaches.  Psychiatric/Behavioral:  Negative for agitation and confusion.     Physical Exam Vital Signs  I have reviewed the triage vital signs BP 103/73 (BP Location: Right Arm)   Pulse 85   Temp 98.1 F (36.7 C) (Oral)   Resp 17   Ht 5\' 9"  (1.753 m)   Wt 74.8 kg   SpO2 98%   BMI 24.37 kg/m  Physical Exam Vitals and nursing note reviewed.  Constitutional:      General: He is not in acute distress.    Appearance: Normal appearance. He is well-developed.  HENT:     Head: Normocephalic and atraumatic.     Right Ear: External ear normal.     Left Ear: External ear normal.     Mouth/Throat:     Mouth: Mucous membranes are moist.  Eyes:     General: No scleral icterus. Cardiovascular:     Rate and Rhythm: Normal rate and regular rhythm.     Pulses: Normal pulses.     Heart sounds: Normal heart sounds, S1 normal and S2 normal.  Pulmonary:     Effort: Pulmonary effort is normal. No respiratory distress.     Breath sounds: Normal breath sounds.  Abdominal:     General: Abdomen is flat.     Palpations: Abdomen is soft.     Tenderness: There is no abdominal tenderness.  Musculoskeletal:     Right lower leg: No edema.     Left lower leg: No edema.  Skin:    General: Skin is warm and dry.     Capillary Refill: Capillary refill takes less than 2 seconds.  Neurological:     Mental Status: He is alert and oriented to person, place, and time.     GCS: GCS eye subscore is 4. GCS verbal subscore is  5. GCS motor subscore is 6.  Psychiatric:        Mood and Affect: Mood normal.        Behavior: Behavior normal.     ED Results and Treatments Labs (all labs ordered are listed, but only abnormal results are displayed) Labs Reviewed  CBC WITH DIFFERENTIAL/PLATELET - Abnormal; Notable for the following components:      Result Value   RBC 3.66 (*)    Hemoglobin 10.6 (*)    HCT 32.3 (*)    All other components within  normal limits  COMPREHENSIVE METABOLIC PANEL - Abnormal; Notable for the following components:   Sodium 133 (*)    Potassium 3.2 (*)    Glucose, Bld 424 (*)    Creatinine, Ser 1.30 (*)    Calcium 7.1 (*)    Total Protein 4.7 (*)    Albumin 2.3 (*)    Alkaline Phosphatase 228 (*)    GFR, Estimated 52 (*)    All other components within normal limits  BRAIN NATRIURETIC PEPTIDE  TROPONIN I (HIGH SENSITIVITY)  TROPONIN I (HIGH SENSITIVITY)                                                                                                                          Radiology DG Chest 2 View  Result Date: 11/28/2022 CLINICAL DATA:  Chest pain EXAM: CHEST - 2 VIEW COMPARISON:  10/05/2022 FINDINGS: Cardiac size is within normal limits. There are no signs of pulmonary edema or focal pulmonary consolidation. The patient's chin is partially obscuring the medial aspects of the apices. There is blunting of left lateral CP angle. There is no pneumothorax. IMPRESSION: There are no signs of pulmonary edema or focal pulmonary consolidation. Blunting of left lateral CP angle suggest small effusion or pleural thickening. Electronically Signed   By: Elmer Picker M.D.   On: 11/28/2022 20:00    Pertinent labs & imaging results that were available during my care of the patient were reviewed by me and considered in my medical decision making (see MDM for details).  Medications Ordered in ED Medications  potassium chloride SA (KLOR-CON M) CR tablet 40 mEq (40 mEq Oral Given 11/28/22 2107)  sodium chloride 0.9 % bolus 500 mL (500 mLs Intravenous New Bag/Given 11/28/22 2116)  calcium carbonate (TUMS - dosed in mg elemental calcium) chewable tablet 400 mg of elemental calcium (400 mg of elemental calcium Oral Given 11/28/22 2107)  Procedures Procedures  (including critical care  time)  Medical Decision Making / ED Course    Medical Decision Making:    STEFEN JAQUAY is a 89 y.o. male with past medical history as below, significant for DM, HLD, HTN, osteoarthritis, COPD who presents to the ED with complaint of chest pain.. The complaint involves an extensive differential diagnosis and also carries with it a high risk of complications and morbidity.  Serious etiology was considered. Ddx includes but is not limited to: Differential includes all life-threatening causes for chest pain. This includes but is not exclusive to acute coronary syndrome, aortic dissection, pulmonary embolism, cardiac tamponade, community-acquired pneumonia, pericarditis, musculoskeletal chest wall pain, etc.   Complete initial physical exam performed, notably the patient  was no acute distress, resting comfortably, asymptomatic.    Reviewed and confirmed nursing documentation for past medical history, family history, social history.  Vital signs reviewed.    Clinical Course as of 11/28/22 2229  Mon Nov 28, 2022  2052 Remains asymptomatic  [SG]  2052 Potassium(!): 3.2 Replace orally [SG]  2053 Alkaline Phosphatase(!): 228 Chronically elevated, no abd pain [SG]  2053 Glucose(!): 424 [SG]  2053 Anion gap: 9 Elev glucose, no AG, no DKA [SG]  2053 Calcium(!): 7.1 Replaced orally, chronically low [SG]  2054 Creatinine(!): 1.30 Approx baseline  [SG]    Clinical Course User Index [SG] Nicholas Sparrow, DO   Patient is currently pain-free.  Labs reviewed and are stable, these are similar to his baseline.  EKG reviewed from today is stable, EKG from 3/28 was also reviewed and appears stable. LAFB noted, ?RBBB. This is similar to prior ECG in Feb.  No STEMI.  Given the pain was 3 days ago would expect some elevation to his troponin if there was indeed an ischemic event however troponin today was negative x 2.  Vital signs are stable and he has remained pain-free.  He has appointment on Wednesday with  cardiology, I feel is reasonable at this time for him to be discharge and follow-up with heart doctor on Wednesday for further evaluation, Dr. Irish Lack.  Discussed this with patient and family at bedside who are in agreement with care plan.  The patient's chest pain is not suggestive of pulmonary embolus, cardiac ischemia, aortic dissection, pericarditis, myocarditis, pulmonary embolism, pneumothorax, pneumonia, Zoster, or esophageal perforation, or other serious etiology.  Historically not abrupt in onset, tearing or ripping, pulses symmetric. EKG nonspecific for ischemia/infarction. No dysrhythmias, brugada, WPW noted.   His HEART Score is a 4, recommend he f/u with cardiology on Wed  The patient improved significantly and was discharged in stable condition. Detailed discussions were had with the patient regarding current findings, and need for close f/u with PCP or on call doctor. The patient has been instructed to return immediately if the symptoms worsen in any way for re-evaluation. Patient verbalized understanding and is in agreement with current care plan. All questions answered prior to discharge.       Additional history obtained: -Additional history obtained from family -External records from outside source obtained and reviewed including: Chart review including previous notes, labs, imaging, consultation notes including primary care documentation, prior labs and imaging, home medications   Lab Tests: -I ordered, reviewed, and interpreted labs.   The pertinent results include:   Labs Reviewed  CBC WITH DIFFERENTIAL/PLATELET - Abnormal; Notable for the following components:      Result Value   RBC 3.66 (*)    Hemoglobin 10.6 (*)  HCT 32.3 (*)    All other components within normal limits  COMPREHENSIVE METABOLIC PANEL - Abnormal; Notable for the following components:   Sodium 133 (*)    Potassium 3.2 (*)    Glucose, Bld 424 (*)    Creatinine, Ser 1.30 (*)    Calcium 7.1  (*)    Total Protein 4.7 (*)    Albumin 2.3 (*)    Alkaline Phosphatase 228 (*)    GFR, Estimated 52 (*)    All other components within normal limits  BRAIN NATRIURETIC PEPTIDE  TROPONIN I (HIGH SENSITIVITY)  TROPONIN I (HIGH SENSITIVITY)    Notable for as above  EKG   EKG Interpretation  Date/Time:  Monday November 28 2022 18:35:06 EDT Ventricular Rate:  87 PR Interval:  182 QRS Duration: 134 QT Interval:  402 QTC Calculation: 483 R Axis:   -47 Text Interpretation: Normal sinus rhythm Right bundle branch block Left anterior fascicular block  Bifascicular block  Abnormal ECG When compared with ECG of 05-Oct-2022 20:16, PREVIOUS ECG IS PRESENT Confirmed by Wynona Dove ([id number]) on 11/28/2022 6:45:21 PM         Imaging Studies ordered: I ordered imaging studies including cxr I independently visualized the following imaging with scope of interpretation limited to determining acute life threatening conditions related to emergency care; findings noted above, significant for stable I independently visualized and interpreted imaging. I agree with the radiologist interpretation   Medicines ordered and prescription drug management: Meds ordered this encounter  Medications   potassium chloride SA (KLOR-CON M) CR tablet 40 mEq   sodium chloride 0.9 % bolus 500 mL   calcium carbonate (TUMS - dosed in mg elemental calcium) chewable tablet 400 mg of elemental calcium    -I have reviewed the patients home medicines and have made adjustments as needed   Consultations Obtained: na   Cardiac Monitoring: The patient was maintained on a cardiac monitor.  I personally viewed and interpreted the cardiac monitored which showed an underlying rhythm of: NSR  Social Determinants of Health:  Diagnosis or treatment significantly limited by social determinants of health: na   Reevaluation: After the interventions noted above, I reevaluated the patient and found that they have resolved  Co  morbidities that complicate the patient evaluation  Past Medical History:  Diagnosis Date   Allergy    ANEMIA ASSOCIATED W/OTHER [facility] NUTRITIONAL DEFIC 08/11/2010   Qualifier: Diagnosis of  By: Ronnald Ramp MD, [name].    Cancer    skin   Cataract    Chronic combined systolic and diastolic congestive heart failure 10/30/2018   COPD with asthma 03/02/2017   CRI (chronic renal insufficiency), stage 3 (moderate) 10/25/2018   Deficiency anemia 10/25/2018   Diabetes mellitus    type 2   DOE (dyspnea on exertion) 09/03/2018   Edema 06/11/2009   Qualifier: Diagnosis of  By: Ronnald Ramp MD, [name].    History of skin cancer    Hyperlipidemia with target LDL less than 100 11/10/2008        Hypertension    Memory loss 05/14/2009   Obesity (BMI 30.0-34.9) 04/01/2013   Osteoarthritis    PSA elevation 10/25/2018   SKIN CANCER, HX OF 11/10/2008   Qualifier: Diagnosis of  By: Ronnald Ramp MD, [name].    Snoring 04/03/2014   Type II diabetes mellitus with manifestations 11/10/2008   Estimated Creatinine Clearance: 38 mL/min (A) (by C-G formula based on SCr of 1.52 mg/dL (H)).    Venous stasis dermatitis of  both lower extremities 01/29/2018   Vitamin D deficiency 11/10/2008      Dispostion: Disposition decision including need for hospitalization was considered, and patient discharged from emergency department.    Final Clinical Impression(s) / ED Diagnoses Final diagnoses:  Chest pain, unspecified type  Hypokalemia  Hypocalcemia  Elevated glucose     This chart was dictated using voice recognition software.  Despite best efforts to proofread,  errors can occur which can change the documentation meaning.    Nicholas Sparrow, DO 11/28/22 2229

## 2022-11-28 NOTE — Discharge Instructions (Signed)
Please see your cardiologist on Wednesday, return if symptoms worsen or recur  It was a pleasure caring for you today in the emergency department.  Please return to the emergency department for any worsening or worrisome symptoms.

## 2022-11-28 NOTE — Telephone Encounter (Signed)
Verbal orders given to [facility] via VM

## 2022-11-29 LAB — CBG MONITORING, ED: Glucose-Capillary: 448 mg/dL — ABNORMAL HIGH (ref 70–99)

## 2022-11-30 ENCOUNTER — Other Ambulatory Visit: Payer: Medicare Other

## 2022-11-30 ENCOUNTER — Ambulatory Visit (HOSPITAL_COMMUNITY): Payer: Medicare Other | Attending: Interventional Cardiology

## 2022-11-30 VITALS — BP 98/48 | HR 85 | Temp 97.8°F

## 2022-11-30 DIAGNOSIS — R9431 Abnormal electrocardiogram [ECG] [EKG]: Secondary | ICD-10-CM | POA: Insufficient documentation

## 2022-11-30 DIAGNOSIS — Z515 Encounter for palliative care: Secondary | ICD-10-CM

## 2022-11-30 DIAGNOSIS — I5042 Chronic combined systolic (congestive) and diastolic (congestive) heart failure: Secondary | ICD-10-CM | POA: Diagnosis not present

## 2022-11-30 LAB — ECHOCARDIOGRAM COMPLETE
Area-P 1/2: 2.13 cm2
S' Lateral: 3.1 cm

## 2022-11-30 NOTE — Progress Notes (Signed)
PATIENT NAME: Nicholas ElliotMark S Burton DOB: Sep 27, 1927 MRN: [id number]  PRIMARY CARE PROVIDER: Etta GrandchildJones, Thomas L, MD  RESPONSIBLE PARTY:  Acct ID - Guarantor Home Phone Work Phone Relationship Acct Type  [id number] Nicholas Burton- Burton,Nicholas S [phone]  Self P/F     [address], [place], [place] [phone]   PATIENT NAME: Nicholas ElliotMark S Burton DOB: Sep 27, 1927 MRN: [id number]   PRIMARY CARE PROVIDER: Etta GrandchildJones, Thomas L, MD   RESPONSIBLE PARTY:  Acct ID - Guarantor Home Phone Work Phone Relationship Acct Type  [id number] Nicholas Burton- Burton,Nicholas S [phone]   Self P/F     [address], [place] [phone]      Palliative Care Follow Up  Encounter Note    Completed home visit. wife Nicholas ChapelJo also present.      HISTORY OF PRESENT ILLNESS:  COPD; sleep apnea; diabetic; wet macular degeneration; 60% hearing loss; spinal stenosis; stage IV metastatic prostate CA    Today's Visit:  Cognitive: Dementia; alert; appropriate conversation   ADLs:  has a caregiver named Nicholas Burton from [employer] who assists with dressing; independent grooming and bathing; needs assist w/getting in and out of the house   Appetite: eats 2 meals and 2 snacks per day; usually breakfast & dinner   Diabetes: still uses the [place] 2 to keep up with BS levels; takes Lantus 12u in the AM and 12u in the PM;    Cardiac: still SOB at times; BLE fluid in ankles - R ankle pitting;   GI/GU: currently has a foley cath; has a BM every day   Mobility: walks w/walker; still able to get out of recliner chair without assist; can get in and out of bed; denies falls   Sleeping Pattern: awake at night d/t frequent urination from diuretic and sometimes low BS; he still eats a snack at night before bed   Respiratory: continues to have SOB on exertion   Pain: 0/10 pain at this time   Goals of Care: Stay in the home with care until death   CODE STATUS: DNR ADVANCED DIRECTIVES: N MOST FORM: N PPS: 50%       PHYSICAL EXAM:   VITALS: Today's  Vitals   11/30/22 1338  BP: (!) 98/48  Pulse: 85  Temp: 97.8 F (36.6 C)  TempSrc: Temporal  SpO2: 94%  PainSc: 0-No pain    LUNGS: clear to auscultation , decreased breath sounds CARDIAC: Cor RRR EXTREMITIES: edema to R ankle - 1+ pitting SKIN: cool, dry, intact NEURO: negative except for coordination problems, memory problems, and weakness   Next scheduled Appt is: Dec 29, 2022 @ 11:30am    Nicholas Burton Clementeen Grahamurry, LPN

## 2022-12-02 ENCOUNTER — Telehealth: Payer: Self-pay

## 2022-12-02 NOTE — Telephone Encounter (Signed)
     Patient  visit on 11/28/2022  at [facility] was for chest pain.  Have you been able to follow up with your primary care physician? Yes. Patient has followed up with Cardiologist also.  The patient was or was not able to obtain any needed medicine or equipment. No medication prescribed.  Are there diet recommendations that you are having difficulty following? No  Patient expresses understanding of discharge instructions and education provided has no other needs at this time. Yes   [place] [facility]  [facility] Population Health Community Resource Care Guide   [email or web address]  ?? [phone]   Website: [email or web address]  [email or web address]

## 2022-12-05 ENCOUNTER — Ambulatory Visit
Admission: RE | Admit: 2022-12-05 | Discharge: 2022-12-05 | Disposition: A | Discharge status: Home / Self Care | Payer: Medicare Other | Source: Ambulatory Visit | Attending: Hematology | Admitting: Hematology

## 2022-12-05 DIAGNOSIS — I5042 Chronic combined systolic (congestive) and diastolic (congestive) heart failure: Secondary | ICD-10-CM | POA: Diagnosis not present

## 2022-12-05 DIAGNOSIS — N183 Chronic kidney disease, stage 3 unspecified: Secondary | ICD-10-CM | POA: Diagnosis not present

## 2022-12-05 DIAGNOSIS — I130 Hypertensive heart and chronic kidney disease with heart failure and stage 1 through stage 4 chronic kidney disease, or unspecified chronic kidney disease: Secondary | ICD-10-CM | POA: Diagnosis not present

## 2022-12-05 DIAGNOSIS — C7951 Secondary malignant neoplasm of bone: Secondary | ICD-10-CM | POA: Diagnosis not present

## 2022-12-05 DIAGNOSIS — E1122 Type 2 diabetes mellitus with diabetic chronic kidney disease: Secondary | ICD-10-CM | POA: Diagnosis not present

## 2022-12-05 DIAGNOSIS — C61 Malignant neoplasm of prostate: Secondary | ICD-10-CM | POA: Diagnosis not present

## 2022-12-05 NOTE — Progress Notes (Signed)
  Radiation Oncology         [phone] ________________________________  Name: Nicholas Burton MRN: [id number]  Date of Service: 12/05/2022  DOB: [birth year]  Post Treatment Telephone Note  Diagnosis:   Metastatic Prostate Cancer to Spine   Intent: Palliative  Radiation Treatment Dates:    10/17/2022 through 10/28/2022 Site Technique Total Dose (Gy) Dose per Fx (Gy) Completed Fx Beam Energies  Lumbar Spine: Spine 3D 30/30 3 10/10 10X, 15X   (as documented in provider EOT note)   The patient was not available for call today. Voicemail left.   The patient is scheduled for ongoing care with Dr. Candise Che in medical oncology. The patient was encouraged to call if he develops concerns or questions regarding radiation.  This concludes the interview.   Ruel Favors, LPN

## 2022-12-08 ENCOUNTER — Other Ambulatory Visit: Payer: Self-pay

## 2022-12-09 ENCOUNTER — Other Ambulatory Visit: Payer: Self-pay

## 2022-12-09 ENCOUNTER — Inpatient Hospital Stay: Payer: Medicare Other | Attending: Hematology

## 2022-12-09 VITALS — BP 105/72 | HR 72 | Temp 98.1°F | Resp 16

## 2022-12-09 DIAGNOSIS — C61 Malignant neoplasm of prostate: Secondary | ICD-10-CM | POA: Insufficient documentation

## 2022-12-09 DIAGNOSIS — E1122 Type 2 diabetes mellitus with diabetic chronic kidney disease: Secondary | ICD-10-CM | POA: Diagnosis not present

## 2022-12-09 DIAGNOSIS — C7951 Secondary malignant neoplasm of bone: Secondary | ICD-10-CM | POA: Insufficient documentation

## 2022-12-09 DIAGNOSIS — Z5111 Encounter for antineoplastic chemotherapy: Secondary | ICD-10-CM | POA: Diagnosis not present

## 2022-12-09 DIAGNOSIS — I130 Hypertensive heart and chronic kidney disease with heart failure and stage 1 through stage 4 chronic kidney disease, or unspecified chronic kidney disease: Secondary | ICD-10-CM | POA: Diagnosis not present

## 2022-12-09 DIAGNOSIS — I5042 Chronic combined systolic (congestive) and diastolic (congestive) heart failure: Secondary | ICD-10-CM | POA: Diagnosis not present

## 2022-12-09 DIAGNOSIS — N183 Chronic kidney disease, stage 3 unspecified: Secondary | ICD-10-CM | POA: Diagnosis not present

## 2022-12-09 MED ORDER — LEUPROLIDE ACETATE (3 MONTH) 22.5 MG ~~LOC~~ KIT
22.5000 mg | PACK | Freq: Once | SUBCUTANEOUS | Status: AC
  Administered 2022-12-09: 22.5 mg via SUBCUTANEOUS
  Filled 2022-12-09: qty 22.5

## 2022-12-09 MED ORDER — LEUPROLIDE ACETATE (3 MONTH) 22.5 MG IM KIT
22.5000 mg | PACK | Freq: Once | INTRAMUSCULAR | Status: DC
  Filled 2022-12-09: qty 22.5

## 2022-12-09 NOTE — Patient Instructions (Signed)
Leuprolide Suspension for Injection (Prostate Cancer) What is this medication? LEUPROLIDE (loo PROE lide) reduces the symptoms of prostate cancer. It works by decreasing levels of the hormone testosterone in the body. This prevents prostate cancer cells from spreading or growing. This medicine may be used for other purposes; ask your health care provider or pharmacist if you have questions. COMMON BRAND NAME(S): Eligard, Lupron Depot, Lupron Depot-Ped, Lutrate Depot, Viadur What should I tell my care team before I take this medication? They need to know if you have any of these conditions: Diabetes Heart disease Heart failure High or low levels of electrolytes, such as magnesium, potassium, or sodium in your blood Irregular heartbeat or rhythm Seizures An unusual or allergic reaction to leuprolide, other medications, foods, dyes, or preservatives Pregnant or trying to get pregnant Breast-feeding How should I use this medication? This medication is injected under the skin or into a muscle. It is given by your care team in a hospital or clinic setting. Talk to your care team about the use of this medication in children. Special care may be needed. Overdosage: If you think you have taken too much of this medicine contact a poison control center or emergency room at once. NOTE: This medicine is only for you. Do not share this medicine with others. What if I miss a dose? Keep appointments for follow-up doses. It is important not to miss your dose. Call your care team if you are unable to keep an appointment. What may interact with this medication? Do not take this medication with any of the following: Cisapride Dronedarone Ketoconazole Levoketoconazole Pimozide Thioridazine This medication may also interact with the following: Other medications that cause heart rhythm changes This list may not describe all possible interactions. Give your health care provider a list of all the medicines,  herbs, non-prescription drugs, or dietary supplements you use. Also tell them if you smoke, drink alcohol, or use illegal drugs. Some items may interact with your medicine. What should I watch for while using this medication? Visit your care team for regular checks on your progress. Tell your care team if your symptoms do not start to get better or if they get worse. This medication may increase blood sugar. The risk may be higher in patients who already have diabetes. Ask your care team what you can do to lower the risk of diabetes while taking this medication. This medication may cause infertility. Talk to your care team if you are concerned about your fertility. Heart attacks and strokes have been reported with the use of this medication. Get emergency help if you develop signs or symptoms of a heart attack or stroke. Talk to your care team about the risks and benefits of this medication. What side effects may I notice from receiving this medication? Side effects that you should report to your care team as soon as possible: Allergic reactions--skin rash, itching, hives, swelling of the face, lips, tongue, or throat Heart attack--pain or tightness in the chest, shoulders, arms, or jaw, nausea, shortness of breath, cold or clammy skin, feeling faint or lightheaded Heart rhythm changes--fast or irregular heartbeat, dizziness, feeling faint or lightheaded, chest pain, trouble breathing High blood sugar (hyperglycemia)--increased thirst or amount of urine, unusual weakness or fatigue, blurry vision Mood swings, irritability, hostility Seizures Stroke--sudden numbness or weakness of the face, arm, or leg, trouble speaking, confusion, trouble walking, loss of balance or coordination, dizziness, severe headache, change in vision Thoughts of suicide or self-harm, worsening mood, feelings of depression Side   effects that usually do not require medical attention (report to your care team if they continue or  are bothersome): Bone pain Change in sex drive or performance General discomfort and fatigue Hot flashes Muscle pain Pain, redness, or irritation at injection site Swelling of the ankles, hands, or feet This list may not describe all possible side effects. Call your doctor for medical advice about side effects. You may report side effects to FDA at 1-800-FDA-1088. Where should I keep my medication? This medication is given in a hospital or clinic. It will not be stored at home. NOTE: This sheet is a summary. It may not cover all possible information. If you have questions about this medicine, talk to your doctor, pharmacist, or health care provider.  2023 Elsevier/Gold Standard (2021-10-25 00:00:00)  

## 2022-12-11 IMAGING — DX DG CHEST 2V
2 series · 2 of 2 positions shown · non-contrast
Comparison: X-ray Chest 09/21/2020.

CLINICAL DATA: Patient complains of cough, mild wheezing and
shortness of breath x 7 days. History of chronic obstructive
pulmonary disease, asthma and diabetes.

EXAM:
CHEST - 2 VIEW

[chest pa]
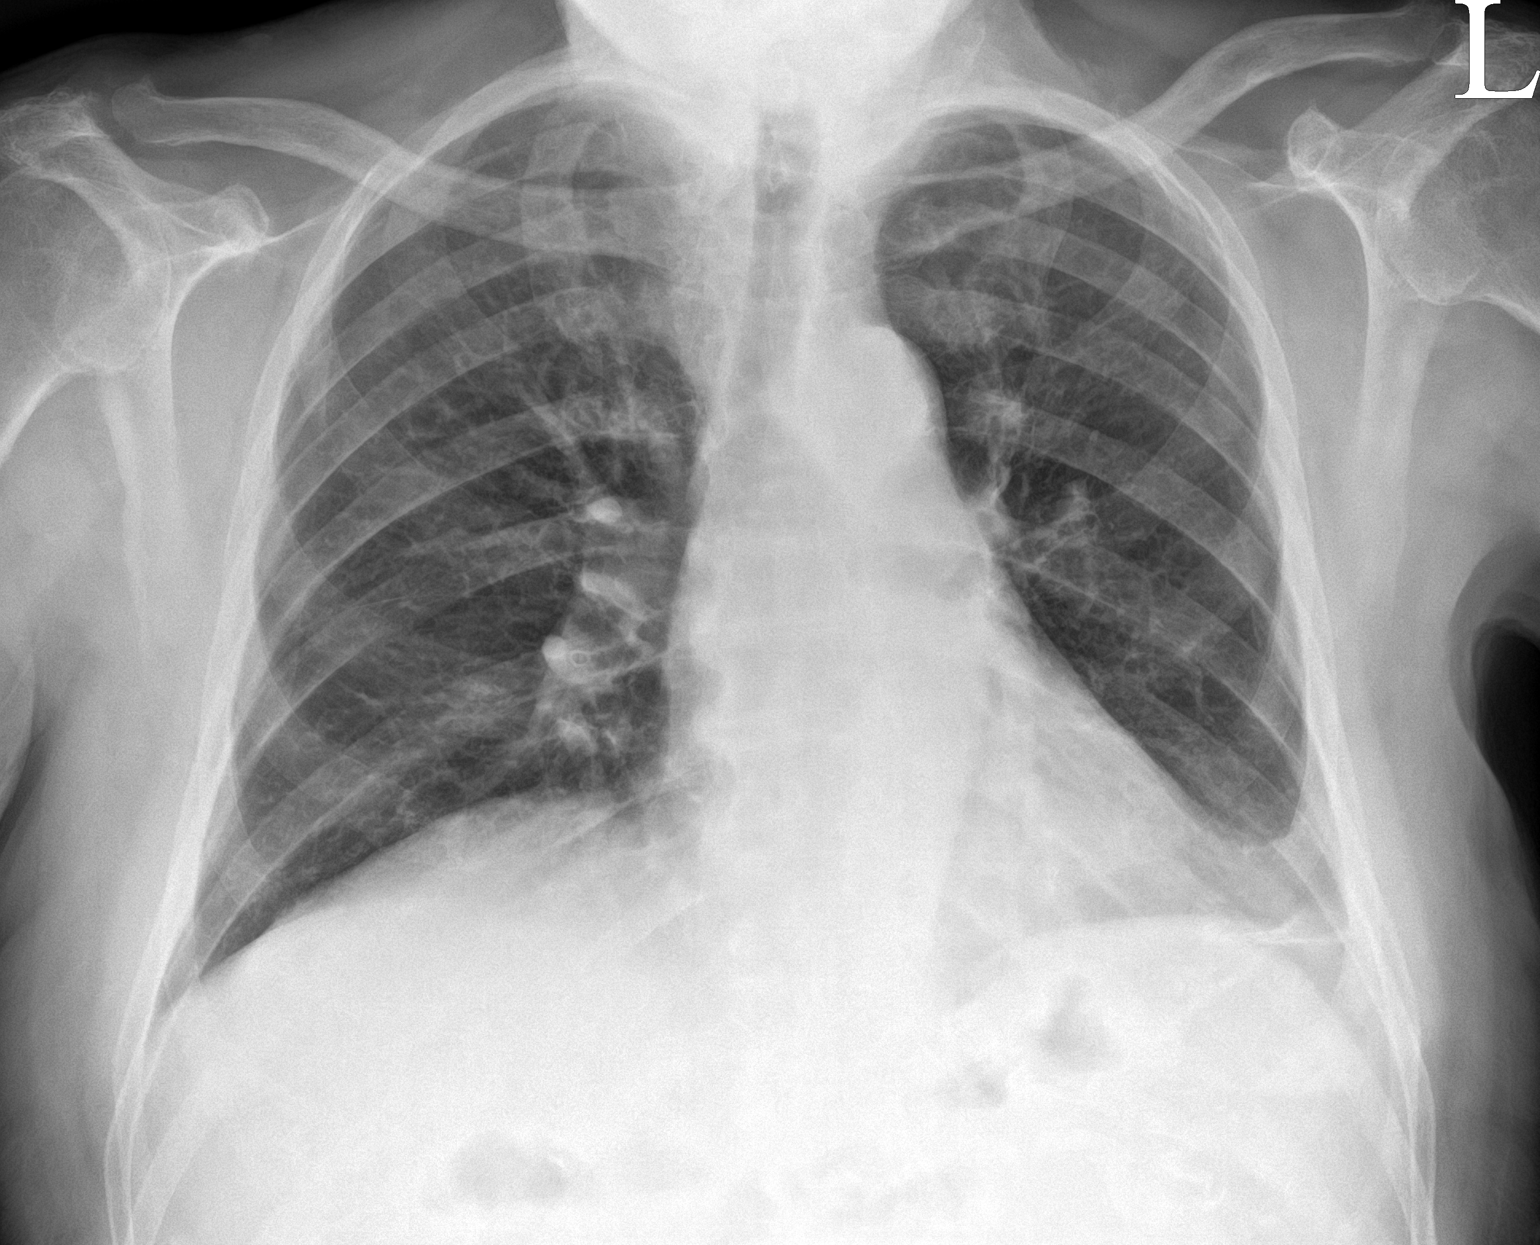

[chest lat]
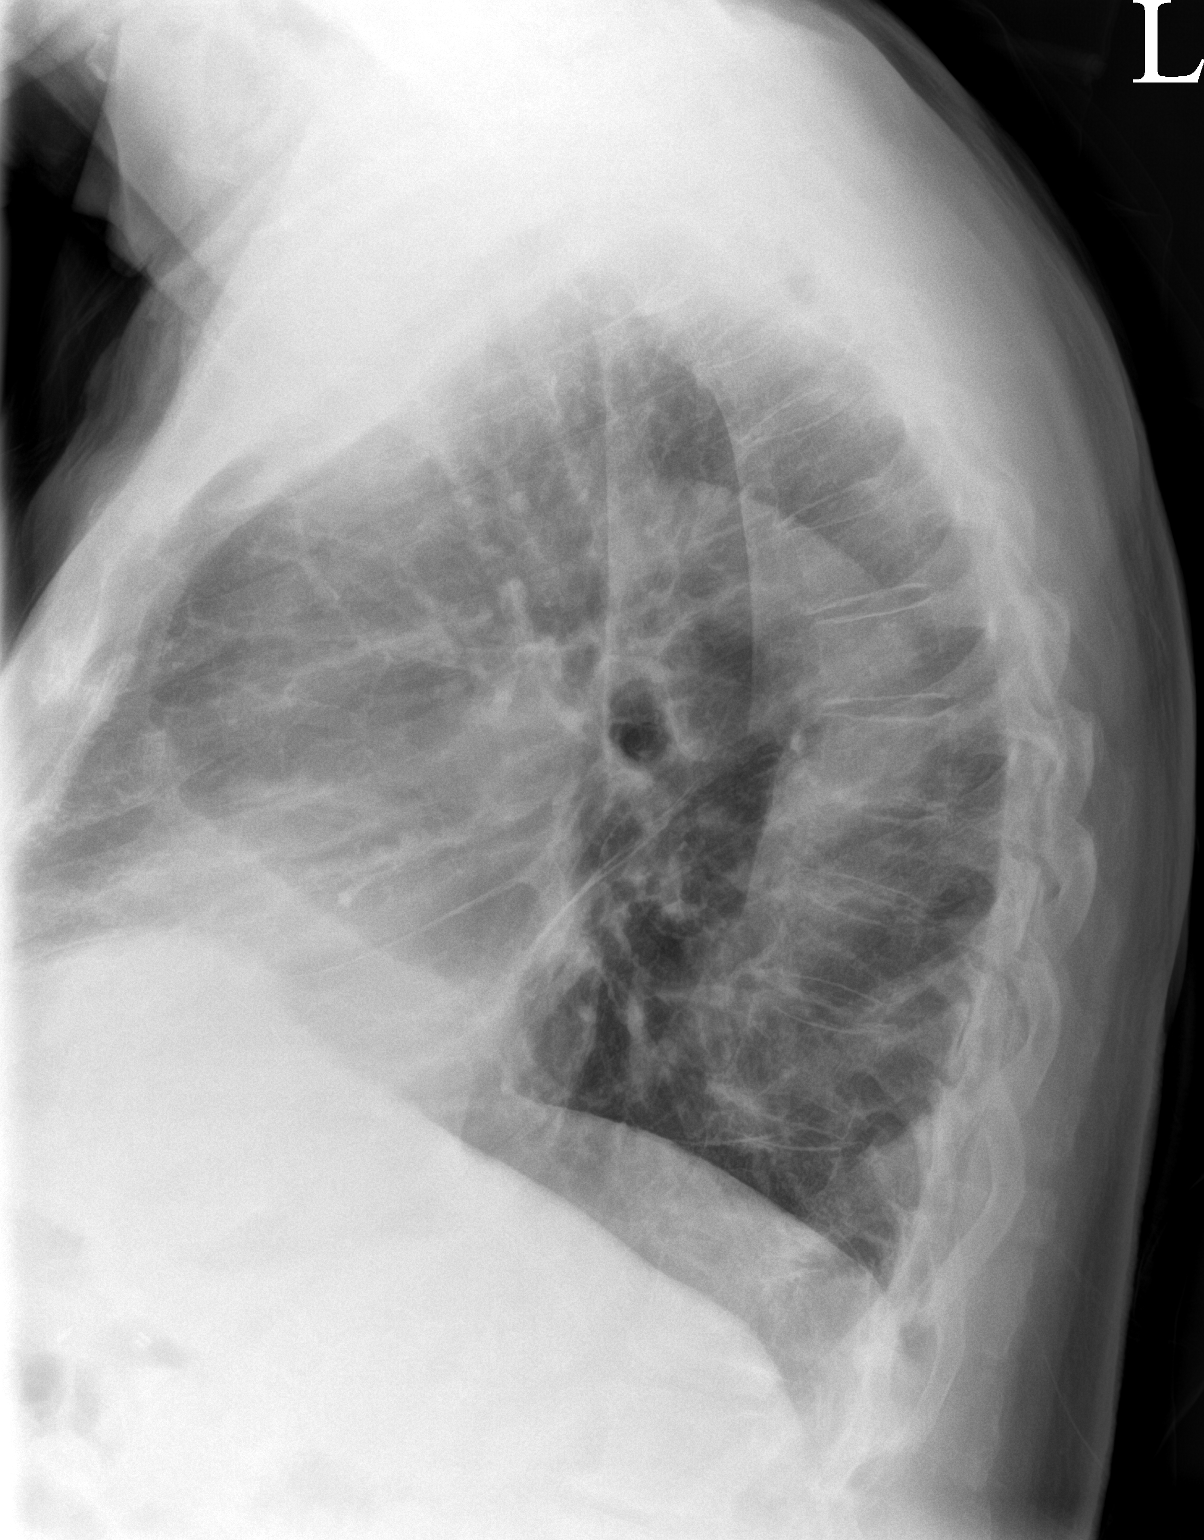

[2 of 2 positions shown; findings below may reference images not displayed]

FINDINGS: The heart size and mediastinal contours are within normal limits.
Chronic linear scarring at the periphery of the left lung base,
unchanged. Lungs are otherwise clear. No pleural effusion or
pneumothorax. The visualized skeletal structures are unremarkable.
IMPRESSION: No active cardiopulmonary disease.

## 2022-12-12 ENCOUNTER — Other Ambulatory Visit (HOSPITAL_COMMUNITY): Payer: Self-pay

## 2022-12-12 ENCOUNTER — Other Ambulatory Visit: Payer: Self-pay | Admitting: Interventional Cardiology

## 2022-12-12 ENCOUNTER — Other Ambulatory Visit: Payer: Self-pay

## 2022-12-12 ENCOUNTER — Other Ambulatory Visit: Payer: Self-pay | Admitting: Internal Medicine

## 2022-12-12 DIAGNOSIS — E785 Hyperlipidemia, unspecified: Secondary | ICD-10-CM

## 2022-12-12 DIAGNOSIS — E118 Type 2 diabetes mellitus with unspecified complications: Secondary | ICD-10-CM

## 2022-12-12 DIAGNOSIS — M545 Low back pain, unspecified: Secondary | ICD-10-CM

## 2022-12-14 ENCOUNTER — Other Ambulatory Visit: Payer: Self-pay

## 2022-12-16 ENCOUNTER — Other Ambulatory Visit: Payer: Medicare Other

## 2022-12-16 ENCOUNTER — Other Ambulatory Visit: Payer: Self-pay

## 2022-12-16 DIAGNOSIS — N183 Chronic kidney disease, stage 3 unspecified: Secondary | ICD-10-CM | POA: Diagnosis not present

## 2022-12-16 DIAGNOSIS — I5042 Chronic combined systolic (congestive) and diastolic (congestive) heart failure: Secondary | ICD-10-CM | POA: Diagnosis not present

## 2022-12-16 DIAGNOSIS — E1122 Type 2 diabetes mellitus with diabetic chronic kidney disease: Secondary | ICD-10-CM | POA: Diagnosis not present

## 2022-12-16 DIAGNOSIS — C61 Malignant neoplasm of prostate: Secondary | ICD-10-CM | POA: Diagnosis not present

## 2022-12-16 DIAGNOSIS — Z515 Encounter for palliative care: Secondary | ICD-10-CM

## 2022-12-16 DIAGNOSIS — C7951 Secondary malignant neoplasm of bone: Secondary | ICD-10-CM | POA: Diagnosis not present

## 2022-12-16 DIAGNOSIS — I130 Hypertensive heart and chronic kidney disease with heart failure and stage 1 through stage 4 chronic kidney disease, or unspecified chronic kidney disease: Secondary | ICD-10-CM | POA: Diagnosis not present

## 2022-12-16 DIAGNOSIS — R338 Other retention of urine: Secondary | ICD-10-CM | POA: Diagnosis not present

## 2022-12-16 NOTE — Progress Notes (Signed)
PATIENT NAME: Nicholas Burton DOB: 12-19-1927 MRN: [id number]  PRIMARY CARE PROVIDER: Etta Grandchild, MD  RESPONSIBLE PARTY:  Acct ID - Guarantor Home Phone Work Phone Relationship Acct Type  [id number] - OMAREE, FUQUA [phone]  Self P/F     [address], [place], [place] [address]   Palliative Care Follow Up  Encounter Note    I connected with wife Odin Mariani regarding DRAYTON TIEU on 12/16/22 by telephone and verified that I am speaking with the correct person using two identifiers.  Marland Kitchen      HISTORY OF PRESENT ILLNESS:  COPD; sleep apnea; diabetic; wet macular degeneration; 60% hearing loss; spinal stenosis; stage IV metastatic prostate CA     Today's Visit:   ADLs:  still has a caregiver from [employer] who assists with dressing; independent grooming and bathing; needs assist w/getting in and out of the house   Appetite: continues to eat 2 meals and 2 snacks per day; usually breakfast & dinner; drinks less than 6-8oz glasses of fluids daily   Diabetes: still uses the [place] 2 to keep up with BS levels; takes Lantus twice daily   Cardiac: still SOB on exertion  Cognitive: wife reports the patient is "not happy today because he failed to pee so he has to keep his catheter"   GI/GU: still has a foley cath because the patient failed the void trial this AM; has a BM every day   Mobility: walks w/walker; still able to get out of recliner chair without assist; can get in and out of bed; denies falls   Sleeping Pattern: awake at night d/t frequent urination from diuretic and sometimes low BS; he still eats a snack at night before bed   Respiratory: continues to have SOB on exertion   Pain: denies pain at this time     Goals of Care: Stay in the home with care until death   CODE STATUS: DNR ADVANCED DIRECTIVES: N MOST FORM: N PPS: 50%     PHYSICAL EXAM:   VITALS:There were no vitals filed for this visit.     Next Scheduled appt: 12/29/22 @ 11:30am    Michele Kerlin  Clementeen Graham, LPN

## 2022-12-22 ENCOUNTER — Other Ambulatory Visit: Payer: Self-pay | Admitting: Internal Medicine

## 2022-12-22 DIAGNOSIS — C61 Malignant neoplasm of prostate: Secondary | ICD-10-CM | POA: Diagnosis not present

## 2022-12-22 DIAGNOSIS — C7951 Secondary malignant neoplasm of bone: Secondary | ICD-10-CM | POA: Diagnosis not present

## 2022-12-22 DIAGNOSIS — E1122 Type 2 diabetes mellitus with diabetic chronic kidney disease: Secondary | ICD-10-CM | POA: Diagnosis not present

## 2022-12-22 DIAGNOSIS — I5042 Chronic combined systolic (congestive) and diastolic (congestive) heart failure: Secondary | ICD-10-CM | POA: Diagnosis not present

## 2022-12-22 DIAGNOSIS — I130 Hypertensive heart and chronic kidney disease with heart failure and stage 1 through stage 4 chronic kidney disease, or unspecified chronic kidney disease: Secondary | ICD-10-CM | POA: Diagnosis not present

## 2022-12-22 DIAGNOSIS — N183 Chronic kidney disease, stage 3 unspecified: Secondary | ICD-10-CM | POA: Diagnosis not present

## 2022-12-23 DIAGNOSIS — I872 Venous insufficiency (chronic) (peripheral): Secondary | ICD-10-CM | POA: Diagnosis not present

## 2022-12-23 DIAGNOSIS — J4489 Other specified chronic obstructive pulmonary disease: Secondary | ICD-10-CM | POA: Diagnosis not present

## 2022-12-23 DIAGNOSIS — G301 Alzheimer's disease with late onset: Secondary | ICD-10-CM | POA: Diagnosis not present

## 2022-12-23 DIAGNOSIS — E876 Hypokalemia: Secondary | ICD-10-CM | POA: Diagnosis not present

## 2022-12-23 DIAGNOSIS — Z7984 Long term (current) use of oral hypoglycemic drugs: Secondary | ICD-10-CM | POA: Diagnosis not present

## 2022-12-23 DIAGNOSIS — Z9181 History of falling: Secondary | ICD-10-CM | POA: Diagnosis not present

## 2022-12-23 DIAGNOSIS — C7951 Secondary malignant neoplasm of bone: Secondary | ICD-10-CM | POA: Diagnosis not present

## 2022-12-23 DIAGNOSIS — F0280 Dementia in other diseases classified elsewhere without behavioral disturbance: Secondary | ICD-10-CM | POA: Diagnosis not present

## 2022-12-23 DIAGNOSIS — M48062 Spinal stenosis, lumbar region with neurogenic claudication: Secondary | ICD-10-CM | POA: Diagnosis not present

## 2022-12-23 DIAGNOSIS — D508 Other iron deficiency anemias: Secondary | ICD-10-CM | POA: Diagnosis not present

## 2022-12-23 DIAGNOSIS — N401 Enlarged prostate with lower urinary tract symptoms: Secondary | ICD-10-CM | POA: Diagnosis not present

## 2022-12-23 DIAGNOSIS — C61 Malignant neoplasm of prostate: Secondary | ICD-10-CM | POA: Diagnosis not present

## 2022-12-23 DIAGNOSIS — Z794 Long term (current) use of insulin: Secondary | ICD-10-CM | POA: Diagnosis not present

## 2022-12-23 DIAGNOSIS — I130 Hypertensive heart and chronic kidney disease with heart failure and stage 1 through stage 4 chronic kidney disease, or unspecified chronic kidney disease: Secondary | ICD-10-CM | POA: Diagnosis not present

## 2022-12-23 DIAGNOSIS — G9341 Metabolic encephalopathy: Secondary | ICD-10-CM | POA: Diagnosis not present

## 2022-12-23 DIAGNOSIS — E538 Deficiency of other specified B group vitamins: Secondary | ICD-10-CM | POA: Diagnosis not present

## 2022-12-23 DIAGNOSIS — E669 Obesity, unspecified: Secondary | ICD-10-CM | POA: Diagnosis not present

## 2022-12-23 DIAGNOSIS — E1151 Type 2 diabetes mellitus with diabetic peripheral angiopathy without gangrene: Secondary | ICD-10-CM | POA: Diagnosis not present

## 2022-12-23 DIAGNOSIS — E1122 Type 2 diabetes mellitus with diabetic chronic kidney disease: Secondary | ICD-10-CM | POA: Diagnosis not present

## 2022-12-23 DIAGNOSIS — N179 Acute kidney failure, unspecified: Secondary | ICD-10-CM | POA: Diagnosis not present

## 2022-12-23 DIAGNOSIS — I5042 Chronic combined systolic (congestive) and diastolic (congestive) heart failure: Secondary | ICD-10-CM | POA: Diagnosis not present

## 2022-12-23 DIAGNOSIS — Z466 Encounter for fitting and adjustment of urinary device: Secondary | ICD-10-CM | POA: Diagnosis not present

## 2022-12-23 DIAGNOSIS — R338 Other retention of urine: Secondary | ICD-10-CM | POA: Diagnosis not present

## 2022-12-23 DIAGNOSIS — N183 Chronic kidney disease, stage 3 unspecified: Secondary | ICD-10-CM | POA: Diagnosis not present

## 2022-12-23 DIAGNOSIS — Z6824 Body mass index (BMI) 24.0-24.9, adult: Secondary | ICD-10-CM | POA: Diagnosis not present

## 2022-12-26 ENCOUNTER — Other Ambulatory Visit (HOSPITAL_COMMUNITY): Payer: Self-pay

## 2022-12-26 ENCOUNTER — Other Ambulatory Visit: Payer: Self-pay

## 2022-12-27 ENCOUNTER — Other Ambulatory Visit: Payer: Self-pay

## 2022-12-27 ENCOUNTER — Other Ambulatory Visit (HOSPITAL_COMMUNITY): Payer: Self-pay

## 2022-12-29 ENCOUNTER — Other Ambulatory Visit: Payer: Medicare Other

## 2022-12-29 VITALS — BP 108/66 | HR 94 | Temp 97.5°F

## 2022-12-29 DIAGNOSIS — Z515 Encounter for palliative care: Secondary | ICD-10-CM

## 2022-12-29 NOTE — Progress Notes (Signed)
PATIENT NAME: Nicholas Burton DOB: 02-19-1928 MRN: [id number]  PRIMARY CARE PROVIDER: Etta Grandchild, MD  RESPONSIBLE PARTY:  Acct ID - Guarantor Home Phone Work Phone Relationship Acct Type  [id number] Nicholas Burton [phone]  Self P/F     [address], [place], [place] [address]   Palliative Care Follow Up  Encounter Note    Completed home visit. Wife Nicholas Burton present     HISTORY OF PRESENT ILLNESS:  COPD; sleep apnea; diabetic; wet macular degeneration; 60% hearing loss; spinal stenosis; stage IV metastatic prostate CA     Today's Visit:   Respiratory: continues to have SOB on exertion  ADLs:  continues with a caregiver from [employer] who assists with dressing; independent grooming and bathing    Appetite: continues to eat 2 meals and 2 snacks per day; usually breakfast & dinner; drinks less than 6-8oz glasses of fluids daily; pt was finishing brunch when LPN arrived   Diabetes: uses the [place] 2 to keep up with BS levels; takes Lantus twice daily   Cardiac: has SOB on exertion; BLE edema   Cognitive: alert and oriented; pt is HOH   GI/GU: still has a foley cath because the patient failed 3 void trial; has a BM every day; drinks adequate amt of fluids   Mobility: walks w/walker; still able to get out of recliner chair without assist; can get in and out of bed; pt had a fall on Apr 26th with no injuries   Sleeping Pattern: awake at night d/t frequent urination from diuretic and sometimes low BS; he still eats a snack at night before bed   Pain: denies pain at this time    Goals of Care: Stay in the home with care as long as he can   CODE STATUS: DNR ADVANCED DIRECTIVES: N MOST FORM: N PPS: 50%   PHYSICAL EXAM:   VITALS: Today's Vitals   12/29/22 1209  BP: 108/66  Pulse: 94  Temp: (!) 97.5 F (36.4 C)  TempSrc: Temporal  SpO2: 97%  PainSc: 0-No pain    LUNGS: clear to auscultation , decreased breath sounds CARDIAC: Cor R}RR EXTREMITIES: BLE  edema; limited ROM SKIN: dry, cool, intact NEURO: positive for coordination problems, gait problems, memory problems, and weakness   Next visit: 01/26/23 @ 3pm   Nicholas Johann Clementeen Graham, LPN

## 2023-01-03 ENCOUNTER — Telehealth: Payer: Self-pay | Admitting: Internal Medicine

## 2023-01-03 ENCOUNTER — Encounter: Payer: Self-pay | Admitting: Internal Medicine

## 2023-01-03 ENCOUNTER — Other Ambulatory Visit: Payer: Self-pay | Admitting: Interventional Cardiology

## 2023-01-03 DIAGNOSIS — I10 Essential (primary) hypertension: Secondary | ICD-10-CM

## 2023-01-03 NOTE — Telephone Encounter (Signed)
Verbal orders given to [facility] as requested.

## 2023-01-03 NOTE — Telephone Encounter (Signed)
Caller & What Company: Vikki Ports with [facility]  Phone Number: [phone]  Needs Verbal orders for what service & frequency: Skilled nursing re certification for 1 visit every other week for 6 wk.

## 2023-01-05 DIAGNOSIS — E1122 Type 2 diabetes mellitus with diabetic chronic kidney disease: Secondary | ICD-10-CM | POA: Diagnosis not present

## 2023-01-05 DIAGNOSIS — N183 Chronic kidney disease, stage 3 unspecified: Secondary | ICD-10-CM | POA: Diagnosis not present

## 2023-01-05 DIAGNOSIS — C7951 Secondary malignant neoplasm of bone: Secondary | ICD-10-CM | POA: Diagnosis not present

## 2023-01-05 DIAGNOSIS — I130 Hypertensive heart and chronic kidney disease with heart failure and stage 1 through stage 4 chronic kidney disease, or unspecified chronic kidney disease: Secondary | ICD-10-CM | POA: Diagnosis not present

## 2023-01-05 DIAGNOSIS — I5042 Chronic combined systolic (congestive) and diastolic (congestive) heart failure: Secondary | ICD-10-CM | POA: Diagnosis not present

## 2023-01-05 DIAGNOSIS — C61 Malignant neoplasm of prostate: Secondary | ICD-10-CM | POA: Diagnosis not present

## 2023-01-10 ENCOUNTER — Other Ambulatory Visit (HOSPITAL_COMMUNITY): Payer: Self-pay

## 2023-01-10 ENCOUNTER — Other Ambulatory Visit: Payer: Self-pay | Admitting: *Deleted

## 2023-01-10 DIAGNOSIS — I10 Essential (primary) hypertension: Secondary | ICD-10-CM

## 2023-01-10 MED ORDER — TORSEMIDE 10 MG PO TABS
10.0000 mg | ORAL_TABLET | ORAL | 2 refills | Status: DC
Start: 2023-01-10 — End: 2023-02-06

## 2023-01-10 NOTE — Telephone Encounter (Signed)
Refill sent to pharmacy.  Patient has appointment in October

## 2023-01-13 DIAGNOSIS — R338 Other retention of urine: Secondary | ICD-10-CM | POA: Diagnosis not present

## 2023-01-17 ENCOUNTER — Encounter: Payer: Self-pay | Admitting: Internal Medicine

## 2023-01-17 ENCOUNTER — Ambulatory Visit (INDEPENDENT_AMBULATORY_CARE_PROVIDER_SITE_OTHER): Payer: Medicare Other | Admitting: Internal Medicine

## 2023-01-17 ENCOUNTER — Other Ambulatory Visit (HOSPITAL_COMMUNITY): Payer: Self-pay

## 2023-01-17 VITALS — BP 122/80 | HR 83 | Temp 98.5°F | Ht 69.0 in | Wt 166.0 lb

## 2023-01-17 DIAGNOSIS — C7951 Secondary malignant neoplasm of bone: Secondary | ICD-10-CM | POA: Diagnosis not present

## 2023-01-17 DIAGNOSIS — E1169 Type 2 diabetes mellitus with other specified complication: Secondary | ICD-10-CM | POA: Diagnosis not present

## 2023-01-17 DIAGNOSIS — Z794 Long term (current) use of insulin: Secondary | ICD-10-CM

## 2023-01-17 DIAGNOSIS — F0280 Dementia in other diseases classified elsewhere without behavioral disturbance: Secondary | ICD-10-CM

## 2023-01-17 DIAGNOSIS — R443 Hallucinations, unspecified: Secondary | ICD-10-CM | POA: Insufficient documentation

## 2023-01-17 DIAGNOSIS — C61 Malignant neoplasm of prostate: Secondary | ICD-10-CM | POA: Diagnosis not present

## 2023-01-17 DIAGNOSIS — G301 Alzheimer's disease with late onset: Secondary | ICD-10-CM | POA: Diagnosis not present

## 2023-01-17 MED ORDER — CEFDINIR 300 MG PO CAPS
300.0000 mg | ORAL_CAPSULE | Freq: Two times a day (BID) | ORAL | 0 refills | Status: DC
Start: 2023-01-17 — End: 2023-02-20

## 2023-01-17 NOTE — Assessment & Plan Note (Signed)
It is possible that this could be impacting hallucinations recent CT head with atrophy noted. He is seeing tractors and people not present and mustache at times on his wife which is not present.

## 2023-01-17 NOTE — Progress Notes (Signed)
   Subjective:   Patient ID: Nicholas Burton, male    DOB: April 11, 1928, 89 y.o.   MRN: [id number]  HPI The patient is a 89 YO man coming in for concerns about infection. Recent possible diskitis not fully ruled out but likely due to metastatic disease. He has been seeing things in the last few days which was the same symptom at time of hospital admission Feb. No change in urine but some sediment. No fevers or chills. No new cough or SOB. No chest pains. No diarrhea/constipation. Chronic hearing changes not different lately. Some throat irritation with swallowing his chemo pill. Some concerns about sugars as well.   Review of Systems  Constitutional:  Positive for fatigue.  HENT:  Positive for hearing loss.   Eyes: Negative.   Respiratory:  Negative for cough, chest tightness and shortness of breath.   Cardiovascular:  Negative for chest pain, palpitations and leg swelling.  Gastrointestinal:  Negative for abdominal distention, abdominal pain, constipation, diarrhea, nausea and vomiting.  Musculoskeletal: Negative.   Skin: Negative.   Neurological: Negative.   Psychiatric/Behavioral:  Positive for hallucinations.     Objective:  Physical Exam Constitutional:      Appearance: He is well-developed.  HENT:     Head: Normocephalic and atraumatic.     Right Ear: Tympanic membrane normal.     Left Ear: Tympanic membrane normal.  Cardiovascular:     Rate and Rhythm: Normal rate and regular rhythm.  Pulmonary:     Effort: Pulmonary effort is normal. No respiratory distress.     Breath sounds: Normal breath sounds. No wheezing or rales.  Abdominal:     General: Bowel sounds are normal. There is no distension.     Palpations: Abdomen is soft.     Tenderness: There is no abdominal tenderness. There is no rebound.  Musculoskeletal:     Cervical back: Normal range of motion.  Skin:    General: Skin is warm and dry.  Neurological:     Mental Status: He is alert and oriented to person, place,  and time.     Coordination: Coordination abnormal.     Vitals:   01/17/23 1313  BP: 122/80  Pulse: 83  Temp: 98.5 F (36.9 C)  TempSrc: Oral  SpO2: 97%  Weight: 166 lb (75.3 kg)  Height: 5\' 9"  (1.753 m)    Assessment & Plan:  Visit time 20 minutes in face to face communication with patient and coordination of care, additional 20 minutes spent in record review, coordination or care, ordering tests, communicating/referring to other healthcare professionals, documenting in medical records all on the same day of the visit for total time 40 minutes spent on the visit.

## 2023-01-17 NOTE — Assessment & Plan Note (Signed)
Patient is wanting to do lantus as single shot instead of divided dosing. Is currently taking 12 units BID lantus. Prior to that 24 units daily (some low and high sugars in hospital). Given that he is no longer taking decadron (family thinks) likely he is able to resume daily dosing and will start with 20 units. He has CGM monitoring and family will notify if he is having low sugars (<90) or high (>250) and we can adjust dosing. Continue jardiance 5 mg daily.

## 2023-01-17 NOTE — Patient Instructions (Addendum)
We will have the lantus 20 units once a day. Let us know if the blood sugars are high (>300) or low (<90)  We will send in antibiotics to take omnicef 1 pill twice a day for 2 weeks in case of infection coming back

## 2023-01-17 NOTE — Assessment & Plan Note (Addendum)
Possible recurrent diskitis (this was unproven but suspected in feb 2024 hospital admission). ID was leaning towards repeat cultures as most evidence points to pathogen directed treatment. Given that most pathogens are strep/staph rx omnicef 300 mg BID to help possible infection (will also cover lungs, sinuses, bladder infection) and if no improvement in 3-4 days needs CT head. There is a concern for brain mets given overall advanced state of cancer. He also has brain atrophy on recent CT scan and could have progression of alzheimer's dementia given severe health condition of metastatic cancer.

## 2023-01-17 NOTE — Assessment & Plan Note (Signed)
With pain and overall controlled currently. He is thinking that this cancer will resolve. Some pain with swallowing xtandi pill advised to put in applesauce and swallow to avoid irritation. He using hydrocodone for pain as needed. Last PET scan 10/2022 with widespread mets to skeletal sytem and in the pelvis and prostate.

## 2023-01-19 ENCOUNTER — Other Ambulatory Visit (HOSPITAL_COMMUNITY): Payer: Self-pay

## 2023-01-19 ENCOUNTER — Other Ambulatory Visit: Payer: Self-pay | Admitting: Hematology

## 2023-01-19 DIAGNOSIS — C7951 Secondary malignant neoplasm of bone: Secondary | ICD-10-CM | POA: Diagnosis not present

## 2023-01-19 DIAGNOSIS — N183 Chronic kidney disease, stage 3 unspecified: Secondary | ICD-10-CM | POA: Diagnosis not present

## 2023-01-19 DIAGNOSIS — I5042 Chronic combined systolic (congestive) and diastolic (congestive) heart failure: Secondary | ICD-10-CM | POA: Diagnosis not present

## 2023-01-19 DIAGNOSIS — I130 Hypertensive heart and chronic kidney disease with heart failure and stage 1 through stage 4 chronic kidney disease, or unspecified chronic kidney disease: Secondary | ICD-10-CM | POA: Diagnosis not present

## 2023-01-19 DIAGNOSIS — E1122 Type 2 diabetes mellitus with diabetic chronic kidney disease: Secondary | ICD-10-CM | POA: Diagnosis not present

## 2023-01-19 DIAGNOSIS — C61 Malignant neoplasm of prostate: Secondary | ICD-10-CM | POA: Diagnosis not present

## 2023-01-19 MED ORDER — ENZALUTAMIDE 80 MG PO TABS
80.0000 mg | ORAL_TABLET | Freq: Every day | ORAL | 1 refills | Status: DC
  Filled 2023-01-19 – 2023-01-20 (×3): qty 30, 30d supply, fill #0

## 2023-01-20 ENCOUNTER — Other Ambulatory Visit (HOSPITAL_COMMUNITY): Payer: Self-pay

## 2023-01-20 ENCOUNTER — Other Ambulatory Visit: Payer: Self-pay

## 2023-01-22 DIAGNOSIS — E1122 Type 2 diabetes mellitus with diabetic chronic kidney disease: Secondary | ICD-10-CM | POA: Diagnosis not present

## 2023-01-22 DIAGNOSIS — M48062 Spinal stenosis, lumbar region with neurogenic claudication: Secondary | ICD-10-CM | POA: Diagnosis not present

## 2023-01-22 DIAGNOSIS — I130 Hypertensive heart and chronic kidney disease with heart failure and stage 1 through stage 4 chronic kidney disease, or unspecified chronic kidney disease: Secondary | ICD-10-CM | POA: Diagnosis not present

## 2023-01-22 DIAGNOSIS — E876 Hypokalemia: Secondary | ICD-10-CM | POA: Diagnosis not present

## 2023-01-22 DIAGNOSIS — Z794 Long term (current) use of insulin: Secondary | ICD-10-CM | POA: Diagnosis not present

## 2023-01-22 DIAGNOSIS — E538 Deficiency of other specified B group vitamins: Secondary | ICD-10-CM | POA: Diagnosis not present

## 2023-01-22 DIAGNOSIS — C7951 Secondary malignant neoplasm of bone: Secondary | ICD-10-CM | POA: Diagnosis not present

## 2023-01-22 DIAGNOSIS — Z6824 Body mass index (BMI) 24.0-24.9, adult: Secondary | ICD-10-CM | POA: Diagnosis not present

## 2023-01-22 DIAGNOSIS — G9341 Metabolic encephalopathy: Secondary | ICD-10-CM | POA: Diagnosis not present

## 2023-01-22 DIAGNOSIS — E669 Obesity, unspecified: Secondary | ICD-10-CM | POA: Diagnosis not present

## 2023-01-22 DIAGNOSIS — N179 Acute kidney failure, unspecified: Secondary | ICD-10-CM | POA: Diagnosis not present

## 2023-01-22 DIAGNOSIS — R338 Other retention of urine: Secondary | ICD-10-CM | POA: Diagnosis not present

## 2023-01-22 DIAGNOSIS — Z466 Encounter for fitting and adjustment of urinary device: Secondary | ICD-10-CM | POA: Diagnosis not present

## 2023-01-22 DIAGNOSIS — I5042 Chronic combined systolic (congestive) and diastolic (congestive) heart failure: Secondary | ICD-10-CM | POA: Diagnosis not present

## 2023-01-22 DIAGNOSIS — N183 Chronic kidney disease, stage 3 unspecified: Secondary | ICD-10-CM | POA: Diagnosis not present

## 2023-01-22 DIAGNOSIS — F0280 Dementia in other diseases classified elsewhere without behavioral disturbance: Secondary | ICD-10-CM | POA: Diagnosis not present

## 2023-01-22 DIAGNOSIS — D508 Other iron deficiency anemias: Secondary | ICD-10-CM | POA: Diagnosis not present

## 2023-01-22 DIAGNOSIS — I872 Venous insufficiency (chronic) (peripheral): Secondary | ICD-10-CM | POA: Diagnosis not present

## 2023-01-22 DIAGNOSIS — Z9181 History of falling: Secondary | ICD-10-CM | POA: Diagnosis not present

## 2023-01-22 DIAGNOSIS — J4489 Other specified chronic obstructive pulmonary disease: Secondary | ICD-10-CM | POA: Diagnosis not present

## 2023-01-22 DIAGNOSIS — N401 Enlarged prostate with lower urinary tract symptoms: Secondary | ICD-10-CM | POA: Diagnosis not present

## 2023-01-22 DIAGNOSIS — C61 Malignant neoplasm of prostate: Secondary | ICD-10-CM | POA: Diagnosis not present

## 2023-01-22 DIAGNOSIS — E1151 Type 2 diabetes mellitus with diabetic peripheral angiopathy without gangrene: Secondary | ICD-10-CM | POA: Diagnosis not present

## 2023-01-22 DIAGNOSIS — G301 Alzheimer's disease with late onset: Secondary | ICD-10-CM | POA: Diagnosis not present

## 2023-01-22 DIAGNOSIS — Z7984 Long term (current) use of oral hypoglycemic drugs: Secondary | ICD-10-CM | POA: Diagnosis not present

## 2023-01-24 ENCOUNTER — Telehealth: Payer: Self-pay | Admitting: Pharmacist

## 2023-01-24 ENCOUNTER — Other Ambulatory Visit: Payer: Self-pay

## 2023-01-24 ENCOUNTER — Other Ambulatory Visit (HOSPITAL_COMMUNITY): Payer: Self-pay

## 2023-01-24 DIAGNOSIS — C61 Malignant neoplasm of prostate: Secondary | ICD-10-CM

## 2023-01-24 MED ORDER — ENZALUTAMIDE 80 MG PO TABS
80.0000 mg | ORAL_TABLET | Freq: Every day | ORAL | 1 refills | Status: DC
Start: 2023-01-24 — End: 2023-03-13

## 2023-01-24 NOTE — Telephone Encounter (Signed)
Oral Oncology Pharmacist Encounter   Patient's insurance requires them to fill through [facility]. Xtandi prescription has been redirected for dispensing.    Patient has been notified of this change.   Lenord Carbo, PharmD, BCPS, BCOP Hematology/Oncology Clinical Pharmacist [facility] Oral Chemotherapy [facility] [phone] 01/24/2023 8:28 AM

## 2023-01-24 NOTE — Telephone Encounter (Signed)
Pt has not been seen since 2022, but has an upcoming appt on 10/202024 with Dr. Eldridge Dace. Would Dr. Eldridge Dace like to refill this medication or does pt need to be seen sooner? Please address

## 2023-01-25 ENCOUNTER — Inpatient Hospital Stay: Payer: Medicare Other | Attending: Hematology | Admitting: Hematology

## 2023-01-25 ENCOUNTER — Other Ambulatory Visit: Payer: Self-pay

## 2023-01-25 ENCOUNTER — Inpatient Hospital Stay: Payer: Medicare Other

## 2023-01-25 VITALS — BP 136/79 | HR 76 | Temp 98.4°F | Resp 18 | Ht 69.0 in | Wt 166.0 lb

## 2023-01-25 DIAGNOSIS — C61 Malignant neoplasm of prostate: Secondary | ICD-10-CM | POA: Insufficient documentation

## 2023-01-25 DIAGNOSIS — R339 Retention of urine, unspecified: Secondary | ICD-10-CM | POA: Diagnosis not present

## 2023-01-25 DIAGNOSIS — E119 Type 2 diabetes mellitus without complications: Secondary | ICD-10-CM | POA: Diagnosis not present

## 2023-01-25 DIAGNOSIS — Z79899 Other long term (current) drug therapy: Secondary | ICD-10-CM | POA: Diagnosis not present

## 2023-01-25 DIAGNOSIS — F0390 Unspecified dementia without behavioral disturbance: Secondary | ICD-10-CM | POA: Diagnosis not present

## 2023-01-25 DIAGNOSIS — C7951 Secondary malignant neoplasm of bone: Secondary | ICD-10-CM | POA: Diagnosis not present

## 2023-01-25 DIAGNOSIS — Z809 Family history of malignant neoplasm, unspecified: Secondary | ICD-10-CM | POA: Diagnosis not present

## 2023-01-25 DIAGNOSIS — I110 Hypertensive heart disease with heart failure: Secondary | ICD-10-CM | POA: Insufficient documentation

## 2023-01-25 DIAGNOSIS — R443 Hallucinations, unspecified: Secondary | ICD-10-CM | POA: Insufficient documentation

## 2023-01-25 DIAGNOSIS — Z7189 Other specified counseling: Secondary | ICD-10-CM

## 2023-01-25 LAB — CMP (CANCER CENTER ONLY)
ALT: 10 U/L (ref 0–44)
AST: 14 U/L — ABNORMAL LOW (ref 15–41)
Albumin: 3.3 g/dL — ABNORMAL LOW (ref 3.5–5.0)
Alkaline Phosphatase: 161 U/L — ABNORMAL HIGH (ref 38–126)
Anion gap: 5 (ref 5–15)
BUN: 19 mg/dL (ref 8–23)
CO2: 30 mmol/L (ref 22–32)
Calcium: 8.5 mg/dL — ABNORMAL LOW (ref 8.9–10.3)
Chloride: 105 mmol/L (ref 98–111)
Creatinine: 1.09 mg/dL (ref 0.61–1.24)
GFR, Estimated: >60 mL/min (ref >60)
Glucose, Bld: 248 mg/dL — ABNORMAL HIGH (ref 70–99)
Potassium: 3.4 mmol/L — ABNORMAL LOW (ref 3.5–5.1)
Sodium: 140 mmol/L (ref 135–145)
Total Bilirubin: 0.5 mg/dL (ref 0.3–1.2)
Total Protein: 5.8 g/dL — ABNORMAL LOW (ref 6.5–8.1)

## 2023-01-25 LAB — CBC WITH DIFFERENTIAL (CANCER CENTER ONLY)
Abs Immature Granulocytes: 0.04 10*3/uL (ref 0.00–0.07)
Basophils Absolute: 0 10*3/uL (ref 0.0–0.1)
Basophils Relative: 0 %
Eosinophils Absolute: 0.1 10*3/uL (ref 0.0–0.5)
Eosinophils Relative: 1 %
HCT: 36 % — ABNORMAL LOW (ref 39.0–52.0)
Hemoglobin: 12 g/dL — ABNORMAL LOW (ref 13.0–17.0)
Immature Granulocytes: 0 %
Lymphocytes Relative: 17 %
Lymphs Abs: 1.6 10*3/uL (ref 0.7–4.0)
MCH: 28.7 pg (ref 26.0–34.0)
MCHC: 33.3 g/dL (ref 30.0–36.0)
MCV: 86.1 fL (ref 80.0–100.0)
Monocytes Absolute: 0.5 10*3/uL (ref 0.1–1.0)
Monocytes Relative: 5 %
Neutro Abs: 7 10*3/uL (ref 1.7–7.7)
Neutrophils Relative %: 77 %
Platelet Count: 197 10*3/uL (ref 150–400)
RBC: 4.18 MIL/uL — ABNORMAL LOW (ref 4.22–5.81)
RDW: 15.7 % — ABNORMAL HIGH (ref 11.5–15.5)
WBC Count: 9.2 10*3/uL (ref 4.0–10.5)
nRBC: 0 % (ref 0.0–0.2)

## 2023-01-25 MED ORDER — METOPROLOL TARTRATE 25 MG PO TABS: ORAL_TABLET | ORAL | 1 refills | Status: DC

## 2023-01-25 NOTE — Telephone Encounter (Signed)
I can't see what medicine we are talking about.

## 2023-01-25 NOTE — Telephone Encounter (Signed)
OK to refill

## 2023-01-25 NOTE — Progress Notes (Signed)
HEMATOLOGY/ONCOLOGY CLINIC NOTE  Date of Service: 01/25/2023  Patient Care Team: Etta Grandchild, MD as PCP - General Eldridge Dace Donnie Coffin, MD as PCP - Cardiology (Cardiology) Szabat, Vinnie Level, [facility] (Inactive) as Pharmacist (Pharmacist) Stephannie Li, MD as Consulting Physician (Ophthalmology)  CHIEF COMPLAINTS/PURPOSE OF CONSULTATION:  L3 spinal metastasis from poorly differentiated prostate cancer   HISTORY OF PRESENTING ILLNESS:  Nicholas Burton is a wonderful 89 y.o. male who has been referred to [place] by Dr Syliva Overman MD for evaluation and management of metastatic poorly differentiated prostate cancer with oligometastatic disease to the L3.   He has a history of hypertension, chronic diastolic heart failure, dyslipidemia, diabetes type 2, dementia and was admitted to [facility] with acute encephalopathy 10/05/2022.   Patient had a CT of the head which showed no acute intracranial findings CT of the lumbar spine was done which showed a lytic destructive pattern involving L2-L3 and L4 vertebral bodies concerning for possible discitis versus osteomyelitis versus tumor.   MRI lumbar spine on 10/05/2022 showed Findings concerning for osteomyelitis discitis at the L3-4 level. Abnormal enhancing material within the ventral epidural space at the level of L3, concerning for epidural phlegmon. These changes superimposed on underlying spondylosis and facet arthrosis resultant severe spinal stenosis. While these changes are favored to reflect infection, a possible neoplastic process involving the L3 vertebral body remains difficult to exclude. Visser interval follow-up imaging may be helpful for further evaluation as warranted.  This was treated with empiric antibiotics and is being followed by infectious disease. Patient subsequently underwent a biopsy of L3 10/07/2022 which showed metastatic poorly differentiated prostate cancer.   Patient subsequently had a CT of the chest abdomen  pelvis without contrast at our recommendation which was done on 10/11/2022 and showed no evidence of metastatic disease in the chest abdomen or pelvis.  Diffuse bladder wall thickening with surrounding inflammatory stranding worrisome for cystitis.  Stable abnormal appearance of L3 and nonspecific presacral edema.  INTERVAL HISTORY  Nicholas Burton is a wonderful 89 y.o. male who is here for continued evaluation and management of metastatic poorly differentiated prostate cancer with oligometastatic disease to the L3.   Patient was last seen by me on 11/15/2022 and reported improved back pain and loose partial dentures associated with weight loss.   Today, he is accompanied by his son and presents in a wheelchair. He reports that he feels fairly well overall. He complains of limited walking. He reports improved mild back pain which does not require pain medication. He regularly engages in physical therapy.  Patient has been regularly taking Xtandi 80mg /d  with no toxicity issues. He denies any nausea, vomiting, diarrhea, fevers, chills, blood in urine, changes in bowel habits,  His appetite has been normal and his weight has been stable.   His son reports mild hallucination. Patient usually sees "baseball players" and "bushes" towards the end of the day. Symptoms are not bothersome. Patient does note some mild sleep disturbances attributed to his catheter.  MEDICAL HISTORY:  Past Medical History:  Diagnosis Date   Allergy    ANEMIA ASSOCIATED W/OTHER [facility] NUTRITIONAL DEFIC 08/11/2010   Qualifier: Diagnosis of  By: Yetta Barre MD, Bernadene Bell.    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    History of skin cancer    Hyperlipidemia with target LDL less than 100 11/10/2008        Hypertension    Memory loss 05/14/2009   Obesity (BMI 30.0-34.9) 04/01/2013   Osteoarthritis    PSA elevation 10/25/2018   SKIN CANCER, HX OF 11/10/2008   Qualifier: Diagnosis of  By: Yetta Barre MD, Bernadene Bell.    Snoring 04/03/2014   Type II diabetes mellitus with manifestations (HCC) 11/10/2008   Estimated Creatinine Clearance: 38 mL/min (A) (by C-G formula based on SCr of 1.52 mg/dL (H)).    Venous stasis dermatitis of both lower extremities 01/29/2018   Vitamin D deficiency 11/10/2008    SURGICAL HISTORY: Past Surgical History:  Procedure Laterality Date   CHOLECYSTECTOMY     INGUINAL HERNIA REPAIR     x 3   IR FLUORO GUIDED NEEDLE PLC ASPIRATION/INJECTION LOC  10/07/2022   JOINT REPLACEMENT     KNEE SURGERY     x 2   MELANOMA EXCISION     x 3 -- Left arm   TOTAL KNEE ARTHROPLASTY     x 3    SOCIAL HISTORY: Social History   Socioeconomic History   Marital status: Married    Spouse name: Not on file   Number of children: 4   Years of education: Not on file   Highest education level: Not on file  Occupational History   Occupation: retired    Associate Professor: RETIRED  Tobacco Use   Smoking status: Never   Smokeless tobacco: Never  Vaping Use   Vaping Use: Never used  Substance and Sexual Activity   Alcohol use: No   Drug use: No   Sexual activity: Yes    Birth control/protection: None  Other Topics Concern   Not on file  Social History Narrative   No regular exercise   Social Determinants of Health   Financial Resource Strain: Low Risk  (07/11/2022)   Overall Financial Resource Strain (CARDIA)    Difficulty of Paying Living Expenses: Not hard at all  Food Insecurity: No Food Insecurity (10/27/2022)   Hunger Vital Sign    Worried About Running Out of Food in the Last Year: Never true    Ran Out of Food in the Last Year: Never true  Transportation Needs: No  Transportation Needs (10/27/2022)   PRAPARE - Administrator, Civil Service (Medical): No    Lack of Transportation (Non-Medical): No  Physical Activity: Inactive (07/09/2021)   Exercise Vital Sign    Days of Exercise per Week: 0 days    Minutes of Exercise per Session: 0 min  Stress: No Stress Concern Present (07/11/2022)   [employer] of Occupational Health - Occupational Stress Questionnaire    Feeling of Stress : Not at all  Social Connections: Moderately Isolated (07/11/2022)   Social Connection and Isolation Panel [NHANES]    Frequency of Communication with Friends and Family: Twice a week    Frequency of Social Gatherings with Friends and Family: Twice a week    Attends Religious Services: Never    Database administrator or Organizations: No    Attends Banker Meetings: Never    Marital Status: Married  Catering manager Violence: Not At Risk (10/27/2022)   Humiliation, Afraid, Rape, and Kick questionnaire  Fear of Current or Ex-Partner: No    Emotionally Abused: No    Physically Abused: No    Sexually Abused: No    FAMILY HISTORY: Family History  Problem Relation Age of Onset   Heart disease Mother    Diabetes Mother    Heart disease Father    Cancer Sister    Cancer Brother    Diabetes Son    Diabetes Other     ALLERGIES:  is allergic to ace inhibitors, oxycodone, and penicillins.  MEDICATIONS:  Current Outpatient Medications  Medication Sig Dispense Refill   acetaminophen (TYLENOL) 325 MG tablet Take 1-2 tablets (325-650 mg total) by mouth every 6 (six) hours as needed for mild pain.     atorvastatin (LIPITOR) 10 MG tablet Take 1 tablet (10 mg total) by mouth daily. 15 tablet 0   cefdinir (OMNICEF) 300 MG capsule Take 1 capsule (300 mg total) by mouth 2 (two) times daily. 14 capsule 0   cholecalciferol (VITAMIN D) 1000 units tablet Take 1 tablet (1,000 Units total) by mouth daily. 30 tablet 0   ciprofloxacin-dexamethasone  (CIPRODEX) OTIC suspension Instill 4 drops to RIGHT ear twice a day for 7 days 7.5 mL 0   Colesevelam HCl 3.75 g PACK Take 1 packet by mouth daily. 9 packet 1   cyanocobalamin (VITAMIN B12) 500 MCG tablet Take 1 tablet (500 mcg total) by mouth daily. 30 tablet 0   dexamethasone (DECADRON) 4 MG tablet TAKE 1 TABLET(4 MG) BY MOUTH DAILY 30 tablet 0   empagliflozin (JARDIANCE) 10 MG TABS tablet Take 1 tablet (10 mg total) by mouth daily. Take 1/2 tablet by mouth daily 30 tablet 0   enzalutamide (XTANDI) 80 MG tablet Take 1 tablet (80 mg total) by mouth daily. 30 tablet 1   HYDROcodone-acetaminophen (NORCO/VICODIN) 5-325 MG tablet Take 1-2 tablets by mouth every 4 (four) hours as needed for moderate pain. 30 tablet 0   insulin glargine (LANTUS SOLOSTAR) 100 UNIT/ML Solostar Pen INJECT 30 UNITS UNDER THE SKIN AT BEDTIME 15 mL 1   lidocaine (LIDODERM) 5 % Place 1 patch onto the skin daily. Remove & Discard patch within 12 hours or as directed by MD 30 patch 0   magnesium gluconate (MAGONATE) 500 MG tablet Take 1 tablet (500 mg total) by mouth at bedtime. 30 tablet 0   melatonin 3 MG TABS tablet Take 1 tablet (3 mg total) by mouth at bedtime as needed. 30 tablet 0   metoprolol tartrate (LOPRESSOR) 25 MG tablet TAKE ONE-HALF (1/2) TABLET TWICE A DAY 90 tablet 1   Multiple Vitamins-Minerals (PRESERVISION/LUTEIN) CAPS Take 1 capsule by mouth 2 (two) times daily.     tamsulosin (FLOMAX) 0.4 MG CAPS capsule Take 1 capsule (0.4 mg total) by mouth daily. 90 capsule 0   torsemide (DEMADEX) 10 MG tablet Take 1 tablet (10 mg total) by mouth every other day. Please keep appointment in October for additional refills 45 tablet 2   Vibegron (GEMTESA) 75 MG TABS TAKE 1 TABLET DAILY (Patient taking differently: Take 75 mg by mouth daily.) 90 tablet 1   Vitamin D, Ergocalciferol, (DRISDOL) 1.25 MG (50000 UNIT) CAPS capsule Take 1 capsule (50,000 Units total) by mouth every 7 (seven) days. 5 capsule 0   No current  facility-administered medications for this visit.    REVIEW OF SYSTEMS:    10 Point review of Systems was done is negative except as noted above.   PHYSICAL EXAMINATION: ECOG PERFORMANCE STATUS: 2 - Symptomatic, <50% confined to  bed  . Vitals:   01/25/23 1318  BP: 136/79  Pulse: 76  Resp: 18  Temp: 98.4 F (36.9 C)  SpO2: 98%     Filed Weights   01/25/23 1318  Weight: 166 lb (75.3 kg)  .Body mass index is 24.51 kg/m. GENERAL:alert, in no acute distress and comfortable SKIN: no acute rashes, no significant lesions EYES: conjunctiva are pink and non-injected, sclera anicteric OROPHARYNX: MMM, no exudates, no oropharyngeal erythema or ulceration NECK: supple, no JVD LYMPH:  no palpable lymphadenopathy in the cervical, axillary or inguinal regions LUNGS: clear to auscultation b/l with normal respiratory effort HEART: regular rate & rhythm ABDOMEN:  normoactive bowel sounds , non tender, not distended. Extremity: no pedal edema PSYCH: alert & oriented x 3 with fluent speech NEURO: no focal motor/sensory deficits   LABORATORY DATA:  I have reviewed the data as listed  .    Latest Ref Rng & Units 01/25/2023    2:15 PM 11/28/2022    6:38 PM  CBC  WBC 4.0 - 10.5 K/uL 9.2  10.3   Hemoglobin 13.0 - 17.0 g/dL 29.5  62.1   Hematocrit 39.0 - 52.0 % 36.0  32.3   Platelets 150 - 400 K/uL 197  177     .    Latest Ref Rng & Units 01/25/2023    2:15 PM 11/28/2022    6:38 PM  CMP  Glucose 70 - 99 mg/dL 308  657   BUN 8 - 23 mg/dL 19  17   Creatinine 8.46 - 1.24 mg/dL 9.62  9.52   Sodium 841 - 145 mmol/L 140  133   Potassium 3.5 - 5.1 mmol/L 3.4  3.2   Chloride 98 - 111 mmol/L 105  98   CO2 22 - 32 mmol/L 30  26   Calcium 8.9 - 10.3 mg/dL 8.5  7.1   Total Protein 6.5 - 8.1 g/dL 5.8  4.7   Total Bilirubin 0.3 - 1.2 mg/dL 0.5  0.5   Alkaline Phos 38 - 126 U/L 161  228   AST 15 - 41 U/L 14  21   ALT 0 - 44 U/L 10  28       RADIOGRAPHIC STUDIES: I have personally  reviewed the radiological images as listed and agreed with the findings in the report. No results found.  ASSESSMENT & PLAN:   89 year old male with no previous history of malignancy.  Previous history of hypertension, dyslipidemia, diabetes, CHF dementia though alert oriented x 3 at baseline admitted with altered mental status which is now more or less cleared significant new back pain.   #1 Pathologic L3 compression due to bone metastases from metastatic poorly differentiated prostate cancer.  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 Cannot rule out additional infection.  No cultures were sent from his L3 biopsy. PSA levels are elevated at 55.1 CT chest abdomen pelvis did not show any other sites of overt metastatic disease at this time or overt prostate lesions.   #2 history of dementia though apparently alert oriented x 3 at baseline with poor cognition.   #3 hypertension, dyslipidemia, diabetes, CHF.  PLAN:  -will order labs today including PSA, CBC, and CMP after today's visit -patient regularly follows with a urologist monthly. Patient continues to have urinary retention. -recommend patient to have a daily schedule/routine to have a better sense of the day in order to improve cognition and biological clock -if hallucinations continue to be bothersome, discussed medication  options low dose Seroquel -continue to regularly work with physical therapy weekly and to stay physically active -answered all of patient's and his son's questions in detail -Patient treatment plan is: Xtandi with Leuprolide shots every 3 months. Xgeva or Zometa shot after dental clearance.  -discussed goal to control prostate cancer for as long as possible with Lucron injection and Xtandi -patient's last Lupron injection was 4/12 and shall receive the next dose in 3 months -continue low-dose 80mg  Enzalutamide  FOLLOW-UP: Labs today Plz schedule next 4  doses of Lupron every 3 months per orders RTC with Dr Candise Che with labs with next dose of lupron  The total time spent in the appointment was 32 minutes* .  All of the patient's questions were answered with apparent satisfaction. The patient knows to call the clinic with any problems, questions or concerns.   Wyvonnia Lora MD MS AAHIVMS [facility] Hematology/Oncology Physician [facility]  .*Total Encounter Time as defined by the Centers for Medicare and Medicaid Services includes, in addition to the face-to-face time of a patient visit (documented in the note above) non-face-to-face time: obtaining and reviewing outside history, ordering and reviewing medications, tests or procedures, care coordination (communications with other health care professionals or caregivers) and documentation in the medical record.    I,Mitra Faeizi,acting as a Neurosurgeon for Wyvonnia Lora, MD.,have documented all relevant documentation on the behalf of Wyvonnia Lora, MD,as directed by  Wyvonnia Lora, MD while in the presence of Wyvonnia Lora, MD.  .I have reviewed the above documentation for accuracy and completeness, and I agree with the above. Marland KitchenJohney Maine MD   ADDENDUM  PSA down to 3.8 showing good ressonse to treatment K 3.4 -- will need to continue to mx potassium replacement with his diuretics with PCP

## 2023-01-25 NOTE — Telephone Encounter (Signed)
Medication is Metoprolol. I do apologize. Would Dr. Eldridge Dace like to refill until appt time? Please address

## 2023-01-26 ENCOUNTER — Other Ambulatory Visit: Payer: Self-pay

## 2023-01-26 ENCOUNTER — Other Ambulatory Visit: Payer: Medicare Other

## 2023-01-26 ENCOUNTER — Emergency Department (HOSPITAL_COMMUNITY)
Admission: EM | Admit: 2023-01-26 | Discharge: 2023-01-26 | Disposition: A | Discharge status: Home / Self Care | Payer: Medicare Other | Attending: Emergency Medicine | Admitting: Emergency Medicine

## 2023-01-26 ENCOUNTER — Emergency Department (HOSPITAL_COMMUNITY): Payer: Medicare Other

## 2023-01-26 VITALS — BP 120/70 | HR 81 | Temp 96.8°F

## 2023-01-26 DIAGNOSIS — I10 Essential (primary) hypertension: Secondary | ICD-10-CM | POA: Insufficient documentation

## 2023-01-26 DIAGNOSIS — T83091A Other mechanical complication of indwelling urethral catheter, initial encounter: Secondary | ICD-10-CM | POA: Diagnosis not present

## 2023-01-26 DIAGNOSIS — Z515 Encounter for palliative care: Secondary | ICD-10-CM

## 2023-01-26 DIAGNOSIS — T839XXA Unspecified complication of genitourinary prosthetic device, implant and graft, initial encounter: Secondary | ICD-10-CM

## 2023-01-26 DIAGNOSIS — R4182 Altered mental status, unspecified: Secondary | ICD-10-CM | POA: Diagnosis not present

## 2023-01-26 DIAGNOSIS — Z794 Long term (current) use of insulin: Secondary | ICD-10-CM | POA: Diagnosis not present

## 2023-01-26 DIAGNOSIS — E876 Hypokalemia: Secondary | ICD-10-CM | POA: Insufficient documentation

## 2023-01-26 DIAGNOSIS — E119 Type 2 diabetes mellitus without complications: Secondary | ICD-10-CM | POA: Diagnosis not present

## 2023-01-26 DIAGNOSIS — Z7984 Long term (current) use of oral hypoglycemic drugs: Secondary | ICD-10-CM | POA: Diagnosis not present

## 2023-01-26 DIAGNOSIS — R609 Edema, unspecified: Secondary | ICD-10-CM | POA: Diagnosis not present

## 2023-01-26 DIAGNOSIS — R442 Other hallucinations: Secondary | ICD-10-CM | POA: Diagnosis not present

## 2023-01-26 DIAGNOSIS — Y732 Prosthetic and other implants, materials and accessory gastroenterology and urology devices associated with adverse incidents: Secondary | ICD-10-CM | POA: Insufficient documentation

## 2023-01-26 DIAGNOSIS — Z79899 Other long term (current) drug therapy: Secondary | ICD-10-CM | POA: Diagnosis not present

## 2023-01-26 DIAGNOSIS — Z8546 Personal history of malignant neoplasm of prostate: Secondary | ICD-10-CM | POA: Insufficient documentation

## 2023-01-26 LAB — CBC WITH DIFFERENTIAL/PLATELET
Abs Immature Granulocytes: 0.03 10*3/uL (ref 0.00–0.07)
Basophils Absolute: 0 10*3/uL (ref 0.0–0.1)
Basophils Relative: 0 %
Eosinophils Absolute: 0.1 10*3/uL (ref 0.0–0.5)
Eosinophils Relative: 1 %
HCT: 40.2 % (ref 39.0–52.0)
Hemoglobin: 13.1 g/dL (ref 13.0–17.0)
Immature Granulocytes: 0 %
Lymphocytes Relative: 40 %
Lymphs Abs: 4.1 10*3/uL — ABNORMAL HIGH (ref 0.7–4.0)
MCH: 28.4 pg (ref 26.0–34.0)
MCHC: 32.6 g/dL (ref 30.0–36.0)
MCV: 87.2 fL (ref 80.0–100.0)
Monocytes Absolute: 0.8 10*3/uL (ref 0.1–1.0)
Monocytes Relative: 8 %
Neutro Abs: 5.1 10*3/uL (ref 1.7–7.7)
Neutrophils Relative %: 51 %
Platelets: 213 10*3/uL (ref 150–400)
RBC: 4.61 MIL/uL (ref 4.22–5.81)
RDW: 15.7 % — ABNORMAL HIGH (ref 11.5–15.5)
WBC: 10.2 10*3/uL (ref 4.0–10.5)
nRBC: 0 % (ref 0.0–0.2)

## 2023-01-26 LAB — URINALYSIS, ROUTINE W REFLEX MICROSCOPIC
Bilirubin Urine: NEGATIVE
Glucose, UA: >=500 mg/dL — AB
Ketones, ur: NEGATIVE mg/dL
Nitrite: NEGATIVE
Protein, ur: NEGATIVE mg/dL
RBC / HPF: >50 RBC/hpf (ref 0–5)
Specific Gravity, Urine: 1.011 (ref 1.005–1.030)
WBC, UA: >50 WBC/hpf (ref 0–5)
pH: 6 (ref 5.0–8.0)

## 2023-01-26 LAB — COMPREHENSIVE METABOLIC PANEL
ALT: 15 U/L (ref 0–44)
AST: 19 U/L (ref 15–41)
Albumin: 3.2 g/dL — ABNORMAL LOW (ref 3.5–5.0)
Alkaline Phosphatase: 163 U/L — ABNORMAL HIGH (ref 38–126)
Anion gap: 10 (ref 5–15)
BUN: 20 mg/dL (ref 8–23)
CO2: 25 mmol/L (ref 22–32)
Calcium: 8.7 mg/dL — ABNORMAL LOW (ref 8.9–10.3)
Chloride: 103 mmol/L (ref 98–111)
Creatinine, Ser: 1.15 mg/dL (ref 0.61–1.24)
GFR, Estimated: 59 mL/min — ABNORMAL LOW (ref >60)
Glucose, Bld: 245 mg/dL — ABNORMAL HIGH (ref 70–99)
Potassium: 2.9 mmol/L — ABNORMAL LOW (ref 3.5–5.1)
Sodium: 138 mmol/L (ref 135–145)
Total Bilirubin: 0.5 mg/dL (ref 0.3–1.2)
Total Protein: 6.3 g/dL — ABNORMAL LOW (ref 6.5–8.1)

## 2023-01-26 LAB — PROSTATE-SPECIFIC AG, SERUM (LABCORP): Prostate Specific Ag, Serum: 3.8 ng/mL (ref 0.0–4.0)

## 2023-01-26 MED ORDER — POTASSIUM CHLORIDE CRYS ER 20 MEQ PO TBCR: 20.0000 meq | EXTENDED_RELEASE_TABLET | Freq: Two times a day (BID) | ORAL | 0 refills | Status: DC

## 2023-01-26 MED ORDER — ONDANSETRON HCL 4 MG/2ML IJ SOLN: 4.0000 mg | Freq: Once | INTRAMUSCULAR | Status: DC

## 2023-01-26 MED ORDER — HYDROMORPHONE HCL 1 MG/ML IJ SOLN: 0.5000 mg | Freq: Once | INTRAMUSCULAR | Status: DC

## 2023-01-26 MED ORDER — POTASSIUM CHLORIDE CRYS ER 20 MEQ PO TBCR
40.0000 meq | EXTENDED_RELEASE_TABLET | Freq: Once | ORAL | Status: AC
  Administered 2023-01-26: 40 meq via ORAL
  Filled 2023-01-26: qty 2

## 2023-01-26 NOTE — ED Triage Notes (Addendum)
Patient coming from home via EMS with c/o catheter issues starting today at noon and visual hallucinations x2 weeks. Per EMS catheter has not drained since noon today and patient is complaining of swelling and pain in the penis. Pt also c/o lower abdominal pressure. Catheter last place 5/17

## 2023-01-26 NOTE — Discharge Instructions (Addendum)
Take the potassium starting tomorrow as directed for 2 days.  We sent your urine for urine culture if it is consistent with a urinary tract infection you will be notified and they will start you on antibiotics.  Return for any new or worse symptoms.  Return if the Foley catheter becomes obstructed again.

## 2023-01-26 NOTE — ED Provider Notes (Signed)
[name]   CSN: [id number] Arrival date & time: 01/26/23  1851     History  Chief Complaint  Patient presents with   Foley Catheter Issues   Hallucinations    Nicholas Burton is a 89 y.o. male.  Patient brought in by EMS.  Patient brought in for Foley catheter not draining.  It has not drained very well since noon and has had some urine escaping around the catheter.  Family also reports some intermittent visual hallucinations for 2 weeks.  Patient was recently seen by his oncology doctor he has a history of metastatic prostate cancer.  And they were aware of this.  Here this evening patient is uncomfortable due to abdominal pain but very alert and is even able to tell me appropriately the name of his pharmacy.  Denies any nausea or vomiting.  There has not been any fevers.  Patient has the catheter in place long-term secondary to prostate cancer and patient had it recently changed out.  It was last changed out on May 17.  In addition patient just completed a course of Omnicef for presumptive urinary tract infection he has been off that now for a couple of days.  Past medical history sniffing for diabetes hypertension the metastatic prostate cancer some memory problems noted back in 2010.       Home Medications Prior to Admission medications   Medication Sig Start Date End Date Taking? Authorizing Provider  potassium chloride SA (KLOR-CON M) 20 MEQ tablet Take 1 tablet (20 mEq total) by mouth 2 (two) times daily. 01/26/23  Yes Vanetta Mulders, MD  acetaminophen (TYLENOL) 325 MG tablet Take 1-2 tablets (325-650 mg total) by mouth every 6 (six) hours as needed for mild pain. 10/21/22   Angiulli, Mcarthur Rossetti, PA-C  atorvastatin (LIPITOR) 10 MG tablet Take 1 tablet (10 mg total) by mouth daily. 12/14/22   Corky Crafts, MD  cefdinir (OMNICEF) 300 MG capsule Take 1 capsule (300 mg total) by mouth 2 (two) times daily. 01/17/23    Myrlene Broker, MD  cholecalciferol (VITAMIN D) 1000 units tablet Take 1 tablet (1,000 Units total) by mouth daily. 10/21/22   Angiulli, Mcarthur Rossetti, PA-C  ciprofloxacin-dexamethasone (CIPRODEX) OTIC suspension Instill 4 drops to RIGHT ear twice a day for 7 days 10/27/22   Elenore Paddy, NP  Colesevelam HCl 3.75 g PACK Take 1 packet by mouth daily. 11/21/22   Etta Grandchild, MD  cyanocobalamin (VITAMIN B12) 500 MCG tablet Take 1 tablet (500 mcg total) by mouth daily. 10/21/22   Angiulli, Mcarthur Rossetti, PA-C  dexamethasone (DECADRON) 4 MG tablet TAKE 1 TABLET(4 MG) BY MOUTH DAILY 12/12/22   Etta Grandchild, MD  empagliflozin (JARDIANCE) 10 MG TABS tablet Take 1 tablet (10 mg total) by mouth daily. Take 1/2 tablet by mouth daily 10/27/22   Elenore Paddy, NP  enzalutamide Diana Eves) 80 MG tablet Take 1 tablet (80 mg total) by mouth daily. 01/24/23   Johney Maine, MD  HYDROcodone-acetaminophen (NORCO/VICODIN) 5-325 MG tablet Take 1-2 tablets by mouth every 4 (four) hours as needed for moderate pain. 10/21/22   Angiulli, Mcarthur Rossetti, PA-C  insulin glargine (LANTUS SOLOSTAR) 100 UNIT/ML Solostar Pen INJECT 30 UNITS UNDER THE SKIN AT BEDTIME 12/22/22   Etta Grandchild, MD  lidocaine (LIDODERM) 5 % Place 1 patch onto the skin daily. Remove & Discard patch within 12 hours or as directed by MD 10/21/22   Angiulli,  Mcarthur Rossetti, PA-C  magnesium gluconate (MAGONATE) 500 MG tablet Take 1 tablet (500 mg total) by mouth at bedtime. 10/21/22   Angiulli, Mcarthur Rossetti, PA-C  melatonin 3 MG TABS tablet Take 1 tablet (3 mg total) by mouth at bedtime as needed. 10/21/22   Angiulli, Mcarthur Rossetti, PA-C  metoprolol tartrate (LOPRESSOR) 25 MG tablet TAKE ONE-HALF (1/2) TABLET TWICE A DAY 01/25/23   Corky Crafts, MD  Multiple Vitamins-Minerals (PRESERVISION/LUTEIN) CAPS Take 1 capsule by mouth 2 (two) times daily.    [provider]  tamsulosin (FLOMAX) 0.4 MG CAPS capsule Take 1 capsule (0.4 mg total) by mouth daily. 11/18/22    Etta Grandchild, MD  torsemide (DEMADEX) 10 MG tablet Take 1 tablet (10 mg total) by mouth every other day. Please keep appointment in October for additional refills 01/10/23   Corky Crafts, MD  Vibegron (GEMTESA) 75 MG TABS TAKE 1 TABLET DAILY Patient taking differently: Take 75 mg by mouth daily. 07/25/22   Etta Grandchild, MD  Vitamin D, Ergocalciferol, (DRISDOL) 1.25 MG (50000 UNIT) CAPS capsule Take 1 capsule (50,000 Units total) by mouth every 7 (seven) days. 10/22/22   Angiulli, Mcarthur Rossetti, PA-C      Allergies    Ace inhibitors, Oxycodone, and Penicillins    Review of Systems   Review of Systems  Constitutional:  Negative for chills and fever.  HENT:  Negative for ear pain and sore throat.   Eyes:  Negative for pain and visual disturbance.  Respiratory:  Negative for cough and shortness of breath.   Cardiovascular:  Negative for chest pain and palpitations.  Gastrointestinal:  Positive for abdominal pain. Negative for vomiting.  Genitourinary:  Positive for difficulty urinating. Negative for dysuria and hematuria.  Musculoskeletal:  Negative for arthralgias and back pain.  Skin:  Negative for color change and rash.  Neurological:  Negative for seizures and syncope.  Psychiatric/Behavioral:  Positive for confusion.   All other systems reviewed and are negative.   Physical Exam Updated Vital Signs BP 131/82 (BP Location: Right Arm)   Pulse 96   Temp 98.4 F (36.9 C) (Oral)   Resp 15   SpO2 100%  Physical Exam Vitals and nursing note reviewed.  Constitutional:      General: He is not in acute distress.    Appearance: Normal appearance. He is well-developed. He is not ill-appearing.  HENT:     Head: Normocephalic and atraumatic.     Mouth/Throat:     Mouth: Mucous membranes are moist.  Eyes:     Extraocular Movements: Extraocular movements intact.     Conjunctiva/sclera: Conjunctivae normal.     Pupils: Pupils are equal, round, and reactive to light.   Cardiovascular:     Rate and Rhythm: Normal rate and regular rhythm.     Heart sounds: No murmur heard. Pulmonary:     Effort: Pulmonary effort is normal. No respiratory distress.     Breath sounds: Normal breath sounds.  Abdominal:     Palpations: Abdomen is soft.     Tenderness: There is abdominal tenderness.     Comments: Some tenderness to palpation suprapubic area probable bladder.  Feels as if the bladder is enlarged.  Genitourinary:    Penis: Normal.      Comments: Foley catheter in place.  Balloon was inflated it also slides back in without any difficulty.  Only a small amount of urine in the urine bag.  But it is clear. Musculoskeletal:  General: No swelling.     Cervical back: Normal range of motion and neck supple. No rigidity.  Skin:    General: Skin is warm and dry.     Capillary Refill: Capillary refill takes less than 2 seconds.  Neurological:     General: No focal deficit present.     Mental Status: He is alert. Mental status is at baseline.     Cranial Nerves: No cranial nerve deficit.     Sensory: No sensory deficit.     Motor: No weakness.  Psychiatric:        Mood and Affect: Mood normal.     ED Results / Procedures / Treatments   Labs (all labs ordered are listed, but only abnormal results are displayed) Labs Reviewed  URINALYSIS, ROUTINE W REFLEX MICROSCOPIC - Abnormal; Notable for the following components:      Result Value   APPearance CLOUDY (*)    Glucose, UA >=500 (*)    Hgb urine dipstick SMALL (*)    Leukocytes,Ua LARGE (*)    Bacteria, UA RARE (*)    All other components within normal limits  CBC WITH DIFFERENTIAL/PLATELET - Abnormal; Notable for the following components:   RDW 15.7 (*)    Lymphs Abs 4.1 (*)    All other components within normal limits  COMPREHENSIVE METABOLIC PANEL - Abnormal; Notable for the following components:   Potassium 2.9 (*)    Glucose, Bld 245 (*)    Calcium 8.7 (*)    Total Protein 6.3 (*)    Albumin  3.2 (*)    Alkaline Phosphatase 163 (*)    GFR, Estimated 59 (*)    All other components within normal limits  URINE CULTURE    EKG None  Radiology DG Chest Port 1 View  Result Date: 01/26/2023 CLINICAL DATA:  Altered mental status EXAM: PORTABLE CHEST 1 VIEW COMPARISON:  11/28/2022 FINDINGS: Small left pleural effusion or thickening as before. No consolidation. Stable cardiomediastinal silhouette. No pneumothorax IMPRESSION: No active disease. Small left pleural effusion or thickening. Electronically Signed   By: Jasmine Pang M.D.   On: 01/26/2023 20:51    Procedures Procedures    Medications Ordered in ED Medications  ondansetron [facility]) injection 4 mg (0 mg Intravenous Hold 01/26/23 2221)  HYDROmorphone (DILAUDID) injection 0.5 mg (0 mg Intravenous Hold 01/26/23 2221)  potassium chloride SA (KLOR-CON M) CR tablet 40 mEq (40 mEq Oral Given 01/26/23 2205)    ED Course/ Medical Decision Making/ A&P                             Medical Decision Making Amount and/or Complexity of Data Reviewed Labs: ordered. Radiology: ordered.  Risk Prescription drug management.   Foley catheter not draining.  We irrigated the Foley catheter and got a large amount of urine out over a liter.  Patient feels much better.  Urinalysis urine is a little cloudy RBCs greater than 50 white blood cells greater than 50 however no real bacteria.  Will send urine for culture just completed a course of antibiotics would not restart any new will base it off the urine plus has had chronic Foley catheter.  Reassuring is that that is CBC white count 10.2 hemoglobin 13.1 platelets 213 complete metabolic panel significant for potassium of 2.9 GFR 59 alk phos 163 sodium and chloride are normal blood sugar up a little bit at 245 CO2 is 25.  Patient known to have some  renal insufficiency.  Chest x-ray without anything acute small left pleural effusion.  After getting the Foley catheter to work patient is very alert  cognitively seems to be very sound.  Feeling much better.  Patient got some oral potassium here will discharge home with oral potassium and urine culture is pending.  Do not see any evidence of any significant altered mental status at this time.  And no leukocytosis no fever nothing that suggest a serious infection at this time. Final Clinical Impression(s) / ED Diagnoses Final diagnoses:  Foley catheter problem, initial encounter (HCC)  Hypokalemia    Rx / DC Orders ED Discharge Orders          Ordered    potassium chloride SA (KLOR-CON M) 20 MEQ tablet  2 times daily        01/26/23 2248              Vanetta Mulders, MD 01/26/23 2256

## 2023-01-26 NOTE — Progress Notes (Signed)
PATIENT NAME: Nicholas Burton DOB: Feb 25, 1928 MRN: [id number]  PRIMARY CARE PROVIDER: Etta Grandchild, MD  RESPONSIBLE PARTY:  Acct ID - Guarantor Home Phone Work Phone Relationship Acct Type  [id number] Nicholas Burton, Nicholas Burton [phone]  Self P/F     [address], [place], [place] [address]   Palliative Care Follow Up  Encounter Note    Completed home visit. Wife Nicholas Burton present     HISTORY OF PRESENT ILLNESS:  COPD; sleep apnea; diabetic; wet macular degeneration; 60% hearing loss; spinal stenosis; stage IV metastatic prostate CA     Today's Visit:   Respiratory: no SOB noted during this visit   ADLs: a caregiver comes to the home from 4pm - 8pm with [employer] who assists with dressing; independent grooming and bathing    Appetite: still eats 2 meals and 2 snacks per day; usually breakfast & dinner; drinks less than 6-8oz glasses of fluids daily   Diabetes: still uses the Snowflake 2 to keep up with BS levels; now takes Lantus daily in the morning   Cardiac: BLE edema; wife reports pt was told he had a MI within the past 30 days    Cognitive: alert and oriented; pt is HOH; pt is seeing things (bushes and baseball players), was put on ABT but it did not help with the symptoms; pt is thought to be sundowning (as per wife per Dr Candise Che)   GI/GU: still has a foley cath; has a BM every day; drinks adequate amt of fluids; pt to have another void trial with Urology in the next 1-2 weeks   Mobility: walks w/walker; still able to get out of recliner chair without assist; can get in and out of bed; encouraged pt to use his wheelchair when he is outside of the home   Sleeping Pattern: awake at night d/t frequent urination from diuretic and sometimes low BS; he still eats a snack at night before bed   Pain: denies pain at this time  Falls: pt fell twice the week before Mother's Day trying to pick up ice off the floor; encouraged pt to stop bending over to pick up ice off the floor       Goals  of Care: Stay in the home with care as long as he can   CODE STATUS: DNR ADVANCED DIRECTIVES: N MOST FORM: N PPS: 50%    PHYSICAL EXAM:   VITALS: Today's Vitals   01/26/23 1050  BP: 120/70  Pulse: 81  Temp: (!) 96.8 F (36 C)  TempSrc: Temporal  SpO2: 98%  PainSc: 0-No pain    LUNGS: clear to auscultation  CARDIAC: Cor RRR EXTREMITIES: AROM x4 SKIN: Skin color, texture, turgor normal. No rashes or lesions  NEURO: negative except for gait problems and weakness       Sion Reinders Clementeen Graham, LPN

## 2023-01-27 LAB — URINE CULTURE

## 2023-01-28 LAB — URINE CULTURE: Culture: >=100000 — AB

## 2023-01-29 LAB — URINE CULTURE

## 2023-01-30 ENCOUNTER — Other Ambulatory Visit: Payer: Self-pay | Admitting: Interventional Cardiology

## 2023-01-30 ENCOUNTER — Other Ambulatory Visit: Payer: Self-pay | Admitting: Internal Medicine

## 2023-01-30 ENCOUNTER — Telehealth (HOSPITAL_BASED_OUTPATIENT_CLINIC_OR_DEPARTMENT_OTHER): Payer: Self-pay | Admitting: *Deleted

## 2023-01-30 DIAGNOSIS — E119 Type 2 diabetes mellitus without complications: Secondary | ICD-10-CM

## 2023-01-30 DIAGNOSIS — E118 Type 2 diabetes mellitus with unspecified complications: Secondary | ICD-10-CM

## 2023-01-30 DIAGNOSIS — E785 Hyperlipidemia, unspecified: Secondary | ICD-10-CM

## 2023-01-30 DIAGNOSIS — M545 Low back pain, unspecified: Secondary | ICD-10-CM

## 2023-01-30 NOTE — Telephone Encounter (Signed)
Pt has an upcoming appt in October 2024 with Dr. Eldridge Dace. Pt has not been seen since 06/2021. Does pt need to be seen sooner or refill medications until appt time in October 2024? Please address

## 2023-01-30 NOTE — Telephone Encounter (Signed)
Post ED Visit - Positive Culture Follow-up  Culture report reviewed by antimicrobial stewardship pharmacist: [facility] []  Enzo Bi, Pharm.D. []  Celedonio Miyamoto, Pharm.D., BCPS AQ-ID []  Garvin Fila, Pharm.D., BCPS []  Georgina Pillion, Pharm.D., BCPS []  [place], [address].D., BCPS, AAHIVP []  Estella Husk, Pharm.D., BCPS, AAHIVP []  Lysle Pearl, PharmD, BCPS []  Phillips Climes, PharmD, BCPS []  Agapito Games, PharmD, BCPS []  Verlan Friends, PharmD []  Mervyn Gay, PharmD, BCPS []  Vinnie Level, PharmD  [facility] []  Len Childs, PharmD []  Greer Pickerel, PharmD []  Adalberto Cole, PharmD []  Perlie Gold, Rph []  Lonell Face) Jean Rosenthal, PharmD []  Earl Many, PharmD []  Junita Push, PharmD []  Dorna Leitz, PharmD []  Terrilee Files, PharmD []  Lynann Beaver, PharmD []  Keturah Barre, PharmD []  Loralee Pacas, PharmD [x]  Clarice Pole, PharmD   Positive urine culture Colonized catheter and no further patient follow-up is required at this time per Vivien Rossetti, MD  Patsey Berthold 01/30/2023, 9:12 AM

## 2023-01-30 NOTE — Progress Notes (Signed)
ED Antimicrobial Stewardship Positive Culture Follow Up   Nicholas Burton is an 89 y.o. male who presented to [facility] on 01/26/2023 with a chief complaint of  Chief Complaint  Patient presents with   Foley Catheter Issues   Hallucinations    Recent Results (from the past 720 hour(s))  Urine Culture     Status: Abnormal   Collection Time: 01/26/23  9:05 PM   Specimen: Urine, Catheterized  Result Value Ref Range Status   Specimen Description   Final    URINE, CATHETERIZED Performed at [facility], [address], [place], [place] [address]    Special Requests   Final    NONE Performed at [facility], [address] [place], [place] [address]    Culture (A)  Final    >=100,000 COLONIES/mL PSEUDOMONAS AERUGINOSA 80,000 COLONIES/mL PSEUDOMONAS AERUGINOSA    Report Status 01/29/2023 FINAL  Final   Organism ID, Bacteria PSEUDOMONAS AERUGINOSA (A)  Final   Organism ID, Bacteria PSEUDOMONAS AERUGINOSA (A)  Final      Susceptibility   Pseudomonas aeruginosa - MIC*    CEFTAZIDIME 4 SENSITIVE Sensitive     CIPROFLOXACIN <=0.25 SENSITIVE Sensitive     GENTAMICIN <=1 SENSITIVE Sensitive     IMIPENEM 1 SENSITIVE Sensitive     PIP/TAZO 8 SENSITIVE Sensitive     CEFEPIME 2 SENSITIVE Sensitive    Pseudomonas aeruginosa - MIC*    CEFTAZIDIME 2 SENSITIVE Sensitive     CIPROFLOXACIN <=0.25 SENSITIVE Sensitive     GENTAMICIN <=1 SENSITIVE Sensitive     IMIPENEM 1 SENSITIVE Sensitive     PIP/TAZO <=4 SENSITIVE Sensitive     CEFEPIME 2 SENSITIVE Sensitive     * >=100,000 COLONIES/mL PSEUDOMONAS AERUGINOSA    80,000 COLONIES/mL PSEUDOMONAS AERUGINOSA    89 year old male with foley catheter not draining. Had foley irrigated in ED and all symptoms resolved. This culture was drawn from his indwelling foley and likely represents colonization. No treatment needed.    ED Provider: Vivien Rossetti, MD    Sharin Mons, PharmD, BCPS, BCIDP Infectious  Diseases Clinical Pharmacist Phone: [phone] 01/30/2023, 8:54 AM

## 2023-01-30 NOTE — Telephone Encounter (Signed)
OK to refill until appointment in October

## 2023-02-01 ENCOUNTER — Encounter: Payer: Self-pay | Admitting: Hematology

## 2023-02-01 ENCOUNTER — Telehealth: Payer: Self-pay | Admitting: Hematology

## 2023-02-01 ENCOUNTER — Encounter: Payer: Self-pay | Admitting: *Deleted

## 2023-02-01 ENCOUNTER — Telehealth: Payer: Self-pay | Admitting: *Deleted

## 2023-02-01 DIAGNOSIS — N183 Chronic kidney disease, stage 3 unspecified: Secondary | ICD-10-CM | POA: Diagnosis not present

## 2023-02-01 DIAGNOSIS — I5042 Chronic combined systolic (congestive) and diastolic (congestive) heart failure: Secondary | ICD-10-CM | POA: Diagnosis not present

## 2023-02-01 DIAGNOSIS — C61 Malignant neoplasm of prostate: Secondary | ICD-10-CM | POA: Diagnosis not present

## 2023-02-01 DIAGNOSIS — C7951 Secondary malignant neoplasm of bone: Secondary | ICD-10-CM | POA: Diagnosis not present

## 2023-02-01 DIAGNOSIS — E1122 Type 2 diabetes mellitus with diabetic chronic kidney disease: Secondary | ICD-10-CM | POA: Diagnosis not present

## 2023-02-01 DIAGNOSIS — I130 Hypertensive heart and chronic kidney disease with heart failure and stage 1 through stage 4 chronic kidney disease, or unspecified chronic kidney disease: Secondary | ICD-10-CM | POA: Diagnosis not present

## 2023-02-01 NOTE — Progress Notes (Unsigned)
  Care Coordination  Outreach Note  02/01/2023 Name: Nicholas Burton MRN: [id number] DOB: 02/28/1928   Care Coordination Outreach Attempts: An unsuccessful telephone outreach was attempted today to offer the patient information about available care coordination services.  Follow Up Plan:  Additional outreach attempts will be made to offer the patient care coordination information and services.   Encounter Outcome:  No Answer  Christie Nottingham  Care Coordination Care Guide  Direct Dial: [phone]

## 2023-02-01 NOTE — Transitions of Care (Post Inpatient/ED Visit) (Signed)
02/01/2023  Name: Nicholas Burton MRN: [id number] DOB: 1927-10-14  Today's TOC FU Call Status: Today's TOC FU Call Status:: Successful TOC FU Call Competed TOC FU Call Complete Date: 02/01/23  ED EMMI Red Alert notification on 01/31/23 from ED visit 01/26/23- EMMI call placed 02/01/23: "No scheduled follow up"  Transition Care Management Follow-up Telephone Call Date of Discharge: 01/26/23 Discharge Facility: [facility] Type of Discharge: Emergency Department Reason for ED Visit: Other: (foley catheter obstruction) How have you been since you were released from the hospital?: Better (per spouse Aurea Graff "Things are overall better since the ER visit.  No more problems with foley catheter and he is scheduled to see the urology people on 02/14/23 for a catheter change.  He now has PCS through the [place] every afternoon M-F") Any questions or concerns?: No  Items Reviewed: Did you receive and understand the discharge instructions provided?: Yes (thoroughly reviewed with patient who verbalizes good understanding of same - reports her sons handle all of patient's medications and appointments; son not currently available) Medications obtained,verified, and reconciled?: Partial Review Completed Reason for Partial Mediation Review: Son handles medications- spouse reports she is not aware of any medication issues and is not sure whether son picked up recently Rx'd K+ after ER visit: reports she will check when she sees son later today and will call back if any issues/ concerns arise upon speaking with son Any new allergies since your discharge?: No Dietary orders reviewed?: Yes Type of Diet Ordered:: "Healthy as possible, but mainly just regularl diet" Do you have support at home?: Yes People in Home: spouse Name of Support/Comfort Primary Source: Reports essentially independent in self-care activities but does require assistance with some self-care; supportive spouse and local extended family assists as/ if  needed/ indicated; reports patient also has PCS nurse through [place], 4 hours QD M-F as well as community palliative care nurse visiting regularly  Medications Reviewed Today: Medications Reviewed Today     Reviewed by Michaela Corner, RN (Registered Nurse) on 02/01/23 at 1115  Med List Status: <None>   Medication Order Taking? Sig Documenting Provider Last Dose Status Informant  acetaminophen (TYLENOL) 325 MG tablet [id number]  Take 1-2 tablets (325-650 mg total) by mouth every 6 (six) hours as needed for mild pain. Charlton Amor, PA-C  Active   atorvastatin (LIPITOR) 10 MG tablet [id number]  Take 1 tablet (10 mg total) by mouth daily. Pt needs to keep upcoming appt in Oct for further refills Corky Crafts, MD  Active   cefdinir (OMNICEF) 300 MG capsule [id number]  Take 1 capsule (300 mg total) by mouth 2 (two) times daily. Myrlene Broker, MD  Active   cholecalciferol (VITAMIN D) 1000 units tablet [id number]  Take 1 tablet (1,000 Units total) by mouth daily. Charlton Amor, PA-C  Active   ciprofloxacin-dexamethasone (CIPRODEX) OTIC suspension [id number]  Instill 4 drops to RIGHT ear twice a day for 7 days Elenore Paddy, NP  Active   Colesevelam HCl 3.75 g PACK [id number]  Take 1 packet by mouth daily. Etta Grandchild, MD  Active   cyanocobalamin (VITAMIN B12) 500 MCG tablet [id number]  Take 1 tablet (500 mcg total) by mouth daily. Charlton Amor, PA-C  Active   dexamethasone (DECADRON) 4 MG tablet [id number]  TAKE 1 TABLET(4 MG) BY MOUTH DAILY Etta Grandchild, MD  Active   empagliflozin (JARDIANCE) 10 MG TABS tablet [id number]  Take 1 tablet (10 mg total) by mouth daily. Take  1/2 tablet by mouth daily Elenore Paddy, NP  Active   enzalutamide Diana Eves) 80 MG tablet [id number]  Take 1 tablet (80 mg total) by mouth daily. Johney Maine, MD  Active   HYDROcodone-acetaminophen (NORCO/VICODIN) 5-325 MG tablet [id number]  Take 1-2 tablets by mouth every 4 (four) hours as needed for  moderate pain. AngiulliMcarthur Rossetti, PA-C  Active   insulin glargine (LANTUS SOLOSTAR) 100 UNIT/ML Solostar Pen [id number]  INJECT 30 UNITS UNDER THE SKIN AT BEDTIME Etta Grandchild, MD  Active   lidocaine (LIDODERM) 5 % [id number]  Place 1 patch onto the skin daily. Remove & Discard patch within 12 hours or as directed by MD Angiulli, Mcarthur Rossetti, PA-C  Active   losartan (COZAAR) 25 MG tablet [id number]  Take 1 tablet (25 mg total) by mouth daily. Pt needs to keep upcoming appt in Oct for further refills Corky Crafts, MD  Active   magnesium gluconate (MAGONATE) 500 MG tablet [id number]  Take 1 tablet (500 mg total) by mouth at bedtime. AngiulliMcarthur Rossetti, PA-C  Active   melatonin 3 MG TABS tablet [id number]  Take 1 tablet (3 mg total) by mouth at bedtime as needed. Charlton Amor, PA-C  Active   metoprolol tartrate (LOPRESSOR) 25 MG tablet [id number]  TAKE ONE-HALF (1/2) TABLET TWICE A DAY Corky Crafts, MD  Active   Multiple Vitamins-Minerals (PRESERVISION/LUTEIN) CAPS [id number]  Take 1 capsule by mouth 2 (two) times daily. [provider]  Active Spouse/Significant Other, Family Member, Pharmacy Records  potassium chloride SA (KLOR-CON M) 20 MEQ tablet [id number] No Take 1 tablet (20 mEq total) by mouth 2 (two) times daily. Vanetta Mulders, MD Unknown Active            Med Note Otelia Limes Feb 01, 2023 10:51 AM) 02/01/23: spouse reports during [facility] call that their son handles all medications- she is "not sure" if son added recently Rx'd K+- states she will ask their son when she talks to him later today  tamsulosin (FLOMAX) 0.4 MG CAPS capsule [id number]  Take 1 capsule (0.4 mg total) by mouth daily. Etta Grandchild, MD  Active   torsemide (DEMADEX) 10 MG tablet [id number]  Take 1 tablet (10 mg total) by mouth every other day. Please keep appointment in October for additional refills Corky Crafts, MD  Active   Vibegron [facility]) 75 MG TABS [id number]  TAKE 1 TABLET DAILY   Patient taking differently: Take 75 mg by mouth daily.   Etta Grandchild, MD  Active Spouse/Significant Other, Family Member, Pharmacy Records  Vitamin D, Ergocalciferol, (DRISDOL) 1.25 MG (50000 UNIT) CAPS capsule [id number]  Take 1 capsule (50,000 Units total) by mouth every 7 (seven) days. Charlton Amor, PA-C  Active   Med List Note Otis Peak, [facility] 01/24/23 [id number]): Diana Eves filled at [facility]           [facility] and Equipment/Supplies: Were Home Health Services Ordered?: No Any new equipment or medical supplies ordered?: No  Functional Questionnaire: Do you need assistance with bathing/showering or dressing?: Yes ("sometimes;" spouse reports has daily care assistance PCS services through [place], M-F afternoons; family also assists if needed) Do you need assistance with meal preparation?: Yes (spouse/ family prepare meals) Do you need assistance with eating?: No Do you have difficulty maintaining continence: No Do you need assistance with getting out of bed/getting out of a chair/moving?: Yes ("sometimes;" spouse reports has daily care assistance PCS  services through [place], M-F afternoons; family also assists if needed) Do you have difficulty managing or taking your medications?: Yes (son manages all aspects of medication administration)  Follow up appointments reviewed: PCP Follow-up appointment confirmed?: Yes Date of PCP follow-up appointment?: 02/20/23 (offered to schedule sooner appointment- spouse declines/ reports "no need;" problem resolved and has urology follow up visit scheduled 02/14/23) Follow-up Provider: PCP Specialist Hospital Follow-up appointment confirmed?: Yes Date of Specialist follow-up appointment?: 02/14/23 (verified this is recommended time frame for follow up per hospital discharging provider notes) Follow-Up Specialty Provider:: urology provider Do you need transportation to your follow-up appointment?: No Do you understand care options  if your condition(s) worsen?: Yes-patient verbalized understanding  SDOH Interventions Today    Flowsheet Row Most Recent Value  SDOH Interventions   Food Insecurity Interventions Intervention Not Indicated  Transportation Interventions Intervention Not Indicated  [family provides transportation]      TOC Interventions Today    Flowsheet Row Most Recent Value  TOC Interventions   TOC Interventions Discussed/Reviewed TOC Interventions Discussed  [Spouse declines need for ongoing/ further care coordination outreach,  no care coordination needs identified at time of TOC call today,  provided my direct contact information should questions/ concerns/ needs arise post-TOC call]      Interventions Today    Flowsheet Row Most Recent Value  Chronic Disease   Chronic disease during today's visit Other, Congestive Heart Failure (CHF), Diabetes  [foley catheter obstruction]  General Interventions   General Interventions Discussed/Reviewed General Interventions Discussed, Doctor Visits, Durable Medical Equipment (DME)  [offered to place RN CM Care Coordinator follow up call- spouse declines- states has PCS and Community Palliative care in place- states "we don't need another nurse"]  Doctor Visits Discussed/Reviewed Specialist, Doctor Visits Discussed, PCP  Durable Medical Equipment (DME) Walker  PCP/Specialist Visits Compliance with follow-up visit  Nutrition Interventions   Nutrition Discussed/Reviewed Nutrition Discussed  Pharmacy Interventions   Pharmacy Dicussed/Reviewed Pharmacy Topics Discussed  [need to discuss with son who manages medications if post-ED Rx was obtained,  need to verify any medication concerns and call me back if concerns are present]  Safety Interventions   Safety Discussed/Reviewed Safety Discussed      Caryl Pina, RN, BSN, CCRN Alumnus RN CM Care Coordination/ Transition of Care- [facility] Care Management [phone]: direct office

## 2023-02-02 NOTE — Progress Notes (Signed)
  Care Coordination   Note   02/02/2023 Name: SHREYAAN GRZYBOWSKI MRN: [id number] DOB: [birth year]  [name] is a 89 y.o. year old male who sees Etta Grandchild, MD for primary care. I reached out to [employer] by phone today to offer care coordination services.  Mr. Kurek was given information about Care Coordination services today including:   The Care Coordination services include support from the care team which includes your Nurse Coordinator, Clinical Social Worker, or Pharmacist.  The Care Coordination team is here to help remove barriers to the health concerns and goals most important to you. Care Coordination services are voluntary, and the patient may decline or stop services at any time by request to their care team member.   Care Coordination Consent Status: Patient spouseagreed to services and verbal consent obtained.   Follow up plan:  Telephone appointment with care coordination team member scheduled for:  02/07/23  Encounter Outcome:  Pt. Scheduled  [facility] Coordination Care Guide  Direct Dial: [phone]

## 2023-02-04 ENCOUNTER — Other Ambulatory Visit: Payer: Self-pay | Admitting: Internal Medicine

## 2023-02-04 DIAGNOSIS — E118 Type 2 diabetes mellitus with unspecified complications: Secondary | ICD-10-CM

## 2023-02-06 ENCOUNTER — Other Ambulatory Visit: Payer: Self-pay

## 2023-02-06 DIAGNOSIS — E785 Hyperlipidemia, unspecified: Secondary | ICD-10-CM

## 2023-02-06 DIAGNOSIS — E119 Type 2 diabetes mellitus without complications: Secondary | ICD-10-CM

## 2023-02-06 MED ORDER — TORSEMIDE 10 MG PO TABS
10.0000 mg | ORAL_TABLET | ORAL | 2 refills | Status: DC
Start: 2023-02-06 — End: 2023-09-21

## 2023-02-06 MED ORDER — COLESEVELAM HCL 3.75 G PO PACK
1.0000 | PACK | Freq: Every day | ORAL | 1 refills | Status: DC
Start: 2023-02-06 — End: 2023-09-21

## 2023-02-06 NOTE — Telephone Encounter (Signed)
Original prescription was listed as no print.  Prescription sent to pharmacy today

## 2023-02-06 NOTE — Addendum Note (Signed)
Addended by: Dossie Arbour on: 02/06/2023 09:15 AM   Modules accepted: Orders

## 2023-02-07 ENCOUNTER — Ambulatory Visit: Payer: Self-pay | Admitting: Licensed Clinical Social Worker

## 2023-02-07 NOTE — Patient Instructions (Signed)
  It was a pleasure speaking with you today. I am sorry you were unable to keep your phone appointment today.   Per your request a Care Coordination phone appointment is scheduled 02/08/23  Sammuel Hines, LCSW Social Work Care Coordination  [phone]

## 2023-02-07 NOTE — Patient Outreach (Signed)
  Care Coordination  Initial Visit Note   02/07/2023 Name: SHEMAR PLEMMONS MRN: [id number] DOB: June 11, 1928  [name] is a 89 y.o. year old male who sees Etta Grandchild, MD for primary care. I spoke with  [name] 's wife Aurea Graff by phone today.  What matters to the patients health and wellness today?  Unable to keep phone appointment today and requested to reschedule.   SDOH assessments and interventions completed:  No   Care Coordination Interventions:  No, not indicated   Follow up plan: Follow up call scheduled for 02/08/23    Encounter Outcome:  Pt. Visit Completed   Sammuel Hines, LCSW Social Work Care Coordination  [facility] [employer] [phone]

## 2023-02-08 ENCOUNTER — Ambulatory Visit: Payer: Self-pay | Admitting: Licensed Clinical Social Worker

## 2023-02-08 NOTE — Patient Outreach (Signed)
  Care Coordination  Initial Visit Note   02/08/2023 Name: MATTSON DAYAL MRN: [id number] DOB: [birth year]  [name] is a 89 y.o. year old male who sees Etta Grandchild, MD for primary care. I spoke with  Consepcion Hearing Fluitt by phone today.  What matters to the patients health and wellness today?  Providing caregiver support  Patient' wife is experiencing caregiver stress. Reports doing better since husband had an aide 5 days a week.   Goals Addressed             This Visit's Progress    COMPLETED: Reduce care giver stress       Activities and task to complete in order to accomplish goals.   Keep all upcoming appointment discussed today Continue with compliance of taking medication prescribed by Doctor Self Support options  (going shopping; and having time for self care) Complete Advance Directive packet,  Have advance directive notarized and provide a copy to provider office         SDOH assessments and interventions completed:  No   Care Coordination Interventions:  Yes, provided  Interventions Today    Flowsheet Row Most Recent Value  Chronic Disease   Chronic disease during today's visit Hypertension (HTN), Chronic Obstructive Pulmonary Disease (COPD), Diabetes, Chronic Kidney Disease/End Stage Renal Disease (ESRD)  General Interventions   General Interventions Discussed/Reviewed Level of Care  Level of Care Personal Care Services  [has an aide that comes M- F 4:00pm to 6:00pm]  Mental Health Interventions   Mental Health Discussed/Reviewed Mental Health Reviewed, Anxiety, Coping Strategies  [active listening,  emotional support,  solution focused,  problem solving]  Safety Interventions   Safety Discussed/Reviewed Safety Reviewed  Advanced Directive Interventions   Advanced Directives Discussed/Reviewed Advanced Directives Discussed, Advanced Care Planning  [working on this with their sons]       Follow up plan: No further intervention required. LCSW will follow  patient's wife under her chart for ongoing caregiver support.  Encounter Outcome:  Pt. Visit Completed   Sammuel Hines, LCSW Social Work Care Coordination  [facility] [employer] [employer] [phone]

## 2023-02-08 NOTE — Patient Instructions (Signed)
Social Work Visit Information  Thank you for taking time to visit with me today. Please don't hesitate to contact me if I can be of assistance to you.   Following are the goals we discussed today:   Goals Addressed             This Visit's Progress    COMPLETED: Reduce care giver stress       Activities and task to complete in order to accomplish goals.   Keep all upcoming appointment discussed today Continue with compliance of taking medication prescribed by Doctor Self Support options  (going shopping; and having time for self care) Complete Advance Directive packet,  Have advance directive notarized and provide a copy to provider office          No follow up scheduled with social work at this time. Will follow up in 30 days for caregiver support with patient's wife . Wife will call office if needed prior to next encounter.  Please call the care guide team at [phone] if you need to cancel or reschedule your appointment.   If you or anyone you know are experiencing a Mental Health or Behavioral Health Crisis or need someone to talk to, please call the Suicide and Crisis Lifeline: 988 call the [place] National Suicide Prevention Lifeline: 4454067567 or TTY: 857-172-8161 TTY (937) 240-1293) to talk to a trained counselor call 1-800-273-TALK (toll free, 24 hour hotline) go to [facility] [address], [place] [phone])   Patient verbalizes understanding of instructions and care plan provided today and agrees to view in MyChart. Active MyChart status and patient understanding of how to access instructions and care plan via MyChart confirmed with patient.       Sammuel Hines, LCSW Social Work Care Coordination  [facility] [employer] [phone]

## 2023-02-14 DIAGNOSIS — R338 Other retention of urine: Secondary | ICD-10-CM | POA: Diagnosis not present

## 2023-02-16 ENCOUNTER — Telehealth: Payer: Self-pay

## 2023-02-16 DIAGNOSIS — C7951 Secondary malignant neoplasm of bone: Secondary | ICD-10-CM | POA: Diagnosis not present

## 2023-02-16 DIAGNOSIS — C61 Malignant neoplasm of prostate: Secondary | ICD-10-CM | POA: Diagnosis not present

## 2023-02-16 DIAGNOSIS — E1122 Type 2 diabetes mellitus with diabetic chronic kidney disease: Secondary | ICD-10-CM | POA: Diagnosis not present

## 2023-02-16 DIAGNOSIS — I130 Hypertensive heart and chronic kidney disease with heart failure and stage 1 through stage 4 chronic kidney disease, or unspecified chronic kidney disease: Secondary | ICD-10-CM | POA: Diagnosis not present

## 2023-02-16 DIAGNOSIS — I5042 Chronic combined systolic (congestive) and diastolic (congestive) heart failure: Secondary | ICD-10-CM | POA: Diagnosis not present

## 2023-02-16 DIAGNOSIS — N183 Chronic kidney disease, stage 3 unspecified: Secondary | ICD-10-CM | POA: Diagnosis not present

## 2023-02-16 NOTE — Telephone Encounter (Signed)
Contacted pt's son per Dr Candise Che: to let patients son know PSA down to 3.8 showing good ressonse to treatment-- continuing the same.  K 3.4 -- will need to continue to rx potassium replacement with his diuretics with PCP  Pt's son acknowledged information and verbalized understanding.

## 2023-02-17 DIAGNOSIS — H43813 Vitreous degeneration, bilateral: Secondary | ICD-10-CM | POA: Diagnosis not present

## 2023-02-17 DIAGNOSIS — H5316 Psychophysical visual disturbances: Secondary | ICD-10-CM | POA: Diagnosis not present

## 2023-02-17 DIAGNOSIS — H353233 Exudative age-related macular degeneration, bilateral, with inactive scar: Secondary | ICD-10-CM | POA: Diagnosis not present

## 2023-02-17 DIAGNOSIS — H26491 Other secondary cataract, right eye: Secondary | ICD-10-CM | POA: Diagnosis not present

## 2023-02-20 ENCOUNTER — Encounter: Payer: Self-pay | Admitting: Internal Medicine

## 2023-02-20 ENCOUNTER — Ambulatory Visit (INDEPENDENT_AMBULATORY_CARE_PROVIDER_SITE_OTHER): Payer: Medicare Other | Admitting: Internal Medicine

## 2023-02-20 VITALS — BP 104/60 | HR 73 | Temp 98.0°F | Resp 16 | Ht 69.0 in | Wt 166.0 lb

## 2023-02-20 DIAGNOSIS — I5042 Chronic combined systolic (congestive) and diastolic (congestive) heart failure: Secondary | ICD-10-CM | POA: Diagnosis not present

## 2023-02-20 DIAGNOSIS — C7951 Secondary malignant neoplasm of bone: Secondary | ICD-10-CM | POA: Diagnosis not present

## 2023-02-20 DIAGNOSIS — E876 Hypokalemia: Secondary | ICD-10-CM | POA: Diagnosis not present

## 2023-02-20 DIAGNOSIS — E118 Type 2 diabetes mellitus with unspecified complications: Secondary | ICD-10-CM | POA: Diagnosis not present

## 2023-02-20 DIAGNOSIS — C61 Malignant neoplasm of prostate: Secondary | ICD-10-CM | POA: Diagnosis not present

## 2023-02-20 DIAGNOSIS — Z794 Long term (current) use of insulin: Secondary | ICD-10-CM

## 2023-02-20 DIAGNOSIS — N183 Chronic kidney disease, stage 3 unspecified: Secondary | ICD-10-CM | POA: Diagnosis not present

## 2023-02-20 DIAGNOSIS — N400 Enlarged prostate without lower urinary tract symptoms: Secondary | ICD-10-CM | POA: Diagnosis not present

## 2023-02-20 DIAGNOSIS — E119 Type 2 diabetes mellitus without complications: Secondary | ICD-10-CM | POA: Insufficient documentation

## 2023-02-20 LAB — BASIC METABOLIC PANEL
BUN: 19 mg/dL (ref 6–23)
CO2: 29 mEq/L (ref 19–32)
Calcium: 8.7 mg/dL (ref 8.4–10.5)
Chloride: 105 mEq/L (ref 96–112)
Creatinine, Ser: 1.3 mg/dL (ref 0.40–1.50)
GFR: 47.48 mL/min — ABNORMAL LOW (ref >60.00)
Glucose, Bld: 190 mg/dL — ABNORMAL HIGH (ref 70–99)
Potassium: 3.6 mEq/L (ref 3.5–5.1)
Sodium: 141 mEq/L (ref 135–145)

## 2023-02-20 LAB — POCT GLYCOSYLATED HEMOGLOBIN (HGB A1C): Hemoglobin A1C: 8.2 % — AB (ref 4.0–5.6)

## 2023-02-20 LAB — MAGNESIUM: Magnesium: 2.1 mg/dL (ref 1.5–2.5)

## 2023-02-20 MED ORDER — EMPAGLIFLOZIN 10 MG PO TABS
10.0000 mg | ORAL_TABLET | Freq: Every day | ORAL | 1 refills | Status: DC
Start: 2023-02-20 — End: 2023-02-27

## 2023-02-20 MED ORDER — INSULIN PEN NEEDLE 30G X 8 MM MISC
1.0000 | 1 refills | Status: DC | PRN
Start: 2023-02-20 — End: 2024-01-15

## 2023-02-20 MED ORDER — DEXAMETHASONE 4 MG PO TABS
4.0000 mg | ORAL_TABLET | Freq: Every day | ORAL | 3 refills | Status: DC
Start: 2023-02-20 — End: 2023-07-22

## 2023-02-20 MED ORDER — TAMSULOSIN HCL 0.4 MG PO CAPS: 0.4000 mg | ORAL_CAPSULE | Freq: Every day | ORAL | 1 refills | Status: DC

## 2023-02-20 NOTE — Patient Instructions (Signed)

## 2023-02-20 NOTE — Progress Notes (Unsigned)
Subjective:  Patient ID: Nicholas Burton, male    DOB: 28-Mar-1928  Age: 89 y.o. MRN: [id number]  CC: Diabetes   HPI Nicholas Burton presents for f/up ----  Discussed the use of AI scribe software for clinical note transcription with the patient, who gave verbal consent to proceed.  History of Present Illness   The patient, with a history of prostate cancer and diabetes, has been living with a Foley catheter for several months due to prostate swelling. He reports significant discomfort and a painful experience during monthly catheter maintenance. Recently, he had a blockage that required an emergency room visit. The patient's PSA levels have decreased from 93 to 2-3, indicating a positive response to his cancer treatment, which includes monthly injections and radiation therapy.  The patient also reports a recent episode of severe pain, which was resolved. He denies any current pain, specifically in the lower back. He is on multiple medications, including insulin for diabetes.       Outpatient Medications Prior to Visit  Medication Sig Dispense Refill   acetaminophen (TYLENOL) 325 MG tablet Take 1-2 tablets (325-650 mg total) by mouth every 6 (six) hours as needed for mild pain.     atorvastatin (LIPITOR) 10 MG tablet Take 1 tablet (10 mg total) by mouth daily. Pt needs to keep upcoming appt in Oct for further refills 90 tablet 1   cholecalciferol (VITAMIN D) 1000 units tablet Take 1 tablet (1,000 Units total) by mouth daily. 30 tablet 0   Colesevelam HCl 3.75 g PACK Take 1 packet by mouth daily. 60 packet 1   cyanocobalamin (VITAMIN B12) 500 MCG tablet Take 1 tablet (500 mcg total) by mouth daily. 30 tablet 0   enzalutamide (XTANDI) 80 MG tablet Take 1 tablet (80 mg total) by mouth daily. 30 tablet 1   HYDROcodone-acetaminophen (NORCO/VICODIN) 5-325 MG tablet Take 1-2 tablets by mouth every 4 (four) hours as needed for moderate pain. 30 tablet 0   insulin glargine (LANTUS SOLOSTAR) 100 UNIT/ML  Solostar Pen INJECT 30 UNITS UNDER THE SKIN AT BEDTIME 15 mL 1   lidocaine (LIDODERM) 5 % Place 1 patch onto the skin daily. Remove & Discard patch within 12 hours or as directed by MD 30 patch 0   losartan (COZAAR) 25 MG tablet Take 1 tablet (25 mg total) by mouth daily. Pt needs to keep upcoming appt in Oct for further refills 90 tablet 1   magnesium gluconate (MAGONATE) 500 MG tablet Take 1 tablet (500 mg total) by mouth at bedtime. 30 tablet 0   melatonin 3 MG TABS tablet Take 1 tablet (3 mg total) by mouth at bedtime as needed. 30 tablet 0   metoprolol tartrate (LOPRESSOR) 25 MG tablet TAKE ONE-HALF (1/2) TABLET TWICE A DAY 90 tablet 1   Multiple Vitamins-Minerals (PRESERVISION/LUTEIN) CAPS Take 1 capsule by mouth 2 (two) times daily.     potassium chloride SA (KLOR-CON M) 20 MEQ tablet Take 1 tablet (20 mEq total) by mouth 2 (two) times daily. 4 tablet 0   torsemide (DEMADEX) 10 MG tablet Take 1 tablet (10 mg total) by mouth every other day. 45 tablet 2   Vibegron (GEMTESA) 75 MG TABS TAKE 1 TABLET DAILY (Patient taking differently: Take 75 mg by mouth daily.) 90 tablet 1   Vitamin D, Ergocalciferol, (DRISDOL) 1.25 MG (50000 UNIT) CAPS capsule Take 1 capsule (50,000 Units total) by mouth every 7 (seven) days. 5 capsule 0   cefdinir (OMNICEF) 300 MG capsule Take 1  capsule (300 mg total) by mouth 2 (two) times daily. 14 capsule 0   ciprofloxacin-dexamethasone (CIPRODEX) OTIC suspension Instill 4 drops to RIGHT ear twice a day for 7 days 7.5 mL 0   dexamethasone (DECADRON) 4 MG tablet TAKE 1 TABLET(4 MG) BY MOUTH DAILY 30 tablet 0   empagliflozin (JARDIANCE) 10 MG TABS tablet Take 1 tablet (10 mg total) by mouth daily. Take 1/2 tablet by mouth daily 30 tablet 0   tamsulosin (FLOMAX) 0.4 MG CAPS capsule Take 1 capsule (0.4 mg total) by mouth daily. 90 capsule 0   No facility-administered medications prior to visit.    ROS Review of Systems  Objective:  BP 104/60 (BP Location: Right Arm,  Patient Position: Sitting, Cuff Size: Large)   Pulse 73   Temp 98 F (36.7 C) (Oral)   Ht 5\' 9"  (1.753 m)   SpO2 93%   BMI 24.51 kg/m   BP Readings from Last 3 Encounters:  02/20/23 104/60  01/26/23 131/82  01/26/23 120/70    Wt Readings from Last 3 Encounters:  01/25/23 166 lb (75.3 kg)  01/17/23 166 lb (75.3 kg)  11/28/22 165 lb (74.8 kg)    Physical Exam  Lab Results  Component Value Date   WBC 10.2 01/26/2023   HGB 13.1 01/26/2023   HCT 40.2 01/26/2023   PLT 213 01/26/2023   GLUCOSE 245 (H) 01/26/2023   CHOL 83 09/17/2019   TRIG 136.0 09/17/2019   HDL 36.40 (L) 09/17/2019   LDLCALC 19 09/17/2019   ALT 15 01/26/2023   AST 19 01/26/2023   NA 138 01/26/2023   K 2.9 (L) 01/26/2023   CL 103 01/26/2023   CREATININE 1.15 01/26/2023   BUN 20 01/26/2023   CO2 25 01/26/2023   TSH 3.821 10/06/2022   PSA 11.21 (H) 10/30/2017   INR 1.1 10/07/2022   HGBA1C 8.2 (A) 02/20/2023   MICROALBUR 0.7 09/17/2019    DG Chest Port 1 View  Result Date: 01/26/2023 CLINICAL DATA:  Altered mental status EXAM: PORTABLE CHEST 1 VIEW COMPARISON:  11/28/2022 FINDINGS: Small left pleural effusion or thickening as before. No consolidation. Stable cardiomediastinal silhouette. No pneumothorax IMPRESSION: No active disease. Small left pleural effusion or thickening. Electronically Signed   By: Jasmine Pang M.D.   On: 01/26/2023 20:51    Assessment & Plan:  Type 2 diabetes mellitus with other specified complication, with long-term current use of insulin (HCC)  Prostate cancer metastatic to bone (HCC) -     dexAMETHasone; Take 1 tablet (4 mg total) by mouth daily.  Dispense: 30 tablet; Refill: 3  Type II diabetes mellitus with manifestations (HCC) -     Empagliflozin; Take 1 tablet (10 mg total) by mouth daily.  Dispense: 90 tablet; Refill: 1 -     POCT glycosylated hemoglobin (Hb A1C)  Chronic combined systolic and diastolic congestive heart failure (HCC) -     Empagliflozin; Take 1  tablet (10 mg total) by mouth daily.  Dispense: 90 tablet; Refill: 1  CRI (chronic renal insufficiency), stage 3 (moderate) -     Empagliflozin; Take 1 tablet (10 mg total) by mouth daily.  Dispense: 90 tablet; Refill: 1  Benign prostatic hyperplasia without lower urinary tract symptoms -     Tamsulosin HCl; Take 1 capsule (0.4 mg total) by mouth daily.  Dispense: 90 capsule; Refill: 1  Chronic hypokalemia -     Magnesium; Future -     Basic metabolic panel; Future  Insulin-requiring or dependent type II diabetes  mellitus (HCC) -     Insulin Pen Needle; Inject 10 each into the skin as needed.  Dispense: 100 each; Refill: 1     Follow-up: Return in about 4 months (around 06/22/2023).  Sanda Linger, MD

## 2023-02-23 ENCOUNTER — Telehealth: Payer: Self-pay | Admitting: Internal Medicine

## 2023-02-23 NOTE — Telephone Encounter (Signed)
Spoke to [place] at [facility] informed him that needle should be used with Lantus at bedtime per Epic.

## 2023-02-23 NOTE — Telephone Encounter (Signed)
[facility] called and needs clarification on the novafine pen needles - it just says to inject 10 each into the skin  Please call:  [phone]

## 2023-02-24 ENCOUNTER — Ambulatory Visit: Payer: Medicare Other | Attending: Interventional Cardiology | Admitting: Interventional Cardiology

## 2023-02-24 VITALS — BP 138/66 | HR 64 | Ht 69.0 in | Wt 165.6 lb

## 2023-02-24 DIAGNOSIS — E785 Hyperlipidemia, unspecified: Secondary | ICD-10-CM | POA: Insufficient documentation

## 2023-02-24 DIAGNOSIS — R600 Localized edema: Secondary | ICD-10-CM | POA: Insufficient documentation

## 2023-02-24 DIAGNOSIS — N183 Chronic kidney disease, stage 3 unspecified: Secondary | ICD-10-CM | POA: Insufficient documentation

## 2023-02-24 DIAGNOSIS — Z794 Long term (current) use of insulin: Secondary | ICD-10-CM | POA: Diagnosis not present

## 2023-02-24 DIAGNOSIS — E118 Type 2 diabetes mellitus with unspecified complications: Secondary | ICD-10-CM | POA: Insufficient documentation

## 2023-02-24 DIAGNOSIS — I10 Essential (primary) hypertension: Secondary | ICD-10-CM | POA: Insufficient documentation

## 2023-02-24 DIAGNOSIS — I5042 Chronic combined systolic (congestive) and diastolic (congestive) heart failure: Secondary | ICD-10-CM | POA: Diagnosis not present

## 2023-02-24 NOTE — Progress Notes (Addendum)
Cardiology Office Note   Date:  02/24/2023   ID:  Nicholas Burton, DOB 1928/06/03, MRN [id number]  PCP:  Etta Grandchild, MD    No chief complaint on file.  Chronic combined heart failure  Wt Readings from Last 3 Encounters:  02/24/23 165 lb 9.6 oz (75.1 kg)  02/20/23 166 lb (75.3 kg)  01/25/23 166 lb (75.3 kg)       History of Present Illness: Nicholas Burton is a 89 y.o. male   with chronic systolic heart failure, who worked in the Development worker, community at [employer]. Retired many years ago.  His daughter-in-law works in our office at [facility]   In 2020, he was working around the house and had more DOE.  No CP.     He had an echo showing EF 35%.     He was started on a diuretic.  He did well with a diuretic.   I then had a telephone visit with him in 03/2019 and an ARB was started.  We discussed cardiac catheterization versus stress testing versus medical therapy.  Given his lack of symptoms, we opted for no testing.  The patient and his wife were nervous about coming into the office for any testing regardless.  Home health came out and did some blood work showing stable creatinine.  They showed a stable blood pressure and heart rate in the 90s.  The plan was then going to be to add a beta-blocker.  He did have some mild renal insufficiency which may interfere with uptitrating ARB.   Echo 03/2019:    1. The left ventricle has normal systolic function with an ejection fraction of 60-65%. The cavity size was normal. There is mildly increased left ventricular wall thickness. Left ventricular diastolic Doppler parameters are consistent with impaired  relaxation.  2. The right ventricle has normal systolic function. The cavity was normal.  3. The mitral valve is grossly normal.  4. The tricuspid valve is grossly normal.  5. The aortic valve is tricuspid. Mild thickening of the aortic valve. No stenosis of the aortic valve.  6. The aorta is abnormal in size and structure.  7. There is  mild dilatation of the ascending aorta measuring 41 mm.  8. Mild to moderate global reduction in LV systolic function; mild diastolic dysfunction; mildly dilated ascending aorta.   In 2022, it was noted: "He has had some balance issues.  He had an episode of bronchitis and had to take prednisone.  His blood pressure increased when he was on the prednisone.  It has since normalized. He has decreased his diuretic frequency on his own.  He does note some lower extremity swelling."  Foley catheter for prostate cancer issues.  Hoping that treatment will help shrink the prostate.    Denies : Chest pain. Dizziness.  Nitroglycerin use. Orthopnea. Palpitations. Paroxysmal nocturnal dyspnea. Shortness of breath. Syncope.    Past Medical History:  Diagnosis Date   Allergy    ANEMIA ASSOCIATED W/OTHER [facility] NUTRITIONAL DEFIC 08/11/2010   Qualifier: Diagnosis of  By: Yetta Barre MD, Bernadene Bell.    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    History of skin cancer    Hyperlipidemia with target LDL less than 100 11/10/2008        Hypertension    Memory loss 05/14/2009   Obesity (BMI 30.0-34.9) 04/01/2013   Osteoarthritis    PSA elevation 10/25/2018   SKIN CANCER, HX OF 11/10/2008   Qualifier: Diagnosis of  By: Yetta Barre MD, Bernadene Bell.    Snoring 04/03/2014   Type II diabetes mellitus with manifestations (HCC) 11/10/2008   Estimated Creatinine Clearance: 38 mL/min (A) (by C-G formula based on SCr of 1.52 mg/dL (H)).    Venous stasis dermatitis of both lower extremities 01/29/2018   Vitamin D deficiency 11/10/2008    Past Surgical History:  Procedure Laterality Date    CHOLECYSTECTOMY     INGUINAL HERNIA REPAIR     x 3   IR FLUORO GUIDED NEEDLE PLC ASPIRATION/INJECTION LOC  10/07/2022   JOINT REPLACEMENT     KNEE SURGERY     x 2   MELANOMA EXCISION     x 3 -- Left arm   TOTAL KNEE ARTHROPLASTY     x 3     Current Outpatient Medications  Medication Sig Dispense Refill   acetaminophen (TYLENOL) 325 MG tablet Take 1-2 tablets (325-650 mg total) by mouth every 6 (six) hours as needed for mild pain.     atorvastatin (LIPITOR) 10 MG tablet Take 1 tablet (10 mg total) by mouth daily. Pt needs to keep upcoming appt in Oct for further refills 90 tablet 1   cholecalciferol (VITAMIN D) 1000 units tablet Take 1 tablet (1,000 Units total) by mouth daily. 30 tablet 0   Colesevelam HCl 3.75 g PACK Take 1 packet by mouth daily. 60 packet 1   cyanocobalamin (VITAMIN B12) 500 MCG tablet Take 1 tablet (500 mcg total) by mouth daily. 30 tablet 0   dexamethasone (DECADRON) 4 MG tablet Take 1 tablet (4 mg total) by mouth daily. 30 tablet 3   empagliflozin (JARDIANCE) 10 MG TABS tablet Take 1 tablet (10 mg total) by mouth daily. 90 tablet 1   enzalutamide (XTANDI) 80 MG tablet Take 1 tablet (80 mg total) by mouth daily. 30 tablet 1   HYDROcodone-acetaminophen (NORCO/VICODIN) 5-325 MG tablet Take 1-2 tablets by mouth every 4 (four) hours as needed for moderate pain. 30 tablet 0   insulin glargine (LANTUS SOLOSTAR) 100 UNIT/ML Solostar Pen INJECT 30 UNITS UNDER THE SKIN AT BEDTIME 15 mL 1   Insulin Pen Needle (NOVOFINE) 30G X 8 MM MISC Inject 10 each into the skin as needed. 100 each 1   lidocaine (LIDODERM) 5 % Place 1 patch onto the skin daily. Remove & Discard patch within 12 hours or as directed by MD 30 patch 0   losartan (COZAAR) 25 MG tablet Take 1 tablet (25 mg total) by mouth daily. Pt needs to keep upcoming appt in Oct for further refills 90 tablet 1   magnesium gluconate (MAGONATE) 500 MG tablet Take 1 tablet (500 mg total) by mouth at bedtime. 30 tablet 0    melatonin 3 MG TABS tablet Take 1 tablet (3 mg total) by mouth at bedtime as needed. 30 tablet 0   metoprolol tartrate (LOPRESSOR) 25 MG tablet TAKE ONE-HALF (1/2) TABLET TWICE A DAY 90 tablet 1   Multiple Vitamins-Minerals (PRESERVISION/LUTEIN) CAPS Take 1 capsule by mouth 2 (two) times daily.     potassium chloride SA (KLOR-CON M) 20 MEQ tablet Take 1  tablet (20 mEq total) by mouth 2 (two) times daily. 4 tablet 0   tamsulosin (FLOMAX) 0.4 MG CAPS capsule Take 1 capsule (0.4 mg total) by mouth daily. 90 capsule 1   torsemide (DEMADEX) 10 MG tablet Take 1 tablet (10 mg total) by mouth every other day. 45 tablet 2   Vibegron (GEMTESA) 75 MG TABS TAKE 1 TABLET DAILY (Patient taking differently: Take 75 mg by mouth daily.) 90 tablet 1   Vitamin D, Ergocalciferol, (DRISDOL) 1.25 MG (50000 UNIT) CAPS capsule Take 1 capsule (50,000 Units total) by mouth every 7 (seven) days. 5 capsule 0   No current facility-administered medications for this visit.    Allergies:   Ace inhibitors, Oxycodone, and Penicillins    Social History:  The patient  reports that he has never smoked. He has never used smokeless tobacco. He reports that he does not drink alcohol and does not use drugs.   Family History:  The patient's family history includes Cancer in his brother and sister; Diabetes in his mother, son, and another family member; Heart disease in his father and mother.    ROS:  Please see the history of present illness.   Otherwise, review of systems are positive for occasional leg swelling.   All other systems are reviewed and negative.    PHYSICAL EXAM: VS:  BP 138/66   Pulse 64   Ht 5\' 9"  (1.753 m)   Wt 165 lb 9.6 oz (75.1 kg)   SpO2 96%   BMI 24.45 kg/m  , BMI Body mass index is 24.45 kg/m. GEN: Well nourished, well developed, in no acute distress; foley catheter in place HEENT: normal Neck: no JVD, carotid bruits, or masses Cardiac: RRR; no murmurs, rubs, or gallops, 1+ bilateral leg edema   Respiratory:  clear to auscultation bilaterally, normal work of breathing GI: soft, nontender, nondistended, + BS MS: no deformity or atrophy Skin: warm and dry, no rash Neuro:  Strength and sensation are intact Psych: euthymic mood, full affect   EKG:   The ekg ordered today demonstrates NSR RBBB, bifascicular block   Recent Labs: 07/26/2022: Pro B Natriuretic peptide (BNP) 59.0 10/06/2022: TSH 3.821 11/28/2022: B Natriuretic Peptide 63.8 01/26/2023: ALT 15; Hemoglobin 13.1; Platelets 213 02/20/2023: BUN 19; Creatinine, Ser 1.30; Magnesium 2.1; Potassium 3.6; Sodium 141   Lipid Panel    Component Value Date/Time   CHOL 83 09/17/2019 1028   TRIG 136.0 09/17/2019 1028   HDL 36.40 (L) 09/17/2019 1028   CHOLHDL 2 09/17/2019 1028   VLDL 27.2 09/17/2019 1028   LDLCALC 19 09/17/2019 1028     Other studies Reviewed: Additional studies/ records that were reviewed today with results demonstrating: labs reviewed.   ASSESSMENT AND PLAN:  Chronic combined heart failure: Resolved by echo in 2020.  Continue current therapy.  Chronic renal disease: Cr 1.3.  Stay hydrated. Avoid nephrotoxins. Lower extremity edema: Mild, elevate legs.  Uses compression stockings. Hypertension: The current medical regimen is effective;  continue present plan and medications.  Type 2 diabetes: Given diabetes, statin was started but we did not go to high intensity due to his desire to try to minimize medications.  A1C 8.2 in June 2024.   Current medicines are reviewed at length with the patient today.  The patient concerns regarding his medicines were addressed.  The following changes have been made:  No change  Labs/ tests ordered today include:  No orders of the defined types were placed in this encounter.   Recommend 150  minutes/week of aerobic exercise Low fat, low carb, high fiber diet recommended  Disposition:   FU in 1 year   Signed, Lance Muss, MD  02/24/2023 9:52 AM    [facility] [address] [place], [place], [place]  [address] Phone: [phone]; Fax: [phone]

## 2023-02-24 NOTE — Patient Instructions (Signed)
Medication Instructions:  Your physician recommends that you continue on your current medications as directed. Please refer to the Current Medication list given to you today.  *If you need a refill on your cardiac medications before your next appointment, please call your pharmacy*   Lab Work: none If you have labs (blood work) drawn today and your tests are completely normal, you will receive your results only by: MyChart Message (if you have MyChart) OR A paper copy in the mail If you have any lab test that is abnormal or we need to change your treatment, we will call you to review the results.   Testing/Procedures: none   Follow-Up: At [facility], you and your health needs are our priority.  As part of our continuing mission to provide you with exceptional heart care, we have created designated Provider Care Teams.  These Care Teams include your primary Cardiologist (physician) and Advanced Practice Providers (APPs -  Physician Assistants and Nurse Practitioners) who all work together to provide you with the care you need, when you need it.  We recommend signing up for the patient portal called "MyChart".  Sign up information is provided on this After Visit Summary.  MyChart is used to connect with patients for Virtual Visits (Telemedicine).  Patients are able to view lab/test results, encounter notes, upcoming appointments, etc.  Non-urgent messages can be sent to your provider as well.   To learn more about what you can do with MyChart, go to [email or web address].    Your next appointment:   12 month(s)  Provider:   Jayadeep Varanasi, MD     Other Instructions    

## 2023-02-27 ENCOUNTER — Other Ambulatory Visit: Payer: Self-pay | Admitting: Internal Medicine

## 2023-02-27 DIAGNOSIS — I5042 Chronic combined systolic (congestive) and diastolic (congestive) heart failure: Secondary | ICD-10-CM

## 2023-02-27 DIAGNOSIS — N183 Chronic kidney disease, stage 3 unspecified: Secondary | ICD-10-CM

## 2023-02-27 DIAGNOSIS — E118 Type 2 diabetes mellitus with unspecified complications: Secondary | ICD-10-CM

## 2023-03-07 ENCOUNTER — Other Ambulatory Visit: Payer: Medicare Other

## 2023-03-10 ENCOUNTER — Inpatient Hospital Stay: Payer: Medicare Other

## 2023-03-10 ENCOUNTER — Inpatient Hospital Stay: Payer: Medicare Other | Admitting: Hematology

## 2023-03-10 ENCOUNTER — Other Ambulatory Visit: Payer: Self-pay | Admitting: Physician Assistant

## 2023-03-10 ENCOUNTER — Inpatient Hospital Stay: Payer: Medicare Other | Attending: Hematology

## 2023-03-10 ENCOUNTER — Other Ambulatory Visit: Payer: Self-pay

## 2023-03-10 VITALS — BP 112/80 | HR 68 | Temp 98.5°F | Resp 16

## 2023-03-10 DIAGNOSIS — Z5111 Encounter for antineoplastic chemotherapy: Secondary | ICD-10-CM | POA: Insufficient documentation

## 2023-03-10 DIAGNOSIS — C7951 Secondary malignant neoplasm of bone: Secondary | ICD-10-CM

## 2023-03-10 DIAGNOSIS — C61 Malignant neoplasm of prostate: Secondary | ICD-10-CM | POA: Insufficient documentation

## 2023-03-10 LAB — CMP (CANCER CENTER ONLY)
ALT: 14 U/L (ref 0–44)
AST: 16 U/L (ref 15–41)
Albumin: 3.4 g/dL — ABNORMAL LOW (ref 3.5–5.0)
Alkaline Phosphatase: 124 U/L (ref 38–126)
Anion gap: 6 (ref 5–15)
BUN: 25 mg/dL — ABNORMAL HIGH (ref 8–23)
CO2: 27 mmol/L (ref 22–32)
Calcium: 9 mg/dL (ref 8.9–10.3)
Chloride: 109 mmol/L (ref 98–111)
Creatinine: 1.26 mg/dL — ABNORMAL HIGH (ref 0.61–1.24)
GFR, Estimated: 53 mL/min — ABNORMAL LOW
Glucose, Bld: 199 mg/dL — ABNORMAL HIGH (ref 70–99)
Potassium: 4 mmol/L (ref 3.5–5.1)
Sodium: 142 mmol/L (ref 135–145)
Total Bilirubin: 0.4 mg/dL (ref 0.3–1.2)
Total Protein: 5.9 g/dL — ABNORMAL LOW (ref 6.5–8.1)

## 2023-03-10 LAB — CBC WITH DIFFERENTIAL (CANCER CENTER ONLY)
Abs Immature Granulocytes: 0.06 K/uL (ref 0.00–0.07)
Basophils Absolute: 0 K/uL (ref 0.0–0.1)
Basophils Relative: 0 %
Eosinophils Absolute: 0.2 K/uL (ref 0.0–0.5)
Eosinophils Relative: 2 %
HCT: 38.5 % — ABNORMAL LOW (ref 39.0–52.0)
Hemoglobin: 12.5 g/dL — ABNORMAL LOW (ref 13.0–17.0)
Immature Granulocytes: 1 %
Lymphocytes Relative: 13 %
Lymphs Abs: 1.5 K/uL (ref 0.7–4.0)
MCH: 28.8 pg (ref 26.0–34.0)
MCHC: 32.5 g/dL (ref 30.0–36.0)
MCV: 88.7 fL (ref 80.0–100.0)
Monocytes Absolute: 0.8 K/uL (ref 0.1–1.0)
Monocytes Relative: 7 %
Neutro Abs: 8.7 K/uL — ABNORMAL HIGH (ref 1.7–7.7)
Neutrophils Relative %: 77 %
Platelet Count: 182 K/uL (ref 150–400)
RBC: 4.34 MIL/uL (ref 4.22–5.81)
RDW: 15.7 % — ABNORMAL HIGH (ref 11.5–15.5)
WBC Count: 11.2 K/uL — ABNORMAL HIGH (ref 4.0–10.5)
nRBC: 0 % (ref 0.0–0.2)

## 2023-03-10 MED ORDER — LEUPROLIDE ACETATE (3 MONTH) 22.5 MG ~~LOC~~ KIT
22.5000 mg | PACK | Freq: Once | SUBCUTANEOUS | Status: AC
  Administered 2023-03-10: 22.5 mg via SUBCUTANEOUS
  Filled 2023-03-10: qty 22.5

## 2023-03-13 ENCOUNTER — Other Ambulatory Visit: Payer: Self-pay | Admitting: Hematology

## 2023-03-13 DIAGNOSIS — C61 Malignant neoplasm of prostate: Secondary | ICD-10-CM

## 2023-03-16 LAB — PROSTATE-SPECIFIC AG, SERUM (LABCORP): Prostate Specific Ag, Serum: 2.6 ng/mL (ref 0.0–4.0)

## 2023-03-17 DIAGNOSIS — C61 Malignant neoplasm of prostate: Secondary | ICD-10-CM | POA: Diagnosis not present

## 2023-03-17 DIAGNOSIS — R338 Other retention of urine: Secondary | ICD-10-CM | POA: Diagnosis not present

## 2023-03-17 NOTE — Progress Notes (Signed)
Contacted pt's son per Georga Kaufmann to let him know:  that  pt's PSA levels are normal.       Pt's son acknowledged information and verbalized understanding

## 2023-03-21 DIAGNOSIS — G301 Alzheimer's disease with late onset: Secondary | ICD-10-CM | POA: Diagnosis not present

## 2023-03-22 ENCOUNTER — Telehealth: Payer: Self-pay | Admitting: Internal Medicine

## 2023-03-22 NOTE — Telephone Encounter (Signed)
Verbal orders given as requested. 

## 2023-03-22 NOTE — Telephone Encounter (Signed)
Erin called from [facility] wanting verbals for skilled nursing for med management for PT for strengthen.  Fax [phone] Phone [phone]

## 2023-03-23 ENCOUNTER — Telehealth: Payer: Self-pay | Admitting: Internal Medicine

## 2023-03-23 DIAGNOSIS — N183 Chronic kidney disease, stage 3 unspecified: Secondary | ICD-10-CM | POA: Diagnosis not present

## 2023-03-23 DIAGNOSIS — Z9181 History of falling: Secondary | ICD-10-CM | POA: Diagnosis not present

## 2023-03-23 DIAGNOSIS — C61 Malignant neoplasm of prostate: Secondary | ICD-10-CM | POA: Diagnosis not present

## 2023-03-23 DIAGNOSIS — N400 Enlarged prostate without lower urinary tract symptoms: Secondary | ICD-10-CM | POA: Diagnosis not present

## 2023-03-23 DIAGNOSIS — E1169 Type 2 diabetes mellitus with other specified complication: Secondary | ICD-10-CM | POA: Diagnosis not present

## 2023-03-23 DIAGNOSIS — I5042 Chronic combined systolic (congestive) and diastolic (congestive) heart failure: Secondary | ICD-10-CM | POA: Diagnosis not present

## 2023-03-23 DIAGNOSIS — C7951 Secondary malignant neoplasm of bone: Secondary | ICD-10-CM | POA: Diagnosis not present

## 2023-03-23 DIAGNOSIS — Z794 Long term (current) use of insulin: Secondary | ICD-10-CM | POA: Diagnosis not present

## 2023-03-23 NOTE — Telephone Encounter (Signed)
HH ORDERS   Caller Name: [facility] Agency Name: Heywood Iles Phone #: [phone](secure)  Service Requested: Nursing (examples: OT/PT/Skilled Nursing/Social Work/Speech Therapy/Wound Care)  Frequency of Visits: 1 week 9

## 2023-03-24 ENCOUNTER — Telehealth: Payer: Self-pay | Admitting: Internal Medicine

## 2023-03-24 DIAGNOSIS — I5042 Chronic combined systolic (congestive) and diastolic (congestive) heart failure: Secondary | ICD-10-CM | POA: Diagnosis not present

## 2023-03-24 DIAGNOSIS — C61 Malignant neoplasm of prostate: Secondary | ICD-10-CM | POA: Diagnosis not present

## 2023-03-24 DIAGNOSIS — C7951 Secondary malignant neoplasm of bone: Secondary | ICD-10-CM | POA: Diagnosis not present

## 2023-03-24 DIAGNOSIS — N183 Chronic kidney disease, stage 3 unspecified: Secondary | ICD-10-CM | POA: Diagnosis not present

## 2023-03-24 DIAGNOSIS — E1169 Type 2 diabetes mellitus with other specified complication: Secondary | ICD-10-CM | POA: Diagnosis not present

## 2023-03-24 DIAGNOSIS — Z794 Long term (current) use of insulin: Secondary | ICD-10-CM | POA: Diagnosis not present

## 2023-03-24 NOTE — Telephone Encounter (Signed)
Angela Burke gave MD response...Raechel Chute

## 2023-03-24 NOTE — Telephone Encounter (Signed)
Ok with me 

## 2023-03-24 NOTE — Telephone Encounter (Signed)
Nicholas Burton with [facility] called states they did the home health physical therapy evaluation and they plan to start next week. States the patients random blood sugar reading was 351. Best callback number is [phone].

## 2023-03-28 ENCOUNTER — Telehealth: Payer: Self-pay | Admitting: Internal Medicine

## 2023-03-28 DIAGNOSIS — N183 Chronic kidney disease, stage 3 unspecified: Secondary | ICD-10-CM | POA: Diagnosis not present

## 2023-03-28 DIAGNOSIS — E1169 Type 2 diabetes mellitus with other specified complication: Secondary | ICD-10-CM | POA: Diagnosis not present

## 2023-03-28 DIAGNOSIS — Z794 Long term (current) use of insulin: Secondary | ICD-10-CM | POA: Diagnosis not present

## 2023-03-28 DIAGNOSIS — C61 Malignant neoplasm of prostate: Secondary | ICD-10-CM | POA: Diagnosis not present

## 2023-03-28 DIAGNOSIS — C7951 Secondary malignant neoplasm of bone: Secondary | ICD-10-CM | POA: Diagnosis not present

## 2023-03-28 DIAGNOSIS — I5042 Chronic combined systolic (congestive) and diastolic (congestive) heart failure: Secondary | ICD-10-CM | POA: Diagnosis not present

## 2023-03-28 NOTE — Telephone Encounter (Signed)
Called [place], LVM to discuss.

## 2023-03-28 NOTE — Telephone Encounter (Signed)
Prm R  from [facility] called  Patient has gained 5 lb.s since the 26th.  Please advise  [phone] or call patient at:  [phone]

## 2023-03-29 DIAGNOSIS — Z794 Long term (current) use of insulin: Secondary | ICD-10-CM | POA: Diagnosis not present

## 2023-03-29 DIAGNOSIS — N183 Chronic kidney disease, stage 3 unspecified: Secondary | ICD-10-CM | POA: Diagnosis not present

## 2023-03-29 DIAGNOSIS — E1169 Type 2 diabetes mellitus with other specified complication: Secondary | ICD-10-CM | POA: Diagnosis not present

## 2023-03-29 DIAGNOSIS — C7951 Secondary malignant neoplasm of bone: Secondary | ICD-10-CM | POA: Diagnosis not present

## 2023-03-29 DIAGNOSIS — C61 Malignant neoplasm of prostate: Secondary | ICD-10-CM | POA: Diagnosis not present

## 2023-03-29 DIAGNOSIS — I5042 Chronic combined systolic (congestive) and diastolic (congestive) heart failure: Secondary | ICD-10-CM | POA: Diagnosis not present

## 2023-03-30 DIAGNOSIS — N183 Chronic kidney disease, stage 3 unspecified: Secondary | ICD-10-CM | POA: Diagnosis not present

## 2023-03-30 DIAGNOSIS — G301 Alzheimer's disease with late onset: Secondary | ICD-10-CM | POA: Diagnosis not present

## 2023-03-30 DIAGNOSIS — C7951 Secondary malignant neoplasm of bone: Secondary | ICD-10-CM | POA: Diagnosis not present

## 2023-03-30 DIAGNOSIS — Z794 Long term (current) use of insulin: Secondary | ICD-10-CM | POA: Diagnosis not present

## 2023-03-30 DIAGNOSIS — I5042 Chronic combined systolic (congestive) and diastolic (congestive) heart failure: Secondary | ICD-10-CM | POA: Diagnosis not present

## 2023-03-30 DIAGNOSIS — C61 Malignant neoplasm of prostate: Secondary | ICD-10-CM | POA: Diagnosis not present

## 2023-03-30 DIAGNOSIS — E1169 Type 2 diabetes mellitus with other specified complication: Secondary | ICD-10-CM | POA: Diagnosis not present

## 2023-04-03 DIAGNOSIS — Z794 Long term (current) use of insulin: Secondary | ICD-10-CM | POA: Diagnosis not present

## 2023-04-03 DIAGNOSIS — C61 Malignant neoplasm of prostate: Secondary | ICD-10-CM | POA: Diagnosis not present

## 2023-04-03 DIAGNOSIS — E1169 Type 2 diabetes mellitus with other specified complication: Secondary | ICD-10-CM | POA: Diagnosis not present

## 2023-04-03 DIAGNOSIS — N183 Chronic kidney disease, stage 3 unspecified: Secondary | ICD-10-CM | POA: Diagnosis not present

## 2023-04-03 DIAGNOSIS — I5042 Chronic combined systolic (congestive) and diastolic (congestive) heart failure: Secondary | ICD-10-CM | POA: Diagnosis not present

## 2023-04-03 DIAGNOSIS — C7951 Secondary malignant neoplasm of bone: Secondary | ICD-10-CM | POA: Diagnosis not present

## 2023-04-05 DIAGNOSIS — N183 Chronic kidney disease, stage 3 unspecified: Secondary | ICD-10-CM | POA: Diagnosis not present

## 2023-04-05 DIAGNOSIS — C7951 Secondary malignant neoplasm of bone: Secondary | ICD-10-CM | POA: Diagnosis not present

## 2023-04-05 DIAGNOSIS — Z794 Long term (current) use of insulin: Secondary | ICD-10-CM | POA: Diagnosis not present

## 2023-04-05 DIAGNOSIS — C61 Malignant neoplasm of prostate: Secondary | ICD-10-CM | POA: Diagnosis not present

## 2023-04-05 DIAGNOSIS — E1169 Type 2 diabetes mellitus with other specified complication: Secondary | ICD-10-CM | POA: Diagnosis not present

## 2023-04-05 DIAGNOSIS — I5042 Chronic combined systolic (congestive) and diastolic (congestive) heart failure: Secondary | ICD-10-CM | POA: Diagnosis not present

## 2023-04-10 DIAGNOSIS — C7951 Secondary malignant neoplasm of bone: Secondary | ICD-10-CM | POA: Diagnosis not present

## 2023-04-10 DIAGNOSIS — N183 Chronic kidney disease, stage 3 unspecified: Secondary | ICD-10-CM | POA: Diagnosis not present

## 2023-04-10 DIAGNOSIS — C61 Malignant neoplasm of prostate: Secondary | ICD-10-CM | POA: Diagnosis not present

## 2023-04-10 DIAGNOSIS — I5042 Chronic combined systolic (congestive) and diastolic (congestive) heart failure: Secondary | ICD-10-CM | POA: Diagnosis not present

## 2023-04-10 DIAGNOSIS — Z794 Long term (current) use of insulin: Secondary | ICD-10-CM | POA: Diagnosis not present

## 2023-04-10 DIAGNOSIS — E1169 Type 2 diabetes mellitus with other specified complication: Secondary | ICD-10-CM | POA: Diagnosis not present

## 2023-04-12 DIAGNOSIS — E1169 Type 2 diabetes mellitus with other specified complication: Secondary | ICD-10-CM | POA: Diagnosis not present

## 2023-04-12 DIAGNOSIS — N183 Chronic kidney disease, stage 3 unspecified: Secondary | ICD-10-CM | POA: Diagnosis not present

## 2023-04-12 DIAGNOSIS — I5042 Chronic combined systolic (congestive) and diastolic (congestive) heart failure: Secondary | ICD-10-CM | POA: Diagnosis not present

## 2023-04-12 DIAGNOSIS — C61 Malignant neoplasm of prostate: Secondary | ICD-10-CM | POA: Diagnosis not present

## 2023-04-12 DIAGNOSIS — C7951 Secondary malignant neoplasm of bone: Secondary | ICD-10-CM | POA: Diagnosis not present

## 2023-04-12 DIAGNOSIS — Z794 Long term (current) use of insulin: Secondary | ICD-10-CM | POA: Diagnosis not present

## 2023-04-14 ENCOUNTER — Telehealth: Payer: Self-pay | Admitting: Internal Medicine

## 2023-04-14 NOTE — Telephone Encounter (Signed)
Pt wife called sating pt Blood Sugar has been elevated for a few days and wanted to if Dr. Yetta Barre could increase his insulin. Please advise.

## 2023-04-17 DIAGNOSIS — R338 Other retention of urine: Secondary | ICD-10-CM | POA: Diagnosis not present

## 2023-04-18 ENCOUNTER — Telehealth: Payer: Self-pay | Admitting: Internal Medicine

## 2023-04-18 DIAGNOSIS — E1169 Type 2 diabetes mellitus with other specified complication: Secondary | ICD-10-CM | POA: Diagnosis not present

## 2023-04-18 DIAGNOSIS — I5042 Chronic combined systolic (congestive) and diastolic (congestive) heart failure: Secondary | ICD-10-CM | POA: Diagnosis not present

## 2023-04-18 DIAGNOSIS — C61 Malignant neoplasm of prostate: Secondary | ICD-10-CM | POA: Diagnosis not present

## 2023-04-18 DIAGNOSIS — N183 Chronic kidney disease, stage 3 unspecified: Secondary | ICD-10-CM | POA: Diagnosis not present

## 2023-04-18 DIAGNOSIS — C7951 Secondary malignant neoplasm of bone: Secondary | ICD-10-CM | POA: Diagnosis not present

## 2023-04-18 DIAGNOSIS — Z794 Long term (current) use of insulin: Secondary | ICD-10-CM | POA: Diagnosis not present

## 2023-04-18 NOTE — Telephone Encounter (Signed)
FYI Heather from [facility] states the pt's blood sugar was at 337 today and when she looked at his log over the past month he is consistently in the 300's.   Please call Herbert Seta with any questions:  [phone]

## 2023-04-18 NOTE — Telephone Encounter (Signed)
LVM for Heather to discuss.

## 2023-04-18 NOTE — Telephone Encounter (Signed)
Adjust insulin to keep blood sugar < 200

## 2023-04-19 DIAGNOSIS — Z794 Long term (current) use of insulin: Secondary | ICD-10-CM | POA: Diagnosis not present

## 2023-04-19 DIAGNOSIS — N183 Chronic kidney disease, stage 3 unspecified: Secondary | ICD-10-CM | POA: Diagnosis not present

## 2023-04-19 DIAGNOSIS — C61 Malignant neoplasm of prostate: Secondary | ICD-10-CM | POA: Diagnosis not present

## 2023-04-19 DIAGNOSIS — C7951 Secondary malignant neoplasm of bone: Secondary | ICD-10-CM | POA: Diagnosis not present

## 2023-04-19 DIAGNOSIS — I5042 Chronic combined systolic (congestive) and diastolic (congestive) heart failure: Secondary | ICD-10-CM | POA: Diagnosis not present

## 2023-04-19 DIAGNOSIS — E1169 Type 2 diabetes mellitus with other specified complication: Secondary | ICD-10-CM | POA: Diagnosis not present

## 2023-04-19 NOTE — Telephone Encounter (Signed)
24 units in the morning, also takes jardiance 10 mg.   Takes dexamethasone 4 mg dailey  Please call [phone] French Ana - RN with [facility]

## 2023-04-19 NOTE — Telephone Encounter (Signed)
Nicholas Burton has been informed of insulin instructions below: Increase the insulin 2 units each day to lower the blood sugar    Per Nicholas Burton pt is requesting to go back to taking Lantus at night.  Pt is also wanting clarification on why he is still taking Dexamethasone.   Please advise.

## 2023-04-19 NOTE — Telephone Encounter (Signed)
Increase the insulin 2 units each day to lower the blood sugar

## 2023-04-20 ENCOUNTER — Other Ambulatory Visit: Payer: Self-pay | Admitting: Internal Medicine

## 2023-04-20 DIAGNOSIS — E119 Type 2 diabetes mellitus without complications: Secondary | ICD-10-CM

## 2023-04-20 MED ORDER — LANTUS SOLOSTAR 100 UNIT/ML ~~LOC~~ SOPN: 50.0000 [IU] | PEN_INJECTOR | Freq: Every day | SUBCUTANEOUS | 0 refills | Status: DC

## 2023-04-20 NOTE — Telephone Encounter (Signed)
French Ana from [facility] called for clarification about the insulin increase. She would like a call back at [phone].

## 2023-04-20 NOTE — Telephone Encounter (Signed)
It is for the prostate cancer

## 2023-04-22 DIAGNOSIS — N400 Enlarged prostate without lower urinary tract symptoms: Secondary | ICD-10-CM | POA: Diagnosis not present

## 2023-04-22 DIAGNOSIS — C61 Malignant neoplasm of prostate: Secondary | ICD-10-CM | POA: Diagnosis not present

## 2023-04-22 DIAGNOSIS — Z9181 History of falling: Secondary | ICD-10-CM | POA: Diagnosis not present

## 2023-04-22 DIAGNOSIS — E1169 Type 2 diabetes mellitus with other specified complication: Secondary | ICD-10-CM | POA: Diagnosis not present

## 2023-04-22 DIAGNOSIS — N183 Chronic kidney disease, stage 3 unspecified: Secondary | ICD-10-CM | POA: Diagnosis not present

## 2023-04-22 DIAGNOSIS — C7951 Secondary malignant neoplasm of bone: Secondary | ICD-10-CM | POA: Diagnosis not present

## 2023-04-22 DIAGNOSIS — I5042 Chronic combined systolic (congestive) and diastolic (congestive) heart failure: Secondary | ICD-10-CM | POA: Diagnosis not present

## 2023-04-22 DIAGNOSIS — Z794 Long term (current) use of insulin: Secondary | ICD-10-CM | POA: Diagnosis not present

## 2023-04-24 DIAGNOSIS — E1169 Type 2 diabetes mellitus with other specified complication: Secondary | ICD-10-CM | POA: Diagnosis not present

## 2023-04-24 DIAGNOSIS — I5042 Chronic combined systolic (congestive) and diastolic (congestive) heart failure: Secondary | ICD-10-CM | POA: Diagnosis not present

## 2023-04-24 DIAGNOSIS — C61 Malignant neoplasm of prostate: Secondary | ICD-10-CM | POA: Diagnosis not present

## 2023-04-24 DIAGNOSIS — N183 Chronic kidney disease, stage 3 unspecified: Secondary | ICD-10-CM | POA: Diagnosis not present

## 2023-04-24 DIAGNOSIS — C7951 Secondary malignant neoplasm of bone: Secondary | ICD-10-CM | POA: Diagnosis not present

## 2023-04-24 DIAGNOSIS — Z794 Long term (current) use of insulin: Secondary | ICD-10-CM | POA: Diagnosis not present

## 2023-04-26 ENCOUNTER — Inpatient Hospital Stay: Payer: Medicare Other

## 2023-04-26 ENCOUNTER — Other Ambulatory Visit: Payer: Self-pay | Admitting: Internal Medicine

## 2023-04-26 ENCOUNTER — Inpatient Hospital Stay: Payer: Medicare Other | Admitting: Hematology

## 2023-04-26 DIAGNOSIS — E119 Type 2 diabetes mellitus without complications: Secondary | ICD-10-CM

## 2023-04-27 ENCOUNTER — Telehealth: Payer: Self-pay | Admitting: Internal Medicine

## 2023-04-27 DIAGNOSIS — I5042 Chronic combined systolic (congestive) and diastolic (congestive) heart failure: Secondary | ICD-10-CM | POA: Diagnosis not present

## 2023-04-27 DIAGNOSIS — C61 Malignant neoplasm of prostate: Secondary | ICD-10-CM | POA: Diagnosis not present

## 2023-04-27 DIAGNOSIS — Z794 Long term (current) use of insulin: Secondary | ICD-10-CM | POA: Diagnosis not present

## 2023-04-27 DIAGNOSIS — E1169 Type 2 diabetes mellitus with other specified complication: Secondary | ICD-10-CM | POA: Diagnosis not present

## 2023-04-27 DIAGNOSIS — N183 Chronic kidney disease, stage 3 unspecified: Secondary | ICD-10-CM | POA: Diagnosis not present

## 2023-04-27 DIAGNOSIS — C7951 Secondary malignant neoplasm of bone: Secondary | ICD-10-CM | POA: Diagnosis not present

## 2023-04-27 NOTE — Telephone Encounter (Signed)
Nicholas Burton has been informed of Dr. Lawerance Bach recommendations and expressed understanding. Nicholas Burton stated that she would follow up with [name] and his wife with the Rx update.

## 2023-04-27 NOTE — Telephone Encounter (Signed)
French Ana with [facility] - [phone] Called  On 04/26/2023 - patients had the following low blood pressure readings:  77/50, 79/50, 101/54  Patient was seen today: 102/58 - patient is taking metoprolol twice a day and losartan once a day in the evening - should patient continue this regimen?  Please advise.

## 2023-04-27 NOTE — Telephone Encounter (Signed)
Stop losartan - monitor BP   Follow up if BP remains low or other concerning symptoms

## 2023-04-30 DIAGNOSIS — G301 Alzheimer's disease with late onset: Secondary | ICD-10-CM | POA: Diagnosis not present

## 2023-05-01 ENCOUNTER — Other Ambulatory Visit: Payer: Self-pay | Admitting: Hematology

## 2023-05-01 DIAGNOSIS — C61 Malignant neoplasm of prostate: Secondary | ICD-10-CM

## 2023-05-02 ENCOUNTER — Encounter: Payer: Self-pay | Admitting: Hematology

## 2023-05-02 ENCOUNTER — Telehealth: Payer: Self-pay | Admitting: Internal Medicine

## 2023-05-02 DIAGNOSIS — I5042 Chronic combined systolic (congestive) and diastolic (congestive) heart failure: Secondary | ICD-10-CM | POA: Diagnosis not present

## 2023-05-02 DIAGNOSIS — C7951 Secondary malignant neoplasm of bone: Secondary | ICD-10-CM | POA: Diagnosis not present

## 2023-05-02 DIAGNOSIS — E1169 Type 2 diabetes mellitus with other specified complication: Secondary | ICD-10-CM | POA: Diagnosis not present

## 2023-05-02 DIAGNOSIS — Z794 Long term (current) use of insulin: Secondary | ICD-10-CM | POA: Diagnosis not present

## 2023-05-02 DIAGNOSIS — C61 Malignant neoplasm of prostate: Secondary | ICD-10-CM | POA: Diagnosis not present

## 2023-05-02 DIAGNOSIS — N183 Chronic kidney disease, stage 3 unspecified: Secondary | ICD-10-CM | POA: Diagnosis not present

## 2023-05-02 NOTE — Telephone Encounter (Signed)
Does he have signs of heart failure?

## 2023-05-02 NOTE — Telephone Encounter (Signed)
[facility] called to report patients' weight - Last week on the 29th it was 69 - today it is 174.2  Phone:  [phone]

## 2023-05-03 DIAGNOSIS — I5042 Chronic combined systolic (congestive) and diastolic (congestive) heart failure: Secondary | ICD-10-CM | POA: Diagnosis not present

## 2023-05-03 DIAGNOSIS — C7951 Secondary malignant neoplasm of bone: Secondary | ICD-10-CM | POA: Diagnosis not present

## 2023-05-03 DIAGNOSIS — E1169 Type 2 diabetes mellitus with other specified complication: Secondary | ICD-10-CM | POA: Diagnosis not present

## 2023-05-03 DIAGNOSIS — C61 Malignant neoplasm of prostate: Secondary | ICD-10-CM | POA: Diagnosis not present

## 2023-05-03 DIAGNOSIS — N183 Chronic kidney disease, stage 3 unspecified: Secondary | ICD-10-CM | POA: Diagnosis not present

## 2023-05-03 DIAGNOSIS — Z794 Long term (current) use of insulin: Secondary | ICD-10-CM | POA: Diagnosis not present

## 2023-05-03 NOTE — Telephone Encounter (Signed)
Called pt, LVM to discuss and get name and CB# for [facility] rep.

## 2023-05-08 DIAGNOSIS — Z794 Long term (current) use of insulin: Secondary | ICD-10-CM | POA: Diagnosis not present

## 2023-05-08 DIAGNOSIS — N183 Chronic kidney disease, stage 3 unspecified: Secondary | ICD-10-CM | POA: Diagnosis not present

## 2023-05-08 DIAGNOSIS — C7951 Secondary malignant neoplasm of bone: Secondary | ICD-10-CM | POA: Diagnosis not present

## 2023-05-08 DIAGNOSIS — C61 Malignant neoplasm of prostate: Secondary | ICD-10-CM | POA: Diagnosis not present

## 2023-05-08 DIAGNOSIS — I5042 Chronic combined systolic (congestive) and diastolic (congestive) heart failure: Secondary | ICD-10-CM | POA: Diagnosis not present

## 2023-05-08 DIAGNOSIS — E1169 Type 2 diabetes mellitus with other specified complication: Secondary | ICD-10-CM | POA: Diagnosis not present

## 2023-05-11 DIAGNOSIS — I5042 Chronic combined systolic (congestive) and diastolic (congestive) heart failure: Secondary | ICD-10-CM | POA: Diagnosis not present

## 2023-05-11 DIAGNOSIS — Z794 Long term (current) use of insulin: Secondary | ICD-10-CM | POA: Diagnosis not present

## 2023-05-11 DIAGNOSIS — N183 Chronic kidney disease, stage 3 unspecified: Secondary | ICD-10-CM | POA: Diagnosis not present

## 2023-05-11 DIAGNOSIS — E1169 Type 2 diabetes mellitus with other specified complication: Secondary | ICD-10-CM | POA: Diagnosis not present

## 2023-05-11 DIAGNOSIS — C61 Malignant neoplasm of prostate: Secondary | ICD-10-CM | POA: Diagnosis not present

## 2023-05-11 DIAGNOSIS — C7951 Secondary malignant neoplasm of bone: Secondary | ICD-10-CM | POA: Diagnosis not present

## 2023-05-15 DIAGNOSIS — R338 Other retention of urine: Secondary | ICD-10-CM | POA: Diagnosis not present

## 2023-05-17 DIAGNOSIS — C61 Malignant neoplasm of prostate: Secondary | ICD-10-CM | POA: Diagnosis not present

## 2023-05-17 DIAGNOSIS — E1169 Type 2 diabetes mellitus with other specified complication: Secondary | ICD-10-CM | POA: Diagnosis not present

## 2023-05-17 DIAGNOSIS — Z794 Long term (current) use of insulin: Secondary | ICD-10-CM | POA: Diagnosis not present

## 2023-05-17 DIAGNOSIS — Z9181 History of falling: Secondary | ICD-10-CM | POA: Diagnosis not present

## 2023-05-17 DIAGNOSIS — N183 Chronic kidney disease, stage 3 unspecified: Secondary | ICD-10-CM | POA: Diagnosis not present

## 2023-05-17 DIAGNOSIS — C7951 Secondary malignant neoplasm of bone: Secondary | ICD-10-CM | POA: Diagnosis not present

## 2023-05-17 DIAGNOSIS — I5042 Chronic combined systolic (congestive) and diastolic (congestive) heart failure: Secondary | ICD-10-CM | POA: Diagnosis not present

## 2023-05-17 DIAGNOSIS — N400 Enlarged prostate without lower urinary tract symptoms: Secondary | ICD-10-CM | POA: Diagnosis not present

## 2023-05-18 DIAGNOSIS — N183 Chronic kidney disease, stage 3 unspecified: Secondary | ICD-10-CM | POA: Diagnosis not present

## 2023-05-18 DIAGNOSIS — E1169 Type 2 diabetes mellitus with other specified complication: Secondary | ICD-10-CM | POA: Diagnosis not present

## 2023-05-18 DIAGNOSIS — C7951 Secondary malignant neoplasm of bone: Secondary | ICD-10-CM | POA: Diagnosis not present

## 2023-05-18 DIAGNOSIS — Z794 Long term (current) use of insulin: Secondary | ICD-10-CM | POA: Diagnosis not present

## 2023-05-18 DIAGNOSIS — I5042 Chronic combined systolic (congestive) and diastolic (congestive) heart failure: Secondary | ICD-10-CM | POA: Diagnosis not present

## 2023-05-18 DIAGNOSIS — C61 Malignant neoplasm of prostate: Secondary | ICD-10-CM | POA: Diagnosis not present

## 2023-05-18 NOTE — Telephone Encounter (Signed)
Tracey from  [employer] [employer] called wanting forms filled out from the [place]. She also mention about getting a referral sent to endocrinologist.   Best call back [phone]

## 2023-05-22 DIAGNOSIS — Z794 Long term (current) use of insulin: Secondary | ICD-10-CM | POA: Diagnosis not present

## 2023-05-22 DIAGNOSIS — E876 Hypokalemia: Secondary | ICD-10-CM | POA: Diagnosis not present

## 2023-05-22 DIAGNOSIS — Z7984 Long term (current) use of oral hypoglycemic drugs: Secondary | ICD-10-CM | POA: Diagnosis not present

## 2023-05-22 DIAGNOSIS — C61 Malignant neoplasm of prostate: Secondary | ICD-10-CM | POA: Diagnosis not present

## 2023-05-22 DIAGNOSIS — Z9181 History of falling: Secondary | ICD-10-CM | POA: Diagnosis not present

## 2023-05-22 DIAGNOSIS — N183 Chronic kidney disease, stage 3 unspecified: Secondary | ICD-10-CM | POA: Diagnosis not present

## 2023-05-22 DIAGNOSIS — I5042 Chronic combined systolic (congestive) and diastolic (congestive) heart failure: Secondary | ICD-10-CM | POA: Diagnosis not present

## 2023-05-22 DIAGNOSIS — N400 Enlarged prostate without lower urinary tract symptoms: Secondary | ICD-10-CM | POA: Diagnosis not present

## 2023-05-22 DIAGNOSIS — C7951 Secondary malignant neoplasm of bone: Secondary | ICD-10-CM | POA: Diagnosis not present

## 2023-05-22 DIAGNOSIS — E1122 Type 2 diabetes mellitus with diabetic chronic kidney disease: Secondary | ICD-10-CM | POA: Diagnosis not present

## 2023-05-24 DIAGNOSIS — N183 Chronic kidney disease, stage 3 unspecified: Secondary | ICD-10-CM | POA: Diagnosis not present

## 2023-05-24 DIAGNOSIS — C7951 Secondary malignant neoplasm of bone: Secondary | ICD-10-CM | POA: Diagnosis not present

## 2023-05-24 DIAGNOSIS — N400 Enlarged prostate without lower urinary tract symptoms: Secondary | ICD-10-CM | POA: Diagnosis not present

## 2023-05-24 DIAGNOSIS — E1122 Type 2 diabetes mellitus with diabetic chronic kidney disease: Secondary | ICD-10-CM | POA: Diagnosis not present

## 2023-05-24 DIAGNOSIS — C61 Malignant neoplasm of prostate: Secondary | ICD-10-CM | POA: Diagnosis not present

## 2023-05-24 DIAGNOSIS — I5042 Chronic combined systolic (congestive) and diastolic (congestive) heart failure: Secondary | ICD-10-CM | POA: Diagnosis not present

## 2023-05-29 ENCOUNTER — Other Ambulatory Visit: Payer: Self-pay

## 2023-05-29 DIAGNOSIS — C61 Malignant neoplasm of prostate: Secondary | ICD-10-CM

## 2023-05-30 ENCOUNTER — Inpatient Hospital Stay: Payer: Medicare Other | Attending: Hematology

## 2023-05-30 ENCOUNTER — Inpatient Hospital Stay: Payer: Medicare Other

## 2023-05-30 ENCOUNTER — Inpatient Hospital Stay (HOSPITAL_BASED_OUTPATIENT_CLINIC_OR_DEPARTMENT_OTHER): Payer: Medicare Other | Admitting: Hematology

## 2023-05-30 VITALS — BP 124/69 | HR 69 | Temp 97.8°F | Resp 18 | Wt 174.5 lb

## 2023-05-30 DIAGNOSIS — C61 Malignant neoplasm of prostate: Secondary | ICD-10-CM

## 2023-05-30 DIAGNOSIS — Z7189 Other specified counseling: Secondary | ICD-10-CM

## 2023-05-30 DIAGNOSIS — C7951 Secondary malignant neoplasm of bone: Secondary | ICD-10-CM

## 2023-05-30 DIAGNOSIS — Z5111 Encounter for antineoplastic chemotherapy: Secondary | ICD-10-CM | POA: Insufficient documentation

## 2023-05-30 DIAGNOSIS — G301 Alzheimer's disease with late onset: Secondary | ICD-10-CM | POA: Diagnosis not present

## 2023-05-30 LAB — CBC WITH DIFFERENTIAL (CANCER CENTER ONLY)
Abs Immature Granulocytes: 0.06 10*3/uL (ref 0.00–0.07)
Basophils Absolute: 0 10*3/uL (ref 0.0–0.1)
Basophils Relative: 0 %
Eosinophils Absolute: 0.2 10*3/uL (ref 0.0–0.5)
Eosinophils Relative: 2 %
HCT: 39.2 % (ref 39.0–52.0)
Hemoglobin: 12.5 g/dL — ABNORMAL LOW (ref 13.0–17.0)
Immature Granulocytes: 1 %
Lymphocytes Relative: 14 %
Lymphs Abs: 1.7 10*3/uL (ref 0.7–4.0)
MCH: 29.1 pg (ref 26.0–34.0)
MCHC: 31.9 g/dL (ref 30.0–36.0)
MCV: 91.2 fL (ref 80.0–100.0)
Monocytes Absolute: 0.6 10*3/uL (ref 0.1–1.0)
Monocytes Relative: 5 %
Neutro Abs: 9.1 10*3/uL — ABNORMAL HIGH (ref 1.7–7.7)
Neutrophils Relative %: 78 %
Platelet Count: 207 10*3/uL (ref 150–400)
RBC: 4.3 MIL/uL (ref 4.22–5.81)
RDW: 15.5 % (ref 11.5–15.5)
WBC Count: 11.7 10*3/uL — ABNORMAL HIGH (ref 4.0–10.5)
nRBC: 0 % (ref 0.0–0.2)

## 2023-05-30 LAB — CMP (CANCER CENTER ONLY)
ALT: 11 U/L (ref 0–44)
AST: 15 U/L (ref 15–41)
Albumin: 3.3 g/dL — ABNORMAL LOW (ref 3.5–5.0)
Alkaline Phosphatase: 105 U/L (ref 38–126)
Anion gap: 6 (ref 5–15)
BUN: 28 mg/dL — ABNORMAL HIGH (ref 8–23)
CO2: 26 mmol/L (ref 22–32)
Calcium: 8.6 mg/dL — ABNORMAL LOW (ref 8.9–10.3)
Chloride: 109 mmol/L (ref 98–111)
Creatinine: 1.35 mg/dL — ABNORMAL HIGH (ref 0.61–1.24)
GFR, Estimated: 49 mL/min — ABNORMAL LOW (ref >60)
Glucose, Bld: 124 mg/dL — ABNORMAL HIGH (ref 70–99)
Potassium: 4.1 mmol/L (ref 3.5–5.1)
Sodium: 141 mmol/L (ref 135–145)
Total Bilirubin: 0.5 mg/dL (ref 0.3–1.2)
Total Protein: 5.6 g/dL — ABNORMAL LOW (ref 6.5–8.1)

## 2023-05-30 MED ORDER — LEUPROLIDE ACETATE (3 MONTH) 22.5 MG ~~LOC~~ KIT
22.5000 mg | PACK | Freq: Once | SUBCUTANEOUS | Status: AC
  Administered 2023-05-30: 22.5 mg via SUBCUTANEOUS
  Filled 2023-05-30: qty 22.5

## 2023-05-30 NOTE — Progress Notes (Signed)
HEMATOLOGY/ONCOLOGY CLINIC NOTE  Date of Service: 05/30/2023  Patient Care Team: Etta Grandchild, MD as PCP - General Eldridge Dace Donnie Coffin, MD as PCP - Cardiology (Cardiology) Szabat, Vinnie Level, [facility] (Inactive) as Pharmacist (Pharmacist) Stephannie Li, MD as Consulting Physician (Ophthalmology) Johney Maine, MD as Consulting Physician (Hematology)  CHIEF COMPLAINTS/PURPOSE OF CONSULTATION:  F/u for continued management of poorly differentiated prostate cancer   HISTORY OF PRESENTING ILLNESS:  Nicholas Burton is a wonderful 89 y.o. male who has been referred to [place] by Dr Syliva Overman MD for evaluation and management of metastatic poorly differentiated prostate cancer with oligometastatic disease to the L3.   He has a history of hypertension, chronic diastolic heart failure, dyslipidemia, diabetes type 2, dementia and was admitted to [facility] with acute encephalopathy 10/05/2022.   Patient had a CT of the head which showed no acute intracranial findings CT of the lumbar spine was done which showed a lytic destructive pattern involving L2-L3 and L4 vertebral bodies concerning for possible discitis versus osteomyelitis versus tumor.   MRI lumbar spine on 10/05/2022 showed Findings concerning for osteomyelitis discitis at the L3-4 level. Abnormal enhancing material within the ventral epidural space at the level of L3, concerning for epidural phlegmon. These changes superimposed on underlying spondylosis and facet arthrosis resultant severe spinal stenosis. While these changes are favored to reflect infection, a possible neoplastic process involving the L3 vertebral body remains difficult to exclude. [place] interval follow-up imaging may be helpful for further evaluation as warranted.  This was treated with empiric antibiotics and is being followed by infectious disease. Patient subsequently underwent a biopsy of L3 10/07/2022 which showed metastatic poorly differentiated  prostate cancer.   Patient subsequently had a CT of the chest abdomen pelvis without contrast at our recommendation which was done on 10/11/2022 and showed no evidence of metastatic disease in the chest abdomen or pelvis.  Diffuse bladder wall thickening with surrounding inflammatory stranding worrisome for cystitis.  Stable abnormal appearance of L3 and nonspecific presacral edema.  INTERVAL HISTORY  Nicholas Burton is a wonderful 89 y.o. male who is here for continued evaluation and management of metastatic poorly differentiated prostate cancer with oligometastatic disease to the L3.   Patient was last seen by me on 01/25/2023 and he was doing well overall. Patient's son did complain of occasional hallucination issues.   Patient is accompanied by his son during this visit. He notes he has been doing well overall since our last visit. Patient notes he has hard time hearing during this visit since his hearing aid is not working properly. He also complains of ringing in his ear, which might be due to hearing aid issue.   He does complain of lower back pain. He denies any new infection issues, fever, chills, night sweats, unexpected weight loss, chest pain, back pain, or leg swelling.   Patient notes he has been eating well and has gained 7 lbs since our last visit.   Patient has been regularly taking Xtandi 80mg /d  with no toxicity issues. He denies any nausea, vomiting, diarrhea, fevers, chills, blood in urine, changes in bowel habits.  Patient notes he has a at-home caretaker.   He regularly follows-up with his Urologist.   Patient notes he uses walker at home to walk around the house.    MEDICAL HISTORY:  Past Medical History:  Diagnosis Date   Allergy    ANEMIA ASSOCIATED W/OTHER [facility] NUTRITIONAL DEFIC 08/11/2010   Qualifier: Diagnosis of  ByYetta Barre MD, Bernadene Bell.    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    History of skin cancer    Hyperlipidemia with target LDL less than 100 11/10/2008        Hypertension    Memory loss 05/14/2009   Obesity (BMI 30.0-34.9) 04/01/2013   Osteoarthritis    PSA elevation 10/25/2018   SKIN CANCER, HX OF 11/10/2008   Qualifier: Diagnosis of  By: Yetta Barre MD, Bernadene Bell.    Snoring 04/03/2014   Type II diabetes mellitus with manifestations (HCC) 11/10/2008   Estimated Creatinine Clearance: 38 mL/min (A) (by C-G formula based on SCr of 1.52 mg/dL (H)).    Venous stasis dermatitis of both lower extremities 01/29/2018   Vitamin D deficiency 11/10/2008    SURGICAL HISTORY: Past Surgical History:  Procedure Laterality Date   CHOLECYSTECTOMY     INGUINAL HERNIA REPAIR     x 3   IR FLUORO GUIDED NEEDLE PLC ASPIRATION/INJECTION LOC  10/07/2022   JOINT REPLACEMENT     KNEE SURGERY     x 2   MELANOMA EXCISION     x 3 -- Left arm   TOTAL KNEE ARTHROPLASTY     x 3    SOCIAL HISTORY: Social History   Socioeconomic History   Marital status: Married    Spouse name: Not on file   Number of children: 4   Years of education: Not on file   Highest education level: Not on file  Occupational History   Occupation: retired    Associate Professor: RETIRED  Tobacco Use   Smoking status: Never   Smokeless tobacco: Never  Vaping Use   Vaping status: Never Used  Substance and Sexual Activity   Alcohol use: No   Drug use: No   Sexual activity: Yes    Birth control/protection: None  Other Topics Concern   Not on file  Social History Narrative   No regular exercise   Social Determinants of Health   Financial Resource Strain: Low Risk  (07/11/2022)   Overall Financial Resource Strain (CARDIA)    Difficulty of Paying  Living Expenses: Not hard at all  Food Insecurity: No Food Insecurity (02/01/2023)   Hunger Vital Sign    Worried About Running Out of Food in the Last Year: Never true    Ran Out of Food in the Last Year: Never true  Transportation Needs: No Transportation Needs (02/01/2023)   PRAPARE - Administrator, Civil Service (Medical): No    Lack of Transportation (Non-Medical): No  Physical Activity: Inactive (07/09/2021)   Exercise Vital Sign    Days of Exercise per Week: 0 days    Minutes of Exercise per Session: 0 min  Stress: No Stress Concern Present (07/11/2022)   [employer] of Occupational Health - Occupational Stress Questionnaire    Feeling of Stress : Not at all  Social Connections: Moderately Isolated (07/11/2022)   Social Connection and Isolation Panel [NHANES]    Frequency of Communication with Friends and Family: Twice a week    Frequency of Social Gatherings with Friends and  Family: Twice a week    Attends Religious Services: Never    Active Member of Clubs or Organizations: No    Attends Banker Meetings: Never    Marital Status: Married  Catering manager Violence: Not At Risk (10/27/2022)   Humiliation, Afraid, Rape, and Kick questionnaire    Fear of Current or Ex-Partner: No    Emotionally Abused: No    Physically Abused: No    Sexually Abused: No    FAMILY HISTORY: Family History  Problem Relation Age of Onset   Heart disease Mother    Diabetes Mother    Heart disease Father    Cancer Sister    Cancer Brother    Diabetes Son    Diabetes Other     ALLERGIES:  is allergic to ace inhibitors, oxycodone, and penicillins.  MEDICATIONS:  Current Outpatient Medications  Medication Sig Dispense Refill   acetaminophen (TYLENOL) 325 MG tablet Take 1-2 tablets (325-650 mg total) by mouth every 6 (six) hours as needed for mild pain.     atorvastatin (LIPITOR) 10 MG tablet Take 1 tablet (10 mg total) by mouth daily. Pt needs to keep upcoming  appt in Oct for further refills 90 tablet 1   cholecalciferol (VITAMIN D) 1000 units tablet Take 1 tablet (1,000 Units total) by mouth daily. 30 tablet 0   Colesevelam HCl 3.75 g PACK Take 1 packet by mouth daily. 60 packet 1   cyanocobalamin (VITAMIN B12) 500 MCG tablet Take 1 tablet (500 mcg total) by mouth daily. 30 tablet 0   dexamethasone (DECADRON) 4 MG tablet Take 1 tablet (4 mg total) by mouth daily. 30 tablet 3   HYDROcodone-acetaminophen (NORCO/VICODIN) 5-325 MG tablet Take 1-2 tablets by mouth every 4 (four) hours as needed for moderate pain. 30 tablet 0   Insulin Pen Needle (NOVOFINE) 30G X 8 MM MISC Inject 10 each into the skin as needed. 100 each 1   JARDIANCE 10 MG TABS tablet TAKE 1 TABLET DAILY BEFORE BREAKFAST 90 tablet 3   LANTUS SOLOSTAR 100 UNIT/ML Solostar Pen Inject 50 Units into the skin daily. 45 mL 0   lidocaine (LIDODERM) 5 % Place 1 patch onto the skin daily. Remove & Discard patch within 12 hours or as directed by MD 30 patch 0   losartan (COZAAR) 25 MG tablet Take 1 tablet (25 mg total) by mouth daily. Pt needs to keep upcoming appt in Oct for further refills 90 tablet 1   magnesium gluconate (MAGONATE) 500 MG tablet Take 1 tablet (500 mg total) by mouth at bedtime. 30 tablet 0   melatonin 3 MG TABS tablet Take 1 tablet (3 mg total) by mouth at bedtime as needed. 30 tablet 0   metoprolol tartrate (LOPRESSOR) 25 MG tablet TAKE ONE-HALF (1/2) TABLET TWICE A DAY 90 tablet 1   Multiple Vitamins-Minerals (PRESERVISION/LUTEIN) CAPS Take 1 capsule by mouth 2 (two) times daily.     potassium chloride SA (KLOR-CON M) 20 MEQ tablet Take 1 tablet (20 mEq total) by mouth 2 (two) times daily. 4 tablet 0   tamsulosin (FLOMAX) 0.4 MG CAPS capsule Take 1 capsule (0.4 mg total) by mouth daily. 90 capsule 1   torsemide (DEMADEX) 10 MG tablet Take 1 tablet (10 mg total) by mouth every other day. 45 tablet 2   Vibegron (GEMTESA) 75 MG TABS TAKE 1 TABLET DAILY (Patient taking differently:  Take 75 mg by mouth daily.) 90 tablet 1   Vitamin D, Ergocalciferol, (DRISDOL) 1.25 MG (50000  UNIT) CAPS capsule Take 1 capsule (50,000 Units total) by mouth every 7 (seven) days. 5 capsule 0   XTANDI 80 MG tablet TAKE 1 TABLET DAILY 30 tablet 1   No current facility-administered medications for this visit.    REVIEW OF SYSTEMS:    10 Point review of Systems was done is negative except as noted above.   PHYSICAL EXAMINATION: ECOG PERFORMANCE STATUS: 2 - Symptomatic, <50% confined to bed  . Vitals:   05/30/23 1428  BP: 124/69  Pulse: 69  Resp: 18  Temp: 97.8 F (36.6 C)  SpO2: 99%   Filed Weights   05/30/23 1428  Weight: 174 lb 8 oz (79.2 kg)   .Body mass index is 25.77 kg/m. GENERAL:alert, in no acute distress and comfortable SKIN: no acute rashes, no significant lesions EYES: conjunctiva are pink and non-injected, sclera anicteric OROPHARYNX: MMM, no exudates, no oropharyngeal erythema or ulceration NECK: supple, no JVD LYMPH:  no palpable lymphadenopathy in the cervical, axillary or inguinal regions LUNGS: clear to auscultation b/l with normal respiratory effort HEART: regular rate & rhythm ABDOMEN:  normoactive bowel sounds , non tender, not distended. Extremity: no pedal edema PSYCH: alert & oriented x 3 with fluent speech NEURO: no focal motor/sensory deficits   LABORATORY DATA:  I have reviewed the data as listed .    Latest Ref Rng & Units 05/30/2023    2:06 PM 03/10/2023   11:53 AM 01/26/2023    7:58 PM  CBC  WBC 4.0 - 10.5 K/uL 11.7  11.2  10.2   Hemoglobin 13.0 - 17.0 g/dL 21.3  08.6  57.8   Hematocrit 39.0 - 52.0 % 39.2  38.5  40.2   Platelets 150 - 400 K/uL 207  182  213    .    Latest Ref Rng & Units 05/30/2023    2:06 PM 03/10/2023   11:53 AM 02/20/2023    2:52 PM  CMP  Glucose 70 - 99 mg/dL 469  629  528   BUN 8 - 23 mg/dL 28  25  19    Creatinine 0.61 - 1.24 mg/dL 4.13  2.44  0.10   Sodium 135 - 145 mmol/L 141  142  141   Potassium 3.5 -  5.1 mmol/L 4.1  4.0  3.6   Chloride 98 - 111 mmol/L 109  109  105   CO2 22 - 32 mmol/L 26  27  29    Calcium 8.9 - 10.3 mg/dL 8.6  9.0  8.7   Total Protein 6.5 - 8.1 g/dL 5.6  5.9    Total Bilirubin 0.3 - 1.2 mg/dL 0.5  0.4    Alkaline Phos 38 - 126 U/L 105  124    AST 15 - 41 U/L 15  16    ALT 0 - 44 U/L 11  14         RADIOGRAPHIC STUDIES: I have personally reviewed the radiological images as listed and agreed with the findings in the report. No results found.  ASSESSMENT & PLAN:   89 year old male with no previous history of malignancy.  Previous history of hypertension, dyslipidemia, diabetes, CHF dementia though alert oriented x 3 at baseline admitted with altered mental status which is now more or less cleared significant new back pain.   #1 Pathologic L3 compression due to bone metastases from metastatic poorly differentiated prostate cancer.  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 Cannot rule out additional infection.  No cultures were sent  from his L3 biopsy. PSA levels are elevated at 55.1 CT chest abdomen pelvis did not show any other sites of overt metastatic disease at this time or overt prostate lesions.   #2 history of dementia though apparently alert oriented x 3 at baseline with poor cognition.   #3 hypertension, dyslipidemia, diabetes, CHF.  PLAN: -Discussed lab results from today, 05/30/2023, in detail with the patient. CBC shows elevated WBC at 11.7 K and slightly decreased hemoglobin of 12.5 g/dL.  CMP is stable . PSA is down to 1.8. Recent PSA level was 2.8.  -Patient will not be getting bone -strengthening injection as of right now.   -patient regularly follows with a urologist monthly. Patient continues to have urinary retention. -answered all of patient's and his son's questions in detail -Patient treatment plan is: Xtand 80mg  po dailyi with Leuprolide shots every 3 months.   -discussed goal to  control prostate cancer for as long as possible with Lupron injection and Xtandi -patient's last Lupron injection was 4/12 and shall receive the next dose in 3 months -continue low-dose 80mg  Enzalutamide. -Answered all of patient's questions.   FOLLOW-UP: Plz schedule next 4 doses of Lupron every 3 months per orders RTC with Dr Candise Che with labs with next dose of lupron   The total time spent in the appointment was 30 minutes* .  All of the patient's questions were answered with apparent satisfaction. The patient knows to call the clinic with any problems, questions or concerns.   Wyvonnia Lora MD MS AAHIVMS [facility] Hematology/Oncology Physician [facility]  .*Total Encounter Time as defined by the Centers for Medicare and Medicaid Services includes, in addition to the face-to-face time of a patient visit (documented in the note above) non-face-to-face time: obtaining and reviewing outside history, ordering and reviewing medications, tests or procedures, care coordination (communications with other health care professionals or caregivers) and documentation in the medical record.   I,Param Shah,acting as a Neurosurgeon for Wyvonnia Lora, MD.,have documented all relevant documentation on the behalf of Wyvonnia Lora, MD,as directed by  Wyvonnia Lora, MD while in the presence of Wyvonnia Lora, MD.   .I have reviewed the above documentation for accuracy and completeness, and I agree with the above. Johney Maine MD

## 2023-05-30 NOTE — Patient Instructions (Signed)

## 2023-05-31 ENCOUNTER — Inpatient Hospital Stay: Payer: Medicare Other | Admitting: Hematology

## 2023-05-31 ENCOUNTER — Inpatient Hospital Stay: Payer: Medicare Other

## 2023-05-31 ENCOUNTER — Ambulatory Visit: Payer: Medicare Other | Admitting: Interventional Cardiology

## 2023-05-31 LAB — PROSTATE-SPECIFIC AG, SERUM (LABCORP): Prostate Specific Ag, Serum: 1.8 ng/mL (ref 0.0–4.0)

## 2023-06-01 DIAGNOSIS — I5042 Chronic combined systolic (congestive) and diastolic (congestive) heart failure: Secondary | ICD-10-CM | POA: Diagnosis not present

## 2023-06-01 DIAGNOSIS — C7951 Secondary malignant neoplasm of bone: Secondary | ICD-10-CM | POA: Diagnosis not present

## 2023-06-01 DIAGNOSIS — C61 Malignant neoplasm of prostate: Secondary | ICD-10-CM | POA: Diagnosis not present

## 2023-06-01 DIAGNOSIS — N183 Chronic kidney disease, stage 3 unspecified: Secondary | ICD-10-CM | POA: Diagnosis not present

## 2023-06-01 DIAGNOSIS — E1122 Type 2 diabetes mellitus with diabetic chronic kidney disease: Secondary | ICD-10-CM | POA: Diagnosis not present

## 2023-06-01 DIAGNOSIS — N400 Enlarged prostate without lower urinary tract symptoms: Secondary | ICD-10-CM | POA: Diagnosis not present

## 2023-06-05 ENCOUNTER — Other Ambulatory Visit: Payer: Self-pay | Admitting: Internal Medicine

## 2023-06-05 ENCOUNTER — Encounter: Payer: Self-pay | Admitting: Hematology

## 2023-06-05 DIAGNOSIS — N3281 Overactive bladder: Secondary | ICD-10-CM

## 2023-06-09 ENCOUNTER — Inpatient Hospital Stay: Payer: Medicare Other

## 2023-06-12 DIAGNOSIS — N400 Enlarged prostate without lower urinary tract symptoms: Secondary | ICD-10-CM | POA: Diagnosis not present

## 2023-06-12 DIAGNOSIS — C7951 Secondary malignant neoplasm of bone: Secondary | ICD-10-CM | POA: Diagnosis not present

## 2023-06-12 DIAGNOSIS — E1122 Type 2 diabetes mellitus with diabetic chronic kidney disease: Secondary | ICD-10-CM | POA: Diagnosis not present

## 2023-06-12 DIAGNOSIS — I5042 Chronic combined systolic (congestive) and diastolic (congestive) heart failure: Secondary | ICD-10-CM | POA: Diagnosis not present

## 2023-06-12 DIAGNOSIS — G301 Alzheimer's disease with late onset: Secondary | ICD-10-CM | POA: Diagnosis not present

## 2023-06-12 DIAGNOSIS — N183 Chronic kidney disease, stage 3 unspecified: Secondary | ICD-10-CM | POA: Diagnosis not present

## 2023-06-12 DIAGNOSIS — C61 Malignant neoplasm of prostate: Secondary | ICD-10-CM | POA: Diagnosis not present

## 2023-06-14 DIAGNOSIS — R338 Other retention of urine: Secondary | ICD-10-CM | POA: Diagnosis not present

## 2023-06-21 DIAGNOSIS — Z7984 Long term (current) use of oral hypoglycemic drugs: Secondary | ICD-10-CM | POA: Diagnosis not present

## 2023-06-21 DIAGNOSIS — Z794 Long term (current) use of insulin: Secondary | ICD-10-CM | POA: Diagnosis not present

## 2023-06-21 DIAGNOSIS — I5042 Chronic combined systolic (congestive) and diastolic (congestive) heart failure: Secondary | ICD-10-CM | POA: Diagnosis not present

## 2023-06-21 DIAGNOSIS — C61 Malignant neoplasm of prostate: Secondary | ICD-10-CM | POA: Diagnosis not present

## 2023-06-21 DIAGNOSIS — E876 Hypokalemia: Secondary | ICD-10-CM | POA: Diagnosis not present

## 2023-06-21 DIAGNOSIS — E1122 Type 2 diabetes mellitus with diabetic chronic kidney disease: Secondary | ICD-10-CM | POA: Diagnosis not present

## 2023-06-21 DIAGNOSIS — N183 Chronic kidney disease, stage 3 unspecified: Secondary | ICD-10-CM | POA: Diagnosis not present

## 2023-06-21 DIAGNOSIS — C7951 Secondary malignant neoplasm of bone: Secondary | ICD-10-CM | POA: Diagnosis not present

## 2023-06-21 DIAGNOSIS — Z9181 History of falling: Secondary | ICD-10-CM | POA: Diagnosis not present

## 2023-06-21 DIAGNOSIS — N400 Enlarged prostate without lower urinary tract symptoms: Secondary | ICD-10-CM | POA: Diagnosis not present

## 2023-06-22 ENCOUNTER — Ambulatory Visit: Payer: Medicare Other | Admitting: Internal Medicine

## 2023-06-22 ENCOUNTER — Encounter: Payer: Self-pay | Admitting: Internal Medicine

## 2023-06-22 VITALS — BP 114/66 | HR 75 | Temp 98.3°F | Resp 16 | Ht 69.0 in

## 2023-06-22 DIAGNOSIS — T148XXA Other injury of unspecified body region, initial encounter: Secondary | ICD-10-CM

## 2023-06-22 DIAGNOSIS — Z23 Encounter for immunization: Secondary | ICD-10-CM | POA: Diagnosis not present

## 2023-06-22 DIAGNOSIS — E119 Type 2 diabetes mellitus without complications: Secondary | ICD-10-CM

## 2023-06-22 DIAGNOSIS — L089 Local infection of the skin and subcutaneous tissue, unspecified: Secondary | ICD-10-CM | POA: Diagnosis not present

## 2023-06-22 DIAGNOSIS — N183 Chronic kidney disease, stage 3 unspecified: Secondary | ICD-10-CM

## 2023-06-22 DIAGNOSIS — Z794 Long term (current) use of insulin: Secondary | ICD-10-CM | POA: Diagnosis not present

## 2023-06-22 MED ORDER — CEFDINIR 300 MG PO CAPS: 300.0000 mg | ORAL_CAPSULE | Freq: Two times a day (BID) | ORAL | 0 refills | Status: AC

## 2023-06-22 MED ORDER — SULFAMETHOXAZOLE-TRIMETHOPRIM 800-160 MG PO TABS: 1.0000 | ORAL_TABLET | Freq: Two times a day (BID) | ORAL | 0 refills | Status: AC

## 2023-06-22 NOTE — Patient Instructions (Signed)
Wound Infection A wound infection happens when germs start to grow in a wound. Infection can cause the wound to break open or get worse. Wound infections need treatment. If a wound infection is not treated, serious problems can happen. These problems could include getting an infection in your blood (septicemia) or bones (osteomyelitis). What are the causes? A wound infection is most often caused by bacteria growing in a wound. Other germs, like yeast and fungi, can also cause wound infections. What increases the risk? The following factors may make you more likely to develop a wound infection: A weak body defense system (immune system). Diabetes. Taking steroid medicines for a long time (chronic use). Smoking. Being an older adult. Obesity. Taking chemotherapy medicines. Poor nutrition. What are the signs or symptoms? Symptoms of a wound infection include: More redness, swelling, or pain at the wound site. More blood or fluid at the wound site. A bad smell coming from a wound or bandage (dressing). Fever. Feeling tired (fatigued). Warmth at or around the wound. Pus at the wound site. How is this diagnosed? A wound infection is diagnosed based on your symptoms, medical history, and physical exam. You may also have a wound culture, blood tests, or both. How is this treated? This condition is usually treated with antibiotics and medicines that lower inflammation. The infection should get better in 24-48 hours after starting antibiotics. After 24-48 hours, redness around the wound should stop spreading. The wound should also be less painful. If the condition is severe, you may need to stay at the hospital and get antibiotics through an IV. Follow these instructions at home: Medicines Take or apply over-the-counter and prescription medicines only as told by your health care provider. If you were prescribed antibiotics, take or apply them as told by your provider. Do not stop using the  antibiotic even if you start to feel better. Wound care  Clean the wound each day or as told by your provider. Wash the wound with mild soap and water. Rinse the wound with water to remove all soap. Pat the wound dry with a clean towel. Do not rub it. Follow instructions from your provider about how to take care of your wound. Make sure you: Wash your hands with soap and water for at least 20 seconds before and after you change your dressing. If soap and water are not available, use hand sanitizer. Change your dressing as told by your provider. Leave stitches (sutures), skin glue, or tape strips in place. These skin closures may need to stay in place for 2 weeks or longer. If tape strip edges start to loosen and curl up, you may trim the loose edges. Do not remove tape strips completely unless your provider tells you to do that. Check your wound every day for signs of infection. Check for: More redness, swelling, or pain. More fluid or blood. Warmth. Pus or a bad smell. General instructions Drink enough fluid to keep your pee (urine) pale yellow. Do not take baths, swim, or use a hot tub until your provider approves. Ask your provider if you may take showers. You may only be allowed to take sponge baths. Raise (elevate) the wound area above the level of your heart while you are sitting or lying down. Do not scratch or pick at the wound. Keep all follow-up visits. These visits help your provider make sure a more serious infection is not developing. Contact a health care provider if: Your infection does not get better in 24-48 hours.  You have signs of infection. You have a fever. Your wound gets larger, turns dark in color, or becomes more painful. You feel generally sick (malaise) with muscle aches and weakness. Your symptoms get worse. You have vomiting or diarrhea that does not stop. Get help right away if: Your wound that was closed breaks open. You see red streaks coming from the  infected area. This information is not intended to replace advice given to you by your health care provider. Make sure you discuss any questions you have with your health care provider. Document Revised: 05/19/2022 Document Reviewed: 05/19/2022 Elsevier Patient Education  2024 [employer].

## 2023-06-22 NOTE — Progress Notes (Signed)
Subjective:  Patient ID: Nicholas Burton, male    DOB: 25-May-1928  Age: 89 y.o. MRN: [id number]  CC: Diabetes and Wound Infection   HPI [name] presents for f/up ----  Discussed the use of AI scribe software for clinical note transcription with the patient, who gave verbal consent to proceed.  History of Present Illness   The patient, with a history of hearing impairment, presents with a recent fall from bed approximately eleven days ago. He reports a resultant injury, suspected to be infected due to observed erythema.       Outpatient Medications Prior to Visit  Medication Sig Dispense Refill   acetaminophen (TYLENOL) 325 MG tablet Take 1-2 tablets (325-650 mg total) by mouth every 6 (six) hours as needed for mild pain.     atorvastatin (LIPITOR) 10 MG tablet Take 1 tablet (10 mg total) by mouth daily. Pt needs to keep upcoming appt in Oct for further refills 90 tablet 1   cholecalciferol (VITAMIN D) 1000 units tablet Take 1 tablet (1,000 Units total) by mouth daily. 30 tablet 0   Colesevelam HCl 3.75 g PACK Take 1 packet by mouth daily. 60 packet 1   cyanocobalamin (VITAMIN B12) 500 MCG tablet Take 1 tablet (500 mcg total) by mouth daily. 30 tablet 0   dexamethasone (DECADRON) 4 MG tablet Take 1 tablet (4 mg total) by mouth daily. 30 tablet 3   HYDROcodone-acetaminophen (NORCO/VICODIN) 5-325 MG tablet Take 1-2 tablets by mouth every 4 (four) hours as needed for moderate pain. 30 tablet 0   Insulin Pen Needle (NOVOFINE) 30G X 8 MM MISC Inject 10 each into the skin as needed. 100 each 1   JARDIANCE 10 MG TABS tablet TAKE 1 TABLET DAILY BEFORE BREAKFAST 90 tablet 3   lidocaine (LIDODERM) 5 % Place 1 patch onto the skin daily. Remove & Discard patch within 12 hours or as directed by MD 30 patch 0   losartan (COZAAR) 25 MG tablet Take 1 tablet (25 mg total) by mouth daily. Pt needs to keep upcoming appt in Oct for further refills 90 tablet 1   magnesium gluconate (MAGONATE) 500 MG tablet  Take 1 tablet (500 mg total) by mouth at bedtime. 30 tablet 0   melatonin 3 MG TABS tablet Take 1 tablet (3 mg total) by mouth at bedtime as needed. 30 tablet 0   metoprolol tartrate (LOPRESSOR) 25 MG tablet TAKE ONE-HALF (1/2) TABLET TWICE A DAY 90 tablet 1   Multiple Vitamins-Minerals (PRESERVISION/LUTEIN) CAPS Take 1 capsule by mouth 2 (two) times daily.     potassium chloride SA (KLOR-CON M) 20 MEQ tablet Take 1 tablet (20 mEq total) by mouth 2 (two) times daily. 4 tablet 0   tamsulosin (FLOMAX) 0.4 MG CAPS capsule Take 1 capsule (0.4 mg total) by mouth daily. 90 capsule 1   torsemide (DEMADEX) 10 MG tablet Take 1 tablet (10 mg total) by mouth every other day. 45 tablet 2   Vibegron (GEMTESA) 75 MG TABS TAKE 1 TABLET DAILY 90 tablet 0   Vitamin D, Ergocalciferol, (DRISDOL) 1.25 MG (50000 UNIT) CAPS capsule Take 1 capsule (50,000 Units total) by mouth every 7 (seven) days. 5 capsule 0   LANTUS SOLOSTAR 100 UNIT/ML Solostar Pen Inject 50 Units into the skin daily. 45 mL 0   XTANDI 80 MG tablet TAKE 1 TABLET DAILY 30 tablet 1   No facility-administered medications prior to visit.    ROS Review of Systems  Constitutional:  Positive for  unexpected weight change (wt gain). Negative for appetite change, chills, diaphoresis, fatigue and fever.  HENT:  Negative for sore throat and trouble swallowing.   Respiratory:  Positive for cough, choking and shortness of breath. Negative for chest tightness and wheezing.   Cardiovascular:  Positive for leg swelling. Negative for chest pain and palpitations.  Gastrointestinal:  Negative for abdominal pain, diarrhea, nausea and vomiting.  Genitourinary: Negative.   Musculoskeletal:  Positive for arthralgias, back pain and gait problem.  Skin:  Positive for wound.  Neurological:  Positive for weakness. Negative for dizziness.  Hematological:  Negative for adenopathy. Does not bruise/bleed easily.  Psychiatric/Behavioral:  Positive for behavioral problems,  confusion and decreased concentration.     Objective:  BP 114/66 (BP Location: Left Arm, Patient Position: Sitting, Cuff Size: Normal)   Pulse 75   Temp 98.3 F (36.8 C) (Oral)   Resp 16   Ht 5\' 9"  (1.753 m)   SpO2 95%   BMI 25.77 kg/m   BP Readings from Last 3 Encounters:  06/22/23 114/66  05/30/23 124/69  03/10/23 112/80    Wt Readings from Last 3 Encounters:  05/30/23 174 lb 8 oz (79.2 kg)  02/24/23 165 lb 9.6 oz (75.1 kg)  02/20/23 166 lb (75.3 kg)    Physical Exam Vitals reviewed.  HENT:     Right Ear: Decreased hearing noted. There is impacted cerumen.     Left Ear: Decreased hearing noted. There is no impacted cerumen.     Mouth/Throat:     Mouth: Mucous membranes are moist.  Eyes:     General: No scleral icterus.    Conjunctiva/sclera: Conjunctivae normal.  Cardiovascular:     Rate and Rhythm: Normal rate and regular rhythm.     Heart sounds: No murmur heard. Pulmonary:     Effort: Pulmonary effort is normal.     Breath sounds: Examination of the right-upper field reveals decreased breath sounds. Examination of the left-upper field reveals decreased breath sounds. Examination of the right-middle field reveals decreased breath sounds. Examination of the left-middle field reveals decreased breath sounds. Examination of the right-lower field reveals decreased breath sounds. Examination of the left-lower field reveals decreased breath sounds. Decreased breath sounds present. No wheezing, rhonchi or rales.  Abdominal:     General: Abdomen is protuberant. There is no distension.     Palpations: There is no mass.     Tenderness: There is no abdominal tenderness. There is no guarding.     Hernia: No hernia is present.  Musculoskeletal:     Cervical back: Neck supple.     Right lower leg: 2+ Edema present.     Left lower leg: 2+ Edema present.  Lymphadenopathy:     Cervical: No cervical adenopathy.  Skin:    Findings: Erythema present.     Comments: Over the left  knee there is a scabbed wound.  This is surrounded by erythema and warmth.  There is no induration, fluctuance, or exudates.  Neurological:     Mental Status: He is alert.  Psychiatric:        Attention and Perception: He is inattentive.        Mood and Affect: Affect is blunt.        Speech: Speech is tangential.        Behavior: Behavior is uncooperative and agitated.        Cognition and Memory: Cognition is impaired. Memory is impaired.     Lab Results  Component Value Date  WBC 11.7 (H) 05/30/2023   HGB 12.5 (L) 05/30/2023   HCT 39.2 05/30/2023   PLT 207 05/30/2023   GLUCOSE 124 (H) 05/30/2023   CHOL 83 09/17/2019   TRIG 136.0 09/17/2019   HDL 36.40 (L) 09/17/2019   LDLCALC 19 09/17/2019   ALT 11 05/30/2023   AST 15 05/30/2023   NA 141 05/30/2023   K 4.1 05/30/2023   CL 109 05/30/2023   CREATININE 1.35 (H) 05/30/2023   BUN 28 (H) 05/30/2023   CO2 26 05/30/2023   TSH 3.821 10/06/2022   PSA 11.21 (H) 10/30/2017   INR 1.1 10/07/2022   HGBA1C 7.9 (H) 06/22/2023   MICROALBUR 0.7 09/17/2019    DG Chest Port 1 View  Result Date: 01/26/2023 CLINICAL DATA:  Altered mental status EXAM: PORTABLE CHEST 1 VIEW COMPARISON:  11/28/2022 FINDINGS: Small left pleural effusion or thickening as before. No consolidation. Stable cardiomediastinal silhouette. No pneumothorax IMPRESSION: No active disease. Small left pleural effusion or thickening. Electronically Signed   By: Jasmine Pang M.D.   On: 01/26/2023 20:51    Assessment & Plan:  Need for immunization against influenza -     Flu Vaccine Trivalent High Dose (Fluad)  Insulin-requiring or dependent type II diabetes mellitus (HCC)- His blood sugar is adequately well-controlled. -     Hemoglobin A1c; Future -     Lantus SoloStar; Inject 50 Units into the skin daily.  Dispense: 45 mL; Refill: 0  Wound infection, posttraumatic- Will treat for staph, strep, and other organisms with the combination of Bactrim and cefdinir. -      Sulfamethoxazole-Trimethoprim; Take 1 tablet by mouth 2 (two) times daily for 7 days.  Dispense: 14 tablet; Refill: 0 -     Cefdinir; Take 1 capsule (300 mg total) by mouth 2 (two) times daily for 7 days.  Dispense: 14 capsule; Refill: 0  CRI (chronic renal insufficiency), stage 3 (moderate) - Renal function is stable.     Follow-up: Return in about 1 week (around 06/29/2023).  Sanda Linger, MD

## 2023-06-23 ENCOUNTER — Other Ambulatory Visit: Payer: Self-pay | Admitting: Hematology

## 2023-06-23 DIAGNOSIS — C61 Malignant neoplasm of prostate: Secondary | ICD-10-CM

## 2023-06-23 LAB — HEMOGLOBIN A1C: Hgb A1c MFr Bld: 7.9 % — ABNORMAL HIGH (ref 4.6–6.5)

## 2023-06-23 MED ORDER — LANTUS SOLOSTAR 100 UNIT/ML ~~LOC~~ SOPN
50.0000 [IU] | PEN_INJECTOR | Freq: Every day | SUBCUTANEOUS | 0 refills | Status: DC
Start: 2023-06-23 — End: 2023-07-22

## 2023-06-28 ENCOUNTER — Telehealth: Payer: Self-pay | Admitting: Internal Medicine

## 2023-06-28 DIAGNOSIS — N400 Enlarged prostate without lower urinary tract symptoms: Secondary | ICD-10-CM | POA: Diagnosis not present

## 2023-06-28 DIAGNOSIS — I5042 Chronic combined systolic (congestive) and diastolic (congestive) heart failure: Secondary | ICD-10-CM | POA: Diagnosis not present

## 2023-06-28 DIAGNOSIS — E1122 Type 2 diabetes mellitus with diabetic chronic kidney disease: Secondary | ICD-10-CM | POA: Diagnosis not present

## 2023-06-28 DIAGNOSIS — C7951 Secondary malignant neoplasm of bone: Secondary | ICD-10-CM | POA: Diagnosis not present

## 2023-06-28 DIAGNOSIS — N183 Chronic kidney disease, stage 3 unspecified: Secondary | ICD-10-CM | POA: Diagnosis not present

## 2023-06-28 DIAGNOSIS — C61 Malignant neoplasm of prostate: Secondary | ICD-10-CM | POA: Diagnosis not present

## 2023-06-28 NOTE — Telephone Encounter (Signed)
FYI

## 2023-06-28 NOTE — Telephone Encounter (Signed)
Neka from [facility] [employer] called and said patient fell around 12 today. EMS was called to help him up, but did not take him to the hospital. There was no injury sustained in the fall. Best callback for Fredderick Phenix is [phone](secure).

## 2023-07-05 ENCOUNTER — Telehealth: Payer: Self-pay | Admitting: Internal Medicine

## 2023-07-05 ENCOUNTER — Other Ambulatory Visit: Payer: Self-pay

## 2023-07-05 DIAGNOSIS — N183 Chronic kidney disease, stage 3 unspecified: Secondary | ICD-10-CM

## 2023-07-05 DIAGNOSIS — E118 Type 2 diabetes mellitus with unspecified complications: Secondary | ICD-10-CM

## 2023-07-05 DIAGNOSIS — I5042 Chronic combined systolic (congestive) and diastolic (congestive) heart failure: Secondary | ICD-10-CM | POA: Diagnosis not present

## 2023-07-05 DIAGNOSIS — N400 Enlarged prostate without lower urinary tract symptoms: Secondary | ICD-10-CM | POA: Diagnosis not present

## 2023-07-05 DIAGNOSIS — E1122 Type 2 diabetes mellitus with diabetic chronic kidney disease: Secondary | ICD-10-CM | POA: Diagnosis not present

## 2023-07-05 DIAGNOSIS — C7951 Secondary malignant neoplasm of bone: Secondary | ICD-10-CM | POA: Diagnosis not present

## 2023-07-05 DIAGNOSIS — C61 Malignant neoplasm of prostate: Secondary | ICD-10-CM | POA: Diagnosis not present

## 2023-07-05 MED ORDER — EMPAGLIFLOZIN 10 MG PO TABS: 10.0000 mg | ORAL_TABLET | Freq: Every day | ORAL | 0 refills | Status: DC

## 2023-07-05 NOTE — Telephone Encounter (Signed)
Medication refill has been sent to Dr. Yetta Barre [facility].

## 2023-07-05 NOTE — Telephone Encounter (Signed)
Patient wants to know if you can send the Jardiance 10 mg. To [facility] - it was going to be around $500 at the other pharmacy.  Please call patient and let them know.

## 2023-07-06 DIAGNOSIS — G301 Alzheimer's disease with late onset: Secondary | ICD-10-CM | POA: Diagnosis not present

## 2023-07-10 ENCOUNTER — Telehealth: Payer: Self-pay | Admitting: Internal Medicine

## 2023-07-10 NOTE — Telephone Encounter (Signed)
Dee with [facility] called states she needs hospice orders for the patient. Also would like to know if Dr.[name] will serve as the attending and if he would like to do symptom management or let the hospice staff do it. Best callback number is [phone].

## 2023-07-11 ENCOUNTER — Other Ambulatory Visit: Payer: Self-pay | Admitting: Internal Medicine

## 2023-07-11 DIAGNOSIS — F0280 Dementia in other diseases classified elsewhere without behavioral disturbance: Secondary | ICD-10-CM

## 2023-07-11 DIAGNOSIS — C61 Malignant neoplasm of prostate: Secondary | ICD-10-CM

## 2023-07-11 NOTE — Telephone Encounter (Signed)
Please advise 

## 2023-07-12 ENCOUNTER — Telehealth: Payer: Self-pay | Admitting: Internal Medicine

## 2023-07-12 DIAGNOSIS — R338 Other retention of urine: Secondary | ICD-10-CM | POA: Diagnosis not present

## 2023-07-12 NOTE — Telephone Encounter (Signed)
Is this okay with you?   

## 2023-07-12 NOTE — Telephone Encounter (Signed)
[facility]osp[facility] wants to know if Dr. Yetta Barre will serve as the physician on record for Nicholas Burton.  This is at his families request.  They also need a statement that patient has 6 months or less to live to [facility].Please call [phone]  - Star

## 2023-07-12 NOTE — Telephone Encounter (Signed)
Star is aware-

## 2023-07-13 ENCOUNTER — Ambulatory Visit: Payer: Medicare Other

## 2023-07-13 VITALS — Ht 69.0 in | Wt 176.0 lb

## 2023-07-13 DIAGNOSIS — Z0000 Encounter for general adult medical examination without abnormal findings: Secondary | ICD-10-CM

## 2023-07-13 NOTE — Progress Notes (Signed)
Subjective:   Nicholas Burton is a 89 y.o. male who presents for Medicare Annual/Subsequent preventive examination.  Visit Complete: Virtual I connected with  Consepcion Hearing Fischl on 07/13/23 by a audio enabled telemedicine application and verified that I am speaking with the correct person using two identifiers.  Patient Location: Home  Provider Location: Office/Clinic  I discussed the limitations of evaluation and management by telemedicine. The patient expressed understanding and agreed to proceed.  Vital Signs: Because this visit was a virtual/telehealth visit, some criteria may be missing or patient reported. Any vitals not documented were not able to be obtained and vitals that have been documented are patient reported.   Cardiac Risk Factors include: advanced age (>66men, >36 women);hypertension;male gender;Other (see comment);dyslipidemia;diabetes mellitus, Risk factor comments: COPD, CHF, Alzheimer, BPH     Objective:    Today's Vitals   07/13/23 1453  Weight: 176 lb (79.8 kg)  Height: 5\' 9"  (1.753 m)   Body mass index is 25.99 kg/m.     07/13/2023    3:00 PM 01/26/2023    7:08 PM 10/14/2022   12:56 PM 10/05/2022    8:13 PM 07/11/2022    3:50 PM 07/09/2021    1:49 PM 03/18/2020    1:45 PM  Advanced Directives  Does Patient Have a Medical Advance Directive? Yes Unable to assess, patient is non-responsive or altered mental status No No No Yes Yes  Type of Estate agent of [place];Living will     Healthcare Power of [place];Living will Living will;Healthcare Power of Attorney  Does patient want to make changes to medical advance directive? No - Patient declined      No - Patient declined  Copy of Healthcare Power of Attorney in Chart? Yes - validated most recent copy scanned in chart (See row information)     No - copy requested No - copy requested  Would patient like information on creating a medical advance directive?   No - Patient declined  No - Patient  declined      Current Medications (verified) Outpatient Encounter Medications as of 07/13/2023  Medication Sig   acetaminophen (TYLENOL) 325 MG tablet Take 1-2 tablets (325-650 mg total) by mouth every 6 (six) hours as needed for mild pain.   atorvastatin (LIPITOR) 10 MG tablet Take 1 tablet (10 mg total) by mouth daily. Pt needs to keep upcoming appt in Oct for further refills   cholecalciferol (VITAMIN D) 1000 units tablet Take 1 tablet (1,000 Units total) by mouth daily.   Colesevelam HCl 3.75 g PACK Take 1 packet by mouth daily.   cyanocobalamin (VITAMIN B12) 500 MCG tablet Take 1 tablet (500 mcg total) by mouth daily.   dexamethasone (DECADRON) 4 MG tablet Take 1 tablet (4 mg total) by mouth daily.   empagliflozin (JARDIANCE) 10 MG TABS tablet Take 1 tablet (10 mg total) by mouth daily before breakfast.   HYDROcodone-acetaminophen (NORCO/VICODIN) 5-325 MG tablet Take 1-2 tablets by mouth every 4 (four) hours as needed for moderate pain.   Insulin Pen Needle (NOVOFINE) 30G X 8 MM MISC Inject 10 each into the skin as needed.   LANTUS SOLOSTAR 100 UNIT/ML Solostar Pen Inject 50 Units into the skin daily.   lidocaine (LIDODERM) 5 % Place 1 patch onto the skin daily. Remove & Discard patch within 12 hours or as directed by MD   losartan (COZAAR) 25 MG tablet Take 1 tablet (25 mg total) by mouth daily. Pt needs to keep upcoming appt in  Oct for further refills   magnesium gluconate (MAGONATE) 500 MG tablet Take 1 tablet (500 mg total) by mouth at bedtime.   melatonin 3 MG TABS tablet Take 1 tablet (3 mg total) by mouth at bedtime as needed.   metoprolol tartrate (LOPRESSOR) 25 MG tablet TAKE ONE-HALF (1/2) TABLET TWICE A DAY   Multiple Vitamins-Minerals (PRESERVISION/LUTEIN) CAPS Take 1 capsule by mouth 2 (two) times daily.   potassium chloride SA (KLOR-CON M) 20 MEQ tablet Take 1 tablet (20 mEq total) by mouth 2 (two) times daily.   tamsulosin (FLOMAX) 0.4 MG CAPS capsule Take 1 capsule (0.4 mg  total) by mouth daily.   torsemide (DEMADEX) 10 MG tablet Take 1 tablet (10 mg total) by mouth every other day.   Vibegron (GEMTESA) 75 MG TABS TAKE 1 TABLET DAILY   Vitamin D, Ergocalciferol, (DRISDOL) 1.25 MG (50000 UNIT) CAPS capsule Take 1 capsule (50,000 Units total) by mouth every 7 (seven) days.   XTANDI 80 MG tablet TAKE 1 TABLET DAILY   No facility-administered encounter medications on file as of 07/13/2023.    Allergies (verified) Ace inhibitors, Oxycodone, and Penicillins   History: Past Medical History:  Diagnosis Date   Allergy    ANEMIA ASSOCIATED W/OTHER [facility] NUTRITIONAL DEFIC 08/11/2010   Qualifier: Diagnosis of  By: Yetta Barre MD, Bernadene Bell.    Cancer [facility])    skin   Cataract    Chronic combined systolic and diastolic congestive heart failure (HCC) 10/30/2018   COPD with asthma (HCC) 03/02/2017   CRI (chronic renal insufficiency), stage 3 (moderate) (HCC) 10/25/2018   Deficiency anemia 10/25/2018   Diabetes mellitus    type 2   DOE (dyspnea on exertion) 09/03/2018   Edema 06/11/2009   Qualifier: Diagnosis of  By: Yetta Barre MD, Bernadene Bell.    History of skin cancer    Hyperlipidemia with target LDL less than 100 11/10/2008        Hypertension    Memory loss 05/14/2009   Obesity (BMI 30.0-34.9) 04/01/2013   Osteoarthritis    PSA elevation 10/25/2018   SKIN CANCER, HX OF 11/10/2008   Qualifier: Diagnosis of  By: Yetta Barre MD, Bernadene Bell.    Snoring 04/03/2014   Type II diabetes mellitus with manifestations (HCC) 11/10/2008   Estimated Creatinine Clearance: 38 mL/min (A) (by C-G formula based on SCr of 1.52 mg/dL (H)).    Venous stasis dermatitis of both lower extremities 01/29/2018   Vitamin D deficiency 11/10/2008   Past Surgical History:  Procedure Laterality Date   CHOLECYSTECTOMY     INGUINAL HERNIA REPAIR     x 3   IR FLUORO GUIDED NEEDLE PLC ASPIRATION/INJECTION LOC  10/07/2022   JOINT REPLACEMENT     KNEE SURGERY     x 2   MELANOMA EXCISION     x 3 -- Left arm   TOTAL KNEE  ARTHROPLASTY     x 3   Family History  Problem Relation Age of Onset   Heart disease Mother    Diabetes Mother    Heart disease Father    Cancer Sister    Cancer Brother    Diabetes Son    Diabetes Other    Social History   Socioeconomic History   Marital status: Married    Spouse name: Aurea Graff   Number of children: 4   Years of education: Not on file   Highest education level: Not on file  Occupational History   Occupation: retired    Associate Professor: RETIRED  Tobacco Use   Smoking status: Never   Smokeless tobacco: Never  Vaping Use   Vaping status: Never Used  Substance and Sexual Activity   Alcohol use: No   Drug use: No   Sexual activity: Yes    Birth control/protection: None  Other Topics Concern   Not on file  Social History Narrative   No regular exercise   Lives with wife.     Social Determinants of Health   Financial Resource Strain: Low Risk  (07/13/2023)   Overall Financial Resource Strain (CARDIA)    Difficulty of Paying Living Expenses: Not hard at all  Food Insecurity: No Food Insecurity (07/13/2023)   Hunger Vital Sign    Worried About Running Out of Food in the Last Year: Never true    Ran Out of Food in the Last Year: Never true  Transportation Needs: No Transportation Needs (07/13/2023)   PRAPARE - Administrator, Civil Service (Medical): No    Lack of Transportation (Non-Medical): No  Physical Activity: Sufficiently Active (07/13/2023)   Exercise Vital Sign    Days of Exercise per Week: 6 days    Minutes of Exercise per Session: 30 min  Stress: No Stress Concern Present (07/11/2022)   [employer] of Occupational Health - Occupational Stress Questionnaire    Feeling of Stress : Not at all  Social Connections: Moderately Isolated (07/13/2023)   Social Connection and Isolation Panel [NHANES]    Frequency of Communication with Friends and Family: Never    Frequency of Social Gatherings with Friends and Family: Never    Attends  Religious Services: Never    Database administrator or Organizations: Yes    Attends Engineer, structural: Never    Marital Status: Married    Tobacco Counseling Counseling given: Not Answered   Clinical Intake:  Pre-visit preparation completed: Yes  Pain : No/denies pain     BMI - recorded: 25.99 Nutritional Status: BMI 25 -29 Overweight Nutritional Risks: Nausea/ vomitting/ diarrhea (diarhhea) Diabetes: Yes CBG done?: Yes CBG resulted in Enter/ Edit results?: No Did pt. bring in CBG monitor from home?: No  How often do you need to have someone help you when you read instructions, pamphlets, or other written materials from your doctor or pharmacy?: 5 - Always  Interpreter Needed?: No  Information entered by :: Makynleigh Breslin, RMA   Activities of Daily Living    07/13/2023    2:55 PM 10/14/2022    1:00 PM  In your present state of health, do you have any difficulty performing the following activities:  Hearing? 1   Vision? 1   Difficulty concentrating or making decisions? 1   Walking or climbing stairs? 1   Dressing or bathing? 1   Comment has a care taker   Doing errands, shopping? 1 1  Comment a Family member drives him   Quarry manager and eating ? Y   Using the Toilet? Y   In the past six months, have you accidently leaked urine? Y   Comment has a cath   Do you have problems with loss of bowel control? N   Managing your Medications? Y   Managing your Finances? Y   Housekeeping or managing your Housekeeping? Y     Patient Care Team: Etta Grandchild, MD as PCP - General Eldridge Dace Donnie Coffin, MD as PCP - Cardiology (Cardiology) Szabat, Vinnie Level, [place] (Inactive) as Pharmacist (Pharmacist) Stephannie Li, MD as Consulting Physician (Ophthalmology) Johney Maine,  MD as Consulting Physician (Hematology)  Indicate any recent Medical Services you may have received from other than [facility] providers in the past year (date may be approximate).      Assessment:   This is a routine wellness examination for [employer].  Hearing/Vision screen Hearing Screening - Comments:: Wears hearing aides Vision Screening - Comments:: Wears eyeglasses   Goals Addressed   None   Depression Screen    10/27/2022   11:30 AM 10/27/2022    9:00 AM 09/07/2022    3:50 PM 07/11/2022    3:49 PM 12/27/2021    3:00 PM 07/09/2021    1:55 PM 07/09/2021    1:47 PM  PHQ 2/9 Scores  PHQ - 2 Score 0 0 0 0 0 0 0  PHQ- 9 Score 0 0         Fall Risk    07/13/2023    3:00 PM 01/17/2023    1:13 PM 10/27/2022    9:00 AM 09/07/2022    3:50 PM 07/11/2022    3:20 PM  Fall Risk   Falls in the past year? 1 1 1  0 1  Number falls in past yr: 1 1 1  0 1  Injury with Fall? 0 1 0 0 1  Risk for fall due to : Impaired mobility;Impaired balance/gait;Mental status change  History of fall(s) No Fall Risks History of fall(s);Impaired balance/gait;Orthopedic patient  Risk for fall due to: Comment   wheelchair    Follow up Falls evaluation completed;Falls prevention discussed Falls evaluation completed Falls evaluation completed Falls evaluation completed Education provided;Falls prevention discussed    MEDICARE RISK AT HOME: Medicare Risk at Home Any stairs in or around the home?: Yes If so, are there any without handrails?: Yes Home free of loose throw rugs in walkways, pet beds, electrical cords, etc?: Yes Adequate lighting in your home to reduce risk of falls?: Yes Life alert?: No Use of a cane, walker or w/c?: Yes (walker) Grab bars in the bathroom?: Yes Shower chair or bench in shower?: Yes Elevated toilet seat or a handicapped toilet?: Yes  TIMED UP AND GO:  Was the test performed?  No    Cognitive Function:    12/22/2017   12:35 PM 12/19/2016   10:31 AM  MMSE - Mini Mental State Exam  Orientation to time 5 5  Orientation to Place 5 5  Registration 3 3  Attention/ Calculation 5 4  Recall 1 2  Language- name 2 objects 2 2  Language- repeat 1 1  Language-  follow 3 step command 3 3  Language- read & follow direction 1 1  Write a sentence 1 1  Copy design 1 1  Total score 28 28        07/13/2023    3:01 PM 07/11/2022    3:52 PM 03/18/2020    1:50 PM  6CIT Screen  What Year? -- 0 points 0 points  What month?  0 points 0 points  What time?  0 points 0 points  Count back from 20  0 points 0 points  Months in reverse  0 points 2 points  Repeat phrase  0 points 2 points  Total Score  0 points 4 points    Immunizations Immunization History  Administered Date(s) Administered   Fluad Quad(high Dose 65+) 05/16/2019, 05/16/2022   Fluad Trivalent(High Dose 65+) 06/22/2023   Influenza Split 05/24/2012, 04/29/2014   Influenza Whole 08/30/2007, 05/12/2010   Influenza, High Dose Seasonal PF 05/29/2012, 04/28/2015, 05/03/2016, 05/29/2017, 05/01/2018,  05/16/2022   Influenza,inj,Quad PF,6+ Mos 06/03/2013, 05/20/2014, 06/30/2017   Influenza-Unspecified 04/29/2010, 05/30/2019, 05/29/2021   Moderna Covid-19 Vaccine Bivalent Booster 46yrs & up 10/22/2021   Moderna Sars-Covid-2 Vaccination 10/12/2019, 11/10/2019, 07/01/2020   PFIZER Comirnaty(Gray Top)Covid-19 Tri-Sucrose Vaccine 05/16/2022   Pfizer(Comirnaty)Fall Seasonal Vaccine 12 years and older 05/26/2022   Pneumococcal Conjugate-13 01/01/2014, 06/12/2014   Pneumococcal Polysaccharide-23 08/29/2006, 04/01/2013, 09/17/2019   Pneumococcal-Unspecified 08/30/2007   Td 08/29/2002   Tdap 04/01/2013, 04/01/2014, 09/21/2020   Varicella 07/21/2008   Zoster Recombinant(Shingrix) 05/17/2018, 07/16/2018   Zoster, Live 07/16/2008    TDAP status: Up to date  Flu Vaccine status: Up to date  Pneumococcal vaccine status: Up to date  Covid-19 vaccine status: Information provided on how to obtain vaccines.   Qualifies for Shingles Vaccine? Yes   Zostavax completed Yes   Shingrix Completed?: Yes  Screening Tests Health Maintenance  Topic Date Due   OPHTHALMOLOGY EXAM  09/04/2020   COVID-19  Vaccine (6 - 2023-24 season) 04/30/2023   FOOT EXAM  07/27/2023   DTaP/Tdap/Td (5 - Td or Tdap) 09/21/2030   Pneumonia Vaccine 31+ Years old  Completed   INFLUENZA VACCINE  Completed   Zoster Vaccines- Shingrix  Completed   HPV VACCINES  Aged Out    Health Maintenance  Health Maintenance Due  Topic Date Due   OPHTHALMOLOGY EXAM  09/04/2020   COVID-19 Vaccine (6 - 2023-24 season) 04/30/2023    Colorectal cancer screening: No longer required.   Lung Cancer Screening: (Low Dose CT Chest recommended if Age 6-80 years, 20 pack-year currently smoking OR have quit w/in 15years.) does qualify.   Lung Cancer Screening Referral: 01/26/2023  Additional Screening:  Hepatitis C Screening: does not qualify;   Vision Screening: Recommended annual ophthalmology exams for early detection of glaucoma and other disorders of the eye. Is the patient up to date with their annual eye exam?  Yes  Who is the provider or what is the name of the office in which the patient attends annual eye exams? Dr. Lelon Perla If pt is not established with a provider, would they like to be referred to a provider to establish care? No .   Dental Screening: Recommended annual dental exams for proper oral hygiene  Diabetic Foot Exam: Diabetic Foot Exam: Completed 07/26/2022  Community Resource Referral / Chronic Care Management: CRR required this visit?  No   CCM required this visit?  No     Plan:     I have personally reviewed and noted the following in the patient's chart:   Medical and social history Use of alcohol, tobacco or illicit drugs  Current medications and supplements including opioid prescriptions. Patient is not currently taking opioid prescriptions. Functional ability and status Nutritional status Physical activity Advanced directives List of other physicians Hospitalizations, surgeries, and ER visits in previous 12 months Vitals Screenings to include cognitive, depression, and  falls Referrals and appointments  In addition, I have reviewed and discussed with patient certain preventive protocols, quality metrics, and best practice recommendations. A written personalized care plan for preventive services as well as general preventive health recommendations were provided to patient.     Bless Lisenby L Demontae Antunes, CMA   07/13/2023   After Visit Summary: (MyChart) Due to this being a telephonic visit, the after visit summary with patients personalized plan was offered to patient via MyChart   Nurse Notes: Patient's wife did this visit for patient due to his condition of Alzheimers.  Patient is due for a Covid vaccine. 6CIT Score was  not done today due to patient's condition and also the PHQ9 and Stress questions were not done due to patient's condition.  He has an appointment coming up with eye doctor for his eye exam soon.

## 2023-07-13 NOTE — Patient Instructions (Signed)
Mr. Luttrull , Thank you for taking time to come for your Medicare Wellness Visit. I appreciate your ongoing commitment to your health goals. Please review the following plan we discussed and let me know if I can assist you in the future.   Referrals/Orders/Follow-Ups/Clinician Recommendations: You are due for a Covid vaccine.  Each day, aim for 6 glasses of water, plenty of protein in your diet and try to get up and walk/ stretch every hour for 5-10 minutes at a time.    This is a list of the screening recommended for you and due dates:  Health Maintenance  Topic Date Due   COVID-19 Vaccine (6 - 2023-24 season) 08/29/2023*   Eye exam for diabetics  09/29/2023*   Complete foot exam   07/27/2023   DTaP/Tdap/Td vaccine (5 - Td or Tdap) 09/21/2030   Pneumonia Vaccine  Completed   Flu Shot  Completed   Zoster (Shingles) Vaccine  Completed   HPV Vaccine  Aged Out  *Topic was postponed. The date shown is not the original due date.    Advanced directives: (In Chart) A copy of your advanced directives are scanned into your chart should your provider ever need it.  Next Medicare Annual Wellness Visit scheduled for next year: No

## 2023-07-18 ENCOUNTER — Emergency Department (HOSPITAL_COMMUNITY): Payer: Medicare Other

## 2023-07-18 ENCOUNTER — Telehealth: Payer: Self-pay | Admitting: Internal Medicine

## 2023-07-18 ENCOUNTER — Other Ambulatory Visit: Payer: Self-pay

## 2023-07-18 ENCOUNTER — Encounter (HOSPITAL_COMMUNITY): Payer: Self-pay

## 2023-07-18 ENCOUNTER — Inpatient Hospital Stay (HOSPITAL_COMMUNITY)
Admission: EM | Admit: 2023-07-18 | Discharge: 2023-07-22 | DRG: 871 | Disposition: A | Discharge status: Skilled Nursing Facility | Payer: Medicare Other | Attending: Internal Medicine | Admitting: Internal Medicine

## 2023-07-18 DIAGNOSIS — R296 Repeated falls: Secondary | ICD-10-CM | POA: Diagnosis present

## 2023-07-18 DIAGNOSIS — N1831 Chronic kidney disease, stage 3a: Secondary | ICD-10-CM | POA: Diagnosis present

## 2023-07-18 DIAGNOSIS — J9601 Acute respiratory failure with hypoxia: Secondary | ICD-10-CM | POA: Diagnosis not present

## 2023-07-18 DIAGNOSIS — I452 Bifascicular block: Secondary | ICD-10-CM | POA: Diagnosis present

## 2023-07-18 DIAGNOSIS — J4489 Other specified chronic obstructive pulmonary disease: Secondary | ICD-10-CM | POA: Diagnosis present

## 2023-07-18 DIAGNOSIS — Z79818 Long term (current) use of other agents affecting estrogen receptors and estrogen levels: Secondary | ICD-10-CM

## 2023-07-18 DIAGNOSIS — Z794 Long term (current) use of insulin: Secondary | ICD-10-CM

## 2023-07-18 DIAGNOSIS — U071 COVID-19: Secondary | ICD-10-CM

## 2023-07-18 DIAGNOSIS — N400 Enlarged prostate without lower urinary tract symptoms: Secondary | ICD-10-CM | POA: Diagnosis not present

## 2023-07-18 DIAGNOSIS — Z96659 Presence of unspecified artificial knee joint: Secondary | ICD-10-CM | POA: Diagnosis present

## 2023-07-18 DIAGNOSIS — I130 Hypertensive heart and chronic kidney disease with heart failure and stage 1 through stage 4 chronic kidney disease, or unspecified chronic kidney disease: Secondary | ICD-10-CM | POA: Diagnosis present

## 2023-07-18 DIAGNOSIS — I10 Essential (primary) hypertension: Secondary | ICD-10-CM | POA: Diagnosis present

## 2023-07-18 DIAGNOSIS — Z7952 Long term (current) use of systemic steroids: Secondary | ICD-10-CM

## 2023-07-18 DIAGNOSIS — Z79899 Other long term (current) drug therapy: Secondary | ICD-10-CM

## 2023-07-18 DIAGNOSIS — A419 Sepsis, unspecified organism: Secondary | ICD-10-CM | POA: Diagnosis not present

## 2023-07-18 DIAGNOSIS — R739 Hyperglycemia, unspecified: Secondary | ICD-10-CM | POA: Diagnosis not present

## 2023-07-18 DIAGNOSIS — F05 Delirium due to known physiological condition: Secondary | ICD-10-CM | POA: Diagnosis not present

## 2023-07-18 DIAGNOSIS — E119 Type 2 diabetes mellitus without complications: Secondary | ICD-10-CM | POA: Diagnosis not present

## 2023-07-18 DIAGNOSIS — C7951 Secondary malignant neoplasm of bone: Secondary | ICD-10-CM | POA: Diagnosis present

## 2023-07-18 DIAGNOSIS — E1165 Type 2 diabetes mellitus with hyperglycemia: Secondary | ICD-10-CM | POA: Diagnosis present

## 2023-07-18 DIAGNOSIS — N390 Urinary tract infection, site not specified: Secondary | ICD-10-CM

## 2023-07-18 DIAGNOSIS — E1122 Type 2 diabetes mellitus with diabetic chronic kidney disease: Secondary | ICD-10-CM | POA: Diagnosis not present

## 2023-07-18 DIAGNOSIS — N179 Acute kidney failure, unspecified: Secondary | ICD-10-CM | POA: Diagnosis not present

## 2023-07-18 DIAGNOSIS — R4182 Altered mental status, unspecified: Secondary | ICD-10-CM | POA: Diagnosis not present

## 2023-07-18 DIAGNOSIS — Z833 Family history of diabetes mellitus: Secondary | ICD-10-CM

## 2023-07-18 DIAGNOSIS — Z7984 Long term (current) use of oral hypoglycemic drugs: Secondary | ICD-10-CM

## 2023-07-18 DIAGNOSIS — N401 Enlarged prostate with lower urinary tract symptoms: Secondary | ICD-10-CM | POA: Diagnosis not present

## 2023-07-18 DIAGNOSIS — Z8249 Family history of ischemic heart disease and other diseases of the circulatory system: Secondary | ICD-10-CM

## 2023-07-18 DIAGNOSIS — G301 Alzheimer's disease with late onset: Secondary | ICD-10-CM | POA: Diagnosis present

## 2023-07-18 DIAGNOSIS — E785 Hyperlipidemia, unspecified: Secondary | ICD-10-CM | POA: Diagnosis present

## 2023-07-18 DIAGNOSIS — M4312 Spondylolisthesis, cervical region: Secondary | ICD-10-CM | POA: Diagnosis not present

## 2023-07-18 DIAGNOSIS — F0280 Dementia in other diseases classified elsewhere without behavioral disturbance: Secondary | ICD-10-CM | POA: Diagnosis present

## 2023-07-18 DIAGNOSIS — Z66 Do not resuscitate: Secondary | ICD-10-CM | POA: Diagnosis present

## 2023-07-18 DIAGNOSIS — Z885 Allergy status to narcotic agent status: Secondary | ICD-10-CM

## 2023-07-18 DIAGNOSIS — E782 Mixed hyperlipidemia: Secondary | ICD-10-CM | POA: Diagnosis not present

## 2023-07-18 DIAGNOSIS — W19XXXA Unspecified fall, initial encounter: Secondary | ICD-10-CM | POA: Diagnosis not present

## 2023-07-18 DIAGNOSIS — R0689 Other abnormalities of breathing: Secondary | ICD-10-CM | POA: Diagnosis not present

## 2023-07-18 DIAGNOSIS — C61 Malignant neoplasm of prostate: Secondary | ICD-10-CM | POA: Diagnosis not present

## 2023-07-18 DIAGNOSIS — E860 Dehydration: Secondary | ICD-10-CM | POA: Diagnosis present

## 2023-07-18 DIAGNOSIS — G319 Degenerative disease of nervous system, unspecified: Secondary | ICD-10-CM | POA: Diagnosis not present

## 2023-07-18 DIAGNOSIS — Z85820 Personal history of malignant melanoma of skin: Secondary | ICD-10-CM | POA: Diagnosis not present

## 2023-07-18 DIAGNOSIS — A4189 Other specified sepsis: Principal | ICD-10-CM | POA: Diagnosis present

## 2023-07-18 DIAGNOSIS — R000 Tachycardia, unspecified: Secondary | ICD-10-CM | POA: Diagnosis not present

## 2023-07-18 DIAGNOSIS — I5042 Chronic combined systolic (congestive) and diastolic (congestive) heart failure: Secondary | ICD-10-CM | POA: Diagnosis not present

## 2023-07-18 DIAGNOSIS — Z7401 Bed confinement status: Secondary | ICD-10-CM | POA: Diagnosis not present

## 2023-07-18 DIAGNOSIS — N183 Chronic kidney disease, stage 3 unspecified: Secondary | ICD-10-CM | POA: Diagnosis not present

## 2023-07-18 DIAGNOSIS — Z809 Family history of malignant neoplasm, unspecified: Secondary | ICD-10-CM

## 2023-07-18 DIAGNOSIS — R6520 Severe sepsis without septic shock: Secondary | ICD-10-CM | POA: Diagnosis not present

## 2023-07-18 DIAGNOSIS — R339 Retention of urine, unspecified: Secondary | ICD-10-CM

## 2023-07-18 DIAGNOSIS — Z888 Allergy status to other drugs, medicaments and biological substances status: Secondary | ICD-10-CM

## 2023-07-18 DIAGNOSIS — M6259 Muscle wasting and atrophy, not elsewhere classified, multiple sites: Secondary | ICD-10-CM | POA: Diagnosis not present

## 2023-07-18 DIAGNOSIS — H9190 Unspecified hearing loss, unspecified ear: Secondary | ICD-10-CM | POA: Diagnosis present

## 2023-07-18 DIAGNOSIS — Z880 Allergy status to penicillin: Secondary | ICD-10-CM

## 2023-07-18 DIAGNOSIS — M6281 Muscle weakness (generalized): Secondary | ICD-10-CM | POA: Diagnosis not present

## 2023-07-18 DIAGNOSIS — N3000 Acute cystitis without hematuria: Secondary | ICD-10-CM | POA: Diagnosis not present

## 2023-07-18 DIAGNOSIS — I4510 Unspecified right bundle-branch block: Secondary | ICD-10-CM | POA: Diagnosis not present

## 2023-07-18 DIAGNOSIS — Z741 Need for assistance with personal care: Secondary | ICD-10-CM | POA: Diagnosis not present

## 2023-07-18 DIAGNOSIS — F0390 Unspecified dementia without behavioral disturbance: Secondary | ICD-10-CM | POA: Diagnosis not present

## 2023-07-18 LAB — LIPASE, BLOOD: Lipase: 21 U/L (ref 11–51)

## 2023-07-18 LAB — PROTIME-INR
INR: 1 (ref 0.8–1.2)
Prothrombin Time: 13.5 s (ref 11.4–15.2)

## 2023-07-18 LAB — I-STAT CG4 LACTIC ACID, ED: Lactic Acid, Venous: 1.5 mmol/L (ref 0.5–1.9)

## 2023-07-18 LAB — URINALYSIS, W/ REFLEX TO CULTURE (INFECTION SUSPECTED)
Bacteria, UA: NONE SEEN
Bilirubin Urine: NEGATIVE
Glucose, UA: >=500 mg/dL — AB
Ketones, ur: 5 mg/dL — AB
Nitrite: NEGATIVE
Protein, ur: NEGATIVE mg/dL
Specific Gravity, Urine: 1.011 (ref 1.005–1.030)
WBC, UA: >50 WBC/hpf (ref 0–5)
pH: 7 (ref 5.0–8.0)

## 2023-07-18 LAB — CBC WITH DIFFERENTIAL/PLATELET
Abs Immature Granulocytes: 0.17 10*3/uL — ABNORMAL HIGH (ref 0.00–0.07)
Basophils Absolute: 0 10*3/uL (ref 0.0–0.1)
Basophils Relative: 0 %
Eosinophils Absolute: 0 10*3/uL (ref 0.0–0.5)
Eosinophils Relative: 0 %
HCT: 39.2 % (ref 39.0–52.0)
Hemoglobin: 12.7 g/dL — ABNORMAL LOW (ref 13.0–17.0)
Immature Granulocytes: 1 %
Lymphocytes Relative: 5 %
Lymphs Abs: 0.9 10*3/uL (ref 0.7–4.0)
MCH: 29.1 pg (ref 26.0–34.0)
MCHC: 32.4 g/dL (ref 30.0–36.0)
MCV: 89.9 fL (ref 80.0–100.0)
Monocytes Absolute: 1.6 10*3/uL — ABNORMAL HIGH (ref 0.1–1.0)
Monocytes Relative: 10 %
Neutro Abs: 14 10*3/uL — ABNORMAL HIGH (ref 1.7–7.7)
Neutrophils Relative %: 84 %
Platelets: 206 10*3/uL (ref 150–400)
RBC: 4.36 MIL/uL (ref 4.22–5.81)
RDW: 14.5 % (ref 11.5–15.5)
WBC: 16.7 10*3/uL — ABNORMAL HIGH (ref 4.0–10.5)
nRBC: 0 % (ref 0.0–0.2)

## 2023-07-18 LAB — COMPREHENSIVE METABOLIC PANEL WITH GFR
ALT: 12 U/L (ref 0–44)
AST: 23 U/L (ref 15–41)
Albumin: 2.8 g/dL — ABNORMAL LOW (ref 3.5–5.0)
Alkaline Phosphatase: 125 U/L (ref 38–126)
Anion gap: 11 (ref 5–15)
BUN: 20 mg/dL (ref 8–23)
CO2: 25 mmol/L (ref 22–32)
Calcium: 8.6 mg/dL — ABNORMAL LOW (ref 8.9–10.3)
Chloride: 101 mmol/L (ref 98–111)
Creatinine, Ser: 1.54 mg/dL — ABNORMAL HIGH (ref 0.61–1.24)
GFR, Estimated: 42 mL/min — ABNORMAL LOW
Glucose, Bld: 384 mg/dL — ABNORMAL HIGH (ref 70–99)
Potassium: 4.2 mmol/L (ref 3.5–5.1)
Sodium: 137 mmol/L (ref 135–145)
Total Bilirubin: 1 mg/dL
Total Protein: 5.9 g/dL — ABNORMAL LOW (ref 6.5–8.1)

## 2023-07-18 LAB — APTT: aPTT: 31 s (ref 24–36)

## 2023-07-18 LAB — RESP PANEL BY RT-PCR (RSV, FLU A&B, COVID)  RVPGX2
Influenza A by PCR: NEGATIVE
Influenza B by PCR: NEGATIVE
Resp Syncytial Virus by PCR: NEGATIVE
SARS Coronavirus 2 by RT PCR: POSITIVE — AB

## 2023-07-18 LAB — BRAIN NATRIURETIC PEPTIDE: B Natriuretic Peptide: 126.3 pg/mL — ABNORMAL HIGH (ref 0.0–100.0)

## 2023-07-18 MED ORDER — ACETAMINOPHEN 650 MG RE SUPP
650.0000 mg | Freq: Once | RECTAL | Status: AC
  Administered 2023-07-18: 650 mg via RECTAL
  Filled 2023-07-18: qty 1

## 2023-07-18 MED ORDER — LACTATED RINGERS IV SOLN: INTRAVENOUS | Status: AC

## 2023-07-18 MED ORDER — SODIUM CHLORIDE 0.9 % IV BOLUS: 1000.0000 mL | Freq: Once | INTRAVENOUS | Status: DC

## 2023-07-18 MED ORDER — SODIUM CHLORIDE 0.9 % IV SOLN: 2.0000 g | Freq: Once | INTRAVENOUS | Status: DC

## 2023-07-18 MED ORDER — LACTATED RINGERS IV BOLUS (SEPSIS)
1000.0000 mL | Freq: Once | INTRAVENOUS | Status: AC
  Administered 2023-07-18: 1000 mL via INTRAVENOUS

## 2023-07-18 MED ORDER — METRONIDAZOLE 500 MG/100ML IV SOLN
500.0000 mg | Freq: Once | INTRAVENOUS | Status: AC
  Administered 2023-07-18: 500 mg via INTRAVENOUS
  Filled 2023-07-18: qty 100

## 2023-07-18 MED ORDER — VANCOMYCIN HCL 1500 MG/300ML IV SOLN
1500.0000 mg | Freq: Once | INTRAVENOUS | Status: AC
  Administered 2023-07-18: 1500 mg via INTRAVENOUS
  Filled 2023-07-18: qty 300

## 2023-07-18 MED ORDER — SODIUM CHLORIDE 0.9 % IV SOLN
2.0000 g | Freq: Once | INTRAVENOUS | Status: AC
  Administered 2023-07-18: 2 g via INTRAVENOUS
  Filled 2023-07-18: qty 12.5

## 2023-07-18 MED ORDER — VANCOMYCIN HCL 500 MG/100ML IV SOLN: 500.0000 mg | Freq: Once | INTRAVENOUS | Status: DC

## 2023-07-18 MED ORDER — VANCOMYCIN HCL IN DEXTROSE 1-5 GM/200ML-% IV SOLN: 1000.0000 mg | Freq: Once | INTRAVENOUS | Status: DC

## 2023-07-18 MED ORDER — ACETAMINOPHEN 325 MG PO TABS: 650.0000 mg | ORAL_TABLET | Freq: Once | ORAL | Status: DC

## 2023-07-18 NOTE — Progress Notes (Signed)
Elink monitoring for the code sepsis protocol.  

## 2023-07-18 NOTE — ED Provider Notes (Signed)
[place] [facility]MENT AT [facility] Provider Note   CSN: [id number] Arrival date & time: 07/18/23  1836     History  Chief Complaint  Patient presents with   Altered Mental Status    Nicholas Burton is a 89 y.o. male  PMH significant for prostate cancer metastatic to the bone, dementia, T2DM on insulin, HTN, COPD, HF, CKD, and CHF who presents to the ED today for AMS, tachycardia, fever, and hypoxia. Caregiver is at bedside for collateral. She states that patient is normally A&O x 4; however, she states he has been confused and unable to complete his ADL's since this morning. Of note, caregiver states that patient fell at 2 AM this morning and was found on the floor at the end of the bed. Not on any blood thinners. This morning, patient was putting toothpaste and hand soap on his hair, which is atypical for patient. She also states that patient's glucose was 481 for EMS today. She states that she was waving her hands in front of his face earlier today; however, she states that she did not feel like he could see her. Caregiver also notes that patient has had a "cough and cold" since this weekend. Patient is unable to answer questions for himself at this time due to altered mental status. Level 5 caveat.   History obtained from patient and past medical records. No interpreter used during encounter.       Home Medications Prior to Admission medications   Medication Sig Start Date End Date Taking? Authorizing Provider  acetaminophen (TYLENOL) 325 MG tablet Take 1-2 tablets (325-650 mg total) by mouth every 6 (six) hours as needed for mild pain. 10/21/22  Yes Angiulli, Mcarthur Rossetti, PA-C  cholecalciferol (VITAMIN D) 1000 units tablet Take 1 tablet (1,000 Units total) by mouth daily. Patient taking differently: Take 1,000 Units by mouth every evening. 10/21/22  Yes Angiulli, Mcarthur Rossetti, PA-C  Colesevelam HCl 3.75 g PACK Take 1 packet by mouth daily. 02/06/23  Yes Corky Crafts,  MD  cyanocobalamin (VITAMIN B12) 500 MCG tablet Take 1 tablet (500 mcg total) by mouth daily. 10/21/22  Yes Angiulli, Mcarthur Rossetti, PA-C  dexamethasone (DECADRON) 4 MG tablet Take 1 tablet (4 mg total) by mouth daily. 02/20/23  Yes Etta Grandchild, MD  empagliflozin (JARDIANCE) 10 MG TABS tablet Take 1 tablet (10 mg total) by mouth daily before breakfast. 07/05/23  Yes Etta Grandchild, MD  LANTUS SOLOSTAR 100 UNIT/ML Solostar Pen Inject 50 Units into the skin daily. Patient taking differently: Inject 50 Units into the skin at bedtime. 06/23/23  Yes Etta Grandchild, MD  magnesium gluconate (MAGONATE) 500 MG tablet Take 1 tablet (500 mg total) by mouth at bedtime. 10/21/22  Yes Angiulli, Mcarthur Rossetti, PA-C  melatonin 3 MG TABS tablet Take 1 tablet (3 mg total) by mouth at bedtime as needed. 10/21/22  Yes Angiulli, Mcarthur Rossetti, PA-C  metoprolol tartrate (LOPRESSOR) 25 MG tablet TAKE ONE-HALF (1/2) TABLET TWICE A DAY 01/25/23  Yes Corky Crafts, MD  Multiple Vitamins-Minerals (PRESERVISION/LUTEIN) CAPS Take 1 capsule by mouth 2 (two) times daily.   Yes [provider]  tamsulosin (FLOMAX) 0.4 MG CAPS capsule Take 1 capsule (0.4 mg total) by mouth daily. 02/20/23  Yes Etta Grandchild, MD  torsemide (DEMADEX) 10 MG tablet Take 1 tablet (10 mg total) by mouth every other day. 02/06/23  Yes Corky Crafts, MD  Vibegron (GEMTESA) 75 MG TABS TAKE 1 TABLET DAILY  06/05/23  Yes Etta Grandchild, MD  XTANDI 80 MG tablet TAKE 1 TABLET DAILY 06/23/23  Yes Johney Maine, MD  atorvastatin (LIPITOR) 10 MG tablet Take 1 tablet (10 mg total) by mouth daily. Pt needs to keep upcoming appt in Oct for further refills Patient not taking: Reported on 07/18/2023 01/31/23   Corky Crafts, MD  Insulin Pen Needle (NOVOFINE) 30G X 8 MM MISC Inject 10 each into the skin as needed. 02/20/23   Etta Grandchild, MD      Allergies    Ace inhibitors, Oxycodone, and Penicillins    Review of Systems   Review of Systems   Unable to perform ROS: Mental status change    Physical Exam Updated Vital Signs BP (!) 151/76   Pulse (!) 104   Temp (!) 101 F (38.3 C) (Rectal)   Resp (!) 22   SpO2 97%  Physical Exam Vitals and nursing note reviewed.  Constitutional:      General: He is not in acute distress.    Appearance: He is not ill-appearing.  HENT:     Head: Normocephalic.  Eyes:     Pupils: Pupils are equal, round, and reactive to light.  Cardiovascular:     Rate and Rhythm: Regular rhythm. Tachycardia present.     Pulses: Normal pulses.     Heart sounds: Normal heart sounds. No murmur heard.    No friction rub. No gallop.  Pulmonary:     Effort: Pulmonary effort is normal.     Breath sounds: Normal breath sounds.  Abdominal:     General: Abdomen is flat. There is no distension.     Palpations: Abdomen is soft.     Tenderness: There is abdominal tenderness. There is no guarding or rebound.     Comments: Diffuse tenderness  Musculoskeletal:        General: Normal range of motion.     Cervical back: Neck supple.  Skin:    General: Skin is warm and dry.  Neurological:     Mental Status: He is alert.     Comments: Awake; however does not respond to questions. Moves all 4 extremities without difficulty.   Psychiatric:        Mood and Affect: Mood normal.        Behavior: Behavior normal.     ED Results / Procedures / Treatments   Labs (all labs ordered are listed, but only abnormal results are displayed) Labs Reviewed  RESP PANEL BY RT-PCR (RSV, FLU A&B, COVID)  RVPGX2 - Abnormal; Notable for the following components:      Result Value   SARS Coronavirus 2 by RT PCR POSITIVE (*)    All other components within normal limits  COMPREHENSIVE METABOLIC PANEL - Abnormal; Notable for the following components:   Glucose, Bld 384 (*)    Creatinine, Ser 1.54 (*)    Calcium 8.6 (*)    Total Protein 5.9 (*)    Albumin 2.8 (*)    GFR, Estimated 42 (*)    All other components within normal  limits  CBC WITH DIFFERENTIAL/PLATELET - Abnormal; Notable for the following components:   WBC 16.7 (*)    Hemoglobin 12.7 (*)    Neutro Abs 14.0 (*)    Monocytes Absolute 1.6 (*)    Abs Immature Granulocytes 0.17 (*)    All other components within normal limits  URINALYSIS, W/ REFLEX TO CULTURE (INFECTION SUSPECTED) - Abnormal; Notable for the following components:   Color,  Urine STRAW (*)    APPearance HAZY (*)    Glucose, UA >=500 (*)    Hgb urine dipstick SMALL (*)    Ketones, ur 5 (*)    Leukocytes,Ua LARGE (*)    All other components within normal limits  BRAIN NATRIURETIC PEPTIDE - Abnormal; Notable for the following components:   B Natriuretic Peptide 126.3 (*)    All other components within normal limits  URINE CULTURE  CULTURE, BLOOD (ROUTINE X 2)  CULTURE, BLOOD (ROUTINE X 2)  PROTIME-INR  APTT  LIPASE, BLOOD  I-STAT CG4 LACTIC ACID, ED  I-STAT CG4 LACTIC ACID, ED    EKG None  Radiology CT Head Wo Contrast  Result Date: 07/18/2023 CLINICAL DATA:  Altered mental status EXAM: CT HEAD WITHOUT CONTRAST CT CERVICAL SPINE WITHOUT CONTRAST TECHNIQUE: Multidetector CT imaging of the head and cervical spine was performed following the standard protocol without intravenous contrast. Multiplanar CT image reconstructions of the cervical spine were also generated. RADIATION DOSE REDUCTION: This exam was performed according to the departmental dose-optimization program which includes automated exposure control, adjustment of the mA and/or kV according to patient size and/or use of iterative reconstruction technique. COMPARISON:  10/05/2022 CT head, no prior CT cervical spine, correlation is made with 04/10/2012 cervical spine radiographs FINDINGS: Evaluation of the head and cervical spine is limited by motion. CT HEAD FINDINGS Brain: No evidence of acute infarct, hemorrhage, mass, mass effect, or midline shift. No hydrocephalus or extra-axial fluid collection. Age related cerebral  atrophy. Vascular: No hyperdense vessel. Atherosclerotic calcifications in the intracranial carotid and vertebral arteries. Skull: Negative for fracture or focal lesion. Sinuses/Orbits: Complete opacification of the right maxillary sinus. Mucosal thickening in the left maxillary sinus and bilateral ethmoid air cells. Status post bilateral lens replacements. Other: The mastoid air cells are well aerated. CT CERVICAL SPINE FINDINGS Alignment: No traumatic listhesis. Mild anterolisthesis of C2 on C3, C3 on C4, and C7 on T1 Skull base and vertebrae: No acute fracture or suspicious osseous lesion. Soft tissues and spinal canal: No prevertebral fluid or swelling. No visible canal hematoma. Disc levels: Degenerative changes in the cervical spine.Limited evaluation given the degree of motion. Upper chest: No focal pulmonary opacity or pleural effusion. IMPRESSION: Evaluation of the head and cervical spine is limited by motion. Within this limitation, no acute intracranial process. No acute fracture or traumatic listhesis in the cervical spine. Electronically Signed   By: Wiliam Ke M.D.   On: 07/18/2023 22:28   CT CERVICAL SPINE WO CONTRAST  Result Date: 07/18/2023 CLINICAL DATA:  Altered mental status EXAM: CT HEAD WITHOUT CONTRAST CT CERVICAL SPINE WITHOUT CONTRAST TECHNIQUE: Multidetector CT imaging of the head and cervical spine was performed following the standard protocol without intravenous contrast. Multiplanar CT image reconstructions of the cervical spine were also generated. RADIATION DOSE REDUCTION: This exam was performed according to the departmental dose-optimization program which includes automated exposure control, adjustment of the mA and/or kV according to patient size and/or use of iterative reconstruction technique. COMPARISON:  10/05/2022 CT head, no prior CT cervical spine, correlation is made with 04/10/2012 cervical spine radiographs FINDINGS: Evaluation of the head and cervical spine is  limited by motion. CT HEAD FINDINGS Brain: No evidence of acute infarct, hemorrhage, mass, mass effect, or midline shift. No hydrocephalus or extra-axial fluid collection. Age related cerebral atrophy. Vascular: No hyperdense vessel. Atherosclerotic calcifications in the intracranial carotid and vertebral arteries. Skull: Negative for fracture or focal lesion. Sinuses/Orbits: Complete opacification of the right maxillary sinus.  Mucosal thickening in the left maxillary sinus and bilateral ethmoid air cells. Status post bilateral lens replacements. Other: The mastoid air cells are well aerated. CT CERVICAL SPINE FINDINGS Alignment: No traumatic listhesis. Mild anterolisthesis of C2 on C3, C3 on C4, and C7 on T1 Skull base and vertebrae: No acute fracture or suspicious osseous lesion. Soft tissues and spinal canal: No prevertebral fluid or swelling. No visible canal hematoma. Disc levels: Degenerative changes in the cervical spine.Limited evaluation given the degree of motion. Upper chest: No focal pulmonary opacity or pleural effusion. IMPRESSION: Evaluation of the head and cervical spine is limited by motion. Within this limitation, no acute intracranial process. No acute fracture or traumatic listhesis in the cervical spine. Electronically Signed   By: Wiliam Ke M.D.   On: 07/18/2023 22:28   DG Chest Portable 1 View  Result Date: 07/18/2023 CLINICAL DATA:  AMS Reason for exam: ams, declining mental status; best attainable images due to pt condition and inability to following directions/hold still EXAM: PORTABLE CHEST 1 VIEW COMPARISON:  Chest x-ray 06/02/2021, CT chest 08/11/2023 FINDINGS: Slightly limited evaluation due to patient positioning. The heart and mediastinal contours are grossly unchanged. Atherosclerotic plaque No focal consolidation. No pulmonary edema. Blunting of the left costophrenic angle likely due to positioning. No pleural effusion. No pneumothorax. No acute osseous abnormality.  IMPRESSION: No active disease. Slightly limited evaluation due to patient positioning. Electronically Signed   By: Tish Frederickson M.D.   On: 07/18/2023 22:23    Procedures Procedures    Medications Ordered in ED Medications  lactated ringers infusion ( Intravenous New Bag/Given 07/18/23 2002)  vancomycin (VANCOREADY) IVPB 1500 mg/300 mL (1,500 mg Intravenous New Bag/Given 07/18/23 2146)  lactated ringers bolus 1,000 mL (0 mLs Intravenous Stopped 07/18/23 2220)  metroNIDAZOLE (FLAGYL) IVPB 500 mg (0 mg Intravenous Stopped 07/18/23 2145)  ceFEPIme (MAXIPIME) 2 g in sodium chloride 0.9 % 100 mL IVPB (0 g Intravenous Stopped 07/18/23 2145)  acetaminophen (TYLENOL) suppository 650 mg (650 mg Rectal Given 07/18/23 2111)    ED Course/ Medical Decision Making/ A&P Clinical Course as of 07/18/23 2316  Tue Jul 18, 2023  2216 Leukocytes,Ua(!): LARGE [CA]  2216 WBC, UA: >50 [CA]  2241 SARS Coronavirus 2 by RT PCR(!): POSITIVE [CA]    Clinical Course User Index [CA] Mannie Stabile, PA-C                                 Medical Decision Making Amount and/or Complexity of Data Reviewed Independent Historian: caregiver External Data Reviewed: notes. Labs: ordered. Decision-making details documented in ED Course. Radiology: ordered and independent interpretation performed. Decision-making details documented in ED Course. ECG/medicine tests: ordered and independent interpretation performed. Decision-making details documented in ED Course.  Risk OTC drugs. Prescription drug management. Decision regarding hospitalization.   This patient presents to the ED for concern of AMS, this involves an extensive number of treatment options, and is a complaint that carries with it a high risk of complications and morbidity.  The differential diagnosis includes sepsis, intracranial bleed, PNA, UTI, etc  89 year old male presents to the ED due to altered mental status.  Patient found to be hypoxic  upon arrival.  Patient placed on 4 L on cannula.  Code sepsis activated after initial evaluation. IVFs and antibiotics started.  Per caregiver at bedside, patient found on the ground early this morning.  CT head and cervical spine ordered. Sepsis labs ordered.  CBC significant for leukocytosis at 16.7.  Mild anemia with hemoglobin 12.7.  CMP significant for hyperglycemia at 384.  No anion gap.  Elevated creatinine 1.54. lactic acid normal.   UA significant for large leukocytes and greater than 50 white blood cells.  No bacteria.  Low suspicion for acute cystitis.  Urine culture pending.  Chest x-ray personally viewed and interpreted which is negative for any active disease however, given patient's symptoms suspect sepsis likely secondary to respiratory source.  Will hold on CTA at this time given patient's symptoms appear to be more infectious in nature.  Will discuss with hospitalist for admission for sepsis. Discussed with Dr. Maple Hudson who agrees with assessment and plan. CT head/cervical spine limits due to motion; however no obvious abnormalities. No evidence of intracranial bleed.   COVID positive.   Co morbidities that complicate the patient evaluation  HTN, DM Cardiac Monitoring: / EKG:  The patient was maintained on a cardiac monitor.  I personally viewed and interpreted the cardiac monitored which showed an underlying rhythm of: sinus tachycardia  Social Determinants of Health:  Elderly age  Test / Admission - Considered:  Considered CTA chest to rule out PE; however symptoms appear more infectious in nature  11:20 PM Discussed with Dr. Cyndia Bent with [facility] who agrees to admit patient.         Final Clinical Impression(s) / ED Diagnoses Final diagnoses:  Altered mental status, unspecified altered mental status type  COVID-19 virus infection    Rx / DC Orders ED Discharge Orders     None         Mannie Stabile, PA-C 07/18/23 2320    Coral Spikes, DO 07/18/23  2332

## 2023-07-18 NOTE — Progress Notes (Signed)
ED Pharmacy Antibiotic Sign Off An antibiotic consult was received from an ED provider for vanc/cefepime per pharmacy dosing for infection of uknown source. A chart review was completed to assess appropriateness.   The following one time order(s) were placed:  Vanc 1.5 g IV once Cefepime 2g IV once  Further antibiotic and/or antibiotic pharmacy consults should be ordered by the admitting provider if indicated.   Thank you for allowing pharmacy to be a part of this patient's care.   Knute Neu, [facility]  Clinical Pharmacist 07/18/23 7:28 PM

## 2023-07-18 NOTE — ED Notes (Signed)
Family reports patient's chronic foley last changed 2 weeks ago.

## 2023-07-18 NOTE — ED Notes (Addendum)
Pt 87% on room air. 4L nasal cannula initiated and O2 up to 94%

## 2023-07-18 NOTE — ED Notes (Signed)
ED TO INPATIENT HANDOFF REPORT  ED Nurse Name and Phone #: Porfirio Mylar [phone]  S Name/Age/Gender Consepcion Hearing Burton 89 y.o. male Room/Bed: 033C/033C  Code Status   Code Status: Prior  Home/SNF/Other Home Patient oriented to: self Is this baseline? No   Triage Complete: Triage complete  Chief Complaint AMS  Triage Note Pt BIB [facility] from home normally aox4 living with family, but is now aox2 per EMS. Pts RR in triage 36, SpO2 89% on RA. Chronic foley cath POA.   Allergies Allergies  Allergen Reactions   Ace Inhibitors Cough   Oxycodone Itching and Rash   Penicillins Itching and Rash    Level of Care/Admitting Diagnosis ED Disposition     ED Disposition  Admit   Condition  --   Comment  The patient appears reasonably stabilized for admission considering the current resources, flow, and capabilities available in the ED at this time, and I doubt any other [facility] requiring further screening and/or treatment in the ED prior to admission is  present.          B Medical/Surgery History Past Medical History:  Diagnosis Date   Allergy    ANEMIA ASSOCIATED W/OTHER [facility] NUTRITIONAL DEFIC 08/11/2010   Qualifier: Diagnosis of  By: Yetta Barre MD, Bernadene Bell.    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    History of skin cancer    Hyperlipidemia with target LDL less than 100 11/10/2008        Hypertension    Memory loss 05/14/2009   Obesity (BMI 30.0-34.9) 04/01/2013   Osteoarthritis    PSA elevation 10/25/2018   SKIN CANCER, HX OF 11/10/2008   Qualifier: Diagnosis of  By: Yetta Barre MD, Bernadene Bell.    Snoring 04/03/2014   Type II diabetes  mellitus with manifestations (HCC) 11/10/2008   Estimated Creatinine Clearance: 38 mL/min (A) (by C-G formula based on SCr of 1.52 mg/dL (H)).    Venous stasis dermatitis of both lower extremities 01/29/2018   Vitamin D deficiency 11/10/2008   Past Surgical History:  Procedure Laterality Date   CHOLECYSTECTOMY     INGUINAL HERNIA REPAIR     x 3   IR FLUORO GUIDED NEEDLE PLC ASPIRATION/INJECTION LOC  10/07/2022   JOINT REPLACEMENT     KNEE SURGERY     x 2   MELANOMA EXCISION     x 3 -- Left arm   TOTAL KNEE ARTHROPLASTY     x 3     A IV Location/Drains/Wounds Patient Lines/Drains/Airways Status     Active Line/Drains/Airways     Name Placement date Placement time Site Days   Peripheral IV 07/18/23 18 G 1" Anterior;Distal;Right;Upper Antecubital 07/18/23  1923  Antecubital  less than 1   Peripheral IV 07/18/23 20 G 1" Anterior;Right Forearm 07/18/23  --  Forearm  less than 1            Intake/Output Last 24 hours  Intake/Output Summary (Last 24 hours) at 07/18/2023 2256 Last data filed at 07/18/2023 2220 Gross per 24 hour  Intake 1187.82 ml  Output --  Net 1187.82 ml  Labs/Imaging Results for orders placed or performed during the hospital encounter of 07/18/23 (from the past 48 hour(s))  Comprehensive metabolic panel     Status: Abnormal   Collection Time: 07/18/23  7:04 PM  Result Value Ref Range   Sodium 137 135 - 145 mmol/L   Potassium 4.2 3.5 - 5.1 mmol/L    Comment: HEMOLYSIS AT THIS LEVEL MAY AFFECT RESULT   Chloride 101 98 - 111 mmol/L   CO2 25 22 - 32 mmol/L   Glucose, Bld 384 (H) 70 - 99 mg/dL    Comment: Glucose reference range applies only to samples taken after fasting for at least 8 hours.   BUN 20 8 - 23 mg/dL   Creatinine, Ser 6.06 (H) 0.61 - 1.24 mg/dL   Calcium 8.6 (L) 8.9 - 10.3 mg/dL   Total Protein 5.9 (L) 6.5 - 8.1 g/dL   Albumin 2.8 (L) 3.5 - 5.0 g/dL   AST 23 15 - 41 U/L    Comment: HEMOLYSIS AT THIS LEVEL MAY AFFECT RESULT   ALT 12 0  - 44 U/L    Comment: HEMOLYSIS AT THIS LEVEL MAY AFFECT RESULT   Alkaline Phosphatase 125 38 - 126 U/L   Total Bilirubin 1.0 <1.2 mg/dL    Comment: HEMOLYSIS AT THIS LEVEL MAY AFFECT RESULT   GFR, Estimated 42 (L) >60 mL/min    Comment: (NOTE) Calculated using the CKD-EPI Creatinine Equation (2021)    Anion gap 11 5 - 15    Comment: Performed at [facility], [address], [place], [place] [address]  CBC with Differential     Status: Abnormal   Collection Time: 07/18/23  7:04 PM  Result Value Ref Range   WBC 16.7 (H) 4.0 - 10.5 K/uL   RBC 4.36 4.22 - 5.81 MIL/uL   Hemoglobin 12.7 (L) 13.0 - 17.0 g/dL   HCT 10.9 32.3 - 55.7 %   MCV 89.9 80.0 - 100.0 fL   MCH 29.1 26.0 - 34.0 pg   MCHC 32.4 30.0 - 36.0 g/dL   RDW 32.2 02.5 - 42.7 %   Platelets 206 150 - 400 K/uL   nRBC 0.0 0.0 - 0.2 %   Neutrophils Relative % 84 %   Neutro Abs 14.0 (H) 1.7 - 7.7 K/uL   Lymphocytes Relative 5 %   Lymphs Abs 0.9 0.7 - 4.0 K/uL   Monocytes Relative 10 %   Monocytes Absolute 1.6 (H) 0.1 - 1.0 K/uL   Eosinophils Relative 0 %   Eosinophils Absolute 0.0 0.0 - 0.5 K/uL   Basophils Relative 0 %   Basophils Absolute 0.0 0.0 - 0.1 K/uL   Immature Granulocytes 1 %   Abs Immature Granulocytes 0.17 (H) 0.00 - 0.07 K/uL    Comment: Performed at [facility], [address] [place], [place] [address]  I-Stat Lactic Acid, ED     Status: None   Collection Time: 07/18/23  7:12 PM  Result Value Ref Range   Lactic Acid, Venous 1.5 0.5 - 1.9 mmol/L  Protime-INR     Status: None   Collection Time: 07/18/23  7:35 PM  Result Value Ref Range   Prothrombin Time 13.5 11.4 - 15.2 seconds   INR 1.0 0.8 - 1.2    Comment: (NOTE) INR goal varies based on device and disease states. Performed at [facility], [address], [place], [place] [address]   APTT     Status: None   Collection Time: 07/18/23  7:35 PM  Result Value Ref Range   aPTT 31 24 - 36 seconds    Comment: Performed at [facility], [address], [place], [place] [address]  Brain natriuretic peptide     Status: Abnormal   Collection Time: 07/18/23  7:35 PM  Result Value Ref Range   B Natriuretic Peptide 126.3 (H) 0.0 - 100.0 pg/mL    Comment: Performed at [facility], [address], [place], [place] [address]  Lipase, blood     Status: None   Collection Time: 07/18/23  7:35 PM  Result Value Ref Range   Lipase 21 11 - 51 U/L    Comment: Performed at [facility], [address], [place], [place] [address]  Resp panel by RT-PCR (RSV, Flu A&B, Covid) Anterior Nasal Swab     Status: Abnormal   Collection Time: 07/18/23  9:16 PM   Specimen: Anterior Nasal Swab  Result Value Ref Range   SARS Coronavirus 2 by RT PCR POSITIVE (A) NEGATIVE   Influenza A by PCR NEGATIVE NEGATIVE   Influenza B by PCR NEGATIVE NEGATIVE    Comment: (NOTE) The Xpert Xpress SARS-CoV-2/FLU/RSV plus assay is intended as an aid in the diagnosis of influenza from Nasopharyngeal swab specimens and should not be used as a sole basis for treatment. Nasal washings and aspirates are unacceptable for Xpert Xpress SARS-CoV-2/FLU/RSV testing.  Fact Sheet for Patients: BloggerCourse.com  Fact Sheet for Healthcare Providers: SeriousBroker.it  This test is not yet approved or cleared by the [place] FDA and has been authorized for detection and/or diagnosis of SARS-CoV-2 by FDA under an Emergency Use Authorization (EUA). This EUA will remain in effect (meaning this test can be used) for the duration of the COVID-19 declaration under Section 564(b)(1) of the Act, 21 U.S.C. section 360bbb-3(b)(1), unless the authorization is terminated or revoked.     Resp Syncytial Virus by PCR NEGATIVE NEGATIVE    Comment: (NOTE) Fact Sheet for Patients: BloggerCourse.com  Fact Sheet for Healthcare  Providers: SeriousBroker.it  This test is not yet approved or cleared by the [place] FDA and has been authorized for detection and/or diagnosis of SARS-CoV-2 by FDA under an Emergency Use Authorization (EUA). This EUA will remain in effect (meaning this test can be used) for the duration of the COVID-19 declaration under Section 564(b)(1) of the Act, 21 U.S.C. section 360bbb-3(b)(1), unless the authorization is terminated or revoked.  Performed at [facility], [address] [place], [place] [address]   Urinalysis, w/ Reflex to Culture (Infection Suspected) -Urine, Catheterized; Indwelling urinary catheter     Status: Abnormal   Collection Time: 07/18/23  9:37 PM  Result Value Ref Range   Specimen Source URINE, CATHETERIZED    Color, Urine STRAW (A) YELLOW   APPearance HAZY (A) CLEAR   Specific Gravity, Urine 1.011 1.005 - 1.030   pH 7.0 5.0 - 8.0   Glucose, UA >=500 (A) NEGATIVE mg/dL   Hgb urine dipstick SMALL (A) NEGATIVE   Bilirubin Urine NEGATIVE NEGATIVE   Ketones, ur 5 (A) NEGATIVE mg/dL   Protein, ur NEGATIVE NEGATIVE mg/dL   Nitrite NEGATIVE NEGATIVE   Leukocytes,Ua LARGE (A) NEGATIVE   RBC / HPF 6-10 0 - 5 RBC/hpf   WBC, UA >50 0 - 5 WBC/hpf    Comment:        Reflex urine culture not performed if WBC <=10, OR if Squamous epithelial cells >5. If Squamous epithelial cells >5  suggest recollection.    Bacteria, UA NONE SEEN NONE SEEN   Squamous Epithelial / HPF 0-5 0 - 5 /HPF    Comment: Performed at [facility], [address] [place], [place] [address]   CT Head Wo Contrast  Result Date: 07/18/2023 CLINICAL DATA:  Altered mental status EXAM: CT HEAD WITHOUT CONTRAST CT CERVICAL SPINE WITHOUT CONTRAST TECHNIQUE: Multidetector CT imaging of the head and cervical spine was performed following the standard protocol without intravenous contrast. Multiplanar CT image reconstructions of the cervical spine were also  generated. RADIATION DOSE REDUCTION: This exam was performed according to the departmental dose-optimization program which includes automated exposure control, adjustment of the mA and/or kV according to patient size and/or use of iterative reconstruction technique. COMPARISON:  10/05/2022 CT head, no prior CT cervical spine, correlation is made with 04/10/2012 cervical spine radiographs FINDINGS: Evaluation of the head and cervical spine is limited by motion. CT HEAD FINDINGS Brain: No evidence of acute infarct, hemorrhage, mass, mass effect, or midline shift. No hydrocephalus or extra-axial fluid collection. Age related cerebral atrophy. Vascular: No hyperdense vessel. Atherosclerotic calcifications in the intracranial carotid and vertebral arteries. Skull: Negative for fracture or focal lesion. Sinuses/Orbits: Complete opacification of the right maxillary sinus. Mucosal thickening in the left maxillary sinus and bilateral ethmoid air cells. Status post bilateral lens replacements. Other: The mastoid air cells are well aerated. CT CERVICAL SPINE FINDINGS Alignment: No traumatic listhesis. Mild anterolisthesis of C2 on C3, C3 on C4, and C7 on T1 Skull base and vertebrae: No acute fracture or suspicious osseous lesion. Soft tissues and spinal canal: No prevertebral fluid or swelling. No visible canal hematoma. Disc levels: Degenerative changes in the cervical spine.Limited evaluation given the degree of motion. Upper chest: No focal pulmonary opacity or pleural effusion. IMPRESSION: Evaluation of the head and cervical spine is limited by motion. Within this limitation, no acute intracranial process. No acute fracture or traumatic listhesis in the cervical spine. Electronically Signed   By: Wiliam Ke M.D.   On: 07/18/2023 22:28   CT CERVICAL SPINE WO CONTRAST  Result Date: 07/18/2023 CLINICAL DATA:  Altered mental status EXAM: CT HEAD WITHOUT CONTRAST CT CERVICAL SPINE WITHOUT CONTRAST TECHNIQUE:  Multidetector CT imaging of the head and cervical spine was performed following the standard protocol without intravenous contrast. Multiplanar CT image reconstructions of the cervical spine were also generated. RADIATION DOSE REDUCTION: This exam was performed according to the departmental dose-optimization program which includes automated exposure control, adjustment of the mA and/or kV according to patient size and/or use of iterative reconstruction technique. COMPARISON:  10/05/2022 CT head, no prior CT cervical spine, correlation is made with 04/10/2012 cervical spine radiographs FINDINGS: Evaluation of the head and cervical spine is limited by motion. CT HEAD FINDINGS Brain: No evidence of acute infarct, hemorrhage, mass, mass effect, or midline shift. No hydrocephalus or extra-axial fluid collection. Age related cerebral atrophy. Vascular: No hyperdense vessel. Atherosclerotic calcifications in the intracranial carotid and vertebral arteries. Skull: Negative for fracture or focal lesion. Sinuses/Orbits: Complete opacification of the right maxillary sinus. Mucosal thickening in the left maxillary sinus and bilateral ethmoid air cells. Status post bilateral lens replacements. Other: The mastoid air cells are well aerated. CT CERVICAL SPINE FINDINGS Alignment: No traumatic listhesis. Mild anterolisthesis of C2 on C3, C3 on C4, and C7 on T1 Skull base and vertebrae: No acute fracture or suspicious osseous lesion. Soft tissues and spinal canal: No prevertebral fluid or swelling. No visible canal hematoma. Disc levels:  Degenerative changes in the cervical spine.Limited evaluation given the degree of motion. Upper chest: No focal pulmonary opacity or pleural effusion. IMPRESSION: Evaluation of the head and cervical spine is limited by motion. Within this limitation, no acute intracranial process. No acute fracture or traumatic listhesis in the cervical spine. Electronically Signed   By: Wiliam Ke M.D.   On:  07/18/2023 22:28   DG Chest Portable 1 View  Result Date: 07/18/2023 CLINICAL DATA:  AMS Reason for exam: ams, declining mental status; best attainable images due to pt condition and inability to following directions/hold still EXAM: PORTABLE CHEST 1 VIEW COMPARISON:  Chest x-ray 06/02/2021, CT chest 08/11/2023 FINDINGS: Slightly limited evaluation due to patient positioning. The heart and mediastinal contours are grossly unchanged. Atherosclerotic plaque No focal consolidation. No pulmonary edema. Blunting of the left costophrenic angle likely due to positioning. No pleural effusion. No pneumothorax. No acute osseous abnormality. IMPRESSION: No active disease. Slightly limited evaluation due to patient positioning. Electronically Signed   By: Tish Frederickson M.D.   On: 07/18/2023 22:23    Pending Labs Unresulted Labs (From admission, onward)     Start     Ordered   07/18/23 2137  Urine Culture  Once,   R        07/18/23 2137   07/18/23 1903  Blood Culture (routine x 2)  (Undifferentiated presentation (screening labs and basic nursing orders))  BLOOD CULTURE X 2,   STAT      07/18/23 1902            Vitals/Pain Today's Vitals   07/18/23 2100 07/18/23 2115 07/18/23 2144 07/18/23 2145  BP: (!) 163/85 (!) 126/112    Pulse: (!) 102 (!) 102  (!) 111  Resp: (!) 30 (!) 25  (!) 23  Temp:   (!) 101 F (38.3 C)   TempSrc:   Rectal   SpO2: 98% 90%  96%    Isolation Precautions No active isolations  Medications Medications  lactated ringers infusion ( Intravenous New Bag/Given 07/18/23 2002)  vancomycin (VANCOREADY) IVPB 1500 mg/300 mL (1,500 mg Intravenous New Bag/Given 07/18/23 2146)  lactated ringers bolus 1,000 mL (0 mLs Intravenous Stopped 07/18/23 2220)  metroNIDAZOLE (FLAGYL) IVPB 500 mg (0 mg Intravenous Stopped 07/18/23 2145)  ceFEPIme (MAXIPIME) 2 g in sodium chloride 0.9 % 100 mL IVPB (0 g Intravenous Stopped 07/18/23 2145)  acetaminophen (TYLENOL) suppository 650 mg (650  mg Rectal Given 07/18/23 2111)    Mobility walks with device     Focused Assessments Neuro Assessment Handoff:  Swallow screen pass?          Neuro Assessment:   Neuro Checks:      Has TPA been given? No If patient is a Neuro Trauma and patient is going to OR before floor call report to 4N Charge nurse: [phone] or [phone]   R Recommendations: See Admitting Provider Note  Report given to:   Additional Notes: Pt has only been orientated to self the whole time with [place]. Family is at bedside. Hand mits are recommended.

## 2023-07-18 NOTE — Telephone Encounter (Signed)
Marylene Land from [facility] reports that the pt fell out of bed last night, but didn't sustain any injury.   Pt's heart rate was at 120 today and he has had cold sxs for a few days.   [facility] plans to discharge the pt after today b/c the plan is to move to hospice services after he sees his oncologist.  Please call Marylene Land with any questions: [phone]

## 2023-07-18 NOTE — Telephone Encounter (Signed)
Please advise 

## 2023-07-18 NOTE — ED Triage Notes (Signed)
Pt BIB [facility] from home normally aox4 living with family, but is now aox2 per EMS. Pts RR in triage 36, SpO2 89% on RA. Chronic foley cath POA.

## 2023-07-19 ENCOUNTER — Inpatient Hospital Stay: Payer: Medicare Other

## 2023-07-19 ENCOUNTER — Inpatient Hospital Stay: Payer: Medicare Other | Admitting: Hematology

## 2023-07-19 DIAGNOSIS — M6259 Muscle wasting and atrophy, not elsewhere classified, multiple sites: Secondary | ICD-10-CM | POA: Diagnosis not present

## 2023-07-19 DIAGNOSIS — N400 Enlarged prostate without lower urinary tract symptoms: Secondary | ICD-10-CM | POA: Diagnosis present

## 2023-07-19 DIAGNOSIS — Z7984 Long term (current) use of oral hypoglycemic drugs: Secondary | ICD-10-CM | POA: Diagnosis not present

## 2023-07-19 DIAGNOSIS — U071 COVID-19: Secondary | ICD-10-CM

## 2023-07-19 DIAGNOSIS — N390 Urinary tract infection, site not specified: Secondary | ICD-10-CM

## 2023-07-19 DIAGNOSIS — C7951 Secondary malignant neoplasm of bone: Secondary | ICD-10-CM | POA: Diagnosis present

## 2023-07-19 DIAGNOSIS — R4182 Altered mental status, unspecified: Secondary | ICD-10-CM | POA: Diagnosis not present

## 2023-07-19 DIAGNOSIS — C61 Malignant neoplasm of prostate: Secondary | ICD-10-CM | POA: Diagnosis present

## 2023-07-19 DIAGNOSIS — G301 Alzheimer's disease with late onset: Secondary | ICD-10-CM | POA: Diagnosis present

## 2023-07-19 DIAGNOSIS — R339 Retention of urine, unspecified: Secondary | ICD-10-CM

## 2023-07-19 DIAGNOSIS — J9601 Acute respiratory failure with hypoxia: Secondary | ICD-10-CM

## 2023-07-19 DIAGNOSIS — F0390 Unspecified dementia without behavioral disturbance: Secondary | ICD-10-CM | POA: Diagnosis not present

## 2023-07-19 DIAGNOSIS — A4189 Other specified sepsis: Secondary | ICD-10-CM | POA: Diagnosis present

## 2023-07-19 DIAGNOSIS — I5042 Chronic combined systolic (congestive) and diastolic (congestive) heart failure: Secondary | ICD-10-CM | POA: Diagnosis present

## 2023-07-19 DIAGNOSIS — E860 Dehydration: Secondary | ICD-10-CM | POA: Diagnosis present

## 2023-07-19 DIAGNOSIS — E1165 Type 2 diabetes mellitus with hyperglycemia: Secondary | ICD-10-CM | POA: Diagnosis present

## 2023-07-19 DIAGNOSIS — A419 Sepsis, unspecified organism: Secondary | ICD-10-CM | POA: Diagnosis present

## 2023-07-19 DIAGNOSIS — Z7401 Bed confinement status: Secondary | ICD-10-CM | POA: Diagnosis not present

## 2023-07-19 DIAGNOSIS — E119 Type 2 diabetes mellitus without complications: Secondary | ICD-10-CM | POA: Diagnosis not present

## 2023-07-19 DIAGNOSIS — R296 Repeated falls: Secondary | ICD-10-CM | POA: Diagnosis present

## 2023-07-19 DIAGNOSIS — M6281 Muscle weakness (generalized): Secondary | ICD-10-CM | POA: Diagnosis not present

## 2023-07-19 DIAGNOSIS — N179 Acute kidney failure, unspecified: Secondary | ICD-10-CM

## 2023-07-19 DIAGNOSIS — Z66 Do not resuscitate: Secondary | ICD-10-CM | POA: Diagnosis present

## 2023-07-19 DIAGNOSIS — N401 Enlarged prostate with lower urinary tract symptoms: Secondary | ICD-10-CM | POA: Diagnosis not present

## 2023-07-19 DIAGNOSIS — E785 Hyperlipidemia, unspecified: Secondary | ICD-10-CM | POA: Diagnosis present

## 2023-07-19 DIAGNOSIS — F0280 Dementia in other diseases classified elsewhere without behavioral disturbance: Secondary | ICD-10-CM | POA: Diagnosis present

## 2023-07-19 DIAGNOSIS — Z794 Long term (current) use of insulin: Secondary | ICD-10-CM | POA: Diagnosis not present

## 2023-07-19 DIAGNOSIS — E1122 Type 2 diabetes mellitus with diabetic chronic kidney disease: Secondary | ICD-10-CM | POA: Diagnosis present

## 2023-07-19 DIAGNOSIS — I130 Hypertensive heart and chronic kidney disease with heart failure and stage 1 through stage 4 chronic kidney disease, or unspecified chronic kidney disease: Secondary | ICD-10-CM | POA: Diagnosis present

## 2023-07-19 DIAGNOSIS — N3000 Acute cystitis without hematuria: Secondary | ICD-10-CM | POA: Diagnosis not present

## 2023-07-19 DIAGNOSIS — Z741 Need for assistance with personal care: Secondary | ICD-10-CM | POA: Diagnosis not present

## 2023-07-19 DIAGNOSIS — E782 Mixed hyperlipidemia: Secondary | ICD-10-CM

## 2023-07-19 DIAGNOSIS — R6520 Severe sepsis without septic shock: Secondary | ICD-10-CM | POA: Diagnosis not present

## 2023-07-19 DIAGNOSIS — F05 Delirium due to known physiological condition: Secondary | ICD-10-CM | POA: Diagnosis not present

## 2023-07-19 DIAGNOSIS — Z85820 Personal history of malignant melanoma of skin: Secondary | ICD-10-CM | POA: Diagnosis not present

## 2023-07-19 DIAGNOSIS — I10 Essential (primary) hypertension: Secondary | ICD-10-CM | POA: Diagnosis not present

## 2023-07-19 DIAGNOSIS — I452 Bifascicular block: Secondary | ICD-10-CM | POA: Diagnosis present

## 2023-07-19 DIAGNOSIS — J4489 Other specified chronic obstructive pulmonary disease: Secondary | ICD-10-CM | POA: Diagnosis present

## 2023-07-19 DIAGNOSIS — N1831 Chronic kidney disease, stage 3a: Secondary | ICD-10-CM | POA: Diagnosis present

## 2023-07-19 LAB — CBC
HCT: 38.7 % — ABNORMAL LOW (ref 39.0–52.0)
Hemoglobin: 12.3 g/dL — ABNORMAL LOW (ref 13.0–17.0)
MCH: 29 pg (ref 26.0–34.0)
MCHC: 31.8 g/dL (ref 30.0–36.0)
MCV: 91.3 fL (ref 80.0–100.0)
Platelets: 190 10*3/uL (ref 150–400)
RBC: 4.24 MIL/uL (ref 4.22–5.81)
RDW: 14.4 % (ref 11.5–15.5)
WBC: 11.6 10*3/uL — ABNORMAL HIGH (ref 4.0–10.5)
nRBC: 0 % (ref 0.0–0.2)

## 2023-07-19 LAB — BASIC METABOLIC PANEL
Anion gap: 10 (ref 5–15)
BUN: 13 mg/dL (ref 8–23)
CO2: 27 mmol/L (ref 22–32)
Calcium: 8.5 mg/dL — ABNORMAL LOW (ref 8.9–10.3)
Chloride: 103 mmol/L (ref 98–111)
Creatinine, Ser: 1.2 mg/dL (ref 0.61–1.24)
GFR, Estimated: 56 mL/min — ABNORMAL LOW (ref >60)
Glucose, Bld: 220 mg/dL — ABNORMAL HIGH (ref 70–99)
Potassium: 3.4 mmol/L — ABNORMAL LOW (ref 3.5–5.1)
Sodium: 140 mmol/L (ref 135–145)

## 2023-07-19 LAB — GLUCOSE, CAPILLARY
Glucose-Capillary: 203 mg/dL — ABNORMAL HIGH (ref 70–99)
Glucose-Capillary: 224 mg/dL — ABNORMAL HIGH (ref 70–99)
Glucose-Capillary: 97 mg/dL (ref 70–99)
Glucose-Capillary: 124 mg/dL — ABNORMAL HIGH (ref 70–99)

## 2023-07-19 MED ORDER — REMDESIVIR 100 MG IV SOLR
100.0000 mg | Freq: Every day | INTRAVENOUS | Status: AC
  Administered 2023-07-20 – 2023-07-21 (×2): 100 mg via INTRAVENOUS
  Filled 2023-07-19 (×2): qty 20

## 2023-07-19 MED ORDER — MIRABEGRON ER 25 MG PO TB24
25.0000 mg | ORAL_TABLET | Freq: Every day | ORAL | Status: DC
  Administered 2023-07-19 – 2023-07-22 (×4): 25 mg via ORAL
  Filled 2023-07-19 (×4): qty 1

## 2023-07-19 MED ORDER — COLESEVELAM HCL 625 MG PO TABS
625.0000 mg | ORAL_TABLET | Freq: Every day | ORAL | Status: DC
  Administered 2023-07-19 – 2023-07-22 (×4): 625 mg via ORAL
  Filled 2023-07-19 (×4): qty 1

## 2023-07-19 MED ORDER — VITAMIN B-12 1000 MCG PO TABS
500.0000 ug | ORAL_TABLET | Freq: Every day | ORAL | Status: DC
  Administered 2023-07-19 – 2023-07-22 (×4): 500 ug via ORAL
  Filled 2023-07-19 (×4): qty 1

## 2023-07-19 MED ORDER — MAGNESIUM GLUCONATE 500 MG PO TABS
500.0000 mg | ORAL_TABLET | Freq: Every day | ORAL | Status: DC
  Administered 2023-07-19 – 2023-07-21 (×4): 500 mg via ORAL
  Filled 2023-07-19 (×5): qty 1

## 2023-07-19 MED ORDER — MELATONIN 3 MG PO TABS
3.0000 mg | ORAL_TABLET | Freq: Every evening | ORAL | Status: DC | PRN
  Administered 2023-07-19 – 2023-07-20 (×2): 3 mg via ORAL
  Filled 2023-07-19 (×3): qty 1

## 2023-07-19 MED ORDER — INSULIN ASPART 100 UNIT/ML IJ SOLN
0.0000 [IU] | Freq: Three times a day (TID) | INTRAMUSCULAR | Status: DC
  Administered 2023-07-19 (×2): 7 [IU] via SUBCUTANEOUS
  Administered 2023-07-20: 11 [IU] via SUBCUTANEOUS
  Administered 2023-07-20: 3 [IU] via SUBCUTANEOUS
  Administered 2023-07-20: 4 [IU] via SUBCUTANEOUS
  Administered 2023-07-20: 11 [IU] via SUBCUTANEOUS
  Administered 2023-07-21 (×3): 4 [IU] via SUBCUTANEOUS
  Administered 2023-07-22: 3 [IU] via SUBCUTANEOUS

## 2023-07-19 MED ORDER — POTASSIUM CHLORIDE CRYS ER 20 MEQ PO TBCR
40.0000 meq | EXTENDED_RELEASE_TABLET | Freq: Once | ORAL | Status: AC
  Administered 2023-07-19: 40 meq via ORAL
  Filled 2023-07-19: qty 2

## 2023-07-19 MED ORDER — ENZALUTAMIDE 80 MG PO TABS
80.0000 mg | ORAL_TABLET | Freq: Every day | ORAL | Status: DC
Start: 2023-07-19 — End: 2023-07-22
  Administered 2023-07-19 – 2023-07-21 (×3): 80 mg via ORAL
  Filled 2023-07-19 (×5): qty 1

## 2023-07-19 MED ORDER — METOPROLOL TARTRATE 12.5 MG HALF TABLET
12.5000 mg | ORAL_TABLET | Freq: Two times a day (BID) | ORAL | Status: DC
  Administered 2023-07-19 – 2023-07-22 (×8): 12.5 mg via ORAL
  Filled 2023-07-19 (×8): qty 1

## 2023-07-19 MED ORDER — SODIUM CHLORIDE 0.9 % IV SOLN
200.0000 mg | Freq: Once | INTRAVENOUS | Status: AC
  Administered 2023-07-19: 200 mg via INTRAVENOUS
  Filled 2023-07-19: qty 40

## 2023-07-19 MED ORDER — INSULIN GLARGINE-YFGN 100 UNIT/ML ~~LOC~~ SOLN
20.0000 [IU] | Freq: Every day | SUBCUTANEOUS | Status: DC
  Administered 2023-07-19 – 2023-07-20 (×2): 20 [IU] via SUBCUTANEOUS
  Filled 2023-07-19 (×3): qty 0.2

## 2023-07-19 MED ORDER — TAMSULOSIN HCL 0.4 MG PO CAPS
0.4000 mg | ORAL_CAPSULE | Freq: Every day | ORAL | Status: DC
  Administered 2023-07-19 – 2023-07-22 (×4): 0.4 mg via ORAL
  Filled 2023-07-19 (×4): qty 1

## 2023-07-19 MED ORDER — VITAMIN D 25 MCG (1000 UNIT) PO TABS
1000.0000 [IU] | ORAL_TABLET | Freq: Every evening | ORAL | Status: DC
  Administered 2023-07-19 – 2023-07-21 (×3): 1000 [IU] via ORAL
  Filled 2023-07-19 (×3): qty 1

## 2023-07-19 MED ORDER — ENOXAPARIN SODIUM 40 MG/0.4ML IJ SOSY
40.0000 mg | PREFILLED_SYRINGE | INTRAMUSCULAR | Status: DC
  Administered 2023-07-19 – 2023-07-22 (×4): 40 mg via SUBCUTANEOUS
  Filled 2023-07-19 (×4): qty 0.4

## 2023-07-19 MED ORDER — SODIUM CHLORIDE 0.9 % IV SOLN
2.0000 g | Freq: Two times a day (BID) | INTRAVENOUS | Status: DC
  Administered 2023-07-19 (×2): 2 g via INTRAVENOUS
  Filled 2023-07-19 (×3): qty 12.5

## 2023-07-19 MED ORDER — ACETAMINOPHEN 325 MG PO TABS: 650.0000 mg | ORAL_TABLET | Freq: Four times a day (QID) | ORAL | Status: DC | PRN

## 2023-07-19 NOTE — Plan of Care (Signed)
  Problem: Education: Goal: Knowledge of risk factors and measures for prevention of condition will improve Outcome: Progressing   Problem: Coping: Goal: Psychosocial and spiritual needs will be supported Outcome: Progressing   Problem: Respiratory: Goal: Will maintain a patent airway Outcome: Progressing Goal: Complications related to the disease process, condition or treatment will be avoided or minimized Outcome: Progressing   Problem: Education: Goal: Ability to describe self-care measures that may prevent or decrease complications (Diabetes Survival Skills Education) will improve Outcome: Progressing Goal: Individualized Educational Video(s) Outcome: Progressing   Problem: Coping: Goal: Ability to adjust to condition or change in health will improve Outcome: Progressing   Problem: Fluid Volume: Goal: Ability to maintain a balanced intake and output will improve Outcome: Progressing   Problem: Health Behavior/Discharge Planning: Goal: Ability to identify and utilize available resources and services will improve Outcome: Progressing Goal: Ability to manage health-related needs will improve Outcome: Progressing   Problem: Metabolic: Goal: Ability to maintain appropriate glucose levels will improve Outcome: Progressing   Problem: Nutritional: Goal: Maintenance of adequate nutrition will improve Outcome: Progressing Goal: Progress toward achieving an optimal weight will improve Outcome: Progressing   Problem: Skin Integrity: Goal: Risk for impaired skin integrity will decrease Outcome: Progressing   Problem: Tissue Perfusion: Goal: Adequacy of tissue perfusion will improve Outcome: Progressing   Problem: Education: Goal: Knowledge of General Education information will improve Description: Including pain rating scale, medication(s)/side effects and non-pharmacologic comfort measures Outcome: Progressing   Problem: Health Behavior/Discharge Planning: Goal:  Ability to manage health-related needs will improve Outcome: Progressing   Problem: Clinical Measurements: Goal: Ability to maintain clinical measurements within normal limits will improve Outcome: Progressing Goal: Will remain free from infection Outcome: Progressing Goal: Diagnostic test results will improve Outcome: Progressing Goal: Respiratory complications will improve Outcome: Progressing Goal: Cardiovascular complication will be avoided Outcome: Progressing   Problem: Activity: Goal: Risk for activity intolerance will decrease Outcome: Progressing   Problem: Nutrition: Goal: Adequate nutrition will be maintained Outcome: Progressing   Problem: Coping: Goal: Level of anxiety will decrease Outcome: Progressing   Problem: Elimination: Goal: Will not experience complications related to bowel motility Outcome: Progressing Goal: Will not experience complications related to urinary retention Outcome: Progressing   Problem: Pain Management: Goal: General experience of comfort will improve Outcome: Progressing   Problem: Safety: Goal: Ability to remain free from injury will improve Outcome: Progressing

## 2023-07-19 NOTE — Assessment & Plan Note (Signed)
-   Likely secondary to dehydration and sepsis.  Has chronic indwelling Foley catheter. - Creatinine elevated 1.54 - Keep on continuous IV fluid follow creatinine in the morning

## 2023-07-19 NOTE — Assessment & Plan Note (Addendum)
-   Secondary to COVID in addition to possible UTI -Presented with fever, tachycardia and leukocytosis - Will start remdesivir - Will continue cefepime pending urine culture.  Patient has history of Klebsiella UTI. -Keep on continuous IV fluid overnight

## 2023-07-19 NOTE — Assessment & Plan Note (Signed)
-  follows with Dr. Candise Che -on daily Xtandi and q3 month Leuprolid injections

## 2023-07-19 NOTE — H&P (Signed)
History and Physical    Patient: Nicholas Burton ZOX:[id number] DOB: [birth year] DOA: 07/18/2023 DOS: the patient was seen and examined on 07/19/2023 PCP: Etta Grandchild, MD  Patient coming from: Home  Chief Complaint:  Chief Complaint  Patient presents with   Altered Mental Status   HPI: Nicholas Burton is a 89 y.o. male with medical history significant of dementia, prostate cancer with metastasis to bone, combined systolic and diastolic heart failure, COPD, asthma, insulin-dependent type 2 diabetes, hyperlipidemia, chronic urinary retention with indwelling catheter who presents with altered mental status.  Patient unable to provide history as he appears disorientated and is very hard of hearing.  History obtained from son at bedside and with documentation..  Patient lives at home with his wife and reportedly last night had a fall somewhere between his bed and ambulating to the bathroom.  This morning patient appeared more confused and was putting toothpaste on hand soap on his hair.  Also felt like he was less responsive.  Son also recalls this past weekend he had more labored respiration and cough.  On arrival to the ED he was afebrile, tachycardic with heart rate up to 111, hypotensive with BP up to 160/80 and was noted to be hypoxic down to 88% on presentation and placed on 4 L.  CBC with leukocytosis of 16.7, hemoglobin 12.7. CMP notable for AKI with creatinine of 1.54 and hyperglycemia with glucose of 384 and otherwise normal CO2 and anion gap.  UA from indwelling Foley catheter with large leukocyte, negative nitrite, greater than 50 WBC and no bacteria  CT head and cervical spine limited by motion but no obvious acute intracranial process or fractures.  Chest x-ray is negative  EKG with sinus tachycardia, RBBB and LAFB  Patient was treated empirically for sepsis with IV vancomycin, cefepime and Flagyl.  COVID subsequently returned positive.  Hospitalist then consulted for  admission.  Review of Systems: unable to review all systems due to the inability of the patient to answer questions. Past Medical History:  Diagnosis Date   Allergy    ANEMIA ASSOCIATED W/OTHER [facility] NUTRITIONAL DEFIC 08/11/2010   Qualifier: Diagnosis of  By: Yetta Barre MD, Bernadene Bell.    Cancer [facility])    skin   Cataract    Chronic combined systolic and diastolic congestive heart failure (HCC) 10/30/2018   COPD with asthma (HCC) 03/02/2017   CRI (chronic renal insufficiency), stage 3 (moderate) (HCC) 10/25/2018   Deficiency anemia 10/25/2018   Diabetes mellitus    type 2   DOE (dyspnea on exertion) 09/03/2018   Edema 06/11/2009   Qualifier: Diagnosis of  By: Yetta Barre MD, Bernadene Bell.    History of skin cancer    Hyperlipidemia with target LDL less than 100 11/10/2008        Hypertension    Memory loss 05/14/2009   Obesity (BMI 30.0-34.9) 04/01/2013   Osteoarthritis    PSA elevation 10/25/2018   SKIN CANCER, HX OF 11/10/2008   Qualifier: Diagnosis of  By: Yetta Barre MD, Bernadene Bell.    Snoring 04/03/2014   Type II diabetes mellitus with manifestations (HCC) 11/10/2008   Estimated Creatinine Clearance: 38 mL/min (A) (by C-G formula based on SCr of 1.52 mg/dL (H)).    Venous stasis dermatitis of both lower extremities 01/29/2018   Vitamin D deficiency 11/10/2008   Past Surgical History:  Procedure Laterality Date   CHOLECYSTECTOMY     INGUINAL HERNIA REPAIR     x 3   IR FLUORO GUIDED NEEDLE  PLC ASPIRATION/INJECTION LOC  10/07/2022   JOINT REPLACEMENT     KNEE SURGERY     x 2   MELANOMA EXCISION     x 3 -- Left arm   TOTAL KNEE ARTHROPLASTY     x 3   Social History:  reports that he has never smoked. He has never used smokeless tobacco. He reports that he does not drink alcohol and does not use drugs.  Allergies  Allergen Reactions   Ace Inhibitors Cough   Oxycodone Itching and Rash   Penicillins Itching and Rash    Family History  Problem Relation Age of Onset   Heart disease Mother    Diabetes Mother     Heart disease Father    Cancer Sister    Cancer Brother    Diabetes Son    Diabetes Other     Prior to Admission medications   Medication Sig Start Date End Date Taking? Authorizing Provider  acetaminophen (TYLENOL) 325 MG tablet Take 1-2 tablets (325-650 mg total) by mouth every 6 (six) hours as needed for mild pain. 10/21/22  Yes Angiulli, Mcarthur Rossetti, PA-C  cholecalciferol (VITAMIN D) 1000 units tablet Take 1 tablet (1,000 Units total) by mouth daily. Patient taking differently: Take 1,000 Units by mouth every evening. 10/21/22  Yes Angiulli, Mcarthur Rossetti, PA-C  Colesevelam HCl 3.75 g PACK Take 1 packet by mouth daily. 02/06/23  Yes Corky Crafts, MD  cyanocobalamin (VITAMIN B12) 500 MCG tablet Take 1 tablet (500 mcg total) by mouth daily. 10/21/22  Yes Angiulli, Mcarthur Rossetti, PA-C  dexamethasone (DECADRON) 4 MG tablet Take 1 tablet (4 mg total) by mouth daily. 02/20/23  Yes Etta Grandchild, MD  empagliflozin (JARDIANCE) 10 MG TABS tablet Take 1 tablet (10 mg total) by mouth daily before breakfast. 07/05/23  Yes Etta Grandchild, MD  LANTUS SOLOSTAR 100 UNIT/ML Solostar Pen Inject 50 Units into the skin daily. Patient taking differently: Inject 50 Units into the skin at bedtime. 06/23/23  Yes Etta Grandchild, MD  magnesium gluconate (MAGONATE) 500 MG tablet Take 1 tablet (500 mg total) by mouth at bedtime. 10/21/22  Yes Angiulli, Mcarthur Rossetti, PA-C  melatonin 3 MG TABS tablet Take 1 tablet (3 mg total) by mouth at bedtime as needed. 10/21/22  Yes Angiulli, Mcarthur Rossetti, PA-C  metoprolol tartrate (LOPRESSOR) 25 MG tablet TAKE ONE-HALF (1/2) TABLET TWICE A DAY 01/25/23  Yes Corky Crafts, MD  Multiple Vitamins-Minerals (PRESERVISION/LUTEIN) CAPS Take 1 capsule by mouth 2 (two) times daily.   Yes [provider]  tamsulosin (FLOMAX) 0.4 MG CAPS capsule Take 1 capsule (0.4 mg total) by mouth daily. 02/20/23  Yes Etta Grandchild, MD  torsemide (DEMADEX) 10 MG tablet Take 1 tablet (10 mg total) by  mouth every other day. 02/06/23  Yes Corky Crafts, MD  Vibegron [facility]) 75 MG TABS TAKE 1 TABLET DAILY 06/05/23  Yes Etta Grandchild, MD  XTANDI 80 MG tablet TAKE 1 TABLET DAILY 06/23/23  Yes Johney Maine, MD  atorvastatin (LIPITOR) 10 MG tablet Take 1 tablet (10 mg total) by mouth daily. Pt needs to keep upcoming appt in Oct for further refills Patient not taking: Reported on 07/18/2023 01/31/23   Corky Crafts, MD  Insulin Pen Needle (NOVOFINE) 30G X 8 MM MISC Inject 10 each into the skin as needed. 02/20/23   Etta Grandchild, MD    Physical Exam: Vitals:   07/18/23 2245 07/18/23 2300 07/18/23 2335 07/19/23 0015  BP:  (!) 151/76 (!) 155/82   Pulse: (!) 106 (!) 104 (!) 101 93  Resp: (!) 26 (!) 22 13 19   Temp:      TempSrc:      SpO2: 95% 97% 96% 94%   Constitutional: NAD, calm, comfortable, elderly chronically ill-appearing male lying in bed initially asleep.  Awoke to voice and light touch but very hard of hearing  Eyes:  lids and conjunctivae normal ENMT: Mucous membranes are moist.  Neck: normal, supple Respiratory: clear to auscultation bilaterally, no wheezing, no crackles. Normal respiratory effort. No accessory muscle use.  Cardiovascular: Regular rate and rhythm, no murmurs / rubs / gallops.  +3 pitting edema bilateral lower extremity up to mid pretibial region  abdomen: no tenderness, soft Musculoskeletal: no clubbing / cyanosis. No joint deformity upper and lower extremities. Normal muscle tone.  Skin: no rashes, lesions, ulcers. No induration Neurologic: CN 2-12 grossly intact.   Psychiatric: appears disoriented but unable to fully assess orientation due to hearing impairment Data Reviewed:  See HPI  Assessment and Plan: * Sepsis (HCC) - Secondary to COVID in addition to possible UTI -Presented with fever, tachycardia and leukocytosis - Will start remdesivir - Will continue cefepime pending urine culture.  Patient has history of Klebsiella  UTI. -Keep on continuous IV fluid overnight  Acute on chronic urinary retention -with hx of BPH - Currently on cefepime awaiting urine culture - Foley catheter exchanged in ED - [facility] -Continue tamsulosin  Acute hypoxic respiratory failure (HCC) - Secondary to COVID-19 viral infection placed on 2 L via nasal cannula - Start IV remdesivir - Will hold on steroids at this time pending urine culture as it appears that he may also have a UTI  AKI (acute kidney injury) (HCC) - Likely secondary to dehydration and sepsis.  Has chronic indwelling Foley catheter. - Creatinine elevated 1.54 - Keep on continuous IV fluid follow creatinine in the morning  Insulin dependent type 2 diabetes mellitus (HCC) - Uncontrolled with last hemoglobin of 7.9 - At home requiring 45 to 50 units of Lantus - Placed on resistant SSI  Prostate cancer metastatic to bone [facility]) -follows with Dr. Candise Che -on daily Xtandi and q3 month Leuprolid injections  Late onset Alzheimer's dementia without behavioral disturbance (HCC) - At baseline patient is alert and oriented to self, family members and place.  Unable to assess orientation as patient is very hard of hearing although he appears disoriented.  Essential hypertension - Controlled not on antihypertensives  HLD (hyperlipidemia) - Recently discontinued off statin due to potential interaction with Xtandi -continue colesevelam      Advance Care Planning: DNR  Consults: None  Family Communication: Son at bedside  Severity of Illness: The appropriate patient status for this patient is INPATIENT. Inpatient status is judged to be reasonable and necessary in order to provide the required intensity of service to ensure the patient's safety. The patient's presenting symptoms, physical exam findings, and initial radiographic and laboratory data in the context of their chronic comorbidities is felt to place them at high risk  for further clinical deterioration. Furthermore, it is not anticipated that the patient will be medically stable for discharge from the hospital within 2 midnights of admission.   * I certify that at the point of admission it is my clinical judgment that the patient will require inpatient hospital care spanning beyond 2 midnights from the point of admission due to high intensity of service, high risk for further deterioration  and high frequency of surveillance required.*  Author: Anselm Jungling, DO 07/19/2023 1:00 AM  For on call review [email or web address].

## 2023-07-19 NOTE — TOC Progression Note (Addendum)
Transition of Care [facility]) - Progression Note    Patient Details  Name: Nicholas Burton MRN: [id number] Date of Birth: 1927-10-31  Transition of Care [facility]) CM/SW Contact  Stephan Draughn A [place], [place] Phone Number: 07/19/2023, 2:21 PM  Clinical Narrative:     Update 11/21 1128 CSW contacted pt's son, [name] and provided bed offers with Medicare ratings. CSW informed him that [facility] has a private room available Friday or over the weekend. He said he would discuss the options with pt and family and update CSW with decision by today or early tomorrow. CSW provided contact information to reach out to CSW.   CSW was informed via secure chat message that pt's son and pt's family are interested in pt to going to SNF. CSW sent out referral for SNF. Pt is Covid +, typical SNF policy 10 days quarantine before able to be discharged. CSW to follow up with specific facility once placement choice has been decided. Bed offers pending.    TOC will continue to follow.        Expected Discharge Plan and Services                                               Social Determinants of Health (SDOH) Interventions SDOH Screenings   Food Insecurity: No Food Insecurity (07/19/2023)  Housing: Low Risk  (07/19/2023)  Transportation Needs: No Transportation Needs (07/19/2023)  Utilities: Not At Risk (07/19/2023)  Alcohol Screen: Low Risk  (07/13/2023)  Depression (PHQ2-9): Low Risk  (10/27/2022)  Financial Resource Strain: Low Risk  (07/13/2023)  Physical Activity: Sufficiently Active (07/13/2023)  Social Connections: Moderately Isolated (07/13/2023)  Stress: Patient Unable To Answer (07/13/2023)  Tobacco Use: Low Risk  (07/18/2023)  Health Literacy: Inadequate Health Literacy (07/13/2023)    Readmission Risk Interventions     No data to display

## 2023-07-19 NOTE — Assessment & Plan Note (Addendum)
-  with hx of BPH - Currently on cefepime awaiting urine culture - Foley catheter exchanged in ED - [facility] -Continue tamsulosin

## 2023-07-19 NOTE — Assessment & Plan Note (Signed)
Uncontrolled SBP. Patient is not on antihypertensive therapy as an outpatient. -Hydralazine PRN

## 2023-07-19 NOTE — Progress Notes (Signed)
[facility] [address] [facility] Liaison Note:  This is a current palliative patient with [employer] who had been receiving home health services through our [facility]. He however discharged home health on the Nov. 19th with plan for transition to hospice.  Liaison will continue to follow for discharge disposition. Please call with any Integrated Health Services related questions or concerns.  Thank you, Thea Gist, BSN [facility] liaison [phone]

## 2023-07-19 NOTE — Assessment & Plan Note (Signed)
-   Recently discontinued off statin due to potential interaction with Xtandi -continue colesevelam

## 2023-07-19 NOTE — Evaluation (Signed)
Occupational Therapy Evaluation Patient Details Name: Nicholas Burton MRN: [id number] DOB: [birth year] Today's Date: 07/19/2023   History of Present Illness 89 y.o. male presents to ED 11/19 with altered mental status. Admitted for treatment of sepsis secondary to COVID and possible UTI ION:[id number], prostate cancer with metastasis to bone, combined systolic and diastolic heart failure, COPD, asthma, insulin-dependent type 2 diabetes, hyperlipidemia, chronic urinary retention with indwelling catheter   Clinical Impression   Pt reports using RW for mobility but has not been "moving much" per son, has assist for ADLs (aide comes 4-8pm daily but family looking to incr time length from 12-8pm). Pt currently needing set up - max A for ADLs, supervision for bed mobility and min A +2 for transfers using RW. Pt very HOH (hears from L ear better), needing frequent cues for safety and sequencing during session. Pt presenting with impairments listed below, will follow acutely. Patient will benefit from continued inpatient follow up therapy, <3 hours/day to maximize safety/ind with ADLs/functional mobility.        If plan is discharge home, recommend the following: A little help with walking and/or transfers;A lot of help with bathing/dressing/bathroom;Assistance with cooking/housework;Direct supervision/assist for medications management;Direct supervision/assist for financial management;Assist for transportation;Help with stairs or ramp for entrance    Functional Status Assessment  Patient has had a recent decline in their functional status and demonstrates the ability to make significant improvements in function in a reasonable and predictable amount of time.  Equipment Recommendations  Other (comment) (defer)    Recommendations for Other Services PT consult     Precautions / Restrictions Precautions Precautions: Fall Precaution Comments: several in the last 8 weeks, usually ends up sitting  back Restrictions Weight Bearing Restrictions: No      Mobility Bed Mobility Overal bed mobility: Needs Assistance Bed Mobility: Supine to Sit     Supine to sit: HOB elevated, Used rails, Supervision     General bed mobility comments: with high visual cues pt able to come to EoB with supervision.    Transfers Overall transfer level: Needs assistance Equipment used: Rolling walker (2 wheels) Transfers: Sit to/from Stand Sit to Stand: Min assist, +2 safety/equipment                  Balance Overall balance assessment: Needs assistance Sitting-balance support: Feet unsupported, Bilateral upper extremity supported, No upper extremity supported Sitting balance-Leahy Scale: Good     Standing balance support: Bilateral upper extremity supported, During functional activity, Reliant on assistive device for balance Standing balance-Leahy Scale: Fair Standing balance comment: requires UE support for dynamic balance                           ADL either performed or assessed with clinical judgement   ADL Overall ADL's : Needs assistance/impaired Eating/Feeding: Set up   Grooming: Wash/dry face;Sitting   Upper Body Bathing: Moderate assistance;Sitting   Lower Body Bathing: Maximal assistance;Sitting/lateral leans   Upper Body Dressing : Moderate assistance;Sitting   Lower Body Dressing: Maximal assistance;Sitting/lateral leans   Toilet Transfer: Minimal assistance;+2 for safety/equipment           Functional mobility during ADLs: Minimal assistance;+2 for physical assistance;Rolling walker (2 wheels)       Vision   Additional Comments: per son reports pt has "poor vision", noted PMH of cataracts, appears functional for BADL     Perception         Praxis  Pertinent Vitals/Pain Pain Assessment Pain Assessment: No/denies pain     Extremity/Trunk Assessment Upper Extremity Assessment Upper Extremity Assessment: Generalized weakness    Lower Extremity Assessment Lower Extremity Assessment: Defer to PT evaluation   Cervical / Trunk Assessment Cervical / Trunk Assessment: Kyphotic   Communication Communication Communication: Hearing impairment Cueing Techniques: Verbal cues;Gestural cues;Tactile cues;Visual cues;Other (comments) (has hearing aides in place)   Cognition Arousal: Alert Behavior During Therapy: WFL for tasks assessed/performed, Flat affect Overall Cognitive Status: Difficult to assess                                 General Comments: pt is A&Ox4 but is very HoH Has difficulty with command follow likely to be a combination of HoH and decreased cognition     General Comments  VSS on RA, son present and providing much of home set up info    Exercises     Shoulder Instructions      Home Living Family/patient expects to be discharged to:: Private residence Living Arrangements: Spouse/significant other;Other (Comment) (caregiver 4-8pm currently, but family wanting to incr) Available Help at Discharge: Family;Available 24 hours/day Type of Home: House Home Access: Stairs to enter [employer] of Steps: 2   Home Layout: One level     Bathroom Shower/Tub: Tub/shower unit         Home Equipment: Lift chair;Shower Counsellor (2 wheels)          Prior Functioning/Environment Prior Level of Function : History of Falls (last six months)             Mobility Comments: Using RW for mobility tasks, not moving as much as has been ADLs Comments: needing assist with bADLs and iADLs, HHAide 4-8pm daily, family working on 12pm-8pm Aide        OT Problem List: Decreased strength;Decreased range of motion;Decreased activity tolerance;Impaired balance (sitting and/or standing);Impaired vision/perception;Decreased cognition      OT Treatment/Interventions: Self-care/ADL training;Therapeutic exercise;Energy conservation;DME and/or AE instruction;Therapeutic  activities;Patient/family education;Balance training;Visual/perceptual remediation/compensation;Cognitive remediation/compensation    OT Goals(Current goals can be found in the care plan section) Acute Rehab OT Goals Patient Stated Goal: none stated OT Goal Formulation: With patient Time For Goal Achievement: 08/02/23 Potential to Achieve Goals: Good ADL Goals Pt Will Perform Upper Body Dressing: with supervision;sitting Pt Will Perform Lower Body Dressing: with supervision;sit to/from stand;sitting/lateral leans Pt Will Transfer to Toilet: with supervision;ambulating;regular height toilet Pt Will Perform Tub/Shower Transfer: Tub transfer;Shower transfer;with supervision;ambulating;shower seat Additional ADL Goal #1: pt will verbalize x3 fall prevention strategies in order to promote safety with ADLs/functional mobility  OT Frequency: Min 1X/week    Co-evaluation              AM-PAC OT "6 Clicks" Daily Activity     Outcome Measure Help from another person eating meals?: A Little Help from another person taking care of personal grooming?: A Little Help from another person toileting, which includes using toliet, bedpan, or urinal?: A Lot Help from another person bathing (including washing, rinsing, drying)?: A Lot Help from another person to put on and taking off regular upper body clothing?: A Little Help from another person to put on and taking off regular lower body clothing?: A Lot 6 Click Score: 15   End of Session Equipment Utilized During Treatment: Rolling walker (2 wheels);Gait belt Nurse Communication: Mobility status  Activity Tolerance: Patient tolerated treatment well Patient left: with call bell/phone within  reach;in chair;with chair alarm set;with family/visitor present  OT Visit Diagnosis: Unsteadiness on feet (R26.81);Other abnormalities of gait and mobility (R26.89);Muscle weakness (generalized) (M62.81)                Time: 4098-1191 OT Time Calculation  (min): 37 min Charges:  OT General Charges $OT Visit: 1 Visit OT Evaluation $OT Eval Moderate Complexity: 1 Mod  Bristol Osentoski K, OTD, OTR/L SecureChat Preferred Acute Rehab [phone]   Carver Fila Koonce 07/19/2023, 12:51 PM

## 2023-07-19 NOTE — Assessment & Plan Note (Signed)
-   Uncontrolled with last hemoglobin of 7.9 - At home requiring 45 to 50 units of Lantus - Placed on resistant SSI

## 2023-07-19 NOTE — Assessment & Plan Note (Signed)
-   At baseline patient is alert and oriented to self, family members and place.  Unable to assess orientation as patient is very hard of hearing although he appears disoriented.

## 2023-07-19 NOTE — [place] FL2 (Cosign Needed Addendum)
[place] MEDICAID FL2 LEVEL OF CARE FORM     IDENTIFICATION  Patient Name: Nicholas Burton Birthdate: 1928-03-06 Sex: male Admission Date (Current Location): 07/18/2023  [facility] and [place] Number:  Producer, television/film/video and Address:  [facility], [address] [place], [place] [address]      Provider Number: [id number]  Attending Physician Name and Address:  Cathren Harsh, MD  Relative Name and Phone Number:  Glade Stanford [phone], son    Current Level of Care: Hospital Recommended Level of Care: Skilled Nursing Facility Prior Approval Number:    Date Approved/Denied:   PASRR Number: [id number]  Discharge Plan: SNF    Current Diagnoses: Patient Active Problem List   Diagnosis Date Noted   Sepsis (HCC) 07/19/2023   COVID-19 virus infection 07/19/2023   UTI (urinary tract infection) 07/19/2023   AKI (acute kidney injury) (HCC) 07/19/2023   Acute hypoxic respiratory failure (HCC) 07/19/2023   Acute on chronic urinary retention 07/19/2023   Need for immunization against influenza 06/22/2023   Insulin dependent type 2 diabetes mellitus (HCC) 02/20/2023   Chronic combined systolic and diastolic congestive heart failure (HCC) 02/20/2023   Bilateral leg edema 11/24/2022   Prostate cancer metastatic to bone (HCC) 10/13/2022   Counseling regarding advance care planning and goals of care 10/13/2022   Spinal stenosis of lumbar region with neurogenic claudication 09/21/2022   Encounter for palliative care involving management of pain 09/21/2022   Late onset Alzheimer's dementia without behavioral disturbance (HCC) 09/21/2020   Iron deficiency anemia secondary to inadequate dietary iron intake 06/06/2019   Chronic diastolic CHF (congestive heart failure) (HCC) 10/30/2018   CRI (chronic renal insufficiency), stage 3 (moderate) 10/25/2018   Venous stasis dermatitis of both lower extremities 01/29/2018   COPD with asthma (HCC) 03/02/2017   Obesity (BMI  30.0-34.9) 04/01/2013   BPH (benign prostatic hyperplasia) 09/08/2011   B12 deficiency 08/13/2010   Vitamin D deficiency 11/10/2008   HLD (hyperlipidemia) 11/10/2008   Essential hypertension 11/10/2008   Osteoarthritis 11/10/2008    Orientation RESPIRATION BLADDER Height & Weight     Self, Place  Normal Indwelling catheter, Incontinent Weight: 175 lb 15.9 oz (79.8 kg) Height:  5\' 9"  (175.3 cm)  BEHAVIORAL SYMPTOMS/MOOD NEUROLOGICAL BOWEL NUTRITION STATUS      Continent Diet (see discharge summary)  AMBULATORY STATUS COMMUNICATION OF NEEDS Skin   Limited Assist Verbally Normal                       Personal Care Assistance Level of Assistance  Bathing, Feeding, Dressing Bathing Assistance: Limited assistance Feeding assistance: Limited assistance Dressing Assistance: Limited assistance     Functional Limitations Info  Sight, Hearing, Speech Sight Info: Adequate Hearing Info: Adequate Speech Info: Adequate    SPECIAL CARE FACTORS FREQUENCY  PT (By licensed PT), OT (By licensed OT)     PT Frequency: 5x/week OT Frequency: 5x/week            Contractures Contractures Info: Not present    Additional Factors Info  Code Status, Allergies Code Status Info: DNR Allergies Info: Ace Inhibitors  Oxycodone  Penicillins           Current Medications (07/19/2023):  This is the current hospital active medication list Current Facility-Administered Medications  Medication Dose Route Frequency Provider Last Rate Last Admin   acetaminophen (TYLENOL) tablet 650 mg  650 mg Oral Q6H PRN Tu, Ching T, DO       ceFEPIme (MAXIPIME)  2 g in sodium chloride 0.9 % 100 mL IVPB  2 g Intravenous BID Tu, Ching T, DO 200 mL/hr at 07/19/23 1021 2 g at 07/19/23 1021   cholecalciferol (VITAMIN D3) 25 MCG (1000 UNIT) tablet 1,000 Units  1,000 Units Oral QPM Tu, Ching T, DO       colesevelam [facility]) tablet 625 mg  625 mg Oral Daily Tu, Ching T, DO   625 mg at 07/19/23 1004   cyanocobalamin  (VITAMIN B12) tablet 500 mcg  500 mcg Oral Daily Tu, Ching T, DO   500 mcg at 07/19/23 1004   enoxaparin (LOVENOX) injection 40 mg  40 mg Subcutaneous Q24H Tu, Ching T, DO   40 mg at 07/19/23 1005   enzalutamide (XTANDI) tablet 80 mg  80 mg Oral Daily Tu, Ching T, DO       insulin aspart (novoLOG) injection 0-20 Units  0-20 Units Subcutaneous TID PC & HS Tu, Ching T, DO   7 Units at 07/19/23 1315   insulin glargine-yfgn (SEMGLEE) injection 20 Units  20 Units Subcutaneous QHS Rai, Ripudeep K, MD       lactated ringers infusion   Intravenous Continuous Mannie Stabile, PA-C   Stopped at 07/18/23 2002   magnesium gluconate (MAGONATE) tablet 500 mg  500 mg Oral QHS Tu, Ching T, DO   500 mg at 07/19/23 0128   melatonin tablet 3 mg  3 mg Oral QHS PRN Tu, Ching T, DO   3 mg at 07/19/23 0131   metoprolol tartrate (LOPRESSOR) tablet 12.5 mg  12.5 mg Oral BID Tu, Ching T, DO   12.5 mg at 07/19/23 1005   mirabegron ER (MYRBETRIQ) tablet 25 mg  25 mg Oral Daily Tu, Ching T, DO   25 mg at 07/19/23 1005   [START ON 07/20/2023] remdesivir 100 mg in sodium chloride 0.9 % 100 mL IVPB  100 mg Intravenous Daily Tu, Ching T, DO       tamsulosin (FLOMAX) capsule 0.4 mg  0.4 mg Oral Daily Tu, Ching T, DO   0.4 mg at 07/19/23 1004     Discharge Medications: Please see discharge summary for a list of discharge medications.  Relevant Imaging Results:  Relevant Lab Results:   Additional Information SSN:[id number] Covid + 07/18/23  Shawnee Higham A [place], [place]

## 2023-07-19 NOTE — Progress Notes (Signed)
Pharmacy Antibiotic Note  Nicholas Burton is a 89 y.o. male admitted on 07/18/2023 with UTI.  Pharmacy has been consulted for Cefepime dosing.  Plan: Cefepime 2 g IV q12h     Temp (24hrs), Avg:100.9 F (38.3 C), Min:100.8 F (38.2 C), Max:101 F (38.3 C)  Recent Labs  Lab 07/18/23 1904 07/18/23 1912  WBC 16.7*  --   CREATININE 1.54*  --   LATICACIDVEN  --  1.5    Estimated Creatinine Clearance: 30 mL/min (A) (by C-G formula based on SCr of 1.54 mg/dL (H)).    Allergies  Allergen Reactions   Ace Inhibitors Cough   Oxycodone Itching and Rash   Penicillins Itching and Rash     Eddie Candle 07/19/2023 12:52 AM

## 2023-07-19 NOTE — Evaluation (Signed)
Physical Therapy Evaluation Patient Details Name: Nicholas Burton MRN: [id number] DOB: 29-Aug-1928 Today's Date: 07/19/2023  History of Present Illness  89 y.o. male presents to ED 11/19 with altered mental status. Admitted for treatment of sepsis secondary to COVID and possible UTI ZDG:[id number], prostate cancer with metastasis to bone, combined systolic and diastolic heart failure, COPD, asthma, insulin-dependent type 2 diabetes, hyperlipidemia, chronic urinary retention with indwelling catheter  Clinical Impression  PTA pt living with wife in single story home with 2 steps to enter. Pt son reports that the [place] is providing 4 hours of HHAide 7 days a week. Family is working to find 8 hours assist a day. Pt was walking with RW in home and requiring assist for bathing and dressing as well as all iADLs. Pt currently limited in safe mobility by decreased hearing and cognition making command follow very difficult, in addition to poor safety awareness in presence of generalized weakness, and decreased balance. Pt requires constant maximal multimodal cuing for sequencing and safety with all mobility. Pt is supervision to get to EoB and minAx2 for transfer to standing in RW. Pt requires modAx2 for safety and sequencing with RW to ambulate and get into and out of small bathroom. Patient will benefit from continued inpatient follow up therapy, <3 hours/day. PT will continue to follow acutely.        If plan is discharge home, recommend the following: A little help with walking and/or transfers;A lot of help with bathing/dressing/bathroom;Assistance with cooking/housework;Direct supervision/assist for medications management;Direct supervision/assist for financial management;Assist for transportation;Help with stairs or ramp for entrance;Supervision due to cognitive status   Can travel by private vehicle    Yes    Equipment Recommendations None recommended by PT     Functional Status Assessment Patient has had  a recent decline in their functional status and demonstrates the ability to make significant improvements in function in a reasonable and predictable amount of time.     Precautions / Restrictions Precautions Precautions: Fall Precaution Comments: several in the last 8 weeks, usually ends up sitting back Restrictions Weight Bearing Restrictions: No      Mobility  Bed Mobility Overal bed mobility: Needs Assistance Bed Mobility: Supine to Sit     Supine to sit: HOB elevated, Used rails, Supervision     General bed mobility comments: with high visual cues pt able to come to EoB with supervision.    Transfers Overall transfer level: Needs assistance Equipment used: Rolling walker (2 wheels) Transfers: Sit to/from Stand Sit to Stand: Min assist, +2 safety/equipment           General transfer comment: good power up, increased time to steady in RW, no c/o of dizziness    Ambulation/Gait Ambulation/Gait assistance: +2 safety/equipment, Mod assist Gait Distance (Feet): 10 Feet (+10) Assistive device: Rolling walker (2 wheels) Gait Pattern/deviations: Step-through pattern, Decreased step length - right, Decreased step length - left, Shuffle, Trunk flexed Gait velocity: slowed Gait velocity interpretation: <1.31 ft/sec, indicative of household ambulator   General Gait Details: light min A for steadying and assist for managment of RW and IV pole in very small bathroom     Balance Overall balance assessment: Needs assistance Sitting-balance support: Feet unsupported, Bilateral upper extremity supported, No upper extremity supported Sitting balance-Leahy Scale: Good     Standing balance support: Bilateral upper extremity supported, During functional activity, Reliant on assistive device for balance Standing balance-Leahy Scale: Fair Standing balance comment: requires UE support for dynamic balance  Pertinent Vitals/Pain Pain  Assessment Pain Assessment: Faces Pain Score: 0-No pain    Home Living Family/patient expects to be discharged to:: Private residence Living Arrangements: Spouse/significant other;Other (Comment) (caregiver 4-8pm currently, but family wanting to incr) Available Help at Discharge: Family;Available 24 hours/day Type of Home: House Home Access: Stairs to enter   [employer] of Steps: 2   Home Layout: One level Home Equipment: Lift chair;Shower Counsellor (2 wheels)      Prior Function Prior Level of Function : History of Falls (last six months)             Mobility Comments: Using RW for mobility tasks, not moving as much as has been ADLs Comments: needing assist with bADLs and iADLs, HHAide 4-8pm daily, family working on 12pm-8pm Aide     Extremity/Trunk Assessment   Upper Extremity Assessment Upper Extremity Assessment: Defer to OT evaluation    Lower Extremity Assessment Lower Extremity Assessment: Generalized weakness    Cervical / Trunk Assessment Cervical / Trunk Assessment: Kyphotic  Communication   Communication Communication: Hearing impairment Cueing Techniques: Verbal cues;Gestural cues;Tactile cues;Visual cues;Other (comments) (has hearing aides in place)  Cognition Arousal: Alert Behavior During Therapy: WFL for tasks assessed/performed, Flat affect Overall Cognitive Status: Difficult to assess                                 General Comments: pt is A&Ox4 but is very HoH Has difficulty with command follow likely to be a combination of HoH and decreased cognition        General Comments General comments (skin integrity, edema, etc.): son present provides PLOF and home set up, able to maintain SpO2 91%O2 with ambulation in room        Assessment/Plan    PT Assessment Patient needs continued PT services  PT Problem List Decreased strength;Decreased activity tolerance;Decreased balance;Decreased mobility;Decreased  coordination;Decreased cognition;Decreased safety awareness;Cardiopulmonary status limiting activity       PT Treatment Interventions DME instruction;Gait training;Functional mobility training;Therapeutic activities;Therapeutic exercise;Balance training;Stair training;Cognitive remediation;Patient/family education    PT Goals (Current goals can be found in the Care Plan section)  Acute Rehab PT Goals PT Goal Formulation: With patient/family Time For Goal Achievement: 08/02/23 Potential to Achieve Goals: Fair    Frequency Min 1X/week        AM-PAC PT "6 Clicks" Mobility  Outcome Measure Help needed turning from your back to your side while in a flat bed without using bedrails?: A Little Help needed moving from lying on your back to sitting on the side of a flat bed without using bedrails?: A Little Help needed moving to and from a bed to a chair (including a wheelchair)?: A Lot Help needed standing up from a chair using your arms (e.g., wheelchair or bedside chair)?: A Little Help needed to walk in hospital room?: A Lot Help needed climbing 3-5 steps with a railing? : A Lot 6 Click Score: 15    End of Session Equipment Utilized During Treatment: Gait belt Activity Tolerance: Patient tolerated treatment well Patient left: in chair;with call bell/phone within reach;with chair alarm set;with family/visitor present Nurse Communication: Mobility status PT Visit Diagnosis: Muscle weakness (generalized) (M62.81);Other abnormalities of gait and mobility (R26.89);Repeated falls (R29.6);Difficulty in walking, not elsewhere classified (R26.2)    Time: 8469-6295 PT Time Calculation (min) (ACUTE ONLY): 36 min   Charges:   PT Evaluation $PT Eval Moderate Complexity: 1 Mod   PT General  Charges $$ ACUTE PT VISIT: 1 Visit         Kaity Pitstick B. Beverely Risen PT, DPT Acute Rehabilitation Services Please use secure chat or  Call Office [phone]   Elon Alas [facility] 07/19/2023,  12:03 PM

## 2023-07-19 NOTE — ED Notes (Signed)
ED TO INPATIENT HANDOFF REPORT  ED Nurse Name and Phone #:  Nicholas Burton Name/Age/Gender Consepcion Hearing Burton 89 y.o. male Room/Bed: 001C/001C  Code Status   Code Status: Limited: Do not attempt resuscitation (DNR) -DNR-LIMITED -Do Not Intubate/DNI   Home/SNF/Other Home Patient oriented to: self Is this baseline? No   Triage Complete: Triage complete  Chief Complaint Sepsis [facility]) [A41.9]  Triage Note Pt BIB [facility] from home normally aox4 living with family, but is now aox2 per EMS. Pts RR in triage 36, SpO2 89% on RA. Chronic foley cath POA.   Allergies Allergies  Allergen Reactions   Ace Inhibitors Cough   Oxycodone Itching and Rash   Penicillins Itching and Rash    Level of Care/Admitting Diagnosis ED Disposition     ED Disposition  Admit   Condition  --   Comment  Hospital Area: [facility] [[id number]]  Level of Care: Telemetry Medical [104]  May admit patient to [facility] or [facility] if equivalent level of care is available:: No  Covid Evaluation: Asymptomatic - no recent exposure (last 10 days) testing not required  Diagnosis: Sepsis [facility]) [[id number]]  Admitting Physician: Anselm Jungling [[id number]]  Attending Physician: Anselm Jungling [[id number]]  Certification:: I certify this patient will need inpatient services for at least 2 midnights  Expected Medical Readiness: 07/22/2023          B Medical/Surgery History Past Medical History:  Diagnosis Date   Allergy    ANEMIA ASSOCIATED W/OTHER [facility] NUTRITIONAL DEFIC 08/11/2010   Qualifier: Diagnosis of  By: Yetta Barre MD, Bernadene Bell.    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    History of skin cancer    Hyperlipidemia with target LDL less than 100 11/10/2008        Hypertension    Memory loss 05/14/2009   Obesity (BMI 30.0-34.9) 04/01/2013   Osteoarthritis    PSA elevation 10/25/2018   SKIN CANCER, HX OF 11/10/2008   Qualifier: Diagnosis of  By: Yetta Barre MD, Bernadene Bell.    Snoring 04/03/2014   Type II diabetes mellitus with manifestations (HCC) 11/10/2008   Estimated Creatinine Clearance: 38 mL/min (A) (by C-G formula based on SCr of 1.52 mg/dL (H)).    Venous stasis dermatitis of both lower extremities 01/29/2018   Vitamin D deficiency 11/10/2008   Past Surgical History:  Procedure Laterality Date   CHOLECYSTECTOMY     INGUINAL HERNIA REPAIR     x 3   IR FLUORO GUIDED NEEDLE PLC ASPIRATION/INJECTION LOC  10/07/2022   JOINT REPLACEMENT     KNEE SURGERY     x 2   MELANOMA EXCISION     x 3 -- Left arm   TOTAL KNEE ARTHROPLASTY     x 3     A IV Location/Drains/Wounds Patient Lines/Drains/Airways Status     Active Line/Drains/Airways     Name Placement date Placement time Site Days   Peripheral IV 07/18/23 18 G 1" Anterior;Distal;Right;Upper Antecubital 07/18/23  1923  Antecubital  1   Peripheral IV 07/18/23 20 G 1" Anterior;Right Forearm  07/18/23  --  Forearm  1   Urethral Catheter Paden, RN Straight-tip;Non-latex 16 Fr. 07/18/23  2225  Straight-tip;Non-latex  1            Intake/Output Last 24 hours  Intake/Output Summary (Last 24 hours) at 07/19/2023 0207 Last data filed at 07/18/2023 2355 Gross per 24 hour  Intake 1487.95 ml  Output --  Net 1487.95 ml    Labs/Imaging Results for orders placed or performed during the hospital encounter of 07/18/23 (from the past 48 hour(s))  Comprehensive metabolic panel     Status: Abnormal   Collection Time: 07/18/23  7:04 PM  Result Value Ref Range   Sodium 137 135 - 145 mmol/L   Potassium 4.2 3.5 - 5.1 mmol/L    Comment: HEMOLYSIS AT THIS LEVEL MAY AFFECT RESULT   Chloride 101 98 - 111  mmol/L   CO2 25 22 - 32 mmol/L   Glucose, Bld 384 (H) 70 - 99 mg/dL    Comment: Glucose reference range applies only to samples taken after fasting for at least 8 hours.   BUN 20 8 - 23 mg/dL   Creatinine, Ser 9.60 (H) 0.61 - 1.24 mg/dL   Calcium 8.6 (L) 8.9 - 10.3 mg/dL   Total Protein 5.9 (L) 6.5 - 8.1 g/dL   Albumin 2.8 (L) 3.5 - 5.0 g/dL   AST 23 15 - 41 U/L    Comment: HEMOLYSIS AT THIS LEVEL MAY AFFECT RESULT   ALT 12 0 - 44 U/L    Comment: HEMOLYSIS AT THIS LEVEL MAY AFFECT RESULT   Alkaline Phosphatase 125 38 - 126 U/L   Total Bilirubin 1.0 <1.2 mg/dL    Comment: HEMOLYSIS AT THIS LEVEL MAY AFFECT RESULT   GFR, Estimated 42 (L) >60 mL/min    Comment: (NOTE) Calculated using the CKD-EPI Creatinine Equation (2021)    Anion gap 11 5 - 15    Comment: Performed at [facility], [address] [place], [place] [address]  CBC with Differential     Status: Abnormal   Collection Time: 07/18/23  7:04 PM  Result Value Ref Range   WBC 16.7 (H) 4.0 - 10.5 K/uL   RBC 4.36 4.22 - 5.81 MIL/uL   Hemoglobin 12.7 (L) 13.0 - 17.0 g/dL   HCT 81.1 91.4 - 78.2 %   MCV 89.9 80.0 - 100.0 fL   MCH 29.1 26.0 - 34.0 pg   MCHC 32.4 30.0 - 36.0 g/dL   RDW 95.6 21.3 - 08.6 %   Platelets 206 150 - 400 K/uL   nRBC 0.0 0.0 - 0.2 %   Neutrophils Relative % 84 %   Neutro Abs 14.0 (H) 1.7 - 7.7 K/uL   Lymphocytes Relative 5 %   Lymphs Abs 0.9 0.7 - 4.0 K/uL   Monocytes Relative 10 %   Monocytes Absolute 1.6 (H) 0.1 - 1.0 K/uL   Eosinophils Relative 0 %   Eosinophils Absolute 0.0 0.0 - 0.5 K/uL   Basophils Relative 0 %   Basophils Absolute 0.0 0.0 - 0.1 K/uL   Immature Granulocytes 1 %   Abs Immature Granulocytes 0.17 (H) 0.00 - 0.07 K/uL    Comment: Performed at [facility], [address], [place], [place] [address]  I-Stat Lactic Acid, ED     Status: None   Collection Time: 07/18/23  7:12 PM  Result Value Ref Range   Lactic Acid, Venous 1.5 0.5 - 1.9 mmol/L  Protime-INR  Status: None   Collection Time: 07/18/23  7:35 PM  Result Value Ref Range   Prothrombin Time 13.5 11.4 - 15.2 seconds   INR 1.0 0.8 - 1.2    Comment: (NOTE) INR goal varies based on device and disease states. Performed at [facility], [address] [place], [place] [address]   APTT     Status: None   Collection Time: 07/18/23  7:35 PM  Result Value Ref Range   aPTT 31 24 - 36 seconds    Comment: Performed at [facility], [address] [place], [place] [address]  Brain natriuretic peptide     Status: Abnormal   Collection Time: 07/18/23  7:35 PM  Result Value Ref Range   B Natriuretic Peptide 126.3 (H) 0.0 - 100.0 pg/mL    Comment: Performed at [facility], [address] [place], [place] [address]  Lipase, blood     Status: None   Collection Time: 07/18/23  7:35 PM  Result Value Ref Range   Lipase 21 11 - 51 U/L    Comment: Performed at [facility], [address], [place], [place] [address]  Resp panel by RT-PCR (RSV, Flu A&B, Covid) Anterior Nasal Swab     Status: Abnormal   Collection Time: 07/18/23  9:16 PM   Specimen: Anterior Nasal Swab  Result Value Ref Range   SARS Coronavirus 2 by RT PCR POSITIVE (A) NEGATIVE   Influenza A by PCR NEGATIVE NEGATIVE   Influenza B by PCR NEGATIVE NEGATIVE    Comment: (NOTE) The Xpert Xpress SARS-CoV-2/FLU/RSV plus assay is intended as an aid in the diagnosis of influenza from Nasopharyngeal swab specimens and should not be used as a sole basis for treatment. Nasal washings and aspirates are unacceptable for Xpert Xpress SARS-CoV-2/FLU/RSV testing.  Fact Sheet for Patients: BloggerCourse.com  Fact Sheet for Healthcare Providers: SeriousBroker.it  This test is not yet approved or cleared by the [place] FDA and has been authorized for detection and/or diagnosis of SARS-CoV-2 by FDA under an Emergency Use Authorization (EUA). This EUA will  remain in effect (meaning this test can be used) for the duration of the COVID-19 declaration under Section 564(b)(1) of the Act, 21 U.S.C. section 360bbb-3(b)(1), unless the authorization is terminated or revoked.     Resp Syncytial Virus by PCR NEGATIVE NEGATIVE    Comment: (NOTE) Fact Sheet for Patients: BloggerCourse.com  Fact Sheet for Healthcare Providers: SeriousBroker.it  This test is not yet approved or cleared by the [place] FDA and has been authorized for detection and/or diagnosis of SARS-CoV-2 by FDA under an Emergency Use Authorization (EUA). This EUA will remain in effect (meaning this test can be used) for the duration of the COVID-19 declaration under Section 564(b)(1) of the Act, 21 U.S.C. section 360bbb-3(b)(1), unless the authorization is terminated or revoked.  Performed at [facility], [address], [place], [place] [address]   Urinalysis, w/ Reflex to Culture (Infection Suspected) -Urine, Catheterized; Indwelling urinary catheter     Status: Abnormal   Collection Time: 07/18/23  9:37 PM  Result Value Ref Range   Specimen Source URINE, CATHETERIZED    Color, Urine STRAW (A) YELLOW   APPearance HAZY (A) CLEAR   Specific Gravity, Urine 1.011 1.005 - 1.030   pH 7.0 5.0 - 8.0   Glucose, UA >=500 (A) NEGATIVE mg/dL   Hgb urine dipstick SMALL (A) NEGATIVE   Bilirubin Urine NEGATIVE NEGATIVE   Ketones,  ur 5 (A) NEGATIVE mg/dL   Protein, ur NEGATIVE NEGATIVE mg/dL   Nitrite NEGATIVE NEGATIVE   Leukocytes,Ua LARGE (A) NEGATIVE   RBC / HPF 6-10 0 - 5 RBC/hpf   WBC, UA >50 0 - 5 WBC/hpf    Comment:        Reflex urine culture not performed if WBC <=10, OR if Squamous epithelial cells >5. If Squamous epithelial cells >5 suggest recollection.    Bacteria, UA NONE SEEN NONE SEEN   Squamous Epithelial / HPF 0-5 0 - 5 /HPF    Comment: Performed at [facility], [address]  [place], [place] [address]  Urine Culture     Status: None (Preliminary result)   Collection Time: 07/18/23  9:37 PM   Specimen: Urine, Catheterized  Result Value Ref Range   Specimen Description URINE, CATHETERIZED    Special Requests      NONE Reflexed from [id number] Performed at [facility], [address] [place], [place] [address]    Culture PENDING    Report Status PENDING    CT Head Wo Contrast  Result Date: 07/18/2023 CLINICAL DATA:  Altered mental status EXAM: CT HEAD WITHOUT CONTRAST CT CERVICAL SPINE WITHOUT CONTRAST TECHNIQUE: Multidetector CT imaging of the head and cervical spine was performed following the standard protocol without intravenous contrast. Multiplanar CT image reconstructions of the cervical spine were also generated. RADIATION DOSE REDUCTION: This exam was performed according to the departmental dose-optimization program which includes automated exposure control, adjustment of the mA and/or kV according to patient size and/or use of iterative reconstruction technique. COMPARISON:  10/05/2022 CT head, no prior CT cervical spine, correlation is made with 04/10/2012 cervical spine radiographs FINDINGS: Evaluation of the head and cervical spine is limited by motion. CT HEAD FINDINGS Brain: No evidence of acute infarct, hemorrhage, mass, mass effect, or midline shift. No hydrocephalus or extra-axial fluid collection. Age related cerebral atrophy. Vascular: No hyperdense vessel. Atherosclerotic calcifications in the intracranial carotid and vertebral arteries. Skull: Negative for fracture or focal lesion. Sinuses/Orbits: Complete opacification of the right maxillary sinus. Mucosal thickening in the left maxillary sinus and bilateral ethmoid air cells. Status post bilateral lens replacements. Other: The mastoid air cells are well aerated. CT CERVICAL SPINE FINDINGS Alignment: No traumatic listhesis. Mild anterolisthesis of C2 on C3, C3 on C4, and C7 on T1 Skull base and  vertebrae: No acute fracture or suspicious osseous lesion. Soft tissues and spinal canal: No prevertebral fluid or swelling. No visible canal hematoma. Disc levels: Degenerative changes in the cervical spine.Limited evaluation given the degree of motion. Upper chest: No focal pulmonary opacity or pleural effusion. IMPRESSION: Evaluation of the head and cervical spine is limited by motion. Within this limitation, no acute intracranial process. No acute fracture or traumatic listhesis in the cervical spine. Electronically Signed   By: Wiliam Ke M.D.   On: 07/18/2023 22:28   CT CERVICAL SPINE WO CONTRAST  Result Date: 07/18/2023 CLINICAL DATA:  Altered mental status EXAM: CT HEAD WITHOUT CONTRAST CT CERVICAL SPINE WITHOUT CONTRAST TECHNIQUE: Multidetector CT imaging of the head and cervical spine was performed following the standard protocol without intravenous contrast. Multiplanar CT image reconstructions of the cervical spine were also generated. RADIATION DOSE REDUCTION: This exam was performed according to the departmental dose-optimization program which includes automated exposure control, adjustment of the mA and/or kV according to patient size and/or use of iterative reconstruction technique. COMPARISON:  10/05/2022 CT head, no prior CT cervical spine, correlation  is made with 04/10/2012 cervical spine radiographs FINDINGS: Evaluation of the head and cervical spine is limited by motion. CT HEAD FINDINGS Brain: No evidence of acute infarct, hemorrhage, mass, mass effect, or midline shift. No hydrocephalus or extra-axial fluid collection. Age related cerebral atrophy. Vascular: No hyperdense vessel. Atherosclerotic calcifications in the intracranial carotid and vertebral arteries. Skull: Negative for fracture or focal lesion. Sinuses/Orbits: Complete opacification of the right maxillary sinus. Mucosal thickening in the left maxillary sinus and bilateral ethmoid air cells. Status post bilateral lens  replacements. Other: The mastoid air cells are well aerated. CT CERVICAL SPINE FINDINGS Alignment: No traumatic listhesis. Mild anterolisthesis of C2 on C3, C3 on C4, and C7 on T1 Skull base and vertebrae: No acute fracture or suspicious osseous lesion. Soft tissues and spinal canal: No prevertebral fluid or swelling. No visible canal hematoma. Disc levels: Degenerative changes in the cervical spine.Limited evaluation given the degree of motion. Upper chest: No focal pulmonary opacity or pleural effusion. IMPRESSION: Evaluation of the head and cervical spine is limited by motion. Within this limitation, no acute intracranial process. No acute fracture or traumatic listhesis in the cervical spine. Electronically Signed   By: Wiliam Ke M.D.   On: 07/18/2023 22:28   DG Chest Portable 1 View  Result Date: 07/18/2023 CLINICAL DATA:  AMS Reason for exam: ams, declining mental status; best attainable images due to pt condition and inability to following directions/hold still EXAM: PORTABLE CHEST 1 VIEW COMPARISON:  Chest x-ray 06/02/2021, CT chest 08/11/2023 FINDINGS: Slightly limited evaluation due to patient positioning. The heart and mediastinal contours are grossly unchanged. Atherosclerotic plaque No focal consolidation. No pulmonary edema. Blunting of the left costophrenic angle likely due to positioning. No pleural effusion. No pneumothorax. No acute osseous abnormality. IMPRESSION: No active disease. Slightly limited evaluation due to patient positioning. Electronically Signed   By: Tish Frederickson M.D.   On: 07/18/2023 22:23    Pending Labs Unresulted Labs (From admission, onward)     Start     Ordered   07/19/23 0500  CBC  Tomorrow morning,   R        07/19/23 0047   07/19/23 0500  Basic metabolic panel  Tomorrow morning,   R        07/19/23 0047   07/18/23 1903  Blood Culture (routine x 2)  (Undifferentiated presentation (screening labs and basic nursing orders))  BLOOD CULTURE X 2,   STAT       07/18/23 1902            Vitals/Pain Today's Vitals   07/19/23 0015 07/19/23 0100 07/19/23 0126 07/19/23 0130  BP:  (!) 165/86  (!) 165/86  Pulse: 93 92  98  Resp: 19 (!) 28  (!) 22  Temp:   99 F (37.2 C)   TempSrc:   Oral   SpO2: 94% 96%  97%    Isolation Precautions Airborne and Contact precautions  Medications Medications  lactated ringers infusion ( Intravenous New Bag/Given 07/18/23 2002)  enoxaparin (LOVENOX) injection 40 mg (has no administration in time range)  insulin aspart (novoLOG) injection 0-20 Units (has no administration in time range)  acetaminophen (TYLENOL) tablet 650 mg (has no administration in time range)  remdesivir 200 mg in sodium chloride 0.9% 250 mL IVPB (has no administration in time range)    Followed by  remdesivir 100 mg in sodium chloride 0.9 % 100 mL IVPB (has no administration in time range)  enzalutamide (XTANDI) tablet 80 mg (has no  administration in time range)  metoprolol tartrate (LOPRESSOR) tablet 12.5 mg (12.5 mg Oral Given 07/19/23 0130)  colesevelam [facility]) tablet 625 mg (has no administration in time range)  tamsulosin (FLOMAX) capsule 0.4 mg (has no administration in time range)  mirabegron ER (MYRBETRIQ) tablet 25 mg (has no administration in time range)  cyanocobalamin (VITAMIN B12) tablet 500 mcg (has no administration in time range)  melatonin tablet 3 mg (3 mg Oral Given 07/19/23 0131)  cholecalciferol (VITAMIN D3) 25 MCG (1000 UNIT) tablet 1,000 Units (has no administration in time range)  magnesium gluconate (MAGONATE) tablet 500 mg (500 mg Oral Given 07/19/23 0128)  lactated ringers bolus 1,000 mL (0 mLs Intravenous Stopped 07/18/23 2220)  metroNIDAZOLE (FLAGYL) IVPB 500 mg (0 mg Intravenous Stopped 07/18/23 2145)  ceFEPIme (MAXIPIME) 2 g in sodium chloride 0.9 % 100 mL IVPB (0 g Intravenous Stopped 07/18/23 2145)  vancomycin (VANCOREADY) IVPB 1500 mg/300 mL (0 mg Intravenous Stopped 07/18/23 2355)  acetaminophen  (TYLENOL) suppository 650 mg (650 mg Rectal Given 07/18/23 2111)    Mobility walks with device     Focused Assessments     R Recommendations: See Admitting Provider Note  Report given to:   Additional Notes:  According to family patient is A&Ox4 at baseline during the day, patient is Alert to only self at this time. Patient calm and not actively trying to get out of bed. Very HOH.

## 2023-07-19 NOTE — Progress Notes (Signed)
Ok to replace k with x1 per Dr. Isidoro Donning.  Ulyses Southward, PharmD, BCIDP, AAHIVP, CPP Infectious Disease Pharmacist 07/19/2023 8:54 AM

## 2023-07-19 NOTE — Progress Notes (Signed)
Mobility Specialist Progress Note:   07/19/23 1150  Mobility  Activity Transferred to/from [facility] ([facility]  Level of Assistance Minimal assist, patient does 75% or more  Assistive Device Front wheel walker  Distance Ambulated (ft) 12 ft  Activity Response Tolerated well  $Mobility charge 1 Mobility  Mobility Specialist Start Time (ACUTE ONLY) 1140  Mobility Specialist Stop Time (ACUTE ONLY) 1150  Mobility Specialist Time Calculation (min) (ACUTE ONLY) 10 min   Pt needed assistance to ambulate to bathroom from chair. Required MinA to stand, CGA during ambulation with RW. Void successful, NT assisted with peri care. Pt is HOH, had difficulty hearing and thus following verbal cues for safety. Tolerated return to chair well. RN notified. Left with all needs met, son and RN in room, all bell in reach, all needs met.   Feliciana Rossetti Mobility Specialist Please contact via Special educational needs teacher or  Rehab office at [phone]

## 2023-07-19 NOTE — H&P (Incomplete)
History and Physical    Patient: Nicholas Burton:[id number] DOB: [birth year] DOA: 07/18/2023 DOS: the patient was seen and examined on 07/19/2023 PCP: Etta Grandchild, MD  Patient coming from: {Point_of_Origin:[address]}  Chief Complaint:  Chief Complaint  Patient presents with  . Altered Mental Status   HPI: Nicholas Burton is a 89 y.o. male with medical history significant of ***  Review of Systems: {ROS_Text:26778} Past Medical History:  Diagnosis Date  . Allergy   . ANEMIA ASSOCIATED W/OTHER [facility] NUTRITIONAL DEFIC 08/11/2010   Qualifier: Diagnosis of  By: Yetta Barre MD, Bernadene Bell.   . Cancer (HCC)    skin  . Cataract   . Chronic combined systolic and diastolic congestive heart failure (HCC) 10/30/2018  . COPD with asthma (HCC) 03/02/2017  . CRI (chronic renal insufficiency), stage 3 (moderate) (HCC) 10/25/2018  . Deficiency anemia 10/25/2018  . Diabetes mellitus    type 2  . DOE (dyspnea on exertion) 09/03/2018  . Edema 06/11/2009   Qualifier: Diagnosis of  By: Yetta Barre MD, Bernadene Bell.   . History of skin cancer   . Hyperlipidemia with target LDL less than 100 11/10/2008       . Hypertension   . Memory loss 05/14/2009  . Obesity (BMI 30.0-34.9) 04/01/2013  . Osteoarthritis   . PSA elevation 10/25/2018  . SKIN CANCER, HX OF 11/10/2008   Qualifier: Diagnosis of  By: Yetta Barre MD, Bernadene Bell.   . Snoring 04/03/2014  . Type II diabetes mellitus with manifestations (HCC) 11/10/2008   Estimated Creatinine Clearance: 38 mL/min (A) (by C-G formula based on SCr of 1.52 mg/dL (H)).   . Venous stasis dermatitis of both lower extremities 01/29/2018  . Vitamin D deficiency 11/10/2008   Past Surgical History:  Procedure Laterality Date  . CHOLECYSTECTOMY    . INGUINAL HERNIA REPAIR     x 3  . IR FLUORO GUIDED NEEDLE PLC ASPIRATION/INJECTION LOC  10/07/2022  . JOINT REPLACEMENT    . KNEE SURGERY     x 2  . MELANOMA EXCISION     x 3 -- Left arm  . TOTAL KNEE ARTHROPLASTY     x 3   Social History:  reports that  he has never smoked. He has never used smokeless tobacco. He reports that he does not drink alcohol and does not use drugs.  Allergies  Allergen Reactions  . Ace Inhibitors Cough  . Oxycodone Itching and Rash  . Penicillins Itching and Rash    Family History  Problem Relation Age of Onset  . Heart disease Mother   . Diabetes Mother   . Heart disease Father   . Cancer Sister   . Cancer Brother   . Diabetes Son   . Diabetes Other     Prior to Admission medications   Medication Sig Start Date End Date Taking? Authorizing Provider  acetaminophen (TYLENOL) 325 MG tablet Take 1-2 tablets (325-650 mg total) by mouth every 6 (six) hours as needed for mild pain. 10/21/22  Yes Angiulli, Mcarthur Rossetti, PA-C  cholecalciferol (VITAMIN D) 1000 units tablet Take 1 tablet (1,000 Units total) by mouth daily. Patient taking differently: Take 1,000 Units by mouth every evening. 10/21/22  Yes Angiulli, Mcarthur Rossetti, PA-C  Colesevelam HCl 3.75 g PACK Take 1 packet by mouth daily. 02/06/23  Yes Corky Crafts, MD  cyanocobalamin (VITAMIN B12) 500 MCG tablet Take 1 tablet (500 mcg total) by mouth daily. 10/21/22  Yes Angiulli, Mcarthur Rossetti, PA-C  dexamethasone (DECADRON) 4 MG  tablet Take 1 tablet (4 mg total) by mouth daily. 02/20/23  Yes Etta Grandchild, MD  empagliflozin (JARDIANCE) 10 MG TABS tablet Take 1 tablet (10 mg total) by mouth daily before breakfast. 07/05/23  Yes Etta Grandchild, MD  LANTUS SOLOSTAR 100 UNIT/ML Solostar Pen Inject 50 Units into the skin daily. Patient taking differently: Inject 50 Units into the skin at bedtime. 06/23/23  Yes Etta Grandchild, MD  magnesium gluconate (MAGONATE) 500 MG tablet Take 1 tablet (500 mg total) by mouth at bedtime. 10/21/22  Yes Angiulli, Mcarthur Rossetti, PA-C  melatonin 3 MG TABS tablet Take 1 tablet (3 mg total) by mouth at bedtime as needed. 10/21/22  Yes Angiulli, Mcarthur Rossetti, PA-C  metoprolol tartrate (LOPRESSOR) 25 MG tablet TAKE ONE-HALF (1/2) TABLET TWICE A DAY 01/25/23   Yes Corky Crafts, MD  Multiple Vitamins-Minerals (PRESERVISION/LUTEIN) CAPS Take 1 capsule by mouth 2 (two) times daily.   Yes [provider]  tamsulosin (FLOMAX) 0.4 MG CAPS capsule Take 1 capsule (0.4 mg total) by mouth daily. 02/20/23  Yes Etta Grandchild, MD  torsemide (DEMADEX) 10 MG tablet Take 1 tablet (10 mg total) by mouth every other day. 02/06/23  Yes Corky Crafts, MD  Vibegron [facility]) 75 MG TABS TAKE 1 TABLET DAILY 06/05/23  Yes Etta Grandchild, MD  XTANDI 80 MG tablet TAKE 1 TABLET DAILY 06/23/23  Yes Johney Maine, MD  atorvastatin (LIPITOR) 10 MG tablet Take 1 tablet (10 mg total) by mouth daily. Pt needs to keep upcoming appt in Oct for further refills Patient not taking: Reported on 07/18/2023 01/31/23   Corky Crafts, MD  Insulin Pen Needle (NOVOFINE) 30G X 8 MM MISC Inject 10 each into the skin as needed. 02/20/23   Etta Grandchild, MD    Physical Exam: Vitals:   07/18/23 2230 07/18/23 2245 07/18/23 2300 07/18/23 2335  BP:   (!) 151/76 (!) 155/82  Pulse: (!) 112 (!) 106 (!) 104 (!) 101  Resp: (!) 32 (!) 26 (!) 22 13  Temp:      TempSrc:      SpO2: 100% 95% 97% 96%   *** Data Reviewed: {Tip this will not be part of the note when signed- Document your independent interpretation of telemetry tracing, EKG, lab, Radiology test or any other diagnostic tests. Add any new diagnostic test ordered today. (Optional):26781} {Results:26384}  Assessment and Plan: No notes have been filed under this hospital service. Service: Hospitalist     Advance Care Planning:   Code Status: Prior ***  Consults: ***  Family Communication: ***  Severity of Illness: {Observation/Inpatient:21159}  Author: Anselm Jungling, DO 07/19/2023 12:01 AM  For on call review [email or web address].

## 2023-07-19 NOTE — Progress Notes (Signed)
[facility]                                                                              Nicholas Burton, is a 89 y.o. male, DOB - 1928/05/08, ZOX:[id number] Admit date - 07/18/2023    Outpatient Primary MD for the patient is Etta Grandchild, MD  LOS - 0  days  Chief Complaint  Patient presents with   Altered Mental Status       Brief summary   Patient is a 89 year old male with dementia, prostate CA with metastasis to bones, combined systolic and diastolic CHF, COPD, asthma, IDDM, hyperlipidemia, chronic urinary retention with indwelling catheter presented with confusion and disorientation.  Patient also has significant hearing deficit.  Patient lives at home with his wife and reportedly fell at home at 2 AM the night before the admission and was found on the floor at the end of the bed.  On the morning of admission, patient was putting toothpaste and hand soap on his hair which is atypical.  CBG was 481 for EMS.  Patient also had " cough and cold" since the weekend.  Per son, he had more labored respiration and cough. In ED, afebrile, tachycardic HR 111, BP 136/52, O2 sats 94% on 4 L, RR 36 CBC showed leukocytosis 16.7, CMP showed creatinine 1.54, hyperglycemia with glucose 384.  UA positive for UTI.  CT head showed no acute intracranial process.  Chest x-ray negative.  COVID-19 positive  Assessment & Plan    Principal Problem:   Sepsis (HCC), POA likely due to COVID-19, UTI -Patient met sepsis criteria on admission with tachypnea, tachycardia, hypoxia, leukocytosis -Started on IV remdesivir -Continue airborne precautions -Hypoxia improving, will hold off on steroids  Active problems Acute respiratory failure with hypoxia secondary to COVID-19 -Hypoxia improving, was placed on 4 L O2 via [place] in ED, wean as tolerated -Continue IV remdesivir,  symptomatic management -Airborne precautions  Acute on chronic urinary retention -with hx of BPH -Foley catheter exchanged  in ED.  [facility] -Continue tamsulosin -Follow urine culture and sensitivities, continue IV Rocephin     AKI (acute kidney injury) (HCC) - Likely secondary to dehydration and sepsis.  Has chronic indwelling Foley catheter - Creatinine elevated 1.54 on admission - Placed on fluid hydration, creatinine improving, 1.2 -Hold torsemide  Acute cystitis -UA showed 5 ketones, large leukocytes, WBCs more than 50, no bacteria.  Prior history of Pseudomonas UTI on 01/26/2023 -Urine culture in process, continue IV cefepime for now   Insulin dependent type 2 diabetes mellitus (HCC), uncontrolled with hyperglycemia - Hemoglobin A1c 7.9 on 06/22/2023  -Outpatient on Lantus 50 units at bedtime, Jardiance 10 mg daily - Placed on resistant SSI CBG (last 3)  Recent Labs    07/19/23 0845 07/19/23 1208  GLUCAP 203* 224*   -Start Semglee 20 units daily at bedtime, will adjust insulin regimen   Prostate cancer metastatic to bone [facility]) -follows with Dr. Candise Che -on daily Xtandi and q3 month Leuprolid injections   Late onset Alzheimer's dementia without behavioral disturbance (HCC) - At baseline patient is alert and oriented to self, family members  and place.  Unable to assess orientation as patient is very hard of hearing although he appears disoriented. -Delirium precautions   Essential hypertension - Controlled, not on antihypertensives   HLD (hyperlipidemia) - Recently discontinued off statin due to potential interaction with Xtandi -continue colesevelam     Fall in the hospital -Patient had a mechanical fall today while trying to ambulate with a walker to use the restroom -Per RN, no loss of consciousness or hit his head, not in any pain  Generalized debility -Discussed in detail with the patient's son at the bedside, patient has been having recurrent falls at home, lives with his wife who is also elderly and unable to assist -PT OT evaluation, likely  will need higher level of care  Estimated body mass index is 25.98 kg/m as calculated from the following:   Height as of this encounter: 5' 9.02" (1.753 m).   Weight as of 07/13/23: 79.8 kg.  Code Status: DNR DVT Prophylaxis:  enoxaparin (LOVENOX) injection 40 mg Start: 07/19/23 1000   Level of Care: Level of care: Telemetry Medical Family Communication: Updated patient's son at bedside Disposition Plan:      Remains inpatient appropriate: Very weak and deconditioned, may need higher level of care   Procedures:    Consultants:     Antimicrobials:   Anti-infectives (From admission, onward)    Start     Dose/Rate Route Frequency Ordered Stop   07/20/23 1000  remdesivir 100 mg in sodium chloride 0.9 % 100 mL IVPB       Placed in "Followed by" Linked Group   100 mg 200 mL/hr over 30 Minutes Intravenous Daily 07/19/23 0049 07/22/23 0959   07/19/23 1000  ceFEPIme (MAXIPIME) 2 g in sodium chloride 0.9 % 100 mL IVPB        2 g 200 mL/hr over 30 Minutes Intravenous 2 times daily 07/19/23 0635     07/19/23 0230  remdesivir 200 mg in sodium chloride 0.9% 250 mL IVPB       Placed in "Followed by" Linked Group   200 mg 580 mL/hr over 30 Minutes Intravenous Once 07/19/23 0049 07/19/23 0458   07/18/23 1930  ceFEPIme (MAXIPIME) 2 g in sodium chloride 0.9 % 100 mL IVPB  Status:  Discontinued        2 g 200 mL/hr over 30 Minutes Intravenous  Once 07/18/23 1921 07/18/23 1928   07/18/23 1930  metroNIDAZOLE (FLAGYL) IVPB 500 mg        500 mg 100 mL/hr over 60 Minutes Intravenous  Once 07/18/23 1921 07/18/23 2145   07/18/23 1930  vancomycin (VANCOCIN) IVPB 1000 mg/200 mL premix  Status:  Discontinued        1,000 mg 200 mL/hr over 60 Minutes Intravenous  Once 07/18/23 1921 07/18/23 1924   07/18/23 1930  vancomycin (VANCOCIN) IVPB 1000 mg/200 mL premix  Status:  Discontinued       Placed in "Followed by" Linked Group   1,000 mg 200 mL/hr over 60 Minutes Intravenous  Once 07/18/23 1925  07/18/23 1925   07/18/23 1930  vancomycin (VANCOREADY) IVPB 500 mg/100 mL  Status:  Discontinued       Placed in "Followed by" Linked Group   500 mg 100 mL/hr over 60 Minutes Intravenous  Once 07/18/23 1925 07/18/23 1925   07/18/23 1930  ceFEPIme (MAXIPIME) 2 g in sodium chloride 0.9 % 100 mL IVPB        2 g 200 mL/hr over 30 Minutes Intravenous  Once  07/18/23 1925 07/18/23 2145   07/18/23 1930  vancomycin (VANCOREADY) IVPB 1500 mg/300 mL        1,500 mg 150 mL/hr over 120 Minutes Intravenous  Once 07/18/23 1926 07/18/23 2355          Medications  cholecalciferol  1,000 Units Oral QPM   colesevelam  625 mg Oral Daily   cyanocobalamin  500 mcg Oral Daily   enoxaparin (LOVENOX) injection  40 mg Subcutaneous Q24H   enzalutamide  80 mg Oral Daily   insulin aspart  0-20 Units Subcutaneous TID PC & HS   magnesium gluconate  500 mg Oral QHS   metoprolol tartrate  12.5 mg Oral BID   mirabegron ER  25 mg Oral Daily   tamsulosin  0.4 mg Oral Daily      Subjective:   Nicholas Burton was seen and examined today.  Very weak and deconditioned, son at the bedside.  Having secretions in the throat with chest congestion.  No fevers, nausea vomiting, abdominal pain.  Objective:   Vitals:   07/19/23 0250 07/19/23 0447 07/19/23 0842 07/19/23 1152  BP: (!) 157/77 (!) 163/69 (!) 177/86 107/65  Pulse: 75 78 96 93  Resp: 18 17 20 18   Temp: 98.1 F (36.7 C) 97.7 F (36.5 C) 98.4 F (36.9 C) 98.5 F (36.9 C)  TempSrc: Oral  Oral Oral  SpO2: 95% 99% 92% 99%  Height: 5' 9.02" (1.753 m)       Intake/Output Summary (Last 24 hours) at 07/19/2023 1235 Last data filed at 07/19/2023 1000 Gross per 24 hour  Intake 1778.35 ml  Output 2200 ml  Net -421.65 ml     Wt Readings from Last 3 Encounters:  07/13/23 79.8 kg  05/30/23 79.2 kg  02/24/23 75.1 kg     Exam General: Alert and oriented to self, very hard of hearing, ill-appearing and deconditioned Cardiovascular: S1 S2 auscultated,   RRR Respiratory: Bilateral rhonchi Gastrointestinal: Soft, nontender, nondistended, + bowel sounds Ext: 2+ pedal edema bilaterally Neuro: no acute deficits Skin: Bruises and scrapes on the lower legs Psych: has dementia, hearing deficit, appears somewhat disoriented    Data Reviewed:  I have personally reviewed following labs    CBC Lab Results  Component Value Date   WBC 11.6 (H) 07/19/2023   RBC 4.24 07/19/2023   HGB 12.3 (L) 07/19/2023   HCT 38.7 (L) 07/19/2023   MCV 91.3 07/19/2023   MCH 29.0 07/19/2023   PLT 190 07/19/2023   MCHC 31.8 07/19/2023   RDW 14.4 07/19/2023   LYMPHSABS 0.9 07/18/2023   MONOABS 1.6 (H) 07/18/2023   EOSABS 0.0 07/18/2023   BASOSABS 0.0 07/18/2023     Last metabolic panel Lab Results  Component Value Date   NA 140 07/19/2023   K 3.4 (L) 07/19/2023   CL 103 07/19/2023   CO2 27 07/19/2023   BUN 13 07/19/2023   CREATININE 1.20 07/19/2023   GLUCOSE 220 (H) 07/19/2023   GFRNONAA 56 (L) 07/19/2023   GFRAA 55 (L) 12/26/2018   CALCIUM 8.5 (L) 07/19/2023   PROT 5.9 (L) 07/18/2023   ALBUMIN 2.8 (L) 07/18/2023   BILITOT 1.0 07/18/2023   ALKPHOS 125 07/18/2023   AST 23 07/18/2023   ALT 12 07/18/2023   ANIONGAP 10 07/19/2023    CBG (last 3)  Recent Labs    07/19/23 0845 07/19/23 1208  GLUCAP 203* 224*      Coagulation Profile: Recent Labs  Lab 07/18/23 1935  INR 1.0  Radiology Studies: I have personally reviewed the imaging studies  CT Head Wo Contrast  Result Date: 07/18/2023 CLINICAL DATA:  Altered mental status EXAM: CT HEAD WITHOUT CONTRAST CT CERVICAL SPINE WITHOUT CONTRAST TECHNIQUE: Multidetector CT imaging of the head and cervical spine was performed following the standard protocol without intravenous contrast. Multiplanar CT image reconstructions of the cervical spine were also generated. RADIATION DOSE REDUCTION: This exam was performed according to the departmental dose-optimization program which includes  automated exposure control, adjustment of the mA and/or kV according to patient size and/or use of iterative reconstruction technique. COMPARISON:  10/05/2022 CT head, no prior CT cervical spine, correlation is made with 04/10/2012 cervical spine radiographs FINDINGS: Evaluation of the head and cervical spine is limited by motion. CT HEAD FINDINGS Brain: No evidence of acute infarct, hemorrhage, mass, mass effect, or midline shift. No hydrocephalus or extra-axial fluid collection. Age related cerebral atrophy. Vascular: No hyperdense vessel. Atherosclerotic calcifications in the intracranial carotid and vertebral arteries. Skull: Negative for fracture or focal lesion. Sinuses/Orbits: Complete opacification of the right maxillary sinus. Mucosal thickening in the left maxillary sinus and bilateral ethmoid air cells. Status post bilateral lens replacements. Other: The mastoid air cells are well aerated. CT CERVICAL SPINE FINDINGS Alignment: No traumatic listhesis. Mild anterolisthesis of C2 on C3, C3 on C4, and C7 on T1 Skull base and vertebrae: No acute fracture or suspicious osseous lesion. Soft tissues and spinal canal: No prevertebral fluid or swelling. No visible canal hematoma. Disc levels: Degenerative changes in the cervical spine.Limited evaluation given the degree of motion. Upper chest: No focal pulmonary opacity or pleural effusion. IMPRESSION: Evaluation of the head and cervical spine is limited by motion. Within this limitation, no acute intracranial process. No acute fracture or traumatic listhesis in the cervical spine. Electronically Signed   By: Wiliam Ke M.D.   On: 07/18/2023 22:28   CT CERVICAL SPINE WO CONTRAST  Result Date: 07/18/2023 CLINICAL DATA:  Altered mental status EXAM: CT HEAD WITHOUT CONTRAST CT CERVICAL SPINE WITHOUT CONTRAST TECHNIQUE: Multidetector CT imaging of the head and cervical spine was performed following the standard protocol without intravenous contrast. Multiplanar  CT image reconstructions of the cervical spine were also generated. RADIATION DOSE REDUCTION: This exam was performed according to the departmental dose-optimization program which includes automated exposure control, adjustment of the mA and/or kV according to patient size and/or use of iterative reconstruction technique. COMPARISON:  10/05/2022 CT head, no prior CT cervical spine, correlation is made with 04/10/2012 cervical spine radiographs FINDINGS: Evaluation of the head and cervical spine is limited by motion. CT HEAD FINDINGS Brain: No evidence of acute infarct, hemorrhage, mass, mass effect, or midline shift. No hydrocephalus or extra-axial fluid collection. Age related cerebral atrophy. Vascular: No hyperdense vessel. Atherosclerotic calcifications in the intracranial carotid and vertebral arteries. Skull: Negative for fracture or focal lesion. Sinuses/Orbits: Complete opacification of the right maxillary sinus. Mucosal thickening in the left maxillary sinus and bilateral ethmoid air cells. Status post bilateral lens replacements. Other: The mastoid air cells are well aerated. CT CERVICAL SPINE FINDINGS Alignment: No traumatic listhesis. Mild anterolisthesis of C2 on C3, C3 on C4, and C7 on T1 Skull base and vertebrae: No acute fracture or suspicious osseous lesion. Soft tissues and spinal canal: No prevertebral fluid or swelling. No visible canal hematoma. Disc levels: Degenerative changes in the cervical spine.Limited evaluation given the degree of motion. Upper chest: No focal pulmonary opacity or pleural effusion. IMPRESSION: Evaluation of the head and cervical spine is limited  by motion. Within this limitation, no acute intracranial process. No acute fracture or traumatic listhesis in the cervical spine. Electronically Signed   By: Wiliam Ke M.D.   On: 07/18/2023 22:28   DG Chest Portable 1 View  Result Date: 07/18/2023 CLINICAL DATA:  AMS Reason for exam: ams, declining mental status; best  attainable images due to pt condition and inability to following directions/hold still EXAM: PORTABLE CHEST 1 VIEW COMPARISON:  Chest x-ray 06/02/2021, CT chest 08/11/2023 FINDINGS: Slightly limited evaluation due to patient positioning. The heart and mediastinal contours are grossly unchanged. Atherosclerotic plaque No focal consolidation. No pulmonary edema. Blunting of the left costophrenic angle likely due to positioning. No pleural effusion. No pneumothorax. No acute osseous abnormality. IMPRESSION: No active disease. Slightly limited evaluation due to patient positioning. Electronically Signed   By: Tish Frederickson M.D.   On: 07/18/2023 22:23       Takyla Kuchera M.D. [facility] 07/19/2023, 12:35 PM  Available via Epic secure chat 7am-7pm After 7 pm, please refer to night coverage provider listed on amion.

## 2023-07-19 NOTE — Evaluation (Signed)
Clinical/Bedside Swallow Evaluation Patient Details  Name: AMELIO KALKOWSKI MRN: [id number] Date of Birth: 1928/07/15  Today's Date: 07/19/2023 Time: SLP Start Time (ACUTE ONLY): 1323 SLP Stop Time (ACUTE ONLY): 1345 SLP Time Calculation (min) (ACUTE ONLY): 22 min  Past Medical History:  Past Medical History:  Diagnosis Date   Allergy    ANEMIA ASSOCIATED W/OTHER [facility] NUTRITIONAL DEFIC 08/11/2010   Qualifier: Diagnosis of  By: Yetta Barre MD, Bernadene Bell.    Cancer [facility])    skin   Cataract    Chronic combined systolic and diastolic congestive heart failure (HCC) 10/30/2018   COPD with asthma (HCC) 03/02/2017   CRI (chronic renal insufficiency), stage 3 (moderate) (HCC) 10/25/2018   Deficiency anemia 10/25/2018   Diabetes mellitus    type 2   DOE (dyspnea on exertion) 09/03/2018   Edema 06/11/2009   Qualifier: Diagnosis of  By: Yetta Barre MD, Bernadene Bell.    History of skin cancer    Hyperlipidemia with target LDL less than 100 11/10/2008        Hypertension    Memory loss 05/14/2009   Obesity (BMI 30.0-34.9) 04/01/2013   Osteoarthritis    PSA elevation 10/25/2018   SKIN CANCER, HX OF 11/10/2008   Qualifier: Diagnosis of  By: Yetta Barre MD, Bernadene Bell.    Snoring 04/03/2014   Type II diabetes mellitus with manifestations (HCC) 11/10/2008   Estimated Creatinine Clearance: 38 mL/min (A) (by C-G formula based on SCr of 1.52 mg/dL (H)).    Venous stasis dermatitis of both lower extremities 01/29/2018   Vitamin D deficiency 11/10/2008   Past Surgical History:  Past Surgical History:  Procedure Laterality Date   CHOLECYSTECTOMY     INGUINAL HERNIA REPAIR     x 3   IR FLUORO GUIDED NEEDLE PLC ASPIRATION/INJECTION LOC  10/07/2022   JOINT REPLACEMENT     KNEE SURGERY     x 2   MELANOMA EXCISION     x 3 -- Left arm   TOTAL KNEE ARTHROPLASTY     x 3   HPI:  89 y.o. male presents to ED 11/19 with altered mental status. Admitted for treatment of sepsis secondary to COVID and possible UTI. CXR No active disease. Slightly  limited evaluation due to patient  positioning. PMH: dementia, prostate cancer with metastasis to bone, combined systolic and diastolic heart failure, COPD, asthma, insulin-dependent type 2 diabetes, hyperlipidemia, chronic urinary retention with indwelling catheter    Assessment / Plan / Recommendation  Clinical Impression  Pt has significant hearing loss and left ear typically better than right however he had difficulty hearing this SLP's instructions including for oral-motor exam however no deficits noted at rest. Pt's son present and denied prior dysphagia. Pt coughing prior to po's likely from Covid and coughing consistently after trials thin liquid and intermittently following puree. Coughing did appear to increase somewhat with po's and unable to determine if airway compromise present.  Recommend instrumental assessment with MBS tomorrow, continue Dys 3, thin liquids and pills whole in puree (son states they cut larger pills in half). SLP Visit Diagnosis: Dysphagia, unspecified (R13.10)    Aspiration Risk  Mild aspiration risk    Diet Recommendation Dysphagia 3 (Mech soft);Thin liquid    Liquid Administration via: Cup;Straw Medication Administration: Whole meds with puree Supervision: Staff to assist with self feeding (due to vision per son) Compensations: Small sips/bites;Slow rate Postural Changes: Seated upright at 90 degrees    Other  Recommendations Oral Care Recommendations: Oral care BID  Recommendations for follow up therapy are one component of a multi-disciplinary discharge planning process, led by the attending physician.  Recommendations may be updated based on patient status, additional functional criteria and insurance authorization.  Follow up Recommendations  (TBD)      Assistance Recommended at Discharge    Functional Status Assessment Patient has had a recent decline in their functional status and demonstrates the ability to make significant improvements in  function in a reasonable and predictable amount of time.  Frequency and Duration min 2x/week  2 weeks       Prognosis Prognosis for improved oropharyngeal function: Good Barriers to Reach Goals: Cognitive deficits;Other (Comment) (significant HOH)      Swallow Study   General Date of Onset: 07/19/23 HPI: 89 y.o. male presents to ED 11/19 with altered mental status. Admitted for treatment of sepsis secondary to COVID and possible UTI. CXR No active disease. Slightly limited evaluation due to patient  positioning. PMH: dementia, prostate cancer with metastasis to bone, combined systolic and diastolic heart failure, COPD, asthma, insulin-dependent type 2 diabetes, hyperlipidemia, chronic urinary retention with indwelling catheter Type of Study: Bedside Swallow Evaluation Previous Swallow Assessment:  (none) Diet Prior to this Study: Dysphagia 3 (mechanical soft);Thin liquids (Level 0) Temperature Spikes Noted: No Respiratory Status: Room air History of Recent Intubation: No Behavior/Cognition: Alert;Cooperative;Pleasant mood;Requires cueing;Other (Comment) (very HOH) Oral Cavity Assessment: Within Functional Limits Oral Care Completed by SLP: No Oral Cavity - Dentition:  (has partial plate- lower) Vision: Impaired for self-feeding (son states problems with vision and self feeding) Self-Feeding Abilities: Needs assist Patient Positioning: Upright in bed Baseline Vocal Quality: Normal Volitional Cough:  (could not hear SLP's command) Volitional Swallow:  (could not hear SLP's command)    Oral/Motor/Sensory Function Overall Oral Motor/Sensory Function:  (could not hear instructions due to [facility], no focal weakness observed)   Ice Chips Ice chips: Not tested   Thin Liquid Thin Liquid: Impaired Presentation: Cup;Straw Oral Phase Impairments:  (none) Pharyngeal  Phase Impairments: Cough - Immediate    Nectar Thick Nectar Thick Liquid: Not tested   Honey Thick Honey Thick Liquid: Not tested    Puree Puree: Impaired Presentation: Spoon Oral Phase Impairments:  (none) Oral Phase Functional Implications:  (none) Pharyngeal Phase Impairments: Multiple swallows;Cough - Immediate   Solid     Solid: Within functional limits      Royce Macadamia 07/19/2023,2:22 PM

## 2023-07-19 NOTE — Assessment & Plan Note (Signed)
-   Secondary to COVID-19 viral infection placed on 2 L via nasal cannula - Start IV remdesivir - Will hold on steroids at this time pending urine culture as it appears that he may also have a UTI

## 2023-07-20 ENCOUNTER — Inpatient Hospital Stay (HOSPITAL_COMMUNITY): Payer: Medicare Other

## 2023-07-20 DIAGNOSIS — I10 Essential (primary) hypertension: Secondary | ICD-10-CM | POA: Diagnosis not present

## 2023-07-20 DIAGNOSIS — Z794 Long term (current) use of insulin: Secondary | ICD-10-CM

## 2023-07-20 DIAGNOSIS — U071 COVID-19: Secondary | ICD-10-CM | POA: Diagnosis not present

## 2023-07-20 DIAGNOSIS — R4182 Altered mental status, unspecified: Secondary | ICD-10-CM | POA: Diagnosis not present

## 2023-07-20 DIAGNOSIS — E119 Type 2 diabetes mellitus without complications: Secondary | ICD-10-CM

## 2023-07-20 DIAGNOSIS — R339 Retention of urine, unspecified: Secondary | ICD-10-CM | POA: Diagnosis not present

## 2023-07-20 LAB — URINE CULTURE: Culture: NO GROWTH

## 2023-07-20 LAB — GLUCOSE, CAPILLARY
Glucose-Capillary: 138 mg/dL — ABNORMAL HIGH (ref 70–99)
Glucose-Capillary: 292 mg/dL — ABNORMAL HIGH (ref 70–99)
Glucose-Capillary: 253 mg/dL — ABNORMAL HIGH (ref 70–99)
Glucose-Capillary: 161 mg/dL — ABNORMAL HIGH (ref 70–99)

## 2023-07-20 MED ORDER — CHLORHEXIDINE GLUCONATE CLOTH 2 % EX PADS
6.0000 | MEDICATED_PAD | Freq: Every day | CUTANEOUS | Status: DC
  Administered 2023-07-20 – 2023-07-21 (×2): 6 via TOPICAL

## 2023-07-20 MED ORDER — GUAIFENESIN-DM 100-10 MG/5ML PO SYRP: 5.0000 mL | ORAL_SOLUTION | ORAL | Status: DC | PRN

## 2023-07-20 MED ORDER — BENZONATATE 100 MG PO CAPS
200.0000 mg | ORAL_CAPSULE | Freq: Three times a day (TID) | ORAL | Status: DC
  Administered 2023-07-20 – 2023-07-22 (×6): 200 mg via ORAL
  Filled 2023-07-20 (×6): qty 2

## 2023-07-20 MED ORDER — ENSURE ENLIVE PO LIQD
237.0000 mL | Freq: Two times a day (BID) | ORAL | Status: DC
  Administered 2023-07-20 – 2023-07-22 (×4): 237 mL via ORAL

## 2023-07-20 MED ORDER — ADULT MULTIVITAMIN W/MINERALS CH
1.0000 | ORAL_TABLET | Freq: Every day | ORAL | Status: DC
  Administered 2023-07-20 – 2023-07-22 (×3): 1 via ORAL
  Filled 2023-07-20 (×3): qty 1

## 2023-07-20 NOTE — Progress Notes (Signed)
Prior-To-Admission Oral Chemotherapy for Treatment of Oncologic Disease   Order noted from Dr. Isidoro Donning to continue prior-to-admission oral chemotherapy regimen of Xtandi.  Procedure Per Pharmacy & Therapeutics Committee Policy: Orders for continuation of home oral chemotherapy for treatment of an oncologic disease will be held unless approved by an oncologist during current admission.    For patients receiving oncology care at [facility], inpatient pharmacist contacts patient's oncologist during regular office hours to review. If earlier review is medically necessary, attending physician consults [facility] on-call oncologist   For patients receiving oncology care outside of [facility], attending physician consults patient's oncologist to review. If this oncologist or their coverage cannot be reached, attending physician consults [facility] on-call oncologist   Oral chemotherapy continuation order is on hold pending oncologist review, Ok to continue Xtandi per Dr Truett Perna.

## 2023-07-20 NOTE — Progress Notes (Signed)
[facility]                                                                              Nicholas Burton, is a 89 y.o. male, DOB - 1928-05-25, ONG:[id number] Admit date - 07/18/2023    Outpatient Primary MD for the patient is Etta Grandchild, MD  LOS - 1  days  Chief Complaint  Patient presents with   Altered Mental Status       Brief summary   Patient is a 89 year old male with dementia, prostate CA with metastasis to bones, combined systolic and diastolic CHF, COPD, asthma, IDDM, hyperlipidemia, chronic urinary retention with indwelling catheter presented with confusion and disorientation.  Patient also has significant hearing deficit.  Patient lives at home with his wife and reportedly fell at home at 2 AM the night before the admission and was found on the floor at the end of the bed.  On the morning of admission, patient was putting toothpaste and hand soap on his hair which is atypical.  CBG was 481 for EMS.  Patient also had " cough and cold" since the weekend.  Per son, he had more labored respiration and cough. In ED, afebrile, tachycardic HR 111, BP 136/52, O2 sats 94% on 4 L, RR 36 CBC showed leukocytosis 16.7, CMP showed creatinine 1.54, hyperglycemia with glucose 384.  UA positive for UTI.  CT head showed no acute intracranial process.  Chest x-ray negative.  COVID-19 positive  Assessment & Plan    Principal Problem:   Sepsis (HCC), POA likely due to COVID-19, UTI -Patient met sepsis criteria on admission with tachypnea, tachycardia, hypoxia, leukocytosis -Continue airborne precautions -No hypoxia, O2 sats 94% on room air -Still coughing, continue IV remdesivir -Placed on Tessalon Perles, Robitussin as needed  Active problems Acute respiratory failure with hypoxia secondary to COVID-19 -Hypoxia improving, was placed on 4 L O2 via [place] in ED, wean as tolerated -Continue IV remdesivir -O2 sats improving, 94% on room air  Acute on chronic urinary  retention -with hx of BPH -Foley catheter exchanged in ED.  [facility] -Continue tamsulosin     AKI (acute kidney injury) (HCC) - Likely secondary to dehydration and sepsis.  Has chronic indwelling Foley catheter - Creatinine elevated 1.54 on admission - Placed on fluid hydration, creatinine improved, 1.2 -Hold torsemide.  Acute cystitis -UA showed 5 ketones, large leukocytes, WBCs more than 50, no bacteria.  Prior history of Pseudomonas UTI on 01/26/2023 -Urine culture negative, discontinue IV cefepime   Insulin dependent type 2 diabetes mellitus (HCC), uncontrolled with hyperglycemia - Hemoglobin A1c 7.9 on 06/22/2023  -Outpatient on Lantus 50 units at bedtime, Jardiance 10 mg daily - Placed on resistant SSI CBG (last 3)  Recent Labs    07/19/23 2027 07/20/23 0747 07/20/23 1128  GLUCAP 124* 138* 292*   Continue Semglee 20 units daily, resistant SSI    Prostate cancer metastatic to bone [facility]) -follows with Dr. Candise Che -on daily Xtandi and q3 month Leuprolid injections   Late onset Alzheimer's dementia without behavioral disturbance (HCC) - At baseline patient is alert and oriented to self, family members  and place.  Unable to assess orientation as patient is very hard of hearing although he appears disoriented. -Delirium precautions   Essential hypertension - Controlled, not on antihypertensives   HLD (hyperlipidemia) - Recently discontinued off statin due to potential interaction with Xtandi -continue colesevelam     Fall in the hospital -Patient had a mechanical fall on 11/20 while trying to ambulate with a walker to use the restroom -Per RN, no loss of consciousness or hit his head, not in any pain  Generalized debility -Discussed in detail with the patient's son at the bedside, patient has been having recurrent falls at home, lives with his wife who is also elderly and unable to assist -PT evaluation recommended  SNF  Estimated body mass index is 25.99 kg/m as calculated from the following:   Height as of this encounter: 5\' 9"  (1.753 m).   Weight as of this encounter: 79.8 kg.  Code Status: DNR DVT Prophylaxis:  enoxaparin (LOVENOX) injection 40 mg Start: 07/19/23 1000   Level of Care: Level of care: Telemetry Medical Family Communication: Updated patient's son at bedside Disposition Plan:      Remains inpatient appropriate: SNF when completed IV remdesivir   Procedures:    Consultants:     Antimicrobials:   Anti-infectives (From admission, onward)    Start     Dose/Rate Route Frequency Ordered Stop   07/20/23 1000  remdesivir 100 mg in sodium chloride 0.9 % 100 mL IVPB       Placed in "Followed by" Linked Group   100 mg 200 mL/hr over 30 Minutes Intravenous Daily 07/19/23 0049 07/22/23 0959   07/19/23 1000  ceFEPIme (MAXIPIME) 2 g in sodium chloride 0.9 % 100 mL IVPB  Status:  Discontinued        2 g 200 mL/hr over 30 Minutes Intravenous 2 times daily 07/19/23 0635 07/20/23 1006   07/19/23 0230  remdesivir 200 mg in sodium chloride 0.9% 250 mL IVPB       Placed in "Followed by" Linked Group   200 mg 580 mL/hr over 30 Minutes Intravenous Once 07/19/23 0049 07/19/23 0458   07/18/23 1930  ceFEPIme (MAXIPIME) 2 g in sodium chloride 0.9 % 100 mL IVPB  Status:  Discontinued        2 g 200 mL/hr over 30 Minutes Intravenous  Once 07/18/23 1921 07/18/23 1928   07/18/23 1930  metroNIDAZOLE (FLAGYL) IVPB 500 mg        500 mg 100 mL/hr over 60 Minutes Intravenous  Once 07/18/23 1921 07/18/23 2145   07/18/23 1930  vancomycin (VANCOCIN) IVPB 1000 mg/200 mL premix  Status:  Discontinued        1,000 mg 200 mL/hr over 60 Minutes Intravenous  Once 07/18/23 1921 07/18/23 1924   07/18/23 1930  vancomycin (VANCOCIN) IVPB 1000 mg/200 mL premix  Status:  Discontinued       Placed in "Followed by" Linked Group   1,000 mg 200 mL/hr over 60 Minutes Intravenous  Once 07/18/23 1925 07/18/23 1925    07/18/23 1930  vancomycin (VANCOREADY) IVPB 500 mg/100 mL  Status:  Discontinued       Placed in "Followed by" Linked Group   500 mg 100 mL/hr over 60 Minutes Intravenous  Once 07/18/23 1925 07/18/23 1925   07/18/23 1930  ceFEPIme (MAXIPIME) 2 g in sodium chloride 0.9 % 100 mL IVPB        2 g 200 mL/hr over 30 Minutes Intravenous  Once 07/18/23 1925 07/18/23 2145  07/18/23 1930  vancomycin (VANCOREADY) IVPB 1500 mg/300 mL        1,500 mg 150 mL/hr over 120 Minutes Intravenous  Once 07/18/23 1926 07/18/23 2355          Medications  Chlorhexidine Gluconate Cloth  6 each Topical Daily   cholecalciferol  1,000 Units Oral QPM   colesevelam  625 mg Oral Daily   cyanocobalamin  500 mcg Oral Daily   enoxaparin (LOVENOX) injection  40 mg Subcutaneous Q24H   enzalutamide  80 mg Oral Daily   insulin aspart  0-20 Units Subcutaneous TID PC & HS   insulin glargine-yfgn  20 Units Subcutaneous QHS   magnesium gluconate  500 mg Oral QHS   metoprolol tartrate  12.5 mg Oral BID   mirabegron ER  25 mg Oral Daily   tamsulosin  0.4 mg Oral Daily      Subjective:   Matias Quraishi was seen and examined today.  Overall improving, sitting up in the chair, eating breakfast, son at the bedside.  Coughing.  No fevers nausea vomiting or abdominal pain.  On room air. Objective:   Vitals:   07/19/23 1152 07/19/23 1606 07/19/23 2022 07/20/23 0632  BP: 107/65 115/67 (!) 151/78 (!) 162/82  Pulse: 93 88 91 83  Resp: 18 18 18 18   Temp: 98.5 F (36.9 C) 99.1 F (37.3 C) 98.7 F (37.1 C) 97.9 F (36.6 C)  TempSrc: Oral Oral Oral Oral  SpO2: 99% 95% 96% 94%  Weight:      Height:        Intake/Output Summary (Last 24 hours) at 07/20/2023 1242 Last data filed at 07/20/2023 0500 Gross per 24 hour  Intake --  Output 1400 ml  Net -1400 ml     Wt Readings from Last 3 Encounters:  07/19/23 79.8 kg  07/13/23 79.8 kg  05/30/23 79.2 kg   Physical Exam General: Alert and oriented, NAD, close to his  baseline mental status, sitting up in the chair, hearing deficit Cardiovascular: S1 S2 clear, RRR.  Respiratory: CTAB, no wheezing Gastrointestinal: Soft, nontender, nondistended, NBS Ext: 1+ pedal edema bilaterally Neuro: no new deficits Psych: dementia hearing deficit    Data Reviewed:  I have personally reviewed following labs    CBC Lab Results  Component Value Date   WBC 11.6 (H) 07/19/2023   RBC 4.24 07/19/2023   HGB 12.3 (L) 07/19/2023   HCT 38.7 (L) 07/19/2023   MCV 91.3 07/19/2023   MCH 29.0 07/19/2023   PLT 190 07/19/2023   MCHC 31.8 07/19/2023   RDW 14.4 07/19/2023   LYMPHSABS 0.9 07/18/2023   MONOABS 1.6 (H) 07/18/2023   EOSABS 0.0 07/18/2023   BASOSABS 0.0 07/18/2023     Last metabolic panel Lab Results  Component Value Date   NA 140 07/19/2023   K 3.4 (L) 07/19/2023   CL 103 07/19/2023   CO2 27 07/19/2023   BUN 13 07/19/2023   CREATININE 1.20 07/19/2023   GLUCOSE 220 (H) 07/19/2023   GFRNONAA 56 (L) 07/19/2023   GFRAA 55 (L) 12/26/2018   CALCIUM 8.5 (L) 07/19/2023   PROT 5.9 (L) 07/18/2023   ALBUMIN 2.8 (L) 07/18/2023   BILITOT 1.0 07/18/2023   ALKPHOS 125 07/18/2023   AST 23 07/18/2023   ALT 12 07/18/2023   ANIONGAP 10 07/19/2023    CBG (last 3)  Recent Labs    07/19/23 2027 07/20/23 0747 07/20/23 1128  GLUCAP 124* 138* 292*      Coagulation Profile: Recent Labs  Lab 07/18/23 1935  INR 1.0     Radiology Studies: I have personally reviewed the imaging studies  CT Head Wo Contrast  Result Date: 07/18/2023 CLINICAL DATA:  Altered mental status EXAM: CT HEAD WITHOUT CONTRAST CT CERVICAL SPINE WITHOUT CONTRAST TECHNIQUE: Multidetector CT imaging of the head and cervical spine was performed following the standard protocol without intravenous contrast. Multiplanar CT image reconstructions of the cervical spine were also generated. RADIATION DOSE REDUCTION: This exam was performed according to the departmental dose-optimization  program which includes automated exposure control, adjustment of the mA and/or kV according to patient size and/or use of iterative reconstruction technique. COMPARISON:  10/05/2022 CT head, no prior CT cervical spine, correlation is made with 04/10/2012 cervical spine radiographs FINDINGS: Evaluation of the head and cervical spine is limited by motion. CT HEAD FINDINGS Brain: No evidence of acute infarct, hemorrhage, mass, mass effect, or midline shift. No hydrocephalus or extra-axial fluid collection. Age related cerebral atrophy. Vascular: No hyperdense vessel. Atherosclerotic calcifications in the intracranial carotid and vertebral arteries. Skull: Negative for fracture or focal lesion. Sinuses/Orbits: Complete opacification of the right maxillary sinus. Mucosal thickening in the left maxillary sinus and bilateral ethmoid air cells. Status post bilateral lens replacements. Other: The mastoid air cells are well aerated. CT CERVICAL SPINE FINDINGS Alignment: No traumatic listhesis. Mild anterolisthesis of C2 on C3, C3 on C4, and C7 on T1 Skull base and vertebrae: No acute fracture or suspicious osseous lesion. Soft tissues and spinal canal: No prevertebral fluid or swelling. No visible canal hematoma. Disc levels: Degenerative changes in the cervical spine.Limited evaluation given the degree of motion. Upper chest: No focal pulmonary opacity or pleural effusion. IMPRESSION: Evaluation of the head and cervical spine is limited by motion. Within this limitation, no acute intracranial process. No acute fracture or traumatic listhesis in the cervical spine. Electronically Signed   By: Wiliam Ke M.D.   On: 07/18/2023 22:28   CT CERVICAL SPINE WO CONTRAST  Result Date: 07/18/2023 CLINICAL DATA:  Altered mental status EXAM: CT HEAD WITHOUT CONTRAST CT CERVICAL SPINE WITHOUT CONTRAST TECHNIQUE: Multidetector CT imaging of the head and cervical spine was performed following the standard protocol without  intravenous contrast. Multiplanar CT image reconstructions of the cervical spine were also generated. RADIATION DOSE REDUCTION: This exam was performed according to the departmental dose-optimization program which includes automated exposure control, adjustment of the mA and/or kV according to patient size and/or use of iterative reconstruction technique. COMPARISON:  10/05/2022 CT head, no prior CT cervical spine, correlation is made with 04/10/2012 cervical spine radiographs FINDINGS: Evaluation of the head and cervical spine is limited by motion. CT HEAD FINDINGS Brain: No evidence of acute infarct, hemorrhage, mass, mass effect, or midline shift. No hydrocephalus or extra-axial fluid collection. Age related cerebral atrophy. Vascular: No hyperdense vessel. Atherosclerotic calcifications in the intracranial carotid and vertebral arteries. Skull: Negative for fracture or focal lesion. Sinuses/Orbits: Complete opacification of the right maxillary sinus. Mucosal thickening in the left maxillary sinus and bilateral ethmoid air cells. Status post bilateral lens replacements. Other: The mastoid air cells are well aerated. CT CERVICAL SPINE FINDINGS Alignment: No traumatic listhesis. Mild anterolisthesis of C2 on C3, C3 on C4, and C7 on T1 Skull base and vertebrae: No acute fracture or suspicious osseous lesion. Soft tissues and spinal canal: No prevertebral fluid or swelling. No visible canal hematoma. Disc levels: Degenerative changes in the cervical spine.Limited evaluation given the degree of motion. Upper chest: No focal pulmonary opacity or pleural effusion.  IMPRESSION: Evaluation of the head and cervical spine is limited by motion. Within this limitation, no acute intracranial process. No acute fracture or traumatic listhesis in the cervical spine. Electronically Signed   By: Wiliam Ke M.D.   On: 07/18/2023 22:28   DG Chest Portable 1 View  Result Date: 07/18/2023 CLINICAL DATA:  AMS Reason for exam:  ams, declining mental status; best attainable images due to pt condition and inability to following directions/hold still EXAM: PORTABLE CHEST 1 VIEW COMPARISON:  Chest x-ray 06/02/2021, CT chest 08/11/2023 FINDINGS: Slightly limited evaluation due to patient positioning. The heart and mediastinal contours are grossly unchanged. Atherosclerotic plaque No focal consolidation. No pulmonary edema. Blunting of the left costophrenic angle likely due to positioning. No pleural effusion. No pneumothorax. No acute osseous abnormality. IMPRESSION: No active disease. Slightly limited evaluation due to patient positioning. Electronically Signed   By: Tish Frederickson M.D.   On: 07/18/2023 22:23       Brilynn Biasi M.D. [facility] 07/20/2023, 12:42 PM  Available via Epic secure chat 7am-7pm After 7 pm, please refer to night coverage provider listed on amion.

## 2023-07-20 NOTE — Evaluation (Signed)
Modified Barium Swallow Study  Patient Details  Name: MANU LANDAVAZO MRN: [id number] Date of Birth: [birth year]  Today's Date: 07/20/2023  Modified Barium Swallow completed.  Full report located under Chart Review in the Imaging Section.  History of Present Illness 89 y.o. male presents to ED 11/19 with altered mental status. Admitted for treatment of sepsis secondary to COVID and possible UTI. CXR No active disease. Slightly limited evaluation due to patient  positioning. PMH: dementia, prostate cancer with metastasis to bone, combined systolic and diastolic heart failure, COPD, asthma, insulin-dependent type 2 diabetes, hyperlipidemia, chronic urinary retention with indwelling catheter, very HOH.    Clinical Impression Pt presents with a functional oropharyngeal swallow marked by only occasional transient penetration of thin liquids (Penetration/aspiration scale score of 2, considered WFL). There was no aspiration over multiple swallows of various consistencies. There was adequate pharyngeal squeeze and base of tongue retraction, clearing the barium and leaving only trace residue.  Notable was esophageal retention with retrograde flow below pharyngoesophageal segment (PES). Recommend regular solids; thin liquids; give meds whole in puree.  Sit upright after meals for 30-45 minutes. No SLP f/u is needed. Factors that may increase risk of adverse event in presence of aspiration Rubye [employer] Clearance Coots 2021):   N/a   Swallow Evaluation Recommendations Recommendations: PO diet PO Diet Recommendation: regular solids;Thin liquids (Level 0) Liquid Administration via: Cup;Straw Medication Administration: Whole meds with puree Supervision: Patient able to self-feed Oral care recommendations: Oral care BID (2x/day)    Luca Dyar L. Samson Frederic, MA CCC/SLP Clinical Specialist - Acute Care SLP Acute Rehabilitation Services Office number [phone]   Blenda Mounts Laurice 07/20/2023,2:49 PM

## 2023-07-20 NOTE — Progress Notes (Signed)
Initial Nutrition Assessment  DOCUMENTATION CODES:   Not applicable  INTERVENTION:  Continue with DYS3 diet Ensure Plus High Protein po BID, each supplement provides 350 kcal and 20 grams of protein. Daily MVM   NUTRITION DIAGNOSIS:   Increased nutrient needs related to acute illness as evidenced by estimated needs.    GOAL:   Patient will meet greater than or equal to 90% of their needs    MONITOR:   PO intake, Supplement acceptance, Weight trends, Labs  REASON FOR ASSESSMENT:   Diagnosis (Sepsis)    ASSESSMENT:   89 y.o. M, Admitted from home where he lives with his wife with an admitting dx of sepsis. Medical history significant of dementia, prostate cancer with metastasis to bone, combined systolic and diastolic heart failure, COPD, asthma, insulin-dependent type 2 diabetes, hyperlipidemia, chronic urinary retention with indwelling catheter.  Pt was treated empirically for sepsis with IV vancomycin, cefepime and Flagyl. COVID lab resulted positive. NFPE revealed moderate to mild fat and muscle depletion although suspect age related instead of nutrition related to due to good oral intake, independent feeding ability and weight stability.  Patient stated he felt as though he had a good appetite.  Does not feel as though he has lost any weight.  NKFA reported. No family at bed side at time of visit.  Patient hard of hearing and it was difficult to get him to answer question or engage in full conversation. RN reports good appetite and he drinks a lot of fluid.  Admit weight: 79.8 kg Current weight: 79.8 kg Weight history:   07/19/23 79.8 kg  07/13/23 79.8 kg  05/30/23 79.2 kg  02/24/23 75.1 kg  02/20/23 75.3 kg  01/25/23 75.3 kg  01/17/23 75.3 kg  11/28/22 74.8 kg  11/15/22 75 kg  10/27/22 74.1 kg     Average Meal Intake 50% of breakfast per [id number]  Nutritionally Relevant Medications: Scheduled Meds:  cholecalciferol  1,000 Units Oral QPM   colesevelam   625 mg Oral Daily   cyanocobalamin  500 mcg Oral Daily   magnesium gluconate  500 mg Oral QHS   mirabegron ER  25 mg Oral Daily    Labs Reviewed:  HgbA1c 7.9 (06/22/2023)    NUTRITION - FOCUSED PHYSICAL EXAM:  Flowsheet Row Most Recent Value  Orbital Region Mild depletion  Upper Arm Region Mild depletion  Thoracic and Lumbar Region Mild depletion  Buccal Region Mild depletion  Temple Region No depletion  Clavicle Bone Region No depletion  Clavicle and Acromion Bone Region No depletion  Scapular Bone Region Unable to assess  Dorsal Hand Mild depletion  Patellar Region No depletion  Anterior Thigh Region No depletion  Posterior Calf Region No depletion  Edema (RD Assessment) Moderate  Hair Reviewed  Eyes Reviewed  Mouth Reviewed  Skin Reviewed  Nails Reviewed       Diet Order:   Diet Order             DIET DYS 3 Room service appropriate? Yes; Fluid consistency: Thin  Diet effective now                   EDUCATION NEEDS:   Not appropriate for education at this time  Skin:  Skin Assessment: Reviewed RN Assessment  Last BM:  07/19/23  Height:   Ht Readings from Last 1 Encounters:  07/19/23 5\' 9"  (1.753 m)    Weight:   Wt Readings from Last 1 Encounters:  07/19/23 79.8 kg    Ideal  Body Weight:     BMI:  Body mass index is 25.99 kg/m.  Estimated Nutritional Needs:   Kcal:  2400-2600 kcal/d  Protein:  105-120 g/d  Fluid:  79ml/kcal    Jamelle Haring RDN, LDN Clinical Dietitian  RDN pager # available on Amion

## 2023-07-20 NOTE — Progress Notes (Signed)
Urine culture neg. Ok to stop cefepime per Dr Isidoro Donning.  Ulyses Southward, PharmD, BCIDP, AAHIVP, CPP Infectious Disease Pharmacist 07/20/2023 10:08 AM

## 2023-07-20 NOTE — Progress Notes (Signed)
Occupational Therapy Treatment Patient Details Name: Nicholas Burton MRN: [id number] DOB: [birth year] Today's Date: 07/20/2023   History of present illness 89 y.o. male presents to ED 11/19 with altered mental status. Admitted for treatment of sepsis secondary to COVID and possible UTI WUJ:[id number], prostate cancer with metastasis to bone, combined systolic and diastolic heart failure, COPD, asthma, insulin-dependent type 2 diabetes, hyperlipidemia, chronic urinary retention with indwelling catheter   OT comments  Pt with increased confusion. Unable to elicit participation in ADLs or OOB despite multimodal cues of both son and OT. Educated son in delirium prevention. Will continue efforts. Patient will benefit from continued inpatient follow up therapy, <3 hours/day.      If plan is discharge home, recommend the following:  A little help with walking and/or transfers;A lot of help with bathing/dressing/bathroom;Assistance with cooking/housework;Direct supervision/assist for medications management;Direct supervision/assist for financial management;Assist for transportation;Help with stairs or ramp for entrance   Equipment Recommendations  None recommended by OT    Recommendations for Other Services      Precautions / Restrictions Precautions Precautions: Fall Restrictions Weight Bearing Restrictions: No       Mobility Bed Mobility               General bed mobility comments: refused, despite max encouragement and multimodal cues by OT and son    Transfers                         Balance                                           ADL either performed or assessed with clinical judgement   ADL Overall ADL's : Needs assistance/impaired       Grooming Details (indicate cue type and reason): attempted placing toothbrush and denture cup in pt's hand to elicit participation in oral care, as well as hand over hand assist                                     Extremity/Trunk Assessment              Vision       Perception     Praxis      Cognition Arousal: Alert Behavior During Therapy: Flat affect Overall Cognitive Status: Impaired/Different from baseline Area of Impairment: Orientation, Memory, Following commands                 Orientation Level: Disoriented to, Place, Time, Situation   Memory: Decreased Beers-term memory Following Commands:  (not following commands)       General Comments: pt very confused, son in room and attempting to orient pt and elicit participation, pt asking to "call the police" and "call the surgeon"        Exercises      Shoulder Instructions       General Comments      Pertinent Vitals/ Pain       Pain Assessment Pain Assessment: Faces Faces Pain Scale: No hurt  Home Living                                          Prior Functioning/Environment  Frequency  Min 1X/week        Progress Toward Goals  OT Goals(current goals can now be found in the care plan section)  Progress towards OT goals: Not progressing toward goals - comment  Acute Rehab OT Goals OT Goal Formulation: With family Time For Goal Achievement: 08/02/23 Potential to Achieve Goals: Fair  Plan      Co-evaluation                 AM-PAC OT "6 Clicks" Daily Activity     Outcome Measure   Help from another person eating meals?: Total Help from another person taking care of personal grooming?: Total Help from another person toileting, which includes using toliet, bedpan, or urinal?: Total Help from another person bathing (including washing, rinsing, drying)?: Total Help from another person to put on and taking off regular upper body clothing?: Total Help from another person to put on and taking off regular lower body clothing?: Total 6 Click Score: 6    End of Session    OT Visit Diagnosis: Unsteadiness on feet (R26.81);Other  abnormalities of gait and mobility (R26.89);Muscle weakness (generalized) (M62.81)   Activity Tolerance Patient tolerated treatment well   Patient Left in bed;with call bell/phone within reach;with bed alarm set;with family/visitor present   Nurse Communication          Time: 1324-4010 OT Time Calculation (min): 25 min  Charges: OT General Charges $OT Visit: 1 Visit OT Treatments $Self Care/Home Management : 23-37 mins  Berna Spare, OTR/L Acute Rehabilitation Services Office: [phone]   Evern Bio 07/20/2023, 11:55 AM

## 2023-07-20 NOTE — Progress Notes (Signed)
Mobility Specialist Progress Note:   07/20/23 1027  Mobility  Activity Ambulated with assistance to bathroom  Level of Assistance Contact guard assist, steadying assist  Assistive Device Front wheel walker  Distance Ambulated (ft) 12 ft  Activity Response Tolerated well  Mobility Referral Yes  $Mobility charge 1 Mobility  Mobility Specialist Start Time (ACUTE ONLY) 1015  Mobility Specialist Stop Time (ACUTE ONLY) 1027  Mobility Specialist Time Calculation (min) (ACUTE ONLY) 12 min   Pt requested assistance from C> Bathroom. Required MinG with RW to ambulate. Tolerated well, very HOH. Returned pt to bed, all needs met, RN in room.   Feliciana Rossetti Mobility Specialist Please contact via Special educational needs teacher or  Rehab office at [phone]

## 2023-07-21 DIAGNOSIS — R4182 Altered mental status, unspecified: Secondary | ICD-10-CM | POA: Diagnosis not present

## 2023-07-21 DIAGNOSIS — R339 Retention of urine, unspecified: Secondary | ICD-10-CM | POA: Diagnosis not present

## 2023-07-21 DIAGNOSIS — U071 COVID-19: Secondary | ICD-10-CM | POA: Diagnosis not present

## 2023-07-21 DIAGNOSIS — I10 Essential (primary) hypertension: Secondary | ICD-10-CM | POA: Diagnosis not present

## 2023-07-21 LAB — GLUCOSE, CAPILLARY
Glucose-Capillary: 155 mg/dL — ABNORMAL HIGH (ref 70–99)
Glucose-Capillary: 187 mg/dL — ABNORMAL HIGH (ref 70–99)
Glucose-Capillary: 194 mg/dL — ABNORMAL HIGH (ref 70–99)
Glucose-Capillary: 66 mg/dL — ABNORMAL LOW (ref 70–99)

## 2023-07-21 MED ORDER — INSULIN GLARGINE-YFGN 100 UNIT/ML ~~LOC~~ SOLN
25.0000 [IU] | Freq: Every day | SUBCUTANEOUS | Status: DC
  Filled 2023-07-21 (×2): qty 0.25

## 2023-07-21 NOTE — Progress Notes (Signed)
[facility]                                                                              Nicholas Burton, is a 89 y.o. male, DOB - 1928-01-05, EAV:[id number] Admit date - 07/18/2023    Outpatient Primary MD for the patient is Etta Grandchild, MD  LOS - 2  days  Chief Complaint  Patient presents with   Altered Mental Status       Brief summary   Patient is a 89 year old male with dementia, prostate CA with metastasis to bones, combined systolic and diastolic CHF, COPD, asthma, IDDM, hyperlipidemia, chronic urinary retention with indwelling catheter presented with confusion and disorientation.  Patient also has significant hearing deficit.  Patient lives at home with his wife and reportedly fell at home at 2 AM the night before the admission and was found on the floor at the end of the bed.  On the morning of admission, patient was putting toothpaste and hand soap on his hair which is atypical.  CBG was 481 for EMS.  Patient also had " cough and cold" since the weekend.  Per son, he had more labored respiration and cough. In ED, afebrile, tachycardic HR 111, BP 136/52, O2 sats 94% on 4 L, RR 36 CBC showed leukocytosis 16.7, CMP showed creatinine 1.54, hyperglycemia with glucose 384.  UA positive for UTI.  CT head showed no acute intracranial process.  Chest x-ray negative.  COVID-19 positive  Assessment & Plan    Principal Problem:   Sepsis (HCC), POA likely due to COVID-19, UTI -Patient met sepsis criteria on admission with tachypnea, tachycardia, hypoxia, leukocytosis -Continue airborne precautions -No hypoxia, O2 sats 97% on room air -Completed IV remdesivir today -Continue Tessalon Perles, Robitussin  Active problems Acute respiratory failure with hypoxia secondary to COVID-19 -Hypoxia improving, was placed on 4 L O2 via [place] in ED -Completed IV remdesivir today -On room air  Acute on chronic urinary retention -with hx of BPH -Foley catheter exchanged in ED.   [facility] -Continue tamsulosin     AKI (acute kidney injury) (HCC) - Likely secondary to dehydration and sepsis.  Has chronic indwelling Foley catheter - Creatinine elevated 1.54 on admission - Placed on fluid hydration, creatinine improved, 1.2 -Hold torsemide.  Acute cystitis -UA showed 5 ketones, large leukocytes, WBCs more than 50, no bacteria.  Prior history of Pseudomonas UTI on 01/26/2023 -Urine culture negative, discontinue IV cefepime   Insulin dependent type 2 diabetes mellitus (HCC), uncontrolled with hyperglycemia - Hemoglobin A1c 7.9 on 06/22/2023  -Outpatient on Lantus 50 units at bedtime, Jardiance 10 mg daily - Placed on resistant SSI CBG (last 3)  Recent Labs    07/20/23 2116 07/21/23 0735 07/21/23 1132  GLUCAP 161* 155* 187*   -Increase Semglee to 25 units daily at bedtime, continue resistant SSI    Prostate cancer metastatic to bone [facility]) -follows with Dr. Candise Che -on daily Xtandi and q3 month Leuprolid injections   Late onset Alzheimer's dementia without behavioral disturbance (HCC) - At baseline patient is alert and oriented to self, family members and place.  Unable to assess  orientation as patient is very hard of hearing although he appears disoriented. -Delirium precautions -Appears somewhat more confused today, sundowning   Essential hypertension - Controlled, not on antihypertensives   HLD (hyperlipidemia) - Recently discontinued off statin due to potential interaction with Xtandi -continue colesevelam     Fall in the hospital -Patient had a mechanical fall on 11/20 while trying to ambulate with a walker to use the restroom -Per RN, no loss of consciousness or hit his head, not in any pain  Generalized debility -Discussed in detail with the patient's son at the bedside, patient has been having recurrent falls at home, lives with his wife who is also elderly and unable to assist -PT evaluation recommended  SNF  Estimated body mass index is 25.99 kg/m as calculated from the following:   Height as of this encounter: 5\' 9"  (1.753 m).   Weight as of this encounter: 79.8 kg.  Code Status: DNR DVT Prophylaxis:  enoxaparin (LOVENOX) injection 40 mg Start: 07/19/23 1000   Level of Care: Level of care: Telemetry Medical Family Communication: Updated patient's son at bedside Disposition Plan:      Remains inpatient appropriate: SNF when bed available   Procedures:    Consultants:     Antimicrobials:   Anti-infectives (From admission, onward)    Start     Dose/Rate Route Frequency Ordered Stop   07/20/23 1000  remdesivir 100 mg in sodium chloride 0.9 % 100 mL IVPB       Placed in "Followed by" Linked Group   100 mg 200 mL/hr over 30 Minutes Intravenous Daily 07/19/23 0049 07/21/23 1113   07/19/23 1000  ceFEPIme (MAXIPIME) 2 g in sodium chloride 0.9 % 100 mL IVPB  Status:  Discontinued        2 g 200 mL/hr over 30 Minutes Intravenous 2 times daily 07/19/23 0635 07/20/23 1006   07/19/23 0230  remdesivir 200 mg in sodium chloride 0.9% 250 mL IVPB       Placed in "Followed by" Linked Group   200 mg 580 mL/hr over 30 Minutes Intravenous Once 07/19/23 0049 07/19/23 0458   07/18/23 1930  ceFEPIme (MAXIPIME) 2 g in sodium chloride 0.9 % 100 mL IVPB  Status:  Discontinued        2 g 200 mL/hr over 30 Minutes Intravenous  Once 07/18/23 1921 07/18/23 1928   07/18/23 1930  metroNIDAZOLE (FLAGYL) IVPB 500 mg        500 mg 100 mL/hr over 60 Minutes Intravenous  Once 07/18/23 1921 07/18/23 2145   07/18/23 1930  vancomycin (VANCOCIN) IVPB 1000 mg/200 mL premix  Status:  Discontinued        1,000 mg 200 mL/hr over 60 Minutes Intravenous  Once 07/18/23 1921 07/18/23 1924   07/18/23 1930  vancomycin (VANCOCIN) IVPB 1000 mg/200 mL premix  Status:  Discontinued       Placed in "Followed by" Linked Group   1,000 mg 200 mL/hr over 60 Minutes Intravenous  Once 07/18/23 1925 07/18/23 1925   07/18/23  1930  vancomycin (VANCOREADY) IVPB 500 mg/100 mL  Status:  Discontinued       Placed in "Followed by" Linked Group   500 mg 100 mL/hr over 60 Minutes Intravenous  Once 07/18/23 1925 07/18/23 1925   07/18/23 1930  ceFEPIme (MAXIPIME) 2 g in sodium chloride 0.9 % 100 mL IVPB        2 g 200 mL/hr over 30 Minutes Intravenous  Once 07/18/23 1925 07/18/23 2145  07/18/23 1930  vancomycin (VANCOREADY) IVPB 1500 mg/300 mL        1,500 mg 150 mL/hr over 120 Minutes Intravenous  Once 07/18/23 1926 07/18/23 2355          Medications  benzonatate  200 mg Oral TID   Chlorhexidine Gluconate Cloth  6 each Topical Daily   cholecalciferol  1,000 Units Oral QPM   colesevelam  625 mg Oral Daily   cyanocobalamin  500 mcg Oral Daily   enoxaparin (LOVENOX) injection  40 mg Subcutaneous Q24H   enzalutamide  80 mg Oral Daily   feeding supplement  237 mL Oral BID BM   insulin aspart  0-20 Units Subcutaneous TID PC & HS   insulin glargine-yfgn  20 Units Subcutaneous QHS   magnesium gluconate  500 mg Oral QHS   metoprolol tartrate  12.5 mg Oral BID   mirabegron ER  25 mg Oral Daily   multivitamin with minerals  1 tablet Oral Daily   tamsulosin  0.4 mg Oral Daily      Subjective:   Nicholas Burton was seen and examined today.  Son at the bedside, patient appears somewhat more confused today, with sundowning.  No fevers, chills, O2 sats 97% on room air   Objective:   Vitals:   07/20/23 2004 07/20/23 2359 07/21/23 0450 07/21/23 0830  BP: (!) 153/93 (!) 154/83 (!) 184/85 (!) 163/96  Pulse: 90 82 91 94  Resp:    19  Temp: 99.5 F (37.5 C) (!) 97.4 F (36.3 C) 98.3 F (36.8 C) 98.2 F (36.8 C)  TempSrc: Oral Oral Oral Oral  SpO2: 97% 93% 92% 97%  Weight:      Height:        Intake/Output Summary (Last 24 hours) at 07/21/2023 1329 Last data filed at 07/21/2023 0557 Gross per 24 hour  Intake --  Output 2700 ml  Net -2700 ml     Wt Readings from Last 3 Encounters:  07/19/23 79.8 kg   07/13/23 79.8 kg  05/30/23 79.2 kg    Physical Exam General: Alert and awake, NAD, somewhat confused today Cardiovascular: S1 S2 clear, RRR.  Respiratory: CTAB, no wheezing Gastrointestinal: Soft, nontender, nondistended, NBS Ext: no pedal edema bilaterally Neuro: no new deficits Psych: confused, sundowning, hearing deficit     Data Reviewed:  I have personally reviewed following labs    CBC Lab Results  Component Value Date   WBC 11.6 (H) 07/19/2023   RBC 4.24 07/19/2023   HGB 12.3 (L) 07/19/2023   HCT 38.7 (L) 07/19/2023   MCV 91.3 07/19/2023   MCH 29.0 07/19/2023   PLT 190 07/19/2023   MCHC 31.8 07/19/2023   RDW 14.4 07/19/2023   LYMPHSABS 0.9 07/18/2023   MONOABS 1.6 (H) 07/18/2023   EOSABS 0.0 07/18/2023   BASOSABS 0.0 07/18/2023     Last metabolic panel Lab Results  Component Value Date   NA 140 07/19/2023   K 3.4 (L) 07/19/2023   CL 103 07/19/2023   CO2 27 07/19/2023   BUN 13 07/19/2023   CREATININE 1.20 07/19/2023   GLUCOSE 220 (H) 07/19/2023   GFRNONAA 56 (L) 07/19/2023   GFRAA 55 (L) 12/26/2018   CALCIUM 8.5 (L) 07/19/2023   PROT 5.9 (L) 07/18/2023   ALBUMIN 2.8 (L) 07/18/2023   BILITOT 1.0 07/18/2023   ALKPHOS 125 07/18/2023   AST 23 07/18/2023   ALT 12 07/18/2023   ANIONGAP 10 07/19/2023    CBG (last 3)  Recent Labs  07/20/23 2116 07/21/23 0735 07/21/23 1132  GLUCAP 161* 155* 187*      Coagulation Profile: Recent Labs  Lab 07/18/23 1935  INR 1.0     Radiology Studies: I have personally reviewed the imaging studies  DG Swallowing Func-Speech Pathology  Result Date: 07/20/2023 Table formatting from the original result was not included. Modified Barium Swallow Study Patient Details Name: Nicholas Burton MRN: [id number] Date of Birth: July 06, 1928 Today's Date: 07/20/2023 HPI/PMH: HPI: 89 y.o. male presents to ED 11/19 with altered mental status. Admitted for treatment of sepsis secondary to COVID and possible UTI. CXR No active  disease. Slightly limited evaluation due to patient  positioning. PMH: dementia, prostate cancer with metastasis to bone, combined systolic and diastolic heart failure, COPD, asthma, insulin-dependent type 2 diabetes, hyperlipidemia, chronic urinary retention with indwelling catheter Clinical Impression: Clinical Impression: Pt presents with a functional oropharyngeal swallow marked by only occasional transient penetration of thin liquids (Penetration/aspiration scale score of 2, considered WFL). There was no aspiration over multiple swallows of various consistencies. There was adequate pharyngeal squeeze and base of tongue retraction, clearing the barium and leaving only trace residue.  Notable was esophageal retention with retrograde flow below pharyngoesophageal segment (PES). Recommend regular solids; thin liquids; give meds whole in puree.  Sit upright after meals for 30-45 minutes. No SLP f/u is needed. Factors that may increase risk of adverse event in presence of aspiration Rubye Oaks [employer] Clearance Coots 2021): No data recorded Recommendations/Plan: Swallowing Evaluation Recommendations Swallowing Evaluation Recommendations Recommendations: PO diet PO Diet Recommendation: Thin liquids (Level 0); Regular Liquid Administration via: Cup; Straw Medication Administration: Whole meds with puree Supervision: Patient able to self-feed Oral care recommendations: Oral care BID (2x/day) Treatment Plan Treatment Plan Treatment recommendations: No treatment recommended at this time Follow-up recommendations: No SLP follow up Recommendations Recommendations for follow up therapy are one component of a multi-disciplinary discharge planning process, led by the attending physician.  Recommendations may be updated based on patient status, additional functional criteria and insurance authorization. Assessment: Orofacial Exam: Orofacial Exam Oral Cavity: Oral Hygiene: WFL Oral Cavity - Dentition: Adequate natural dentition Orofacial  Anatomy: WFL Oral Motor/Sensory Function: WFL Anatomy: Anatomy: Prominent cricopharyngeus; Suspected cervical osteophytes Boluses Administered: Boluses Administered Boluses Administered: Thin liquids (Level 0); Moderately thick liquids (Level 3, honey thick); Mildly thick liquids (Level 2, nectar thick); Puree; Solid  Oral Impairment Domain: Oral Impairment Domain Lip Closure: No labial escape Tongue control during bolus hold: Not tested Bolus preparation/mastication: Timely and efficient chewing and mashing Bolus transport/lingual motion: Brisk tongue motion Oral residue: Trace residue lining oral structures Location of oral residue : Tongue Initiation of pharyngeal swallow : Posterior laryngeal surface of the epiglottis  Pharyngeal Impairment Domain: Pharyngeal Impairment Domain Soft palate elevation: No bolus between soft palate (SP)/pharyngeal wall (PW) Laryngeal elevation: Complete superior movement of thyroid cartilage with complete approximation of arytenoids to epiglottic petiole Anterior hyoid excursion: Complete anterior movement Epiglottic movement: Complete inversion Laryngeal vestibule closure: Incomplete, narrow column air/contrast in laryngeal vestibule Pharyngeal stripping wave : Present - complete Pharyngeal contraction (A/P view only): N/A Pharyngoesophageal segment opening: Partial distention/partial duration, partial obstruction of flow Tongue base retraction: Trace column of contrast or air between tongue base and PPW Pharyngeal residue: Trace residue within or on pharyngeal structures Location of pharyngeal residue: Valleculae  Esophageal Impairment Domain: Esophageal Impairment Domain Esophageal clearance upright position: Esophageal retention with retrograde flow below pharyngoesophageal segment (PES) Pill: Pill Consistency administered: -- (NT) Penetration/Aspiration Scale Score: Penetration/Aspiration Scale Score 1.  Material does  not enter airway: Mildly thick liquids (Level 2, nectar  thick); Moderately thick liquids (Level 3, honey thick); Puree; Solid 2.  Material enters airway, remains ABOVE vocal cords then ejected out: Mildly thick liquids (Level 2, nectar thick) Compensatory Strategies: Compensatory Strategies Compensatory strategies: Yes Straw: Effective   General Information: Caregiver present: No  Diet Prior to this Study: Dysphagia 3 (mechanical soft); Thin liquids (Level 0)   Temperature : Normal   Respiratory Status: WFL   Supplemental O2: None (Room air)   History of Recent Intubation: No  Behavior/Cognition: Alert; Cooperative No data recorded Baseline vocal quality/speech: Normal Volitional Cough: -- (unable to understand instructions with) Volitional Swallow: Able to elicit Exam Limitations: No limitations Goal Planning: Prognosis for improved oropharyngeal function: Good No data recorded No data recorded No data recorded No data recorded Pain: Pain Assessment Pain Assessment: Faces Pain Score: 0 Faces Pain Scale: 0 End of Session: Start Time:SLP Start Time (ACUTE ONLY): 1405 Stop Time: SLP Stop Time (ACUTE ONLY): 1425 Time Calculation:SLP Time Calculation (min) (ACUTE ONLY): 20 min Charges: SLP Evaluations $ SLP Speech Visit: 1 Visit SLP Evaluations $BSS Swallow: 1 Procedure $MBS Swallow: 1 Procedure SLP visit diagnosis: SLP Visit Diagnosis: Dysphagia, unspecified (R13.10) Past Medical History: Past Medical History: Diagnosis Date  Allergy   ANEMIA ASSOCIATED W/OTHER [facility] NUTRITIONAL DEFIC 08/11/2010  Qualifier: Diagnosis of  By: Yetta Barre MD, Bernadene Bell.   Cancer [facility])   skin  Cataract   Chronic combined systolic and diastolic congestive heart failure (HCC) 10/30/2018  COPD with asthma (HCC) 03/02/2017  CRI (chronic renal insufficiency), stage 3 (moderate) (HCC) 10/25/2018  Deficiency anemia 10/25/2018  Diabetes mellitus   type 2  DOE (dyspnea on exertion) 09/03/2018  Edema 06/11/2009  Qualifier: Diagnosis of  By: Yetta Barre MD, Bernadene Bell.   History of skin cancer   Hyperlipidemia with target LDL  less than 100 11/10/2008      Hypertension   Memory loss 05/14/2009  Obesity (BMI 30.0-34.9) 04/01/2013  Osteoarthritis   PSA elevation 10/25/2018  SKIN CANCER, HX OF 11/10/2008  Qualifier: Diagnosis of  By: Yetta Barre MD, Bernadene Bell.   Snoring 04/03/2014  Type II diabetes mellitus with manifestations (HCC) 11/10/2008  Estimated Creatinine Clearance: 38 mL/min (A) (by C-G formula based on SCr of 1.52 mg/dL (H)).   Venous stasis dermatitis of both lower extremities 01/29/2018  Vitamin D deficiency 11/10/2008 Past Surgical History: Past Surgical History: Procedure Laterality Date  CHOLECYSTECTOMY    INGUINAL HERNIA REPAIR    x 3  IR FLUORO GUIDED NEEDLE PLC ASPIRATION/INJECTION LOC  10/07/2022  JOINT REPLACEMENT    KNEE SURGERY    x 2  MELANOMA EXCISION    x 3 -- Left arm  TOTAL KNEE ARTHROPLASTY    x 3 Blenda Mounts Laurice 07/20/2023, 2:51 PM      Kaylah Chiasson M.D. [facility] 07/21/2023, 1:29 PM  Available via Epic secure chat 7am-7pm After 7 pm, please refer to night coverage provider listed on amion.

## 2023-07-21 NOTE — Progress Notes (Signed)
Mobility Specialist Progress Note:   07/21/23 1021  Mobility  Activity Ambulated with assistance in room;Transferred from bed to chair  Level of Assistance Contact guard assist, steadying assist  Assistive Device Front wheel walker  Distance Ambulated (ft) 50 ft  Activity Response Tolerated well  Mobility Referral Yes  $Mobility charge 1 Mobility  Mobility Specialist Start Time (ACUTE ONLY) 1010  Mobility Specialist Stop Time (ACUTE ONLY) 1021  Mobility Specialist Time Calculation (min) (ACUTE ONLY) 11 min   Responded to pt bed alarm, confused, limited vision, and very HOH. Attempting to get OOB with rails up. Impulsive to ambulate in room, afterwards assisted pt to chair with MinG and RW. Pt required redirecting multiple times with loud verbal cues and motions. Pt sitting comfortably in chair, alarm on, call bell in reach, all needs met.   Feliciana Rossetti Mobility Specialist Please contact via Special educational needs teacher or  Rehab office at [phone]

## 2023-07-21 NOTE — TOC Progression Note (Signed)
Transition of Care [facility]) - Progression Note    Patient Details  Name: Nicholas Burton MRN: [id number] Date of Birth: 22-Jun-1928  Transition of Care [facility]) CM/SW Contact  Janae Bridgeman, RN Phone Number: 07/21/2023, 3:02 PM  Clinical Narrative:    Cm called and spoke with the patient's son, [name] by phone and the son states that the family would like the patient admitted to [facility].  [facility], MSw updated and will follow up with the family.        Expected Discharge Plan and Services                                               Social Determinants of Health (SDOH) Interventions SDOH Screenings   Food Insecurity: No Food Insecurity (07/19/2023)  Housing: Low Risk  (07/19/2023)  Transportation Needs: No Transportation Needs (07/19/2023)  Utilities: Not At Risk (07/19/2023)  Alcohol Screen: Low Risk  (07/13/2023)  Depression (PHQ2-9): Low Risk  (10/27/2022)  Financial Resource Strain: Low Risk  (07/13/2023)  Physical Activity: Sufficiently Active (07/13/2023)  Social Connections: Moderately Isolated (07/13/2023)  Stress: Patient Unable To Answer (07/13/2023)  Tobacco Use: Low Risk  (07/18/2023)  Health Literacy: Inadequate Health Literacy (07/13/2023)    Readmission Risk Interventions     No data to display

## 2023-07-21 NOTE — Progress Notes (Signed)
Physical Therapy Treatment Patient Details Name: Nicholas Burton MRN: [id number] DOB: [birth year] Today's Date: 07/21/2023   History of Present Illness 89 y.o. male presents to ED 11/19 with altered mental status. Admitted for treatment of sepsis secondary to COVID and possible UTI PIR:JJOACZYS, prostate cancer with metastasis to bone, combined systolic and diastolic heart failure, COPD, asthma, insulin-dependent type 2 diabetes, hyperlipidemia, chronic urinary retention with indwelling catheter    PT Comments  Pt received in supine and confused, but pleasant. Pt unable to hear commands given throughout session, however is able to follow most visual and tactile cues with increased time. Pt able to stand and ambulate Carillo distance in the room with up to min A. Pt reporting fatigue and sitting in the recliner for rest break. Pt able to stand and pivot to bed with increased cues for direction. Pt able to lateral scoot towards HOB with good BUE support and no physical assist. Pt demonstrates improved mobility this session, however continues to be limited by quick fatigue, impaired cognition, and HoH. Pt continues to benefit from PT services to progress toward functional mobility goals.     If plan is discharge home, recommend the following: A little help with walking and/or transfers;A lot of help with bathing/dressing/bathroom;Assistance with cooking/housework;Direct supervision/assist for medications management;Direct supervision/assist for financial management;Assist for transportation;Help with stairs or ramp for entrance;Supervision due to cognitive status   Can travel by private vehicle        Equipment Recommendations  None recommended by PT    Recommendations for Other Services       Precautions / Restrictions Precautions Precautions: Fall Precaution Comments: several in the last 8 weeks, usually ends up sitting back Restrictions Weight Bearing Restrictions: No     Mobility  Bed  Mobility Overal bed mobility: Needs Assistance Bed Mobility: Supine to Sit, Sit to Supine     Supine to sit: Min assist, HOB elevated, Used rails Sit to supine: Min assist   General bed mobility comments: min A due to pt unable to hear commands, but pt elevating trunk via HHA once initiated    Transfers Overall transfer level: Needs assistance Equipment used: Rolling walker (2 wheels) Transfers: Sit to/from Stand Sit to Stand: Min assist           General transfer comment: STS from EOB and recliner with HHA for initiation and min A for power up    Ambulation/Gait Ambulation/Gait assistance: Min assist, Contact guard assist Gait Distance (Feet): 20 Feet Assistive device: Rolling walker (2 wheels) Gait Pattern/deviations: Step-through pattern, Shuffle, Trunk flexed, Decreased stride length       General Gait Details: intermittent min A for RW management        Balance Overall balance assessment: Needs assistance Sitting-balance support: Feet unsupported, Bilateral upper extremity supported, No upper extremity supported Sitting balance-Leahy Scale: Good Sitting balance - Comments: sitting EOB   Standing balance support: Bilateral upper extremity supported, During functional activity, Reliant on assistive device for balance Standing balance-Leahy Scale: Fair Standing balance comment: with RW support                            Cognition Arousal: Alert Behavior During Therapy: Flat affect Overall Cognitive Status: Impaired/Different from baseline                                 General Comments: Pt very confused and unable  to hear commands. Pt able to follow tactile and visual cues        Exercises      General Comments        Pertinent Vitals/Pain Pain Assessment Pain Assessment: Faces Pain Score: 0-No pain     PT Goals (current goals can now be found in the care plan section) Acute Rehab PT Goals PT Goal Formulation: With  patient/family Time For Goal Achievement: 08/02/23 Progress towards PT goals: Progressing toward goals    Frequency    Min 1X/week       AM-PAC PT "6 Clicks" Mobility   Outcome Measure  Help needed turning from your back to your side while in a flat bed without using bedrails?: A Little Help needed moving from lying on your back to sitting on the side of a flat bed without using bedrails?: A Little Help needed moving to and from a bed to a chair (including a wheelchair)?: A Little Help needed standing up from a chair using your arms (e.g., wheelchair or bedside chair)?: A Little Help needed to walk in hospital room?: A Little Help needed climbing 3-5 steps with a railing? : A Lot 6 Click Score: 17    End of Session Equipment Utilized During Treatment: Gait belt Activity Tolerance: Patient tolerated treatment well Patient left: in bed;with nursing/sitter in room;with call bell/phone within reach;with bed alarm set Nurse Communication: Mobility status PT Visit Diagnosis: Muscle weakness (generalized) (M62.81);Other abnormalities of gait and mobility (R26.89);Repeated falls (R29.6);Difficulty in walking, not elsewhere classified (R26.2)     Time: 4696-2952 PT Time Calculation (min) (ACUTE ONLY): 19 min  Charges:    $Gait Training: 8-22 mins PT General Charges $$ ACUTE PT VISIT: 1 Visit                     Johny Shock, PTA Acute Rehabilitation Services Secure Chat Preferred  [phone]    Johny Shock 07/21/2023, 4:59 PM

## 2023-07-22 DIAGNOSIS — F0280 Dementia in other diseases classified elsewhere without behavioral disturbance: Secondary | ICD-10-CM | POA: Diagnosis not present

## 2023-07-22 DIAGNOSIS — U071 COVID-19: Secondary | ICD-10-CM | POA: Diagnosis not present

## 2023-07-22 DIAGNOSIS — F02B18 Dementia in other diseases classified elsewhere, moderate, with other behavioral disturbance: Secondary | ICD-10-CM | POA: Diagnosis not present

## 2023-07-22 DIAGNOSIS — F0390 Unspecified dementia without behavioral disturbance: Secondary | ICD-10-CM | POA: Diagnosis not present

## 2023-07-22 DIAGNOSIS — M6281 Muscle weakness (generalized): Secondary | ICD-10-CM | POA: Diagnosis not present

## 2023-07-22 DIAGNOSIS — N401 Enlarged prostate with lower urinary tract symptoms: Secondary | ICD-10-CM | POA: Diagnosis not present

## 2023-07-22 DIAGNOSIS — E119 Type 2 diabetes mellitus without complications: Secondary | ICD-10-CM | POA: Diagnosis not present

## 2023-07-22 DIAGNOSIS — R0989 Other specified symptoms and signs involving the circulatory and respiratory systems: Secondary | ICD-10-CM | POA: Diagnosis not present

## 2023-07-22 DIAGNOSIS — Z741 Need for assistance with personal care: Secondary | ICD-10-CM | POA: Diagnosis not present

## 2023-07-22 DIAGNOSIS — G301 Alzheimer's disease with late onset: Secondary | ICD-10-CM | POA: Diagnosis not present

## 2023-07-22 DIAGNOSIS — R059 Cough, unspecified: Secondary | ICD-10-CM | POA: Diagnosis not present

## 2023-07-22 DIAGNOSIS — M6259 Muscle wasting and atrophy, not elsewhere classified, multiple sites: Secondary | ICD-10-CM | POA: Diagnosis not present

## 2023-07-22 DIAGNOSIS — Z7401 Bed confinement status: Secondary | ICD-10-CM | POA: Diagnosis not present

## 2023-07-22 DIAGNOSIS — R4182 Altered mental status, unspecified: Secondary | ICD-10-CM | POA: Diagnosis not present

## 2023-07-22 DIAGNOSIS — Z794 Long term (current) use of insulin: Secondary | ICD-10-CM | POA: Diagnosis not present

## 2023-07-22 DIAGNOSIS — I5042 Chronic combined systolic (congestive) and diastolic (congestive) heart failure: Secondary | ICD-10-CM | POA: Diagnosis not present

## 2023-07-22 LAB — GLUCOSE, CAPILLARY: Glucose-Capillary: 132 mg/dL — ABNORMAL HIGH (ref 70–99)

## 2023-07-22 MED ORDER — BENZONATATE 200 MG PO CAPS: 200.0000 mg | ORAL_CAPSULE | Freq: Three times a day (TID) | ORAL | Status: AC

## 2023-07-22 MED ORDER — GUAIFENESIN-DM 100-10 MG/5ML PO SYRP: 5.0000 mL | ORAL_SOLUTION | ORAL | Status: DC | PRN

## 2023-07-22 MED ORDER — LANTUS SOLOSTAR 100 UNIT/ML ~~LOC~~ SOPN: 23.0000 [IU] | PEN_INJECTOR | Freq: Every day | SUBCUTANEOUS | Status: DC

## 2023-07-22 MED ORDER — INSULIN ASPART 100 UNIT/ML IJ SOLN: 0.0000 [IU] | Freq: Three times a day (TID) | INTRAMUSCULAR | Status: DC

## 2023-07-22 NOTE — Progress Notes (Signed)
Mobility Specialist Progress Note    07/22/23 1003  Mobility  Activity Ambulated with assistance in room  Level of Assistance Minimal assist, patient does 75% or more  Assistive Device Front wheel walker  Distance Ambulated (ft) 40 ft (20+20)  Activity Response Tolerated well  Mobility Referral Yes  $Mobility charge 1 Mobility  Mobility Specialist Start Time (ACUTE ONLY) 0940  Mobility Specialist Stop Time (ACUTE ONLY) 1002  Mobility Specialist Time Calculation (min) (ACUTE ONLY) 22 min   Pt received in bed and agreeable. No complaints. Took x1 seated rest break between gait trials. Once in chair, completed x2 STS practice with cueing for safe hand placement. Left with call bell in reach and chair alarm on. RN notified.   [employer] Mobility Specialist  Please Neurosurgeon or Rehab Office at [phone]

## 2023-07-22 NOTE — Discharge Summary (Signed)
Physician Discharge Summary   Patient: Nicholas Burton MRN: [id number] DOB: April 15, 1928  Admit date:     07/18/2023  Discharge date: 07/22/23  Discharge Physician: Thad Ranger, MD    PCP: Etta Grandchild, MD   Recommendations at discharge:   Patient has chronic indwelling Foley catheter Has completed IV remdesivir course for COVID Follow CBC, bmet.  Diet: Dysphagia 3 mechanical soft diet with thin liquids  Discharge Diagnoses:    Sepsis (HCC)  COVID-19 virus infection   AKI (acute kidney injury) (HCC)  Acute hypoxic respiratory failure (HCC)   Acute on chronic urinary retention   HLD (hyperlipidemia)   Essential hypertension   Late onset Alzheimer's dementia without behavioral disturbance (HCC)   Prostate cancer metastatic to bone (HCC)   Insulin dependent type 2 diabetes mellitus [facility])    Hospital Course:  Patient is a 89 year old male with dementia, prostate CA with metastasis to bones, combined systolic and diastolic CHF, COPD, asthma, IDDM, hyperlipidemia, chronic urinary retention with indwelling catheter presented with confusion and disorientation.  Patient also has significant hearing deficit.  Patient lives at home with his wife and reportedly fell at home at 2 AM the night before the admission and was found on the floor at the end of the bed.  On the morning of admission, patient was putting toothpaste and hand soap on his hair which is atypical.  CBG was 481 for EMS.  Patient also had " cough and cold" since the weekend.  Per son, he had more labored respiration and cough. In ED, afebrile, tachycardic HR 111, BP 136/52, O2 sats 94% on 4 L, RR 36 CBC showed leukocytosis 16.7, CMP showed creatinine 1.54, hyperglycemia with glucose 384.    CT head showed no acute intracranial process.  Chest x-ray negative.   COVID-19 positive   Assessment and Plan:    Sepsis (HCC), POA likely due to COVID-19, UTI -Patient met sepsis criteria on admission with tachypnea, tachycardia,  hypoxia, leukocytosis -COVID-19 positive.  Patient was placed on airborne precautions -No hypoxia, O2 sat is 99% on room air -Completed IV remdesivir on 07/21/2023 -Continue Tessalon Perles, Robitussin as needed   Active problems Acute respiratory failure with hypoxia secondary to COVID-19 -Hypoxia improving, was placed on 4 L O2 via [place] in ED -Completed IV remdesivir on 07/21/2023 -On room air, O2 sats 99%   Acute on chronic urinary retention -with hx of BPH -Foley catheter exchanged in ED.  [facility] -Continue tamsulosin     AKI (acute kidney injury) (HCC) - Likely secondary to dehydration and sepsis, COVID-19.  Has chronic indwelling Foley catheter - Creatinine elevated 1.54 on admission - Was placed on fluid hydration, torsemide was held -Creatinine has improved to 1.2 on discharge. -He may resume torsemide every other day at discharge   Acute cystitis -UA showed 5 ketones, large leukocytes, WBCs more than 50, no bacteria.  Prior history of Pseudomonas UTI on 01/26/2023 -Urine culture negative, he will was placed on IV cefepime on admission, which has been discontinued   Insulin dependent type 2 diabetes mellitus (HCC), uncontrolled with hyperglycemia - Hemoglobin A1c 7.9 on 06/22/2023  -Continue Semglee 23 units daily at bedtime, sensitive sliding scale insulin      Prostate cancer metastatic to bone [facility]) -follows with Dr. Candise Che -on daily Xtandi and q3 month Leuprolid injections   Late onset Alzheimer's dementia without behavioral disturbance (HCC) - At baseline patient is alert and oriented to self, family members and place.  Unable  to assess orientation as patient is very hard of hearing although he appears disoriented.   Essential hypertension - Controlled, not on antihypertensives   HLD (hyperlipidemia) - Recently discontinued off statin due to potential interaction with Xtandi -continue colesevelam     Fall in the  hospital -Patient had a mechanical fall on 11/20 while trying to ambulate with a walker to use the restroom -Per RN, no loss of consciousness or hit his head, not in any pain   Generalized debility -Discussed in detail with the patient's son at the bedside, patient has been having recurrent falls at home, lives with his wife who is also elderly and unable to assist -PT evaluation recommended SNF   Estimated body mass index is 25.99 kg/m as calculated from the following:   Height as of this encounter: 5\' 9"  (1.753 m).   Weight as of this encounter: 79.8 kg.      Pain control - [employer] Controlled Substance Reporting System database was reviewed. and patient was instructed, not to drive, operate heavy machinery, perform activities at heights, swimming or participation in water activities or provide baby-sitting services while on Pain, Sleep and Anxiety Medications; until their outpatient Physician has advised to do so again. Also recommended to not to take more than prescribed Pain, Sleep and Anxiety Medications.  Consultants: None Procedures performed: None Disposition: Skilled nursing facility Diet recommendation:  Carb modified diet DISCHARGE MEDICATION: Allergies as of 07/22/2023       Reactions   Ace Inhibitors Cough   Oxycodone Itching, Rash   Penicillins Itching, Rash        Medication List     STOP taking these medications    dexamethasone 4 MG tablet Commonly known as: DECADRON   empagliflozin 10 MG Tabs tablet Commonly known as: Jardiance       TAKE these medications    acetaminophen 325 MG tablet Commonly known as: TYLENOL Take 1-2 tablets (325-650 mg total) by mouth every 6 (six) hours as needed for mild pain.   benzonatate 200 MG capsule Commonly known as: TESSALON Take 1 capsule (200 mg total) by mouth 3 (three) times daily for 7 days. For cough   cholecalciferol 1000 units tablet Commonly known as: VITAMIN D Take 1 tablet (1,000 Units  total) by mouth daily. What changed: when to take this   Colesevelam HCl 3.75 g Pack Take 1 packet by mouth daily.   cyanocobalamin 500 MCG tablet Commonly known as: VITAMIN B12 Take 1 tablet (500 mcg total) by mouth daily.   Gemtesa 75 MG Tabs Generic drug: Vibegron TAKE 1 TABLET DAILY   guaiFENesin-dextromethorphan 100-10 MG/5ML syrup Commonly known as: ROBITUSSIN DM Take 5 mLs by mouth every 4 (four) hours as needed for cough.   insulin aspart 100 UNIT/ML injection Commonly known as: novoLOG Inject 0-9 Units into the skin 3 (three) times daily with meals. Sliding scale CBG 70 - 120: 0 units CBG 121 - 150: 1 unit,  CBG 151 - 200: 2 units,  CBG 201 - 250: 3 units,  CBG 251 - 300: 5 units,  CBG 301 - 350: 7 units,  CBG 351 - 400: 9 units   CBG > 400: 9 units and notify your MD   Insulin Pen Needle 30G X 8 MM Misc Commonly known as: NOVOFINE Inject 10 each into the skin as needed.   Lantus SoloStar 100 UNIT/ML Solostar Pen Generic drug: insulin glargine Inject 23 Units into the skin at bedtime. What changed:  how much to  take when to take this   magnesium gluconate 500 MG tablet Commonly known as: MAGONATE Take 1 tablet (500 mg total) by mouth at bedtime.   melatonin 3 MG Tabs tablet Take 1 tablet (3 mg total) by mouth at bedtime as needed.   metoprolol tartrate 25 MG tablet Commonly known as: LOPRESSOR TAKE ONE-HALF (1/2) TABLET TWICE A DAY   PreserVision/Lutein Caps Take 1 capsule by mouth 2 (two) times daily.   tamsulosin 0.4 MG Caps capsule Commonly known as: FLOMAX Take 1 capsule (0.4 mg total) by mouth daily.   torsemide 10 MG tablet Commonly known as: DEMADEX Take 1 tablet (10 mg total) by mouth every other day.   Xtandi 80 MG tablet Generic drug: enzalutamide TAKE 1 TABLET DAILY        Discharge Exam: Filed Weights   07/19/23 1152  Weight: 79.8 kg   S: Fairly alert and oriented today, appears close to his baseline, son at the bedside, no  acute issues.  Hearing deficit otherwise no pain, acute shortness of breath, nausea vomiting or any pain.  BP (!) 144/84 (BP Location: Left Arm)   Pulse 91   Temp (!) 97.4 F (36.3 C) (Oral)   Resp 17   Ht 5\' 9"  (1.753 m)   Wt 79.8 kg   SpO2 99%   BMI 25.99 kg/m   Physical Exam General: Alert and awake, NAD, hearing deficit Cardiovascular: S1 S2 clear, RRR.  Respiratory: CTAB Gastrointestinal: Soft, nontender, nondistended, NBS Ext: no pedal edema bilaterally Neuro: no new deficits Psych: Normal affect, has hearing deficit and dementia   Condition at discharge: fair  The results of significant diagnostics from this hospitalization (including imaging, microbiology, ancillary and laboratory) are listed below for reference.   Imaging Studies: DG Swallowing Func-Speech Pathology  Result Date: 07/20/2023 Table formatting from the original result was not included. Modified Barium Swallow Study Patient Details Name: ONEL KONG MRN: [id number] Date of Birth: 1928/07/13 Today's Date: 07/20/2023 HPI/PMH: HPI: 89 y.o. male presents to ED 11/19 with altered mental status. Admitted for treatment of sepsis secondary to COVID and possible UTI. CXR No active disease. Slightly limited evaluation due to patient  positioning. PMH: dementia, prostate cancer with metastasis to bone, combined systolic and diastolic heart failure, COPD, asthma, insulin-dependent type 2 diabetes, hyperlipidemia, chronic urinary retention with indwelling catheter Clinical Impression: Clinical Impression: Pt presents with a functional oropharyngeal swallow marked by only occasional transient penetration of thin liquids (Penetration/aspiration scale score of 2, considered WFL). There was no aspiration over multiple swallows of various consistencies. There was adequate pharyngeal squeeze and base of tongue retraction, clearing the barium and leaving only trace residue.  Notable was esophageal retention with retrograde flow below  pharyngoesophageal segment (PES). Recommend regular solids; thin liquids; give meds whole in puree.  Sit upright after meals for 30-45 minutes. No SLP f/u is needed. Factors that may increase risk of adverse event in presence of aspiration Rubye Oaks [employer] Clearance Coots 2021): No data recorded Recommendations/Plan: Swallowing Evaluation Recommendations Swallowing Evaluation Recommendations Recommendations: PO diet PO Diet Recommendation: Thin liquids (Level 0); Regular Liquid Administration via: Cup; Straw Medication Administration: Whole meds with puree Supervision: Patient able to self-feed Oral care recommendations: Oral care BID (2x/day) Treatment Plan Treatment Plan Treatment recommendations: No treatment recommended at this time Follow-up recommendations: No SLP follow up Recommendations Recommendations for follow up therapy are one component of a multi-disciplinary discharge planning process, led by the attending physician.  Recommendations may be updated based on patient status, additional  functional criteria and insurance authorization. Assessment: Orofacial Exam: Orofacial Exam Oral Cavity: Oral Hygiene: WFL Oral Cavity - Dentition: Adequate natural dentition Orofacial Anatomy: WFL Oral Motor/Sensory Function: WFL Anatomy: Anatomy: Prominent cricopharyngeus; Suspected cervical osteophytes Boluses Administered: Boluses Administered Boluses Administered: Thin liquids (Level 0); Moderately thick liquids (Level 3, honey thick); Mildly thick liquids (Level 2, nectar thick); Puree; Solid  Oral Impairment Domain: Oral Impairment Domain Lip Closure: No labial escape Tongue control during bolus hold: Not tested Bolus preparation/mastication: Timely and efficient chewing and mashing Bolus transport/lingual motion: Brisk tongue motion Oral residue: Trace residue lining oral structures Location of oral residue : Tongue Initiation of pharyngeal swallow : Posterior laryngeal surface of the epiglottis  Pharyngeal Impairment Domain:  Pharyngeal Impairment Domain Soft palate elevation: No bolus between soft palate (SP)/pharyngeal wall (PW) Laryngeal elevation: Complete superior movement of thyroid cartilage with complete approximation of arytenoids to epiglottic petiole Anterior hyoid excursion: Complete anterior movement Epiglottic movement: Complete inversion Laryngeal vestibule closure: Incomplete, narrow column air/contrast in laryngeal vestibule Pharyngeal stripping wave : Present - complete Pharyngeal contraction (A/P view only): N/A Pharyngoesophageal segment opening: Partial distention/partial duration, partial obstruction of flow Tongue base retraction: Trace column of contrast or air between tongue base and PPW Pharyngeal residue: Trace residue within or on pharyngeal structures Location of pharyngeal residue: Valleculae  Esophageal Impairment Domain: Esophageal Impairment Domain Esophageal clearance upright position: Esophageal retention with retrograde flow below pharyngoesophageal segment (PES) Pill: Pill Consistency administered: -- (NT) Penetration/Aspiration Scale Score: Penetration/Aspiration Scale Score 1.  Material does not enter airway: Mildly thick liquids (Level 2, nectar thick); Moderately thick liquids (Level 3, honey thick); Puree; Solid 2.  Material enters airway, remains ABOVE vocal cords then ejected out: Mildly thick liquids (Level 2, nectar thick) Compensatory Strategies: Compensatory Strategies Compensatory strategies: Yes Straw: Effective   General Information: Caregiver present: No  Diet Prior to this Study: Dysphagia 3 (mechanical soft); Thin liquids (Level 0)   Temperature : Normal   Respiratory Status: WFL   Supplemental O2: None (Room air)   History of Recent Intubation: No  Behavior/Cognition: Alert; Cooperative No data recorded Baseline vocal quality/speech: Normal Volitional Cough: -- (unable to understand instructions with) Volitional Swallow: Able to elicit Exam Limitations: No limitations Goal Planning:  Prognosis for improved oropharyngeal function: Good No data recorded No data recorded No data recorded No data recorded Pain: Pain Assessment Pain Assessment: Faces Pain Score: 0 Faces Pain Scale: 0 End of Session: Start Time:SLP Start Time (ACUTE ONLY): 1405 Stop Time: SLP Stop Time (ACUTE ONLY): 1425 Time Calculation:SLP Time Calculation (min) (ACUTE ONLY): 20 min Charges: SLP Evaluations $ SLP Speech Visit: 1 Visit SLP Evaluations $BSS Swallow: 1 Procedure $MBS Swallow: 1 Procedure SLP visit diagnosis: SLP Visit Diagnosis: Dysphagia, unspecified (R13.10) Past Medical History: Past Medical History: Diagnosis Date  Allergy   ANEMIA ASSOCIATED W/OTHER [facility] NUTRITIONAL DEFIC 08/11/2010  Qualifier: Diagnosis of  By: Yetta Barre MD, Bernadene Bell.   Cancer [facility])   skin  Cataract   Chronic combined systolic and diastolic congestive heart failure (HCC) 10/30/2018  COPD with asthma (HCC) 03/02/2017  CRI (chronic renal insufficiency), stage 3 (moderate) (HCC) 10/25/2018  Deficiency anemia 10/25/2018  Diabetes mellitus   type 2  DOE (dyspnea on exertion) 09/03/2018  Edema 06/11/2009  Qualifier: Diagnosis of  By: Yetta Barre MD, Bernadene Bell.   History of skin cancer   Hyperlipidemia with target LDL less than 100 11/10/2008      Hypertension   Memory loss 05/14/2009  Obesity (BMI 30.0-34.9) 04/01/2013  Osteoarthritis  PSA elevation 10/25/2018  SKIN CANCER, HX OF 11/10/2008  Qualifier: Diagnosis of  By: Yetta Barre MD, Bernadene Bell.   Snoring 04/03/2014  Type II diabetes mellitus with manifestations (HCC) 11/10/2008  Estimated Creatinine Clearance: 38 mL/min (A) (by C-G formula based on SCr of 1.52 mg/dL (H)).   Venous stasis dermatitis of both lower extremities 01/29/2018  Vitamin D deficiency 11/10/2008 Past Surgical History: Past Surgical History: Procedure Laterality Date  CHOLECYSTECTOMY    INGUINAL HERNIA REPAIR    x 3  IR FLUORO GUIDED NEEDLE PLC ASPIRATION/INJECTION LOC  10/07/2022  JOINT REPLACEMENT    KNEE SURGERY    x 2  MELANOMA EXCISION    x 3 -- Left arm  TOTAL  KNEE ARTHROPLASTY    x 3 Blenda Mounts Laurice 07/20/2023, 2:51 PM  CT Head Wo Contrast  Result Date: 07/18/2023 CLINICAL DATA:  Altered mental status EXAM: CT HEAD WITHOUT CONTRAST CT CERVICAL SPINE WITHOUT CONTRAST TECHNIQUE: Multidetector CT imaging of the head and cervical spine was performed following the standard protocol without intravenous contrast. Multiplanar CT image reconstructions of the cervical spine were also generated. RADIATION DOSE REDUCTION: This exam was performed according to the departmental dose-optimization program which includes automated exposure control, adjustment of the mA and/or kV according to patient size and/or use of iterative reconstruction technique. COMPARISON:  10/05/2022 CT head, no prior CT cervical spine, correlation is made with 04/10/2012 cervical spine radiographs FINDINGS: Evaluation of the head and cervical spine is limited by motion. CT HEAD FINDINGS Brain: No evidence of acute infarct, hemorrhage, mass, mass effect, or midline shift. No hydrocephalus or extra-axial fluid collection. Age related cerebral atrophy. Vascular: No hyperdense vessel. Atherosclerotic calcifications in the intracranial carotid and vertebral arteries. Skull: Negative for fracture or focal lesion. Sinuses/Orbits: Complete opacification of the right maxillary sinus. Mucosal thickening in the left maxillary sinus and bilateral ethmoid air cells. Status post bilateral lens replacements. Other: The mastoid air cells are well aerated. CT CERVICAL SPINE FINDINGS Alignment: No traumatic listhesis. Mild anterolisthesis of C2 on C3, C3 on C4, and C7 on T1 Skull base and vertebrae: No acute fracture or suspicious osseous lesion. Soft tissues and spinal canal: No prevertebral fluid or swelling. No visible canal hematoma. Disc levels: Degenerative changes in the cervical spine.Limited evaluation given the degree of motion. Upper chest: No focal pulmonary opacity or pleural effusion. IMPRESSION:  Evaluation of the head and cervical spine is limited by motion. Within this limitation, no acute intracranial process. No acute fracture or traumatic listhesis in the cervical spine. Electronically Signed   By: Wiliam Ke M.D.   On: 07/18/2023 22:28   CT CERVICAL SPINE WO CONTRAST  Result Date: 07/18/2023 CLINICAL DATA:  Altered mental status EXAM: CT HEAD WITHOUT CONTRAST CT CERVICAL SPINE WITHOUT CONTRAST TECHNIQUE: Multidetector CT imaging of the head and cervical spine was performed following the standard protocol without intravenous contrast. Multiplanar CT image reconstructions of the cervical spine were also generated. RADIATION DOSE REDUCTION: This exam was performed according to the departmental dose-optimization program which includes automated exposure control, adjustment of the mA and/or kV according to patient size and/or use of iterative reconstruction technique. COMPARISON:  10/05/2022 CT head, no prior CT cervical spine, correlation is made with 04/10/2012 cervical spine radiographs FINDINGS: Evaluation of the head and cervical spine is limited by motion. CT HEAD FINDINGS Brain: No evidence of acute infarct, hemorrhage, mass, mass effect, or midline shift. No hydrocephalus or extra-axial fluid collection. Age related cerebral atrophy. Vascular: No hyperdense vessel. Atherosclerotic calcifications  in the intracranial carotid and vertebral arteries. Skull: Negative for fracture or focal lesion. Sinuses/Orbits: Complete opacification of the right maxillary sinus. Mucosal thickening in the left maxillary sinus and bilateral ethmoid air cells. Status post bilateral lens replacements. Other: The mastoid air cells are well aerated. CT CERVICAL SPINE FINDINGS Alignment: No traumatic listhesis. Mild anterolisthesis of C2 on C3, C3 on C4, and C7 on T1 Skull base and vertebrae: No acute fracture or suspicious osseous lesion. Soft tissues and spinal canal: No prevertebral fluid or swelling. No visible  canal hematoma. Disc levels: Degenerative changes in the cervical spine.Limited evaluation given the degree of motion. Upper chest: No focal pulmonary opacity or pleural effusion. IMPRESSION: Evaluation of the head and cervical spine is limited by motion. Within this limitation, no acute intracranial process. No acute fracture or traumatic listhesis in the cervical spine. Electronically Signed   By: Wiliam Ke M.D.   On: 07/18/2023 22:28   DG Chest Portable 1 View  Result Date: 07/18/2023 CLINICAL DATA:  AMS Reason for exam: ams, declining mental status; best attainable images due to pt condition and inability to following directions/hold still EXAM: PORTABLE CHEST 1 VIEW COMPARISON:  Chest x-ray 06/02/2021, CT chest 08/11/2023 FINDINGS: Slightly limited evaluation due to patient positioning. The heart and mediastinal contours are grossly unchanged. Atherosclerotic plaque No focal consolidation. No pulmonary edema. Blunting of the left costophrenic angle likely due to positioning. No pleural effusion. No pneumothorax. No acute osseous abnormality. IMPRESSION: No active disease. Slightly limited evaluation due to patient positioning. Electronically Signed   By: Tish Frederickson M.D.   On: 07/18/2023 22:23    Microbiology: Results for orders placed or performed during the hospital encounter of 07/18/23  Blood Culture (routine x 2)     Status: None (Preliminary result)   Collection Time: 07/18/23  7:35 PM   Specimen: BLOOD  Result Value Ref Range Status   Specimen Description BLOOD RIGHT ANTECUBITAL  Final   Special Requests   Final    BOTTLES DRAWN AEROBIC AND ANAEROBIC Blood Culture adequate volume   Culture   Final    NO GROWTH 4 DAYS Performed at [facility], [address], [place], [place] [address]    Report Status PENDING  Incomplete  Blood Culture (routine x 2)     Status: None (Preliminary result)   Collection Time: 07/18/23  8:15 PM   Specimen: BLOOD LEFT HAND  Result Value  Ref Range Status   Specimen Description BLOOD LEFT HAND  Final   Special Requests   Final    BOTTLES DRAWN AEROBIC AND ANAEROBIC Blood Culture adequate volume   Culture   Final    NO GROWTH 4 DAYS Performed at [facility], [address] [place], [place] [address]    Report Status PENDING  Incomplete  Resp panel by RT-PCR (RSV, Flu A&B, Covid) Anterior Nasal Swab     Status: Abnormal   Collection Time: 07/18/23  9:16 PM   Specimen: Anterior Nasal Swab  Result Value Ref Range Status   SARS Coronavirus 2 by RT PCR POSITIVE (A) NEGATIVE Final   Influenza A by PCR NEGATIVE NEGATIVE Final   Influenza B by PCR NEGATIVE NEGATIVE Final    Comment: (NOTE) The Xpert Xpress SARS-CoV-2/FLU/RSV plus assay is intended as an aid in the diagnosis of influenza from Nasopharyngeal swab specimens and should not be used as a sole basis for treatment. Nasal washings and aspirates are unacceptable for Xpert Xpress SARS-CoV-2/FLU/RSV testing.  Fact  Sheet for Patients: BloggerCourse.com  Fact Sheet for Healthcare Providers: SeriousBroker.it  This test is not yet approved or cleared by the [place] FDA and has been authorized for detection and/or diagnosis of SARS-CoV-2 by FDA under an Emergency Use Authorization (EUA). This EUA will remain in effect (meaning this test can be used) for the duration of the COVID-19 declaration under Section 564(b)(1) of the Act, 21 U.S.C. section 360bbb-3(b)(1), unless the authorization is terminated or revoked.     Resp Syncytial Virus by PCR NEGATIVE NEGATIVE Final    Comment: (NOTE) Fact Sheet for Patients: BloggerCourse.com  Fact Sheet for Healthcare Providers: SeriousBroker.it  This test is not yet approved or cleared by the [place] FDA and has been authorized for detection and/or diagnosis of SARS-CoV-2 by FDA under an Emergency Use  Authorization (EUA). This EUA will remain in effect (meaning this test can be used) for the duration of the COVID-19 declaration under Section 564(b)(1) of the Act, 21 U.S.C. section 360bbb-3(b)(1), unless the authorization is terminated or revoked.  Performed at [facility], [address], [place], [place] [address]   Urine Culture     Status: None   Collection Time: 07/18/23  9:37 PM   Specimen: Urine, Catheterized  Result Value Ref Range Status   Specimen Description URINE, CATHETERIZED  Final   Special Requests NONE Reflexed from [id number]  Final   Culture   Final    NO GROWTH Performed at [facility], [address], [place], [place] [address]    Report Status 07/20/2023 FINAL  Final    Labs: CBC: Recent Labs  Lab 07/18/23 1904 07/19/23 0633  WBC 16.7* 11.6*  NEUTROABS 14.0*  --   HGB 12.7* 12.3*  HCT 39.2 38.7*  MCV 89.9 91.3  PLT 206 190   Basic Metabolic Panel: Recent Labs  Lab 07/18/23 1904 07/19/23 0633  NA 137 140  K 4.2 3.4*  CL 101 103  CO2 25 27  GLUCOSE 384* 220*  BUN 20 13  CREATININE 1.54* 1.20  CALCIUM 8.6* 8.5*   Liver Function Tests: Recent Labs  Lab 07/18/23 1904  AST 23  ALT 12  ALKPHOS 125  BILITOT 1.0  PROT 5.9*  ALBUMIN 2.8*   CBG: Recent Labs  Lab 07/21/23 0735 07/21/23 1132 07/21/23 1741 07/21/23 2127 07/22/23 0751  GLUCAP 155* 187* 194* 66* 132*    Discharge time spent: greater than 30 minutes.  Signed: Thad Ranger, MD [facility] 07/22/2023

## 2023-07-22 NOTE — Plan of Care (Signed)
  Problem: Nutrition: Goal: Adequate nutrition will be maintained Outcome: Progressing   Problem: Coping: Goal: Level of anxiety will decrease Outcome: Progressing   

## 2023-07-22 NOTE — TOC Transition Note (Signed)
Transition of Care [facility]) - CM/SW Discharge Note   Patient Details  Name: Nicholas Burton MRN: [id number] Date of Birth: 24-Jun-1928  [facility] of [facility]) CM/SW Contact:  Deatra Robinson, [place] Phone Number: 07/22/2023, 11:27 AM   Clinical Narrative: pt for dc to [facility] today. Spoke to [facility] in admissions who confirmed they are prepared to admit pt to room 703. Pt's family at bedside and agreeable to dc. RN provided with number for report and PTAR arranged for transport. SW signing off at dc.   Dellie Burns, MSW, LCSW [phone] (coverage)      Final next level of care: Skilled Nursing Facility Barriers to Discharge: Barriers Resolved   Patient Goals and CMS Choice   Choice offered to / list presented to : Adult Children  Discharge Placement                Patient chooses bed at: [facility] Patient to be transferred to facility by: PTAR Name of family member notified: son/Curtis Patient and family notified of of transfer: 07/22/23  Discharge Plan and Services Additional resources added to the After Visit Summary for                                       Social Determinants of Health (SDOH) Interventions SDOH Screenings   Food Insecurity: No Food Insecurity (07/19/2023)  Housing: Low Risk  (07/19/2023)  Transportation Needs: No Transportation Needs (07/19/2023)  Utilities: Not At Risk (07/19/2023)  Alcohol Screen: Low Risk  (07/13/2023)  Depression (PHQ2-9): Low Risk  (10/27/2022)  Financial Resource Strain: Low Risk  (07/13/2023)  Physical Activity: Sufficiently Active (07/13/2023)  Social Connections: Moderately Isolated (07/13/2023)  Stress: Patient Unable To Answer (07/13/2023)  Tobacco Use: Low Risk  (07/18/2023)  Health Literacy: Inadequate Health Literacy (07/13/2023)     Readmission Risk Interventions     No data to display

## 2023-07-23 LAB — CULTURE, BLOOD (ROUTINE X 2)
Culture: NO GROWTH
Culture: NO GROWTH
Special Requests: ADEQUATE
Special Requests: ADEQUATE

## 2023-07-24 NOTE — Consult Note (Signed)
[facility] Liaison Consult Note    07/24/2023  Nicholas Burton [birth year] [id number]  [facility] Consult SNF  Primary Care Provider:  Etta Grandchild, MD, [place] at [facility] is listed to provide the transition of care [TOC] follow up.  Insurance:  Medicare ACO Reach  Patient was reviewed for post hospital barriers to care  Patient was screened for hospitalization and on behalf of [facility]  Care Coordination to assess for post hospital community care needs.  Patient is being considered for a skilled nursing facility level of care for post hospital transition.  Patient transitioned to [facility] which is an affiliated facility.  Noted that patient can be followed by [facility] RN with traditional Medicare and approved Medicare Advantage plans.    Plan: This HL will notify the Community [facility] RN who can follow for any known or needs for transitional care needs for returning to post facility care coordination needs to return to community.  For questions or referrals, please contact:  Charlesetta Shanks, RN, BSN, CCM [place]  [facility], [facility] Liaison Direct Dial: [phone] or secure chat Email: [email or web address]

## 2023-07-25 ENCOUNTER — Other Ambulatory Visit: Payer: Self-pay | Admitting: *Deleted

## 2023-07-25 DIAGNOSIS — F02B18 Dementia in other diseases classified elsewhere, moderate, with other behavioral disturbance: Secondary | ICD-10-CM | POA: Diagnosis not present

## 2023-07-25 DIAGNOSIS — M6281 Muscle weakness (generalized): Secondary | ICD-10-CM | POA: Diagnosis not present

## 2023-07-25 DIAGNOSIS — G301 Alzheimer's disease with late onset: Secondary | ICD-10-CM | POA: Diagnosis not present

## 2023-07-25 DIAGNOSIS — Z741 Need for assistance with personal care: Secondary | ICD-10-CM | POA: Diagnosis not present

## 2023-07-25 DIAGNOSIS — U071 COVID-19: Secondary | ICD-10-CM | POA: Diagnosis not present

## 2023-07-25 DIAGNOSIS — I5042 Chronic combined systolic (congestive) and diastolic (congestive) heart failure: Secondary | ICD-10-CM | POA: Diagnosis not present

## 2023-07-25 NOTE — Patient Outreach (Signed)
Late entry for 07/24/23  Mr. Neukam resides in [facility].  Screening for potential chronic care management services as a benefit of health plan and primary care provider.  Spoke with Alanda Amass social worker to make aware writer is following for transition plans and potential complex care care management needs.   Will continue to follow.   Raiford Noble, MSN, RN, BSN [place]  [facility], [facility] RN Post- Acute Care Manager Direct Dial: [phone]

## 2023-07-26 ENCOUNTER — Ambulatory Visit: Payer: Medicare Other | Admitting: Internal Medicine

## 2023-07-30 DIAGNOSIS — G301 Alzheimer's disease with late onset: Secondary | ICD-10-CM | POA: Diagnosis not present

## 2023-07-30 DIAGNOSIS — F0280 Dementia in other diseases classified elsewhere without behavioral disturbance: Secondary | ICD-10-CM | POA: Diagnosis not present

## 2023-08-01 DIAGNOSIS — I5042 Chronic combined systolic (congestive) and diastolic (congestive) heart failure: Secondary | ICD-10-CM | POA: Diagnosis not present

## 2023-08-01 DIAGNOSIS — G301 Alzheimer's disease with late onset: Secondary | ICD-10-CM | POA: Diagnosis not present

## 2023-08-01 DIAGNOSIS — Z741 Need for assistance with personal care: Secondary | ICD-10-CM | POA: Diagnosis not present

## 2023-08-01 DIAGNOSIS — F02B18 Dementia in other diseases classified elsewhere, moderate, with other behavioral disturbance: Secondary | ICD-10-CM | POA: Diagnosis not present

## 2023-08-01 DIAGNOSIS — U071 COVID-19: Secondary | ICD-10-CM | POA: Diagnosis not present

## 2023-08-01 DIAGNOSIS — M6281 Muscle weakness (generalized): Secondary | ICD-10-CM | POA: Diagnosis not present

## 2023-08-04 DIAGNOSIS — I5042 Chronic combined systolic (congestive) and diastolic (congestive) heart failure: Secondary | ICD-10-CM | POA: Diagnosis not present

## 2023-08-04 DIAGNOSIS — F02B18 Dementia in other diseases classified elsewhere, moderate, with other behavioral disturbance: Secondary | ICD-10-CM | POA: Diagnosis not present

## 2023-08-04 DIAGNOSIS — G301 Alzheimer's disease with late onset: Secondary | ICD-10-CM | POA: Diagnosis not present

## 2023-08-04 DIAGNOSIS — Z741 Need for assistance with personal care: Secondary | ICD-10-CM | POA: Diagnosis not present

## 2023-08-04 DIAGNOSIS — R0989 Other specified symptoms and signs involving the circulatory and respiratory systems: Secondary | ICD-10-CM | POA: Diagnosis not present

## 2023-08-04 DIAGNOSIS — U071 COVID-19: Secondary | ICD-10-CM | POA: Diagnosis not present

## 2023-08-04 DIAGNOSIS — R059 Cough, unspecified: Secondary | ICD-10-CM | POA: Diagnosis not present

## 2023-08-04 DIAGNOSIS — M6281 Muscle weakness (generalized): Secondary | ICD-10-CM | POA: Diagnosis not present

## 2023-08-08 ENCOUNTER — Other Ambulatory Visit: Payer: Self-pay | Admitting: Interventional Cardiology

## 2023-08-08 DIAGNOSIS — F02B18 Dementia in other diseases classified elsewhere, moderate, with other behavioral disturbance: Secondary | ICD-10-CM | POA: Diagnosis not present

## 2023-08-08 DIAGNOSIS — M6281 Muscle weakness (generalized): Secondary | ICD-10-CM | POA: Diagnosis not present

## 2023-08-08 DIAGNOSIS — Z741 Need for assistance with personal care: Secondary | ICD-10-CM | POA: Diagnosis not present

## 2023-08-08 DIAGNOSIS — I5042 Chronic combined systolic (congestive) and diastolic (congestive) heart failure: Secondary | ICD-10-CM | POA: Diagnosis not present

## 2023-08-08 DIAGNOSIS — U071 COVID-19: Secondary | ICD-10-CM | POA: Diagnosis not present

## 2023-08-08 DIAGNOSIS — G301 Alzheimer's disease with late onset: Secondary | ICD-10-CM | POA: Diagnosis not present

## 2023-08-11 DIAGNOSIS — M6281 Muscle weakness (generalized): Secondary | ICD-10-CM | POA: Diagnosis not present

## 2023-08-11 DIAGNOSIS — U071 COVID-19: Secondary | ICD-10-CM | POA: Diagnosis not present

## 2023-08-11 DIAGNOSIS — F02B18 Dementia in other diseases classified elsewhere, moderate, with other behavioral disturbance: Secondary | ICD-10-CM | POA: Diagnosis not present

## 2023-08-11 DIAGNOSIS — G301 Alzheimer's disease with late onset: Secondary | ICD-10-CM | POA: Diagnosis not present

## 2023-08-11 DIAGNOSIS — I5042 Chronic combined systolic (congestive) and diastolic (congestive) heart failure: Secondary | ICD-10-CM | POA: Diagnosis not present

## 2023-08-11 DIAGNOSIS — Z741 Need for assistance with personal care: Secondary | ICD-10-CM | POA: Diagnosis not present

## 2023-08-14 DIAGNOSIS — N179 Acute kidney failure, unspecified: Secondary | ICD-10-CM | POA: Diagnosis not present

## 2023-08-14 DIAGNOSIS — N390 Urinary tract infection, site not specified: Secondary | ICD-10-CM | POA: Diagnosis not present

## 2023-08-14 DIAGNOSIS — A419 Sepsis, unspecified organism: Secondary | ICD-10-CM | POA: Diagnosis not present

## 2023-08-14 DIAGNOSIS — I10 Essential (primary) hypertension: Secondary | ICD-10-CM | POA: Diagnosis not present

## 2023-08-14 DIAGNOSIS — J4489 Other specified chronic obstructive pulmonary disease: Secondary | ICD-10-CM | POA: Diagnosis not present

## 2023-08-14 DIAGNOSIS — J9601 Acute respiratory failure with hypoxia: Secondary | ICD-10-CM | POA: Diagnosis not present

## 2023-08-14 DIAGNOSIS — C61 Malignant neoplasm of prostate: Secondary | ICD-10-CM | POA: Diagnosis not present

## 2023-08-14 DIAGNOSIS — E119 Type 2 diabetes mellitus without complications: Secondary | ICD-10-CM | POA: Diagnosis not present

## 2023-08-14 DIAGNOSIS — U071 COVID-19: Secondary | ICD-10-CM | POA: Diagnosis not present

## 2023-08-14 DIAGNOSIS — I5042 Chronic combined systolic (congestive) and diastolic (congestive) heart failure: Secondary | ICD-10-CM | POA: Diagnosis not present

## 2023-08-14 DIAGNOSIS — C7951 Secondary malignant neoplasm of bone: Secondary | ICD-10-CM | POA: Diagnosis not present

## 2023-08-14 DIAGNOSIS — G301 Alzheimer's disease with late onset: Secondary | ICD-10-CM | POA: Diagnosis not present

## 2023-08-14 DIAGNOSIS — R339 Retention of urine, unspecified: Secondary | ICD-10-CM | POA: Diagnosis not present

## 2023-08-16 ENCOUNTER — Telehealth: Payer: Self-pay

## 2023-08-16 ENCOUNTER — Ambulatory Visit (INDEPENDENT_AMBULATORY_CARE_PROVIDER_SITE_OTHER): Payer: Medicare Other

## 2023-08-16 ENCOUNTER — Encounter: Payer: Self-pay | Admitting: Internal Medicine

## 2023-08-16 ENCOUNTER — Ambulatory Visit (INDEPENDENT_AMBULATORY_CARE_PROVIDER_SITE_OTHER): Payer: Medicare Other | Admitting: Internal Medicine

## 2023-08-16 VITALS — BP 110/70 | HR 80 | Temp 98.4°F | Ht 69.0 in | Wt 164.0 lb

## 2023-08-16 DIAGNOSIS — I5032 Chronic diastolic (congestive) heart failure: Secondary | ICD-10-CM

## 2023-08-16 DIAGNOSIS — R052 Subacute cough: Secondary | ICD-10-CM

## 2023-08-16 DIAGNOSIS — J90 Pleural effusion, not elsewhere classified: Secondary | ICD-10-CM | POA: Diagnosis not present

## 2023-08-16 DIAGNOSIS — I10 Essential (primary) hypertension: Secondary | ICD-10-CM

## 2023-08-16 NOTE — Telephone Encounter (Signed)
Copied from CRM [id number]. Topic: Clinical - Home Health Verbal Orders >> Aug 16, 2023  3:03 PM Elizebeth Brooking wrote: Caller/Agency: Madelon Lips  Callback Number: [id number] Service Requested: Home Health  Frequency: 2x a week for 3 weeks  1x a week for 6 weeks  with 2 prn wound care order Clean with normal saline, apply xerofrom gauze, cover with dry gauze and secure with tape  2x a week   Any new concerns about the patient? Yes, Need the correct dosage of insulin that the patient is suppose to take

## 2023-08-16 NOTE — Progress Notes (Signed)
Subjective:  Patient ID: Nicholas Burton, male    DOB: 22-Nov-1927  Age: 89 y.o. MRN: [id number]  CC: Cough   HPI Nicholas Burton presents for f/up ----  Discussed the use of AI scribe software for clinical note transcription with the patient, who gave verbal consent to proceed.  History of Present Illness   The patient, with a history of chronic cough and recent falls, was examined by a nurse who recommended further evaluation due to concerns of pneumonia. The patient has been experiencing a persistent cough, but has had difficulty expectorating mucus.   The patient's mobility has been significantly compromised, with frequent falls, particularly when attempting to use the bathroom. Despite these falls, the patient continues to use a walker and attempts to maintain some level of independence. The falls often occur early in the morning when the patient attempts to empty his catheter bag.  The patient has a history of hospitalization due to COVID-19. The patient's hearing has significantly deteriorated, and he has been experiencing balance issues. The patient has not yet been enrolled in hospice care, but there is a suggestion that this may be a necessary step in his care.       Outpatient Medications Prior to Visit  Medication Sig Dispense Refill   acetaminophen (TYLENOL) 325 MG tablet Take 1-2 tablets (325-650 mg total) by mouth every 6 (six) hours as needed for mild pain.     cholecalciferol (VITAMIN D) 1000 units tablet Take 1 tablet (1,000 Units total) by mouth daily. (Patient taking differently: Take 1,000 Units by mouth every evening.) 30 tablet 0   Colesevelam HCl 3.75 g PACK Take 1 packet by mouth daily. 60 packet 1   cyanocobalamin (VITAMIN B12) 500 MCG tablet Take 1 tablet (500 mcg total) by mouth daily. 30 tablet 0   guaiFENesin-dextromethorphan (ROBITUSSIN DM) 100-10 MG/5ML syrup Take 5 mLs by mouth every 4 (four) hours as needed for cough.     insulin aspart (NOVOLOG) 100 UNIT/ML  injection Inject 0-9 Units into the skin 3 (three) times daily with meals. Sliding scale CBG 70 - 120: 0 units CBG 121 - 150: 1 unit,  CBG 151 - 200: 2 units,  CBG 201 - 250: 3 units,  CBG 251 - 300: 5 units,  CBG 301 - 350: 7 units,  CBG 351 - 400: 9 units   CBG > 400: 9 units and notify your MD     Insulin Pen Needle (NOVOFINE) 30G X 8 MM MISC Inject 10 each into the skin as needed. 100 each 1   LANTUS SOLOSTAR 100 UNIT/ML Solostar Pen Inject 23 Units into the skin at bedtime.     magnesium gluconate (MAGONATE) 500 MG tablet Take 1 tablet (500 mg total) by mouth at bedtime. 30 tablet 0   melatonin 3 MG TABS tablet Take 1 tablet (3 mg total) by mouth at bedtime as needed. 30 tablet 0   metoprolol tartrate (LOPRESSOR) 25 MG tablet TAKE 1/2 TABLET BY MOUTH TWICE DAILY 90 tablet 1   Multiple Vitamins-Minerals (PRESERVISION/LUTEIN) CAPS Take 1 capsule by mouth 2 (two) times daily.     Potassium Chloride ER 20 MEQ TBCR Take 1 tablet by mouth 3 (three) times daily.     torsemide (DEMADEX) 10 MG tablet Take 1 tablet (10 mg total) by mouth every other day. 45 tablet 2   Vibegron (GEMTESA) 75 MG TABS TAKE 1 TABLET DAILY 90 tablet 0   XTANDI 80 MG tablet TAKE 1 TABLET DAILY 30  tablet 1   tamsulosin (FLOMAX) 0.4 MG CAPS capsule Take 1 capsule (0.4 mg total) by mouth daily. (Patient not taking: Reported on 08/16/2023) 90 capsule 1   No facility-administered medications prior to visit.    ROS Review of Systems  Constitutional:  Positive for appetite change, fatigue and unexpected weight change (wt loss). Negative for chills, diaphoresis and fever.  HENT:  Negative for trouble swallowing.   Respiratory:  Positive for cough and shortness of breath. Negative for chest tightness.   Cardiovascular:  Negative for chest pain, palpitations and leg swelling.  Gastrointestinal:  Negative for abdominal pain, constipation, diarrhea, nausea and vomiting.  Endocrine: Positive for polyuria.  Genitourinary:  Positive  for difficulty urinating and frequency. Negative for dysuria and hematuria.  Musculoskeletal:  Positive for gait problem.  Hematological:  Negative for adenopathy. Does not bruise/bleed easily.  Psychiatric/Behavioral:  Positive for confusion and decreased concentration.     Objective:  BP 110/70 (BP Location: Right Arm, Patient Position: Sitting, Cuff Size: Normal)   Pulse 80   Temp 98.4 F (36.9 C) (Oral)   Ht 5\' 9"  (1.753 m)   Wt 164 lb (74.4 kg)   SpO2 95%   BMI 24.22 kg/m   BP Readings from Last 3 Encounters:  08/16/23 110/70  07/22/23 (!) 144/84  06/22/23 114/66    Wt Readings from Last 3 Encounters:  08/16/23 164 lb (74.4 kg)  07/19/23 175 lb 15.9 oz (79.8 kg)  07/13/23 176 lb (79.8 kg)    Physical Exam Vitals reviewed.  Constitutional:      General: He is not in acute distress.    Appearance: He is ill-appearing. He is not toxic-appearing or diaphoretic.  Eyes:     General: No scleral icterus.    Conjunctiva/sclera: Conjunctivae normal.  Cardiovascular:     Rate and Rhythm: Normal rate and regular rhythm.     Heart sounds: No murmur heard. Pulmonary:     Effort: Pulmonary effort is normal. No tachypnea or accessory muscle usage.     Breath sounds: Examination of the left-upper field reveals decreased breath sounds. Examination of the right-middle field reveals decreased breath sounds. Examination of the left-middle field reveals decreased breath sounds. Examination of the right-lower field reveals decreased breath sounds. Examination of the left-lower field reveals decreased breath sounds. Decreased breath sounds present. No wheezing, rhonchi or rales.  Abdominal:     General: Abdomen is flat.     Palpations: There is no mass.     Tenderness: There is no abdominal tenderness. There is no guarding.     Hernia: No hernia is present.  Musculoskeletal:     Cervical back: Neck supple.     Right lower leg: No edema.     Left lower leg: No edema.  Skin:     General: Skin is warm and dry.     Findings: No rash.  Neurological:     Mental Status: He is alert. Mental status is at baseline.     Lab Results  Component Value Date   WBC 11.6 (H) 07/19/2023   HGB 12.3 (L) 07/19/2023   HCT 38.7 (L) 07/19/2023   PLT 190 07/19/2023   GLUCOSE 220 (H) 07/19/2023   CHOL 83 09/17/2019   TRIG 136.0 09/17/2019   HDL 36.40 (L) 09/17/2019   LDLCALC 19 09/17/2019   ALT 12 07/18/2023   AST 23 07/18/2023   NA 140 07/19/2023   K 3.4 (L) 07/19/2023   CL 103 07/19/2023   CREATININE 1.20 07/19/2023  BUN 13 07/19/2023   CO2 27 07/19/2023   TSH 3.821 10/06/2022   PSA 11.21 (H) 10/30/2017   INR 1.0 07/18/2023   HGBA1C 7.9 (H) 06/22/2023   MICROALBUR 0.7 09/17/2019    CT Head Wo Contrast Result Date: 07/18/2023 CLINICAL DATA:  Altered mental status EXAM: CT HEAD WITHOUT CONTRAST CT CERVICAL SPINE WITHOUT CONTRAST TECHNIQUE: Multidetector CT imaging of the head and cervical spine was performed following the standard protocol without intravenous contrast. Multiplanar CT image reconstructions of the cervical spine were also generated. RADIATION DOSE REDUCTION: This exam was performed according to the departmental dose-optimization program which includes automated exposure control, adjustment of the mA and/or kV according to patient size and/or use of iterative reconstruction technique. COMPARISON:  10/05/2022 CT head, no prior CT cervical spine, correlation is made with 04/10/2012 cervical spine radiographs FINDINGS: Evaluation of the head and cervical spine is limited by motion. CT HEAD FINDINGS Brain: No evidence of acute infarct, hemorrhage, mass, mass effect, or midline shift. No hydrocephalus or extra-axial fluid collection. Age related cerebral atrophy. Vascular: No hyperdense vessel. Atherosclerotic calcifications in the intracranial carotid and vertebral arteries. Skull: Negative for fracture or focal lesion. Sinuses/Orbits: Complete opacification of the  right maxillary sinus. Mucosal thickening in the left maxillary sinus and bilateral ethmoid air cells. Status post bilateral lens replacements. Other: The mastoid air cells are well aerated. CT CERVICAL SPINE FINDINGS Alignment: No traumatic listhesis. Mild anterolisthesis of C2 on C3, C3 on C4, and C7 on T1 Skull base and vertebrae: No acute fracture or suspicious osseous lesion. Soft tissues and spinal canal: No prevertebral fluid or swelling. No visible canal hematoma. Disc levels: Degenerative changes in the cervical spine.Limited evaluation given the degree of motion. Upper chest: No focal pulmonary opacity or pleural effusion. IMPRESSION: Evaluation of the head and cervical spine is limited by motion. Within this limitation, no acute intracranial process. No acute fracture or traumatic listhesis in the cervical spine. Electronically Signed   By: Wiliam Ke M.D.   On: 07/18/2023 22:28   CT CERVICAL SPINE WO CONTRAST Result Date: 07/18/2023 CLINICAL DATA:  Altered mental status EXAM: CT HEAD WITHOUT CONTRAST CT CERVICAL SPINE WITHOUT CONTRAST TECHNIQUE: Multidetector CT imaging of the head and cervical spine was performed following the standard protocol without intravenous contrast. Multiplanar CT image reconstructions of the cervical spine were also generated. RADIATION DOSE REDUCTION: This exam was performed according to the departmental dose-optimization program which includes automated exposure control, adjustment of the mA and/or kV according to patient size and/or use of iterative reconstruction technique. COMPARISON:  10/05/2022 CT head, no prior CT cervical spine, correlation is made with 04/10/2012 cervical spine radiographs FINDINGS: Evaluation of the head and cervical spine is limited by motion. CT HEAD FINDINGS Brain: No evidence of acute infarct, hemorrhage, mass, mass effect, or midline shift. No hydrocephalus or extra-axial fluid collection. Age related cerebral atrophy. Vascular: No  hyperdense vessel. Atherosclerotic calcifications in the intracranial carotid and vertebral arteries. Skull: Negative for fracture or focal lesion. Sinuses/Orbits: Complete opacification of the right maxillary sinus. Mucosal thickening in the left maxillary sinus and bilateral ethmoid air cells. Status post bilateral lens replacements. Other: The mastoid air cells are well aerated. CT CERVICAL SPINE FINDINGS Alignment: No traumatic listhesis. Mild anterolisthesis of C2 on C3, C3 on C4, and C7 on T1 Skull base and vertebrae: No acute fracture or suspicious osseous lesion. Soft tissues and spinal canal: No prevertebral fluid or swelling. No visible canal hematoma. Disc levels: Degenerative changes in the cervical  spine.Limited evaluation given the degree of motion. Upper chest: No focal pulmonary opacity or pleural effusion. IMPRESSION: Evaluation of the head and cervical spine is limited by motion. Within this limitation, no acute intracranial process. No acute fracture or traumatic listhesis in the cervical spine. Electronically Signed   By: Wiliam Ke M.D.   On: 07/18/2023 22:28   DG Chest Portable 1 View Result Date: 07/18/2023 CLINICAL DATA:  AMS Reason for exam: ams, declining mental status; best attainable images due to pt condition and inability to following directions/hold still EXAM: PORTABLE CHEST 1 VIEW COMPARISON:  Chest x-ray 06/02/2021, CT chest 08/11/2023 FINDINGS: Slightly limited evaluation due to patient positioning. The heart and mediastinal contours are grossly unchanged. Atherosclerotic plaque No focal consolidation. No pulmonary edema. Blunting of the left costophrenic angle likely due to positioning. No pleural effusion. No pneumothorax. No acute osseous abnormality. IMPRESSION: No active disease. Slightly limited evaluation due to patient positioning. Electronically Signed   By: Tish Frederickson M.D.   On: 07/18/2023 22:23   DG Chest 2 View Result Date: 08/16/2023 CLINICAL DATA:   Subacute cough. EXAM: CHEST - 2 VIEW COMPARISON:  07/18/2023 FINDINGS: Low volume film. Cardiopericardial silhouette is at upper limits of normal for size. Small left pleural effusion is similar to prior. Right lung clear. Patient's face obscures the lung apices. Bones are diffusely demineralized. IMPRESSION: Low volume film with small left pleural effusion. Electronically Signed   By: Kennith Center M.D.   On: 08/16/2023 16:45     Assessment & Plan:    Subacute cough- CXR is negative for mass/infiltrate -     DG Chest 2 View; Future  Essential hypertension- BP is soft.  Chronic diastolic CHF (congestive heart failure) (HCC) - He has a normal volume status.     Follow-up: No follow-ups on file.  Sanda Linger, MD

## 2023-08-17 ENCOUNTER — Telehealth: Payer: Self-pay | Admitting: Internal Medicine

## 2023-08-17 DIAGNOSIS — U071 COVID-19: Secondary | ICD-10-CM | POA: Diagnosis not present

## 2023-08-17 DIAGNOSIS — N390 Urinary tract infection, site not specified: Secondary | ICD-10-CM | POA: Diagnosis not present

## 2023-08-17 DIAGNOSIS — I10 Essential (primary) hypertension: Secondary | ICD-10-CM | POA: Diagnosis not present

## 2023-08-17 DIAGNOSIS — N179 Acute kidney failure, unspecified: Secondary | ICD-10-CM | POA: Diagnosis not present

## 2023-08-17 DIAGNOSIS — A419 Sepsis, unspecified organism: Secondary | ICD-10-CM | POA: Diagnosis not present

## 2023-08-17 DIAGNOSIS — J9601 Acute respiratory failure with hypoxia: Secondary | ICD-10-CM | POA: Diagnosis not present

## 2023-08-17 NOTE — Telephone Encounter (Signed)
 May I give the verbal orders ?

## 2023-08-17 NOTE — Telephone Encounter (Signed)
Can I give the wound care orders ?

## 2023-08-17 NOTE — Telephone Encounter (Signed)
Verbal orders given  

## 2023-08-17 NOTE — Telephone Encounter (Signed)
Copied from CRM [id number]. Topic: Clinical - Home Health Verbal Orders >> Aug 17, 2023 11:51 AM Irine Seal wrote: Caller/Agency[facility] Callback Number: [phone] Service Requested: Physical Therapy Frequency: 1 week, 4  Any new concerns about the patient? No

## 2023-08-18 ENCOUNTER — Telehealth: Payer: Self-pay

## 2023-08-18 NOTE — Telephone Encounter (Signed)
Copied from CRM [id number]. Topic: Clinical - Medication Question >> Aug 18, 2023  1:55 PM Geneva B wrote: Reason for CRM: pt wife called in and wanted to know if the pt can get cough medication pt seen dr and was told he would have an rx sent over please call wife [phone] 

## 2023-08-21 ENCOUNTER — Other Ambulatory Visit: Payer: Self-pay

## 2023-08-21 ENCOUNTER — Telehealth: Payer: Self-pay | Admitting: Radiology

## 2023-08-21 DIAGNOSIS — C61 Malignant neoplasm of prostate: Secondary | ICD-10-CM

## 2023-08-21 NOTE — Telephone Encounter (Signed)
Copied from CRM [id number]. Topic: Clinical - Home Health Verbal Orders >> Aug 21, 2023  3:39 PM Almira Coaster wrote: Caller/Agency: Inetta Fermo [facility]) Callback Number: [phone] Service Requested: Inetta Fermo called to inform that she evaluated patient and still hears the rhonchi in lungs and if there is anything Dr.[name] can prescribe.  Frequency:  Any new concerns about the patient? No

## 2023-08-22 ENCOUNTER — Inpatient Hospital Stay (HOSPITAL_BASED_OUTPATIENT_CLINIC_OR_DEPARTMENT_OTHER): Payer: Medicare Other | Admitting: Hematology

## 2023-08-22 ENCOUNTER — Inpatient Hospital Stay: Payer: Medicare Other | Attending: Hematology

## 2023-08-22 ENCOUNTER — Inpatient Hospital Stay: Payer: Medicare Other

## 2023-08-22 VITALS — BP 111/68 | HR 86 | Temp 97.9°F | Resp 15 | Wt 163.1 lb

## 2023-08-22 DIAGNOSIS — Z5111 Encounter for antineoplastic chemotherapy: Secondary | ICD-10-CM | POA: Diagnosis not present

## 2023-08-22 DIAGNOSIS — Z79899 Other long term (current) drug therapy: Secondary | ICD-10-CM | POA: Diagnosis not present

## 2023-08-22 DIAGNOSIS — Z7189 Other specified counseling: Secondary | ICD-10-CM | POA: Diagnosis not present

## 2023-08-22 DIAGNOSIS — C7951 Secondary malignant neoplasm of bone: Secondary | ICD-10-CM

## 2023-08-22 DIAGNOSIS — C61 Malignant neoplasm of prostate: Secondary | ICD-10-CM

## 2023-08-22 LAB — CBC WITH DIFFERENTIAL (CANCER CENTER ONLY)
Abs Immature Granulocytes: 0.04 10*3/uL (ref 0.00–0.07)
Basophils Absolute: 0 10*3/uL (ref 0.0–0.1)
Basophils Relative: 0 %
Eosinophils Absolute: 0.3 10*3/uL (ref 0.0–0.5)
Eosinophils Relative: 2 %
HCT: 41.5 % (ref 39.0–52.0)
Hemoglobin: 13.3 g/dL (ref 13.0–17.0)
Immature Granulocytes: 0 %
Lymphocytes Relative: 15 %
Lymphs Abs: 1.9 10*3/uL (ref 0.7–4.0)
MCH: 29.2 pg (ref 26.0–34.0)
MCHC: 32 g/dL (ref 30.0–36.0)
MCV: 91 fL (ref 80.0–100.0)
Monocytes Absolute: 1.2 10*3/uL — ABNORMAL HIGH (ref 0.1–1.0)
Monocytes Relative: 10 %
Neutro Abs: 8.7 10*3/uL — ABNORMAL HIGH (ref 1.7–7.7)
Neutrophils Relative %: 73 %
Platelet Count: 299 10*3/uL (ref 150–400)
RBC: 4.56 MIL/uL (ref 4.22–5.81)
RDW: 15.7 % — ABNORMAL HIGH (ref 11.5–15.5)
WBC Count: 12.2 10*3/uL — ABNORMAL HIGH (ref 4.0–10.5)
nRBC: 0 % (ref 0.0–0.2)

## 2023-08-22 LAB — CMP (CANCER CENTER ONLY)
ALT: 9 U/L (ref 0–44)
AST: 13 U/L — ABNORMAL LOW (ref 15–41)
Albumin: 3.2 g/dL — ABNORMAL LOW (ref 3.5–5.0)
Alkaline Phosphatase: 120 U/L (ref 38–126)
Anion gap: 6 (ref 5–15)
BUN: 21 mg/dL (ref 8–23)
CO2: 27 mmol/L (ref 22–32)
Calcium: 8.8 mg/dL — ABNORMAL LOW (ref 8.9–10.3)
Chloride: 108 mmol/L (ref 98–111)
Creatinine: 1.41 mg/dL — ABNORMAL HIGH (ref 0.61–1.24)
GFR, Estimated: 46 mL/min — ABNORMAL LOW (ref >60)
Glucose, Bld: 208 mg/dL — ABNORMAL HIGH (ref 70–99)
Potassium: 5.2 mmol/L — ABNORMAL HIGH (ref 3.5–5.1)
Sodium: 141 mmol/L (ref 135–145)
Total Bilirubin: 0.4 mg/dL (ref <1.2)
Total Protein: 6 g/dL — ABNORMAL LOW (ref 6.5–8.1)

## 2023-08-22 MED ORDER — LEUPROLIDE ACETATE (3 MONTH) 22.5 MG ~~LOC~~ KIT
22.5000 mg | PACK | Freq: Once | SUBCUTANEOUS | Status: AC
  Administered 2023-08-22: 22.5 mg via SUBCUTANEOUS

## 2023-08-22 NOTE — Progress Notes (Signed)
HEMATOLOGY/ONCOLOGY CLINIC NOTE  Date of Service: 08/22/2023  Patient Care Team: Etta Grandchild, MD as PCP - General Eldridge Dace Donnie Coffin, MD as PCP - Cardiology (Cardiology) Szabat, Vinnie Level, [facility] (Inactive) as Pharmacist (Pharmacist) Stephannie Li, MD as Consulting Physician (Ophthalmology) Johney Maine, MD as Consulting Physician (Hematology)  CHIEF COMPLAINTS/PURPOSE OF CONSULTATION:  F/u for continued management of poorly differentiated prostate cancer   HISTORY OF PRESENTING ILLNESS:  Nicholas Burton is a wonderful 89 y.o. male who has been referred to [place] by Dr Syliva Overman MD for evaluation and management of metastatic poorly differentiated prostate cancer with oligometastatic disease to the L3.   He has a history of hypertension, chronic diastolic heart failure, dyslipidemia, diabetes type 2, dementia and was admitted to [facility] with acute encephalopathy 10/05/2022.   Patient had a CT of the head which showed no acute intracranial findings CT of the lumbar spine was done which showed a lytic destructive pattern involving L2-L3 and L4 vertebral bodies concerning for possible discitis versus osteomyelitis versus tumor.   MRI lumbar spine on 10/05/2022 showed Findings concerning for osteomyelitis discitis at the L3-4 level. Abnormal enhancing material within the ventral epidural space at the level of L3, concerning for epidural phlegmon. These changes superimposed on underlying spondylosis and facet arthrosis resultant severe spinal stenosis. While these changes are favored to reflect infection, a possible neoplastic process involving the L3 vertebral body remains difficult to exclude. Rebello interval follow-up imaging may be helpful for further evaluation as warranted.  This was treated with empiric antibiotics and is being followed by infectious disease. Patient subsequently underwent a biopsy of L3 10/07/2022 which showed metastatic poorly differentiated  prostate cancer.   Patient subsequently had a CT of the chest abdomen pelvis without contrast at our recommendation which was done on 10/11/2022 and showed no evidence of metastatic disease in the chest abdomen or pelvis.  Diffuse bladder wall thickening with surrounding inflammatory stranding worrisome for cystitis.  Stable abnormal appearance of L3 and nonspecific presacral edema.  INTERVAL HISTORY  Nicholas Burton is a wonderful 89 y.o. male who is here for continued evaluation and management of metastatic poorly differentiated prostate cancer with oligometastatic disease to the L3.   Patient was last seen by me on 05/30/2023 and reported hearing/hearing aid issues, ringing in the ear, and lower back pain.   Today, he is accompanied by his son. Patient reports ear and vision issues. Patient was in the hospital last month for COVID and UTI for 4 days and was in rehabilitation for 3 weeks. He denies endorsing a fever in the last few weeks.   He reports that his COVID infection mildly affected his breathing and his breathing habits have been okay recently.   Patient reports that he has overall nearly returned to baseline and his appetite has returned. He reports that his breathing has not yet reached his baseline, but has improved. He reports that he continues to taste food okay.   Patient denies any significant back pain or abdominal pain. Patient denies any leg swelling, or pain over the kidneys.   Patient reports having a recent fall with injury to the left arm and right elbow.   His urinary catherater gets changed every 30 days and he regularly follows with his urologist.   Patient has a hx of AMD and his son notes that patient has not been receiving eye injections for a couple of months.   His wife has considered moving patient into  a nursing home.   Patient denies any other complaints.   Patient's PCP is Dr. Yetta Barre at [facility].   He has an upcoming appt with audiologist.   Patient has  been regularly taking Xtandi 80mg /d  with no toxicity issues.   MEDICAL HISTORY:  Past Medical History:  Diagnosis Date   Allergy    ANEMIA ASSOCIATED W/OTHER [facility] NUTRITIONAL DEFIC 08/11/2010   Qualifier: Diagnosis of  By: Yetta Barre MD, Bernadene Bell.    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    History of skin cancer    Hyperlipidemia with target LDL less than 100 11/10/2008        Hypertension    Memory loss 05/14/2009   Obesity (BMI 30.0-34.9) 04/01/2013   Osteoarthritis    PSA elevation 10/25/2018   SKIN CANCER, HX OF 11/10/2008   Qualifier: Diagnosis of  By: Yetta Barre MD, Bernadene Bell.    Snoring 04/03/2014   Type II diabetes mellitus with manifestations (HCC) 11/10/2008   Estimated Creatinine Clearance: 38 mL/min (A) (by C-G formula based on SCr of 1.52 mg/dL (H)).    Venous stasis dermatitis of both lower extremities 01/29/2018   Vitamin D deficiency 11/10/2008    SURGICAL HISTORY: Past Surgical History:  Procedure Laterality Date   CHOLECYSTECTOMY     INGUINAL HERNIA REPAIR     x 3   IR FLUORO GUIDED NEEDLE PLC ASPIRATION/INJECTION LOC  10/07/2022   JOINT REPLACEMENT     KNEE SURGERY     x 2   MELANOMA EXCISION     x 3 -- Left arm   TOTAL KNEE ARTHROPLASTY     x 3    SOCIAL HISTORY: Social History   Socioeconomic History   Marital status: Married    Spouse name: Aurea Graff   Number of children: 4   Years of education: Not on file   Highest education level: Not on file  Occupational History   Occupation: retired    Associate Professor: RETIRED  Tobacco Use   Smoking status: Never   Smokeless tobacco: Never  Vaping Use   Vaping status: Never Used   Substance and Sexual Activity   Alcohol use: No   Drug use: No   Sexual activity: Yes    Birth control/protection: None  Other Topics Concern   Not on file  Social History Narrative   No regular exercise   Lives with wife.     Social Drivers of Corporate investment banker Strain: Low Risk  (07/13/2023)   Overall Financial Resource Strain (CARDIA)    Difficulty of Paying Living Expenses: Not hard at all  Food Insecurity: No Food Insecurity (07/19/2023)   Hunger Vital Sign    Worried About Running Out of Food in the Last Year: Never true    Ran Out of Food in the Last Year: Never true  Transportation Needs: No Transportation Needs (07/19/2023)   PRAPARE - Administrator, Civil Service (Medical): No    Lack of Transportation (Non-Medical): No  Physical Activity: Sufficiently Active (07/13/2023)   Exercise Vital Sign    Days of Exercise  per Week: 6 days    Minutes of Exercise per Session: 30 min  Stress: Patient Unable To Answer (07/13/2023)   [place] [employer] - Occupational Stress Questionnaire    Feeling of Stress : Patient unable to answer  Social Connections: Moderately Isolated (07/13/2023)   Social Connection and Isolation Panel [NHANES]    Frequency of Communication with Friends and Family: Never    Frequency of Social Gatherings with Friends and Family: Never    Attends Religious Services: Never    Database administrator or Organizations: Yes    Attends Banker Meetings: Never    Marital Status: Married  Catering manager Violence: Not At Risk (07/19/2023)   Humiliation, Afraid, Rape, and Kick questionnaire    Fear of Current or Ex-Partner: No    Emotionally Abused: No    Physically Abused: No    Sexually Abused: No    FAMILY HISTORY: Family History  Problem Relation Age of Onset   Heart disease Mother    Diabetes Mother    Heart disease Father    Cancer Sister    Cancer Brother    Diabetes Son    Diabetes  Other     ALLERGIES:  is allergic to ace inhibitors, oxycodone, and penicillins.  MEDICATIONS:  Current Outpatient Medications  Medication Sig Dispense Refill   acetaminophen (TYLENOL) 325 MG tablet Take 1-2 tablets (325-650 mg total) by mouth every 6 (six) hours as needed for mild pain.     cholecalciferol (VITAMIN D) 1000 units tablet Take 1 tablet (1,000 Units total) by mouth daily. (Patient taking differently: Take 1,000 Units by mouth every evening.) 30 tablet 0   Colesevelam HCl 3.75 g PACK Take 1 packet by mouth daily. 60 packet 1   cyanocobalamin (VITAMIN B12) 500 MCG tablet Take 1 tablet (500 mcg total) by mouth daily. 30 tablet 0   guaiFENesin-dextromethorphan (ROBITUSSIN DM) 100-10 MG/5ML syrup Take 5 mLs by mouth every 4 (four) hours as needed for cough.     insulin aspart (NOVOLOG) 100 UNIT/ML injection Inject 0-9 Units into the skin 3 (three) times daily with meals. Sliding scale CBG 70 - 120: 0 units CBG 121 - 150: 1 unit,  CBG 151 - 200: 2 units,  CBG 201 - 250: 3 units,  CBG 251 - 300: 5 units,  CBG 301 - 350: 7 units,  CBG 351 - 400: 9 units   CBG > 400: 9 units and notify your MD     Insulin Pen Needle (NOVOFINE) 30G X 8 MM MISC Inject 10 each into the skin as needed. 100 each 1   LANTUS SOLOSTAR 100 UNIT/ML Solostar Pen Inject 23 Units into the skin at bedtime.     magnesium gluconate (MAGONATE) 500 MG tablet Take 1 tablet (500 mg total) by mouth at bedtime. 30 tablet 0   melatonin 3 MG TABS tablet Take 1 tablet (3 mg total) by mouth at bedtime as needed. 30 tablet 0   metoprolol tartrate (LOPRESSOR) 25 MG tablet TAKE 1/2 TABLET BY MOUTH TWICE DAILY 90 tablet 1   Multiple Vitamins-Minerals (PRESERVISION/LUTEIN) CAPS Take 1 capsule by mouth 2 (two) times daily.     Potassium Chloride ER 20 MEQ TBCR Take 1 tablet by mouth 3 (three) times daily.     tamsulosin (FLOMAX) 0.4 MG CAPS capsule Take 1 capsule (0.4 mg total) by mouth daily. (Patient not taking: Reported on 08/16/2023)  90 capsule 1   torsemide (DEMADEX) 10 MG tablet Take 1  tablet (10 mg total) by mouth every other day. 45 tablet 2   Vibegron (GEMTESA) 75 MG TABS TAKE 1 TABLET DAILY 90 tablet 0   XTANDI 80 MG tablet TAKE 1 TABLET DAILY 30 tablet 1   No current facility-administered medications for this visit.    REVIEW OF SYSTEMS:    10 Point review of Systems was done is negative except as noted above.   PHYSICAL EXAMINATION: ECOG PERFORMANCE STATUS: 2 - Symptomatic, <50% confined to bed  . Vitals:   08/22/23 1215  BP: 111/68  Pulse: 86  Resp: 15  Temp: 97.9 F (36.6 C)  SpO2: 100%    Filed Weights   08/22/23 1215  Weight: 163 lb 1.6 oz (74 kg)    .Body mass index is 24.09 kg/m.  GENERAL:alert, in no acute distress and comfortable SKIN: no acute rashes, no significant lesions EYES: conjunctiva are pink and non-injected, sclera anicteric OROPHARYNX: MMM, no exudates, no oropharyngeal erythema or ulceration NECK: supple, no JVD LYMPH:  no palpable lymphadenopathy in the cervical, axillary or inguinal regions LUNGS: clear to auscultation b/l with normal respiratory effort HEART: regular rate & rhythm ABDOMEN:  normoactive bowel sounds , non tender, not distended. Extremity: no pedal edema PSYCH: alert & oriented x 3 with fluent speech NEURO: no focal motor/sensory deficits   LABORATORY DATA:  I have reviewed the data as listed .    Latest Ref Rng & Units 07/19/2023    6:33 AM 07/18/2023    7:04 PM 05/30/2023    2:06 PM  CBC  WBC 4.0 - 10.5 K/uL 11.6  16.7  11.7   Hemoglobin 13.0 - 17.0 g/dL 25.9  56.3  87.5   Hematocrit 39.0 - 52.0 % 38.7  39.2  39.2   Platelets 150 - 400 K/uL 190  206  207    .    Latest Ref Rng & Units 07/19/2023    6:33 AM 07/18/2023    7:04 PM 05/30/2023    2:06 PM  CMP  Glucose 70 - 99 mg/dL 643  329  518   BUN 8 - 23 mg/dL 13  20  28    Creatinine 0.61 - 1.24 mg/dL 8.41  6.60  6.30   Sodium 135 - 145 mmol/L 140  137  141   Potassium 3.5 - 5.1  mmol/L 3.4  4.2  4.1   Chloride 98 - 111 mmol/L 103  101  109   CO2 22 - 32 mmol/L 27  25  26    Calcium 8.9 - 10.3 mg/dL 8.5  8.6  8.6   Total Protein 6.5 - 8.1 g/dL  5.9  5.6   Total Bilirubin <1.2 mg/dL  1.0  0.5   Alkaline Phos 38 - 126 U/L  125  105   AST 15 - 41 U/L  23  15   ALT 0 - 44 U/L  12  11        RADIOGRAPHIC STUDIES: I have personally reviewed the radiological images as listed and agreed with the findings in the report. DG Chest 2 View Result Date: 08/16/2023 CLINICAL DATA:  Subacute cough. EXAM: CHEST - 2 VIEW COMPARISON:  07/18/2023 FINDINGS: Low volume film. Cardiopericardial silhouette is at upper limits of normal for size. Small left pleural effusion is similar to prior. Right lung clear. Patient's face obscures the lung apices. Bones are diffusely demineralized. IMPRESSION: Low volume film with small left pleural effusion. Electronically Signed   By: Kennith Center M.D.   On: 08/16/2023  16:45    ASSESSMENT & PLAN:   89 year old male with no previous history of malignancy.  Previous history of hypertension, dyslipidemia, diabetes, CHF dementia though alert oriented x 3 at baseline admitted with altered mental status which is now more or less cleared significant new back pain.   #1 Pathologic L3 compression due to bone metastases from metastatic poorly differentiated prostate cancer.  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 Cannot rule out additional infection.  No cultures were sent from his L3 biopsy. PSA levels are elevated at 55.1 CT chest abdomen pelvis did not show any other sites of overt metastatic disease at this time or overt prostate lesions.   #2 history of dementia though apparently alert oriented x 3 at baseline with poor cognition.   #3 hypertension, dyslipidemia, diabetes, CHF.  PLAN:  -Discussed lab results on 08/22/23 in detail with patient. CBC showed WBC of 12.2K, hemoglobin of 13.3,  and platelets of 299K. -patient has slightly elevated WBC. His WBC was previously even higher at 16-17K while he was in the hospital one month ago -discussed that his injuries from his recent fall can be a reason for elevated WBC from inflammation -his last PSA level was 1.8 and today PSA is 1.7 --stable -his prostate cancer is stable at this time -his injury to the left arm does not appear to be infected at this time -discussed that his catheter would be a risk factor for some element of inflammation or infection  -continue Xtand 80mg  po daily with Leuprolide shots every 3 months.   -discussed that there are multiple triggers to consider hospice, including functional limitations, cognitive decline, and overall health decline exacerbated by repeat infections and falls. Discussed that patient is not limited from a prostate cancer standpoint.  -answered all of patient's and his son's questions in detail -continue to regularly follow with a urologist  FOLLOW-UP: Plz schedule next 2 doses of Lupron every 3 months per orders RTC with Dr Candise Che with labs with next dose of lupron  The total time spent in the appointment was 30 minutes* .  All of the patient's questions were answered with apparent satisfaction. The patient knows to call the clinic with any problems, questions or concerns.   Wyvonnia Lora MD MS AAHIVMS [facility] Hematology/Oncology Physician [facility]  .*Total Encounter Time as defined by the Centers for Medicare and Medicaid Services includes, in addition to the face-to-face time of a patient visit (documented in the note above) non-face-to-face time: obtaining and reviewing outside history, ordering and reviewing medications, tests or procedures, care coordination (communications with other health care professionals or caregivers) and documentation in the medical record.    I,Mitra Faeizi,acting as a Neurosurgeon for Wyvonnia Lora, MD.,have documented all relevant  documentation on the behalf of Wyvonnia Lora, MD,as directed by  Wyvonnia Lora, MD while in the presence of Wyvonnia Lora, MD.  .I have reviewed the above documentation for accuracy and completeness, and I agree with the above. Johney Maine MD

## 2023-08-22 NOTE — Patient Instructions (Signed)

## 2023-08-24 DIAGNOSIS — U071 COVID-19: Secondary | ICD-10-CM | POA: Diagnosis not present

## 2023-08-24 DIAGNOSIS — J9601 Acute respiratory failure with hypoxia: Secondary | ICD-10-CM | POA: Diagnosis not present

## 2023-08-24 DIAGNOSIS — N179 Acute kidney failure, unspecified: Secondary | ICD-10-CM | POA: Diagnosis not present

## 2023-08-24 DIAGNOSIS — A419 Sepsis, unspecified organism: Secondary | ICD-10-CM | POA: Diagnosis not present

## 2023-08-24 DIAGNOSIS — I10 Essential (primary) hypertension: Secondary | ICD-10-CM | POA: Diagnosis not present

## 2023-08-24 DIAGNOSIS — N390 Urinary tract infection, site not specified: Secondary | ICD-10-CM | POA: Diagnosis not present

## 2023-08-24 LAB — PSA, TOTAL AND FREE
PSA, Free Pct: 60.6 %
PSA, Free: 1.03 ng/mL
Prostate Specific Ag, Serum: 1.7 ng/mL (ref 0.0–4.0)

## 2023-08-24 NOTE — Telephone Encounter (Signed)
Please advise 

## 2023-08-25 ENCOUNTER — Other Ambulatory Visit: Payer: Self-pay | Admitting: Internal Medicine

## 2023-08-25 ENCOUNTER — Encounter: Payer: Self-pay | Admitting: Hematology

## 2023-08-25 DIAGNOSIS — U071 COVID-19: Secondary | ICD-10-CM

## 2023-08-25 DIAGNOSIS — J4489 Other specified chronic obstructive pulmonary disease: Secondary | ICD-10-CM

## 2023-08-25 MED ORDER — HYDROCODONE BIT-HOMATROP MBR 5-1.5 MG/5ML PO SOLN: 5.0000 mL | Freq: Three times a day (TID) | ORAL | 0 refills | Status: DC | PRN

## 2023-08-27 ENCOUNTER — Other Ambulatory Visit: Payer: Self-pay | Admitting: Internal Medicine

## 2023-08-27 DIAGNOSIS — C61 Malignant neoplasm of prostate: Secondary | ICD-10-CM

## 2023-08-29 DIAGNOSIS — U071 COVID-19: Secondary | ICD-10-CM | POA: Diagnosis not present

## 2023-08-29 DIAGNOSIS — J9601 Acute respiratory failure with hypoxia: Secondary | ICD-10-CM | POA: Diagnosis not present

## 2023-08-29 DIAGNOSIS — I10 Essential (primary) hypertension: Secondary | ICD-10-CM | POA: Diagnosis not present

## 2023-08-29 DIAGNOSIS — N390 Urinary tract infection, site not specified: Secondary | ICD-10-CM | POA: Diagnosis not present

## 2023-08-29 DIAGNOSIS — N179 Acute kidney failure, unspecified: Secondary | ICD-10-CM | POA: Diagnosis not present

## 2023-08-29 DIAGNOSIS — A419 Sepsis, unspecified organism: Secondary | ICD-10-CM | POA: Diagnosis not present

## 2023-08-30 DIAGNOSIS — G301 Alzheimer's disease with late onset: Secondary | ICD-10-CM | POA: Diagnosis not present

## 2023-09-05 ENCOUNTER — Other Ambulatory Visit: Payer: Self-pay

## 2023-09-05 DIAGNOSIS — I10 Essential (primary) hypertension: Secondary | ICD-10-CM | POA: Diagnosis not present

## 2023-09-05 DIAGNOSIS — J9601 Acute respiratory failure with hypoxia: Secondary | ICD-10-CM | POA: Diagnosis not present

## 2023-09-05 DIAGNOSIS — A419 Sepsis, unspecified organism: Secondary | ICD-10-CM | POA: Diagnosis not present

## 2023-09-05 DIAGNOSIS — N179 Acute kidney failure, unspecified: Secondary | ICD-10-CM | POA: Diagnosis not present

## 2023-09-05 DIAGNOSIS — N390 Urinary tract infection, site not specified: Secondary | ICD-10-CM | POA: Diagnosis not present

## 2023-09-05 DIAGNOSIS — U071 COVID-19: Secondary | ICD-10-CM | POA: Diagnosis not present

## 2023-09-06 DIAGNOSIS — H903 Sensorineural hearing loss, bilateral: Secondary | ICD-10-CM | POA: Diagnosis not present

## 2023-09-08 ENCOUNTER — Inpatient Hospital Stay: Payer: Medicare Other

## 2023-09-11 ENCOUNTER — Other Ambulatory Visit: Payer: Self-pay | Admitting: Internal Medicine

## 2023-09-11 DIAGNOSIS — C61 Malignant neoplasm of prostate: Secondary | ICD-10-CM

## 2023-09-12 ENCOUNTER — Other Ambulatory Visit: Payer: Self-pay | Admitting: Internal Medicine

## 2023-09-12 ENCOUNTER — Encounter (INDEPENDENT_AMBULATORY_CARE_PROVIDER_SITE_OTHER): Payer: Self-pay

## 2023-09-12 DIAGNOSIS — N179 Acute kidney failure, unspecified: Secondary | ICD-10-CM | POA: Diagnosis not present

## 2023-09-12 DIAGNOSIS — N390 Urinary tract infection, site not specified: Secondary | ICD-10-CM | POA: Diagnosis not present

## 2023-09-12 DIAGNOSIS — A419 Sepsis, unspecified organism: Secondary | ICD-10-CM | POA: Diagnosis not present

## 2023-09-12 DIAGNOSIS — I10 Essential (primary) hypertension: Secondary | ICD-10-CM | POA: Diagnosis not present

## 2023-09-12 DIAGNOSIS — J9601 Acute respiratory failure with hypoxia: Secondary | ICD-10-CM | POA: Diagnosis not present

## 2023-09-12 DIAGNOSIS — U071 COVID-19: Secondary | ICD-10-CM | POA: Diagnosis not present

## 2023-09-12 DIAGNOSIS — M545 Low back pain, unspecified: Secondary | ICD-10-CM

## 2023-09-13 DIAGNOSIS — E119 Type 2 diabetes mellitus without complications: Secondary | ICD-10-CM | POA: Diagnosis not present

## 2023-09-13 DIAGNOSIS — J4489 Other specified chronic obstructive pulmonary disease: Secondary | ICD-10-CM | POA: Diagnosis not present

## 2023-09-13 DIAGNOSIS — N179 Acute kidney failure, unspecified: Secondary | ICD-10-CM | POA: Diagnosis not present

## 2023-09-13 DIAGNOSIS — I10 Essential (primary) hypertension: Secondary | ICD-10-CM | POA: Diagnosis not present

## 2023-09-13 DIAGNOSIS — C61 Malignant neoplasm of prostate: Secondary | ICD-10-CM | POA: Diagnosis not present

## 2023-09-13 DIAGNOSIS — U071 COVID-19: Secondary | ICD-10-CM | POA: Diagnosis not present

## 2023-09-13 DIAGNOSIS — N390 Urinary tract infection, site not specified: Secondary | ICD-10-CM | POA: Diagnosis not present

## 2023-09-13 DIAGNOSIS — A419 Sepsis, unspecified organism: Secondary | ICD-10-CM | POA: Diagnosis not present

## 2023-09-13 DIAGNOSIS — G301 Alzheimer's disease with late onset: Secondary | ICD-10-CM | POA: Diagnosis not present

## 2023-09-13 DIAGNOSIS — I5042 Chronic combined systolic (congestive) and diastolic (congestive) heart failure: Secondary | ICD-10-CM | POA: Diagnosis not present

## 2023-09-13 DIAGNOSIS — J9601 Acute respiratory failure with hypoxia: Secondary | ICD-10-CM | POA: Diagnosis not present

## 2023-09-13 DIAGNOSIS — R339 Retention of urine, unspecified: Secondary | ICD-10-CM | POA: Diagnosis not present

## 2023-09-13 DIAGNOSIS — C7951 Secondary malignant neoplasm of bone: Secondary | ICD-10-CM | POA: Diagnosis not present

## 2023-09-14 ENCOUNTER — Ambulatory Visit: Payer: Self-pay | Admitting: Internal Medicine

## 2023-09-14 DIAGNOSIS — U071 COVID-19: Secondary | ICD-10-CM | POA: Diagnosis not present

## 2023-09-14 DIAGNOSIS — N390 Urinary tract infection, site not specified: Secondary | ICD-10-CM | POA: Diagnosis not present

## 2023-09-14 DIAGNOSIS — I10 Essential (primary) hypertension: Secondary | ICD-10-CM | POA: Diagnosis not present

## 2023-09-14 DIAGNOSIS — J9601 Acute respiratory failure with hypoxia: Secondary | ICD-10-CM | POA: Diagnosis not present

## 2023-09-14 DIAGNOSIS — N179 Acute kidney failure, unspecified: Secondary | ICD-10-CM | POA: Diagnosis not present

## 2023-09-14 DIAGNOSIS — A419 Sepsis, unspecified organism: Secondary | ICD-10-CM | POA: Diagnosis not present

## 2023-09-14 NOTE — Telephone Encounter (Addendum)
Please call [facility] nurse Kiki with orders/updates [phone]  Chief Complaint: Burning when urinating Symptoms: Burning with urination Frequency: Pt reports "this has happened on and off for the last 2-3 years" Pertinent Negatives: Patient denies fever, abd pain, odor, N/V Disposition: [] ED /[] Urgent Care (no appt availability in office) / [] Appointment(In office/virtual)/ []  [place] [facility]/ [] Home Care/ [] Refused Recommended Disposition /[] [place] [facility]/ [x]  Follow-up with PCP Additional Notes: Pt's HH nurse Fatima Blank reports this is her first time seeing pt, during her visit pt reports burning at insertion site of catheter with urination. When [facility] nurse asked him how long this has been going on he states "this has been happening on and off for 2-3 years". Pt denies fever, abd pain, foul odor, N/V. HH nurse reports urine is clear yellow. Forwarding to provider for review. [facility] nurse also requests scripts be sent for Novolog pens instead of the vials as the pt has not been getting his Novolog as his wife is not able to manipulate the vial/syringe to give it. Pt needs refills on his Libre sensors and wife is requesting a referral to Endocrinology. Pt is scheduled for an appt Monday. This RN educated on home care, new-worsening symptoms, when to call back. Verbalized understanding and agrees to plan.   Copied from CRM [id number]. Topic: Clinical - Red Word Triage >> Sep 14, 2023  3:01 PM [place] A wrote: [facility] that prompted transfer to Nurse Triage: Patient is experiencing burning when he urinate with Catheter; [facility] nurse is there with him and his spouse now Reason for Disposition  Leakage of urine around catheter    Burning  Answer Assessment - Initial Assessment Questions 1. SYMPTOMS: "What symptoms are you concerned about?"     Burning with urination 2. ONSET:  "When did the symptoms start?"     On and off "for a couple years" per [facility] nurse 3. FEVER: "Is there a fever?" If Yes, ask:  "What is the temperature, how was it measured, and when did it start?"     None 4. ABDOMEN PAIN: "Is there any abdomen pain?" (e.g., Scale 1-10; or mild, moderate, severe)     None 5. URINE COLOR: "What color is the urine?"  "Is there blood present in the urine?" (e.g., clear, yellow, cloudy, tea-colored, blood streaks, bright red)     Clear yellow 6. URINE AMOUNT: "When did you last empty the urine from the collection bag?" "How much urine was in the bag at that time?" How much urine is in the collection bag now?"     WNL 7. INSERTION: "How long have you (they) had the catheter?"     Changed monthly,due to be changed Monday 8. OTHER SYMPTOMS: "Are there any other symptoms?" (e.g., abdomen swelling, back pain, bladder spasms, constipation, foul smelling urine, leaking of urine)      None 9. MEDICINES: "Are you taking any medicines to treat urinary problems?" (e.g., antibiotics for a urinary tract infection, medicines to treat bladder spasms)      Gentesa  Protocols used: Urinary Catheter (e.g., Foley) Symptoms and Questions-A-AH

## 2023-09-18 DIAGNOSIS — R338 Other retention of urine: Secondary | ICD-10-CM | POA: Diagnosis not present

## 2023-09-19 DIAGNOSIS — J9601 Acute respiratory failure with hypoxia: Secondary | ICD-10-CM | POA: Diagnosis not present

## 2023-09-19 DIAGNOSIS — I10 Essential (primary) hypertension: Secondary | ICD-10-CM | POA: Diagnosis not present

## 2023-09-19 DIAGNOSIS — U071 COVID-19: Secondary | ICD-10-CM | POA: Diagnosis not present

## 2023-09-19 DIAGNOSIS — N390 Urinary tract infection, site not specified: Secondary | ICD-10-CM | POA: Diagnosis not present

## 2023-09-19 DIAGNOSIS — A419 Sepsis, unspecified organism: Secondary | ICD-10-CM | POA: Diagnosis not present

## 2023-09-19 DIAGNOSIS — N179 Acute kidney failure, unspecified: Secondary | ICD-10-CM | POA: Diagnosis not present

## 2023-09-20 DIAGNOSIS — N390 Urinary tract infection, site not specified: Secondary | ICD-10-CM | POA: Diagnosis not present

## 2023-09-20 DIAGNOSIS — A419 Sepsis, unspecified organism: Secondary | ICD-10-CM | POA: Diagnosis not present

## 2023-09-20 DIAGNOSIS — J9601 Acute respiratory failure with hypoxia: Secondary | ICD-10-CM | POA: Diagnosis not present

## 2023-09-20 DIAGNOSIS — I10 Essential (primary) hypertension: Secondary | ICD-10-CM | POA: Diagnosis not present

## 2023-09-20 DIAGNOSIS — U071 COVID-19: Secondary | ICD-10-CM | POA: Diagnosis not present

## 2023-09-20 DIAGNOSIS — N179 Acute kidney failure, unspecified: Secondary | ICD-10-CM | POA: Diagnosis not present

## 2023-09-21 ENCOUNTER — Other Ambulatory Visit: Payer: Self-pay | Admitting: Internal Medicine

## 2023-09-21 DIAGNOSIS — E785 Hyperlipidemia, unspecified: Secondary | ICD-10-CM

## 2023-09-21 DIAGNOSIS — E119 Type 2 diabetes mellitus without complications: Secondary | ICD-10-CM

## 2023-09-21 DIAGNOSIS — N400 Enlarged prostate without lower urinary tract symptoms: Secondary | ICD-10-CM

## 2023-09-21 DIAGNOSIS — I10 Essential (primary) hypertension: Secondary | ICD-10-CM

## 2023-09-21 MED ORDER — LANTUS SOLOSTAR 100 UNIT/ML ~~LOC~~ SOPN: 23.0000 [IU] | PEN_INJECTOR | Freq: Every day | SUBCUTANEOUS | 0 refills | Status: DC

## 2023-09-21 MED ORDER — TORSEMIDE 10 MG PO TABS: 10.0000 mg | ORAL_TABLET | ORAL | 2 refills | Status: DC

## 2023-09-21 MED ORDER — TAMSULOSIN HCL 0.4 MG PO CAPS: 0.4000 mg | ORAL_CAPSULE | Freq: Every day | ORAL | 1 refills | Status: DC

## 2023-09-21 MED ORDER — COLESEVELAM HCL 3.75 G PO PACK: 1.0000 | PACK | Freq: Every day | ORAL | 1 refills | Status: DC

## 2023-09-21 NOTE — Telephone Encounter (Signed)
Copied from CRM [id number]. Topic: Clinical - Medication Refill >> Sep 21, 2023  1:32 PM Louie Boston wrote: Most Recent Primary Care Visit:  Provider: Etta Grandchild  Department: [facility]  Visit Type: OFFICE VISIT  Date: 08/16/2023  Medication: LANTUS SOLOSTAR 100 UNIT/ML Solostar Pen, Colesevelam HCl 3.75 g PACK , Potassium Chloride ER 20 MEQ TBCR, tamsulosin (FLOMAX) 0.4 MG CAPS capsule, torsemide (DEMADEX) 10 MG tablet  Has the patient contacted their pharmacy? Yes (Agent: If no, request that the patient contact the pharmacy for the refill. If patient does not wish to contact the pharmacy document the reason why and proceed with request.) (Agent: If yes, when and what did the pharmacy advise?)  Patient does not have refills available.   Is this the correct pharmacy for this prescription? Yes If no, delete pharmacy and type the correct one.  This is the patient's preferred pharmacy:  [facility] [address] [place], [place] - [address] AT [facility] [address] [place] [address] Phone: [phone] Fax: [phone]   Has the prescription been filled recently? No  Is the patient out of the medication? Yes  Has the patient been seen for an appointment in the last year OR does the patient have an upcoming appointment? Yes  Can we respond through MyChart? Yes  Agent: Please be advised that Rx refills may take up to 3 business days. We ask that you follow-up with your pharmacy.

## 2023-09-21 NOTE — Telephone Encounter (Signed)
Last Fill: Colesevelam:02/06/2023      LANTUS: 07/22/2023     Tamsulosin: 02/20/2023    Torsemide: 02/06/2023   Last OV: 08/16/2023 Next OV: None Scheduled  Routing to provider for review/authorization.

## 2023-09-27 DIAGNOSIS — J9601 Acute respiratory failure with hypoxia: Secondary | ICD-10-CM | POA: Diagnosis not present

## 2023-09-27 DIAGNOSIS — N390 Urinary tract infection, site not specified: Secondary | ICD-10-CM | POA: Diagnosis not present

## 2023-09-27 DIAGNOSIS — A419 Sepsis, unspecified organism: Secondary | ICD-10-CM | POA: Diagnosis not present

## 2023-09-27 DIAGNOSIS — U071 COVID-19: Secondary | ICD-10-CM | POA: Diagnosis not present

## 2023-09-27 DIAGNOSIS — I10 Essential (primary) hypertension: Secondary | ICD-10-CM | POA: Diagnosis not present

## 2023-09-27 DIAGNOSIS — N179 Acute kidney failure, unspecified: Secondary | ICD-10-CM | POA: Diagnosis not present

## 2023-09-28 DIAGNOSIS — J9601 Acute respiratory failure with hypoxia: Secondary | ICD-10-CM | POA: Diagnosis not present

## 2023-09-28 DIAGNOSIS — U071 COVID-19: Secondary | ICD-10-CM | POA: Diagnosis not present

## 2023-09-28 DIAGNOSIS — N179 Acute kidney failure, unspecified: Secondary | ICD-10-CM | POA: Diagnosis not present

## 2023-09-28 DIAGNOSIS — A419 Sepsis, unspecified organism: Secondary | ICD-10-CM | POA: Diagnosis not present

## 2023-09-28 DIAGNOSIS — N390 Urinary tract infection, site not specified: Secondary | ICD-10-CM | POA: Diagnosis not present

## 2023-09-28 DIAGNOSIS — I10 Essential (primary) hypertension: Secondary | ICD-10-CM | POA: Diagnosis not present

## 2023-09-30 DIAGNOSIS — G301 Alzheimer's disease with late onset: Secondary | ICD-10-CM | POA: Diagnosis not present

## 2023-10-04 DIAGNOSIS — N179 Acute kidney failure, unspecified: Secondary | ICD-10-CM | POA: Diagnosis not present

## 2023-10-04 DIAGNOSIS — U071 COVID-19: Secondary | ICD-10-CM | POA: Diagnosis not present

## 2023-10-04 DIAGNOSIS — A419 Sepsis, unspecified organism: Secondary | ICD-10-CM | POA: Diagnosis not present

## 2023-10-04 DIAGNOSIS — I10 Essential (primary) hypertension: Secondary | ICD-10-CM | POA: Diagnosis not present

## 2023-10-04 DIAGNOSIS — J9601 Acute respiratory failure with hypoxia: Secondary | ICD-10-CM | POA: Diagnosis not present

## 2023-10-04 DIAGNOSIS — N390 Urinary tract infection, site not specified: Secondary | ICD-10-CM | POA: Diagnosis not present

## 2023-10-06 ENCOUNTER — Telehealth: Payer: Self-pay | Admitting: Internal Medicine

## 2023-10-06 NOTE — Telephone Encounter (Signed)
 Copied from CRM [id number]. Topic: Clinical - Home Health Verbal Orders >> Oct 06, 2023  2:28 PM Graeme ORN wrote: Caller/Agency: [facility] Callback Number: [phone] Service Requested: Occupational Therapy - Needs verbal order to delay OT discharge until Friday 10/13/2023 due to change in schedule  Frequency: N/A Any new concerns about the patient? N/A

## 2023-10-09 NOTE — Telephone Encounter (Signed)
 Verbal orders has been given

## 2023-10-09 NOTE — Telephone Encounter (Signed)
 May I give the verbals ?

## 2023-10-10 DIAGNOSIS — A419 Sepsis, unspecified organism: Secondary | ICD-10-CM | POA: Diagnosis not present

## 2023-10-10 DIAGNOSIS — U071 COVID-19: Secondary | ICD-10-CM | POA: Diagnosis not present

## 2023-10-10 DIAGNOSIS — J9601 Acute respiratory failure with hypoxia: Secondary | ICD-10-CM | POA: Diagnosis not present

## 2023-10-10 DIAGNOSIS — N179 Acute kidney failure, unspecified: Secondary | ICD-10-CM | POA: Diagnosis not present

## 2023-10-10 DIAGNOSIS — I10 Essential (primary) hypertension: Secondary | ICD-10-CM | POA: Diagnosis not present

## 2023-10-10 DIAGNOSIS — N390 Urinary tract infection, site not specified: Secondary | ICD-10-CM | POA: Diagnosis not present

## 2023-10-11 ENCOUNTER — Inpatient Hospital Stay: Payer: Medicare Other | Admitting: Hematology

## 2023-10-11 ENCOUNTER — Inpatient Hospital Stay: Payer: Medicare Other

## 2023-10-17 ENCOUNTER — Encounter (INDEPENDENT_AMBULATORY_CARE_PROVIDER_SITE_OTHER): Payer: Self-pay

## 2023-10-17 ENCOUNTER — Encounter: Payer: Self-pay | Admitting: Internal Medicine

## 2023-10-17 ENCOUNTER — Telehealth: Payer: Self-pay

## 2023-10-17 ENCOUNTER — Ambulatory Visit (INDEPENDENT_AMBULATORY_CARE_PROVIDER_SITE_OTHER): Payer: Medicare Other | Admitting: Otolaryngology

## 2023-10-17 VITALS — BP 130/79 | HR 69 | Ht 68.0 in | Wt 160.0 lb

## 2023-10-17 DIAGNOSIS — H60331 Swimmer's ear, right ear: Secondary | ICD-10-CM

## 2023-10-17 DIAGNOSIS — H6121 Impacted cerumen, right ear: Secondary | ICD-10-CM

## 2023-10-17 DIAGNOSIS — H903 Sensorineural hearing loss, bilateral: Secondary | ICD-10-CM

## 2023-10-17 NOTE — Telephone Encounter (Signed)
 Copied from CRM [id number]. Topic: Clinical - Request for Lab/Test Order >> Oct 17, 2023  1:32 PM Deaijah H wrote: Reason for CRM: Patient son Joas called in to have TB test completed to move in Assisted Living also needs to have dad in first week of march / please call [phone]

## 2023-10-18 ENCOUNTER — Institutional Professional Consult (permissible substitution) (INDEPENDENT_AMBULATORY_CARE_PROVIDER_SITE_OTHER): Payer: Medicare Other

## 2023-10-18 NOTE — Telephone Encounter (Signed)
 Patient has been scheduled

## 2023-10-18 NOTE — Telephone Encounter (Signed)
 LVM for pts son Yan to call the clinic back to get pt scheduled for a nurse visit to have TB skin test or ask to have a lab order for the blood version (QUANT GOLD) TB test. If blood work version is requested pt needs to be scheduled with the lab to have this done.

## 2023-10-18 NOTE — Telephone Encounter (Signed)
 Patient sons called back and stated either tb test is fine he would like a call back

## 2023-10-19 DIAGNOSIS — H903 Sensorineural hearing loss, bilateral: Secondary | ICD-10-CM | POA: Insufficient documentation

## 2023-10-19 NOTE — Progress Notes (Signed)
 Patient ID: Nicholas Burton, male   DOB: 10-18-1927, 89 y.o.   MRN: [id number]  CC: Right ear infection, hearing loss  HPI:  Nicholas Burton is a 89 y.o. male who presents today with his son.  According to the patient, he has a history of bilateral progressive hearing loss.  He was fitted with bilateral hearing aids at [facility].  The patient has a history of significant occupational loud noise exposure, secondary to serving in the [employer].  The patient was recently diagnosed with a right ear infection.  He was treated with oral antibiotic.  Currently he complains of persistent right ear discomfort and pressure.  He has no previous otologic surgery.  Past Medical History:  Diagnosis Date   Allergy    ANEMIA ASSOCIATED W/OTHER [facility] NUTRITIONAL DEFIC 08/11/2010   Qualifier: Diagnosis of  By: Yetta Barre MD, Bernadene Bell.    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    History of skin cancer    Hyperlipidemia with target LDL less than 100 11/10/2008        Hypertension    Memory loss 05/14/2009   Obesity (BMI 30.0-34.9) 04/01/2013   Osteoarthritis    PSA elevation 10/25/2018   SKIN CANCER, HX OF 11/10/2008   Qualifier: Diagnosis of  By: Yetta Barre MD, Bernadene Bell.    Snoring 04/03/2014   Type II diabetes mellitus with manifestations (HCC) 11/10/2008   Estimated Creatinine Clearance: 38 mL/min (A) (by C-G formula based on SCr of 1.52 mg/dL (H)).    Venous stasis dermatitis of both lower extremities 01/29/2018   Vitamin D deficiency 11/10/2008    Past Surgical History:  Procedure Laterality Date   CHOLECYSTECTOMY     INGUINAL HERNIA REPAIR     x 3   IR  FLUORO GUIDED NEEDLE PLC ASPIRATION/INJECTION LOC  10/07/2022   JOINT REPLACEMENT     KNEE SURGERY     x 2   MELANOMA EXCISION     x 3 -- Left arm   TOTAL KNEE ARTHROPLASTY     x 3    Family History  Problem Relation Age of Onset   Heart disease Mother    Diabetes Mother    Heart disease Father    Cancer Sister    Cancer Brother    Diabetes Son    Diabetes Other     Social History:  reports that he has never smoked. He has never used smokeless tobacco. He reports that he does not drink alcohol and does not use drugs.  Allergies:  Allergies  Allergen Reactions   Ace Inhibitors Cough   Oxycodone Itching and Rash   Penicillins Itching and Rash    Prior to Admission medications   Medication Sig Start Date End Date Taking? Authorizing Provider  acetaminophen (TYLENOL) 325 MG tablet Take 1-2 tablets (325-650 mg total) by mouth every 6 (six) hours as needed for mild pain. 10/21/22  Yes Angiulli, Mcarthur Rossetti, PA-C  cholecalciferol (VITAMIN D) 1000 units tablet Take 1 tablet (1,000 Units total) by mouth daily. Patient taking differently: Take 1,000 Units by mouth every  evening. 10/21/22  Yes Angiulli, Mcarthur Rossetti, PA-C  Colesevelam HCl 3.75 g PACK Take 1 packet by mouth daily. 09/21/23  Yes Etta Grandchild, MD  cyanocobalamin (VITAMIN B12) 500 MCG tablet Take 1 tablet (500 mcg total) by mouth daily. 10/21/22  Yes Angiulli, Mcarthur Rossetti, PA-C  HYDROcodone bit-homatropine (HYCODAN) 5-1.5 MG/5ML syrup Take 5 mLs by mouth every 8 (eight) hours as needed for cough. Patient not taking: Reported on 10/17/2023 08/25/23   Etta Grandchild, MD  insulin aspart (NOVOLOG) 100 UNIT/ML injection Inject 0-9 Units into the skin 3 (three) times daily with meals. Sliding scale CBG 70 - 120: 0 units CBG 121 - 150: 1 unit,  CBG 151 - 200: 2 units,  CBG 201 - 250: 3 units,  CBG 251 - 300: 5 units,  CBG 301 - 350: 7 units,  CBG 351 - 400: 9 units   CBG > 400: 9 units and notify your MD 07/22/23  Yes Rai, Ripudeep K, MD   Insulin Pen Needle (NOVOFINE) 30G X 8 MM MISC Inject 10 each into the skin as needed. 02/20/23  Yes Etta Grandchild, MD  LANTUS SOLOSTAR 100 UNIT/ML Solostar Pen Inject 23 Units into the skin at bedtime. 09/21/23  Yes Etta Grandchild, MD  magnesium gluconate (MAGONATE) 500 MG tablet Take 1 tablet (500 mg total) by mouth at bedtime. 10/21/22  Yes Angiulli, Mcarthur Rossetti, PA-C  melatonin 3 MG TABS tablet Take 1 tablet (3 mg total) by mouth at bedtime as needed. 10/21/22  Yes Angiulli, Mcarthur Rossetti, PA-C  metoprolol tartrate (LOPRESSOR) 25 MG tablet TAKE 1/2 TABLET BY MOUTH TWICE DAILY 08/09/23  Yes Conte, Tessa N, PA-C  Multiple Vitamins-Minerals (PRESERVISION/LUTEIN) CAPS Take 1 capsule by mouth 2 (two) times daily.   Yes [provider]  Potassium Chloride ER 20 MEQ TBCR Take 1 tablet by mouth 3 (three) times daily. 08/14/23  Yes [provider]  tamsulosin (FLOMAX) 0.4 MG CAPS capsule Take 1 capsule (0.4 mg total) by mouth daily. 09/21/23  Yes Etta Grandchild, MD  torsemide (DEMADEX) 10 MG tablet Take 1 tablet (10 mg total) by mouth every other day. 09/21/23  Yes Etta Grandchild, MD  Vibegron [facility]) 75 MG TABS TAKE 1 TABLET DAILY 06/05/23  Yes Etta Grandchild, MD  XTANDI 80 MG tablet TAKE 1 TABLET DAILY 06/23/23  Yes Johney Maine, MD  guaiFENesin-dextromethorphan [facility] DM) 100-10 MG/5ML syrup Take 5 mLs by mouth every 4 (four) hours as needed for cough. Patient not taking: Reported on 10/17/2023 07/22/23   Rai, Delene Ruffini, MD    Blood pressure 130/79, pulse 69, height 5\' 8"  (1.727 m), weight 160 lb (72.6 kg), SpO2 96%. Exam: General: Communicates without difficulty, well nourished, no acute distress. Head: Normocephalic, no evidence injury, no tenderness, facial buttresses intact without stepoff. Face/sinus: No tenderness to palpation and percussion. Facial movement is normal and symmetric. Eyes: PERRL, EOMI. No scleral icterus, conjunctivae clear. Neuro: CN II exam reveals  vision grossly intact.  No nystagmus at any point of gaze. Ears: Auricles well formed without lesions.  Right ear cerumen impaction.  The left ear canal and tympanic membrane are normal.  Nose: External evaluation reveals normal support and skin without lesions.  Dorsum is intact.  Anterior rhinoscopy reveals congested mucosa over anterior aspect of inferior turbinates and intact septum.  No purulence noted. Oral:  Oral cavity and oropharynx are intact, symmetric, without erythema or edema.  Mucosa is moist without lesions. Neck: Full range of  motion without pain.  There is no significant lymphadenopathy.  No masses palpable.  Thyroid bed within normal limits to palpation.  Parotid glands and submandibular glands equal bilaterally without mass.  Trachea is midline. Neuro:  CN 2-12 grossly intact.   Procedure: Right ear cerumen disimpaction Anesthesia: None Description: Under the operating microscope, the cerumen is carefully removed with a combination of cerumen currette, alligator forceps, and suction catheters.  After the cerumen is removed, the TMs are noted to be normal.  The right ear canal is mildly inflamed.  No mass, erythema, or lesions. The patient tolerated the procedure well.    Assessment: 1.  Right ear cerumen impaction.  After the disimpaction procedure, the right ear canal is mildly inflamed.  Both tympanic membranes and middle ear spaces are noted to be normal. 2.  His recent right otitis externa has resolved. 3.  Bilateral sensorineural hearing loss.  Plan: 1.  Otomicroscopy with right ear cerumen disimpaction. 2.  The physical exam findings are reviewed with the patient and his son. 3.  Dry ear precautions on the right side. 4.  Continue the use of his hearing aids. 5.  The patient will return for reevaluation in 2 months.  Palmer Fahrner W Elky Funches 10/19/2023, 9:01 AM

## 2023-10-20 ENCOUNTER — Other Ambulatory Visit: Payer: Self-pay | Admitting: Internal Medicine

## 2023-10-25 ENCOUNTER — Ambulatory Visit (INDEPENDENT_AMBULATORY_CARE_PROVIDER_SITE_OTHER): Payer: Medicare Other

## 2023-10-25 ENCOUNTER — Encounter: Payer: Self-pay | Admitting: Internal Medicine

## 2023-10-25 DIAGNOSIS — Z111 Encounter for screening for respiratory tuberculosis: Secondary | ICD-10-CM | POA: Diagnosis not present

## 2023-10-25 NOTE — Progress Notes (Signed)
 Patient is in office today for a nurse visit for PPD, per PCP's order. Patient Injection was given in the  Left arm. Patient tolerated injection well.

## 2023-10-25 NOTE — Progress Notes (Signed)
 Subjective:  Patient ID: [name], male    DOB: 09-Dec-1927  Age: 89 y.o. MRN: [id number]  CC: PPD Placement   HPI DRAYDON CLAIRMONT presents for done  Outpatient Medications Prior to Visit  Medication Sig Dispense Refill   acetaminophen (TYLENOL) 325 MG tablet Take 1-2 tablets (325-650 mg total) by mouth every 6 (six) hours as needed for mild pain.     cholecalciferol (VITAMIN D) 1000 units tablet Take 1 tablet (1,000 Units total) by mouth daily. (Patient taking differently: Take 1,000 Units by mouth every evening.) 30 tablet 0   Colesevelam HCl 3.75 g PACK Take 1 packet by mouth daily. 60 packet 1   cyanocobalamin (VITAMIN B12) 500 MCG tablet Take 1 tablet (500 mcg total) by mouth daily. 30 tablet 0   guaiFENesin-dextromethorphan (ROBITUSSIN DM) 100-10 MG/5ML syrup Take 5 mLs by mouth every 4 (four) hours as needed for cough.     HYDROcodone bit-homatropine (HYCODAN) 5-1.5 MG/5ML syrup Take 5 mLs by mouth every 8 (eight) hours as needed for cough. 120 mL 0   insulin aspart (NOVOLOG) 100 UNIT/ML injection Inject 0-9 Units into the skin 3 (three) times daily with meals. Sliding scale CBG 70 - 120: 0 units CBG 121 - 150: 1 unit,  CBG 151 - 200: 2 units,  CBG 201 - 250: 3 units,  CBG 251 - 300: 5 units,  CBG 301 - 350: 7 units,  CBG 351 - 400: 9 units   CBG > 400: 9 units and notify your MD     Insulin Pen Needle (NOVOFINE) 30G X 8 MM MISC Inject 10 each into the skin as needed. 100 each 1   LANTUS SOLOSTAR 100 UNIT/ML Solostar Pen Inject 23 Units into the skin at bedtime. 21 mL 0   magnesium gluconate (MAGONATE) 500 MG tablet Take 1 tablet (500 mg total) by mouth at bedtime. 30 tablet 0   melatonin 3 MG TABS tablet Take 1 tablet (3 mg total) by mouth at bedtime as needed. 30 tablet 0   metoprolol tartrate (LOPRESSOR) 25 MG tablet TAKE 1/2 TABLET BY MOUTH TWICE DAILY 90 tablet 1   Multiple Vitamins-Minerals (PRESERVISION/LUTEIN) CAPS Take 1 capsule by mouth 2 (two) times daily.     Potassium  Chloride ER 20 MEQ TBCR Take 1 tablet by mouth 3 (three) times daily.     tamsulosin (FLOMAX) 0.4 MG CAPS capsule Take 1 capsule (0.4 mg total) by mouth daily. 90 capsule 1   torsemide (DEMADEX) 10 MG tablet Take 1 tablet (10 mg total) by mouth every other day. 45 tablet 2   Vibegron (GEMTESA) 75 MG TABS TAKE 1 TABLET DAILY 90 tablet 0   XTANDI 80 MG tablet TAKE 1 TABLET DAILY 30 tablet 1   No facility-administered medications prior to visit.    ROS Review of Systems  Objective:  There were no vitals taken for this visit.  BP Readings from Last 3 Encounters:  10/17/23 130/79  08/22/23 111/68  08/16/23 110/70    Wt Readings from Last 3 Encounters:  10/17/23 160 lb (72.6 kg)  08/22/23 163 lb 1.6 oz (74 kg)  08/16/23 164 lb (74.4 kg)    Physical Exam  Lab Results  Component Value Date   WBC 12.2 (H) 08/22/2023   HGB 13.3 08/22/2023   HCT 41.5 08/22/2023   PLT 299 08/22/2023   GLUCOSE 208 (H) 08/22/2023   CHOL 83 09/17/2019   TRIG 136.0 09/17/2019   HDL 36.40 (L) 09/17/2019  LDLCALC 19 09/17/2019   ALT 9 08/22/2023   AST 13 (L) 08/22/2023   NA 141 08/22/2023   K 5.2 (H) 08/22/2023   CL 108 08/22/2023   CREATININE 1.41 (H) 08/22/2023   BUN 21 08/22/2023   CO2 27 08/22/2023   TSH 3.821 10/06/2022   PSA 11.21 (H) 10/30/2017   INR 1.0 07/18/2023   HGBA1C 7.9 (H) 06/22/2023   MICROALBUR 0.7 09/17/2019    CT Head Wo Contrast Result Date: 07/18/2023 CLINICAL DATA:  Altered mental status EXAM: CT HEAD WITHOUT CONTRAST CT CERVICAL SPINE WITHOUT CONTRAST TECHNIQUE: Multidetector CT imaging of the head and cervical spine was performed following the standard protocol without intravenous contrast. Multiplanar CT image reconstructions of the cervical spine were also generated. RADIATION DOSE REDUCTION: This exam was performed according to the departmental dose-optimization program which includes automated exposure control, adjustment of the mA and/or kV according to patient  size and/or use of iterative reconstruction technique. COMPARISON:  10/05/2022 CT head, no prior CT cervical spine, correlation is made with 04/10/2012 cervical spine radiographs FINDINGS: Evaluation of the head and cervical spine is limited by motion. CT HEAD FINDINGS Brain: No evidence of acute infarct, hemorrhage, mass, mass effect, or midline shift. No hydrocephalus or extra-axial fluid collection. Age related cerebral atrophy. Vascular: No hyperdense vessel. Atherosclerotic calcifications in the intracranial carotid and vertebral arteries. Skull: Negative for fracture or focal lesion. Sinuses/Orbits: Complete opacification of the right maxillary sinus. Mucosal thickening in the left maxillary sinus and bilateral ethmoid air cells. Status post bilateral lens replacements. Other: The mastoid air cells are well aerated. CT CERVICAL SPINE FINDINGS Alignment: No traumatic listhesis. Mild anterolisthesis of C2 on C3, C3 on C4, and C7 on T1 Skull base and vertebrae: No acute fracture or suspicious osseous lesion. Soft tissues and spinal canal: No prevertebral fluid or swelling. No visible canal hematoma. Disc levels: Degenerative changes in the cervical spine.Limited evaluation given the degree of motion. Upper chest: No focal pulmonary opacity or pleural effusion. IMPRESSION: Evaluation of the head and cervical spine is limited by motion. Within this limitation, no acute intracranial process. No acute fracture or traumatic listhesis in the cervical spine. Electronically Signed   By: Wiliam Ke M.D.   On: 07/18/2023 22:28   CT CERVICAL SPINE WO CONTRAST Result Date: 07/18/2023 CLINICAL DATA:  Altered mental status EXAM: CT HEAD WITHOUT CONTRAST CT CERVICAL SPINE WITHOUT CONTRAST TECHNIQUE: Multidetector CT imaging of the head and cervical spine was performed following the standard protocol without intravenous contrast. Multiplanar CT image reconstructions of the cervical spine were also generated. RADIATION  DOSE REDUCTION: This exam was performed according to the departmental dose-optimization program which includes automated exposure control, adjustment of the mA and/or kV according to patient size and/or use of iterative reconstruction technique. COMPARISON:  10/05/2022 CT head, no prior CT cervical spine, correlation is made with 04/10/2012 cervical spine radiographs FINDINGS: Evaluation of the head and cervical spine is limited by motion. CT HEAD FINDINGS Brain: No evidence of acute infarct, hemorrhage, mass, mass effect, or midline shift. No hydrocephalus or extra-axial fluid collection. Age related cerebral atrophy. Vascular: No hyperdense vessel. Atherosclerotic calcifications in the intracranial carotid and vertebral arteries. Skull: Negative for fracture or focal lesion. Sinuses/Orbits: Complete opacification of the right maxillary sinus. Mucosal thickening in the left maxillary sinus and bilateral ethmoid air cells. Status post bilateral lens replacements. Other: The mastoid air cells are well aerated. CT CERVICAL SPINE FINDINGS Alignment: No traumatic listhesis. Mild anterolisthesis of C2 on C3, C3  on C4, and C7 on T1 Skull base and vertebrae: No acute fracture or suspicious osseous lesion. Soft tissues and spinal canal: No prevertebral fluid or swelling. No visible canal hematoma. Disc levels: Degenerative changes in the cervical spine.Limited evaluation given the degree of motion. Upper chest: No focal pulmonary opacity or pleural effusion. IMPRESSION: Evaluation of the head and cervical spine is limited by motion. Within this limitation, no acute intracranial process. No acute fracture or traumatic listhesis in the cervical spine. Electronically Signed   By: Wiliam Ke M.D.   On: 07/18/2023 22:28   DG Chest Portable 1 View Result Date: 07/18/2023 CLINICAL DATA:  AMS Reason for exam: ams, declining mental status; best attainable images due to pt condition and inability to following directions/hold  still EXAM: PORTABLE CHEST 1 VIEW COMPARISON:  Chest x-ray 06/02/2021, CT chest 08/11/2023 FINDINGS: Slightly limited evaluation due to patient positioning. The heart and mediastinal contours are grossly unchanged. Atherosclerotic plaque No focal consolidation. No pulmonary edema. Blunting of the left costophrenic angle likely due to positioning. No pleural effusion. No pneumothorax. No acute osseous abnormality. IMPRESSION: No active disease. Slightly limited evaluation due to patient positioning. Electronically Signed   By: Tish Frederickson M.D.   On: 07/18/2023 22:23    Assessment & Plan:  Screening-pulmonary TB -     TB Skin Test     Follow-up: No follow-ups on file.  Sanda Linger, MD

## 2023-10-27 ENCOUNTER — Ambulatory Visit: Payer: Medicare Other

## 2023-10-27 DIAGNOSIS — R338 Other retention of urine: Secondary | ICD-10-CM | POA: Diagnosis not present

## 2023-10-27 LAB — TB SKIN TEST
Induration: 0 mm
TB Skin Test: NEGATIVE

## 2023-10-27 NOTE — Progress Notes (Signed)
 PPD Reading Note  PPD read and results entered.  Result: 0 mm induration.  Interpretation: Negative  If test not read within 48-72 hours of initial placement, patient advised to repeat in other arm 1-3 weeks after this test.  Allergic reaction: no

## 2023-10-31 ENCOUNTER — Ambulatory Visit: Payer: Self-pay | Admitting: Internal Medicine

## 2023-10-31 NOTE — Telephone Encounter (Signed)
 Copied From CRM [id number]. Reason for Triage: b/p 148/75,swelling in legs,hand and arms      Chief Complaint: Right lower leg swelling, Hypertension Symptoms: right lower leg swelling, bilateral hand swelling, recent hypertension Frequency: approximately 4 days Pertinent Negatives: Patient denies fever, nausea, vomiting, diarrhea, difficulty breathing, chest pain, headache, weakness, blurred vision Disposition: [] ED /[] Urgent Care (no appt availability in office) / [] Appointment(In office/virtual)/ []  [facility]/ [] Home Care/ [x] Refused Recommended Disposition /[] [facility]/ []  Follow-up with PCP Additional Notes: Patient's wife called and is advising that they have noticed swelling in his legs and arms.   Patient's blood pressure today about 15 minutes prior to speaking with this RN was 148/75 and they were concerned with it.  Monday night was 163/76 and patient's wife called their daughter in law. Daughter in law got on the phone and advised that swelling in his right lower leg and is tight feeling x 4 days now. Patient denies fever, nausea, vomiting, diarrhea, difficulty breathing, chest pain, headache, weakness, blurred vision. Patient denies any pain in this right lower leg. Daughter in law advised that both legs are swollen but the right lower leg is swollen more than the left noticeably.   They are advised that the recommendation at this time is for the patient to be seen in the next 4 hours.  Daughter in law states that he probably will not want to do that at all.  She is given Care Advice for the blood pressure concerns and for leg swelling as per protocols.  She is also advised of any worsening, to take him immediately to the emergency room or call 911.  This was emphasized multiple times and daughter in law was understanding of this. She states that she strongly doubts he will go to an urgent care or the ER at this time to have any of this assessed at this  time.   Reason for Disposition  [1] Thigh, calf, or ankle swelling AND [2] bilateral AND [3] 1 side is more swollen  [1] Systolic BP  >= 130 OR Diastolic >= 80 AND [2] not taking BP medications  Answer Assessment - Initial Assessment Questions 1. ONSET: "When did the swelling start?" (e.g., minutes, hours, days)     Possibly 4 days ago 2. LOCATION: "What part of the leg is swollen?"  "Are both legs swollen or just one leg?"     Yes as compared to the left lower leg 3. SEVERITY: "How bad is the swelling?" (e.g., localized; mild, moderate, severe)   - Localized: Small area of swelling localized to one leg.   - MILD pedal edema: Swelling limited to foot and ankle, pitting edema < 1/4 inch (6 mm) deep, rest and elevation eliminate most or all swelling.   - MODERATE edema: Swelling of lower leg to knee, pitting edema > 1/4 inch (6 mm) deep, rest and elevation only partially reduce swelling.   - SEVERE edema: Swelling extends above knee, facial or hand swelling present.      "Swelling below right knee entire right lower leg 4. REDNESS: "Does the swelling look red or infected?"     "Not red and not hot" 5. PAIN: "Is the swelling painful to touch?" If Yes, ask: "How painful is it?"   (Scale 1-10; mild, moderate or severe)     "He hasn't complained about it" 6. FEVER: "Do you have a fever?" If Yes, ask: "What is it, how was it measured, and when did it start?"  No 7. CAUSE: "What do you think is causing the leg swelling?"     unknown 8. MEDICAL HISTORY: "Do you have a history of blood clots (e.g., DVT), cancer, heart failure, kidney disease, or liver failure?"     CHF, prostate cancer,  9. RECURRENT SYMPTOM: "Have you had leg swelling before?" If Yes, ask: "When was the last time?" "What happened that time?"     Yeah but it's usually both 10. OTHER SYMPTOMS: "Do you have any other symptoms?" (e.g., chest pain, difficulty breathing)       Hands swelling and concerns for hypertension  recently  Answer Assessment - Initial Assessment Questions 1. BLOOD PRESSURE: "What is the blood pressure?" "Did you take at least two measurements 5 minutes apart?"     No 2. ONSET: "When did you take your blood pressure?"     Today before triage was 148/75 3. HOW: "How did you take your blood pressure?" (e.g., automatic home BP monitor, visiting nurse)     At home--home health care 4. HISTORY: "Do you have a history of high blood pressure?"     No 5. MEDICINES: "Are you taking any medicines for blood pressure?" "Have you missed any doses recently?"     No 6. OTHER SYMPTOMS: "Do you have any symptoms?" (e.g., blurred vision, chest pain, difficulty breathing, headache, weakness)     No  Protocols used: Leg Swelling and Edema-A-AH, Blood Pressure - High-A-AH

## 2023-11-01 NOTE — Telephone Encounter (Signed)
 Please advise what we should do for this patient. I offered an appointment they declined it.

## 2023-11-10 DIAGNOSIS — Z0001 Encounter for general adult medical examination with abnormal findings: Secondary | ICD-10-CM | POA: Diagnosis not present

## 2023-11-10 DIAGNOSIS — E1121 Type 2 diabetes mellitus with diabetic nephropathy: Secondary | ICD-10-CM | POA: Diagnosis not present

## 2023-11-10 DIAGNOSIS — C61 Malignant neoplasm of prostate: Secondary | ICD-10-CM | POA: Diagnosis not present

## 2023-11-10 DIAGNOSIS — Z136 Encounter for screening for cardiovascular disorders: Secondary | ICD-10-CM | POA: Diagnosis not present

## 2023-11-10 DIAGNOSIS — Z794 Long term (current) use of insulin: Secondary | ICD-10-CM | POA: Diagnosis not present

## 2023-11-15 ENCOUNTER — Other Ambulatory Visit: Payer: Self-pay

## 2023-11-15 ENCOUNTER — Inpatient Hospital Stay: Payer: Medicare Other

## 2023-11-15 ENCOUNTER — Inpatient Hospital Stay: Payer: Medicare Other | Attending: Hematology

## 2023-11-15 ENCOUNTER — Inpatient Hospital Stay (HOSPITAL_BASED_OUTPATIENT_CLINIC_OR_DEPARTMENT_OTHER): Payer: Medicare Other | Admitting: Hematology

## 2023-11-15 ENCOUNTER — Other Ambulatory Visit (HOSPITAL_COMMUNITY): Payer: Self-pay

## 2023-11-15 DIAGNOSIS — Z5111 Encounter for antineoplastic chemotherapy: Secondary | ICD-10-CM | POA: Insufficient documentation

## 2023-11-15 DIAGNOSIS — C7951 Secondary malignant neoplasm of bone: Secondary | ICD-10-CM | POA: Insufficient documentation

## 2023-11-15 DIAGNOSIS — C61 Malignant neoplasm of prostate: Secondary | ICD-10-CM | POA: Diagnosis not present

## 2023-11-15 DIAGNOSIS — Z79899 Other long term (current) drug therapy: Secondary | ICD-10-CM | POA: Insufficient documentation

## 2023-11-15 DIAGNOSIS — D649 Anemia, unspecified: Secondary | ICD-10-CM

## 2023-11-15 LAB — CMP (CANCER CENTER ONLY)
ALT: 11 U/L (ref 0–44)
AST: 12 U/L — ABNORMAL LOW (ref 15–41)
Albumin: 3.1 g/dL — ABNORMAL LOW (ref 3.5–5.0)
Alkaline Phosphatase: 106 U/L (ref 38–126)
Anion gap: 6 (ref 5–15)
BUN: 15 mg/dL (ref 8–23)
CO2: 31 mmol/L (ref 22–32)
Calcium: 8.6 mg/dL — ABNORMAL LOW (ref 8.9–10.3)
Chloride: 103 mmol/L (ref 98–111)
Creatinine: 1.46 mg/dL — ABNORMAL HIGH (ref 0.61–1.24)
GFR, Estimated: 45 mL/min — ABNORMAL LOW (ref >60)
Glucose, Bld: 228 mg/dL — ABNORMAL HIGH (ref 70–99)
Potassium: 3 mmol/L — ABNORMAL LOW (ref 3.5–5.1)
Sodium: 140 mmol/L (ref 135–145)
Total Bilirubin: 0.3 mg/dL (ref 0.0–1.2)
Total Protein: 6 g/dL — ABNORMAL LOW (ref 6.5–8.1)

## 2023-11-15 LAB — CBC WITH DIFFERENTIAL (CANCER CENTER ONLY)
Abs Immature Granulocytes: 0.09 10*3/uL — ABNORMAL HIGH (ref 0.00–0.07)
Basophils Absolute: 0 10*3/uL (ref 0.0–0.1)
Basophils Relative: 0 %
Eosinophils Absolute: 0.3 10*3/uL (ref 0.0–0.5)
Eosinophils Relative: 2 %
HCT: 37.1 % — ABNORMAL LOW (ref 39.0–52.0)
Hemoglobin: 12.3 g/dL — ABNORMAL LOW (ref 13.0–17.0)
Immature Granulocytes: 1 %
Lymphocytes Relative: 14 %
Lymphs Abs: 1.8 10*3/uL (ref 0.7–4.0)
MCH: 28.7 pg (ref 26.0–34.0)
MCHC: 33.2 g/dL (ref 30.0–36.0)
MCV: 86.5 fL (ref 80.0–100.0)
Monocytes Absolute: 1.1 10*3/uL — ABNORMAL HIGH (ref 0.1–1.0)
Monocytes Relative: 8 %
Neutro Abs: 9.7 10*3/uL — ABNORMAL HIGH (ref 1.7–7.7)
Neutrophils Relative %: 75 %
Platelet Count: 385 10*3/uL (ref 150–400)
RBC: 4.29 MIL/uL (ref 4.22–5.81)
RDW: 15.1 % (ref 11.5–15.5)
WBC Count: 13 10*3/uL — ABNORMAL HIGH (ref 4.0–10.5)
nRBC: 0 % (ref 0.0–0.2)

## 2023-11-15 MED ORDER — ENZALUTAMIDE 80 MG PO TABS: 80.0000 mg | ORAL_TABLET | Freq: Every day | ORAL | 11 refills | Status: AC

## 2023-11-15 MED ORDER — LEUPROLIDE ACETATE (3 MONTH) 22.5 MG ~~LOC~~ KIT
22.5000 mg | PACK | Freq: Once | SUBCUTANEOUS | Status: AC
  Administered 2023-11-15: 22.5 mg via SUBCUTANEOUS
  Filled 2023-11-15: qty 22.5

## 2023-11-15 NOTE — Progress Notes (Signed)
 HEMATOLOGY/ONCOLOGY CLINIC NOTE  Date of Service: 11/15/2023  Patient Care Team: Collective, [facility] as PCP - General Corky Crafts, MD as PCP - Cardiology (Cardiology) Szabat, Vinnie Level, [facility] (Inactive) as Pharmacist (Pharmacist) Stephannie Li, MD as Consulting Physician (Ophthalmology) Johney Maine, MD as Consulting Physician (Hematology)  CHIEF COMPLAINTS/PURPOSE OF CONSULTATION:  F/u for continued management of poorly differentiated prostate cancer   HISTORY OF PRESENTING ILLNESS:  Nicholas Burton is a wonderful 89 y.o. male who has been referred to [place] by Dr Syliva Overman MD for evaluation and management of metastatic poorly differentiated prostate cancer with oligometastatic disease to the L3.   He has a history of hypertension, chronic diastolic heart failure, dyslipidemia, diabetes type 2, dementia and was admitted to [facility] with acute encephalopathy 10/05/2022.   Patient had a CT of the head which showed no acute intracranial findings CT of the lumbar spine was done which showed a lytic destructive pattern involving L2-L3 and L4 vertebral bodies concerning for possible discitis versus osteomyelitis versus tumor.   MRI lumbar spine on 10/05/2022 showed Findings concerning for osteomyelitis discitis at the L3-4 level. Abnormal enhancing material within the ventral epidural space at the level of L3, concerning for epidural phlegmon. These changes superimposed on underlying spondylosis and facet arthrosis resultant severe spinal stenosis. While these changes are favored to reflect infection, a possible neoplastic process involving the L3 vertebral body remains difficult to exclude. [place] interval follow-up imaging may be helpful for further evaluation as warranted.  This was treated with empiric antibiotics and is being followed by infectious disease. Patient subsequently underwent a biopsy of L3 10/07/2022 which showed metastatic poorly differentiated  prostate cancer.   Patient subsequently had a CT of the chest abdomen pelvis without contrast at our recommendation which was done on 10/11/2022 and showed no evidence of metastatic disease in the chest abdomen or pelvis.  Diffuse bladder wall thickening with surrounding inflammatory stranding worrisome for cystitis.  Stable abnormal appearance of L3 and nonspecific presacral edema.  INTERVAL HISTORY  Nicholas Burton is a wonderful 89 y.o. male who is here for continued evaluation and management of metastatic poorly differentiated prostate cancer with oligometastatic disease to the L3.   Patient was last seen by me on 08/22/2023 and complained of ear issues, vision issues, and COVID-19 infection and UTI one month prior. He reported that his COVID-19 infection caused some mild breathing issues. Patient also reported having a fall with injury to the left arm and right elbow.   Today, he presents in a wheelchair and is accompanied by his son. His son reports that patient has hearing issues. He does wear hearing aids which mildly improves hearing.   His son reports that patient has not received Xtandi for 1 week. He notes that patient's leg swelling improved since being off of Xtandi for one week. Patient continues to be on Demadex, which could explain loss of fluid retention.   Patient's catheter has been stable and is regularly replaced every few weeks.   He complains of right lower back pain though symptoms are not persistent.   Patient denies any swallowing issues, abdominal pain, fever, or chills.  He was noted to have a previous UTI soon after christmas/early January and was given antibiotics.   Patient generally consumes two large meals daily. He is taking potassium replacement 3x daily.   Patient complains of losing his appetite recently. His son notes that patient does enjoy particular foods over others. Patient has gained  four pounds over the last month.   His son reports that there are  considerations of moving patient into assisted living for continued care.   MEDICAL HISTORY:  Past Medical History:  Diagnosis Date   Allergy    ANEMIA ASSOCIATED W/OTHER [facility] NUTRITIONAL DEFIC 08/11/2010   Qualifier: Diagnosis of  By: Yetta Barre MD, Bernadene Bell.    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    History of skin cancer    Hyperlipidemia with target LDL less than 100 11/10/2008        Hypertension    Memory loss 05/14/2009   Obesity (BMI 30.0-34.9) 04/01/2013   Osteoarthritis    PSA elevation 10/25/2018   SKIN CANCER, HX OF 11/10/2008   Qualifier: Diagnosis of  By: Yetta Barre MD, Bernadene Bell.    Snoring 04/03/2014   Type II diabetes mellitus with manifestations (HCC) 11/10/2008   Estimated Creatinine Clearance: 38 mL/min (A) (by C-G formula based on SCr of 1.52 mg/dL (H)).    Venous stasis dermatitis of both lower extremities 01/29/2018   Vitamin D deficiency 11/10/2008    SURGICAL HISTORY: Past Surgical History:  Procedure Laterality Date   CHOLECYSTECTOMY     INGUINAL HERNIA REPAIR     x 3   IR FLUORO GUIDED NEEDLE PLC ASPIRATION/INJECTION LOC  10/07/2022   JOINT REPLACEMENT     KNEE SURGERY     x 2   MELANOMA EXCISION     x 3 -- Left arm   TOTAL KNEE ARTHROPLASTY     x 3    SOCIAL HISTORY: Social History   Socioeconomic History   Marital status: Married    Spouse name: Aurea Graff   Number of children: 4   Years of education: Not on file   Highest education level: Not on file  Occupational History   Occupation: retired    Associate Professor: RETIRED  Tobacco Use   Smoking status: Never   Smokeless tobacco: Never  Vaping Use   Vaping  status: Never Used  Substance and Sexual Activity   Alcohol use: No   Drug use: No   Sexual activity: Yes    Birth control/protection: None  Other Topics Concern   Not on file  Social History Narrative   No regular exercise   Lives with wife.     Social Drivers of Corporate investment banker Strain: Low Risk  (07/13/2023)   Overall Financial Resource Strain (CARDIA)    Difficulty of Paying Living Expenses: Not hard at all  Food Insecurity: No Food Insecurity (07/19/2023)   Hunger Vital Sign    Worried About Running Out of Food in the Last Year: Never true    Ran Out of Food in the Last Year: Never true  Transportation Needs: No Transportation Needs (07/19/2023)   PRAPARE - Administrator, Civil Service (Medical): No    Lack of Transportation (Non-Medical): No  Physical Activity: Sufficiently Active (07/13/2023)   Exercise Vital Sign    Days of Exercise per Week: 6 days    Minutes of Exercise per Session: 30 min  Stress: Patient Unable  To Answer (07/13/2023)   [employer] of Occupational Health - Occupational Stress Questionnaire    Feeling of Stress : Patient unable to answer  Social Connections: Moderately Isolated (07/13/2023)   Social Connection and Isolation Panel [NHANES]    Frequency of Communication with Friends and Family: Never    Frequency of Social Gatherings with Friends and Family: Never    Attends Religious Services: Never    Database administrator or Organizations: Yes    Attends Banker Meetings: Never    Marital Status: Married  Catering manager Violence: Not At Risk (07/19/2023)   Humiliation, Afraid, Rape, and Kick questionnaire    Fear of Current or Ex-Partner: No    Emotionally Abused: No    Physically Abused: No    Sexually Abused: No    FAMILY HISTORY: Family History  Problem Relation Age of Onset   Heart disease Mother    Diabetes Mother    Heart disease Father    Cancer Sister    Cancer Brother    Diabetes  Son    Diabetes Other     ALLERGIES:  is allergic to ace inhibitors, oxycodone, and penicillins.  MEDICATIONS:  Current Outpatient Medications  Medication Sig Dispense Refill   acetaminophen (TYLENOL) 325 MG tablet Take 1-2 tablets (325-650 mg total) by mouth every 6 (six) hours as needed for mild pain.     cholecalciferol (VITAMIN D) 1000 units tablet Take 1 tablet (1,000 Units total) by mouth daily. (Patient taking differently: Take 1,000 Units by mouth every evening.) 30 tablet 0   Colesevelam HCl 3.75 g PACK Take 1 packet by mouth daily. 60 packet 1   cyanocobalamin (VITAMIN B12) 500 MCG tablet Take 1 tablet (500 mcg total) by mouth daily. 30 tablet 0   guaiFENesin-dextromethorphan (ROBITUSSIN DM) 100-10 MG/5ML syrup Take 5 mLs by mouth every 4 (four) hours as needed for cough.     HYDROcodone bit-homatropine (HYCODAN) 5-1.5 MG/5ML syrup Take 5 mLs by mouth every 8 (eight) hours as needed for cough. 120 mL 0   insulin aspart (NOVOLOG) 100 UNIT/ML injection Inject 0-9 Units into the skin 3 (three) times daily with meals. Sliding scale CBG 70 - 120: 0 units CBG 121 - 150: 1 unit,  CBG 151 - 200: 2 units,  CBG 201 - 250: 3 units,  CBG 251 - 300: 5 units,  CBG 301 - 350: 7 units,  CBG 351 - 400: 9 units   CBG > 400: 9 units and notify your MD     Insulin Pen Needle (NOVOFINE) 30G X 8 MM MISC Inject 10 each into the skin as needed. 100 each 1   LANTUS SOLOSTAR 100 UNIT/ML Solostar Pen Inject 23 Units into the skin at bedtime. 21 mL 0   magnesium gluconate (MAGONATE) 500 MG tablet Take 1 tablet (500 mg total) by mouth at bedtime. 30 tablet 0   melatonin 3 MG TABS tablet Take 1 tablet (3 mg total) by mouth at bedtime as needed. 30 tablet 0   metoprolol tartrate (LOPRESSOR) 25 MG tablet TAKE 1/2 TABLET BY MOUTH TWICE DAILY 90 tablet 1   Multiple Vitamins-Minerals (PRESERVISION/LUTEIN) CAPS Take 1 capsule by mouth 2 (two) times daily.     Potassium Chloride ER 20 MEQ TBCR Take 1 tablet by mouth 3  (three) times daily.     tamsulosin (FLOMAX) 0.4 MG CAPS capsule Take 1 capsule (0.4 mg total) by mouth daily. 90 capsule 1   torsemide (DEMADEX) 10 MG tablet Take  1 tablet (10 mg total) by mouth every other day. 45 tablet 2   Vibegron (GEMTESA) 75 MG TABS TAKE 1 TABLET DAILY 90 tablet 0   XTANDI 80 MG tablet TAKE 1 TABLET DAILY 30 tablet 1   No current facility-administered medications for this visit.    REVIEW OF SYSTEMS:    10 Point review of Systems was done is negative except as noted above.   PHYSICAL EXAMINATION: ECOG PERFORMANCE STATUS: 2 - Symptomatic, <50% confined to bed  . Vitals:   11/15/23 1425  BP: 109/65  Pulse: 80  Resp: 18  Temp: 98.1 F (36.7 C)  SpO2: 98%   Filed Weights   11/15/23 1425  Weight: 164 lb 3.2 oz (74.5 kg)  .Body mass index is 24.97 kg/m.  GENERAL:alert, in no acute distress and comfortable SKIN: no acute rashes, no significant lesions EYES: conjunctiva are pink and non-injected, sclera anicteric OROPHARYNX: MMM, no exudates, no oropharyngeal erythema or ulceration NECK: supple, no JVD LYMPH:  no palpable lymphadenopathy in the cervical, axillary or inguinal regions LUNGS: clear to auscultation b/l with normal respiratory effort HEART: regular rate & rhythm ABDOMEN:  normoactive bowel sounds , non tender, not distended. Extremity: no pedal edema PSYCH: alert & oriented x 3 with fluent speech NEURO: no focal motor/sensory deficits   LABORATORY DATA:  I have reviewed the data as listed .    Latest Ref Rng & Units 11/15/2023    1:58 PM 08/22/2023   11:25 AM 07/19/2023    6:33 AM  CBC  WBC 4.0 - 10.5 K/uL 13.0  12.2  11.6   Hemoglobin 13.0 - 17.0 g/dL 09.8  11.9  14.7   Hematocrit 39.0 - 52.0 % 37.1  41.5  38.7   Platelets 150 - 400 K/uL 385  299  190    .    Latest Ref Rng & Units 11/15/2023    1:58 PM 08/22/2023   11:25 AM 07/19/2023    6:33 AM  CMP  Glucose 70 - 99 mg/dL 829  562  130   BUN 8 - 23 mg/dL 15  21  13     Creatinine 0.61 - 1.24 mg/dL 8.65  7.84  6.96   Sodium 135 - 145 mmol/L 140  141  140   Potassium 3.5 - 5.1 mmol/L 3.0  5.2  3.4   Chloride 98 - 111 mmol/L 103  108  103   CO2 22 - 32 mmol/L 31  27  27    Calcium 8.9 - 10.3 mg/dL 8.6  8.8  8.5   Total Protein 6.5 - 8.1 g/dL 6.0  6.0    Total Bilirubin 0.0 - 1.2 mg/dL 0.3  0.4    Alkaline Phos 38 - 126 U/L 106  120    AST 15 - 41 U/L 12  13    ALT 0 - 44 U/L 11  9         RADIOGRAPHIC STUDIES: I have personally reviewed the radiological images as listed and agreed with the findings in the report. No results found.   ASSESSMENT & PLAN:   89 year old male with no previous history of malignancy.  Previous history of hypertension, dyslipidemia, diabetes, CHF dementia though alert oriented x 3 at baseline admitted with altered mental status which is now more or less cleared significant new back pain.   #1 Pathologic L3 compression due to bone metastases from metastatic poorly differentiated prostate cancer.  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  Cannot rule out additional infection.  No cultures were sent from his L3 biopsy. PSA levels are elevated at 55.1 CT chest abdomen pelvis did not show any other sites of overt metastatic disease at this time or overt prostate lesions.   #2 history of dementia though apparently alert oriented x 3 at baseline with poor cognition.   #3 hypertension, dyslipidemia, diabetes, CHF.  PLAN:  -Discussed lab results on 11/15/23 in detail with patient. CBC showed WBC of 13.0K, hemoglobin of 12.3, and platelets of 385K. -there is some chronic leukocytosis which could be from inflammation form catheter -last PSA was stable at 1.7 ng/mL -PSA testing today is pending at time of clinical visit -- results available later shows stable PSA od 1.5  -CMP shows low potassium at 3 mmol/L and elevated blood sugar at 228 mg/dL. CMP is otherwise  stable -discussed that if there is no concern for significant leg swelling, there may be a role to change Torsemide dose to every other day and double his potassium-replacement dose to 40 MEQ for 3-5 days.  -will send in Hominy -patient is agreeable to continue Xtand 80mg  po daily with Leuprolide shots every 3 months.    FOLLOW-UP: Plz schedule next 2 doses of Lupron every 3 months per orders RTC with Dr Candise Che with labs with next dose of lupron in 3 months  The total time spent in the appointment was 30 minutes* .  All of the patient's questions were answered with apparent satisfaction. The patient knows to call the clinic with any problems, questions or concerns.   Wyvonnia Lora MD MS AAHIVMS [facility] Hematology/Oncology Physician [facility]  .*Total Encounter Time as defined by the Centers for Medicare and Medicaid Services includes, in addition to the face-to-face time of a patient visit (documented in the note above) non-face-to-face time: obtaining and reviewing outside history, ordering and reviewing medications, tests or procedures, care coordination (communications with other health care professionals or caregivers) and documentation in the medical record.    I,Mitra Faeizi,acting as a Neurosurgeon for Wyvonnia Lora, MD.,have documented all relevant documentation on the behalf of Wyvonnia Lora, MD,as directed by  Wyvonnia Lora, MD while in the presence of Wyvonnia Lora, MD.  .I have reviewed the above documentation for accuracy and completeness, and I agree with the above. Johney Maine MD

## 2023-11-17 LAB — PROSTATE-SPECIFIC AG, SERUM (LABCORP): Prostate Specific Ag, Serum: 1.5 ng/mL (ref 0.0–4.0)

## 2023-11-21 ENCOUNTER — Encounter: Payer: Self-pay | Admitting: Hematology

## 2023-11-22 ENCOUNTER — Other Ambulatory Visit: Payer: Self-pay | Admitting: Internal Medicine

## 2023-11-22 DIAGNOSIS — C61 Malignant neoplasm of prostate: Secondary | ICD-10-CM

## 2023-11-22 DIAGNOSIS — C7951 Secondary malignant neoplasm of bone: Secondary | ICD-10-CM

## 2023-11-24 ENCOUNTER — Telehealth: Payer: Self-pay

## 2023-11-24 DIAGNOSIS — R338 Other retention of urine: Secondary | ICD-10-CM | POA: Diagnosis not present

## 2023-11-24 NOTE — Telephone Encounter (Signed)
 Copied from CRM [id number]. Topic: General - Other >> Nov 24, 2023  1:09 PM Fredrich Romans wrote: Reason for CRM: Patients son Izzac Rockett called in to check on the [facility] application that was suppose to be sent in to assisted living facility [facility]. He stated that they facility said that they still have not received the form back.

## 2023-11-24 NOTE — Telephone Encounter (Signed)
FL2 form has been faxed.  

## 2023-11-27 NOTE — Telephone Encounter (Signed)
 Copied from CRM [id number]. Topic: General - Other >> Nov 27, 2023 12:18 PM Adrionna Y wrote: Reason for CRM: jessica from [facility] in [place] is calling because she needs another fl2 form. The one she has is dated feb 21 and it has to be used within 30 days. the patient is coming after the 30 day [place]. She just needds another one with an updated date and to make sure all prescriptions are routed

## 2023-11-28 DIAGNOSIS — G301 Alzheimer's disease with late onset: Secondary | ICD-10-CM | POA: Diagnosis not present

## 2023-11-30 DIAGNOSIS — R799 Abnormal finding of blood chemistry, unspecified: Secondary | ICD-10-CM | POA: Diagnosis not present

## 2023-11-30 DIAGNOSIS — C61 Malignant neoplasm of prostate: Secondary | ICD-10-CM | POA: Diagnosis not present

## 2023-11-30 DIAGNOSIS — I10 Essential (primary) hypertension: Secondary | ICD-10-CM | POA: Diagnosis not present

## 2023-11-30 DIAGNOSIS — E1121 Type 2 diabetes mellitus with diabetic nephropathy: Secondary | ICD-10-CM | POA: Diagnosis not present

## 2023-11-30 DIAGNOSIS — N1831 Chronic kidney disease, stage 3a: Secondary | ICD-10-CM | POA: Diagnosis not present

## 2023-11-30 DIAGNOSIS — E785 Hyperlipidemia, unspecified: Secondary | ICD-10-CM | POA: Diagnosis not present

## 2023-11-30 NOTE — Telephone Encounter (Signed)
 New forms has been faxed with todays date.

## 2023-12-01 ENCOUNTER — Ambulatory Visit: Payer: Medicare Other

## 2023-12-05 DIAGNOSIS — N400 Enlarged prostate without lower urinary tract symptoms: Secondary | ICD-10-CM | POA: Diagnosis not present

## 2023-12-05 DIAGNOSIS — Z794 Long term (current) use of insulin: Secondary | ICD-10-CM | POA: Diagnosis not present

## 2023-12-05 DIAGNOSIS — I509 Heart failure, unspecified: Secondary | ICD-10-CM | POA: Diagnosis not present

## 2023-12-05 DIAGNOSIS — C61 Malignant neoplasm of prostate: Secondary | ICD-10-CM | POA: Diagnosis not present

## 2023-12-06 ENCOUNTER — Other Ambulatory Visit: Payer: Self-pay | Admitting: Internal Medicine

## 2023-12-06 DIAGNOSIS — N3281 Overactive bladder: Secondary | ICD-10-CM

## 2023-12-06 MED ORDER — GEMTESA 75 MG PO TABS: 1.0000 | ORAL_TABLET | Freq: Every day | ORAL | 0 refills | Status: AC

## 2023-12-06 NOTE — Telephone Encounter (Signed)
 Copied from CRM [id number]. Topic: Clinical - Medication Refill >> Dec 06, 2023  4:09 PM Ann Held wrote: Most Recent Primary Care Visit:  Provider: Theressa Stamps  Department: [facility] [place]  Visit Type: NURSE VISIT  Date: 10/27/2023  Medication: Vibegron (GEMTESA) 75 MG TABS  Has the patient contacted their pharmacy? Yes (Agent: If no, request that the patient contact the pharmacy for the refill. If patient does not wish to contact the pharmacy document the reason why and proceed with request.) (Agent: If yes, when and what did the pharmacy advise?)  Is this the correct pharmacy for this prescription? Yes If no, delete pharmacy and type the correct one.  This is the patient's preferred pharmacy:  [facility] STORE #[id number] [place], [place] - [address] AT [facility] [address] [place] [address] Phone: [phone] Fax: [phone]  [facility] - [place], [place] - [address]. [address] [place] [employer]. [address] [place] [address] Phone: [phone] Fax: [phone]  [id number] [place], [place] - [address] [place] [address] Phone: [phone] Fax: [phone]  [facility] - [place], [place] - [address] [place] [address] Phone: [phone] Fax: [phone]  [place], [place] - [address] [facility] [address] [place] [address] Phone: [phone] Fax: [phone]   Has the prescription been filled recently? No  Is the patient out of the medication? Yes  Has the patient been seen for an appointment in the last year OR does the patient have an upcoming appointment? Yes  Can we respond through MyChart? No  Agent: Please be advised that Rx refills may take up to 3 business days. We ask that you follow-up with your pharmacy.

## 2023-12-14 ENCOUNTER — Other Ambulatory Visit: Payer: Self-pay | Admitting: Internal Medicine

## 2023-12-14 DIAGNOSIS — E119 Type 2 diabetes mellitus without complications: Secondary | ICD-10-CM

## 2023-12-22 ENCOUNTER — Other Ambulatory Visit (HOSPITAL_COMMUNITY): Payer: Self-pay

## 2023-12-22 ENCOUNTER — Telehealth: Payer: Self-pay

## 2023-12-22 NOTE — Telephone Encounter (Signed)
 Copied from CRM [id number]. Topic: Clinical - Prescription Issue >> Dec 22, 2023 11:29 AM Nicholas Burton L wrote: Reason for CRM: patient needs a prior auth for medication LANTUS  SOLOSTAR 100 UNIT/ML Solostar Pen.  Sent to [id number] [place], [place] - [address] AT [facility] [address] [place] [address] Phone: [phone] Fax: [phone]

## 2023-12-22 NOTE — Telephone Encounter (Signed)
 Per test claim- ins will cover 30 days at a time. No PA needed.

## 2023-12-26 ENCOUNTER — Other Ambulatory Visit: Payer: Self-pay | Admitting: Internal Medicine

## 2023-12-26 DIAGNOSIS — E119 Type 2 diabetes mellitus without complications: Secondary | ICD-10-CM

## 2023-12-26 NOTE — Telephone Encounter (Signed)
 Copied from CRM [id number]. Topic: Clinical - Medication Refill >> Dec 26, 2023 10:57 AM Marlan Silva wrote: Most Recent Primary Care Visit:  Provider: Maxine Spare  Department: [facility] [place]  Visit Type: NURSE VISIT  Date: 10/27/2023  Medication: LANTUS  SOLOSTAR 100 UNIT/ML Solostar Pen  Has the patient contacted their pharmacy? Yes (Agent: If no, request that the patient contact the pharmacy for the refill. If patient does not wish to contact the pharmacy document the reason why and proceed with request.) (Agent: If yes, when and what did the pharmacy advise?)  Is this the correct pharmacy for this prescription? Yes If no, delete pharmacy and type the correct one.  This is the patient's preferred pharmacy:  [facility] STORE #[id number] [place], [place] - [address] AT [facility] [address] [place] [address] Phone: [phone] Fax: [phone]  [facility] - [place], [place] - [address] [facility]. [address] [place] [employer]. [address] [place] [address] Phone: [phone] Fax: [phone]  [id number] [place], [place] - [address] [place] [address] Phone: [phone] Fax: [phone]  [facility] - [place], [place] - [address] [place] [address] Phone: [phone] Fax: [phone]  [place], [place] - [address] [facility] [address] [place] [address] Phone: [phone] Fax: [phone]   Has the prescription been filled recently? Yes  Is the patient out of the medication? Yes  Has the patient been seen for an appointment in the last year OR does the patient have an upcoming appointment? No  Can we respond through MyChart? Yes  Agent: Please be advised that Rx refills may take up to 3 business days. We ask that you follow-up with your pharmacy.

## 2023-12-27 ENCOUNTER — Encounter (INDEPENDENT_AMBULATORY_CARE_PROVIDER_SITE_OTHER): Payer: Self-pay

## 2023-12-27 ENCOUNTER — Ambulatory Visit (INDEPENDENT_AMBULATORY_CARE_PROVIDER_SITE_OTHER): Payer: Medicare Other | Admitting: Otolaryngology

## 2023-12-27 VITALS — Ht 68.0 in | Wt 165.0 lb

## 2023-12-27 DIAGNOSIS — H903 Sensorineural hearing loss, bilateral: Secondary | ICD-10-CM | POA: Diagnosis not present

## 2023-12-27 DIAGNOSIS — H60331 Swimmer's ear, right ear: Secondary | ICD-10-CM

## 2023-12-27 DIAGNOSIS — R338 Other retention of urine: Secondary | ICD-10-CM | POA: Diagnosis not present

## 2023-12-28 DIAGNOSIS — G301 Alzheimer's disease with late onset: Secondary | ICD-10-CM | POA: Diagnosis not present

## 2023-12-28 NOTE — Progress Notes (Signed)
 Patient ID: Nicholas Burton, male   DOB: [birth year], 89 y.o.   MRN: [id number]  Follow-up: Hearing loss  HPI: The patient is a 89 year old male who returns today for his follow-up evaluation.  The patient was last seen in February 2025.  At that time, he was complaining of hearing loss and right ear infection.  He was noted to have bilateral sensorineural hearing loss.  He wears bilateral hearing aids.  No significant infection was noted at that time.  The patient returns today reporting persistent hearing difficulty.  However, he has not noted any recent change in his hearing.  He has not experienced any recurrent otitis media or otitis externa.  Exam: General: Communicates without difficulty, well nourished, no acute distress. Head: Normocephalic, no evidence injury, no tenderness, facial buttresses intact without stepoff. Face/sinus: No tenderness to palpation and percussion. Facial movement is normal and symmetric. Eyes: PERRL, EOMI. No scleral icterus, conjunctivae clear. Neuro: CN II exam reveals vision grossly intact.  No nystagmus at any point of gaze. Ears: Auricles well formed without lesions.  Ear canals are intact without mass or lesion.  No erythema or edema is appreciated.  The TMs are intact without fluid. Nose: External evaluation reveals normal support and skin without lesions.  Dorsum is intact.  Anterior rhinoscopy reveals normal mucosa over anterior aspect of inferior turbinates and intact septum.  No purulence noted. Oral:  Oral cavity and oropharynx are intact, symmetric, without erythema or edema.  Mucosa is moist without lesions. Neck: Full range of motion without pain.  There is no significant lymphadenopathy.  No masses palpable.  Thyroid  bed within normal limits to palpation.  Parotid glands and submandibular glands equal bilaterally without mass.  Trachea is midline. Neuro:  CN 2-12 grossly intact.   Assessment: 1.  The patient's ear canals, tympanic membranes, and middle ear spaces  are normal.  No recurrent otitis externa is noted. 2.  Subjectively stable bilateral sensorineural hearing loss.  Plan: 1.  The physical exam findings are reviewed with the patient. 2.  Continue the use of his hearing aids. 3.  The patient is encouraged to call with any questions or concerns.

## 2024-01-01 DIAGNOSIS — J449 Chronic obstructive pulmonary disease, unspecified: Secondary | ICD-10-CM | POA: Diagnosis not present

## 2024-01-01 DIAGNOSIS — N1831 Chronic kidney disease, stage 3a: Secondary | ICD-10-CM | POA: Diagnosis not present

## 2024-01-01 DIAGNOSIS — C61 Malignant neoplasm of prostate: Secondary | ICD-10-CM | POA: Diagnosis not present

## 2024-01-01 DIAGNOSIS — E1122 Type 2 diabetes mellitus with diabetic chronic kidney disease: Secondary | ICD-10-CM | POA: Diagnosis not present

## 2024-01-01 DIAGNOSIS — N400 Enlarged prostate without lower urinary tract symptoms: Secondary | ICD-10-CM | POA: Diagnosis not present

## 2024-01-01 DIAGNOSIS — I5020 Unspecified systolic (congestive) heart failure: Secondary | ICD-10-CM | POA: Diagnosis not present

## 2024-01-01 DIAGNOSIS — I130 Hypertensive heart and chronic kidney disease with heart failure and stage 1 through stage 4 chronic kidney disease, or unspecified chronic kidney disease: Secondary | ICD-10-CM | POA: Diagnosis not present

## 2024-01-03 ENCOUNTER — Inpatient Hospital Stay: Payer: Medicare Other | Admitting: Hematology

## 2024-01-03 ENCOUNTER — Inpatient Hospital Stay: Payer: Medicare Other

## 2024-01-14 ENCOUNTER — Emergency Department (HOSPITAL_COMMUNITY)

## 2024-01-14 ENCOUNTER — Inpatient Hospital Stay (HOSPITAL_COMMUNITY)
Admission: EM | Admit: 2024-01-14 | Discharge: 2024-01-18 | DRG: 698 | Disposition: A | Discharge status: Home Health | Source: Skilled Nursing Facility | Attending: Internal Medicine | Admitting: Internal Medicine

## 2024-01-14 ENCOUNTER — Encounter (HOSPITAL_COMMUNITY): Payer: Self-pay | Admitting: Internal Medicine

## 2024-01-14 ENCOUNTER — Other Ambulatory Visit: Payer: Self-pay

## 2024-01-14 DIAGNOSIS — E538 Deficiency of other specified B group vitamins: Secondary | ICD-10-CM | POA: Diagnosis present

## 2024-01-14 DIAGNOSIS — N3090 Cystitis, unspecified without hematuria: Secondary | ICD-10-CM

## 2024-01-14 DIAGNOSIS — N3281 Overactive bladder: Secondary | ICD-10-CM | POA: Diagnosis not present

## 2024-01-14 DIAGNOSIS — Z833 Family history of diabetes mellitus: Secondary | ICD-10-CM | POA: Diagnosis not present

## 2024-01-14 DIAGNOSIS — Z66 Do not resuscitate: Secondary | ICD-10-CM | POA: Diagnosis present

## 2024-01-14 DIAGNOSIS — N183 Chronic kidney disease, stage 3 unspecified: Secondary | ICD-10-CM | POA: Diagnosis present

## 2024-01-14 DIAGNOSIS — Z794 Long term (current) use of insulin: Secondary | ICD-10-CM | POA: Diagnosis not present

## 2024-01-14 DIAGNOSIS — G301 Alzheimer's disease with late onset: Secondary | ICD-10-CM | POA: Diagnosis present

## 2024-01-14 DIAGNOSIS — F0280 Dementia in other diseases classified elsewhere without behavioral disturbance: Secondary | ICD-10-CM | POA: Diagnosis not present

## 2024-01-14 DIAGNOSIS — C7951 Secondary malignant neoplasm of bone: Secondary | ICD-10-CM | POA: Diagnosis present

## 2024-01-14 DIAGNOSIS — Z85820 Personal history of malignant melanoma of skin: Secondary | ICD-10-CM

## 2024-01-14 DIAGNOSIS — I10 Essential (primary) hypertension: Secondary | ICD-10-CM | POA: Diagnosis present

## 2024-01-14 DIAGNOSIS — Z880 Allergy status to penicillin: Secondary | ICD-10-CM

## 2024-01-14 DIAGNOSIS — E785 Hyperlipidemia, unspecified: Secondary | ICD-10-CM | POA: Diagnosis not present

## 2024-01-14 DIAGNOSIS — R4182 Altered mental status, unspecified: Secondary | ICD-10-CM | POA: Diagnosis not present

## 2024-01-14 DIAGNOSIS — C61 Malignant neoplasm of prostate: Secondary | ICD-10-CM | POA: Diagnosis present

## 2024-01-14 DIAGNOSIS — Z79899 Other long term (current) drug therapy: Secondary | ICD-10-CM

## 2024-01-14 DIAGNOSIS — G928 Other toxic encephalopathy: Secondary | ICD-10-CM | POA: Diagnosis present

## 2024-01-14 DIAGNOSIS — E875 Hyperkalemia: Secondary | ICD-10-CM | POA: Diagnosis not present

## 2024-01-14 DIAGNOSIS — B9620 Unspecified Escherichia coli [E. coli] as the cause of diseases classified elsewhere: Secondary | ICD-10-CM | POA: Diagnosis present

## 2024-01-14 DIAGNOSIS — N3000 Acute cystitis without hematuria: Secondary | ICD-10-CM | POA: Diagnosis not present

## 2024-01-14 DIAGNOSIS — R739 Hyperglycemia, unspecified: Secondary | ICD-10-CM | POA: Diagnosis not present

## 2024-01-14 DIAGNOSIS — Z7401 Bed confinement status: Secondary | ICD-10-CM | POA: Diagnosis not present

## 2024-01-14 DIAGNOSIS — G9340 Encephalopathy, unspecified: Secondary | ICD-10-CM | POA: Diagnosis present

## 2024-01-14 DIAGNOSIS — T83511A Infection and inflammatory reaction due to indwelling urethral catheter, initial encounter: Secondary | ICD-10-CM | POA: Diagnosis not present

## 2024-01-14 DIAGNOSIS — J4489 Other specified chronic obstructive pulmonary disease: Secondary | ICD-10-CM | POA: Diagnosis not present

## 2024-01-14 DIAGNOSIS — Z85828 Personal history of other malignant neoplasm of skin: Secondary | ICD-10-CM | POA: Diagnosis not present

## 2024-01-14 DIAGNOSIS — I130 Hypertensive heart and chronic kidney disease with heart failure and stage 1 through stage 4 chronic kidney disease, or unspecified chronic kidney disease: Secondary | ICD-10-CM | POA: Diagnosis present

## 2024-01-14 DIAGNOSIS — R404 Transient alteration of awareness: Principal | ICD-10-CM

## 2024-01-14 DIAGNOSIS — E1122 Type 2 diabetes mellitus with diabetic chronic kidney disease: Secondary | ICD-10-CM | POA: Diagnosis present

## 2024-01-14 DIAGNOSIS — Z96659 Presence of unspecified artificial knee joint: Secondary | ICD-10-CM | POA: Diagnosis present

## 2024-01-14 DIAGNOSIS — I6782 Cerebral ischemia: Secondary | ICD-10-CM | POA: Diagnosis not present

## 2024-01-14 DIAGNOSIS — Z8249 Family history of ischemic heart disease and other diseases of the circulatory system: Secondary | ICD-10-CM | POA: Diagnosis not present

## 2024-01-14 DIAGNOSIS — I672 Cerebral atherosclerosis: Secondary | ICD-10-CM | POA: Diagnosis not present

## 2024-01-14 DIAGNOSIS — Z888 Allergy status to other drugs, medicaments and biological substances status: Secondary | ICD-10-CM

## 2024-01-14 DIAGNOSIS — N138 Other obstructive and reflux uropathy: Secondary | ICD-10-CM | POA: Diagnosis present

## 2024-01-14 DIAGNOSIS — Y846 Urinary catheterization as the cause of abnormal reaction of the patient, or of later complication, without mention of misadventure at the time of the procedure: Secondary | ICD-10-CM | POA: Diagnosis present

## 2024-01-14 DIAGNOSIS — J320 Chronic maxillary sinusitis: Secondary | ICD-10-CM | POA: Diagnosis not present

## 2024-01-14 DIAGNOSIS — Z885 Allergy status to narcotic agent status: Secondary | ICD-10-CM

## 2024-01-14 DIAGNOSIS — R410 Disorientation, unspecified: Secondary | ICD-10-CM | POA: Diagnosis not present

## 2024-01-14 DIAGNOSIS — Z809 Family history of malignant neoplasm, unspecified: Secondary | ICD-10-CM

## 2024-01-14 DIAGNOSIS — R918 Other nonspecific abnormal finding of lung field: Secondary | ICD-10-CM | POA: Diagnosis not present

## 2024-01-14 DIAGNOSIS — N401 Enlarged prostate with lower urinary tract symptoms: Secondary | ICD-10-CM | POA: Diagnosis present

## 2024-01-14 DIAGNOSIS — N1831 Chronic kidney disease, stage 3a: Secondary | ICD-10-CM | POA: Diagnosis present

## 2024-01-14 DIAGNOSIS — N400 Enlarged prostate without lower urinary tract symptoms: Secondary | ICD-10-CM | POA: Diagnosis present

## 2024-01-14 DIAGNOSIS — E119 Type 2 diabetes mellitus without complications: Secondary | ICD-10-CM

## 2024-01-14 DIAGNOSIS — I5032 Chronic diastolic (congestive) heart failure: Secondary | ICD-10-CM | POA: Diagnosis not present

## 2024-01-14 DIAGNOSIS — I5042 Chronic combined systolic (congestive) and diastolic (congestive) heart failure: Secondary | ICD-10-CM | POA: Diagnosis present

## 2024-01-14 LAB — CBC WITH DIFFERENTIAL/PLATELET
Abs Immature Granulocytes: 0.04 10*3/uL (ref 0.00–0.07)
Basophils Absolute: 0 10*3/uL (ref 0.0–0.1)
Basophils Relative: 0 %
Eosinophils Absolute: 0.3 10*3/uL (ref 0.0–0.5)
Eosinophils Relative: 3 %
HCT: 37.4 % — ABNORMAL LOW (ref 39.0–52.0)
Hemoglobin: 11.7 g/dL — ABNORMAL LOW (ref 13.0–17.0)
Immature Granulocytes: 0 %
Lymphocytes Relative: 24 %
Lymphs Abs: 2.2 10*3/uL (ref 0.7–4.0)
MCH: 28.8 pg (ref 26.0–34.0)
MCHC: 31.3 g/dL (ref 30.0–36.0)
MCV: 92.1 fL (ref 80.0–100.0)
Monocytes Absolute: 0.9 10*3/uL (ref 0.1–1.0)
Monocytes Relative: 9 %
Neutro Abs: 5.9 10*3/uL (ref 1.7–7.7)
Neutrophils Relative %: 64 %
Platelets: 189 10*3/uL (ref 150–400)
RBC: 4.06 MIL/uL — ABNORMAL LOW (ref 4.22–5.81)
RDW: 15.7 % — ABNORMAL HIGH (ref 11.5–15.5)
WBC: 9.3 10*3/uL (ref 4.0–10.5)
nRBC: 0 % (ref 0.0–0.2)

## 2024-01-14 LAB — HEMOGLOBIN A1C
Hgb A1c MFr Bld: 6.9 % — ABNORMAL HIGH (ref 4.8–5.6)
Mean Plasma Glucose: 151.33 mg/dL

## 2024-01-14 LAB — URINALYSIS, W/ REFLEX TO CULTURE (INFECTION SUSPECTED)
Bilirubin Urine: NEGATIVE
Glucose, UA: >=500 mg/dL — AB
Ketones, ur: NEGATIVE mg/dL
Nitrite: POSITIVE — AB
Protein, ur: 30 mg/dL — AB
Specific Gravity, Urine: 1.008 (ref 1.005–1.030)
WBC, UA: >50 WBC/hpf (ref 0–5)
pH: 5 (ref 5.0–8.0)

## 2024-01-14 LAB — CBC
HCT: 38 % — ABNORMAL LOW (ref 39.0–52.0)
Hemoglobin: 12 g/dL — ABNORMAL LOW (ref 13.0–17.0)
MCH: 28.9 pg (ref 26.0–34.0)
MCHC: 31.6 g/dL (ref 30.0–36.0)
MCV: 91.6 fL (ref 80.0–100.0)
Platelets: 194 10*3/uL (ref 150–400)
RBC: 4.15 MIL/uL — ABNORMAL LOW (ref 4.22–5.81)
RDW: 15.5 % (ref 11.5–15.5)
WBC: 8.3 10*3/uL (ref 4.0–10.5)
nRBC: 0 % (ref 0.0–0.2)

## 2024-01-14 LAB — BASIC METABOLIC PANEL WITH GFR
Anion gap: 9 (ref 5–15)
BUN: 18 mg/dL (ref 8–23)
CO2: 22 mmol/L (ref 22–32)
Calcium: 8.7 mg/dL — ABNORMAL LOW (ref 8.9–10.3)
Chloride: 107 mmol/L (ref 98–111)
Creatinine, Ser: 1.36 mg/dL — ABNORMAL HIGH (ref 0.61–1.24)
GFR, Estimated: 48 mL/min — ABNORMAL LOW (ref >60)
Glucose, Bld: 183 mg/dL — ABNORMAL HIGH (ref 70–99)
Potassium: 4.5 mmol/L (ref 3.5–5.1)
Sodium: 138 mmol/L (ref 135–145)

## 2024-01-14 LAB — RAPID URINE DRUG SCREEN, HOSP PERFORMED
Amphetamines: NOT DETECTED
Barbiturates: NOT DETECTED
Benzodiazepines: NOT DETECTED
Cocaine: NOT DETECTED
Opiates: NOT DETECTED
Tetrahydrocannabinol: NOT DETECTED

## 2024-01-14 LAB — COMPREHENSIVE METABOLIC PANEL WITH GFR
ALT: 14 U/L (ref 0–44)
AST: 16 U/L (ref 15–41)
Albumin: 2.8 g/dL — ABNORMAL LOW (ref 3.5–5.0)
Alkaline Phosphatase: 116 U/L (ref 38–126)
Anion gap: 8 (ref 5–15)
BUN: 19 mg/dL (ref 8–23)
CO2: 22 mmol/L (ref 22–32)
Calcium: 8.9 mg/dL (ref 8.9–10.3)
Chloride: 108 mmol/L (ref 98–111)
Creatinine, Ser: 1.45 mg/dL — ABNORMAL HIGH (ref 0.61–1.24)
GFR, Estimated: 45 mL/min — ABNORMAL LOW (ref >60)
Glucose, Bld: 264 mg/dL — ABNORMAL HIGH (ref 70–99)
Potassium: 5.3 mmol/L — ABNORMAL HIGH (ref 3.5–5.1)
Sodium: 138 mmol/L (ref 135–145)
Total Bilirubin: 0.5 mg/dL (ref 0.0–1.2)
Total Protein: 5.3 g/dL — ABNORMAL LOW (ref 6.5–8.1)

## 2024-01-14 LAB — GLUCOSE, CAPILLARY: Glucose-Capillary: 201 mg/dL — ABNORMAL HIGH (ref 70–99)

## 2024-01-14 LAB — AMMONIA: Ammonia: 16 umol/L (ref 9–35)

## 2024-01-14 LAB — TSH: TSH: 6.238 u[IU]/mL — ABNORMAL HIGH (ref 0.350–4.500)

## 2024-01-14 MED ORDER — HEPARIN SODIUM (PORCINE) 5000 UNIT/ML IJ SOLN
5000.0000 [IU] | Freq: Three times a day (TID) | INTRAMUSCULAR | Status: DC
  Administered 2024-01-15 (×2): 5000 [IU] via SUBCUTANEOUS
  Filled 2024-01-14: qty 1

## 2024-01-14 MED ORDER — COLESEVELAM HCL 3.75 G PO PACK: 1.0000 | PACK | Freq: Every day | ORAL | Status: DC

## 2024-01-14 MED ORDER — SODIUM CHLORIDE 0.9 % IV SOLN
1.0000 g | Freq: Once | INTRAVENOUS | Status: AC
  Administered 2024-01-14: 1 g via INTRAVENOUS
  Filled 2024-01-14: qty 10

## 2024-01-14 MED ORDER — PROSIGHT PO TABS
1.0000 | ORAL_TABLET | Freq: Two times a day (BID) | ORAL | Status: DC
Start: 2024-01-14 — End: 2024-01-18
  Administered 2024-01-14 – 2024-01-18 (×8): 1 via ORAL
  Filled 2024-01-14 (×9): qty 1

## 2024-01-14 MED ORDER — INSULIN ASPART 100 UNIT/ML IJ SOLN
0.0000 [IU] | Freq: Three times a day (TID) | INTRAMUSCULAR | Status: DC
  Administered 2024-01-15: 2 [IU] via SUBCUTANEOUS
  Administered 2024-01-15: 3 [IU] via SUBCUTANEOUS
  Administered 2024-01-16 (×2): 2 [IU] via SUBCUTANEOUS
  Administered 2024-01-18: 5 [IU] via SUBCUTANEOUS

## 2024-01-14 MED ORDER — ENZALUTAMIDE 80 MG PO TABS: 80.0000 mg | ORAL_TABLET | Freq: Every day | ORAL | Status: DC

## 2024-01-14 MED ORDER — TAMSULOSIN HCL 0.4 MG PO CAPS
0.4000 mg | ORAL_CAPSULE | Freq: Every day | ORAL | Status: DC
Start: 2024-01-15 — End: 2024-01-18
  Administered 2024-01-15 – 2024-01-18 (×4): 0.4 mg via ORAL
  Filled 2024-01-14 (×4): qty 1

## 2024-01-14 MED ORDER — MIRABEGRON ER 25 MG PO TB24
25.0000 mg | ORAL_TABLET | Freq: Every day | ORAL | Status: DC
  Administered 2024-01-15 – 2024-01-18 (×4): 25 mg via ORAL
  Filled 2024-01-14 (×4): qty 1

## 2024-01-14 MED ORDER — VITAMIN B-12 1000 MCG PO TABS
500.0000 ug | ORAL_TABLET | Freq: Every day | ORAL | Status: DC
  Administered 2024-01-15 – 2024-01-18 (×4): 500 ug via ORAL
  Filled 2024-01-14 (×4): qty 1

## 2024-01-14 MED ORDER — COLESEVELAM HCL 625 MG PO TABS
1875.0000 mg | ORAL_TABLET | Freq: Two times a day (BID) | ORAL | Status: DC
Start: 2024-01-15 — End: 2024-01-18
  Administered 2024-01-15 – 2024-01-18 (×7): 1875 mg via ORAL
  Filled 2024-01-14 (×8): qty 3

## 2024-01-14 MED ORDER — INSULIN GLARGINE-YFGN 100 UNIT/ML ~~LOC~~ SOLN
23.0000 [IU] | Freq: Every day | SUBCUTANEOUS | Status: DC
  Administered 2024-01-15 – 2024-01-16 (×3): 23 [IU] via SUBCUTANEOUS
  Filled 2024-01-14 (×4): qty 0.23

## 2024-01-14 MED ORDER — MELATONIN 3 MG PO TABS
3.0000 mg | ORAL_TABLET | Freq: Every evening | ORAL | Status: DC | PRN
  Filled 2024-01-14: qty 1

## 2024-01-14 MED ORDER — MAGNESIUM GLUCONATE 500 (27 MG) MG PO TABS
500.0000 mg | ORAL_TABLET | Freq: Every day | ORAL | Status: DC
  Administered 2024-01-14 – 2024-01-17 (×4): 500 mg via ORAL
  Filled 2024-01-14 (×5): qty 1

## 2024-01-14 MED ORDER — SODIUM CHLORIDE 0.9 % IV SOLN
2.0000 g | INTRAVENOUS | Status: DC
  Administered 2024-01-15: 2 g via INTRAVENOUS
  Filled 2024-01-14: qty 20

## 2024-01-14 NOTE — Plan of Care (Signed)

## 2024-01-14 NOTE — H&P (Signed)
 History and Physical    Nicholas Burton GNF:[id number] DOB: [birth year] DOA: 01/14/2024  Patient coming from: Skilled nursing facility.  Chief Complaint: Confusion.  History obtained from patient's son and old records.  HPI: Nicholas Burton is a 89 y.o. male with history of diabetes mellitus type 2, metastatic prostate cancer, hypertension, CHF, dementia, BPH, chronic indwelling Foley catheter secondary to obstructive uropathy was brought to the ER after patient became acutely confused since this morning.  As per the patient's son patient was doing fine yesterday.  Usually ambulates with the help of a walker.  Since this morning patient was confused not himself.  Patient was recently placed on medication for overactive bladder.  Also was accidentally given 1 dose of Klonopin 1 mg 2 days ago.  ED Course: In the ER patient is afebrile but appears confused but following commands.  CT head shows nonspecific findings for which MRI brain was suggested.  UA is concerning for UTI.  Foley catheter was changed and patient was started on ceftriaxone .  Admitted for acute encephalopathy suspect likely from UTI.  Labs show creatinine 1.4 potassium 5.3 repeat 1 was 4.5.  Hemoglobin 11.7.  TSH 6.2 hemoglobin A1c 6.9.  EKG shows normal sinus rhythm.  Review of Systems: As per HPI, rest all negative.   Past Medical History:  Diagnosis Date   Allergy    ANEMIA ASSOCIATED W/OTHER [facility] NUTRITIONAL DEFIC 08/11/2010   Qualifier: Diagnosis of  By: Rochelle Chu MD, Randa Burton.    Cancer [facility])    skin   Cataract    Chronic combined systolic and diastolic congestive heart failure (HCC) 10/30/2018   COPD with asthma (HCC) 03/02/2017   CRI (chronic renal insufficiency), stage 3 (moderate) (HCC) 10/25/2018   Deficiency anemia 10/25/2018   Diabetes mellitus    type 2   DOE (dyspnea on exertion) 09/03/2018   Edema 06/11/2009   Qualifier: Diagnosis of  By: Rochelle Chu MD, Randa Burton.    History of skin cancer    Hyperlipidemia with target LDL  less than 100 11/10/2008        Hypertension    Memory loss 05/14/2009   Obesity (BMI 30.0-34.9) 04/01/2013   Osteoarthritis    PSA elevation 10/25/2018   SKIN CANCER, HX OF 11/10/2008   Qualifier: Diagnosis of  By: Rochelle Chu MD, Randa Burton.    Snoring 04/03/2014   Type II diabetes mellitus with manifestations (HCC) 11/10/2008   Estimated Creatinine Clearance: 38 mL/min (A) (by C-G formula based on SCr of 1.52 mg/dL (H)).    Venous stasis dermatitis of both lower extremities 01/29/2018   Vitamin D  deficiency 11/10/2008    Past Surgical History:  Procedure Laterality Date   CHOLECYSTECTOMY     INGUINAL HERNIA REPAIR     x 3   IR FLUORO GUIDED NEEDLE PLC ASPIRATION/INJECTION LOC  10/07/2022   JOINT REPLACEMENT     KNEE SURGERY     x 2   MELANOMA EXCISION     x 3 -- Left arm   TOTAL KNEE ARTHROPLASTY     x 3     reports that he has never smoked. He has never used smokeless tobacco. He reports that he does not drink alcohol and does not use drugs.  Allergies  Allergen Reactions   Ace Inhibitors Cough   Oxycodone Itching and Rash   Penicillins Itching and Rash    Family History  Problem Relation Age of Onset   Heart disease Mother    Diabetes Mother  Heart disease Father    Cancer Sister    Cancer Brother    Diabetes Son    Diabetes Other     Prior to Admission medications   Medication Sig Start Date End Date Taking? Authorizing Provider  acetaminophen  (TYLENOL ) 325 MG tablet Take 1-2 tablets (325-650 mg total) by mouth every 6 (six) hours as needed for mild pain. 10/21/22   Angiulli, Everlyn Hockey, PA-C  cholecalciferol  (VITAMIN D ) 1000 units tablet Take 1 tablet (1,000 Units total) by mouth daily. Patient taking differently: Take 1,000 Units by mouth every evening. 10/21/22   Angiulli, Everlyn Hockey, PA-C  Colesevelam  HCl 3.75 g PACK Take 1 packet by mouth daily. 09/21/23   Arcadio Knuckles, MD  cyanocobalamin  (VITAMIN B12) 500 MCG tablet Take 1 tablet (500 mcg total) by mouth daily. 10/21/22    Angiulli, Everlyn Hockey, PA-C  enzalutamide  (XTANDI ) 80 MG tablet Take 1 tablet (80 mg total) by mouth daily. 11/15/23   Frankie Israel, MD  guaiFENesin -dextromethorphan (ROBITUSSIN DM) 100-10 MG/5ML syrup Take 5 mLs by mouth every 4 (four) hours as needed for cough. 07/22/23   Rai, Hurman Maiden, MD  HYDROcodone  bit-homatropine (HYCODAN) 5-1.5 MG/5ML syrup Take 5 mLs by mouth every 8 (eight) hours as needed for cough. 08/25/23   Arcadio Knuckles, MD  insulin  aspart (NOVOLOG ) 100 UNIT/ML injection Inject 0-9 Units into the skin 3 (three) times daily with meals. Sliding scale CBG 70 - 120: 0 units CBG 121 - 150: 1 unit,  CBG 151 - 200: 2 units,  CBG 201 - 250: 3 units,  CBG 251 - 300: 5 units,  CBG 301 - 350: 7 units,  CBG 351 - 400: 9 units   CBG > 400: 9 units and notify your MD 07/22/23   Rai, Hurman Maiden, MD  Insulin  Pen Needle (NOVOFINE) 30G X 8 MM MISC Inject 10 each into the skin as needed. 02/20/23   Arcadio Knuckles, MD  LANTUS  SOLOSTAR 100 UNIT/ML Solostar Pen ADMINISTER 23 UNITS UNDER THE SKIN AT BEDTIME 01/08/24   Arcadio Knuckles, MD  magnesium  gluconate (MAGONATE) 500 MG tablet Take 1 tablet (500 mg total) by mouth at bedtime. 10/21/22   Angiulli, Daniel J, PA-C  melatonin 3 MG TABS tablet Take 1 tablet (3 mg total) by mouth at bedtime as needed. 10/21/22   Angiulli, Daniel J, PA-C  metoprolol  tartrate (LOPRESSOR ) 25 MG tablet TAKE 1/2 TABLET BY MOUTH TWICE DAILY 08/09/23   Conte, Tessa N, PA-C  Multiple Vitamins-Minerals (PRESERVISION/LUTEIN) CAPS Take 1 capsule by mouth 2 (two) times daily.    [provider]  Potassium Chloride  ER 20 MEQ TBCR Take 1 tablet by mouth 3 (three) times daily. 08/14/23   [provider]  tamsulosin  (FLOMAX ) 0.4 MG CAPS capsule Take 1 capsule (0.4 mg total) by mouth daily. 09/21/23   Arcadio Knuckles, MD  torsemide  (DEMADEX ) 10 MG tablet Take 1 tablet (10 mg total) by mouth every other day. 09/21/23   Arcadio Knuckles, MD  Vibegron  (GEMTESA ) 75 MG TABS Take  1 tablet (75 mg total) by mouth daily. 12/06/23   Arcadio Knuckles, MD    Physical Exam: Constitutional: Moderately built and nourished. Vitals:   01/14/24 1645 01/14/24 1715 01/14/24 2157  BP: 124/71 124/72 (!) 143/98  Pulse: 94 91 92  Resp: 20 (!) 21 18  Temp:   98.3 F (36.8 C)  TempSrc:   Oral  SpO2: 100% 100% 95%   Eyes: Anicteric no pallor. ENMT:  No discharge from the ears eyes nose or mouth. Neck: No mass felt.  No neck rigidity. Respiratory: No rhonchi or crepitations. Cardiovascular: S1-S2 heard. Abdomen: Soft nontender bowel sound present. Musculoskeletal: No edema. Skin: No rash. Neurologic: Alert awake oriented to person moving all extremities. Psychiatric: Oriented to person.   Labs on Admission: I have personally reviewed following labs and imaging studies  CBC: Recent Labs  Lab 01/14/24 1643  WBC 9.3  NEUTROABS 5.9  HGB 11.7*  HCT 37.4*  MCV 92.1  PLT 189   Basic Metabolic Panel: Recent Labs  Lab 01/14/24 1643  NA 138  K 5.3*  CL 108  CO2 22  GLUCOSE 264*  BUN 19  CREATININE 1.45*  CALCIUM  8.9   GFR: CrCl cannot be calculated (Unknown ideal weight.). Liver Function Tests: Recent Labs  Lab 01/14/24 1643  AST 16  ALT 14  ALKPHOS 116  BILITOT 0.5  PROT 5.3*  ALBUMIN 2.8*   No results for input(s): "LIPASE", "AMYLASE" in the last 168 hours. No results for input(s): "AMMONIA" in the last 168 hours. Coagulation Profile: No results for input(s): "INR", "PROTIME" in the last 168 hours. Cardiac Enzymes: No results for input(s): "CKTOTAL", "CKMB", "CKMBINDEX", "TROPONINI" in the last 168 hours. BNP (last 3 results) No results for input(s): "PROBNP" in the last 8760 hours. HbA1C: No results for input(s): "HGBA1C" in the last 72 hours. CBG: No results for input(s): "GLUCAP" in the last 168 hours. Lipid Profile: No results for input(s): "CHOL", "HDL", "LDLCALC", "TRIG", "CHOLHDL", "LDLDIRECT" in the last 72 hours. Thyroid  Function  Tests: No results for input(s): "TSH", "T4TOTAL", "FREET4", "T3FREE", "THYROIDAB" in the last 72 hours. Anemia Panel: No results for input(s): "VITAMINB12", "FOLATE", "FERRITIN", "TIBC", "IRON", "RETICCTPCT" in the last 72 hours. Urine analysis:    Component Value Date/Time   COLORURINE YELLOW 01/14/2024 1643   APPEARANCEUR TURBID (A) 01/14/2024 1643   LABSPEC 1.008 01/14/2024 1643   PHURINE 5.0 01/14/2024 1643   GLUCOSEU >=500 (A) 01/14/2024 1643   GLUCOSEU NEGATIVE 07/26/2022 1406   HGBUR SMALL (A) 01/14/2024 1643   BILIRUBINUR NEGATIVE 01/14/2024 1643   KETONESUR NEGATIVE 01/14/2024 1643   PROTEINUR 30 (A) 01/14/2024 1643   UROBILINOGEN 0.2 07/26/2022 1406   NITRITE POSITIVE (A) 01/14/2024 1643   LEUKOCYTESUR LARGE (A) 01/14/2024 1643   Sepsis Labs: @LABRCNTIP (procalcitonin:4,lacticidven:4) )No results found for this or any previous visit (from the past 240 hours).   Radiological Exams on Admission: CT Head Wo Contrast Result Date: 01/14/2024 CLINICAL DATA:  Altered mental status. EXAM: CT HEAD WITHOUT CONTRAST TECHNIQUE: Contiguous axial images were obtained from the base of the skull through the vertex without intravenous contrast. RADIATION DOSE REDUCTION: This exam was performed according to the departmental dose-optimization program which includes automated exposure control, adjustment of the mA and/or kV according to patient size and/or use of iterative reconstruction technique. COMPARISON:  07/18/2023 FINDINGS: Brain: Apparent decreased density in the inferior medial left temporal lobe, favored artifactual. No acute hemorrhage or subdural collection. Brain volume is normal for age. Periventricular and deep white matter hypodensity, typical of chronic small vessel ischemia. No midline shift or mass lesion/mass effect. Vascular: Atherosclerosis of skullbase vasculature without hyperdense vessel or abnormal calcification. Skull: Lucent lesion within the left occipital bone was  present on prior exam, although obscured by motion on that exam. This is been present since at least February 2024. Sinuses/Orbits: Chronic right maxillary sinus opacification. Other: None. IMPRESSION: 1. Apparent decreased density in the inferior medial left temporal lobe, favored artifactual. MRI  could be considered if clinically indicated, however should only be considered if patient is able to hold still. 2. Chronic small vessel ischemia. 3. Lucent lesion within the left occipital bone was present on prior exam. This has been present since at least February 2024. This is nonspecific in etiology, however given history of metastatic prostate cancer, cannot exclude metastatic disease. Electronically Signed   By: Chadwick Colonel M.D.   On: 01/14/2024 18:29    EKG: Independently reviewed.  Normal sinus rhythm.  Assessment/Plan Active Problems:   HLD (hyperlipidemia)   Essential hypertension   B12 deficiency   BPH (benign prostatic hyperplasia)   COPD with asthma (HCC)   CRI (chronic renal insufficiency), stage 3 (moderate)   Chronic diastolic CHF (congestive heart failure) (HCC)   Prostate cancer metastatic to bone (HCC)   Insulin  dependent type 2 diabetes mellitus (HCC)   Acute encephalopathy    Acute encephalopathy suspect could be from UTI.  Patient's Foley catheter was changed and started on ceftriaxone  follow cultures.  CT head does show decreased density in the inferior medial left temporal lobe favoring artifactual but MRI should be considered for which I ordered MRI. Diabetes mellitus type 2 last hemoglobin A1c was 6.9 today.  On insulin  glargine 23 units at bedtime with sliding scale coverage. History of CHF takes torsemide  we will hold it for now and follow blood pressure trends and respiratory status. Hypertension on metoprolol  and torsemide .  Continue metoprolol  hold torsemide . History of metastatic prostate cancer being followed by Dr. Salomon Cree on Lupron  and Xtandi . Anemia appears to  be chronic follow CBC check anemia panel. Chronic kidney disease stage III creatinine around baseline. Chronic indwelling Foley catheter secondary to chronic urinary retention and history of BPH Foley catheter was changed in the ER.  Continue tamsulosin . History of dementia.  Since patient has acute encephalopathy will need close monitoring further workup and more than 2 midnight stay.   DVT prophylaxis: Heparin . Code Status: DNR confirmed with patient's son. Family Communication: Patient and son. Disposition Plan: Medical floor. Consults called: None. Admission status: Observation.

## 2024-01-14 NOTE — ED Notes (Signed)
 This nurse called [facility] to have patient monitored.

## 2024-01-14 NOTE — ED Notes (Signed)
 CN Nicholas Burton agreed to allow pt to go up now.

## 2024-01-14 NOTE — ED Provider Notes (Signed)
 [place] EMERGENCY DEPARTMENT AT [facility] Provider Note  CSN: [id number] Arrival date & time: 01/14/24 1627  Chief Complaint(s) Altered Mental Status  HPI Nicholas Burton is a 89 y.o. male coming from assisted living.  At his baseline, patient is alert and oriented x 3.  Reportedly, the patient was excellently provided with his wife's Klonopin yesterday, has been confused since.  Patient has a history of an indwelling Foley catheter.  Staff at assisted living was concerned because patient seemed a bit more confused, and they noticed that there was sediment in his indwelling Foley catheter.   Past Medical History Past Medical History:  Diagnosis Date   Allergy    ANEMIA ASSOCIATED W/OTHER [facility] NUTRITIONAL DEFIC 08/11/2010   Qualifier: Diagnosis of  By: Rochelle Chu MD, Randa Burton.    Cancer [facility])    skin   Cataract    Chronic combined systolic and diastolic congestive heart failure (HCC) 10/30/2018   COPD with asthma (HCC) 03/02/2017   CRI (chronic renal insufficiency), stage 3 (moderate) (HCC) 10/25/2018   Deficiency anemia 10/25/2018   Diabetes mellitus    type 2   DOE (dyspnea on exertion) 09/03/2018   Edema 06/11/2009   Qualifier: Diagnosis of  By: Rochelle Chu MD, Randa Burton.    History of skin cancer    Hyperlipidemia with target LDL less than 100 11/10/2008        Hypertension    Memory loss 05/14/2009   Obesity (BMI 30.0-34.9) 04/01/2013   Osteoarthritis    PSA elevation 10/25/2018   SKIN CANCER, HX OF 11/10/2008   Qualifier: Diagnosis of  By: Rochelle Chu MD, Randa Burton.    Snoring 04/03/2014   Type II diabetes mellitus with manifestations (HCC) 11/10/2008   Estimated Creatinine Clearance: 38 mL/min (A) (by C-G formula based on SCr of 1.52 mg/dL (H)).    Venous stasis dermatitis of both lower extremities 01/29/2018   Vitamin D  deficiency 11/10/2008   Patient Active Problem List   Diagnosis Date Noted   Sensorineural hearing loss, bilateral 10/19/2023   COVID-19 virus infection 07/19/2023    Need for immunization against influenza 06/22/2023   Insulin  dependent type 2 diabetes mellitus (HCC) 02/20/2023   Chronic combined systolic and diastolic congestive heart failure (HCC) 02/20/2023   Bilateral leg edema 11/24/2022   Prostate cancer metastatic to bone (HCC) 10/13/2022   Counseling regarding advance care planning and goals of care 10/13/2022   Spinal stenosis of lumbar region with neurogenic claudication 09/21/2022   Encounter for palliative care involving management of pain 09/21/2022   Impacted cerumen of right ear 04/25/2022   Subacute cough 09/21/2020   Late onset Alzheimer's dementia without behavioral disturbance (HCC) 09/21/2020   Swimmer's ear, right 06/06/2019   Iron deficiency anemia secondary to inadequate dietary iron intake 06/06/2019   Chronic diastolic CHF (congestive heart failure) (HCC) 10/30/2018   CRI (chronic renal insufficiency), stage 3 (moderate) 10/25/2018   Venous stasis dermatitis of both lower extremities 01/29/2018   COPD with asthma (HCC) 03/02/2017   Obesity (BMI 30.0-34.9) 04/01/2013   BPH (benign prostatic hyperplasia) 09/08/2011   B12 deficiency 08/13/2010   Vitamin D  deficiency 11/10/2008   HLD (hyperlipidemia) 11/10/2008   Essential hypertension 11/10/2008   Osteoarthritis 11/10/2008   Home Medication(s) Prior to Admission medications   Medication Sig Start Date End Date Taking? Authorizing Provider  acetaminophen  (TYLENOL ) 325 MG tablet Take 1-2 tablets (325-650 mg total) by mouth every 6 (six) hours as needed for mild pain. 10/21/22   Angiulli, Everlyn Hockey,  PA-C  cholecalciferol  (VITAMIN D ) 1000 units tablet Take 1 tablet (1,000 Units total) by mouth daily. Patient taking differently: Take 1,000 Units by mouth every evening. 10/21/22   Angiulli, Everlyn Hockey, PA-C  Colesevelam  HCl 3.75 g PACK Take 1 packet by mouth daily. 09/21/23   Arcadio Knuckles, MD  cyanocobalamin  (VITAMIN B12) 500 MCG tablet Take 1 tablet (500 mcg total) by mouth daily.  10/21/22   Angiulli, Everlyn Hockey, PA-C  enzalutamide  (XTANDI ) 80 MG tablet Take 1 tablet (80 mg total) by mouth daily. 11/15/23   Frankie Israel, MD  guaiFENesin -dextromethorphan (ROBITUSSIN DM) 100-10 MG/5ML syrup Take 5 mLs by mouth every 4 (four) hours as needed for cough. 07/22/23   Rai, Hurman Maiden, MD  HYDROcodone  bit-homatropine (HYCODAN) 5-1.5 MG/5ML syrup Take 5 mLs by mouth every 8 (eight) hours as needed for cough. 08/25/23   Arcadio Knuckles, MD  insulin  aspart (NOVOLOG ) 100 UNIT/ML injection Inject 0-9 Units into the skin 3 (three) times daily with meals. Sliding scale CBG 70 - 120: 0 units CBG 121 - 150: 1 unit,  CBG 151 - 200: 2 units,  CBG 201 - 250: 3 units,  CBG 251 - 300: 5 units,  CBG 301 - 350: 7 units,  CBG 351 - 400: 9 units   CBG > 400: 9 units and notify your MD 07/22/23   Rai, Hurman Maiden, MD  Insulin  Pen Needle (NOVOFINE) 30G X 8 MM MISC Inject 10 each into the skin as needed. 02/20/23   Arcadio Knuckles, MD  LANTUS  SOLOSTAR 100 UNIT/ML Solostar Pen ADMINISTER 23 UNITS UNDER THE SKIN AT BEDTIME 01/08/24   Arcadio Knuckles, MD  magnesium  gluconate (MAGONATE) 500 MG tablet Take 1 tablet (500 mg total) by mouth at bedtime. 10/21/22   Angiulli, Daniel J, PA-C  melatonin 3 MG TABS tablet Take 1 tablet (3 mg total) by mouth at bedtime as needed. 10/21/22   Angiulli, Daniel J, PA-C  metoprolol  tartrate (LOPRESSOR ) 25 MG tablet TAKE 1/2 TABLET BY MOUTH TWICE DAILY 08/09/23   Conte, Tessa N, PA-C  Multiple Vitamins-Minerals (PRESERVISION/LUTEIN) CAPS Take 1 capsule by mouth 2 (two) times daily.    [provider]  Potassium Chloride  ER 20 MEQ TBCR Take 1 tablet by mouth 3 (three) times daily. 08/14/23   [provider]  tamsulosin  (FLOMAX ) 0.4 MG CAPS capsule Take 1 capsule (0.4 mg total) by mouth daily. 09/21/23   Arcadio Knuckles, MD  torsemide  (DEMADEX ) 10 MG tablet Take 1 tablet (10 mg total) by mouth every other day. 09/21/23   Arcadio Knuckles, MD  Vibegron  (GEMTESA ) 75 MG  TABS Take 1 tablet (75 mg total) by mouth daily. 12/06/23   Arcadio Knuckles, MD                                                                                                                                    Past  Surgical History Past Surgical History:  Procedure Laterality Date   CHOLECYSTECTOMY     INGUINAL HERNIA REPAIR     x 3   IR FLUORO GUIDED NEEDLE PLC ASPIRATION/INJECTION LOC  10/07/2022   JOINT REPLACEMENT     KNEE SURGERY     x 2   MELANOMA EXCISION     x 3 -- Left arm   TOTAL KNEE ARTHROPLASTY     x 3   Family History Family History  Problem Relation Age of Onset   Heart disease Mother    Diabetes Mother    Heart disease Father    Cancer Sister    Cancer Brother    Diabetes Son    Diabetes Other     Social History Social History   Tobacco Use   Smoking status: Never   Smokeless tobacco: Never  Vaping Use   Vaping status: Never Used  Substance Use Topics   Alcohol use: No   Drug use: No   Allergies Ace inhibitors, Oxycodone, and Penicillins  Review of Systems Review of Systems  Physical Exam Vital Signs  I have reviewed the triage vital signs BP 124/71   Pulse 94   Resp 20   SpO2 100%   Physical Exam Vitals and nursing note reviewed.  HENT:     Head: Normocephalic and atraumatic.     Mouth/Throat:     Mouth: Mucous membranes are moist.  Eyes:     Pupils: Pupils are equal, round, and reactive to light.  Cardiovascular:     Rate and Rhythm: Normal rate.     Pulses: Normal pulses.  Pulmonary:     Effort: Pulmonary effort is normal.  Genitourinary:    Penis: Normal.   Musculoskeletal:        General: Normal range of motion.  Skin:    General: Skin is warm and dry.     Comments: No cellulitis observed in patient's lower extremities, abdomen trunk or back  Neurological:     Mental Status: He is alert.     Cranial Nerves: No cranial nerve deficit.     Motor: No weakness.     ED Results and Treatments Labs (all labs ordered are  listed, but only abnormal results are displayed) Labs Reviewed  COMPREHENSIVE METABOLIC PANEL WITH GFR - Abnormal; Notable for the following components:      Result Value   Potassium 5.3 (*)    Glucose, Bld 264 (*)    Creatinine, Ser 1.45 (*)    Total Protein 5.3 (*)    Albumin 2.8 (*)    GFR, Estimated 45 (*)    All other components within normal limits  CBC WITH DIFFERENTIAL/PLATELET - Abnormal; Notable for the following components:   RBC 4.06 (*)    Hemoglobin 11.7 (*)    HCT 37.4 (*)    RDW 15.7 (*)    All other components within normal limits  URINALYSIS, W/ REFLEX TO CULTURE (INFECTION SUSPECTED) - Abnormal; Notable for the following components:   APPearance TURBID (*)    Glucose, UA >=500 (*)    Hgb urine dipstick SMALL (*)    Protein, ur 30 (*)    Nitrite POSITIVE (*)    Leukocytes,Ua LARGE (*)    Bacteria, UA MANY (*)    All other components within normal limits  URINE CULTURE  RAPID URINE DRUG SCREEN, HOSP PERFORMED  Radiology CT Head Wo Contrast Result Date: 01/14/2024 CLINICAL DATA:  Altered mental status. EXAM: CT HEAD WITHOUT CONTRAST TECHNIQUE: Contiguous axial images were obtained from the base of the skull through the vertex without intravenous contrast. RADIATION DOSE REDUCTION: This exam was performed according to the departmental dose-optimization program which includes automated exposure control, adjustment of the mA and/or kV according to patient size and/or use of iterative reconstruction technique. COMPARISON:  07/18/2023 FINDINGS: Brain: Apparent decreased density in the inferior medial left temporal lobe, favored artifactual. No acute hemorrhage or subdural collection. Brain volume is normal for age. Periventricular and deep white matter hypodensity, typical of chronic small vessel ischemia. No midline shift or mass lesion/mass effect.  Vascular: Atherosclerosis of skullbase vasculature without hyperdense vessel or abnormal calcification. Skull: Lucent lesion within the left occipital bone was present on prior exam, although obscured by motion on that exam. This is been present since at least February 2024. Sinuses/Orbits: Chronic right maxillary sinus opacification. Other: None. IMPRESSION: 1. Apparent decreased density in the inferior medial left temporal lobe, favored artifactual. MRI could be considered if clinically indicated, however should only be considered if patient is able to hold still. 2. Chronic small vessel ischemia. 3. Lucent lesion within the left occipital bone was present on prior exam. This has been present since at least February 2024. This is nonspecific in etiology, however given history of metastatic prostate cancer, cannot exclude metastatic disease. Electronically Signed   By: Chadwick Colonel M.D.   On: 01/14/2024 18:29    Pertinent labs & imaging results that were available during my care of the patient were reviewed by me and considered in my medical decision making (see MDM for details).  Medications Ordered in ED Medications  cefTRIAXone (ROCEPHIN) 1 g in sodium chloride  0.9 % 100 mL IVPB (has no administration in time range)                                                                                                                                     Procedures Procedures  (including critical care time)  Medical Decision Making / ED Course   This patient presents to the ED for concern of altered mental status, this involves an extensive number of treatment options, and is a complaint that carries with it a high risk of complications and morbidity.  The differential diagnosis includes accidental benzodiazepine ingestion, cystitis, metabolic encephalopathy, CVA.  MDM: On exam, patient overall well-appearing.  He is nontachycardic, normotensive.  He is afebrile.  Patient does have an elevated  blood glucose per EMS.  Suspect the patient likely has urinary tract infection based on the appearance of his urine in his Foley catheter which is very cloudy and containing sediment.  Will obtain imaging of the patient's head.  Reassessment 6:50 PM-patient's CT head negative.  Urinalysis is consistent with cystitis.  Given his change from baseline, will admit patient for UTI, altered mental status.  Additional history obtained: -Additional history obtained from EMS -External records from outside source obtained and reviewed including: Chart review including previous notes, labs, imaging, consultation notes   Lab Tests: -I ordered, reviewed, and interpreted labs.   The pertinent results include:   Labs Reviewed  COMPREHENSIVE METABOLIC PANEL WITH GFR - Abnormal; Notable for the following components:      Result Value   Potassium 5.3 (*)    Glucose, Bld 264 (*)    Creatinine, Ser 1.45 (*)    Total Protein 5.3 (*)    Albumin 2.8 (*)    GFR, Estimated 45 (*)    All other components within normal limits  CBC WITH DIFFERENTIAL/PLATELET - Abnormal; Notable for the following components:   RBC 4.06 (*)    Hemoglobin 11.7 (*)    HCT 37.4 (*)    RDW 15.7 (*)    All other components within normal limits  URINALYSIS, W/ REFLEX TO CULTURE (INFECTION SUSPECTED) - Abnormal; Notable for the following components:   APPearance TURBID (*)    Glucose, UA >=500 (*)    Hgb urine dipstick SMALL (*)    Protein, ur 30 (*)    Nitrite POSITIVE (*)    Leukocytes,Ua LARGE (*)    Bacteria, UA MANY (*)    All other components within normal limits  URINE CULTURE  RAPID URINE DRUG SCREEN, HOSP PERFORMED      EKG sinus rhythm, right bundle branch block  EKG Interpretation Date/Time:  Sunday Jan 14 2024 16:39:56 EDT Ventricular Rate:  93 PR Interval:  153 QRS Duration:  133 QT Interval:  366 QTC Calculation: 456 R Axis:   -49  Text Interpretation: Sinus rhythm RBBB and LAFB Left ventricular  hypertrophy Confirmed by Afton Horse [phone]) on 01/14/2024 6:49:47 PM         Imaging Studies ordered: I ordered imaging studies including CT imaging of the head I independently visualized and interpreted imaging. I agree with the radiologist interpretation   Medicines ordered and prescription drug management: Meds ordered this encounter  Medications   cefTRIAXone (ROCEPHIN) 1 g in sodium chloride  0.9 % 100 mL IVPB    Antibiotic Indication::   UTI    -I have reviewed the patients home medicines and have made adjustments as needed  Cardiac Monitoring: The patient was maintained on a cardiac monitor.  I personally viewed and interpreted the cardiac monitored which showed an underlying rhythm of: Normal sinus rhythm  Social Determinants of Health:  Factors impacting patients care include: Multiple medical comorbidities including advanced age   Reevaluation: After the interventions noted above, I reevaluated the patient and found that they have :improved  Co morbidities that complicate the patient evaluation  Past Medical History:  Diagnosis Date   Allergy    ANEMIA ASSOCIATED W/OTHER [facility] NUTRITIONAL DEFIC 08/11/2010   Qualifier: Diagnosis of  By: Rochelle Chu MD, Randa Burton.    Cancer [facility])    skin   Cataract    Chronic combined systolic and diastolic congestive heart failure (HCC) 10/30/2018   COPD with asthma (HCC) 03/02/2017   CRI (chronic renal insufficiency), stage 3 (moderate) (HCC) 10/25/2018   Deficiency anemia 10/25/2018   Diabetes mellitus    type 2   DOE (dyspnea on exertion) 09/03/2018   Edema 06/11/2009   Qualifier: Diagnosis of  By: Rochelle Chu MD, Randa Burton.    History of skin cancer    Hyperlipidemia with target LDL less than 100 11/10/2008        Hypertension  Memory loss 05/14/2009   Obesity (BMI 30.0-34.9) 04/01/2013   Osteoarthritis    PSA elevation 10/25/2018   SKIN CANCER, HX OF 11/10/2008   Qualifier: Diagnosis of  By: Rochelle Chu MD, Randa Burton.    Snoring 04/03/2014    Type II diabetes mellitus with manifestations (HCC) 11/10/2008   Estimated Creatinine Clearance: 38 mL/min (A) (by C-G formula based on SCr of 1.52 mg/dL (H)).    Venous stasis dermatitis of both lower extremities 01/29/2018   Vitamin D  deficiency 11/10/2008      Dispostion: Admission     Final Clinical Impression(s) / ED Diagnoses Final diagnoses:  Transient alteration of awareness  Cystitis     @PCDICTATION @    Afton Horse T, DO 01/14/24 1850

## 2024-01-14 NOTE — ED Triage Notes (Signed)
 Pt BIB [facility] from [place] at [facility] for AMS. Pt was accidentally given Klonopin that was intended for his wife yesterday. Pt has been altered since yesterday, Aox1. Pt is Aox4 at baseline. Pt has hx of UTI's and has a foley catheter; Staff report decreased urine output that looks cloudy with sediment.   EMS vitals: CBG 354 120/80 98 HR 98.9 temp

## 2024-01-15 ENCOUNTER — Observation Stay (HOSPITAL_COMMUNITY)

## 2024-01-15 DIAGNOSIS — Y846 Urinary catheterization as the cause of abnormal reaction of the patient, or of later complication, without mention of misadventure at the time of the procedure: Secondary | ICD-10-CM | POA: Diagnosis present

## 2024-01-15 DIAGNOSIS — F0280 Dementia in other diseases classified elsewhere without behavioral disturbance: Secondary | ICD-10-CM | POA: Diagnosis present

## 2024-01-15 DIAGNOSIS — Z66 Do not resuscitate: Secondary | ICD-10-CM | POA: Diagnosis present

## 2024-01-15 DIAGNOSIS — Z79899 Other long term (current) drug therapy: Secondary | ICD-10-CM | POA: Diagnosis not present

## 2024-01-15 DIAGNOSIS — C7951 Secondary malignant neoplasm of bone: Secondary | ICD-10-CM | POA: Diagnosis present

## 2024-01-15 DIAGNOSIS — Z85828 Personal history of other malignant neoplasm of skin: Secondary | ICD-10-CM | POA: Diagnosis not present

## 2024-01-15 DIAGNOSIS — J4489 Other specified chronic obstructive pulmonary disease: Secondary | ICD-10-CM | POA: Diagnosis present

## 2024-01-15 DIAGNOSIS — E875 Hyperkalemia: Secondary | ICD-10-CM | POA: Diagnosis present

## 2024-01-15 DIAGNOSIS — N3281 Overactive bladder: Secondary | ICD-10-CM | POA: Diagnosis present

## 2024-01-15 DIAGNOSIS — Z833 Family history of diabetes mellitus: Secondary | ICD-10-CM | POA: Diagnosis not present

## 2024-01-15 DIAGNOSIS — G928 Other toxic encephalopathy: Secondary | ICD-10-CM | POA: Diagnosis present

## 2024-01-15 DIAGNOSIS — N3000 Acute cystitis without hematuria: Secondary | ICD-10-CM | POA: Diagnosis present

## 2024-01-15 DIAGNOSIS — R4182 Altered mental status, unspecified: Secondary | ICD-10-CM | POA: Diagnosis not present

## 2024-01-15 DIAGNOSIS — T83511A Infection and inflammatory reaction due to indwelling urethral catheter, initial encounter: Secondary | ICD-10-CM | POA: Diagnosis present

## 2024-01-15 DIAGNOSIS — G9340 Encephalopathy, unspecified: Secondary | ICD-10-CM

## 2024-01-15 DIAGNOSIS — I130 Hypertensive heart and chronic kidney disease with heart failure and stage 1 through stage 4 chronic kidney disease, or unspecified chronic kidney disease: Secondary | ICD-10-CM | POA: Diagnosis present

## 2024-01-15 DIAGNOSIS — E1122 Type 2 diabetes mellitus with diabetic chronic kidney disease: Secondary | ICD-10-CM | POA: Diagnosis present

## 2024-01-15 DIAGNOSIS — N138 Other obstructive and reflux uropathy: Secondary | ICD-10-CM | POA: Diagnosis present

## 2024-01-15 DIAGNOSIS — I5032 Chronic diastolic (congestive) heart failure: Secondary | ICD-10-CM | POA: Diagnosis present

## 2024-01-15 DIAGNOSIS — N1831 Chronic kidney disease, stage 3a: Secondary | ICD-10-CM | POA: Diagnosis present

## 2024-01-15 DIAGNOSIS — E785 Hyperlipidemia, unspecified: Secondary | ICD-10-CM | POA: Diagnosis present

## 2024-01-15 DIAGNOSIS — Z85820 Personal history of malignant melanoma of skin: Secondary | ICD-10-CM | POA: Diagnosis not present

## 2024-01-15 DIAGNOSIS — J320 Chronic maxillary sinusitis: Secondary | ICD-10-CM | POA: Diagnosis not present

## 2024-01-15 DIAGNOSIS — R404 Transient alteration of awareness: Secondary | ICD-10-CM | POA: Diagnosis present

## 2024-01-15 DIAGNOSIS — G301 Alzheimer's disease with late onset: Secondary | ICD-10-CM | POA: Diagnosis present

## 2024-01-15 DIAGNOSIS — C61 Malignant neoplasm of prostate: Secondary | ICD-10-CM | POA: Diagnosis present

## 2024-01-15 DIAGNOSIS — Z8249 Family history of ischemic heart disease and other diseases of the circulatory system: Secondary | ICD-10-CM | POA: Diagnosis not present

## 2024-01-15 DIAGNOSIS — Z794 Long term (current) use of insulin: Secondary | ICD-10-CM | POA: Diagnosis not present

## 2024-01-15 DIAGNOSIS — B9620 Unspecified Escherichia coli [E. coli] as the cause of diseases classified elsewhere: Secondary | ICD-10-CM | POA: Diagnosis present

## 2024-01-15 LAB — COMPREHENSIVE METABOLIC PANEL WITH GFR
ALT: 14 U/L (ref 0–44)
AST: 16 U/L (ref 15–41)
Albumin: 2.6 g/dL — ABNORMAL LOW (ref 3.5–5.0)
Alkaline Phosphatase: 128 U/L — ABNORMAL HIGH (ref 38–126)
Anion gap: 7 (ref 5–15)
BUN: 17 mg/dL (ref 8–23)
CO2: 26 mmol/L (ref 22–32)
Calcium: 8.8 mg/dL — ABNORMAL LOW (ref 8.9–10.3)
Chloride: 105 mmol/L (ref 98–111)
Creatinine, Ser: 1.36 mg/dL — ABNORMAL HIGH (ref 0.61–1.24)
GFR, Estimated: 48 mL/min — ABNORMAL LOW (ref >60)
Glucose, Bld: 219 mg/dL — ABNORMAL HIGH (ref 70–99)
Potassium: 4.4 mmol/L (ref 3.5–5.1)
Sodium: 138 mmol/L (ref 135–145)
Total Bilirubin: 0.7 mg/dL (ref 0.0–1.2)
Total Protein: 5.3 g/dL — ABNORMAL LOW (ref 6.5–8.1)

## 2024-01-15 LAB — CBC
HCT: 39.1 % (ref 39.0–52.0)
Hemoglobin: 12.3 g/dL — ABNORMAL LOW (ref 13.0–17.0)
MCH: 28.7 pg (ref 26.0–34.0)
MCHC: 31.5 g/dL (ref 30.0–36.0)
MCV: 91.4 fL (ref 80.0–100.0)
Platelets: 179 10*3/uL (ref 150–400)
RBC: 4.28 MIL/uL (ref 4.22–5.81)
RDW: 15.5 % (ref 11.5–15.5)
WBC: 9.5 10*3/uL (ref 4.0–10.5)
nRBC: 0 % (ref 0.0–0.2)

## 2024-01-15 LAB — VITAMIN B12: Vitamin B-12: 356 pg/mL (ref 180–914)

## 2024-01-15 LAB — RESPIRATORY PANEL BY PCR

## 2024-01-15 LAB — GLUCOSE, CAPILLARY
Glucose-Capillary: 204 mg/dL — ABNORMAL HIGH (ref 70–99)
Glucose-Capillary: 107 mg/dL — ABNORMAL HIGH (ref 70–99)
Glucose-Capillary: 177 mg/dL — ABNORMAL HIGH (ref 70–99)
Glucose-Capillary: 308 mg/dL — ABNORMAL HIGH (ref 70–99)

## 2024-01-15 LAB — SARS CORONAVIRUS 2 BY RT PCR: SARS Coronavirus 2 by RT PCR: NEGATIVE

## 2024-01-15 LAB — T4, FREE: Free T4: 1.03 ng/dL (ref 0.61–1.12)

## 2024-01-15 MED ORDER — METOPROLOL TARTRATE 12.5 MG HALF TABLET
12.5000 mg | ORAL_TABLET | Freq: Two times a day (BID) | ORAL | Status: DC
  Administered 2024-01-15 – 2024-01-18 (×7): 12.5 mg via ORAL
  Filled 2024-01-15 (×7): qty 1

## 2024-01-15 MED ORDER — SODIUM CHLORIDE 0.9 % IV SOLN
1.0000 g | INTRAVENOUS | Status: DC
  Administered 2024-01-16 – 2024-01-18 (×3): 1 g via INTRAVENOUS
  Filled 2024-01-15 (×3): qty 10

## 2024-01-15 MED ORDER — ENOXAPARIN SODIUM 40 MG/0.4ML IJ SOSY
40.0000 mg | PREFILLED_SYRINGE | INTRAMUSCULAR | Status: DC
  Administered 2024-01-16 – 2024-01-18 (×3): 40 mg via SUBCUTANEOUS
  Filled 2024-01-15 (×3): qty 0.4

## 2024-01-15 MED ORDER — CHLORHEXIDINE GLUCONATE CLOTH 2 % EX PADS
6.0000 | MEDICATED_PAD | Freq: Every day | CUTANEOUS | Status: DC
  Administered 2024-01-15 – 2024-01-17 (×3): 6 via TOPICAL

## 2024-01-15 NOTE — Progress Notes (Signed)
 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   [facility] will continue to follow for discharge disposition.   Please call with any [facility] related questions or concerns.   Thank you, Ardine Beckwith, LPN [phone]

## 2024-01-15 NOTE — Plan of Care (Signed)

## 2024-01-15 NOTE — Progress Notes (Addendum)
 PROGRESS NOTE    Nicholas Burton  WUJ:[id number] DOB: [birth year] DOA: 01/14/2024 PCP: Chanetta Comes, [facility]  Outpatient Specialists: oncology, cardiology    Brief Narrative:   From admission h and p  Nicholas Burton is a 89 y.o. male with history of diabetes mellitus type 2, metastatic prostate cancer, hypertension, CHF, dementia, BPH, chronic indwelling Foley catheter secondary to obstructive uropathy was brought to the ER after patient became acutely confused since this morning.  As per the patient's son patient was doing fine yesterday.  Usually ambulates with the help of a walker.  Since this morning patient was confused not himself.  Patient was recently placed on medication for overactive bladder.  Also was accidentally given 1 dose of Klonopin 1 mg 2 days ago.     Assessment & Plan:   Active Problems:   HLD (hyperlipidemia)   Essential hypertension   B12 deficiency   BPH (benign prostatic hyperplasia)   COPD with asthma (HCC)   CRI (chronic renal insufficiency), stage 3 (moderate)   Chronic diastolic CHF (congestive heart failure) (HCC)   Late onset Alzheimer's dementia without behavioral disturbance (HCC)   Prostate cancer metastatic to bone (HCC)   Insulin  dependent type 2 diabetes mellitus (HCC)   Chronic combined systolic and diastolic congestive heart failure (HCC)   Acute encephalopathy  # Acute encephalopathy # Dementia Possibly fro uti. Mri motion degraded but nothing acute. No focal symptoms other than family report of recent cough. Was accidentally given a benzodiazepine a couple of days ago. Also recent move to ALF with baseline dementia, this could be the delirium that can accompany dementia as it advanced. No significant lab abnormalities other than mildly elevated tsh - f/u t4, b12 - check covid/rvp/cxr - check blood cultures - PT consult  # Acute cystitis # Chronic foley As above. No fever or white count, hemodynamically stable. Foley exchanged 5/19 - f/u  cultures - continue ceftriaxone  for now  # T2DM Controlled - basal/bolus  # HFrecoveredEF Appears euvolemic - home torsemide  on hold  # HTN Bp controlled - home metop, torsemide   # Hx metastatic prostate cancer # BPH Followed by onc - cont lupron , xtandi  - cont home flomax , myrbetriq   # CKD 3a stable   DVT prophylaxis: lovenox  Code Status: dnr/dni Family Communication: daughter-in-law updated @ bedside 5/19  Level of care: Telemetry Medical Status is: Observation    Consultants:  none  Procedures: none  Antimicrobials:  ceftriaxone     Subjective: No complaints. confused  Objective: Vitals:   01/14/24 2238 01/15/24 0835 01/15/24 0902 01/15/24 1222  BP: (!) 143/98 (!) 146/96 (!) 146/96 122/67  Pulse: 92 100 100 90  Resp: 18 17    Temp: 98.3 F (36.8 C) 98 F (36.7 C)  98.8 F (37.1 C)  TempSrc: Oral Oral  Oral  SpO2: 95% 99%  99%  Weight: 74.8 kg     Height: 5\' 8"  (1.727 m)       Intake/Output Summary (Last 24 hours) at 01/15/2024 1536 Last data filed at 01/15/2024 1000 Gross per 24 hour  Intake --  Output 2925 ml  Net -2925 ml   Filed Weights   01/14/24 2238  Weight: 74.8 kg    Examination:  General exam: Appears calm and comfortable  Respiratory system: Clear to auscultation. Respiratory effort normal. Cardiovascular system: S1 & S2 heard, RRR.   Gastrointestinal system: Abdomen is nondistended, soft and nontender. Central nervous system: Alert, moving all 4 Extremities: Symmetric 5 x 5 power. Skin: No rashes,  lesions or ulcers Psychiatry: confused, doesn't respond meaningfully    Data Reviewed: I have personally reviewed following labs and imaging studies  CBC: Recent Labs  Lab 01/14/24 1643 01/14/24 2233 01/15/24 0720  WBC 9.3 8.3 9.5  NEUTROABS 5.9  --   --   HGB 11.7* 12.0* 12.3*  HCT 37.4* 38.0* 39.1  MCV 92.1 91.6 91.4  PLT 189 194 179   Basic Metabolic Panel: Recent Labs  Lab 01/14/24 1643 01/14/24 2233  01/15/24 0720  NA 138 138 138  K 5.3* 4.5 4.4  CL 108 107 105  CO2 22 22 26   GLUCOSE 264* 183* 219*  BUN 19 18 17   CREATININE 1.45* 1.36* 1.36*  CALCIUM  8.9 8.7* 8.8*   GFR: Estimated Creatinine Clearance: 32.1 mL/min (A) (by C-G formula based on SCr of 1.36 mg/dL (H)). Liver Function Tests: Recent Labs  Lab 01/14/24 1643 01/15/24 0720  AST 16 16  ALT 14 14  ALKPHOS 116 128*  BILITOT 0.5 0.7  PROT 5.3* 5.3*  ALBUMIN 2.8* 2.6*   No results for input(s): "LIPASE", "AMYLASE" in the last 168 hours. Recent Labs  Lab 01/14/24 2233  AMMONIA 16   Coagulation Profile: No results for input(s): "INR", "PROTIME" in the last 168 hours. Cardiac Enzymes: No results for input(s): "CKTOTAL", "CKMB", "CKMBINDEX", "TROPONINI" in the last 168 hours. BNP (last 3 results) No results for input(s): "PROBNP" in the last 8760 hours. HbA1C: Recent Labs    01/14/24 2233  HGBA1C 6.9*   CBG: Recent Labs  Lab 01/14/24 2325 01/15/24 0843 01/15/24 1219  GLUCAP 201* 204* 107*   Lipid Profile: No results for input(s): "CHOL", "HDL", "LDLCALC", "TRIG", "CHOLHDL", "LDLDIRECT" in the last 72 hours. Thyroid  Function Tests: Recent Labs    01/14/24 2233  TSH 6.238*   Anemia Panel: No results for input(s): "VITAMINB12", "FOLATE", "FERRITIN", "TIBC", "IRON", "RETICCTPCT" in the last 72 hours. Urine analysis:    Component Value Date/Time   COLORURINE YELLOW 01/14/2024 1643   APPEARANCEUR TURBID (A) 01/14/2024 1643   LABSPEC 1.008 01/14/2024 1643   PHURINE 5.0 01/14/2024 1643   GLUCOSEU >=500 (A) 01/14/2024 1643   GLUCOSEU NEGATIVE 07/26/2022 1406   HGBUR SMALL (A) 01/14/2024 1643   BILIRUBINUR NEGATIVE 01/14/2024 1643   KETONESUR NEGATIVE 01/14/2024 1643   PROTEINUR 30 (A) 01/14/2024 1643   UROBILINOGEN 0.2 07/26/2022 1406   NITRITE POSITIVE (A) 01/14/2024 1643   LEUKOCYTESUR LARGE (A) 01/14/2024 1643   Sepsis Labs: @LABRCNTIP (procalcitonin:4,lacticidven:4)  )No results found for  this or any previous visit (from the past 240 hours).       Radiology Studies: MR BRAIN WO CONTRAST Result Date: 01/15/2024 CLINICAL DATA:  89 year old male with altered mental status. Questionable abnormal left medial temporal lobe on CT. EXAM: MRI HEAD WITHOUT CONTRAST TECHNIQUE: Multiplanar, multiecho pulse sequences of the brain and surrounding structures were obtained without intravenous contrast. COMPARISON:  Head CT yesterday.  Brain MRI 02/28/2004. FINDINGS: The examination had to be discontinued prior to completion due to patient agitation. No axial FLAIR imaging obtained. Remaining study intermittently motion degraded. Brain: No restricted diffusion to suggest acute infarction. No midline shift, mass effect, evidence of mass lesion, ventriculomegaly, extra-axial collection or acute intracranial hemorrhage. Cervicomedullary junction and pituitary are within normal limits. Chronic giant arachnoid granulation of the left posterior fossa (series 5, image 10), normal variant), mildly larger since 2005. Patchy and symmetric, mild for age periventricular T2 hyperintensity. No definite cortical encephalomalacia, chronic cerebral blood products. Vascular: Major intracranial vascular flow voids are stable since  2005. Dominant left vertebral artery again noted. Skull and upper cervical spine: Background bone marrow signal is normal. Negative for age visible cervical spine. Sinuses/Orbits: Chronic right maxillary sinus disease. Postoperative changes to both globes. Other: Trace mastoid air cell fluid, negative nasopharynx, appears to be inconsequential. IMPRESSION: Mildly truncated, motion degraded exam despite repeated imaging attempts. No acute intracranial abnormality identified. Electronically Signed   By: Marlise Simpers M.D.   On: 01/15/2024 13:13   CT Head Wo Contrast Result Date: 01/14/2024 CLINICAL DATA:  Altered mental status. EXAM: CT HEAD WITHOUT CONTRAST TECHNIQUE: Contiguous axial images were obtained  from the base of the skull through the vertex without intravenous contrast. RADIATION DOSE REDUCTION: This exam was performed according to the departmental dose-optimization program which includes automated exposure control, adjustment of the mA and/or kV according to patient size and/or use of iterative reconstruction technique. COMPARISON:  07/18/2023 FINDINGS: Brain: Apparent decreased density in the inferior medial left temporal lobe, favored artifactual. No acute hemorrhage or subdural collection. Brain volume is normal for age. Periventricular and deep white matter hypodensity, typical of chronic small vessel ischemia. No midline shift or mass lesion/mass effect. Vascular: Atherosclerosis of skullbase vasculature without hyperdense vessel or abnormal calcification. Skull: Lucent lesion within the left occipital bone was present on prior exam, although obscured by motion on that exam. This is been present since at least February 2024. Sinuses/Orbits: Chronic right maxillary sinus opacification. Other: None. IMPRESSION: 1. Apparent decreased density in the inferior medial left temporal lobe, favored artifactual. MRI could be considered if clinically indicated, however should only be considered if patient is able to hold still. 2. Chronic small vessel ischemia. 3. Lucent lesion within the left occipital bone was present on prior exam. This has been present since at least February 2024. This is nonspecific in etiology, however given history of metastatic prostate cancer, cannot exclude metastatic disease. Electronically Signed   By: Chadwick Colonel M.D.   On: 01/14/2024 18:29        Scheduled Meds:  Chlorhexidine  Gluconate Cloth  6 each Topical Daily   colesevelam   1,875 mg Oral BID WC   cyanocobalamin   500 mcg Oral Daily   enzalutamide   80 mg Oral Daily   heparin   5,000 Units Subcutaneous Q8H   insulin  aspart  0-9 Units Subcutaneous TID WC   insulin  glargine-yfgn  23 Units Subcutaneous QHS    magnesium  gluconate  500 mg Oral QHS   metoprolol  tartrate  12.5 mg Oral BID   mirabegron  ER  25 mg Oral Daily   multivitamin  1 tablet Oral BID   tamsulosin   0.4 mg Oral Daily   Continuous Infusions:  cefTRIAXone  (ROCEPHIN )  IV 2 g (01/15/24 0935)     LOS: 0 days     Raymonde Calico, MD [facility]   If 7PM-7AM, please contact night-coverage [email or web address] Password [facility] 01/15/2024, 3:36 PM

## 2024-01-15 NOTE — Plan of Care (Signed)
  Problem: Education: Goal: Knowledge of General Education information will improve Description: Including pain rating scale, medication(s)/side effects and non-pharmacologic comfort measures Outcome: Progressing   Problem: Health Behavior/Discharge Planning: Goal: Ability to manage health-related needs will improve Outcome: Progressing   Problem: Clinical Measurements: Goal: Cardiovascular complication will be avoided Outcome: Progressing   Problem: Clinical Measurements: Goal: Respiratory complications will improve Outcome: Progressing   Problem: Activity: Goal: Risk for activity intolerance will decrease Outcome: Progressing   Problem: Nutrition: Goal: Adequate nutrition will be maintained Outcome: Progressing   Problem: Coping: Goal: Level of anxiety will decrease Outcome: Progressing   Problem: Pain Managment: Goal: General experience of comfort will improve and/or be controlled Outcome: Progressing   Problem: Safety: Goal: Ability to remain free from injury will improve Outcome: Progressing   Problem: Skin Integrity: Goal: Risk for impaired skin integrity will decrease Outcome: Progressing

## 2024-01-16 DIAGNOSIS — G9340 Encephalopathy, unspecified: Secondary | ICD-10-CM | POA: Diagnosis not present

## 2024-01-16 LAB — GLUCOSE, CAPILLARY
Glucose-Capillary: 108 mg/dL — ABNORMAL HIGH (ref 70–99)
Glucose-Capillary: 179 mg/dL — ABNORMAL HIGH (ref 70–99)
Glucose-Capillary: 187 mg/dL — ABNORMAL HIGH (ref 70–99)
Glucose-Capillary: 202 mg/dL — ABNORMAL HIGH (ref 70–99)

## 2024-01-16 MED ORDER — POLYETHYLENE GLYCOL 3350 17 G PO PACK
17.0000 g | PACK | Freq: Every day | ORAL | Status: DC
  Administered 2024-01-16 – 2024-01-18 (×3): 17 g via ORAL
  Filled 2024-01-16 (×3): qty 1

## 2024-01-16 MED ORDER — TORSEMIDE 20 MG PO TABS
10.0000 mg | ORAL_TABLET | ORAL | Status: DC
  Administered 2024-01-16 – 2024-01-18 (×2): 10 mg via ORAL
  Filled 2024-01-16 (×2): qty 1

## 2024-01-16 MED ORDER — GUAIFENESIN ER 600 MG PO TB12
600.0000 mg | ORAL_TABLET | Freq: Two times a day (BID) | ORAL | Status: DC
  Administered 2024-01-16 – 2024-01-18 (×4): 600 mg via ORAL
  Filled 2024-01-16 (×4): qty 1

## 2024-01-16 MED ORDER — POTASSIUM CHLORIDE CRYS ER 20 MEQ PO TBCR
EXTENDED_RELEASE_TABLET | Freq: Every day | ORAL | Status: DC
  Administered 2024-01-16 – 2024-01-18 (×3): 20 meq via ORAL
  Filled 2024-01-16 (×3): qty 1

## 2024-01-16 NOTE — Progress Notes (Signed)
 Per patient and family request foley catheter is needed to be exchanged due to having the same foley catheter in for several months. Family was advised that foley catheter was changed at ED. Patient still wanted foley catheter exchanged. PA J. Sharion Davidson made aware.

## 2024-01-16 NOTE — Progress Notes (Signed)
 Pt son inquired if foley catheter had been exchanged since admission. Per MD note, catheter was exchanged in ED. Pt personal drainage bag noted attached to catheter. Son the bring new drainage bag this morning for exchange.

## 2024-01-16 NOTE — Evaluation (Signed)
 Physical Therapy Evaluation Patient Details Name: Nicholas Burton MRN: [id number] DOB: Nov 10, 1927 Today's Date: 01/16/2024  History of Present Illness  Nicholas Burton is a 89 y.o. male was brought to the ER after patient became acutely confused;  Patient was recently placed on medication for overactive bladder.  Also was accidentally given 1 dose of Klonopin 1 mg 2 days PTA;  with history of diabetes mellitus type 2, metastatic prostate cancer, hypertension, CHF, dementia, BPH, chronic indwelling Foley catheter secondary to obstructive uropathy  Clinical Impression   Pt admitted with above diagnosis. Lives ALF, [name]; Per chart review, Prior to admission, pt was able to walk with RW; Presents to PT with generalized weakness, functional dependencies, incr fall risk;  Needs consistent min assist for safety with sit<>stand and Melby-distance ambulation with RW; In considering options for discharge, I value going back to familiar environment, caregivers, and routines for patients with dementia; If [facility] can meet his safety and supervision needs rec return there with HHPT follow-up; Pt currently with functional limitations due to the deficits listed below (see PT Problem List). Pt will benefit from skilled PT to increase their independence and safety with mobility to allow discharge to the venue listed below.           If plan is discharge home, recommend the following: A little help with walking and/or transfers;A lot of help with bathing/dressing/bathroom   Can travel by private vehicle        Equipment Recommendations Other (comment) (Anticipate that he's well-equipped, but need mroe information re: DME he already has)  Recommendations for Other Services  OT consult    Functional Status Assessment Patient has had a recent decline in their functional status and demonstrates the ability to make significant improvements in function in a reasonable and predictable amount of time.      Precautions / Restrictions Precautions Precautions: Fall Recall of Precautions/Restrictions: Impaired Restrictions Weight Bearing Restrictions Per Provider Order: No      Mobility  Bed Mobility Overal bed mobility: Needs Assistance Bed Mobility: Supine to Sit     Supine to sit: Min assist     General bed mobility comments: Lots of cues to initaite; min handheld assist to pull to sit    Transfers Overall transfer level: Needs assistance Equipment used: Rolling walker (2 wheels) Transfers: Sit to/from Stand Sit to Stand: Min assist           General transfer comment: Min assist to rise; cues for hand placement and safety, particularly with stand to sit    Ambulation/Gait Ambulation/Gait assistance: Min assist Gait Distance (Feet): 8 Feet Assistive device: Rolling walker (2 wheels) Gait Pattern/deviations: Step-to pattern, Decreased step length - right, Decreased step length - left, Trunk flexed       General Gait Details: Walked Pacetti distance in room, bed to recliner closer to window  [employer]     Tilt Bed    Modified Rankin (Stroke Patients Only)       Balance Overall balance assessment: Needs assistance Sitting-balance support: Bilateral upper extremity supported, Feet supported         Standing balance-Leahy Scale: Poor                               Pertinent Vitals/Pain Pain Assessment Pain Assessment: Faces Faces Pain Scale: No hurt Pain Intervention(s): Monitored during session  Home Living Family/patient expects to be discharged to:: Assisted living                 Home Equipment: Lift chair;Shower Counsellor (2 wheels) Additional Comments: Above info gleaned from chart review and needs to be verified; sounds like he has recently moved to ALF -- not exactly sure how much physical assist is available for pt    Prior Function Prior Level of Function : History of Falls  (last six months)             Mobility Comments: Using RW for mobility tasks, not moving as much as has been ADLs Comments: Per chart review, had HHAides, now at ALF     Extremity/Trunk Assessment   Upper Extremity Assessment Upper Extremity Assessment: Overall WFL for tasks assessed    Lower Extremity Assessment Lower Extremity Assessment: Generalized weakness       Communication   Communication Communication: Impaired Factors Affecting Communication: Hearing impaired;Other (comment) (Mobility Specialist placed hearing aides, though not sure how effective they are)    Cognition Arousal: Alert Behavior During Therapy: WFL for tasks assessed/performed   PT - Cognitive impairments: History of cognitive impairments, No family/caregiver present to determine baseline, Orientation, Memory, Attention, Difficult to assess Difficult to assess due to: Hard of hearing/deaf Orientation impairments: Person                   PT - Cognition Comments: disoriented to place, time, and situation Following commands: Intact       Cueing Cueing Techniques: Verbal cues, Tactile cues     General Comments General comments (skin integrity, edema, etc.): positioned in recliner, and lunch setup in front of her, chair alarm on    Exercises     Assessment/Plan    PT Assessment Patient needs continued PT services  PT Problem List Decreased strength;Decreased activity tolerance;Decreased balance;Decreased mobility;Decreased coordination;Decreased cognition;Decreased knowledge of use of DME;Decreased knowledge of precautions;Decreased safety awareness       PT Treatment Interventions DME instruction;Gait training;Stair training;Functional mobility training;Therapeutic activities;Therapeutic exercise;Balance training;Neuromuscular re-education;Cognitive remediation;Patient/family education    PT Goals (Current goals can be found in the Care Plan section)  Acute Rehab PT Goals Patient  Stated Goal: Reluctantly agreed to gettingup to eat breakfast PT Goal Formulation: Patient unable to participate in goal setting Time For Goal Achievement: 01/30/24 Potential to Achieve Goals: Good    Frequency Min 2X/week     Co-evaluation               AM-PAC PT "6 Clicks" Mobility  Outcome Measure Help needed turning from your back to your side while in a flat bed without using bedrails?: A Little Help needed moving from lying on your back to sitting on the side of a flat bed without using bedrails?: A Lot Help needed moving to and from a bed to a chair (including a wheelchair)?: A Little Help needed standing up from a chair using your arms (e.g., wheelchair or bedside chair)?: A Little Help needed to walk in hospital room?: A Little Help needed climbing 3-5 steps with a railing? : A Little 6 Click Score: 17    End of Session Equipment Utilized During Treatment: Gait belt Activity Tolerance: Patient tolerated treatment well Patient left: in chair;with call bell/phone within reach;with chair alarm set Nurse Communication: Mobility status PT Visit Diagnosis: Unsteadiness on feet (R26.81);Muscle weakness (generalized) (M62.81)    Time: 1610-9604 PT Time Calculation (min) (ACUTE ONLY): 12 min   Charges:  PT Evaluation $PT Eval Low Complexity: 1 Low   PT General Charges $$ ACUTE PT VISIT: 1 Visit         Darcus Eastern, PT  Acute Rehabilitation Services Office [phone] Secure Chat welcomed   [facility] Setting 01/16/2024, 1:45 PM

## 2024-01-16 NOTE — TOC Progression Note (Addendum)
 Transition of Care [facility]) - Progression Note    Patient Details  Name: Nicholas Burton MRN: [id number] Date of Birth: 25-Feb-1928  [facility] of [facility]) CM/SW Contact  Katrinka Parr, [place] Phone Number: 01/16/2024, 12:23 PM  Clinical Narrative:     Pt reported to be from [facility]; unclear what level of care(ALF vs Memory care.) PT eval pending. CSW called [facility] and left voicemail with their nurse requesting return call.    Social Determinants of Health (SDOH) Interventions SDOH Screenings   Food Insecurity: No Food Insecurity (07/19/2023)  Housing: Low Risk  (07/19/2023)  Transportation Needs: No Transportation Needs (07/19/2023)  Utilities: Not At Risk (07/19/2023)  Alcohol Screen: Low Risk  (07/13/2023)  Depression (PHQ2-9): Low Risk  (10/27/2022)  Financial Resource Strain: Low Risk  (07/13/2023)  Physical Activity: Sufficiently Active (07/13/2023)  Social Connections: Moderately Isolated (07/13/2023)  Stress: Patient Unable To Answer (07/13/2023)  Tobacco Use: Low Risk  (01/14/2024)  Health Literacy: Inadequate Health Literacy (07/13/2023)    Readmission Risk Interventions     No data to display

## 2024-01-16 NOTE — Progress Notes (Signed)
 PROGRESS NOTE    Nicholas Burton  CZY:[id number] DOB: 01-17-1928 DOA: 01/14/2024 PCP: Chanetta Comes, [facility]  Outpatient Specialists: oncology, cardiology    Brief Narrative:   From admission h and p  Nicholas Burton is a 89 y.o. male with history of diabetes mellitus type 2, metastatic prostate cancer, hypertension, CHF, dementia, BPH, chronic indwelling Foley catheter secondary to obstructive uropathy was brought to the ER after patient became acutely confused since this morning.  As per the patient's son patient was doing fine yesterday.  Usually ambulates with the help of a walker.  Since this morning patient was confused not himself.  Patient was recently placed on medication for overactive bladder.  Also was accidentally given 1 dose of Klonopin 1 mg 2 days ago.     Assessment & Plan:   Active Problems:   HLD (hyperlipidemia)   Essential hypertension   B12 deficiency   BPH (benign prostatic hyperplasia)   COPD with asthma (HCC)   CRI (chronic renal insufficiency), stage 3 (moderate)   Chronic diastolic CHF (congestive heart failure) (HCC)   Late onset Alzheimer's dementia without behavioral disturbance (HCC)   Prostate cancer metastatic to bone (HCC)   Insulin  dependent type 2 diabetes mellitus (HCC)   Chronic combined systolic and diastolic congestive heart failure (HCC)   Acute encephalopathy  # Acute encephalopathy # Dementia Possibly fro uti. Mri motion degraded but nothing acute. No focal symptoms other than family report of recent cough. Was accidentally given a benzodiazepine a couple of days ago. Also recent move to ALF with baseline dementia, this could be the delirium that can accompany dementia as it advanced. No significant lab abnormalities other than mildly elevated tsh, with normal free t4. B12 wnl. Respiratory swabs negative.  - PT consult pending, currently resides at assisted living facility  # Acute cystitis # Chronic foley As above. No fever or white  count, hemodynamically stable. Foley exchanged 5/19 - f/u cultures - continue ceftriaxone  for now  # T2DM Controlled - basal/bolus  # HFrecoveredEF Appears euvolemic - resume home torsemide  - will resume home potassium but at reduced dose of 20 daily to start (from tid) as patient was mildly hyperkalemic on presentation  # HTN Bp controlled - home metop, torsemide   # Hx metastatic prostate cancer # BPH Followed by onc - cont lupron , xtandi  - cont home flomax , myrbetriq   # CKD 3a stable   DVT prophylaxis: lovenox  Code Status: dnr/dni Family Communication: daughter-in-law sandra Dutt updated telephonically 5/20  Level of care: Telemetry Medical Status is: inpt    Consultants:  none  Procedures: none  Antimicrobials:  ceftriaxone     Subjective: No complaints. Confused. Calm overnight per rn, slept most of it  Objective: Vitals:   01/15/24 2024 01/16/24 0432 01/16/24 0755 01/16/24 0820  BP: 120/69 (!) 147/69 138/74 138/74  Pulse: 93 84 87 87  Resp: 17 17 18    Temp: (!) 97.4 F (36.3 C) 97.6 F (36.4 C) (!) 97.5 F (36.4 C)   TempSrc: Oral Oral    SpO2: 100% 98% 98%   Weight:      Height:        Intake/Output Summary (Last 24 hours) at 01/16/2024 1021 Last data filed at 01/16/2024 0809 Gross per 24 hour  Intake 600 ml  Output 3100 ml  Net -2500 ml   Filed Weights   01/14/24 2238  Weight: 74.8 kg    Examination:  General exam: Appears calm and comfortable  Respiratory system: Clear to auscultation. Respiratory effort  normal. Cardiovascular system: S1 & S2 heard, RRR.   Gastrointestinal system: Abdomen is nondistended, soft and nontender. Central nervous system: Alert, moving all 4 Extremities: Symmetric 5 x 5 power. Skin: No rashes, lesions or ulcers Psychiatry: confused, doesn't respond meaningfully    Data Reviewed: I have personally reviewed following labs and imaging studies  CBC: Recent Labs  Lab 01/14/24 1643 01/14/24 2233  01/15/24 0720  WBC 9.3 8.3 9.5  NEUTROABS 5.9  --   --   HGB 11.7* 12.0* 12.3*  HCT 37.4* 38.0* 39.1  MCV 92.1 91.6 91.4  PLT 189 194 179   Basic Metabolic Panel: Recent Labs  Lab 01/14/24 1643 01/14/24 2233 01/15/24 0720  NA 138 138 138  K 5.3* 4.5 4.4  CL 108 107 105  CO2 22 22 26   GLUCOSE 264* 183* 219*  BUN 19 18 17   CREATININE 1.45* 1.36* 1.36*  CALCIUM  8.9 8.7* 8.8*   GFR: Estimated Creatinine Clearance: 32.1 mL/min (A) (by C-G formula based on SCr of 1.36 mg/dL (H)). Liver Function Tests: Recent Labs  Lab 01/14/24 1643 01/15/24 0720  AST 16 16  ALT 14 14  ALKPHOS 116 128*  BILITOT 0.5 0.7  PROT 5.3* 5.3*  ALBUMIN 2.8* 2.6*   No results for input(s): "LIPASE", "AMYLASE" in the last 168 hours. Recent Labs  Lab 01/14/24 2233  AMMONIA 16   Coagulation Profile: No results for input(s): "INR", "PROTIME" in the last 168 hours. Cardiac Enzymes: No results for input(s): "CKTOTAL", "CKMB", "CKMBINDEX", "TROPONINI" in the last 168 hours. BNP (last 3 results) No results for input(s): "PROBNP" in the last 8760 hours. HbA1C: Recent Labs    01/14/24 2233  HGBA1C 6.9*   CBG: Recent Labs  Lab 01/15/24 0843 01/15/24 1219 01/15/24 1724 01/15/24 2017 01/16/24 0757  GLUCAP 204* 107* 177* 308* 108*   Lipid Profile: No results for input(s): "CHOL", "HDL", "LDLCALC", "TRIG", "CHOLHDL", "LDLDIRECT" in the last 72 hours. Thyroid  Function Tests: Recent Labs    01/14/24 2233 01/15/24 2135  TSH 6.238*  --   FREET4  --  1.03   Anemia Panel: Recent Labs    01/15/24 2135  VITAMINB12 356   Urine analysis:    Component Value Date/Time   COLORURINE YELLOW 01/14/2024 1643   APPEARANCEUR TURBID (A) 01/14/2024 1643   LABSPEC 1.008 01/14/2024 1643   PHURINE 5.0 01/14/2024 1643   GLUCOSEU >=500 (A) 01/14/2024 1643   GLUCOSEU NEGATIVE 07/26/2022 1406   HGBUR SMALL (A) 01/14/2024 1643   BILIRUBINUR NEGATIVE 01/14/2024 1643   KETONESUR NEGATIVE 01/14/2024 1643    PROTEINUR 30 (A) 01/14/2024 1643   UROBILINOGEN 0.2 07/26/2022 1406   NITRITE POSITIVE (A) 01/14/2024 1643   LEUKOCYTESUR LARGE (A) 01/14/2024 1643   Sepsis Labs: @LABRCNTIP (procalcitonin:4,lacticidven:4)  ) Recent Results (from the past 240 hours)  SARS Coronavirus 2 by RT PCR (hospital order, performed in [facility] hospital lab) *cepheid single result test* Anterior Nasal Swab     Status: None   Collection Time: 01/15/24  3:36 PM   Specimen: Anterior Nasal Swab  Result Value Ref Range Status   SARS Coronavirus 2 by RT PCR NEGATIVE NEGATIVE Final    Comment: Performed at [facility], [address], [place], [place] [address]  Respiratory (~20 pathogens) panel by PCR     Status: None   Collection Time: 01/15/24  3:36 PM   Specimen: Nasopharyngeal Swab; Respiratory  Result Value Ref Range Status   Adenovirus NOT DETECTED NOT DETECTED Final   Coronavirus 229E  NOT DETECTED NOT DETECTED Final    Comment: (NOTE) The Coronavirus on the Respiratory Panel, DOES NOT test for the novel  Coronavirus (2019 nCoV)    Coronavirus HKU1 NOT DETECTED NOT DETECTED Final   Coronavirus NL63 NOT DETECTED NOT DETECTED Final   Coronavirus OC43 NOT DETECTED NOT DETECTED Final   Metapneumovirus NOT DETECTED NOT DETECTED Final   Rhinovirus / Enterovirus NOT DETECTED NOT DETECTED Final   Influenza A NOT DETECTED NOT DETECTED Final   Influenza B NOT DETECTED NOT DETECTED Final   Parainfluenza Virus 1 NOT DETECTED NOT DETECTED Final   Parainfluenza Virus 2 NOT DETECTED NOT DETECTED Final   Parainfluenza Virus 3 NOT DETECTED NOT DETECTED Final   Parainfluenza Virus 4 NOT DETECTED NOT DETECTED Final   Respiratory Syncytial Virus NOT DETECTED NOT DETECTED Final   Bordetella pertussis NOT DETECTED NOT DETECTED Final   Bordetella Parapertussis NOT DETECTED NOT DETECTED Final   Chlamydophila pneumoniae NOT DETECTED NOT DETECTED Final   Mycoplasma pneumoniae NOT DETECTED NOT DETECTED Final     Comment: Performed at [facility], [address], [place], [place] [address]         Radiology Studies: DG CHEST PORT 1 VIEW Result Date: 01/15/2024 CLINICAL DATA:  Encephalopathy EXAM: PORTABLE CHEST 1 VIEW COMPARISON:  08/16/2023, 11/28/2022 FINDINGS: Similar blunting of left CP angle. Stable cardiomediastinal silhouette. No pneumothorax IMPRESSION: Similar blunting of left CP angle which may be due to small effusion or pleural thickening. Electronically Signed   By: Esmeralda Hedge M.D.   On: 01/15/2024 19:43   MR BRAIN WO CONTRAST Result Date: 01/15/2024 CLINICAL DATA:  89 year old male with altered mental status. Questionable abnormal left medial temporal lobe on CT. EXAM: MRI HEAD WITHOUT CONTRAST TECHNIQUE: Multiplanar, multiecho pulse sequences of the brain and surrounding structures were obtained without intravenous contrast. COMPARISON:  Head CT yesterday.  Brain MRI 02/28/2004. FINDINGS: The examination had to be discontinued prior to completion due to patient agitation. No axial FLAIR imaging obtained. Remaining study intermittently motion degraded. Brain: No restricted diffusion to suggest acute infarction. No midline shift, mass effect, evidence of mass lesion, ventriculomegaly, extra-axial collection or acute intracranial hemorrhage. Cervicomedullary junction and pituitary are within normal limits. Chronic giant arachnoid granulation of the left posterior fossa (series 5, image 10), normal variant), mildly larger since 2005. Patchy and symmetric, mild for age periventricular T2 hyperintensity. No definite cortical encephalomalacia, chronic cerebral blood products. Vascular: Major intracranial vascular flow voids are stable since 2005. Dominant left vertebral artery again noted. Skull and upper cervical spine: Background bone marrow signal is normal. Negative for age visible cervical spine. Sinuses/Orbits: Chronic right maxillary sinus disease. Postoperative changes to both globes.  Other: Trace mastoid air cell fluid, negative nasopharynx, appears to be inconsequential. IMPRESSION: Mildly truncated, motion degraded exam despite repeated imaging attempts. No acute intracranial abnormality identified. Electronically Signed   By: Marlise Simpers M.D.   On: 01/15/2024 13:13   CT Head Wo Contrast Result Date: 01/14/2024 CLINICAL DATA:  Altered mental status. EXAM: CT HEAD WITHOUT CONTRAST TECHNIQUE: Contiguous axial images were obtained from the base of the skull through the vertex without intravenous contrast. RADIATION DOSE REDUCTION: This exam was performed according to the departmental dose-optimization program which includes automated exposure control, adjustment of the mA and/or kV according to patient size and/or use of iterative reconstruction technique. COMPARISON:  07/18/2023 FINDINGS: Brain: Apparent decreased density in the inferior medial left temporal lobe, favored artifactual. No acute hemorrhage or subdural collection. Brain volume is  normal for age. Periventricular and deep white matter hypodensity, typical of chronic small vessel ischemia. No midline shift or mass lesion/mass effect. Vascular: Atherosclerosis of skullbase vasculature without hyperdense vessel or abnormal calcification. Skull: Lucent lesion within the left occipital bone was present on prior exam, although obscured by motion on that exam. This is been present since at least February 2024. Sinuses/Orbits: Chronic right maxillary sinus opacification. Other: None. IMPRESSION: 1. Apparent decreased density in the inferior medial left temporal lobe, favored artifactual. MRI could be considered if clinically indicated, however should only be considered if patient is able to hold still. 2. Chronic small vessel ischemia. 3. Lucent lesion within the left occipital bone was present on prior exam. This has been present since at least February 2024. This is nonspecific in etiology, however given history of metastatic prostate  cancer, cannot exclude metastatic disease. Electronically Signed   By: Chadwick Colonel M.D.   On: 01/14/2024 18:29        Scheduled Meds:  Chlorhexidine  Gluconate Cloth  6 each Topical Daily   colesevelam   1,875 mg Oral BID WC   cyanocobalamin   500 mcg Oral Daily   enoxaparin  (LOVENOX ) injection  40 mg Subcutaneous Q24H   enzalutamide   80 mg Oral Daily   insulin  aspart  0-9 Units Subcutaneous TID WC   insulin  glargine-yfgn  23 Units Subcutaneous QHS   magnesium  gluconate  500 mg Oral QHS   metoprolol  tartrate  12.5 mg Oral BID   mirabegron  ER  25 mg Oral Daily   multivitamin  1 tablet Oral BID   tamsulosin   0.4 mg Oral Daily   Continuous Infusions:  cefTRIAXone  (ROCEPHIN )  IV 1 g (01/16/24 0833)     LOS: 1 day     Raymonde Calico, MD [facility]   If 7PM-7AM, please contact night-coverage [email or web address] Password [email or web address] 01/16/2024, 10:21 AM

## 2024-01-17 DIAGNOSIS — G9340 Encephalopathy, unspecified: Secondary | ICD-10-CM | POA: Diagnosis not present

## 2024-01-17 LAB — GLUCOSE, CAPILLARY
Glucose-Capillary: 92 mg/dL (ref 70–99)
Glucose-Capillary: 66 mg/dL — ABNORMAL LOW (ref 70–99)
Glucose-Capillary: 169 mg/dL — ABNORMAL HIGH (ref 70–99)
Glucose-Capillary: 112 mg/dL — ABNORMAL HIGH (ref 70–99)
Glucose-Capillary: 269 mg/dL — ABNORMAL HIGH (ref 70–99)

## 2024-01-17 LAB — BASIC METABOLIC PANEL WITH GFR
Anion gap: 9 (ref 5–15)
BUN: 23 mg/dL (ref 8–23)
CO2: 24 mmol/L (ref 22–32)
Calcium: 8.8 mg/dL — ABNORMAL LOW (ref 8.9–10.3)
Chloride: 106 mmol/L (ref 98–111)
Creatinine, Ser: 1.45 mg/dL — ABNORMAL HIGH (ref 0.61–1.24)
GFR, Estimated: 45 mL/min — ABNORMAL LOW (ref >60)
Glucose, Bld: 91 mg/dL (ref 70–99)
Potassium: 3.8 mmol/L (ref 3.5–5.1)
Sodium: 139 mmol/L (ref 135–145)

## 2024-01-17 MED ORDER — INSULIN GLARGINE-YFGN 100 UNIT/ML ~~LOC~~ SOLN
20.0000 [IU] | Freq: Every day | SUBCUTANEOUS | Status: DC
  Administered 2024-01-17: 20 [IU] via SUBCUTANEOUS
  Filled 2024-01-17 (×2): qty 0.2

## 2024-01-17 NOTE — Plan of Care (Signed)
  Problem: Activity: Goal: Risk for activity intolerance will decrease Outcome: Progressing   Problem: Pain Managment: Goal: General experience of comfort will improve and/or be controlled Outcome: Progressing   Problem: Safety: Goal: Ability to remain free from injury will improve Outcome: Progressing   Problem: Skin Integrity: Goal: Risk for impaired skin integrity will decrease Outcome: Progressing   Problem: Fluid Volume: Goal: Ability to maintain a balanced intake and output will improve Outcome: Progressing   Problem: Nutritional: Goal: Maintenance of adequate nutrition will improve Outcome: Progressing   Problem: Skin Integrity: Goal: Risk for impaired skin integrity will decrease Outcome: Progressing   Problem: Tissue Perfusion: Goal: Adequacy of tissue perfusion will improve Outcome: Progressing

## 2024-01-17 NOTE — TOC CM/SW Note (Signed)
 Asked to arrange HHPT/OT with [facility] . Nicholas Burton with Nicholas Burton accepted referral. NCM entered orders and secure chatted MD to sign

## 2024-01-17 NOTE — Evaluation (Signed)
 Occupational Therapy Evaluation Patient Details Name: Nicholas Burton MRN: [id number] DOB: 04-26-1928 Today's Date: 01/17/2024   History of Present Illness   Pt is a 89 y.o. male presented to ED 5/18 for AMS.  Pt was recently placed on medication for overactive bladder.  Also was accidentally given 1 dose of Klonopin 1 mg 2 days PTA;  PMH: DM, metastatic prostate cancer, hypertension, CHF, dementia, BPH, chronic indwelling Foley catheter secondary to obstructive uropathy     Clinical Impressions Pt admitted based on above, and was seen based on problem list below. PTA pt was independent with ADLs at his ALF. Today pt is requiring set up  to CGA for ADLs. Bed mobility and functional transfers are  CGA. Pt presenting with decreased cog, requiring cues for sequencing. Recommendation of HHOT at ALF with adequate supervision for safety. OT will continue to follow acutely to maximize functional independence.        If plan is discharge home, recommend the following:   A little help with walking and/or transfers;A little help with bathing/dressing/bathroom;Supervision due to cognitive status     Functional Status Assessment   Patient has had a recent decline in their functional status and demonstrates the ability to make significant improvements in function in a reasonable and predictable amount of time.     Equipment Recommendations   None recommended by OT     Recommendations for Other Services         Precautions/Restrictions   Precautions Precautions: Fall Recall of Precautions/Restrictions: Impaired Restrictions Weight Bearing Restrictions Per Provider Order: No     Mobility Bed Mobility Overal bed mobility: Needs Assistance Bed Mobility: Supine to Sit     Supine to sit: Supervision     General bed mobility comments: No physical assist    Transfers Overall transfer level: Needs assistance Equipment used: Rolling walker (2 wheels) Transfers: Sit to/from  Stand, Bed to chair/wheelchair/BSC Sit to Stand: Contact guard assist     Step pivot transfers: Contact guard assist     General transfer comment: CGA for balance, cues for safety with use of RW      Balance Overall balance assessment: Needs assistance Sitting-balance support: Bilateral upper extremity supported, Feet supported Sitting balance-Leahy Scale: Fair     Standing balance support: Bilateral upper extremity supported, During functional activity, Reliant on assistive device for balance Standing balance-Leahy Scale: Poor Standing balance comment: Reliant on RW           ADL either performed or assessed with clinical judgement   ADL Overall ADL's : Needs assistance/impaired Eating/Feeding: Set up;Sitting   Grooming: Set up;Sitting           Upper Body Dressing : Set up;Sitting   Lower Body Dressing: Sit to/from stand;Contact guard assist Lower Body Dressing Details (indicate cue type and reason): CGA for balance, able to figure 4 for socks Toilet Transfer: Contact guard assist;Ambulation;Rolling walker (2 wheels) Toilet Transfer Details (indicate cue type and reason): Simulated in room         Functional mobility during ADLs: Contact guard assist;Rolling walker (2 wheels)       Vision Baseline Vision/History: 1 Wears glasses Vision Assessment?: No apparent visual deficits            Pertinent Vitals/Pain Pain Assessment Pain Assessment: No/denies pain Pain Intervention(s): Monitored during session     Extremity/Trunk Assessment Upper Extremity Assessment Upper Extremity Assessment: Generalized weakness   Lower Extremity Assessment Lower Extremity Assessment: Defer to PT evaluation  Communication Communication Communication: Impaired Factors Affecting Communication: Hearing impaired   Cognition Arousal: Alert Behavior During Therapy: WFL for tasks assessed/performed Cognition: History of cognitive impairments     OT - Cognition  Comments: Difficulty following simple commands, little verbal answers       Following commands: Impaired Following commands impaired: Follows one step commands with increased time, Follows one step commands inconsistently     Cueing  General Comments   Cueing Techniques: Visual cues;Verbal cues;Tactile cues  Caregiver present during session           Home Living Family/patient expects to be discharged to:: Assisted living       Home Equipment: Lift chair;Shower Counsellor (2 wheels)          Prior Functioning/Environment Prior Level of Function : Needs assist;History of Falls (last six months)       Mobility Comments: Use of RW ADLs Comments: Caregiver present MWF    OT Problem List: Decreased strength;Decreased range of motion;Decreased activity tolerance;Impaired balance (sitting and/or standing);Decreased cognition;Decreased safety awareness   OT Treatment/Interventions: Self-care/ADL training;Therapeutic exercise;Energy conservation;DME and/or AE instruction;Therapeutic activities;Patient/family education;Balance training      OT Goals(Current goals can be found in the care plan section)   Acute Rehab OT Goals Patient Stated Goal: None stated OT Goal Formulation: Patient unable to participate in goal setting Time For Goal Achievement: 01/31/24 Potential to Achieve Goals: Good   OT Frequency:  Min 2X/week       AM-PAC OT "6 Clicks" Daily Activity     Outcome Measure Help from another person eating meals?: None Help from another person taking care of personal grooming?: A Little Help from another person toileting, which includes using toliet, bedpan, or urinal?: A Little Help from another person bathing (including washing, rinsing, drying)?: A Little Help from another person to put on and taking off regular upper body clothing?: A Little Help from another person to put on and taking off regular lower body clothing?: A Little 6 Click Score:  19   End of Session Equipment Utilized During Treatment: Gait belt;Rolling walker (2 wheels) Nurse Communication: Mobility status  Activity Tolerance: Patient tolerated treatment well Patient left: in chair;with call bell/phone within reach;with chair alarm set;with family/visitor present  OT Visit Diagnosis: Unsteadiness on feet (R26.81);Other abnormalities of gait and mobility (R26.89)                Time: 1610-9604 OT Time Calculation (min): 13 min Charges:  OT General Charges $OT Visit: 1 Visit OT Evaluation $OT Eval Low Complexity: 1 Low  Nicholas Burton, OT  Acute Rehabilitation Services Office [phone] Secure chat preferred   Nicholas Burton 01/17/2024, 1:04 PM

## 2024-01-17 NOTE — Care Management Important Message (Signed)
 Important Message  Patient Details  Name: Nicholas Burton MRN: [id number] Date of Birth: 10/29/1927   Important Message Given:  Yes - Medicare IM     Felix Host 01/17/2024, 1:56 PM

## 2024-01-17 NOTE — Inpatient Diabetes Management (Addendum)
 Inpatient Diabetes Program Recommendations  AACE/ADA: New Consensus Statement on Inpatient Glycemic Control   Target Ranges:  Prepandial:   less than 140 mg/dL      Peak postprandial:   less than 180 mg/dL (1-2 hours)      Critically ill patients:  140 - 180 mg/dL    Latest Reference Range & Units 01/16/24 07:57 01/16/24 11:49 01/16/24 16:07 01/16/24 21:14 01/17/24 07:53 01/17/24 11:33  Glucose-Capillary 70 - 99 mg/dL 829 (H) 562 (H) 130 (H) 202 (H) 92 66 (L)   Review of Glycemic Control  Diabetes history: DM2 Outpatient Diabetes medications: Lantus  23 units at bedtime Current orders for Inpatient glycemic control: Semglee  23 units at bedtime, Novolog  0-9 units TID with meals  Inpatient Diabetes Program Recommendations:    Insulin : Fasting CBG 92 this am and then 66 mg/dl at 86:57 today.  Please consider decreasing Semglee  to 20  units at bedtime.  Thanks, Beacher Limerick, RN, MSN, CDCES Diabetes Coordinator Inpatient Diabetes Program [phone] (Team Pager from 8am to 5pm)

## 2024-01-17 NOTE — TOC Progression Note (Addendum)
 Transition of Care [facility]) - Progression Note    Patient Details  Name: Nicholas Burton MRN: [id number] Date of Birth: 25-Apr-1928  Transition of Care [facility]) CM/SW Contact  Katrinka Parr, [place] Phone Number: 01/17/2024, 10:06 AM  Clinical Narrative:     CSW called Guillermina Leer to discuss pt's anticipated DC tomorrow; was given nursing directors contact#- [phone]- Melissa. CSW called and left voicemail with [facility] requesting return call.   1225: Received return call from [facility]. CSW provided update and discussed pt's PT session/recs. Melissa state that pt is a newer resident there and that he is just a little bit off his baseline. They would be fine for pt to return there with [facility] PT. They are contracted with [facility]. Pt is at Memory care level of care .  Melissa can be called for report at [phone] DC summmary and fl2 can be faxed to [phone]          Social Determinants of Health (SDOH) Interventions SDOH Screenings   Food Insecurity: No Food Insecurity (07/19/2023)  Housing: Low Risk  (07/19/2023)  Transportation Needs: No Transportation Needs (07/19/2023)  Utilities: Not At Risk (07/19/2023)  Alcohol Screen: Low Risk  (07/13/2023)  Depression (PHQ2-9): Low Risk  (10/27/2022)  Financial Resource Strain: Low Risk  (07/13/2023)  Physical Activity: Sufficiently Active (07/13/2023)  Social Connections: Moderately Isolated (07/13/2023)  Stress: Patient Unable To Answer (07/13/2023)  Tobacco Use: Low Risk  (01/14/2024)  Health Literacy: Inadequate Health Literacy (07/13/2023)    Readmission Risk Interventions     No data to display

## 2024-01-17 NOTE — Progress Notes (Signed)
 PROGRESS NOTE    [name]  KGM:[id number] DOB: June 26, 1928 DOA: 01/14/2024 PCP: [facility]    Brief Narrative:   Nicholas Burton is a 89 y.o. male with past medical history significant for DM2, HTN, chronic diastolic CHF, BPH with chronic indwelling Foley catheter secondary to obstructive uropathy, dementia who presented to [facility] ED on 01/14/2024 from [facility] memory care unit with confusion.  Onset day/morning of ED arrival, was doing fine previous day per son but not acting like himself.  At baseline ambulates with the help of a walker.  Recently placed on medication for overactive bladder and also was apparently given accidental dose of Klonopin 2 days prior.  In the ED, temperature 98.3 F, HR 94, RR 20, BP 09/21/1969, SpO2 100% on room air.  WBC 9.3, hemoglobin 11.7, platelet count 189.  Sodium 138, potassium 5.3, chloride 108, CO2 22, glucose 264, BUN 19, creat 1.45.  AST 16, ALT 14, total bilirubin 0.5.  Urinalysis with large leukocytes, positive nitrite, many bacteria, greater than 50 WBCs.  UDS negative.  CT head without contrast apparent decreased density inferior medial left temporal lobe favored artifactual, chronic small vessel ischemia, lucent lesion left occipital bone present on prior exam nonspecific but given history of metastatic prostate cancer cannot exclude metastatic disease.   Assessment & Plan:   Acute toxic/metabolic encephalopathy E. coli urinary tract infection Patient presenting with confusion, likely multifactorial in the setting of receiving accidental 1 dose of Klonopin 2 days prior as well as urinary tract infection.  MR brain without contrast that was motion degraded but no acute intracranial abnormality identified.  Urinalysis consistent with infection with urine culture positive for E. coli.  UDS negative.  TSH elevated 6.238 but normal free T4.  Vitamin B12 within normal limits, ammonia level 16. -- Foley catheter exchange 5/19 -- Urine  culture: + > 100K Ecoli; susceptibilities pending -- Ceftriaxone  1 g IV every 24 hours  Chronic diastolic congestive heart failure, compensated Essential hypertension TTE 11/30/2022 with LVEF 50-55%, moderate asymmetric LVH, grade 1 diastolic dysfunction. -- Metoprolol  tartrate 12.5 mg p.o. twice daily -- Torsemide  10 mg p.o. every other day  Type 2 diabetes mellitus Hemoglobin A1c 6.9.  Home regimen includes Lantus  23 units [place] daily,  -- Semglee  20 units [place] at bedtime -- Sensitive SSI for coverage -- CBG before every meal/at bedtime  BPH/obstructive uropathy s/p chronic indwelling Foley catheter -- Tamsulosin  0.4 mg p.o. daily -- Foley catheter exchange 5/19 -- Outpatient follow-up with PCP/urology  Metastatic prostate cancer CT head without contrast on admission with lucent lesion left occipital bone, present on prior exam but given history of metastatic prostate cancer cannot exclude metastatic lesion. -- Xtandi  80 mg PO daily  Dementia --Delirium precautions --Get up during the day --Encourage a familiar face to remain present throughout the day --Keep blinds open and lights on during daylight hours --Minimize the use of opioids/benzodiazepines  DVT prophylaxis: enoxaparin  (LOVENOX ) injection 40 mg Start: 01/16/24 1000    Code Status: Limited: Do not attempt resuscitation (DNR) -DNR-LIMITED -Do Not Intubate/DNI  Family Communication:   Disposition Plan:  Level of care: Telemetry Medical Status is: Inpatient Remains inpatient appropriate because: IV antibiotics    Consultants:  None  Procedures:  None  Antimicrobials:  Ceftriaxone  5/18>>   Subjective: Patient seen examined at bedside.  Pleasantly confused, lying in bed.  Reports "did not get a lot of sleep overnight".  No family present.  No other questions or concerns at this time.  Remains on IV antibiotics, urine culture positive for E. coli, awaiting susceptibilities.  Mentation appears to be now close to his  baseline.  Denies headache, no chest pain, no shortness of breath, no abdominal pain, no fever, no nausea/vomiting/diarrhea.  No acute events overnight per nursing staff.  Objective: Vitals:   01/16/24 2117 01/17/24 0500 01/17/24 0754 01/17/24 1106  BP: 128/71 139/77 138/77 138/77  Pulse: 95 89 85 85  Resp: 18 17 17    Temp: 97.7 F (36.5 C) 97.7 F (36.5 C) 97.8 F (36.6 C)   TempSrc: Oral Oral Oral   SpO2: 100% 96% 97%   Weight:      Height:        Intake/Output Summary (Last 24 hours) at 01/17/2024 1626 Last data filed at 01/17/2024 1455 Gross per 24 hour  Intake 718 ml  Output 1676 ml  Net -958 ml   Filed Weights   01/14/24 2238  Weight: 74.8 kg    Examination:  Physical Exam: GEN: NAD, alert, pleasantly confused, elderly in appearance HEENT: NCAT, PERRL, EOMI, sclera clear, MMM PULM: CTAB w/o wheezes/crackles, normal respiratory effort, on room air CV: RRR w/o M/G/R GI: abd soft, NTND, NABS, no R/G/M GU: Foley catheter noted with clear lower urine in collection bag MSK: no peripheral edema, moves all remedies independently NEURO: CN II-XII intact, no focal deficits, sensation to light touch intact PSYCH: normal mood/affect Integumentary: dry/intact, no rashes or wounds    Data Reviewed: I have personally reviewed following labs and imaging studies  CBC: Recent Labs  Lab 01/14/24 1643 01/14/24 2233 01/15/24 0720  WBC 9.3 8.3 9.5  NEUTROABS 5.9  --   --   HGB 11.7* 12.0* 12.3*  HCT 37.4* 38.0* 39.1  MCV 92.1 91.6 91.4  PLT 189 194 179   Basic Metabolic Panel: Recent Labs  Lab 01/14/24 1643 01/14/24 2233 01/15/24 0720 01/17/24 0614  NA 138 138 138 139  K 5.3* 4.5 4.4 3.8  CL 108 107 105 106  CO2 22 22 26 24   GLUCOSE 264* 183* 219* 91  BUN 19 18 17 23   CREATININE 1.45* 1.36* 1.36* 1.45*  CALCIUM  8.9 8.7* 8.8* 8.8*   GFR: Estimated Creatinine Clearance: 30.1 mL/min (A) (by C-G formula based on SCr of 1.45 mg/dL (H)). Liver Function  Tests: Recent Labs  Lab 01/14/24 1643 01/15/24 0720  AST 16 16  ALT 14 14  ALKPHOS 116 128*  BILITOT 0.5 0.7  PROT 5.3* 5.3*  ALBUMIN 2.8* 2.6*   No results for input(s): "LIPASE", "AMYLASE" in the last 168 hours. Recent Labs  Lab 01/14/24 2233  AMMONIA 16   Coagulation Profile: No results for input(s): "INR", "PROTIME" in the last 168 hours. Cardiac Enzymes: No results for input(s): "CKTOTAL", "CKMB", "CKMBINDEX", "TROPONINI" in the last 168 hours. BNP (last 3 results) No results for input(s): "PROBNP" in the last 8760 hours. HbA1C: Recent Labs    01/14/24 2233  HGBA1C 6.9*   CBG: Recent Labs  Lab 01/16/24 1607 01/16/24 2114 01/17/24 0753 01/17/24 1133 01/17/24 1304  GLUCAP 187* 202* 92 66* 169*   Lipid Profile: No results for input(s): "CHOL", "HDL", "LDLCALC", "TRIG", "CHOLHDL", "LDLDIRECT" in the last 72 hours. Thyroid  Function Tests: Recent Labs    01/14/24 2233 01/15/24 2135  TSH 6.238*  --   FREET4  --  1.03   Anemia Panel: Recent Labs    01/15/24 2135  VITAMINB12 356   Sepsis Labs: No results for input(s): "PROCALCITON", "LATICACIDVEN" in the last 168 hours.  Recent Results (from the past 240 hours)  Urine Culture     Status: Abnormal (Preliminary result)   Collection Time: 01/14/24  4:43 PM   Specimen: Urine, Random  Result Value Ref Range Status   Specimen Description URINE, RANDOM  Final   Special Requests NONE Reflexed from [id number]  Final   Culture (A)  Final    >=100,000 COLONIES/mL ESCHERICHIA COLI SUSCEPTIBILITIES TO FOLLOW Performed at [facility], [address] [place], [place] [address]    Report Status PENDING  Incomplete  SARS Coronavirus 2 by RT PCR (hospital order, performed in [facility] hospital lab) *cepheid single result test* Anterior Nasal Swab     Status: None   Collection Time: 01/15/24  3:36 PM   Specimen: Anterior Nasal Swab  Result Value Ref Range Status   SARS Coronavirus 2 by RT PCR NEGATIVE  NEGATIVE Final    Comment: Performed at [facility], [address], [place], [place] [address]  Respiratory (~20 pathogens) panel by PCR     Status: None   Collection Time: 01/15/24  3:36 PM   Specimen: Nasopharyngeal Swab; Respiratory  Result Value Ref Range Status   Adenovirus NOT DETECTED NOT DETECTED Final   Coronavirus 229E NOT DETECTED NOT DETECTED Final    Comment: (NOTE) The Coronavirus on the Respiratory Panel, DOES NOT test for the novel  Coronavirus (2019 nCoV)    Coronavirus HKU1 NOT DETECTED NOT DETECTED Final   Coronavirus NL63 NOT DETECTED NOT DETECTED Final   Coronavirus OC43 NOT DETECTED NOT DETECTED Final   Metapneumovirus NOT DETECTED NOT DETECTED Final   Rhinovirus / Enterovirus NOT DETECTED NOT DETECTED Final   Influenza A NOT DETECTED NOT DETECTED Final   Influenza B NOT DETECTED NOT DETECTED Final   Parainfluenza Virus 1 NOT DETECTED NOT DETECTED Final   Parainfluenza Virus 2 NOT DETECTED NOT DETECTED Final   Parainfluenza Virus 3 NOT DETECTED NOT DETECTED Final   Parainfluenza Virus 4 NOT DETECTED NOT DETECTED Final   Respiratory Syncytial Virus NOT DETECTED NOT DETECTED Final   Bordetella pertussis NOT DETECTED NOT DETECTED Final   Bordetella Parapertussis NOT DETECTED NOT DETECTED Final   Chlamydophila pneumoniae NOT DETECTED NOT DETECTED Final   Mycoplasma pneumoniae NOT DETECTED NOT DETECTED Final    Comment: Performed at [facility], [address] [place], [place] [address]  Culture, blood (Routine X 2) w Reflex to ID Panel     Status: None (Preliminary result)   Collection Time: 01/15/24  4:52 PM   Specimen: BLOOD RIGHT ARM  Result Value Ref Range Status   Specimen Description BLOOD RIGHT ARM  Final   Special Requests   Final    BOTTLES DRAWN AEROBIC ONLY Blood Culture results may not be optimal due to an inadequate volume of blood received in culture bottles   Culture   Final    NO GROWTH 2 DAYS Performed at [facility]  Lab, [address], [place], [place] [address]    Report Status PENDING  Incomplete  Culture, blood (Routine X 2) w Reflex to ID Panel     Status: None (Preliminary result)   Collection Time: 01/15/24  5:03 PM   Specimen: BLOOD RIGHT HAND  Result Value Ref Range Status   Specimen Description BLOOD RIGHT HAND  Final   Special Requests   Final    BOTTLES DRAWN AEROBIC ONLY Blood Culture results may not be optimal due to an inadequate volume of blood received in  culture bottles   Culture   Final    NO GROWTH 2 DAYS Performed at [facility], [address], [place], [place] [address]    Report Status PENDING  Incomplete         Radiology Studies: No results found.      Scheduled Meds:  Chlorhexidine  Gluconate Cloth  6 each Topical Daily   colesevelam   1,875 mg Oral BID WC   cyanocobalamin   500 mcg Oral Daily   enoxaparin  (LOVENOX ) injection  40 mg Subcutaneous Q24H   enzalutamide   80 mg Oral Daily   guaiFENesin   600 mg Oral BID   insulin  aspart  0-9 Units Subcutaneous TID WC   insulin  glargine-yfgn  20 Units Subcutaneous QHS   magnesium  gluconate  500 mg Oral QHS   metoprolol  tartrate  12.5 mg Oral BID   mirabegron  ER  25 mg Oral Daily   multivitamin  1 tablet Oral BID   polyethylene glycol  17 g Oral Daily   potassium chloride  SA   Oral Daily   tamsulosin   0.4 mg Oral Daily   torsemide   10 mg Oral QODAY   Continuous Infusions:  cefTRIAXone  (ROCEPHIN )  IV 1 g (01/17/24 1113)     LOS: 2 days    Time spent: 52 minutes spent on 01/17/2024 caring for this patient face-to-face including chart review, ordering labs/tests, documenting, discussion with nursing staff, consultants, updating family and interview/physical exam    [facility] [place], DO [facility] Available via Epic secure chat 7am-7pm After these hours, please refer to coverage provider listed on [email or web address] 01/17/2024, 4:26 PM

## 2024-01-18 DIAGNOSIS — G9340 Encephalopathy, unspecified: Secondary | ICD-10-CM | POA: Diagnosis not present

## 2024-01-18 LAB — BASIC METABOLIC PANEL WITH GFR
Anion gap: 10 (ref 5–15)
BUN: 22 mg/dL (ref 8–23)
CO2: 20 mmol/L — ABNORMAL LOW (ref 22–32)
Calcium: 8.8 mg/dL — ABNORMAL LOW (ref 8.9–10.3)
Chloride: 108 mmol/L (ref 98–111)
Creatinine, Ser: 1.45 mg/dL — ABNORMAL HIGH (ref 0.61–1.24)
GFR, Estimated: 45 mL/min — ABNORMAL LOW (ref >60)
Glucose, Bld: 173 mg/dL — ABNORMAL HIGH (ref 70–99)
Potassium: 4 mmol/L (ref 3.5–5.1)
Sodium: 138 mmol/L (ref 135–145)

## 2024-01-18 LAB — URINE CULTURE: Culture: >=100000 — AB

## 2024-01-18 LAB — GLUCOSE, CAPILLARY: Glucose-Capillary: 271 mg/dL — ABNORMAL HIGH (ref 70–99)

## 2024-01-18 MED ORDER — CEPHALEXIN 500 MG PO CAPS: 500.0000 mg | ORAL_CAPSULE | Freq: Two times a day (BID) | ORAL | 0 refills | Status: AC

## 2024-01-18 NOTE — TOC Transition Note (Signed)
 Transition of Care [facility]) - Discharge Note   Patient Details  Name: Nicholas Burton MRN: [id number] Date of Birth: [birth year]  [facility] CM/SW Contact:  Jeffory Mings, LCSW Phone Number: 01/18/2024, 9:58 AM   Clinical Narrative: Pt for dc back to [facility] today. Spoke to [place] [facility] [phone] who confirmed pt able to return. Per Lovett Ruck they typically use [facility] for [facility] but pt's wife prefers [facility]. Referral made to Artavia with [facility] for HHPT/OT and pt has been accepted for services with [facility] planned for tomorrow. DC summary faxed to [facility]; confirmed with Melissa no FL2 needed. RN provided with number for report and PTAR arranged for transport. Pt's son Dovie Gell notified of dc, unable to reach pt's wife. SW signing off at dc.    Paullette Boston, MSW, LCSW [phone] (coverage)        Final next level of care: Assisted Living Barriers to Discharge: Barriers Resolved   Patient Goals and CMS Choice            Discharge Placement                Patient to be transferred to facility by: PT[facility] Name of family member notified: Curtis/son Patient and family notified of of transfer: 01/18/24  Discharge Plan and Services Additional resources added to the After Visit Summary for                                       Social Drivers of Health (SDOH) Interventions SDOH Screenings   Food Insecurity: No Food Insecurity (07/19/2023)  Housing: Low Risk  (07/19/2023)  Transportation Needs: No Transportation Needs (07/19/2023)  Utilities: Not At Risk (07/19/2023)  Alcohol Screen: Low Risk  (07/13/2023)  Depression (PHQ2-9): Low Risk  (10/27/2022)  Financial Resource Strain: Low Risk  (07/13/2023)  Physical Activity: Sufficiently Active (07/13/2023)  Social Connections: Moderately Isolated (07/13/2023)  Stress: Patient Unable To Answer (07/13/2023)  Tobacco Use: Low Risk  (01/14/2024)  Health Literacy:  Inadequate Health Literacy (07/13/2023)     Readmission Risk Interventions     No data to display

## 2024-01-18 NOTE — Discharge Summary (Addendum)
 Physician Discharge Summary  Nicholas Burton GNF:[id number] DOB: 04/09/1928 DOA: 01/14/2024  PCP: [facility]  Admit date: 01/14/2024 Discharge date: 01/18/2024  Admitted From: [facility] Disposition: [facility]  Recommendations for Outpatient Follow-up:  Follow up with PCP in 1-2 weeks Outpatient follow-up with palliative care Continue Keflex to complete 7-day course for E. coli UTI  Home Health: PT/OT Equipment/Devices: Foley, exchanged on 01/15/2024  Discharge Condition: Stable CODE STATUS: DNR Diet recommendation: Heart healthy/consistent carbohydrate diet  History of present illness:  Nicholas Burton is a 89 y.o. male with past medical history significant for DM2, HTN, chronic diastolic CHF, BPH with chronic indwelling Foley catheter secondary to obstructive uropathy, dementia who presented to [facility] ED on 01/14/2024 from [facility] memory care unit with confusion.  Onset day/morning of ED arrival, was doing fine previous day per son but not acting like himself.  At baseline ambulates with the help of a walker.  Recently placed on medication for overactive bladder and also was apparently given accidental dose of Klonopin 2 days prior.   In the ED, temperature 98.3 F, HR 94, RR 20, BP 09/21/1969, SpO2 100% on room air.  WBC 9.3, hemoglobin 11.7, platelet count 189.  Sodium 138, potassium 5.3, chloride 108, CO2 22, glucose 264, BUN 19, creat 1.45.  AST 16, ALT 14, total bilirubin 0.5.  Urinalysis with large leukocytes, positive nitrite, many bacteria, greater than 50 WBCs.  UDS negative.  CT head without contrast apparent decreased density inferior medial left temporal lobe favored artifactual, chronic small vessel ischemia, lucent lesion left occipital bone present on prior exam nonspecific but given history of metastatic prostate cancer cannot exclude metastatic disease.  Hospital course:  Acute toxic/metabolic  encephalopathy E. coli urinary tract infection Patient presenting with confusion, likely multifactorial in the setting of receiving accidental 1 dose of Klonopin 2 days prior as well as urinary tract infection.  MR brain without contrast that was motion degraded but no acute intracranial abnormality identified.  Urinalysis consistent with infection with urine culture positive for E. coli.  UDS negative.  TSH elevated 6.238 but normal free T4.  Vitamin B12 within normal limits, ammonia level 16. Foley catheter exchange 5/19.  Urine culture with 100K Ecoli; pan susceptible.  Initially started on IV ceftriaxone  and will transition to Keflex to complete 7-day course.   Chronic diastolic congestive heart failure, compensated Essential hypertension TTE 11/30/2022 with LVEF 50-55%, moderate asymmetric LVH, grade 1 diastolic dysfunction. Metoprolol  tartrate 12.5 mg p.o. twice daily. Torsemide  10 mg p.o. every other day   Type 2 diabetes mellitus Hemoglobin A1c 6.9.  Home regimen includes Lantus  23 units [place] daily,    BPH/obstructive uropathy s/p chronic indwelling Foley catheter Tamsulosin  0.4 mg p.o. daily. Foley catheter exchanged 5/19. Outpatient follow-up with PCP/urology   Metastatic prostate cancer CT head without contrast on admission with lucent lesion left occipital bone, present on prior exam but given history of metastatic prostate cancer cannot exclude metastatic lesion. Xtandi  80 mg PO daily   Dementia Delirium precautions     Discharge Diagnoses:  Principal Problem:   Acute encephalopathy Active Problems:   HLD (hyperlipidemia)   Essential hypertension   B12 deficiency   BPH (benign prostatic hyperplasia)   COPD with asthma (HCC)   CRI (chronic renal insufficiency), stage 3 (moderate)   Chronic diastolic CHF (congestive heart failure) (HCC)   Late onset Alzheimer's dementia without behavioral disturbance (HCC)   Prostate cancer metastatic to bone (HCC)  Insulin  dependent type 2  diabetes mellitus (HCC)   Chronic combined systolic and diastolic congestive heart failure [facility])    Discharge Instructions  Discharge Instructions     Call MD for:  difficulty breathing, headache or visual disturbances   Complete by: As directed    Call MD for:  extreme fatigue   Complete by: As directed    Call MD for:  persistant dizziness or light-headedness   Complete by: As directed    Call MD for:  persistant nausea and vomiting   Complete by: As directed    Call MD for:  severe uncontrolled pain   Complete by: As directed    Call MD for:  temperature >100.4   Complete by: As directed    Diet - low sodium heart healthy   Complete by: As directed    Increase activity slowly   Complete by: As directed       Allergies as of 01/18/2024       Reactions   Ace Inhibitors Cough   Oxycodone Itching, Rash   Penicillins Itching, Rash        Medication List     TAKE these medications    acetaminophen  325 MG tablet Commonly known as: TYLENOL  Take 1-2 tablets (325-650 mg total) by mouth every 6 (six) hours as needed for mild pain.   cephALEXin 500 MG capsule Commonly known as: KEFLEX Take 1 capsule (500 mg total) by mouth 2 (two) times daily for 3 days.   cholecalciferol  1000 units tablet Commonly known as: VITAMIN D  Take 1 tablet (1,000 Units total) by mouth daily.   Colesevelam  HCl 3.75 g Pack Take 1 packet by mouth daily.   cyanocobalamin  500 MCG tablet Commonly known as: VITAMIN B12 Take 1 tablet (500 mcg total) by mouth daily.   enzalutamide  80 MG tablet Commonly known as: Xtandi  Take 1 tablet (80 mg total) by mouth daily.   Gemtesa  75 MG Tabs Generic drug: Vibegron  Take 1 tablet (75 mg total) by mouth daily.   Lantus  SoloStar 100 UNIT/ML Solostar Pen Generic drug: insulin  glargine ADMINISTER 23 UNITS UNDER THE SKIN AT BEDTIME What changed: See the new instructions.   magnesium  gluconate 500 MG tablet Commonly known as: MAGONATE Take 1 tablet  (500 mg total) by mouth at bedtime.   melatonin 3 MG Tabs tablet Take 1 tablet (3 mg total) by mouth at bedtime as needed.   metoprolol  tartrate 25 MG tablet Commonly known as: LOPRESSOR  TAKE 1/2 TABLET BY MOUTH TWICE DAILY   MULTIVITAL PO Take 1 tablet by mouth 2 (two) times daily.   Potassium Chloride  ER 20 MEQ Tbcr Take 1 tablet by mouth 3 (three) times daily.   tamsulosin  0.4 MG Caps capsule Commonly known as: FLOMAX  Take 1 capsule (0.4 mg total) by mouth daily.   torsemide  10 MG tablet Commonly known as: DEMADEX  Take 1 tablet (10 mg total) by mouth every other day.        Follow-up Information     [facility] Follow up.   Specialty: Home Health Services Contact information: [address] [place] [address] [phone]         [facility]. Schedule an appointment as soon as possible for a visit in 1 week(s).   Contact information: [address] [place] [address] [phone]                Allergies  Allergen Reactions   Ace Inhibitors Cough   Oxycodone Itching and  Rash   Penicillins Itching and Rash    Consultations: None   Procedures/Studies: DG CHEST PORT 1 VIEW Result Date: 01/15/2024 CLINICAL DATA:  Encephalopathy EXAM: PORTABLE CHEST 1 VIEW COMPARISON:  08/16/2023, 11/28/2022 FINDINGS: Similar blunting of left CP angle. Stable cardiomediastinal silhouette. No pneumothorax IMPRESSION: Similar blunting of left CP angle which may be due to small effusion or pleural thickening. Electronically Signed   By: Esmeralda Hedge M.D.   On: 01/15/2024 19:43   MR BRAIN WO CONTRAST Result Date: 01/15/2024 CLINICAL DATA:  89 year old male with altered mental status. Questionable abnormal left medial temporal lobe on CT. EXAM: MRI HEAD WITHOUT CONTRAST TECHNIQUE: Multiplanar, multiecho pulse sequences of the brain and surrounding structures were obtained without intravenous contrast. COMPARISON:  Head CT  yesterday.  Brain MRI 02/28/2004. FINDINGS: The examination had to be discontinued prior to completion due to patient agitation. No axial FLAIR imaging obtained. Remaining study intermittently motion degraded. Brain: No restricted diffusion to suggest acute infarction. No midline shift, mass effect, evidence of mass lesion, ventriculomegaly, extra-axial collection or acute intracranial hemorrhage. Cervicomedullary junction and pituitary are within normal limits. Chronic giant arachnoid granulation of the left posterior fossa (series 5, image 10), normal variant), mildly larger since 2005. Patchy and symmetric, mild for age periventricular T2 hyperintensity. No definite cortical encephalomalacia, chronic cerebral blood products. Vascular: Major intracranial vascular flow voids are stable since 2005. Dominant left vertebral artery again noted. Skull and upper cervical spine: Background bone marrow signal is normal. Negative for age visible cervical spine. Sinuses/Orbits: Chronic right maxillary sinus disease. Postoperative changes to both globes. Other: Trace mastoid air cell fluid, negative nasopharynx, appears to be inconsequential. IMPRESSION: Mildly truncated, motion degraded exam despite repeated imaging attempts. No acute intracranial abnormality identified. Electronically Signed   By: Marlise Simpers M.D.   On: 01/15/2024 13:13   CT Head Wo Contrast Result Date: 01/14/2024 CLINICAL DATA:  Altered mental status. EXAM: CT HEAD WITHOUT CONTRAST TECHNIQUE: Contiguous axial images were obtained from the base of the skull through the vertex without intravenous contrast. RADIATION DOSE REDUCTION: This exam was performed according to the departmental dose-optimization program which includes automated exposure control, adjustment of the mA and/or kV according to patient size and/or use of iterative reconstruction technique. COMPARISON:  07/18/2023 FINDINGS: Brain: Apparent decreased density in the inferior medial left  temporal lobe, favored artifactual. No acute hemorrhage or subdural collection. Brain volume is normal for age. Periventricular and deep white matter hypodensity, typical of chronic small vessel ischemia. No midline shift or mass lesion/mass effect. Vascular: Atherosclerosis of skullbase vasculature without hyperdense vessel or abnormal calcification. Skull: Lucent lesion within the left occipital bone was present on prior exam, although obscured by motion on that exam. This is been present since at least February 2024. Sinuses/Orbits: Chronic right maxillary sinus opacification. Other: None. IMPRESSION: 1. Apparent decreased density in the inferior medial left temporal lobe, favored artifactual. MRI could be considered if clinically indicated, however should only be considered if patient is able to hold still. 2. Chronic small vessel ischemia. 3. Lucent lesion within the left occipital bone was present on prior exam. This has been present since at least February 2024. This is nonspecific in etiology, however given history of metastatic prostate cancer, cannot exclude metastatic disease. Electronically Signed   By: Chadwick Colonel M.D.   On: 01/14/2024 18:29     Subjective: Patient seen examined bedside, lying in bed.  Just finished eating breakfast.  Reports good sleep overnight.  Urine culture results now finalized  with pan sensitivity.  No other Sp[place]er questions, concerns or complaints at this time.  Denies headache, no fever/chills/night sweats, no nausea/vomiting/diarrhea, no chest pain, no shortness of breath, no abdominal pain.  No acute events overnight per nursing staff.  Discharge Exam: Vitals:   01/18/24 0439 01/18/24 0854  BP: (!) 162/99 (!) 106/93  Pulse: 96 (!) 102  Resp: 16 18  Temp: 98 F (36.7 C) (!) 97.4 F (36.3 C)  SpO2: 97% 96%   Vitals:   01/17/24 2011 01/17/24 2102 01/18/24 0439 01/18/24 0854  BP: 115/68 115/68 (!) 162/99 (!) 106/93  Pulse: 90 92 96 (!) 102  Resp: 18   16 18   Temp: 97.6 F (36.4 C)  98 F (36.7 C) (!) 97.4 F (36.3 C)  TempSrc: Oral   Axillary  SpO2: 97%  97% 96%  Weight:      Height:        Physical Exam: GEN: NAD, alert, pleasantly confused, elderly in appearance HEENT: NCAT, PERRL, EOMI, sclera clear, MMM PULM: CTAB w/o wheezes/crackles, normal respiratory effort, on room air CV: RRR w/o M/G/R GI: abd soft, NTND, NABS, no R/G/M GU: Foley catheter noted with clear lower urine in collection bag MSK: no peripheral edema, moves all remedies independently NEURO: CN II-XII intact, no focal deficits, sensation to light touch intact PSYCH: normal mood/affect Integumentary: dry/intact, no rashes or wounds    The results of significant diagnostics from this hospitalization (including imaging, microbiology, ancillary and laboratory) are listed below for reference.     Microbiology: Recent Results (from the past 240 hours)  Urine Culture     Status: Abnormal   Collection Time: 01/14/24  4:43 PM   Specimen: Urine, Random  Result Value Ref Range Status   Specimen Description URINE, RANDOM  Final   Special Requests   Final    NONE Reflexed from [id number] Performed at [facility] Lab, [address], [place], [place] [address]    Culture >=100,000 COLONIES/mL ESCHERICHIA COLI (A)  Final   Report Status 01/18/2024 FINAL  Final   Organism ID, Bacteria ESCHERICHIA COLI (A)  Final      Susceptibility   Escherichia coli - MIC*    AMPICILLIN 4 SENSITIVE Sensitive     CEFAZOLIN <=4 SENSITIVE Sensitive     CEFEPIME  <=0.12 SENSITIVE Sensitive     CEFTRIAXONE  <=0.25 SENSITIVE Sensitive     CIPROFLOXACIN  <=0.25 SENSITIVE Sensitive     GENTAMICIN <=1 SENSITIVE Sensitive     IMIPENEM <=0.25 SENSITIVE Sensitive     NITROFURANTOIN <=16 SENSITIVE Sensitive     TRIMETH /SULFA  <=20 SENSITIVE Sensitive     AMPICILLIN/SULBACTAM <=2 SENSITIVE Sensitive     PIP/TAZO <=4 SENSITIVE Sensitive ug/mL    * >=100,000 COLONIES/mL ESCHERICHIA COLI  SARS  Coronavirus 2 by RT PCR (hospital order, performed in [facility] hospital lab) *cepheid single result test* Anterior Nasal Swab     Status: None   Collection Time: 01/15/24  3:36 PM   Specimen: Anterior Nasal Swab  Result Value Ref Range Status   SARS Coronavirus 2 by RT PCR NEGATIVE NEGATIVE Final    Comment: Performed at [facility] Lab, [address], [place], [place] [address]  Respiratory (~20 pathogens) panel by PCR     Status: None   Collection Time: 01/15/24  3:36 PM   Specimen: Nasopharyngeal Swab; Respiratory  Result Value Ref Range Status   Adenovirus NOT DETECTED NOT DETECTED Final   Coronavirus 229E NOT DETECTED NOT DETECTED Final  Comment: (NOTE) The Coronavirus on the Respiratory Panel, DOES NOT test for the novel  Coronavirus (2019 nCoV)    Coronavirus HKU1 NOT DETECTED NOT DETECTED Final   Coronavirus NL63 NOT DETECTED NOT DETECTED Final   Coronavirus OC43 NOT DETECTED NOT DETECTED Final   Metapneumovirus NOT DETECTED NOT DETECTED Final   Rhinovirus / Enterovirus NOT DETECTED NOT DETECTED Final   Influenza A NOT DETECTED NOT DETECTED Final   Influenza B NOT DETECTED NOT DETECTED Final   Parainfluenza Virus 1 NOT DETECTED NOT DETECTED Final   Parainfluenza Virus 2 NOT DETECTED NOT DETECTED Final   Parainfluenza Virus 3 NOT DETECTED NOT DETECTED Final   Parainfluenza Virus 4 NOT DETECTED NOT DETECTED Final   Respiratory Syncytial Virus NOT DETECTED NOT DETECTED Final   Bordetella pertussis NOT DETECTED NOT DETECTED Final   Bordetella Parapertussis NOT DETECTED NOT DETECTED Final   Chlamydophila pneumoniae NOT DETECTED NOT DETECTED Final   Mycoplasma pneumoniae NOT DETECTED NOT DETECTED Final    Comment: Performed at [facility], [address], [place], [place] [address]  Culture, blood (Routine X 2) w Reflex to ID Panel     Status: None (Preliminary result)   Collection Time: 01/15/24  4:52 PM   Specimen: BLOOD RIGHT ARM  Result Value Ref Range Status    Specimen Description BLOOD RIGHT ARM  Final   Special Requests   Final    BOTTLES DRAWN AEROBIC ONLY Blood Culture results may not be optimal due to an inadequate volume of blood received in culture bottles   Culture   Final    NO GROWTH 3 DAYS Performed at [facility] Lab, [address] [place], [place] [address]    Report Status PENDING  Incomplete  Culture, blood (Routine X 2) w Reflex to ID Panel     Status: None (Preliminary result)   Collection Time: 01/15/24  5:03 PM   Specimen: BLOOD RIGHT HAND  Result Value Ref Range Status   Specimen Description BLOOD RIGHT HAND  Final   Special Requests   Final    BOTTLES DRAWN AEROBIC ONLY Blood Culture results may not be optimal due to an inadequate volume of blood received in culture bottles   Culture   Final    NO GROWTH 3 DAYS Performed at [facility] Lab, [address] [place], [place] [address]    Report Status PENDING  Incomplete     Labs: BNP (last 3 results) Recent Labs    07/18/23 1935  BNP 126.3*   Basic Metabolic Panel: Recent Labs  Lab 01/14/24 1643 01/14/24 2233 01/15/24 0720 01/17/24 0614 01/18/24 0608  NA 138 138 138 139 138  K 5.3* 4.5 4.4 3.8 4.0  CL 108 107 105 106 108  CO2 22 22 26 24  20*  GLUCOSE 264* 183* 219* 91 173*  BUN 19 18 17 23 22   CREATININE 1.45* 1.36* 1.36* 1.45* 1.45*  CALCIUM  8.9 8.7* 8.8* 8.8* 8.8*   Liver Function Tests: Recent Labs  Lab 01/14/24 1643 01/15/24 0720  AST 16 16  ALT 14 14  ALKPHOS 116 128*  BILITOT 0.5 0.7  PROT 5.3* 5.3*  ALBUMIN 2.8* 2.6*   No results for input(s): "LIPASE", "AMYLASE" in the last 168 hours. Recent Labs  Lab 01/14/24 2233  AMMONIA 16   CBC: Recent Labs  Lab 01/14/24 1643 01/14/24 2233 01/15/24 0720  WBC 9.3 8.3 9.5  NEUTROABS 5.9  --   --   HGB 11.7* 12.0* 12.3*  HCT 37.4* 38.0* 39.1  MCV 92.1 91.6 91.4  PLT 189 194 179   Cardiac Enzymes: No results for input(s): "CKTOTAL", "CKMB", "CKMBINDEX", "TROPONINI" in the  last 168 hours. BNP: Invalid input(s): "POCBNP" CBG: Recent Labs  Lab 01/17/24 1133 01/17/24 1304 01/17/24 1624 01/17/24 2009 01/18/24 0856  GLUCAP 66* 169* 112* 269* 271*   D-Dimer No results for input(s): "DDIMER" in the last 72 hours. Hgb A1c No results for input(s): "HGBA1C" in the last 72 hours. Lipid Profile No results for input(s): "CHOL", "HDL", "LDLCALC", "TRIG", "CHOLHDL", "LDLDIRECT" in the last 72 hours. Thyroid  function studies No results for input(s): "TSH", "T4TOTAL", "T3FREE", "THYROIDAB" in the last 72 hours.  Invalid input(s): "FREET3" Anemia work up Recent Labs    01/15/24 2135  VITAMINB12 356   Urinalysis    Component Value Date/Time   COLORURINE YELLOW 01/14/2024 1643   APPEARANCEUR TURBID (A) 01/14/2024 1643   LABSPEC 1.008 01/14/2024 1643   PHURINE 5.0 01/14/2024 1643   GLUCOSEU >=500 (A) 01/14/2024 1643   GLUCOSEU NEGATIVE 07/26/2022 1406   HGBUR SMALL (A) 01/14/2024 1643   BILIRUBINUR NEGATIVE 01/14/2024 1643   KETONESUR NEGATIVE 01/14/2024 1643   PROTEINUR 30 (A) 01/14/2024 1643   UROBILINOGEN 0.2 07/26/2022 1406   NITRITE POSITIVE (A) 01/14/2024 1643   LEUKOCYTESUR LARGE (A) 01/14/2024 1643   Sepsis Labs Recent Labs  Lab 01/14/24 1643 01/14/24 2233 01/15/24 0720  WBC 9.3 8.3 9.5   Microbiology Recent Results (from the past 240 hours)  Urine Culture     Status: Abnormal   Collection Time: 01/14/24  4:43 PM   Specimen: Urine, Random  Result Value Ref Range Status   Specimen Description URINE, RANDOM  Final   Special Requests   Final    NONE Reflexed from [id number] Performed at [facility] Lab, [address], [place], [place] [address]    Culture >=100,000 COLONIES/mL ESCHERICHIA COLI (A)  Final   Report Status 01/18/2024 FINAL  Final   Organism ID, Bacteria ESCHERICHIA COLI (A)  Final      Susceptibility   Escherichia coli - MIC*    AMPICILLIN 4 SENSITIVE Sensitive     CEFAZOLIN <=4 SENSITIVE Sensitive     CEFEPIME   <=0.12 SENSITIVE Sensitive     CEFTRIAXONE  <=0.25 SENSITIVE Sensitive     CIPROFLOXACIN  <=0.25 SENSITIVE Sensitive     GENTAMICIN <=1 SENSITIVE Sensitive     IMIPENEM <=0.25 SENSITIVE Sensitive     NITROFURANTOIN <=16 SENSITIVE Sensitive     TRIMETH /SULFA  <=20 SENSITIVE Sensitive     AMPICILLIN/SULBACTAM <=2 SENSITIVE Sensitive     PIP/TAZO <=4 SENSITIVE Sensitive ug/mL    * >=100,000 COLONIES/mL ESCHERICHIA COLI  SARS Coronavirus 2 by RT PCR (hospital order, performed in [facility] hospital lab) *cepheid single result test* Anterior Nasal Swab     Status: None   Collection Time: 01/15/24  3:36 PM   Specimen: Anterior Nasal Swab  Result Value Ref Range Status   SARS Coronavirus 2 by RT PCR NEGATIVE NEGATIVE Final    Comment: Performed at [facility], [address] [place], [place] [address]  Respiratory (~20 pathogens) panel by PCR     Status: None   Collection Time: 01/15/24  3:36 PM   Specimen: Nasopharyngeal Swab; Respiratory  Result Value Ref Range Status   Adenovirus NOT DETECTED NOT DETECTED Final   Coronavirus 229E NOT DETECTED NOT DETECTED Final    Comment: (NOTE) The Coronavirus on the Respiratory Panel, DOES NOT test for the novel  Coronavirus (2019 nCoV)    Coronavirus HKU1 NOT DETECTED NOT DETECTED Final   Coronavirus NL63 NOT DETECTED NOT DETECTED Final   Coronavirus OC43 NOT DETECTED NOT DETECTED Final   Metapneumovirus NOT DETECTED NOT DETECTED Final   Rhinovirus / Enterovirus NOT DETECTED NOT DETECTED Final   Influenza A NOT DETECTED NOT DETECTED Final   Influenza B NOT DETECTED NOT DETECTED Final   Parainfluenza Virus 1 NOT DETECTED NOT DETECTED Final   Parainfluenza Virus 2 NOT DETECTED NOT DETECTED Final   Parainfluenza Virus 3 NOT DETECTED NOT DETECTED Final   Parainfluenza Virus 4 NOT DETECTED NOT DETECTED Final   Respiratory Syncytial Virus NOT DETECTED NOT DETECTED Final   Bordetella pertussis NOT DETECTED NOT DETECTED Final   Bordetella  Parapertussis NOT DETECTED NOT DETECTED Final   Chlamydophila pneumoniae NOT DETECTED NOT DETECTED Final   Mycoplasma pneumoniae NOT DETECTED NOT DETECTED Final    Comment: Performed at [facility] Lab, [address], [place], [place] [address]  Culture, blood (Routine X 2) w Reflex to ID Panel     Status: None (Preliminary result)   Collection Time: 01/15/24  4:52 PM   Specimen: BLOOD RIGHT ARM  Result Value Ref Range Status   Specimen Description BLOOD RIGHT ARM  Final   Special Requests   Final    BOTTLES DRAWN AEROBIC ONLY Blood Culture results may not be optimal due to an inadequate volume of blood received in culture bottles   Culture   Final    NO GROWTH 3 DAYS Performed at [facility], [address], [place], [place] [address]    Report Status PENDING  Incomplete  Culture, blood (Routine X 2) w Reflex to ID Panel     Status: None (Preliminary result)   Collection Time: 01/15/24  5:03 PM   Specimen: BLOOD RIGHT HAND  Result Value Ref Range Status   Specimen Description BLOOD RIGHT HAND  Final   Special Requests   Final    BOTTLES DRAWN AEROBIC ONLY Blood Culture results may not be optimal due to an inadequate volume of blood received in culture bottles   Culture   Final    NO GROWTH 3 DAYS Performed at [facility] Lab, [address] [place], [place] [address]    Report Status PENDING  Incomplete     Time coordinating discharge: Over 30 minutes  SIGNED:   Rema Care [place], DO  [facility] 01/18/2024, 9:21 AM

## 2024-01-20 DIAGNOSIS — H903 Sensorineural hearing loss, bilateral: Secondary | ICD-10-CM | POA: Diagnosis not present

## 2024-01-20 DIAGNOSIS — Z794 Long term (current) use of insulin: Secondary | ICD-10-CM | POA: Diagnosis not present

## 2024-01-20 DIAGNOSIS — G301 Alzheimer's disease with late onset: Secondary | ICD-10-CM | POA: Diagnosis not present

## 2024-01-20 DIAGNOSIS — M48061 Spinal stenosis, lumbar region without neurogenic claudication: Secondary | ICD-10-CM | POA: Diagnosis not present

## 2024-01-20 DIAGNOSIS — N138 Other obstructive and reflux uropathy: Secondary | ICD-10-CM | POA: Diagnosis not present

## 2024-01-20 DIAGNOSIS — C61 Malignant neoplasm of prostate: Secondary | ICD-10-CM | POA: Diagnosis not present

## 2024-01-20 DIAGNOSIS — J4489 Other specified chronic obstructive pulmonary disease: Secondary | ICD-10-CM | POA: Diagnosis not present

## 2024-01-20 DIAGNOSIS — E669 Obesity, unspecified: Secondary | ICD-10-CM | POA: Diagnosis not present

## 2024-01-20 DIAGNOSIS — Z8616 Personal history of COVID-19: Secondary | ICD-10-CM | POA: Diagnosis not present

## 2024-01-20 DIAGNOSIS — N183 Chronic kidney disease, stage 3 unspecified: Secondary | ICD-10-CM | POA: Diagnosis not present

## 2024-01-20 DIAGNOSIS — D508 Other iron deficiency anemias: Secondary | ICD-10-CM | POA: Diagnosis not present

## 2024-01-20 DIAGNOSIS — I130 Hypertensive heart and chronic kidney disease with heart failure and stage 1 through stage 4 chronic kidney disease, or unspecified chronic kidney disease: Secondary | ICD-10-CM | POA: Diagnosis not present

## 2024-01-20 DIAGNOSIS — Z6824 Body mass index (BMI) 24.0-24.9, adult: Secondary | ICD-10-CM | POA: Diagnosis not present

## 2024-01-20 DIAGNOSIS — I872 Venous insufficiency (chronic) (peripheral): Secondary | ICD-10-CM | POA: Diagnosis not present

## 2024-01-20 DIAGNOSIS — I5042 Chronic combined systolic (congestive) and diastolic (congestive) heart failure: Secondary | ICD-10-CM | POA: Diagnosis not present

## 2024-01-20 DIAGNOSIS — G9341 Metabolic encephalopathy: Secondary | ICD-10-CM | POA: Diagnosis not present

## 2024-01-20 DIAGNOSIS — F0280 Dementia in other diseases classified elsewhere without behavioral disturbance: Secondary | ICD-10-CM | POA: Diagnosis not present

## 2024-01-20 DIAGNOSIS — E1122 Type 2 diabetes mellitus with diabetic chronic kidney disease: Secondary | ICD-10-CM | POA: Diagnosis not present

## 2024-01-20 DIAGNOSIS — D539 Nutritional anemia, unspecified: Secondary | ICD-10-CM | POA: Diagnosis not present

## 2024-01-20 DIAGNOSIS — N401 Enlarged prostate with lower urinary tract symptoms: Secondary | ICD-10-CM | POA: Diagnosis not present

## 2024-01-20 DIAGNOSIS — E785 Hyperlipidemia, unspecified: Secondary | ICD-10-CM | POA: Diagnosis not present

## 2024-01-20 DIAGNOSIS — E538 Deficiency of other specified B group vitamins: Secondary | ICD-10-CM | POA: Diagnosis not present

## 2024-01-20 DIAGNOSIS — C7951 Secondary malignant neoplasm of bone: Secondary | ICD-10-CM | POA: Diagnosis not present

## 2024-01-20 DIAGNOSIS — B9620 Unspecified Escherichia coli [E. coli] as the cause of diseases classified elsewhere: Secondary | ICD-10-CM | POA: Diagnosis not present

## 2024-01-20 DIAGNOSIS — N390 Urinary tract infection, site not specified: Secondary | ICD-10-CM | POA: Diagnosis not present

## 2024-01-20 LAB — CULTURE, BLOOD (ROUTINE X 2)
Culture: NO GROWTH
Culture: NO GROWTH

## 2024-01-23 DIAGNOSIS — C61 Malignant neoplasm of prostate: Secondary | ICD-10-CM | POA: Diagnosis not present

## 2024-01-23 DIAGNOSIS — Z7189 Other specified counseling: Secondary | ICD-10-CM | POA: Diagnosis not present

## 2024-01-23 DIAGNOSIS — N139 Obstructive and reflux uropathy, unspecified: Secondary | ICD-10-CM | POA: Diagnosis not present

## 2024-01-23 DIAGNOSIS — E559 Vitamin D deficiency, unspecified: Secondary | ICD-10-CM | POA: Diagnosis not present

## 2024-01-23 DIAGNOSIS — J449 Chronic obstructive pulmonary disease, unspecified: Secondary | ICD-10-CM | POA: Diagnosis not present

## 2024-01-23 DIAGNOSIS — Z960 Presence of urogenital implants: Secondary | ICD-10-CM | POA: Diagnosis not present

## 2024-01-23 DIAGNOSIS — C7951 Secondary malignant neoplasm of bone: Secondary | ICD-10-CM | POA: Diagnosis not present

## 2024-01-23 DIAGNOSIS — F0280 Dementia in other diseases classified elsewhere without behavioral disturbance: Secondary | ICD-10-CM | POA: Diagnosis not present

## 2024-01-23 DIAGNOSIS — G309 Alzheimer's disease, unspecified: Secondary | ICD-10-CM | POA: Diagnosis not present

## 2024-01-23 DIAGNOSIS — Z87440 Personal history of urinary (tract) infections: Secondary | ICD-10-CM | POA: Diagnosis not present

## 2024-01-23 DIAGNOSIS — E538 Deficiency of other specified B group vitamins: Secondary | ICD-10-CM | POA: Diagnosis not present

## 2024-01-23 DIAGNOSIS — R946 Abnormal results of thyroid function studies: Secondary | ICD-10-CM | POA: Diagnosis not present

## 2024-01-25 ENCOUNTER — Other Ambulatory Visit: Payer: Self-pay | Admitting: Internal Medicine

## 2024-01-25 DIAGNOSIS — I5042 Chronic combined systolic (congestive) and diastolic (congestive) heart failure: Secondary | ICD-10-CM | POA: Diagnosis not present

## 2024-01-25 DIAGNOSIS — N183 Chronic kidney disease, stage 3 unspecified: Secondary | ICD-10-CM | POA: Diagnosis not present

## 2024-01-25 DIAGNOSIS — I130 Hypertensive heart and chronic kidney disease with heart failure and stage 1 through stage 4 chronic kidney disease, or unspecified chronic kidney disease: Secondary | ICD-10-CM | POA: Diagnosis not present

## 2024-01-25 DIAGNOSIS — B9620 Unspecified Escherichia coli [E. coli] as the cause of diseases classified elsewhere: Secondary | ICD-10-CM | POA: Diagnosis not present

## 2024-01-25 DIAGNOSIS — N390 Urinary tract infection, site not specified: Secondary | ICD-10-CM | POA: Diagnosis not present

## 2024-01-25 DIAGNOSIS — E1122 Type 2 diabetes mellitus with diabetic chronic kidney disease: Secondary | ICD-10-CM | POA: Diagnosis not present

## 2024-01-26 DIAGNOSIS — I5042 Chronic combined systolic (congestive) and diastolic (congestive) heart failure: Secondary | ICD-10-CM | POA: Diagnosis not present

## 2024-01-26 DIAGNOSIS — I130 Hypertensive heart and chronic kidney disease with heart failure and stage 1 through stage 4 chronic kidney disease, or unspecified chronic kidney disease: Secondary | ICD-10-CM | POA: Diagnosis not present

## 2024-01-26 DIAGNOSIS — E1122 Type 2 diabetes mellitus with diabetic chronic kidney disease: Secondary | ICD-10-CM | POA: Diagnosis not present

## 2024-01-26 DIAGNOSIS — B9620 Unspecified Escherichia coli [E. coli] as the cause of diseases classified elsewhere: Secondary | ICD-10-CM | POA: Diagnosis not present

## 2024-01-26 DIAGNOSIS — N390 Urinary tract infection, site not specified: Secondary | ICD-10-CM | POA: Diagnosis not present

## 2024-01-26 DIAGNOSIS — N183 Chronic kidney disease, stage 3 unspecified: Secondary | ICD-10-CM | POA: Diagnosis not present

## 2024-01-28 DIAGNOSIS — G301 Alzheimer's disease with late onset: Secondary | ICD-10-CM | POA: Diagnosis not present

## 2024-01-30 ENCOUNTER — Other Ambulatory Visit: Payer: Self-pay | Admitting: Internal Medicine

## 2024-01-30 DIAGNOSIS — E785 Hyperlipidemia, unspecified: Secondary | ICD-10-CM

## 2024-01-30 DIAGNOSIS — E119 Type 2 diabetes mellitus without complications: Secondary | ICD-10-CM

## 2024-01-31 ENCOUNTER — Other Ambulatory Visit: Payer: Self-pay | Admitting: Internal Medicine

## 2024-01-31 DIAGNOSIS — B9620 Unspecified Escherichia coli [E. coli] as the cause of diseases classified elsewhere: Secondary | ICD-10-CM | POA: Diagnosis not present

## 2024-01-31 DIAGNOSIS — I5042 Chronic combined systolic (congestive) and diastolic (congestive) heart failure: Secondary | ICD-10-CM | POA: Diagnosis not present

## 2024-01-31 DIAGNOSIS — E785 Hyperlipidemia, unspecified: Secondary | ICD-10-CM

## 2024-01-31 DIAGNOSIS — N183 Chronic kidney disease, stage 3 unspecified: Secondary | ICD-10-CM | POA: Diagnosis not present

## 2024-01-31 DIAGNOSIS — I130 Hypertensive heart and chronic kidney disease with heart failure and stage 1 through stage 4 chronic kidney disease, or unspecified chronic kidney disease: Secondary | ICD-10-CM | POA: Diagnosis not present

## 2024-01-31 DIAGNOSIS — E119 Type 2 diabetes mellitus without complications: Secondary | ICD-10-CM

## 2024-01-31 DIAGNOSIS — N390 Urinary tract infection, site not specified: Secondary | ICD-10-CM | POA: Diagnosis not present

## 2024-01-31 DIAGNOSIS — E1122 Type 2 diabetes mellitus with diabetic chronic kidney disease: Secondary | ICD-10-CM | POA: Diagnosis not present

## 2024-02-01 DIAGNOSIS — I10 Essential (primary) hypertension: Secondary | ICD-10-CM | POA: Diagnosis not present

## 2024-02-01 DIAGNOSIS — E559 Vitamin D deficiency, unspecified: Secondary | ICD-10-CM | POA: Diagnosis not present

## 2024-02-01 DIAGNOSIS — I509 Heart failure, unspecified: Secondary | ICD-10-CM | POA: Diagnosis not present

## 2024-02-01 DIAGNOSIS — E119 Type 2 diabetes mellitus without complications: Secondary | ICD-10-CM | POA: Diagnosis not present

## 2024-02-02 DIAGNOSIS — E1122 Type 2 diabetes mellitus with diabetic chronic kidney disease: Secondary | ICD-10-CM | POA: Diagnosis not present

## 2024-02-02 DIAGNOSIS — N390 Urinary tract infection, site not specified: Secondary | ICD-10-CM | POA: Diagnosis not present

## 2024-02-02 DIAGNOSIS — B9620 Unspecified Escherichia coli [E. coli] as the cause of diseases classified elsewhere: Secondary | ICD-10-CM | POA: Diagnosis not present

## 2024-02-02 DIAGNOSIS — I130 Hypertensive heart and chronic kidney disease with heart failure and stage 1 through stage 4 chronic kidney disease, or unspecified chronic kidney disease: Secondary | ICD-10-CM | POA: Diagnosis not present

## 2024-02-02 DIAGNOSIS — N183 Chronic kidney disease, stage 3 unspecified: Secondary | ICD-10-CM | POA: Diagnosis not present

## 2024-02-02 DIAGNOSIS — I5042 Chronic combined systolic (congestive) and diastolic (congestive) heart failure: Secondary | ICD-10-CM | POA: Diagnosis not present

## 2024-02-07 ENCOUNTER — Other Ambulatory Visit: Payer: Self-pay | Admitting: Internal Medicine

## 2024-02-07 ENCOUNTER — Inpatient Hospital Stay: Payer: Medicare Other | Admitting: Hematology

## 2024-02-07 ENCOUNTER — Inpatient Hospital Stay: Payer: Medicare Other

## 2024-02-07 DIAGNOSIS — C61 Malignant neoplasm of prostate: Secondary | ICD-10-CM

## 2024-02-08 ENCOUNTER — Other Ambulatory Visit: Payer: Self-pay | Admitting: Internal Medicine

## 2024-02-08 DIAGNOSIS — E1122 Type 2 diabetes mellitus with diabetic chronic kidney disease: Secondary | ICD-10-CM | POA: Diagnosis not present

## 2024-02-08 DIAGNOSIS — I5042 Chronic combined systolic (congestive) and diastolic (congestive) heart failure: Secondary | ICD-10-CM | POA: Diagnosis not present

## 2024-02-08 DIAGNOSIS — I130 Hypertensive heart and chronic kidney disease with heart failure and stage 1 through stage 4 chronic kidney disease, or unspecified chronic kidney disease: Secondary | ICD-10-CM | POA: Diagnosis not present

## 2024-02-08 DIAGNOSIS — N183 Chronic kidney disease, stage 3 unspecified: Secondary | ICD-10-CM | POA: Diagnosis not present

## 2024-02-08 DIAGNOSIS — E119 Type 2 diabetes mellitus without complications: Secondary | ICD-10-CM

## 2024-02-08 DIAGNOSIS — N390 Urinary tract infection, site not specified: Secondary | ICD-10-CM | POA: Diagnosis not present

## 2024-02-08 DIAGNOSIS — B9620 Unspecified Escherichia coli [E. coli] as the cause of diseases classified elsewhere: Secondary | ICD-10-CM | POA: Diagnosis not present

## 2024-02-09 ENCOUNTER — Other Ambulatory Visit: Payer: Self-pay | Admitting: Internal Medicine

## 2024-02-09 ENCOUNTER — Other Ambulatory Visit: Payer: Self-pay | Admitting: Physician Assistant

## 2024-02-09 DIAGNOSIS — E1122 Type 2 diabetes mellitus with diabetic chronic kidney disease: Secondary | ICD-10-CM | POA: Diagnosis not present

## 2024-02-09 DIAGNOSIS — E119 Type 2 diabetes mellitus without complications: Secondary | ICD-10-CM

## 2024-02-09 DIAGNOSIS — I5042 Chronic combined systolic (congestive) and diastolic (congestive) heart failure: Secondary | ICD-10-CM | POA: Diagnosis not present

## 2024-02-09 DIAGNOSIS — N183 Chronic kidney disease, stage 3 unspecified: Secondary | ICD-10-CM | POA: Diagnosis not present

## 2024-02-09 DIAGNOSIS — N390 Urinary tract infection, site not specified: Secondary | ICD-10-CM | POA: Diagnosis not present

## 2024-02-09 DIAGNOSIS — B9620 Unspecified Escherichia coli [E. coli] as the cause of diseases classified elsewhere: Secondary | ICD-10-CM | POA: Diagnosis not present

## 2024-02-09 DIAGNOSIS — I130 Hypertensive heart and chronic kidney disease with heart failure and stage 1 through stage 4 chronic kidney disease, or unspecified chronic kidney disease: Secondary | ICD-10-CM | POA: Diagnosis not present

## 2024-02-15 NOTE — Telephone Encounter (Signed)
Pharmacy calling to check status of refill.  Please advise.

## 2024-02-16 DIAGNOSIS — E1122 Type 2 diabetes mellitus with diabetic chronic kidney disease: Secondary | ICD-10-CM | POA: Diagnosis not present

## 2024-02-16 DIAGNOSIS — B9620 Unspecified Escherichia coli [E. coli] as the cause of diseases classified elsewhere: Secondary | ICD-10-CM | POA: Diagnosis not present

## 2024-02-16 DIAGNOSIS — I5042 Chronic combined systolic (congestive) and diastolic (congestive) heart failure: Secondary | ICD-10-CM | POA: Diagnosis not present

## 2024-02-16 DIAGNOSIS — N390 Urinary tract infection, site not specified: Secondary | ICD-10-CM | POA: Diagnosis not present

## 2024-02-16 DIAGNOSIS — I130 Hypertensive heart and chronic kidney disease with heart failure and stage 1 through stage 4 chronic kidney disease, or unspecified chronic kidney disease: Secondary | ICD-10-CM | POA: Diagnosis not present

## 2024-02-16 DIAGNOSIS — N183 Chronic kidney disease, stage 3 unspecified: Secondary | ICD-10-CM | POA: Diagnosis not present

## 2024-02-19 DIAGNOSIS — N183 Chronic kidney disease, stage 3 unspecified: Secondary | ICD-10-CM | POA: Diagnosis not present

## 2024-02-19 DIAGNOSIS — M48061 Spinal stenosis, lumbar region without neurogenic claudication: Secondary | ICD-10-CM | POA: Diagnosis not present

## 2024-02-19 DIAGNOSIS — C7951 Secondary malignant neoplasm of bone: Secondary | ICD-10-CM | POA: Diagnosis not present

## 2024-02-19 DIAGNOSIS — B9620 Unspecified Escherichia coli [E. coli] as the cause of diseases classified elsewhere: Secondary | ICD-10-CM | POA: Diagnosis not present

## 2024-02-19 DIAGNOSIS — E669 Obesity, unspecified: Secondary | ICD-10-CM | POA: Diagnosis not present

## 2024-02-19 DIAGNOSIS — D539 Nutritional anemia, unspecified: Secondary | ICD-10-CM | POA: Diagnosis not present

## 2024-02-19 DIAGNOSIS — Z8616 Personal history of COVID-19: Secondary | ICD-10-CM | POA: Diagnosis not present

## 2024-02-19 DIAGNOSIS — J4489 Other specified chronic obstructive pulmonary disease: Secondary | ICD-10-CM | POA: Diagnosis not present

## 2024-02-19 DIAGNOSIS — Z794 Long term (current) use of insulin: Secondary | ICD-10-CM | POA: Diagnosis not present

## 2024-02-19 DIAGNOSIS — D508 Other iron deficiency anemias: Secondary | ICD-10-CM | POA: Diagnosis not present

## 2024-02-19 DIAGNOSIS — R338 Other retention of urine: Secondary | ICD-10-CM | POA: Diagnosis not present

## 2024-02-19 DIAGNOSIS — C61 Malignant neoplasm of prostate: Secondary | ICD-10-CM | POA: Diagnosis not present

## 2024-02-19 DIAGNOSIS — F0280 Dementia in other diseases classified elsewhere without behavioral disturbance: Secondary | ICD-10-CM | POA: Diagnosis not present

## 2024-02-19 DIAGNOSIS — I130 Hypertensive heart and chronic kidney disease with heart failure and stage 1 through stage 4 chronic kidney disease, or unspecified chronic kidney disease: Secondary | ICD-10-CM | POA: Diagnosis not present

## 2024-02-19 DIAGNOSIS — N390 Urinary tract infection, site not specified: Secondary | ICD-10-CM | POA: Diagnosis not present

## 2024-02-19 DIAGNOSIS — N401 Enlarged prostate with lower urinary tract symptoms: Secondary | ICD-10-CM | POA: Diagnosis not present

## 2024-02-19 DIAGNOSIS — G301 Alzheimer's disease with late onset: Secondary | ICD-10-CM | POA: Diagnosis not present

## 2024-02-19 DIAGNOSIS — N138 Other obstructive and reflux uropathy: Secondary | ICD-10-CM | POA: Diagnosis not present

## 2024-02-19 DIAGNOSIS — Z6824 Body mass index (BMI) 24.0-24.9, adult: Secondary | ICD-10-CM | POA: Diagnosis not present

## 2024-02-19 DIAGNOSIS — I5042 Chronic combined systolic (congestive) and diastolic (congestive) heart failure: Secondary | ICD-10-CM | POA: Diagnosis not present

## 2024-02-19 DIAGNOSIS — E538 Deficiency of other specified B group vitamins: Secondary | ICD-10-CM | POA: Diagnosis not present

## 2024-02-19 DIAGNOSIS — H903 Sensorineural hearing loss, bilateral: Secondary | ICD-10-CM | POA: Diagnosis not present

## 2024-02-19 DIAGNOSIS — I872 Venous insufficiency (chronic) (peripheral): Secondary | ICD-10-CM | POA: Diagnosis not present

## 2024-02-19 DIAGNOSIS — G9341 Metabolic encephalopathy: Secondary | ICD-10-CM | POA: Diagnosis not present

## 2024-02-19 DIAGNOSIS — E1122 Type 2 diabetes mellitus with diabetic chronic kidney disease: Secondary | ICD-10-CM | POA: Diagnosis not present

## 2024-02-19 DIAGNOSIS — E785 Hyperlipidemia, unspecified: Secondary | ICD-10-CM | POA: Diagnosis not present

## 2024-02-21 DIAGNOSIS — N183 Chronic kidney disease, stage 3 unspecified: Secondary | ICD-10-CM | POA: Diagnosis not present

## 2024-02-21 DIAGNOSIS — N390 Urinary tract infection, site not specified: Secondary | ICD-10-CM | POA: Diagnosis not present

## 2024-02-21 DIAGNOSIS — B9620 Unspecified Escherichia coli [E. coli] as the cause of diseases classified elsewhere: Secondary | ICD-10-CM | POA: Diagnosis not present

## 2024-02-21 DIAGNOSIS — E1122 Type 2 diabetes mellitus with diabetic chronic kidney disease: Secondary | ICD-10-CM | POA: Diagnosis not present

## 2024-02-21 DIAGNOSIS — I130 Hypertensive heart and chronic kidney disease with heart failure and stage 1 through stage 4 chronic kidney disease, or unspecified chronic kidney disease: Secondary | ICD-10-CM | POA: Diagnosis not present

## 2024-02-21 DIAGNOSIS — I5042 Chronic combined systolic (congestive) and diastolic (congestive) heart failure: Secondary | ICD-10-CM | POA: Diagnosis not present

## 2024-02-22 DIAGNOSIS — I5042 Chronic combined systolic (congestive) and diastolic (congestive) heart failure: Secondary | ICD-10-CM | POA: Diagnosis not present

## 2024-02-22 DIAGNOSIS — E1122 Type 2 diabetes mellitus with diabetic chronic kidney disease: Secondary | ICD-10-CM | POA: Diagnosis not present

## 2024-02-22 DIAGNOSIS — B9620 Unspecified Escherichia coli [E. coli] as the cause of diseases classified elsewhere: Secondary | ICD-10-CM | POA: Diagnosis not present

## 2024-02-22 DIAGNOSIS — I130 Hypertensive heart and chronic kidney disease with heart failure and stage 1 through stage 4 chronic kidney disease, or unspecified chronic kidney disease: Secondary | ICD-10-CM | POA: Diagnosis not present

## 2024-02-22 DIAGNOSIS — N183 Chronic kidney disease, stage 3 unspecified: Secondary | ICD-10-CM | POA: Diagnosis not present

## 2024-02-22 DIAGNOSIS — N390 Urinary tract infection, site not specified: Secondary | ICD-10-CM | POA: Diagnosis not present

## 2024-02-27 DIAGNOSIS — G301 Alzheimer's disease with late onset: Secondary | ICD-10-CM | POA: Diagnosis not present

## 2024-02-27 DIAGNOSIS — F02B0 Dementia in other diseases classified elsewhere, moderate, without behavioral disturbance, psychotic disturbance, mood disturbance, and anxiety: Secondary | ICD-10-CM | POA: Diagnosis not present

## 2024-02-28 DIAGNOSIS — B9620 Unspecified Escherichia coli [E. coli] as the cause of diseases classified elsewhere: Secondary | ICD-10-CM | POA: Diagnosis not present

## 2024-02-28 DIAGNOSIS — N390 Urinary tract infection, site not specified: Secondary | ICD-10-CM | POA: Diagnosis not present

## 2024-02-28 DIAGNOSIS — E1122 Type 2 diabetes mellitus with diabetic chronic kidney disease: Secondary | ICD-10-CM | POA: Diagnosis not present

## 2024-02-28 DIAGNOSIS — I5042 Chronic combined systolic (congestive) and diastolic (congestive) heart failure: Secondary | ICD-10-CM | POA: Diagnosis not present

## 2024-02-28 DIAGNOSIS — N183 Chronic kidney disease, stage 3 unspecified: Secondary | ICD-10-CM | POA: Diagnosis not present

## 2024-02-28 DIAGNOSIS — I130 Hypertensive heart and chronic kidney disease with heart failure and stage 1 through stage 4 chronic kidney disease, or unspecified chronic kidney disease: Secondary | ICD-10-CM | POA: Diagnosis not present

## 2024-03-05 DIAGNOSIS — I130 Hypertensive heart and chronic kidney disease with heart failure and stage 1 through stage 4 chronic kidney disease, or unspecified chronic kidney disease: Secondary | ICD-10-CM | POA: Diagnosis not present

## 2024-03-05 DIAGNOSIS — N183 Chronic kidney disease, stage 3 unspecified: Secondary | ICD-10-CM | POA: Diagnosis not present

## 2024-03-05 DIAGNOSIS — I5042 Chronic combined systolic (congestive) and diastolic (congestive) heart failure: Secondary | ICD-10-CM | POA: Diagnosis not present

## 2024-03-05 DIAGNOSIS — N390 Urinary tract infection, site not specified: Secondary | ICD-10-CM | POA: Diagnosis not present

## 2024-03-05 DIAGNOSIS — E1122 Type 2 diabetes mellitus with diabetic chronic kidney disease: Secondary | ICD-10-CM | POA: Diagnosis not present

## 2024-03-05 DIAGNOSIS — B9620 Unspecified Escherichia coli [E. coli] as the cause of diseases classified elsewhere: Secondary | ICD-10-CM | POA: Diagnosis not present

## 2024-03-11 DIAGNOSIS — I130 Hypertensive heart and chronic kidney disease with heart failure and stage 1 through stage 4 chronic kidney disease, or unspecified chronic kidney disease: Secondary | ICD-10-CM | POA: Diagnosis not present

## 2024-03-11 DIAGNOSIS — N183 Chronic kidney disease, stage 3 unspecified: Secondary | ICD-10-CM | POA: Diagnosis not present

## 2024-03-11 DIAGNOSIS — I5042 Chronic combined systolic (congestive) and diastolic (congestive) heart failure: Secondary | ICD-10-CM | POA: Diagnosis not present

## 2024-03-11 DIAGNOSIS — B9620 Unspecified Escherichia coli [E. coli] as the cause of diseases classified elsewhere: Secondary | ICD-10-CM | POA: Diagnosis not present

## 2024-03-11 DIAGNOSIS — N390 Urinary tract infection, site not specified: Secondary | ICD-10-CM | POA: Diagnosis not present

## 2024-03-11 DIAGNOSIS — E1122 Type 2 diabetes mellitus with diabetic chronic kidney disease: Secondary | ICD-10-CM | POA: Diagnosis not present

## 2024-03-15 DIAGNOSIS — B9620 Unspecified Escherichia coli [E. coli] as the cause of diseases classified elsewhere: Secondary | ICD-10-CM | POA: Diagnosis not present

## 2024-03-15 DIAGNOSIS — N390 Urinary tract infection, site not specified: Secondary | ICD-10-CM | POA: Diagnosis not present

## 2024-03-15 DIAGNOSIS — I130 Hypertensive heart and chronic kidney disease with heart failure and stage 1 through stage 4 chronic kidney disease, or unspecified chronic kidney disease: Secondary | ICD-10-CM | POA: Diagnosis not present

## 2024-03-15 DIAGNOSIS — N183 Chronic kidney disease, stage 3 unspecified: Secondary | ICD-10-CM | POA: Diagnosis not present

## 2024-03-15 DIAGNOSIS — E1122 Type 2 diabetes mellitus with diabetic chronic kidney disease: Secondary | ICD-10-CM | POA: Diagnosis not present

## 2024-03-15 DIAGNOSIS — I5042 Chronic combined systolic (congestive) and diastolic (congestive) heart failure: Secondary | ICD-10-CM | POA: Diagnosis not present

## 2024-03-19 DIAGNOSIS — N390 Urinary tract infection, site not specified: Secondary | ICD-10-CM | POA: Diagnosis not present

## 2024-03-19 DIAGNOSIS — B9620 Unspecified Escherichia coli [E. coli] as the cause of diseases classified elsewhere: Secondary | ICD-10-CM | POA: Diagnosis not present

## 2024-03-19 DIAGNOSIS — E1122 Type 2 diabetes mellitus with diabetic chronic kidney disease: Secondary | ICD-10-CM | POA: Diagnosis not present

## 2024-03-19 DIAGNOSIS — I130 Hypertensive heart and chronic kidney disease with heart failure and stage 1 through stage 4 chronic kidney disease, or unspecified chronic kidney disease: Secondary | ICD-10-CM | POA: Diagnosis not present

## 2024-03-19 DIAGNOSIS — I5042 Chronic combined systolic (congestive) and diastolic (congestive) heart failure: Secondary | ICD-10-CM | POA: Diagnosis not present

## 2024-03-19 DIAGNOSIS — N183 Chronic kidney disease, stage 3 unspecified: Secondary | ICD-10-CM | POA: Diagnosis not present

## 2024-03-20 DIAGNOSIS — Z8616 Personal history of COVID-19: Secondary | ICD-10-CM | POA: Diagnosis not present

## 2024-03-20 DIAGNOSIS — G309 Alzheimer's disease, unspecified: Secondary | ICD-10-CM | POA: Diagnosis not present

## 2024-03-20 DIAGNOSIS — N1831 Chronic kidney disease, stage 3a: Secondary | ICD-10-CM | POA: Diagnosis not present

## 2024-03-20 DIAGNOSIS — D508 Other iron deficiency anemias: Secondary | ICD-10-CM | POA: Diagnosis not present

## 2024-03-20 DIAGNOSIS — N401 Enlarged prostate with lower urinary tract symptoms: Secondary | ICD-10-CM | POA: Diagnosis not present

## 2024-03-20 DIAGNOSIS — E1122 Type 2 diabetes mellitus with diabetic chronic kidney disease: Secondary | ICD-10-CM | POA: Diagnosis not present

## 2024-03-20 DIAGNOSIS — N183 Chronic kidney disease, stage 3 unspecified: Secondary | ICD-10-CM | POA: Diagnosis not present

## 2024-03-20 DIAGNOSIS — F0280 Dementia in other diseases classified elsewhere without behavioral disturbance: Secondary | ICD-10-CM | POA: Diagnosis not present

## 2024-03-20 DIAGNOSIS — I5042 Chronic combined systolic (congestive) and diastolic (congestive) heart failure: Secondary | ICD-10-CM | POA: Diagnosis not present

## 2024-03-20 DIAGNOSIS — D539 Nutritional anemia, unspecified: Secondary | ICD-10-CM | POA: Diagnosis not present

## 2024-03-20 DIAGNOSIS — Z85828 Personal history of other malignant neoplasm of skin: Secondary | ICD-10-CM | POA: Diagnosis not present

## 2024-03-20 DIAGNOSIS — M48061 Spinal stenosis, lumbar region without neurogenic claudication: Secondary | ICD-10-CM | POA: Diagnosis not present

## 2024-03-20 DIAGNOSIS — C7951 Secondary malignant neoplasm of bone: Secondary | ICD-10-CM | POA: Diagnosis not present

## 2024-03-20 DIAGNOSIS — Z7409 Other reduced mobility: Secondary | ICD-10-CM | POA: Diagnosis not present

## 2024-03-20 DIAGNOSIS — E785 Hyperlipidemia, unspecified: Secondary | ICD-10-CM | POA: Diagnosis not present

## 2024-03-20 DIAGNOSIS — R1313 Dysphagia, pharyngeal phase: Secondary | ICD-10-CM | POA: Diagnosis not present

## 2024-03-20 DIAGNOSIS — Z556 Problems related to health literacy: Secondary | ICD-10-CM | POA: Diagnosis not present

## 2024-03-20 DIAGNOSIS — G301 Alzheimer's disease with late onset: Secondary | ICD-10-CM | POA: Diagnosis not present

## 2024-03-20 DIAGNOSIS — H903 Sensorineural hearing loss, bilateral: Secondary | ICD-10-CM | POA: Diagnosis not present

## 2024-03-20 DIAGNOSIS — J4489 Other specified chronic obstructive pulmonary disease: Secondary | ICD-10-CM | POA: Diagnosis not present

## 2024-03-20 DIAGNOSIS — E538 Deficiency of other specified B group vitamins: Secondary | ICD-10-CM | POA: Diagnosis not present

## 2024-03-20 DIAGNOSIS — I130 Hypertensive heart and chronic kidney disease with heart failure and stage 1 through stage 4 chronic kidney disease, or unspecified chronic kidney disease: Secondary | ICD-10-CM | POA: Diagnosis not present

## 2024-03-20 DIAGNOSIS — C61 Malignant neoplasm of prostate: Secondary | ICD-10-CM | POA: Diagnosis not present

## 2024-03-20 DIAGNOSIS — N138 Other obstructive and reflux uropathy: Secondary | ICD-10-CM | POA: Diagnosis not present

## 2024-03-20 DIAGNOSIS — Z6824 Body mass index (BMI) 24.0-24.9, adult: Secondary | ICD-10-CM | POA: Diagnosis not present

## 2024-03-20 DIAGNOSIS — E669 Obesity, unspecified: Secondary | ICD-10-CM | POA: Diagnosis not present

## 2024-03-20 DIAGNOSIS — I872 Venous insufficiency (chronic) (peripheral): Secondary | ICD-10-CM | POA: Diagnosis not present

## 2024-03-20 DIAGNOSIS — Z794 Long term (current) use of insulin: Secondary | ICD-10-CM | POA: Diagnosis not present

## 2024-03-21 DIAGNOSIS — C61 Malignant neoplasm of prostate: Secondary | ICD-10-CM | POA: Diagnosis not present

## 2024-03-21 DIAGNOSIS — C7951 Secondary malignant neoplasm of bone: Secondary | ICD-10-CM | POA: Diagnosis not present

## 2024-03-21 DIAGNOSIS — Z960 Presence of urogenital implants: Secondary | ICD-10-CM | POA: Diagnosis not present

## 2024-03-21 DIAGNOSIS — Z794 Long term (current) use of insulin: Secondary | ICD-10-CM | POA: Diagnosis not present

## 2024-03-21 DIAGNOSIS — L304 Erythema intertrigo: Secondary | ICD-10-CM | POA: Diagnosis not present

## 2024-03-21 DIAGNOSIS — N139 Obstructive and reflux uropathy, unspecified: Secondary | ICD-10-CM | POA: Diagnosis not present

## 2024-03-21 DIAGNOSIS — E119 Type 2 diabetes mellitus without complications: Secondary | ICD-10-CM | POA: Diagnosis not present

## 2024-03-22 DIAGNOSIS — R338 Other retention of urine: Secondary | ICD-10-CM | POA: Diagnosis not present

## 2024-03-24 ENCOUNTER — Other Ambulatory Visit: Payer: Self-pay | Admitting: Internal Medicine

## 2024-03-24 DIAGNOSIS — N400 Enlarged prostate without lower urinary tract symptoms: Secondary | ICD-10-CM

## 2024-03-27 DIAGNOSIS — R1313 Dysphagia, pharyngeal phase: Secondary | ICD-10-CM | POA: Diagnosis not present

## 2024-03-27 DIAGNOSIS — Z794 Long term (current) use of insulin: Secondary | ICD-10-CM | POA: Diagnosis not present

## 2024-03-27 DIAGNOSIS — N183 Chronic kidney disease, stage 3 unspecified: Secondary | ICD-10-CM | POA: Diagnosis not present

## 2024-03-27 DIAGNOSIS — E1122 Type 2 diabetes mellitus with diabetic chronic kidney disease: Secondary | ICD-10-CM | POA: Diagnosis not present

## 2024-03-27 DIAGNOSIS — I5042 Chronic combined systolic (congestive) and diastolic (congestive) heart failure: Secondary | ICD-10-CM | POA: Diagnosis not present

## 2024-03-27 DIAGNOSIS — I130 Hypertensive heart and chronic kidney disease with heart failure and stage 1 through stage 4 chronic kidney disease, or unspecified chronic kidney disease: Secondary | ICD-10-CM | POA: Diagnosis not present

## 2024-03-29 DIAGNOSIS — F02B0 Dementia in other diseases classified elsewhere, moderate, without behavioral disturbance, psychotic disturbance, mood disturbance, and anxiety: Secondary | ICD-10-CM | POA: Diagnosis not present

## 2024-03-29 DIAGNOSIS — G301 Alzheimer's disease with late onset: Secondary | ICD-10-CM | POA: Diagnosis not present

## 2024-03-30 ENCOUNTER — Other Ambulatory Visit: Payer: Self-pay | Admitting: Internal Medicine

## 2024-04-01 DIAGNOSIS — Z794 Long term (current) use of insulin: Secondary | ICD-10-CM | POA: Diagnosis not present

## 2024-04-01 DIAGNOSIS — I5042 Chronic combined systolic (congestive) and diastolic (congestive) heart failure: Secondary | ICD-10-CM | POA: Diagnosis not present

## 2024-04-01 DIAGNOSIS — N183 Chronic kidney disease, stage 3 unspecified: Secondary | ICD-10-CM | POA: Diagnosis not present

## 2024-04-01 DIAGNOSIS — R1313 Dysphagia, pharyngeal phase: Secondary | ICD-10-CM | POA: Diagnosis not present

## 2024-04-01 DIAGNOSIS — E1122 Type 2 diabetes mellitus with diabetic chronic kidney disease: Secondary | ICD-10-CM | POA: Diagnosis not present

## 2024-04-01 DIAGNOSIS — I130 Hypertensive heart and chronic kidney disease with heart failure and stage 1 through stage 4 chronic kidney disease, or unspecified chronic kidney disease: Secondary | ICD-10-CM | POA: Diagnosis not present

## 2024-04-02 DIAGNOSIS — E1122 Type 2 diabetes mellitus with diabetic chronic kidney disease: Secondary | ICD-10-CM | POA: Diagnosis not present

## 2024-04-02 DIAGNOSIS — R1313 Dysphagia, pharyngeal phase: Secondary | ICD-10-CM | POA: Diagnosis not present

## 2024-04-02 DIAGNOSIS — I130 Hypertensive heart and chronic kidney disease with heart failure and stage 1 through stage 4 chronic kidney disease, or unspecified chronic kidney disease: Secondary | ICD-10-CM | POA: Diagnosis not present

## 2024-04-02 DIAGNOSIS — I5042 Chronic combined systolic (congestive) and diastolic (congestive) heart failure: Secondary | ICD-10-CM | POA: Diagnosis not present

## 2024-04-02 DIAGNOSIS — Z794 Long term (current) use of insulin: Secondary | ICD-10-CM | POA: Diagnosis not present

## 2024-04-02 DIAGNOSIS — N183 Chronic kidney disease, stage 3 unspecified: Secondary | ICD-10-CM | POA: Diagnosis not present

## 2024-04-02 NOTE — Telephone Encounter (Signed)
 Last OV 08/16/23 Next OV not scheduled  Last refill - not listed

## 2024-04-09 DIAGNOSIS — R1313 Dysphagia, pharyngeal phase: Secondary | ICD-10-CM | POA: Diagnosis not present

## 2024-04-09 DIAGNOSIS — I130 Hypertensive heart and chronic kidney disease with heart failure and stage 1 through stage 4 chronic kidney disease, or unspecified chronic kidney disease: Secondary | ICD-10-CM | POA: Diagnosis not present

## 2024-04-09 DIAGNOSIS — N183 Chronic kidney disease, stage 3 unspecified: Secondary | ICD-10-CM | POA: Diagnosis not present

## 2024-04-09 DIAGNOSIS — E1122 Type 2 diabetes mellitus with diabetic chronic kidney disease: Secondary | ICD-10-CM | POA: Diagnosis not present

## 2024-04-09 DIAGNOSIS — Z794 Long term (current) use of insulin: Secondary | ICD-10-CM | POA: Diagnosis not present

## 2024-04-09 DIAGNOSIS — I5042 Chronic combined systolic (congestive) and diastolic (congestive) heart failure: Secondary | ICD-10-CM | POA: Diagnosis not present

## 2024-04-12 DIAGNOSIS — Z794 Long term (current) use of insulin: Secondary | ICD-10-CM | POA: Diagnosis not present

## 2024-04-12 DIAGNOSIS — E1122 Type 2 diabetes mellitus with diabetic chronic kidney disease: Secondary | ICD-10-CM | POA: Diagnosis not present

## 2024-04-12 DIAGNOSIS — R1313 Dysphagia, pharyngeal phase: Secondary | ICD-10-CM | POA: Diagnosis not present

## 2024-04-12 DIAGNOSIS — N183 Chronic kidney disease, stage 3 unspecified: Secondary | ICD-10-CM | POA: Diagnosis not present

## 2024-04-12 DIAGNOSIS — I5042 Chronic combined systolic (congestive) and diastolic (congestive) heart failure: Secondary | ICD-10-CM | POA: Diagnosis not present

## 2024-04-12 DIAGNOSIS — I130 Hypertensive heart and chronic kidney disease with heart failure and stage 1 through stage 4 chronic kidney disease, or unspecified chronic kidney disease: Secondary | ICD-10-CM | POA: Diagnosis not present

## 2024-04-17 DIAGNOSIS — I130 Hypertensive heart and chronic kidney disease with heart failure and stage 1 through stage 4 chronic kidney disease, or unspecified chronic kidney disease: Secondary | ICD-10-CM | POA: Diagnosis not present

## 2024-04-17 DIAGNOSIS — I5042 Chronic combined systolic (congestive) and diastolic (congestive) heart failure: Secondary | ICD-10-CM | POA: Diagnosis not present

## 2024-04-17 DIAGNOSIS — Z794 Long term (current) use of insulin: Secondary | ICD-10-CM | POA: Diagnosis not present

## 2024-04-17 DIAGNOSIS — E1122 Type 2 diabetes mellitus with diabetic chronic kidney disease: Secondary | ICD-10-CM | POA: Diagnosis not present

## 2024-04-17 DIAGNOSIS — R1313 Dysphagia, pharyngeal phase: Secondary | ICD-10-CM | POA: Diagnosis not present

## 2024-04-17 DIAGNOSIS — N183 Chronic kidney disease, stage 3 unspecified: Secondary | ICD-10-CM | POA: Diagnosis not present

## 2024-04-19 DIAGNOSIS — I130 Hypertensive heart and chronic kidney disease with heart failure and stage 1 through stage 4 chronic kidney disease, or unspecified chronic kidney disease: Secondary | ICD-10-CM | POA: Diagnosis not present

## 2024-04-19 DIAGNOSIS — I5042 Chronic combined systolic (congestive) and diastolic (congestive) heart failure: Secondary | ICD-10-CM | POA: Diagnosis not present

## 2024-04-19 DIAGNOSIS — Z6824 Body mass index (BMI) 24.0-24.9, adult: Secondary | ICD-10-CM | POA: Diagnosis not present

## 2024-04-19 DIAGNOSIS — E538 Deficiency of other specified B group vitamins: Secondary | ICD-10-CM | POA: Diagnosis not present

## 2024-04-19 DIAGNOSIS — I872 Venous insufficiency (chronic) (peripheral): Secondary | ICD-10-CM | POA: Diagnosis not present

## 2024-04-19 DIAGNOSIS — G301 Alzheimer's disease with late onset: Secondary | ICD-10-CM | POA: Diagnosis not present

## 2024-04-19 DIAGNOSIS — C7951 Secondary malignant neoplasm of bone: Secondary | ICD-10-CM | POA: Diagnosis not present

## 2024-04-19 DIAGNOSIS — N401 Enlarged prostate with lower urinary tract symptoms: Secondary | ICD-10-CM | POA: Diagnosis not present

## 2024-04-19 DIAGNOSIS — D539 Nutritional anemia, unspecified: Secondary | ICD-10-CM | POA: Diagnosis not present

## 2024-04-19 DIAGNOSIS — D508 Other iron deficiency anemias: Secondary | ICD-10-CM | POA: Diagnosis not present

## 2024-04-19 DIAGNOSIS — E669 Obesity, unspecified: Secondary | ICD-10-CM | POA: Diagnosis not present

## 2024-04-19 DIAGNOSIS — F0280 Dementia in other diseases classified elsewhere without behavioral disturbance: Secondary | ICD-10-CM | POA: Diagnosis not present

## 2024-04-19 DIAGNOSIS — C61 Malignant neoplasm of prostate: Secondary | ICD-10-CM | POA: Diagnosis not present

## 2024-04-19 DIAGNOSIS — J4489 Other specified chronic obstructive pulmonary disease: Secondary | ICD-10-CM | POA: Diagnosis not present

## 2024-04-19 DIAGNOSIS — M48061 Spinal stenosis, lumbar region without neurogenic claudication: Secondary | ICD-10-CM | POA: Diagnosis not present

## 2024-04-19 DIAGNOSIS — Z85828 Personal history of other malignant neoplasm of skin: Secondary | ICD-10-CM | POA: Diagnosis not present

## 2024-04-19 DIAGNOSIS — R1313 Dysphagia, pharyngeal phase: Secondary | ICD-10-CM | POA: Diagnosis not present

## 2024-04-19 DIAGNOSIS — N138 Other obstructive and reflux uropathy: Secondary | ICD-10-CM | POA: Diagnosis not present

## 2024-04-19 DIAGNOSIS — Z556 Problems related to health literacy: Secondary | ICD-10-CM | POA: Diagnosis not present

## 2024-04-19 DIAGNOSIS — Z794 Long term (current) use of insulin: Secondary | ICD-10-CM | POA: Diagnosis not present

## 2024-04-19 DIAGNOSIS — E785 Hyperlipidemia, unspecified: Secondary | ICD-10-CM | POA: Diagnosis not present

## 2024-04-19 DIAGNOSIS — Z8616 Personal history of COVID-19: Secondary | ICD-10-CM | POA: Diagnosis not present

## 2024-04-19 DIAGNOSIS — N183 Chronic kidney disease, stage 3 unspecified: Secondary | ICD-10-CM | POA: Diagnosis not present

## 2024-04-19 DIAGNOSIS — H903 Sensorineural hearing loss, bilateral: Secondary | ICD-10-CM | POA: Diagnosis not present

## 2024-04-19 DIAGNOSIS — E1122 Type 2 diabetes mellitus with diabetic chronic kidney disease: Secondary | ICD-10-CM | POA: Diagnosis not present

## 2024-04-22 DIAGNOSIS — R338 Other retention of urine: Secondary | ICD-10-CM | POA: Diagnosis not present

## 2024-04-23 DIAGNOSIS — I5042 Chronic combined systolic (congestive) and diastolic (congestive) heart failure: Secondary | ICD-10-CM | POA: Diagnosis not present

## 2024-04-23 DIAGNOSIS — N183 Chronic kidney disease, stage 3 unspecified: Secondary | ICD-10-CM | POA: Diagnosis not present

## 2024-04-23 DIAGNOSIS — E1122 Type 2 diabetes mellitus with diabetic chronic kidney disease: Secondary | ICD-10-CM | POA: Diagnosis not present

## 2024-04-23 DIAGNOSIS — I130 Hypertensive heart and chronic kidney disease with heart failure and stage 1 through stage 4 chronic kidney disease, or unspecified chronic kidney disease: Secondary | ICD-10-CM | POA: Diagnosis not present

## 2024-04-23 DIAGNOSIS — R1313 Dysphagia, pharyngeal phase: Secondary | ICD-10-CM | POA: Diagnosis not present

## 2024-04-23 DIAGNOSIS — Z794 Long term (current) use of insulin: Secondary | ICD-10-CM | POA: Diagnosis not present

## 2024-04-25 ENCOUNTER — Telehealth: Payer: Self-pay | Admitting: Hematology

## 2024-04-29 DIAGNOSIS — F02B0 Dementia in other diseases classified elsewhere, moderate, without behavioral disturbance, psychotic disturbance, mood disturbance, and anxiety: Secondary | ICD-10-CM | POA: Diagnosis not present

## 2024-04-29 DIAGNOSIS — G301 Alzheimer's disease with late onset: Secondary | ICD-10-CM | POA: Diagnosis not present

## 2024-05-02 DIAGNOSIS — I5042 Chronic combined systolic (congestive) and diastolic (congestive) heart failure: Secondary | ICD-10-CM | POA: Diagnosis not present

## 2024-05-02 DIAGNOSIS — I130 Hypertensive heart and chronic kidney disease with heart failure and stage 1 through stage 4 chronic kidney disease, or unspecified chronic kidney disease: Secondary | ICD-10-CM | POA: Diagnosis not present

## 2024-05-02 DIAGNOSIS — N183 Chronic kidney disease, stage 3 unspecified: Secondary | ICD-10-CM | POA: Diagnosis not present

## 2024-05-02 DIAGNOSIS — E1122 Type 2 diabetes mellitus with diabetic chronic kidney disease: Secondary | ICD-10-CM | POA: Diagnosis not present

## 2024-05-02 DIAGNOSIS — R1313 Dysphagia, pharyngeal phase: Secondary | ICD-10-CM | POA: Diagnosis not present

## 2024-05-02 DIAGNOSIS — Z794 Long term (current) use of insulin: Secondary | ICD-10-CM | POA: Diagnosis not present

## 2024-05-07 DIAGNOSIS — R1313 Dysphagia, pharyngeal phase: Secondary | ICD-10-CM | POA: Diagnosis not present

## 2024-05-07 DIAGNOSIS — N183 Chronic kidney disease, stage 3 unspecified: Secondary | ICD-10-CM | POA: Diagnosis not present

## 2024-05-07 DIAGNOSIS — Z794 Long term (current) use of insulin: Secondary | ICD-10-CM | POA: Diagnosis not present

## 2024-05-07 DIAGNOSIS — E1122 Type 2 diabetes mellitus with diabetic chronic kidney disease: Secondary | ICD-10-CM | POA: Diagnosis not present

## 2024-05-07 DIAGNOSIS — I5042 Chronic combined systolic (congestive) and diastolic (congestive) heart failure: Secondary | ICD-10-CM | POA: Diagnosis not present

## 2024-05-07 DIAGNOSIS — I130 Hypertensive heart and chronic kidney disease with heart failure and stage 1 through stage 4 chronic kidney disease, or unspecified chronic kidney disease: Secondary | ICD-10-CM | POA: Diagnosis not present

## 2024-05-10 DIAGNOSIS — R338 Other retention of urine: Secondary | ICD-10-CM | POA: Diagnosis not present

## 2024-05-10 DIAGNOSIS — C61 Malignant neoplasm of prostate: Secondary | ICD-10-CM | POA: Diagnosis not present

## 2024-05-10 DIAGNOSIS — C7951 Secondary malignant neoplasm of bone: Secondary | ICD-10-CM | POA: Diagnosis not present

## 2024-05-15 DIAGNOSIS — N183 Chronic kidney disease, stage 3 unspecified: Secondary | ICD-10-CM | POA: Diagnosis not present

## 2024-05-15 DIAGNOSIS — I130 Hypertensive heart and chronic kidney disease with heart failure and stage 1 through stage 4 chronic kidney disease, or unspecified chronic kidney disease: Secondary | ICD-10-CM | POA: Diagnosis not present

## 2024-05-15 DIAGNOSIS — Z794 Long term (current) use of insulin: Secondary | ICD-10-CM | POA: Diagnosis not present

## 2024-05-15 DIAGNOSIS — I5042 Chronic combined systolic (congestive) and diastolic (congestive) heart failure: Secondary | ICD-10-CM | POA: Diagnosis not present

## 2024-05-15 DIAGNOSIS — R1313 Dysphagia, pharyngeal phase: Secondary | ICD-10-CM | POA: Diagnosis not present

## 2024-05-15 DIAGNOSIS — E1122 Type 2 diabetes mellitus with diabetic chronic kidney disease: Secondary | ICD-10-CM | POA: Diagnosis not present

## 2024-05-17 ENCOUNTER — Inpatient Hospital Stay: Admitting: Physician Assistant

## 2024-05-17 ENCOUNTER — Inpatient Hospital Stay

## 2024-05-19 DIAGNOSIS — Z85828 Personal history of other malignant neoplasm of skin: Secondary | ICD-10-CM | POA: Diagnosis not present

## 2024-05-19 DIAGNOSIS — J4489 Other specified chronic obstructive pulmonary disease: Secondary | ICD-10-CM | POA: Diagnosis not present

## 2024-05-19 DIAGNOSIS — D508 Other iron deficiency anemias: Secondary | ICD-10-CM | POA: Diagnosis not present

## 2024-05-19 DIAGNOSIS — E669 Obesity, unspecified: Secondary | ICD-10-CM | POA: Diagnosis not present

## 2024-05-19 DIAGNOSIS — Z794 Long term (current) use of insulin: Secondary | ICD-10-CM | POA: Diagnosis not present

## 2024-05-19 DIAGNOSIS — Z6824 Body mass index (BMI) 24.0-24.9, adult: Secondary | ICD-10-CM | POA: Diagnosis not present

## 2024-05-19 DIAGNOSIS — F0280 Dementia in other diseases classified elsewhere without behavioral disturbance: Secondary | ICD-10-CM | POA: Diagnosis not present

## 2024-05-19 DIAGNOSIS — C7951 Secondary malignant neoplasm of bone: Secondary | ICD-10-CM | POA: Diagnosis not present

## 2024-05-19 DIAGNOSIS — Z556 Problems related to health literacy: Secondary | ICD-10-CM | POA: Diagnosis not present

## 2024-05-19 DIAGNOSIS — E785 Hyperlipidemia, unspecified: Secondary | ICD-10-CM | POA: Diagnosis not present

## 2024-05-19 DIAGNOSIS — G301 Alzheimer's disease with late onset: Secondary | ICD-10-CM | POA: Diagnosis not present

## 2024-05-19 DIAGNOSIS — D539 Nutritional anemia, unspecified: Secondary | ICD-10-CM | POA: Diagnosis not present

## 2024-05-19 DIAGNOSIS — I872 Venous insufficiency (chronic) (peripheral): Secondary | ICD-10-CM | POA: Diagnosis not present

## 2024-05-19 DIAGNOSIS — E1122 Type 2 diabetes mellitus with diabetic chronic kidney disease: Secondary | ICD-10-CM | POA: Diagnosis not present

## 2024-05-19 DIAGNOSIS — N183 Chronic kidney disease, stage 3 unspecified: Secondary | ICD-10-CM | POA: Diagnosis not present

## 2024-05-19 DIAGNOSIS — N138 Other obstructive and reflux uropathy: Secondary | ICD-10-CM | POA: Diagnosis not present

## 2024-05-19 DIAGNOSIS — I5042 Chronic combined systolic (congestive) and diastolic (congestive) heart failure: Secondary | ICD-10-CM | POA: Diagnosis not present

## 2024-05-19 DIAGNOSIS — C61 Malignant neoplasm of prostate: Secondary | ICD-10-CM | POA: Diagnosis not present

## 2024-05-19 DIAGNOSIS — Z8616 Personal history of COVID-19: Secondary | ICD-10-CM | POA: Diagnosis not present

## 2024-05-19 DIAGNOSIS — R1313 Dysphagia, pharyngeal phase: Secondary | ICD-10-CM | POA: Diagnosis not present

## 2024-05-19 DIAGNOSIS — E538 Deficiency of other specified B group vitamins: Secondary | ICD-10-CM | POA: Diagnosis not present

## 2024-05-19 DIAGNOSIS — H903 Sensorineural hearing loss, bilateral: Secondary | ICD-10-CM | POA: Diagnosis not present

## 2024-05-19 DIAGNOSIS — I130 Hypertensive heart and chronic kidney disease with heart failure and stage 1 through stage 4 chronic kidney disease, or unspecified chronic kidney disease: Secondary | ICD-10-CM | POA: Diagnosis not present

## 2024-05-19 DIAGNOSIS — N401 Enlarged prostate with lower urinary tract symptoms: Secondary | ICD-10-CM | POA: Diagnosis not present

## 2024-05-19 DIAGNOSIS — M48061 Spinal stenosis, lumbar region without neurogenic claudication: Secondary | ICD-10-CM | POA: Diagnosis not present

## 2024-05-20 ENCOUNTER — Other Ambulatory Visit: Payer: Self-pay

## 2024-05-20 DIAGNOSIS — C61 Malignant neoplasm of prostate: Secondary | ICD-10-CM

## 2024-05-21 ENCOUNTER — Inpatient Hospital Stay

## 2024-05-21 ENCOUNTER — Inpatient Hospital Stay (HOSPITAL_BASED_OUTPATIENT_CLINIC_OR_DEPARTMENT_OTHER): Admitting: Hematology

## 2024-05-21 ENCOUNTER — Inpatient Hospital Stay: Attending: Hematology

## 2024-05-21 VITALS — BP 101/65 | HR 98 | Temp 97.7°F | Resp 18 | Wt 163.5 lb

## 2024-05-21 DIAGNOSIS — C7951 Secondary malignant neoplasm of bone: Secondary | ICD-10-CM

## 2024-05-21 DIAGNOSIS — Z79899 Other long term (current) drug therapy: Secondary | ICD-10-CM | POA: Insufficient documentation

## 2024-05-21 DIAGNOSIS — D649 Anemia, unspecified: Secondary | ICD-10-CM

## 2024-05-21 DIAGNOSIS — C61 Malignant neoplasm of prostate: Secondary | ICD-10-CM | POA: Diagnosis not present

## 2024-05-21 LAB — CMP (CANCER CENTER ONLY)
ALT: 7 U/L (ref 0–44)
AST: 11 U/L — ABNORMAL LOW (ref 15–41)
Albumin: 3.1 g/dL — ABNORMAL LOW (ref 3.5–5.0)
Alkaline Phosphatase: 149 U/L — ABNORMAL HIGH (ref 38–126)
Anion gap: 4 — ABNORMAL LOW (ref 5–15)
BUN: 20 mg/dL (ref 8–23)
CO2: 27 mmol/L (ref 22–32)
Calcium: 8.7 mg/dL — ABNORMAL LOW (ref 8.9–10.3)
Chloride: 109 mmol/L (ref 98–111)
Creatinine: 1.42 mg/dL — ABNORMAL HIGH (ref 0.61–1.24)
GFR, Estimated: 46 mL/min — ABNORMAL LOW (ref >60)
Glucose, Bld: 236 mg/dL — ABNORMAL HIGH (ref 70–99)
Potassium: 5.4 mmol/L — ABNORMAL HIGH (ref 3.5–5.1)
Sodium: 140 mmol/L (ref 135–145)
Total Bilirubin: 0.2 mg/dL (ref 0.0–1.2)
Total Protein: 5.9 g/dL — ABNORMAL LOW (ref 6.5–8.1)

## 2024-05-21 LAB — CBC WITH DIFFERENTIAL (CANCER CENTER ONLY)
Abs Immature Granulocytes: 0.04 K/uL (ref 0.00–0.07)
Basophils Absolute: 0 K/uL (ref 0.0–0.1)
Basophils Relative: 0 %
Eosinophils Absolute: 0.3 K/uL (ref 0.0–0.5)
Eosinophils Relative: 3 %
HCT: 36 % — ABNORMAL LOW (ref 39.0–52.0)
Hemoglobin: 11.6 g/dL — ABNORMAL LOW (ref 13.0–17.0)
Immature Granulocytes: 0 %
Lymphocytes Relative: 26 %
Lymphs Abs: 2.7 K/uL (ref 0.7–4.0)
MCH: 29.1 pg (ref 26.0–34.0)
MCHC: 32.2 g/dL (ref 30.0–36.0)
MCV: 90.5 fL (ref 80.0–100.0)
Monocytes Absolute: 0.9 K/uL (ref 0.1–1.0)
Monocytes Relative: 8 %
Neutro Abs: 6.5 K/uL (ref 1.7–7.7)
Neutrophils Relative %: 63 %
Platelet Count: 235 K/uL (ref 150–400)
RBC: 3.98 MIL/uL — ABNORMAL LOW (ref 4.22–5.81)
RDW: 15.9 % — ABNORMAL HIGH (ref 11.5–15.5)
WBC Count: 10.5 K/uL (ref 4.0–10.5)
nRBC: 0 % (ref 0.0–0.2)

## 2024-05-21 MED ORDER — LEUPROLIDE ACETATE (3 MONTH) 22.5 MG ~~LOC~~ KIT
22.5000 mg | PACK | Freq: Once | SUBCUTANEOUS | Status: AC
  Administered 2024-05-21: 22.5 mg via SUBCUTANEOUS
  Filled 2024-05-21: qty 22.5

## 2024-05-22 LAB — PSA, TOTAL AND FREE
PSA, Free Pct: 65.7 %
PSA, Free: 5.45 ng/mL
Prostate Specific Ag, Serum: 8.3 ng/mL — ABNORMAL HIGH (ref 0.0–4.0)

## 2024-05-23 DIAGNOSIS — E1122 Type 2 diabetes mellitus with diabetic chronic kidney disease: Secondary | ICD-10-CM | POA: Diagnosis not present

## 2024-05-23 DIAGNOSIS — I130 Hypertensive heart and chronic kidney disease with heart failure and stage 1 through stage 4 chronic kidney disease, or unspecified chronic kidney disease: Secondary | ICD-10-CM | POA: Diagnosis not present

## 2024-05-23 DIAGNOSIS — Z794 Long term (current) use of insulin: Secondary | ICD-10-CM | POA: Diagnosis not present

## 2024-05-23 DIAGNOSIS — G301 Alzheimer's disease with late onset: Secondary | ICD-10-CM | POA: Diagnosis not present

## 2024-05-23 DIAGNOSIS — N183 Chronic kidney disease, stage 3 unspecified: Secondary | ICD-10-CM | POA: Diagnosis not present

## 2024-05-23 DIAGNOSIS — I5042 Chronic combined systolic (congestive) and diastolic (congestive) heart failure: Secondary | ICD-10-CM | POA: Diagnosis not present

## 2024-05-28 DIAGNOSIS — I5042 Chronic combined systolic (congestive) and diastolic (congestive) heart failure: Secondary | ICD-10-CM | POA: Diagnosis not present

## 2024-05-28 DIAGNOSIS — I130 Hypertensive heart and chronic kidney disease with heart failure and stage 1 through stage 4 chronic kidney disease, or unspecified chronic kidney disease: Secondary | ICD-10-CM | POA: Diagnosis not present

## 2024-05-28 DIAGNOSIS — G301 Alzheimer's disease with late onset: Secondary | ICD-10-CM | POA: Diagnosis not present

## 2024-05-28 DIAGNOSIS — Z794 Long term (current) use of insulin: Secondary | ICD-10-CM | POA: Diagnosis not present

## 2024-05-28 DIAGNOSIS — N183 Chronic kidney disease, stage 3 unspecified: Secondary | ICD-10-CM | POA: Diagnosis not present

## 2024-05-28 DIAGNOSIS — E1122 Type 2 diabetes mellitus with diabetic chronic kidney disease: Secondary | ICD-10-CM | POA: Diagnosis not present

## 2024-05-29 ENCOUNTER — Encounter: Payer: Self-pay | Admitting: Hematology

## 2024-05-29 DIAGNOSIS — G301 Alzheimer's disease with late onset: Secondary | ICD-10-CM | POA: Diagnosis not present

## 2024-05-29 DIAGNOSIS — F02B0 Dementia in other diseases classified elsewhere, moderate, without behavioral disturbance, psychotic disturbance, mood disturbance, and anxiety: Secondary | ICD-10-CM | POA: Diagnosis not present

## 2024-05-29 NOTE — Progress Notes (Signed)
 HEMATOLOGY/ONCOLOGY CLINIC NOTE  Date of Service: .05/21/2024  Patient Care Team: [facility], [facility] as PCP - General Dann Candyce RAMAN, MD as PCP - Cardiology (Cardiology) Szabat, Toribio BROCKS, [facility] (Inactive) as Pharmacist (Pharmacist) Jarold Mayo, MD as Consulting Physician (Ophthalmology) Onesimo Emaline Brink, MD as Consulting Physician (Hematology)  CHIEF COMPLAINTS/PURPOSE OF CONSULTATION:  Follow-up for continued valuation and management of poorly differentiated prostate cancer  HISTORY OF PRESENTING ILLNESS:  Nicholas Burton is a wonderful 89 y.o. male who has been referred to us  by Dr Nilda Lands MD for evaluation and management of metastatic poorly differentiated prostate cancer with oligometastatic disease to the L3.   He has a history of hypertension, chronic diastolic heart failure, dyslipidemia, diabetes type 2, dementia and was admitted to [facility] with acute encephalopathy 10/05/2022.   Patient had a CT of the head which showed no acute intracranial findings CT of the lumbar spine was done which showed a lytic destructive pattern involving L2-L3 and L4 vertebral bodies concerning for possible discitis versus osteomyelitis versus tumor.   MRI lumbar spine on 10/05/2022 showed Findings concerning for osteomyelitis discitis at the L3-4 level. Abnormal enhancing material within the ventral epidural space at the level of L3, concerning for epidural phlegmon. These changes superimposed on underlying spondylosis and facet arthrosis resultant severe spinal stenosis. While these changes are favored to reflect infection, a possible neoplastic process involving the L3 vertebral body remains difficult to exclude. Mccole interval follow-up imaging may be helpful for further evaluation as warranted.  This was treated with empiric antibiotics and is being followed by infectious disease. Patient subsequently underwent a biopsy of L3 10/07/2022 which showed metastatic  poorly differentiated prostate cancer.   Patient subsequently had a CT of the chest abdomen pelvis without contrast at our recommendation which was done on 10/11/2022 and showed no evidence of metastatic disease in the chest abdomen or pelvis.  Diffuse bladder wall thickening with surrounding inflammatory stranding worrisome for cystitis.  Stable abnormal appearance of L3 and nonspecific presacral edema.  INTERVAL HISTORY  Nicholas Burton is a wonderful 89 y.o. male who is here for follow-up for continued valuation and management of his metastatic prostate cancer. He is accompanied by his son. He missed his last Lupron  injection about 3 months ago due to confusion issues and issues with recurrent urinary tract infections. He recently got new hearing aids which he notes worked well. No other acute new focal symptoms. Has been taking his Xtandi  regularly. No new back pains or bone pains.  MEDICAL HISTORY:  Past Medical History:  Diagnosis Date   Allergy    ANEMIA ASSOCIATED W/OTHER [facility] NUTRITIONAL DEFIC 08/11/2010   Qualifier: Diagnosis of  By: Joshua MD, Debby CROME.    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    History of skin cancer    Hyperlipidemia with target LDL less than 100 11/10/2008        Hypertension    Memory loss 05/14/2009   Obesity (BMI 30.0-34.9) 04/01/2013   Osteoarthritis    PSA elevation 10/25/2018   SKIN CANCER, HX OF 11/10/2008  Qualifier: Diagnosis of  By: Joshua MD, Debby CROME.    Snoring 04/03/2014   Type II diabetes mellitus with manifestations (HCC) 11/10/2008   Estimated Creatinine Clearance: 38 mL/min (A) (by C-G formula based  on SCr of 1.52 mg/dL (H)).    Venous stasis dermatitis of both lower extremities 01/29/2018   Vitamin D  deficiency 11/10/2008    SURGICAL HISTORY: Past Surgical History:  Procedure Laterality Date   CHOLECYSTECTOMY     INGUINAL HERNIA REPAIR     x 3   IR FLUORO GUIDED NEEDLE PLC ASPIRATION/INJECTION LOC  10/07/2022   JOINT REPLACEMENT     KNEE SURGERY     x 2   MELANOMA EXCISION     x 3 -- Left arm   TOTAL KNEE ARTHROPLASTY     x 3    SOCIAL HISTORY: Social History   Socioeconomic History   Marital status: Married    Spouse name: Candis   Number of children: 4   Years of education: Not on file   Highest education level: Not on file  Occupational History   Occupation: retired    Associate Professor: RETIRED  Tobacco Use   Smoking status: Never   Smokeless tobacco: Never  Vaping Use   Vaping status: Never Used  Substance and Sexual Activity   Alcohol use: No   Drug use: No   Sexual activity: Yes    Birth control/protection: None  Other Topics Concern   Not on file  Social History Narrative   No regular exercise   Lives with wife.     Social Drivers of Corporate investment banker Strain: Low Risk  (07/13/2023)   Overall Financial Resource Strain (CARDIA)    Difficulty of Paying Living Expenses: Not hard at all  Food Insecurity: No Food Insecurity (07/19/2023)   Hunger Vital Sign    Worried About Running Out of Food in the Last Year: Never true    Ran Out of Food in the Last Year: Never true  Transportation Needs: No Transportation Needs (07/19/2023)   PRAPARE - Administrator, Civil Service (Medical): No    Lack of Transportation (Non-Medical): No  Physical Activity: Sufficiently Active (07/13/2023)   Exercise Vital Sign    Days of Exercise per Week: 6 days    Minutes of Exercise per Session: 30 min  Stress: Patient Unable To Answer (07/13/2023)   [employer] [employer] - Occupational Stress Questionnaire    Feeling of Stress : Patient  unable to answer  Social Connections: Moderately Isolated (07/13/2023)   Social Connection and Isolation Panel    Frequency of Communication with Friends and Family: Never    Frequency of Social Gatherings with Friends and Family: Never    Attends Religious Services: Never    Database administrator or Organizations: Yes    Attends Banker Meetings: Never    Marital Status: Married  Catering manager Violence: Not At Risk (07/19/2023)   Humiliation, Afraid, Rape, and Kick questionnaire    Fear of Current or Ex-Partner: No    Emotionally Abused: No    Physically Abused: No    Sexually Abused: No    FAMILY HISTORY: Family History  Problem Relation Age of Onset   Heart disease Mother    Diabetes Mother    Heart disease Father    Cancer Sister    Cancer Brother    Diabetes Son    Diabetes Other     ALLERGIES:  is allergic to  ace inhibitors, oxycodone, and penicillins.  MEDICATIONS:  Current Outpatient Medications  Medication Sig Dispense Refill   acetaminophen  (TYLENOL ) 325 MG tablet Take 1-2 tablets (325-650 mg total) by mouth every 6 (six) hours as needed for mild pain.     BD AUTOSHIELD DUO 30G X 5 MM MISC USE AS DIRECTED TO ADMINISTER INSULIN  100 each 0   cholecalciferol  (VITAMIN D ) 1000 units tablet Take 1 tablet (1,000 Units total) by mouth daily. 30 tablet 0   Colesevelam  HCl 3.75 g PACK Take 1 packet by mouth daily. 60 packet 1   cyanocobalamin  (VITAMIN B12) 500 MCG tablet Take 1 tablet (500 mcg total) by mouth daily. 30 tablet 0   enzalutamide  (XTANDI ) 80 MG tablet Take 1 tablet (80 mg total) by mouth daily. 30 tablet 11   LANTUS  SOLOSTAR 100 UNIT/ML Solostar Pen ADMINISTER 23 UNITS UNDER THE SKIN AT BEDTIME (Patient taking differently: Inject 23 Units into the skin at bedtime.) 21 mL 0   magnesium  gluconate (MAGONATE) 500 MG tablet Take 1 tablet (500 mg total) by mouth at bedtime. 30 tablet 0   melatonin 3 MG TABS tablet Take 1 tablet (3 mg total) by mouth  at bedtime as needed. 30 tablet 0   metoprolol  tartrate (LOPRESSOR ) 25 MG tablet TAKE 1/2 TABLET BY MOUTH TWICE DAILY 90 tablet 0   Multiple Vitamins-Minerals (MULTIVITAL PO) Take 1 tablet by mouth 2 (two) times daily.     Potassium Chloride  ER 20 MEQ TBCR Take 1 tablet by mouth 3 (three) times daily.     tamsulosin  (FLOMAX ) 0.4 MG CAPS capsule TAKE 1 CAPSULE(0.4 MG) BY MOUTH DAILY 90 capsule 1   torsemide  (DEMADEX ) 10 MG tablet Take 1 tablet (10 mg total) by mouth every other day. 45 tablet 2   Vibegron  (GEMTESA ) 75 MG TABS Take 1 tablet (75 mg total) by mouth daily. 90 tablet 0   No current facility-administered medications for this visit.    REVIEW OF SYSTEMS:    10 Point review of Systems was done is negative except as noted above.  PHYSICAL EXAMINATION: ECOG PERFORMANCE STATUS: 2 - Symptomatic, <50% confined to bed  . Vitals:   05/21/24 1400  BP: 101/65  Pulse: 98  Resp: 18  Temp: 97.7 F (36.5 C)  SpO2: 97%   Filed Weights   05/21/24 1400  Weight: 163 lb 8 oz (74.2 kg)  .Body mass index is 24.86 kg/m.  NAD GENERAL:alert, in no acute distress and comfortable SKIN: no acute rashes, no significant lesions EYES: conjunctiva are pink and non-injected, sclera anicteric OROPHARYNX: MMM, no exudates, no oropharyngeal erythema or ulceration NECK: supple, no JVD LYMPH:  no palpable lymphadenopathy in the cervical, axillary or inguinal regions LUNGS: clear to auscultation b/l with normal respiratory effort HEART: regular rate & rhythm ABDOMEN:  normoactive bowel sounds , non tender, not distended. Extremity: no pedal edema PSYCH: alert & oriented x 3 with fluent speech NEURO: no focal motor/sensory deficits   LABORATORY DATA:  I have reviewed the data as listed .    Latest Ref Rng & Units 05/21/2024    1:46 PM 01/15/2024    7:20 AM 01/14/2024   10:33 PM  CBC  WBC 4.0 - 10.5 K/uL 10.5  9.5  8.3   Hemoglobin 13.0 - 17.0 g/dL 88.3  87.6  87.9   Hematocrit 39.0 - 52.0  % 36.0  39.1  38.0   Platelets 150 - 400 K/uL 235  179  194    .  Latest Ref Rng & Units 05/21/2024    1:46 PM 01/18/2024    6:08 AM 01/17/2024    6:14 AM  CMP  Glucose 70 - 99 mg/dL 763  826  91   BUN 8 - 23 mg/dL 20  22  23    Creatinine 0.61 - 1.24 mg/dL 8.57  8.54  8.54   Sodium 135 - 145 mmol/L 140  138  139   Potassium 3.5 - 5.1 mmol/L 5.4  4.0  3.8   Chloride 98 - 111 mmol/L 109  108  106   CO2 22 - 32 mmol/L 27  20  24    Calcium  8.9 - 10.3 mg/dL 8.7  8.8  8.8   Total Protein 6.5 - 8.1 g/dL 5.9     Total Bilirubin 0.0 - 1.2 mg/dL 0.2     Alkaline Phos 38 - 126 U/L 149     AST 15 - 41 U/L 11     ALT 0 - 44 U/L 7      PSA 8.3 up from 1.5  RADIOGRAPHIC STUDIES: I have personally reviewed the radiological images as listed and agreed with the findings in the report. No results found.   ASSESSMENT & PLAN:   89 year old male with no previous history of malignancy.  Previous history of hypertension, dyslipidemia, diabetes, CHF dementia though alert oriented x 3 at baseline admitted with altered mental status which is now more or less cleared significant new back pain.   #1 Pathologic L3 compression due to bone metastases from metastatic poorly differentiated prostate cancer.  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 Cannot rule out additional infection.  No cultures were sent from his L3 biopsy. PSA levels are elevated at 55.1 CT chest abdomen pelvis did not show any other sites of overt metastatic disease at this time or overt prostate lesions.   #2 history of dementia though apparently alert oriented x 3 at baseline with poor cognition.   #3 hypertension, dyslipidemia, diabetes, CHF.  #4 chronic urinary retention with chronic indwelling catheter with history of UTIs PLAN:  - Patient is here for continued evaluation and management of his prostate cancer -Labs done today were discussed with him and his son in  details CBC is stable with a hemoglobin of 11.6 normal WBC count and platelets CMP shows some mild hyperkalemia with potassium of 5.4 elevated glucose level of 236 stable chronic kidney disease with a creatinine of 1.42 and elevated alkaline phosphatase of 149 He missed his last Lupron  shot 3 months ago.  But has been taking his Xtandi  at 80 mg p.o. daily without any toxicities. PSA increased to 8.3 up from 1.5 in the context of having missed his Lupron  shot. - Next Lupron  shot due today and every 3 months -He will continue his Xtandi  80 mg p.o. daily -No new focal symptoms at this time. Recommend continuing his vitamin D  replacement. If his next PSA in 3 months continues to increase we will need to consider getting PSMA PET scan.  FOLLOW-UP: Plz schedule next 2 doses of Lupron  every 3 months per orders RTC with Dr Onesimo with labs with next dose of lupron  in 3 months  The total time spent in the appointment was 30 minutes*.  All of the patient's questions were answered with apparent satisfaction. The patient knows to call the clinic with any problems, questions or concerns.   Emaline Onesimo MD MS AAHIVMS [facility] Hematology/Oncology Physician [facility]  .*Total Encounter  Time as defined by the Centers for Medicare and Medicaid Services includes, in addition to the face-to-face time of a patient visit (documented in the note above) non-face-to-face time: obtaining and reviewing outside history, ordering and reviewing medications, tests or procedures, care coordination (communications with other health care professionals or caregivers) and documentation in the medical record.

## 2024-06-04 DIAGNOSIS — Z794 Long term (current) use of insulin: Secondary | ICD-10-CM | POA: Diagnosis not present

## 2024-06-04 DIAGNOSIS — G301 Alzheimer's disease with late onset: Secondary | ICD-10-CM | POA: Diagnosis not present

## 2024-06-04 DIAGNOSIS — I5042 Chronic combined systolic (congestive) and diastolic (congestive) heart failure: Secondary | ICD-10-CM | POA: Diagnosis not present

## 2024-06-04 DIAGNOSIS — E1122 Type 2 diabetes mellitus with diabetic chronic kidney disease: Secondary | ICD-10-CM | POA: Diagnosis not present

## 2024-06-04 DIAGNOSIS — I130 Hypertensive heart and chronic kidney disease with heart failure and stage 1 through stage 4 chronic kidney disease, or unspecified chronic kidney disease: Secondary | ICD-10-CM | POA: Diagnosis not present

## 2024-06-04 DIAGNOSIS — N183 Chronic kidney disease, stage 3 unspecified: Secondary | ICD-10-CM | POA: Diagnosis not present

## 2024-06-07 ENCOUNTER — Other Ambulatory Visit: Payer: Self-pay

## 2024-06-10 DIAGNOSIS — R399 Unspecified symptoms and signs involving the genitourinary system: Secondary | ICD-10-CM | POA: Diagnosis not present

## 2024-06-18 ENCOUNTER — Other Ambulatory Visit: Payer: Self-pay

## 2024-06-19 DIAGNOSIS — E119 Type 2 diabetes mellitus without complications: Secondary | ICD-10-CM | POA: Diagnosis not present

## 2024-06-19 DIAGNOSIS — D649 Anemia, unspecified: Secondary | ICD-10-CM | POA: Diagnosis not present

## 2024-06-20 ENCOUNTER — Other Ambulatory Visit: Payer: Self-pay

## 2024-06-24 ENCOUNTER — Other Ambulatory Visit: Payer: Self-pay

## 2024-06-26 ENCOUNTER — Other Ambulatory Visit: Payer: Self-pay

## 2024-06-26 ENCOUNTER — Other Ambulatory Visit: Payer: Self-pay | Admitting: Internal Medicine

## 2024-06-26 DIAGNOSIS — C61 Malignant neoplasm of prostate: Secondary | ICD-10-CM

## 2024-06-26 DIAGNOSIS — N3281 Overactive bladder: Secondary | ICD-10-CM

## 2024-06-26 DIAGNOSIS — E785 Hyperlipidemia, unspecified: Secondary | ICD-10-CM

## 2024-06-26 DIAGNOSIS — I10 Essential (primary) hypertension: Secondary | ICD-10-CM

## 2024-06-26 DIAGNOSIS — N400 Enlarged prostate without lower urinary tract symptoms: Secondary | ICD-10-CM

## 2024-06-26 DIAGNOSIS — E119 Type 2 diabetes mellitus without complications: Secondary | ICD-10-CM

## 2024-06-26 NOTE — Telephone Encounter (Signed)
 Copied from CRM [id number]. Topic: Clinical - Medication Refill >> Jun 26, 2024  2:39 PM Sophia H wrote: Medication:  **ALL MEDICATIONS ON LIST PER PHARMACY from PCP  acetaminophen  (TYLENOL ) 325 MG tablet BD AUTOSHIELD DUO 30G X 5 MM MISC cholecalciferol  (VITAMIN D ) 1000 units tablet Colesevelam  HCl 3.75 g PACK cyanocobalamin  (VITAMIN B12) 500 MCG tablet enzalutamide  (XTANDI ) 80 MG tablet LANTUS  SOLOSTAR 100 UNIT/ML Solostar Pen magnesium  gluconate (MAGONATE) 500 MG tablet melatonin 3 MG TABS tablet metoprolol  tartrate (LOPRESSOR ) 25 MG tablet Multiple Vitamins-Minerals (MULTIVITAL PO) Potassium Chloride  ER 20 MEQ TBCR tamsulosin  (FLOMAX ) 0.4 MG CAPS capsule torsemide  (DEMADEX ) 10 MG tablet Vibegron  (GEMTESA ) 75 MG TABS    Has the patient contacted their pharmacy? Yes, pharmacy calling on behalf of patient. Needing all new RX's because patient is requesting a medication adherence pack.   This is the patient's preferred pharmacy:    [facility] [phone] [address] [place] [address]  Is this the correct pharmacy for this prescription? Yes If no, delete pharmacy and type the correct one.   Has the prescription been filled recently? Yes  Is the patient out of the medication? Yes  Has the patient been seen for an appointment in the last year OR does the patient have an upcoming appointment? Yes  Can we respond through MyChart? Yes  Agent: Please be advised that Rx refills may take up to 3 business days. We ask that you follow-up with your pharmacy.

## 2024-06-27 ENCOUNTER — Telehealth: Payer: Self-pay

## 2024-06-27 NOTE — Telephone Encounter (Signed)
 Copied from CRM [id number]. Topic: Clinical - Medication Question >> Jun 24, 2024  1:47 PM Deaijah H wrote: Reason for CRM: [facility] stated patient is needing acetaminophen  (TYLENOL ) 325 MG tablet/LANTUS  SOLOSTAR 100 UNIT/ML Solostar Pen/melatonin 3 MG TABS tablet/metoprolol  tartrate (LOPRESSOR ) 25 MG tablet/ Potassium Chloride  ER 20 MEQ TBCR/tamsulosin  (FLOMAX ) 0.4 MG CAPS capsule/torsemide  (DEMADEX ) 10 MG tablet/Colesevelam  HCl 3.75 g PACK/enzalutamide  (XTANDI ) 80 MG tablet/ VIT B & D/magnesium  gluconate (MAGONATE) 500 MG tablet

## 2024-06-28 ENCOUNTER — Other Ambulatory Visit (HOSPITAL_BASED_OUTPATIENT_CLINIC_OR_DEPARTMENT_OTHER): Payer: Self-pay

## 2024-06-28 ENCOUNTER — Other Ambulatory Visit: Payer: Self-pay

## 2024-06-28 NOTE — Telephone Encounter (Signed)
 Is this the patient that moved to [place] with his wife and his son ?

## 2024-06-28 NOTE — Telephone Encounter (Signed)
 Unable to reach patient. LMTRC

## 2024-07-01 ENCOUNTER — Other Ambulatory Visit: Payer: Self-pay

## 2024-07-01 ENCOUNTER — Other Ambulatory Visit (HOSPITAL_COMMUNITY): Payer: Self-pay

## 2024-07-02 DIAGNOSIS — E1100 Type 2 diabetes mellitus with hyperosmolarity without nonketotic hyperglycemic-hyperosmolar coma (NKHHC): Secondary | ICD-10-CM | POA: Diagnosis not present

## 2024-07-04 ENCOUNTER — Other Ambulatory Visit: Payer: Self-pay

## 2024-07-04 ENCOUNTER — Other Ambulatory Visit (HOSPITAL_COMMUNITY): Payer: Self-pay

## 2024-07-05 ENCOUNTER — Other Ambulatory Visit: Payer: Self-pay

## 2024-07-08 ENCOUNTER — Other Ambulatory Visit (HOSPITAL_COMMUNITY): Payer: Self-pay

## 2024-07-08 ENCOUNTER — Encounter: Payer: Self-pay | Admitting: Hematology

## 2024-07-08 ENCOUNTER — Other Ambulatory Visit: Payer: Self-pay

## 2024-07-08 ENCOUNTER — Encounter (HOSPITAL_COMMUNITY): Payer: Self-pay

## 2024-07-08 DIAGNOSIS — E1100 Type 2 diabetes mellitus with hyperosmolarity without nonketotic hyperglycemic-hyperosmolar coma (NKHHC): Secondary | ICD-10-CM | POA: Diagnosis not present

## 2024-07-08 MED ORDER — VITAMIN B-12 500 MCG PO TABS
500.0000 ug | ORAL_TABLET | Freq: Every day | ORAL | 11 refills | Status: AC
  Filled 2024-07-08: qty 100, 100d supply, fill #0
  Filled 2024-07-09: qty 30, 30d supply, fill #0
  Filled 2024-07-30 – 2024-08-01 (×2): qty 30, 30d supply, fill #1
  Filled 2024-09-01: qty 30, 30d supply, fill #2
  Filled 2024-09-30: qty 30, 30d supply, fill #3

## 2024-07-08 MED ORDER — MAGNESIUM GLUCONATE 500 (27 MG) MG PO TABS
500.0000 mg | ORAL_TABLET | Freq: Every day | ORAL | 11 refills | Status: AC
  Filled 2024-07-08 – 2024-07-09 (×2): qty 30, 30d supply, fill #0
  Filled 2024-07-30 – 2024-08-01 (×2): qty 30, 30d supply, fill #1
  Filled 2024-09-01: qty 30, 30d supply, fill #2
  Filled 2024-09-30: qty 30, 30d supply, fill #3

## 2024-07-08 MED ORDER — PRESERVISION AREDS 2 PO CAPS
1.0000 | ORAL_CAPSULE | Freq: Two times a day (BID) | ORAL | 11 refills | Status: DC
  Filled 2024-07-08 – 2024-07-09 (×2): qty 60, 30d supply, fill #0
  Filled 2024-07-30 – 2024-08-01 (×2): qty 60, 30d supply, fill #1

## 2024-07-08 MED ORDER — XTANDI 80 MG PO TABS
80.0000 mg | ORAL_TABLET | Freq: Every day | ORAL | 11 refills | Status: DC
  Filled ????-??-??: fill #0

## 2024-07-08 MED ORDER — TORSEMIDE 10 MG PO TABS
10.0000 mg | ORAL_TABLET | ORAL | 11 refills | Status: AC
  Filled 2024-07-08 – 2024-07-09 (×3): qty 15, 30d supply, fill #0
  Filled 2024-07-30: qty 15, 30d supply, fill #1
  Filled 2024-09-01 – 2024-09-09 (×4): qty 15, 30d supply, fill #2
  Filled 2024-10-03 (×2): qty 15, 30d supply, fill #3

## 2024-07-08 MED ORDER — MELATONIN 3 MG PO TABS
3.0000 mg | ORAL_TABLET | Freq: Every evening | ORAL | 11 refills | Status: AC
  Filled 2024-07-08 – 2024-07-09 (×2): qty 60, 60d supply, fill #0
  Filled 2024-07-30: qty 60, 60d supply, fill #1
  Filled 2024-08-01: qty 30, 30d supply, fill #1
  Filled 2024-09-02: qty 30, 30d supply, fill #2

## 2024-07-08 MED ORDER — COLESEVELAM HCL 3.75 G PO PACK
3.7500 g | PACK | Freq: Every day | ORAL | 11 refills | Status: DC
  Filled 2024-07-08: qty 30, 30d supply, fill #0

## 2024-07-08 MED ORDER — TAMSULOSIN HCL 0.4 MG PO CAPS
0.4000 mg | ORAL_CAPSULE | Freq: Every day | ORAL | 11 refills | Status: AC
  Filled 2024-07-08 – 2024-07-09 (×3): qty 30, 30d supply, fill #0
  Filled 2024-07-30 – 2024-08-01 (×2): qty 30, 30d supply, fill #1
  Filled 2024-09-01: qty 30, 30d supply, fill #2
  Filled 2024-09-30: qty 30, 30d supply, fill #3

## 2024-07-08 MED ORDER — LANTUS SOLOSTAR 100 UNIT/ML ~~LOC~~ SOPN
15.0000 [IU] | PEN_INJECTOR | Freq: Every day | SUBCUTANEOUS | 11 refills | Status: AC
  Filled 2024-07-08 – 2024-08-08 (×2): qty 3, 20d supply, fill #0
  Filled 2024-08-26: qty 3, 20d supply, fill #1
  Filled 2024-09-10: qty 3, 20d supply, fill #2
  Filled 2024-09-30: qty 3, 20d supply, fill #3
  Filled ????-??-??: fill #4

## 2024-07-08 MED ORDER — INSULIN GLARGINE 100 UNIT/ML ~~LOC~~ SOLN: SUBCUTANEOUS | 11 refills | Status: DC

## 2024-07-08 MED ORDER — POTASSIUM CHLORIDE ER 20 MEQ PO TBCR
1.0000 | EXTENDED_RELEASE_TABLET | Freq: Three times a day (TID) | ORAL | 11 refills | Status: DC
  Filled 2024-07-08 – 2024-07-09 (×3): qty 90, 30d supply, fill #0
  Filled 2024-07-30 – 2024-08-01 (×2): qty 90, 30d supply, fill #1

## 2024-07-08 MED ORDER — VITAMIN D3 25 MCG (1000 UNIT) PO TABS
1000.0000 [IU] | ORAL_TABLET | Freq: Every day | ORAL | 11 refills | Status: AC
  Filled 2024-07-08 – 2024-07-09 (×2): qty 30, 30d supply, fill #0
  Filled 2024-07-30 – 2024-08-01 (×2): qty 30, 30d supply, fill #1
  Filled 2024-08-31: qty 30, 30d supply, fill #2
  Filled 2024-09-30: qty 30, 30d supply, fill #3

## 2024-07-08 MED ORDER — MULTIVITAMIN ADULTS 50+ PO TABS
1.0000 | ORAL_TABLET | Freq: Every day | ORAL | 11 refills | Status: AC
  Filled 2024-07-09: qty 30, 30d supply, fill #0
  Filled 2024-07-30 – 2024-08-01 (×3): qty 30, 30d supply, fill #1
  Filled 2024-08-31: qty 30, 30d supply, fill #2
  Filled 2024-09-30: qty 30, 30d supply, fill #3

## 2024-07-08 MED ORDER — METOPROLOL TARTRATE 25 MG PO TABS
ORAL_TABLET | ORAL | 11 refills | Status: DC
  Filled 2024-07-08 – 2024-07-09 (×3): qty 30, 30d supply, fill #0
  Filled 2024-07-30 – 2024-08-01 (×2): qty 30, 30d supply, fill #1

## 2024-07-08 NOTE — Progress Notes (Signed)
 Patient last filled with another pharmacy on 11/5. Insurance will pay again on 11/27

## 2024-07-09 ENCOUNTER — Encounter: Payer: Self-pay | Admitting: Hematology

## 2024-07-09 ENCOUNTER — Other Ambulatory Visit (HOSPITAL_COMMUNITY): Payer: Self-pay

## 2024-07-09 ENCOUNTER — Other Ambulatory Visit: Payer: Self-pay

## 2024-07-10 ENCOUNTER — Other Ambulatory Visit: Payer: Self-pay

## 2024-07-12 ENCOUNTER — Other Ambulatory Visit: Payer: Self-pay

## 2024-07-12 DIAGNOSIS — R338 Other retention of urine: Secondary | ICD-10-CM | POA: Diagnosis not present

## 2024-07-12 DIAGNOSIS — R339 Retention of urine, unspecified: Secondary | ICD-10-CM | POA: Diagnosis not present

## 2024-07-12 DIAGNOSIS — E1100 Type 2 diabetes mellitus with hyperosmolarity without nonketotic hyperglycemic-hyperosmolar coma (NKHHC): Secondary | ICD-10-CM | POA: Diagnosis not present

## 2024-07-15 ENCOUNTER — Other Ambulatory Visit: Payer: Self-pay

## 2024-07-16 ENCOUNTER — Other Ambulatory Visit: Payer: Self-pay

## 2024-07-18 ENCOUNTER — Other Ambulatory Visit: Payer: Self-pay

## 2024-07-21 DIAGNOSIS — F02B0 Dementia in other diseases classified elsewhere, moderate, without behavioral disturbance, psychotic disturbance, mood disturbance, and anxiety: Secondary | ICD-10-CM | POA: Diagnosis not present

## 2024-07-21 DIAGNOSIS — G301 Alzheimer's disease with late onset: Secondary | ICD-10-CM | POA: Diagnosis not present

## 2024-07-22 ENCOUNTER — Other Ambulatory Visit: Payer: Self-pay

## 2024-07-22 DIAGNOSIS — F02B0 Dementia in other diseases classified elsewhere, moderate, without behavioral disturbance, psychotic disturbance, mood disturbance, and anxiety: Secondary | ICD-10-CM | POA: Diagnosis not present

## 2024-07-22 DIAGNOSIS — G301 Alzheimer's disease with late onset: Secondary | ICD-10-CM | POA: Diagnosis not present

## 2024-07-23 ENCOUNTER — Other Ambulatory Visit: Payer: Self-pay

## 2024-07-23 DIAGNOSIS — N3281 Overactive bladder: Secondary | ICD-10-CM

## 2024-07-23 DIAGNOSIS — F02B0 Dementia in other diseases classified elsewhere, moderate, without behavioral disturbance, psychotic disturbance, mood disturbance, and anxiety: Secondary | ICD-10-CM | POA: Diagnosis not present

## 2024-07-23 DIAGNOSIS — G301 Alzheimer's disease with late onset: Secondary | ICD-10-CM | POA: Diagnosis not present

## 2024-07-23 NOTE — Telephone Encounter (Signed)
 Copied from CRM #[id number]. Topic: Clinical - Medication Refill >> Jul 23, 2024  9:42 AM Nessti S wrote: Medication: Vibegron  (GEMTESA ) 75 MG TABS   Has the patient contacted their pharmacy? Yes (Agent: If no, request that the patient contact the pharmacy for the refill. If patient does not wish to contact the pharmacy document the reason why and proceed with request.) (Agent: If yes, when and what did the pharmacy advise?)  This is the patient's preferred pharmacy:  [place] [facility] [address] [place] [address] Phone: [phone] Fax: [phone]  Is this the correct pharmacy for this prescription? Yes If no, delete pharmacy and type the correct one.   Has the prescription been filled recently? No  Is the patient out of the medication? Yes  Has the patient been seen for an appointment in the last year OR does the patient have an upcoming appointment? No  Can we respond through MyChart? No  Agent: Please be advised that Rx refills may take up to 3 business days. We ask that you follow-up with your pharmacy.

## 2024-07-24 ENCOUNTER — Other Ambulatory Visit: Payer: Self-pay

## 2024-07-24 NOTE — Progress Notes (Signed)
 Copay with [facility] is $13. Called to see if they have grant information on file, but they only show primary coverage. Attempted to contact patient's son, Prentice to see if they have more information-LVM

## 2024-07-24 NOTE — Progress Notes (Signed)
 Spoke with patient's son-patient has no copay assistance/PAP. Medication is $13 with [facility]. He will continue to fill with them. Dis-enrolling.

## 2024-07-24 NOTE — Progress Notes (Signed)
 Pharmacy Patient Advocate Encounter  Insurance verification completed.   The patient is insured through [employer] test claim for Xtandi . Co-pay is $6,831.31.  This test claim was processed through [facility]- copay amounts may vary at other pharmacies due to pharmacy/plan contracts, or as the patient moves through the different stages of their insurance plan.

## 2024-07-26 ENCOUNTER — Other Ambulatory Visit: Payer: Self-pay

## 2024-07-29 ENCOUNTER — Other Ambulatory Visit: Payer: Self-pay

## 2024-07-30 ENCOUNTER — Other Ambulatory Visit: Payer: Self-pay

## 2024-07-31 ENCOUNTER — Encounter: Payer: Self-pay | Admitting: Hematology

## 2024-07-31 ENCOUNTER — Other Ambulatory Visit: Payer: Self-pay

## 2024-08-01 ENCOUNTER — Encounter: Payer: Self-pay | Admitting: Hematology

## 2024-08-01 ENCOUNTER — Other Ambulatory Visit: Payer: Self-pay

## 2024-08-02 ENCOUNTER — Other Ambulatory Visit: Payer: Self-pay

## 2024-08-08 ENCOUNTER — Other Ambulatory Visit: Payer: Self-pay

## 2024-08-09 DIAGNOSIS — R338 Other retention of urine: Secondary | ICD-10-CM | POA: Diagnosis not present

## 2024-08-09 DIAGNOSIS — N3020 Other chronic cystitis without hematuria: Secondary | ICD-10-CM | POA: Diagnosis not present

## 2024-08-13 ENCOUNTER — Encounter: Payer: Self-pay | Admitting: Hematology

## 2024-08-14 ENCOUNTER — Emergency Department (HOSPITAL_COMMUNITY)

## 2024-08-14 ENCOUNTER — Encounter: Payer: Self-pay | Admitting: Hematology

## 2024-08-14 ENCOUNTER — Other Ambulatory Visit: Payer: Self-pay

## 2024-08-14 ENCOUNTER — Encounter (HOSPITAL_COMMUNITY): Payer: Self-pay | Admitting: Emergency Medicine

## 2024-08-14 ENCOUNTER — Inpatient Hospital Stay (HOSPITAL_COMMUNITY)
Admission: EM | Admit: 2024-08-14 | Discharge: 2024-08-17 | DRG: 194 | Disposition: A | Discharge status: Home / Self Care | Attending: Internal Medicine | Admitting: Internal Medicine

## 2024-08-14 DIAGNOSIS — Z1152 Encounter for screening for COVID-19: Secondary | ICD-10-CM

## 2024-08-14 DIAGNOSIS — E860 Dehydration: Secondary | ICD-10-CM | POA: Diagnosis present

## 2024-08-14 DIAGNOSIS — N1831 Chronic kidney disease, stage 3a: Secondary | ICD-10-CM | POA: Diagnosis present

## 2024-08-14 DIAGNOSIS — E785 Hyperlipidemia, unspecified: Secondary | ICD-10-CM | POA: Diagnosis present

## 2024-08-14 DIAGNOSIS — R441 Visual hallucinations: Secondary | ICD-10-CM | POA: Diagnosis present

## 2024-08-14 DIAGNOSIS — F0282 Dementia in other diseases classified elsewhere, unspecified severity, with psychotic disturbance: Secondary | ICD-10-CM | POA: Diagnosis present

## 2024-08-14 DIAGNOSIS — Z79899 Other long term (current) drug therapy: Secondary | ICD-10-CM

## 2024-08-14 DIAGNOSIS — N2889 Other specified disorders of kidney and ureter: Secondary | ICD-10-CM | POA: Diagnosis present

## 2024-08-14 DIAGNOSIS — Z66 Do not resuscitate: Secondary | ICD-10-CM | POA: Diagnosis present

## 2024-08-14 DIAGNOSIS — N390 Urinary tract infection, site not specified: Principal | ICD-10-CM

## 2024-08-14 DIAGNOSIS — I5042 Chronic combined systolic (congestive) and diastolic (congestive) heart failure: Secondary | ICD-10-CM | POA: Diagnosis present

## 2024-08-14 DIAGNOSIS — Z8546 Personal history of malignant neoplasm of prostate: Secondary | ICD-10-CM

## 2024-08-14 DIAGNOSIS — H3530 Unspecified macular degeneration: Secondary | ICD-10-CM | POA: Diagnosis present

## 2024-08-14 DIAGNOSIS — J440 Chronic obstructive pulmonary disease with acute lower respiratory infection: Secondary | ICD-10-CM | POA: Diagnosis present

## 2024-08-14 DIAGNOSIS — K224 Dyskinesia of esophagus: Secondary | ICD-10-CM | POA: Diagnosis present

## 2024-08-14 DIAGNOSIS — C61 Malignant neoplasm of prostate: Secondary | ICD-10-CM | POA: Diagnosis present

## 2024-08-14 DIAGNOSIS — J189 Pneumonia, unspecified organism: Principal | ICD-10-CM | POA: Diagnosis present

## 2024-08-14 DIAGNOSIS — C7951 Secondary malignant neoplasm of bone: Secondary | ICD-10-CM | POA: Diagnosis present

## 2024-08-14 DIAGNOSIS — G301 Alzheimer's disease with late onset: Secondary | ICD-10-CM | POA: Diagnosis present

## 2024-08-14 DIAGNOSIS — F0280 Dementia in other diseases classified elsewhere without behavioral disturbance: Secondary | ICD-10-CM | POA: Diagnosis present

## 2024-08-14 DIAGNOSIS — L89216 Pressure-induced deep tissue damage of right hip: Secondary | ICD-10-CM | POA: Diagnosis present

## 2024-08-14 DIAGNOSIS — Z8249 Family history of ischemic heart disease and other diseases of the circulatory system: Secondary | ICD-10-CM

## 2024-08-14 DIAGNOSIS — Z794 Long term (current) use of insulin: Secondary | ICD-10-CM

## 2024-08-14 DIAGNOSIS — E871 Hypo-osmolality and hyponatremia: Secondary | ICD-10-CM | POA: Diagnosis present

## 2024-08-14 DIAGNOSIS — E875 Hyperkalemia: Secondary | ICD-10-CM | POA: Diagnosis present

## 2024-08-14 DIAGNOSIS — Z96659 Presence of unspecified artificial knee joint: Secondary | ICD-10-CM | POA: Diagnosis present

## 2024-08-14 DIAGNOSIS — M1990 Unspecified osteoarthritis, unspecified site: Secondary | ICD-10-CM | POA: Diagnosis present

## 2024-08-14 DIAGNOSIS — E1122 Type 2 diabetes mellitus with diabetic chronic kidney disease: Secondary | ICD-10-CM | POA: Diagnosis present

## 2024-08-14 DIAGNOSIS — I872 Venous insufficiency (chronic) (peripheral): Secondary | ICD-10-CM | POA: Diagnosis present

## 2024-08-14 DIAGNOSIS — I130 Hypertensive heart and chronic kidney disease with heart failure and stage 1 through stage 4 chronic kidney disease, or unspecified chronic kidney disease: Secondary | ICD-10-CM | POA: Diagnosis present

## 2024-08-14 DIAGNOSIS — J4489 Other specified chronic obstructive pulmonary disease: Secondary | ICD-10-CM | POA: Diagnosis present

## 2024-08-14 DIAGNOSIS — E559 Vitamin D deficiency, unspecified: Secondary | ICD-10-CM | POA: Diagnosis present

## 2024-08-14 DIAGNOSIS — E119 Type 2 diabetes mellitus without complications: Secondary | ICD-10-CM

## 2024-08-14 DIAGNOSIS — Z85820 Personal history of malignant melanoma of skin: Secondary | ICD-10-CM

## 2024-08-14 DIAGNOSIS — Z936 Other artificial openings of urinary tract status: Secondary | ICD-10-CM

## 2024-08-14 DIAGNOSIS — Z833 Family history of diabetes mellitus: Secondary | ICD-10-CM

## 2024-08-14 DIAGNOSIS — N400 Enlarged prostate without lower urinary tract symptoms: Secondary | ICD-10-CM | POA: Diagnosis present

## 2024-08-14 DIAGNOSIS — I5032 Chronic diastolic (congestive) heart failure: Secondary | ICD-10-CM | POA: Diagnosis present

## 2024-08-14 DIAGNOSIS — Z85828 Personal history of other malignant neoplasm of skin: Secondary | ICD-10-CM

## 2024-08-14 LAB — CBC
HCT: 38 % — ABNORMAL LOW (ref 39.0–52.0)
Hemoglobin: 12.1 g/dL — ABNORMAL LOW (ref 13.0–17.0)
MCH: 29.4 pg (ref 26.0–34.0)
MCHC: 31.8 g/dL (ref 30.0–36.0)
MCV: 92.2 fL (ref 80.0–100.0)
Platelets: 219 K/uL (ref 150–400)
RBC: 4.12 MIL/uL — ABNORMAL LOW (ref 4.22–5.81)
RDW: 15 % (ref 11.5–15.5)
WBC: 16.2 K/uL — ABNORMAL HIGH (ref 4.0–10.5)
nRBC: 0 % (ref 0.0–0.2)

## 2024-08-14 LAB — COMPREHENSIVE METABOLIC PANEL WITH GFR
ALT: 16 U/L (ref 0–44)
AST: 26 U/L (ref 15–41)
Albumin: 3.2 g/dL — ABNORMAL LOW (ref 3.5–5.0)
Alkaline Phosphatase: 214 U/L — ABNORMAL HIGH (ref 38–126)
Anion gap: 8 (ref 5–15)
BUN: 24 mg/dL — ABNORMAL HIGH (ref 8–23)
CO2: 25 mmol/L (ref 22–32)
Calcium: 9 mg/dL (ref 8.9–10.3)
Chloride: 97 mmol/L — ABNORMAL LOW (ref 98–111)
Creatinine, Ser: 1.53 mg/dL — ABNORMAL HIGH (ref 0.61–1.24)
GFR, Estimated: 42 mL/min — ABNORMAL LOW (ref >60)
Glucose, Bld: 425 mg/dL — ABNORMAL HIGH (ref 70–99)
Potassium: 6 mmol/L — ABNORMAL HIGH (ref 3.5–5.1)
Sodium: 131 mmol/L — ABNORMAL LOW (ref 135–145)
Total Bilirubin: 0.3 mg/dL (ref 0.0–1.2)
Total Protein: 6.3 g/dL — ABNORMAL LOW (ref 6.5–8.1)

## 2024-08-14 LAB — URINALYSIS, ROUTINE W REFLEX MICROSCOPIC
Bilirubin Urine: NEGATIVE
Glucose, UA: >=500 mg/dL — AB
Hgb urine dipstick: NEGATIVE
Ketones, ur: NEGATIVE mg/dL
Nitrite: NEGATIVE
Protein, ur: 100 mg/dL — AB
Specific Gravity, Urine: 1.015 (ref 1.005–1.030)
WBC, UA: >50 WBC/hpf (ref 0–5)
pH: 5 (ref 5.0–8.0)

## 2024-08-14 LAB — PRO BRAIN NATRIURETIC PEPTIDE: Pro Brain Natriuretic Peptide: 344 pg/mL — ABNORMAL HIGH (ref <300.0)

## 2024-08-14 LAB — TROPONIN T, HIGH SENSITIVITY: Troponin T High Sensitivity: 69 ng/L — ABNORMAL HIGH (ref 0–19)

## 2024-08-14 LAB — CBG MONITORING, ED: Glucose-Capillary: 355 mg/dL — ABNORMAL HIGH (ref 70–99)

## 2024-08-14 NOTE — ED Provider Notes (Signed)
 [place] EMERGENC[facility] DEPARTMENT AT [facility] Provider Note   CSN: [id number] Arrival date & time: 08/14/24  2142     Patient presents with: Altered Mental Status, Fatigue, and Hyperglycemia   Nicholas Burton is a 89 y.o. male with history of combined systolic and diastolic CHF, PSA elevation, chronic renal insufficiency stage III, dyspnea on exertion, COPD, edema, memory loss, type 2 diabetes, cataracts.  Presents to ED for evaluation of generalized weakness.  Presents with son who provides the majority of the history.  Son reports that patient lives at facility, independent living.  Patient son reports that patient will have caregivers come in throughout the day to help with his activities of daily living.  Patient son reports that today he was with his home health aides when they state that the patient began to complain of generalized weakness, inability to walk.  Patient was able to ambulate as usual.  The patient has had no fevers, nausea, vomiting at home per patient's son.  Has had normal bowel movements.  Patient son reports patient does have issues with memory and he appears to be at his baseline mental status.  He reports that he has a chronic indwelling Foley that he has had for the last 2 years, last changed last Friday, which he has for enlarged prostate.  Patient son reports patient has had increased sediment to his Foley catheter collection in the last few months.  On my exam, patient alert and oriented to baseline. Complaining of generalized weakness.   Altered Mental Status Hyperglycemia Associated symptoms: altered mental status        Prior to Admission medications  Medication Sig Start Date End Date Taking? Authorizing Provider  acetaminophen  (TYLENOL ) 325 MG tablet Take 1-2 tablets (325-650 mg total) by mouth every 6 (six) hours as needed for mild pain. 10/21/22   Angiulli, Toribio PARAS, PA-C  BD AUTOSHIELD DUO 30G X 5 MM MISC USE AS DIRECTED TO ADMINISTER INSULIN   04/02/24   Joshua Debby CROME, MD  cholecalciferol  (VITAMIN D ) 1000 units tablet Take 1 tablet (1,000 Units total) by mouth daily. 10/21/22   Angiulli, Toribio PARAS, PA-C  cholecalciferol  (VITAMIN D3) 25 MCG (1000 UNIT) tablet Take 1 tablet by mouth once a day 07/04/24     Colesevelam  HCl 3.75 g PACK Take 1 packet by mouth daily. 09/21/23   Joshua Debby CROME, MD  Colesevelam  HCl 3.75 g PACK Take 1 packet (3.75 g total) by mouth daily. 07/04/24     cyanocobalamin  (VITAMIN B12) 500 MCG tablet Take 1 tablet (500 mcg total) by mouth daily. 10/21/22   Angiulli, Toribio PARAS, PA-C  enzalutamide  (XTANDI ) 80 MG tablet Take 1 tablet (80 mg total) by mouth daily. 11/15/23   Onesimo Emaline Brink, MD  enzalutamide  (XTANDI ) 80 MG tablet Take 1 tablet by mouth once a day 07/04/24     insulin  glargine (LANTUS  SOLOSTAR) 100 UNIT/ML Solostar Pen Inject 15 Units into the skin at bedtime. 07/04/24     insulin  glargine (LANTUS ) 100 UNIT/ML injection Inject 5 unit subcutaneously once a day in am 07/04/24     LANTUS  SOLOSTAR 100 UNIT/ML Solostar Pen ADMINISTER 23 UNITS UNDER THE SKIN AT BEDTIME Patient taking differently: Inject 23 Units into the skin at bedtime. 01/08/24   Joshua Debby CROME, MD  magnesium  gluconate (MAGONATE) 500 (27 Mg) MG TABS tablet Take 1 tablet (500 mg total) by mouth at bedtime. 07/04/24     magnesium  gluconate (MAGONATE) 500 MG tablet Take 1 tablet (500 mg  total) by mouth at bedtime. 10/21/22   Angiulli, Daniel J, PA-C  melatonin 3 MG TABS tablet Take 1 tablet (3 mg total) by mouth at bedtime as needed. 10/21/22   Angiulli, Daniel J, PA-C  melatonin 3 MG TABS tablet Take 1 tablet by mouth every night at bedtime as needed for insomnia 07/04/24     metoprolol  tartrate (LOPRESSOR ) 25 MG tablet TAKE 1/2 TABLET BY MOUTH TWICE DAILY 02/15/24   Varanasi, Jayadeep S, MD  metoprolol  tartrate (LOPRESSOR ) 25 MG tablet Take 1/2 tablet by mouth twice a day 07/04/24     Multiple Vitamins-Minerals (MULTIVITAL PO) Take 1 tablet by mouth 2  (two) times daily.    [provider]  Multiple Vitamins-Minerals (MULTIVITAMIN ADULTS 50+) TABS Take 1 tablet by mouth daily. 07/04/24     Multiple Vitamins-Minerals (PRESERVISION AREDS 2) CAPS Take 1 capsule by mouth 2 (two) times daily. 07/04/24     Potassium Chloride  ER 20 MEQ TBCR Take 1 tablet by mouth 3 (three) times daily. 08/14/23   [provider]  Potassium Chloride  ER 20 MEQ TBCR Take 1 tablet by mouth three times a day 07/04/24     tamsulosin  (FLOMAX ) 0.4 MG CAPS capsule TAKE 1 CAPSULE(0.4 MG) BY MOUTH DAILY 03/29/24   Joshua Debby CROME, MD  tamsulosin  (FLOMAX ) 0.4 MG CAPS capsule Take 1 capsule by mouth every night at bedtime 07/04/24     torsemide  (DEMADEX ) 10 MG tablet Take 1 tablet (10 mg total) by mouth every other day. 09/21/23   Joshua Debby CROME, MD  torsemide  (DEMADEX ) 10 MG tablet Take 1 tablet by mouth every other day 07/04/24     Vibegron  (GEMTESA ) 75 MG TABS Take 1 tablet (75 mg total) by mouth daily. 12/06/23   Joshua Debby CROME, MD  vitamin B-12 (CYANOCOBALAMIN ) 500 MCG tablet Take 1 tablet (500 mcg total) by mouth daily. 07/04/24       Allergies: Ace inhibitors, Oxycodone, and Penicillins    Review of Systems  All other systems reviewed and are negative.   Updated Vital Signs BP 108/62   Pulse 100   Temp 98.5 F (36.9 C) (Oral)   Resp (!) 24   SpO2 99%   Physical Exam Vitals and nursing note reviewed.  Constitutional:      General: He is not in acute distress.    Appearance: He is well-developed.  HENT:     Head: Normocephalic and atraumatic.  Eyes:     Conjunctiva/sclera: Conjunctivae normal.  Cardiovascular:     Rate and Rhythm: Normal rate and regular rhythm.     Heart sounds: No murmur heard. Pulmonary:     Effort: Pulmonary effort is normal. No respiratory distress.     Breath sounds: Normal breath sounds.  Abdominal:     Palpations: Abdomen is soft.     Tenderness: There is no abdominal tenderness.  Genitourinary:    Comments:  Indwelling Foley catheter in place Musculoskeletal:        General: No swelling.     Cervical back: Neck supple.  Skin:    General: Skin is warm and dry.     Capillary Refill: Capillary refill takes less than 2 seconds.  Neurological:     Mental Status: He is alert. Mental status is at baseline.  Psychiatric:        Mood and Affect: Mood normal.     (all labs ordered are listed, but only abnormal results are displayed) Labs Reviewed  COMPREHENSIVE METABOLIC PANEL WITH GFR - Abnormal;  Notable for the following components:      Result Value   Sodium 131 (*)    Potassium 6.0 (*)    Chloride 97 (*)    Glucose, Bld 425 (*)    BUN 24 (*)    Creatinine, Ser 1.53 (*)    Total Protein 6.3 (*)    Albumin 3.2 (*)    Alkaline Phosphatase 214 (*)    GFR, Estimated 42 (*)    All other components within normal limits  CBC - Abnormal; Notable for the following components:   WBC 16.2 (*)    RBC 4.12 (*)    Hemoglobin 12.1 (*)    HCT 38.0 (*)    All other components within normal limits  URINALYSIS, ROUTINE W REFLEX MICROSCOPIC - Abnormal; Notable for the following components:   APPearance TURBID (*)    Glucose, UA >=500 (*)    Protein, ur 100 (*)    Leukocytes,Ua LARGE (*)    Bacteria, UA MANY (*)    All other components within normal limits  PRO BRAIN NATRIURETIC PEPTIDE - Abnormal; Notable for the following components:   Pro Brain Natriuretic Peptide 344.0 (*)    All other components within normal limits  CBG MONITORING, ED - Abnormal; Notable for the following components:   Glucose-Capillary 355 (*)    All other components within normal limits  CBG MONITORING, ED - Abnormal; Notable for the following components:   Glucose-Capillary 335 (*)    All other components within normal limits  CBG MONITORING, ED - Abnormal; Notable for the following components:   Glucose-Capillary 177 (*)    All other components within normal limits  TROPONIN T, HIGH SENSITIVITY - Abnormal; Notable for  the following components:   Troponin T High Sensitivity 69 (*)    All other components within normal limits  TROPONIN T, HIGH SENSITIVITY - Abnormal; Notable for the following components:   Troponin T High Sensitivity 70 (*)    All other components within normal limits  RESP PANEL BY RT-PCR (RSV, FLU A&B, COVID)  RVPGX2  URINE CULTURE  BETA-HYDROXYBUTYRIC ACID  MAGNESIUM   POTASSIUM  POTASSIUM  POTASSIUM  POTASSIUM  POTASSIUM    EKG: EKG Interpretation Date/Time:  Wednesday August 14 2024 22:34:17 EST Ventricular Rate:  93 PR Interval:  161 QRS Duration:  137 QT Interval:  373 QTC Calculation: 464 R Axis:   -63  Text Interpretation: Sinus rhythm RBBB and LAFB No significant change since last tracing Confirmed by Patt Alm DEL [phone]) on 08/14/2024 10:36:45 PM  Radiology: DG Chest 2 View Result Date: 08/14/2024 EXAM: 2 VIEW(S) XRAY OF THE CHEST 08/14/2024 11:11:00 PM COMPARISON: 01/15/2024 CLINICAL HISTORY: assess for pneumonia FINDINGS: LUNGS AND PLEURA: Low lung volumes. Patchy airspace opacity in left upper lung. Chronic blunting of left costophrenic sulcus. No pneumothorax. HEART AND MEDIASTINUM: No acute abnormality of the cardiac and mediastinal silhouettes. BONES AND SOFT TISSUES: No acute osseous abnormality. IMPRESSION: 1. Patchy airspace opacity in the left upper lung, suspicious for pneumonia. Electronically signed by: [name] Devonshire MD 08/14/2024 11:29 PM EST RP Workstation: [id number]    Procedures   Medications Ordered in the ED  azithromycin  (ZITHROMAX ) 500 mg in sodium chloride  0.9 % 250 mL IVPB (500 mg Intravenous New Bag/Given 08/15/24 0436)  metoprolol  tartrate (LOPRESSOR ) tablet 12.5 mg (has no administration in time range)  cefTRIAXone  (ROCEPHIN ) 2 g in sodium chloride  0.9 % 100 mL IVPB (has no administration in time range)  azithromycin  (ZITHROMAX ) tablet 500 mg (has no administration  in time range)  guaiFENesin  (ROBITUSSIN) 100 MG/5ML liquid 5 mL (has no  administration in time range)  insulin  aspart (novoLOG ) injection 0-6 Units (has no administration in time range)  insulin  aspart (novoLOG ) injection 0-5 Units (has no administration in time range)  insulin  glargine-yfgn (SEMGLEE ) injection 10 Units (has no administration in time range)  enoxaparin  (LOVENOX ) injection 40 mg (has no administration in time range)  sodium chloride  flush (NS) 0.9 % injection 3 mL (has no administration in time range)  acetaminophen  (TYLENOL ) tablet 650 mg (has no administration in time range)    Or  acetaminophen  (TYLENOL ) suppository 650 mg (has no administration in time range)  senna (SENOKOT) tablet 8.6 mg (has no administration in time range)  prochlorperazine  (COMPAZINE ) injection 5 mg (has no administration in time range)  ipratropium-albuterol  (DUONEB) 0.5-2.5 (3) MG/3ML nebulizer solution 3 mL (has no administration in time range)  cefTRIAXone  (ROCEPHIN ) 1 g in sodium chloride  0.9 % 100 mL IVPB (0 g Intravenous Stopped 08/15/24 0155)  sodium chloride  0.9 % bolus 500 mL (0 mLs Intravenous Stopped 08/15/24 0345)  insulin  aspart (novoLOG ) injection 10 Units (10 Units Intravenous Given 08/15/24 0145)  sodium zirconium cyclosilicate  (LOKELMA ) packet 10 g (10 g Oral Given 08/15/24 0145)    Clinical Course as of 08/15/24 0527  Thu Aug 15, 2024  0055 CAP, CURB-65 score 3, slightly confused beyond baseline per son [CG]  0056 UTI [CG]  0056 HyperK [CG]  0056 HypoNa [CG]  0216 Port score 144 [CG]    Clinical Course User Index [CG] Ruthell Lonni FALCON, PA-C   Medical Decision Making Amount and/or Complexity of Data Reviewed Labs: ordered. Radiology: ordered.  Risk OTC drugs. Prescription drug management. Decision regarding hospitalization.   89 year old male presents with his son for evaluation.  On exam, HD stable.  Lung sounds clear bilaterally, no hypoxia.  Abdomen soft and compressible.  Neuroexam at baseline.  Patient is vague with his  history.  Not specifying any specific complaint besides generalized weakness.  Does have some sediment in Foley catheter bag.  Associated [facility] CBC, CMP, urinalysis, viral panel, beta-hydroxybutyrate catheter, BNP, troponin x 2, magnesium , chest x-ray, EKG.  CBC with leukocytosis 16.2, baseline hemoglobin.  Metabolic panel with sodium 131, potassium 6.0, glucose 425, BUN 24, creatinine 1.53, alk phos 214, GFR 42 and anion gap 18.  Patient hyperkalemic, hyperglycemic.  Given 10 units of insulin , Lokelma  for this.  Also given 500 L of fluid.  Urinalysis shows large leukocytes, many bacteria.  Urine culture at this time.  BNP elevated 344 however no appearance of volume overload on exam.  Chest x-ray shows no effusions however does show evidence of pneumonia.  Patient started on Rocephin , azithromycin  at this time.  Will also plan to utilize Rocephin  for suspected UTI.  Opponent's are flat at 69 and 70.  Magnesium  2.1.  Viral panel negative for all.  Beta-hydroxybutyrate acid not elevated at 0.12.  Urine culture at this time.  Patient requires admission to hospital for community-acquired pneumonia, port score 144, hyponatremia, hyperkalemia, UTI.  Discussed with Dr. Charlton, hospitalist service, who agrees to admit patient.  Stable on admission.    Final diagnoses:  Lower urinary tract infectious disease  Hyperkalemia  Hyponatremia  Community acquired pneumonia, unspecified laterality    ED Discharge Orders     None          Ruthell Lonni FALCON DEVONNA 08/15/24 9472    Bari Charmaine FALCON, MD 08/15/24 [phone]

## 2024-08-14 NOTE — ED Triage Notes (Signed)
 Patient presents from [facility] via EMS due to flu like symptoms and hyperglycemia. He experiences coughing, general weakness and malaise. His family reports he is not as sharp as he usually is. EMS reports the patient is A&O x 2 which is his baseline. RN attempted to ask the patient questions, but did not get any answers.   EMS vitals: 138/78 BP 86 HR 18 RR 98% SPO2 on room air 479 CBG

## 2024-08-15 ENCOUNTER — Encounter (HOSPITAL_COMMUNITY): Payer: Self-pay | Admitting: Family Medicine

## 2024-08-15 DIAGNOSIS — N2889 Other specified disorders of kidney and ureter: Secondary | ICD-10-CM

## 2024-08-15 DIAGNOSIS — Z794 Long term (current) use of insulin: Secondary | ICD-10-CM | POA: Diagnosis not present

## 2024-08-15 DIAGNOSIS — E119 Type 2 diabetes mellitus without complications: Secondary | ICD-10-CM | POA: Diagnosis not present

## 2024-08-15 DIAGNOSIS — J189 Pneumonia, unspecified organism: Secondary | ICD-10-CM | POA: Diagnosis present

## 2024-08-15 DIAGNOSIS — Z8546 Personal history of malignant neoplasm of prostate: Secondary | ICD-10-CM | POA: Diagnosis not present

## 2024-08-15 DIAGNOSIS — L89216 Pressure-induced deep tissue damage of right hip: Secondary | ICD-10-CM | POA: Diagnosis present

## 2024-08-15 DIAGNOSIS — I5032 Chronic diastolic (congestive) heart failure: Secondary | ICD-10-CM

## 2024-08-15 DIAGNOSIS — R531 Weakness: Secondary | ICD-10-CM | POA: Diagnosis present

## 2024-08-15 DIAGNOSIS — I5042 Chronic combined systolic (congestive) and diastolic (congestive) heart failure: Secondary | ICD-10-CM | POA: Diagnosis present

## 2024-08-15 DIAGNOSIS — E875 Hyperkalemia: Secondary | ICD-10-CM | POA: Diagnosis present

## 2024-08-15 DIAGNOSIS — G301 Alzheimer's disease with late onset: Secondary | ICD-10-CM

## 2024-08-15 DIAGNOSIS — Z85828 Personal history of other malignant neoplasm of skin: Secondary | ICD-10-CM | POA: Diagnosis not present

## 2024-08-15 DIAGNOSIS — E1122 Type 2 diabetes mellitus with diabetic chronic kidney disease: Secondary | ICD-10-CM | POA: Diagnosis present

## 2024-08-15 DIAGNOSIS — Z8249 Family history of ischemic heart disease and other diseases of the circulatory system: Secondary | ICD-10-CM | POA: Diagnosis not present

## 2024-08-15 DIAGNOSIS — E860 Dehydration: Secondary | ICD-10-CM | POA: Diagnosis present

## 2024-08-15 DIAGNOSIS — Z1152 Encounter for screening for COVID-19: Secondary | ICD-10-CM | POA: Diagnosis not present

## 2024-08-15 DIAGNOSIS — F0280 Dementia in other diseases classified elsewhere without behavioral disturbance: Secondary | ICD-10-CM

## 2024-08-15 DIAGNOSIS — N1831 Chronic kidney disease, stage 3a: Secondary | ICD-10-CM | POA: Diagnosis present

## 2024-08-15 DIAGNOSIS — E559 Vitamin D deficiency, unspecified: Secondary | ICD-10-CM | POA: Diagnosis present

## 2024-08-15 DIAGNOSIS — C7951 Secondary malignant neoplasm of bone: Secondary | ICD-10-CM | POA: Diagnosis present

## 2024-08-15 DIAGNOSIS — N400 Enlarged prostate without lower urinary tract symptoms: Secondary | ICD-10-CM | POA: Diagnosis present

## 2024-08-15 DIAGNOSIS — J4489 Other specified chronic obstructive pulmonary disease: Secondary | ICD-10-CM | POA: Diagnosis not present

## 2024-08-15 DIAGNOSIS — E785 Hyperlipidemia, unspecified: Secondary | ICD-10-CM | POA: Diagnosis present

## 2024-08-15 DIAGNOSIS — Z85820 Personal history of malignant melanoma of skin: Secondary | ICD-10-CM | POA: Diagnosis not present

## 2024-08-15 DIAGNOSIS — Z936 Other artificial openings of urinary tract status: Secondary | ICD-10-CM | POA: Diagnosis not present

## 2024-08-15 DIAGNOSIS — C61 Malignant neoplasm of prostate: Secondary | ICD-10-CM

## 2024-08-15 DIAGNOSIS — Z66 Do not resuscitate: Secondary | ICD-10-CM | POA: Diagnosis present

## 2024-08-15 DIAGNOSIS — I130 Hypertensive heart and chronic kidney disease with heart failure and stage 1 through stage 4 chronic kidney disease, or unspecified chronic kidney disease: Secondary | ICD-10-CM | POA: Diagnosis present

## 2024-08-15 DIAGNOSIS — J440 Chronic obstructive pulmonary disease with acute lower respiratory infection: Secondary | ICD-10-CM | POA: Diagnosis present

## 2024-08-15 DIAGNOSIS — E871 Hypo-osmolality and hyponatremia: Secondary | ICD-10-CM | POA: Diagnosis present

## 2024-08-15 DIAGNOSIS — F0282 Dementia in other diseases classified elsewhere, unspecified severity, with psychotic disturbance: Secondary | ICD-10-CM | POA: Diagnosis present

## 2024-08-15 LAB — RESP PANEL BY RT-PCR (RSV, FLU A&B, COVID)  RVPGX2
Influenza A by PCR: NEGATIVE
Influenza B by PCR: NEGATIVE
Resp Syncytial Virus by PCR: NEGATIVE
SARS Coronavirus 2 by RT PCR: NEGATIVE

## 2024-08-15 LAB — GLUCOSE, CAPILLARY
Glucose-Capillary: 155 mg/dL — ABNORMAL HIGH (ref 70–99)
Glucose-Capillary: 116 mg/dL — ABNORMAL HIGH (ref 70–99)
Glucose-Capillary: 238 mg/dL — ABNORMAL HIGH (ref 70–99)
Glucose-Capillary: 205 mg/dL — ABNORMAL HIGH (ref 70–99)

## 2024-08-15 LAB — BASIC METABOLIC PANEL WITH GFR
Anion gap: 7 (ref 5–15)
BUN: 21 mg/dL (ref 8–23)
CO2: 25 mmol/L (ref 22–32)
Calcium: 9 mg/dL (ref 8.9–10.3)
Chloride: 105 mmol/L (ref 98–111)
Creatinine, Ser: 1.29 mg/dL — ABNORMAL HIGH (ref 0.61–1.24)
GFR, Estimated: 51 mL/min — ABNORMAL LOW (ref >60)
Glucose, Bld: 134 mg/dL — ABNORMAL HIGH (ref 70–99)
Potassium: 5 mmol/L (ref 3.5–5.1)
Sodium: 137 mmol/L (ref 135–145)

## 2024-08-15 LAB — POTASSIUM
Potassium: 5 mmol/L (ref 3.5–5.1)
Potassium: 5 mmol/L (ref 3.5–5.1)
Potassium: 5 mmol/L (ref 3.5–5.1)
Potassium: 5.1 mmol/L (ref 3.5–5.1)
Potassium: 5 mmol/L (ref 3.5–5.1)

## 2024-08-15 LAB — TROPONIN T, HIGH SENSITIVITY: Troponin T High Sensitivity: 70 ng/L — ABNORMAL HIGH (ref 0–19)

## 2024-08-15 LAB — BETA-HYDROXYBUTYRIC ACID: Beta-Hydroxybutyric Acid: 0.12 mmol/L (ref 0.05–0.27)

## 2024-08-15 LAB — CBG MONITORING, ED
Glucose-Capillary: 177 mg/dL — ABNORMAL HIGH (ref 70–99)
Glucose-Capillary: 335 mg/dL — ABNORMAL HIGH (ref 70–99)

## 2024-08-15 LAB — MAGNESIUM: Magnesium: 2.1 mg/dL (ref 1.7–2.4)

## 2024-08-15 MED ORDER — INSULIN ASPART 100 UNIT/ML IJ SOLN: 8.0000 [IU] | Freq: Once | INTRAMUSCULAR | Status: DC

## 2024-08-15 MED ORDER — SODIUM CHLORIDE 0.9 % IV SOLN
1.0000 g | Freq: Once | INTRAVENOUS | Status: AC
  Administered 2024-08-15: 01:00:00 1 g via INTRAVENOUS
  Filled 2024-08-15: qty 10

## 2024-08-15 MED ORDER — ENZALUTAMIDE 80 MG PO TABS: 80.0000 mg | ORAL_TABLET | Freq: Every day | ORAL | Status: DC

## 2024-08-15 MED ORDER — SODIUM CHLORIDE 0.9% FLUSH
3.0000 mL | Freq: Two times a day (BID) | INTRAVENOUS | Status: DC
  Administered 2024-08-15 – 2024-08-17 (×5): 3 mL via INTRAVENOUS

## 2024-08-15 MED ORDER — IPRATROPIUM-ALBUTEROL 0.5-2.5 (3) MG/3ML IN SOLN: 3.0000 mL | Freq: Four times a day (QID) | RESPIRATORY_TRACT | Status: DC | PRN

## 2024-08-15 MED ORDER — ACETAMINOPHEN 325 MG PO TABS: 650.0000 mg | ORAL_TABLET | Freq: Four times a day (QID) | ORAL | Status: DC | PRN

## 2024-08-15 MED ORDER — INSULIN ASPART 100 UNIT/ML IJ SOLN
0.0000 [IU] | Freq: Three times a day (TID) | INTRAMUSCULAR | Status: DC
  Administered 2024-08-15: 17:00:00 2 [IU] via SUBCUTANEOUS
  Administered 2024-08-15: 09:00:00 1 [IU] via SUBCUTANEOUS
  Administered 2024-08-16: 3 [IU] via SUBCUTANEOUS
  Administered 2024-08-16: 1 [IU] via SUBCUTANEOUS
  Administered 2024-08-16: 2 [IU] via SUBCUTANEOUS
  Administered 2024-08-17: 1 [IU] via SUBCUTANEOUS
  Filled 2024-08-15: qty 2
  Filled 2024-08-15: qty 3
  Filled 2024-08-15 (×3): qty 1
  Filled 2024-08-15: qty 2

## 2024-08-15 MED ORDER — MAGNESIUM GLUCONATE 500 (27 MG) MG PO TABS
500.0000 mg | ORAL_TABLET | Freq: Every day | ORAL | Status: DC
  Administered 2024-08-15 – 2024-08-16 (×2): 500 mg via ORAL
  Filled 2024-08-15 (×2): qty 1

## 2024-08-15 MED ORDER — ENOXAPARIN SODIUM 30 MG/0.3ML IJ SOSY
30.0000 mg | PREFILLED_SYRINGE | INTRAMUSCULAR | Status: DC
  Administered 2024-08-15 – 2024-08-17 (×3): 30 mg via SUBCUTANEOUS
  Filled 2024-08-15 (×3): qty 0.3

## 2024-08-15 MED ORDER — SODIUM CHLORIDE 0.9 % IV SOLN
500.0000 mg | Freq: Once | INTRAVENOUS | Status: AC
  Administered 2024-08-15: 05:00:00 500 mg via INTRAVENOUS
  Filled 2024-08-15: qty 5

## 2024-08-15 MED ORDER — SODIUM CHLORIDE 0.9 % IV BOLUS
500.0000 mL | Freq: Once | INTRAVENOUS | Status: AC
  Administered 2024-08-15: 01:00:00 500 mL via INTRAVENOUS

## 2024-08-15 MED ORDER — SODIUM CHLORIDE 0.9 % IV SOLN
2.0000 g | INTRAVENOUS | Status: DC
  Administered 2024-08-15 – 2024-08-16 (×2): 2 g via INTRAVENOUS
  Filled 2024-08-15 (×2): qty 20

## 2024-08-15 MED ORDER — INSULIN ASPART 100 UNIT/ML IJ SOLN: 0.0000 [IU] | Freq: Every day | INTRAMUSCULAR | Status: DC

## 2024-08-15 MED ORDER — INSULIN GLARGINE-YFGN 100 UNIT/ML ~~LOC~~ SOLN
10.0000 [IU] | Freq: Every day | SUBCUTANEOUS | Status: DC
  Administered 2024-08-15 – 2024-08-17 (×3): 10 [IU] via SUBCUTANEOUS
  Filled 2024-08-15 (×3): qty 0.1

## 2024-08-15 MED ORDER — METOPROLOL TARTRATE 12.5 MG HALF TABLET
12.5000 mg | ORAL_TABLET | Freq: Two times a day (BID) | ORAL | Status: DC
  Administered 2024-08-15 – 2024-08-17 (×5): 12.5 mg via ORAL
  Filled 2024-08-15 (×5): qty 1

## 2024-08-15 MED ORDER — ACETAMINOPHEN 650 MG RE SUPP: 650.0000 mg | Freq: Four times a day (QID) | RECTAL | Status: DC | PRN

## 2024-08-15 MED ORDER — PROCHLORPERAZINE EDISYLATE 10 MG/2ML IJ SOLN: 5.0000 mg | Freq: Four times a day (QID) | INTRAMUSCULAR | Status: DC | PRN

## 2024-08-15 MED ORDER — TAMSULOSIN HCL 0.4 MG PO CAPS: 0.4000 mg | ORAL_CAPSULE | Freq: Two times a day (BID) | ORAL | Status: DC

## 2024-08-15 MED ORDER — AZITHROMYCIN 250 MG PO TABS
500.0000 mg | ORAL_TABLET | Freq: Every day | ORAL | Status: DC
  Administered 2024-08-16 – 2024-08-17 (×2): 500 mg via ORAL
  Filled 2024-08-15 (×2): qty 2

## 2024-08-15 MED ORDER — ENZALUTAMIDE 80 MG PO TABS
80.0000 mg | ORAL_TABLET | Freq: Every day | ORAL | Status: DC
  Administered 2024-08-15 – 2024-08-16 (×2): 80 mg via ORAL
  Filled 2024-08-15 (×3): qty 1

## 2024-08-15 MED ORDER — GUAIFENESIN 100 MG/5ML PO LIQD: 5.0000 mL | ORAL | Status: DC | PRN

## 2024-08-15 MED ORDER — SENNA 8.6 MG PO TABS: 1.0000 | ORAL_TABLET | Freq: Every day | ORAL | Status: DC | PRN

## 2024-08-15 MED ORDER — TAMSULOSIN HCL 0.4 MG PO CAPS
0.4000 mg | ORAL_CAPSULE | Freq: Every day | ORAL | Status: DC
  Administered 2024-08-15 – 2024-08-16 (×2): 0.4 mg via ORAL
  Filled 2024-08-15 (×2): qty 1

## 2024-08-15 MED ORDER — SODIUM ZIRCONIUM CYCLOSILICATE 10 G PO PACK
10.0000 g | PACK | Freq: Once | ORAL | Status: AC
  Administered 2024-08-15: 02:00:00 10 g via ORAL
  Filled 2024-08-15: qty 1

## 2024-08-15 MED ORDER — INSULIN ASPART 100 UNIT/ML IV SOLN
10.0000 [IU] | Freq: Once | INTRAVENOUS | Status: AC
  Administered 2024-08-15: 02:00:00 10 [IU] via INTRAVENOUS
  Filled 2024-08-15: qty 10

## 2024-08-15 MED ORDER — CHLORHEXIDINE GLUCONATE CLOTH 2 % EX PADS
6.0000 | MEDICATED_PAD | Freq: Every day | CUTANEOUS | Status: DC
  Administered 2024-08-15 – 2024-08-17 (×3): 6 via TOPICAL

## 2024-08-15 NOTE — Progress Notes (Addendum)
 [facility] Progress Note  Patient: Nicholas Burton     FMW:[id number]  DOA: 08/14/2024   PCP: [place], [facility]       Brief hospital course: This is a 89 year old with hypertension, COPD, heart failure with preserved EF CKD stage III, Charles Bonnett syndrome, dementia presents to the hospital for cough, fatigue/lethargy. In the ED: Found to have a WBC count of 16,200, glucose 425, creatinine 1.53, alk phos 214, troponin 6-x-ray revealed patchy airspace opacity upper lung. Urine with bacteriuria and pyuria Ceftriaxone , azithromycin , 100 cc normal saline Lokelma  and NovoLog  IV.  Subjective:  The patient has no complaints. His caretaker at bedside states that he occasionally chokes with liquids and he was due to have a swallowing evaluation as an outpatient.   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 hallucinations in visually impaired individuals    Prostate cancer metastatic to bone (HCC) - cont Xtandi   BPH with chronic foley, complicated UTI - cont Flomax  and Rocephin     Insulin  dependent type 2 diabetes mellitus - Continue Semglee  and NovoLog  - last A1c 6.9 in 5/25             Code Status: Limited: Do not attempt resuscitation (DNR) -DNR-LIMITED -Do Not Intubate/DNI  Total time on patient care: 35 min DVT prophylaxis:  enoxaparin  (LOVENOX ) injection 30 mg Start: 08/15/24 1000      Objective:   Vitals:   08/15/24 0358 08/15/24 0420 08/15/24 0500 08/15/24 0538  BP:  108/62  119/64  Pulse:  100  94  Resp:  (!) 24  19  Temp: 98.5 F (36.9 C)   98.6 F (37 C)  TempSrc: Oral   Oral  SpO2:  99%  99%  Weight:   74.2 kg   Height:   5' 8 (1.727 m)    Filed Weights   08/15/24 0500  Weight: 74.2 kg   Exam: General exam: Appears comfortable  HEENT: oral mucosa moist Respiratory system: bilateral wheezing and rhonchi, no tachypnea Cardiovascular system: S1 & S2 heard  Gastrointestinal system: Abdomen soft, non-tender, nondistended. Normal bowel sounds   Extremities: No cyanosis, clubbing or edema Psychiatry:  Mood & affect appropriate.      CBC: Recent Labs  Lab 08/14/24 2239  WBC 16.2*  HGB 12.1*  HCT 38.0*  MCV 92.2  PLT 219   Basic Metabolic Panel: Recent Labs  Lab 08/14/24 2239 08/15/24 0305 08/15/24 0545  NA 131*  --   --   K 6.0*  --  5.0  CL 97*  --   --   CO2 25  --   --   GLUCOSE 425*  --   --   BUN 24*  --   --   CREATININE 1.53*  --   --   CALCIUM  9.0  --   --   MG  --  2.1  --      Scheduled Meds:  [START ON 08/16/2024] azithromycin   500 mg Oral Daily   Chlorhexidine  Gluconate Cloth  6 each Topical Daily   enoxaparin  (LOVENOX ) injection  30 mg Subcutaneous Q24H   insulin  aspart  0-5 Units Subcutaneous QHS   insulin  aspart  0-6 Units Subcutaneous TID WC   insulin  glargine-yfgn  10 Units Subcutaneous Daily   metoprolol  tartrate  12.5 mg Oral BID   sodium chloride  flush  3 mL Intravenous Q12H    Imaging and lab data personally reviewed   Author: Joe Tanney  08/15/2024 7:17 AM  To contact [facility]>   Check the care team in [facility] and look for the attending/consulting [facility] provider listed  Log into [email or web address] and use [facility]'s universal password   Go to> [facility]  and find provider  If you still have difficulty reaching the provider, please page the [facility] (Director on Call) for the  Hospitalists listed on amion

## 2024-08-15 NOTE — Progress Notes (Signed)
 [id number] Liaison Note  This is a current GUIDE patient with [facility].  [facility] will continue to follow for discharge disposition.  Please call with any questions or concerns  Thank you, Inocente Jacobs RN, BSN [facility] Liaison  [phone]

## 2024-08-15 NOTE — Evaluation (Signed)
 Clinical/Bedside Swallow Evaluation Patient Details  Name: Nicholas Burton MRN: [id number] Date of Birth: 1927-11-18  Today's Date: 08/15/2024 Time: SLP Start Time (ACUTE ONLY): 1503 SLP Stop Time (ACUTE ONLY): 1520 SLP Time Calculation (min) (ACUTE ONLY): 17 min  Past Medical History:  Past Medical History:  Diagnosis Date   Allergy    ANEMIA ASSOCIATED W/OTHER [facility] NUTRITIONAL DEFIC 08/11/2010   Qualifier: Diagnosis of  By: Joshua MD, Debby CROME.    Cancer [facility])    skin   Cataract    Chronic combined systolic and diastolic congestive heart failure (HCC) 10/30/2018   COPD with asthma (HCC) 03/02/2017   CRI (chronic renal insufficiency), stage 3 (moderate) 10/25/2018   Deficiency anemia 10/25/2018   Diabetes mellitus    type 2   DOE (dyspnea on exertion) 09/03/2018   Edema 06/11/2009   Qualifier: Diagnosis of  By: Joshua MD, Debby CROME.    History of skin cancer    Hyperlipidemia with target LDL less than 100 11/10/2008        Hypertension    Memory loss 05/14/2009   Obesity (BMI 30.0-34.9) 04/01/2013   Osteoarthritis    PSA elevation 10/25/2018   SKIN CANCER, HX OF 11/10/2008   Qualifier: Diagnosis of  By: Joshua MD, Debby CROME.    Snoring 04/03/2014   Type II diabetes mellitus with manifestations (HCC) 11/10/2008   Estimated Creatinine Clearance: 38 mL/min (A) (by C-G formula based on SCr of 1.52 mg/dL (H)).    Venous stasis dermatitis of both lower extremities 01/29/2018   Vitamin D  deficiency 11/10/2008   Past Surgical History:  Past Surgical History:  Procedure Laterality Date   CHOLECYSTECTOMY     INGUINAL HERNIA REPAIR     x 3   IR FLUORO GUIDED NEEDLE PLC ASPIRATION/INJECTION LOC  10/07/2022   JOINT REPLACEMENT     KNEE SURGERY     x 2   MELANOMA EXCISION     x 3 -- Left arm   TOTAL KNEE ARTHROPLASTY     x 3   HPI:  Nicholas Burton is a 89 y.o. male who presented to the hospital on 08/15/2024 from home for cough, fatigue/lethargy. CXR showed patchy airspace opacity in the left upper lung,  suspicious for PNA. He was admitted for PNA, h/o COPD and asthma and SLP swallow evaluation ordered to r/o aspiration. MD note indicated that patient's caretaker reported that he occasionally chokes on liquids and that he was due to have a swallowing evaluation as an outpatient. Of note, patient had an MBS in November of 2024 which reported [facility] for oropharyngeal swallow but showing esophageal retention of barium with retrograde flow below the pharyngoesophageal segment (PES). PMH: dementia, HOH, macular degeneration, COPD, heart failure, Charles Bonnet syndrome.    Assessment / Plan / Recommendation  Clinical Impression  Rec: determine need/benefit for MBS to r/o aspiration but do not anticipate SLP f/u at time of hospital discharge.  Patient did not present with overt s/s of aspiration and oral phase of swallow appeared [facility], however he only accepted a limited amount of PO's including 3-4 straw sips of thin liquid (water) and a few small bites of banana. His cognitive impairment from dementia as well as his visual deficit and HOH (despite hearing aides being in place) did impact his attention to PO intake, resulting in frequent cues from SLP to redirect his attention. Mastication was [facility] and no overt s/s aspiration with thin liquids. Review of 2024 MBS indicates that he likely has an esophageal  phase dysphagia but with oropharyngeal swallow being [facility] with only penetration but no aspiration seen. SLP Visit Diagnosis: Dysphagia, unspecified (R13.10)    Aspiration Risk  Mild aspiration risk    Diet Recommendation Regular;Thin liquid    Liquid Administration via: Cup;Straw Medication Administration: Other (Comment) (as tolerated) Compensations: Slow rate;Small sips/bites;Minimize environmental distractions    Other Recommendations Oral Care Recommendations: Oral care BID;Staff/trained caregiver to provide oral care     Swallow Evaluation Recommendations     Assistance Recommended at Discharge     Functional Status Assessment Patient has had a recent decline in their functional status and demonstrates the ability to make significant improvements in function in a reasonable and predictable amount of time.  Frequency and Duration min 1 x/week  1 week       Prognosis Prognosis for improved oropharyngeal function: Good Barriers to Reach Goals: Cognitive deficits      Swallow Study   General Date of Onset: 08/15/24 HPI: Nicholas Burton is a 89 y.o. male who presented to the hospital on 08/15/2024 from home for cough, fatigue/lethargy. CXR showed patchy airspace opacity in the left upper lung, suspicious for PNA. He was admitted for PNA, h/o COPD and asthma and SLP swallow evaluation ordered to r/o aspiration. MD note indicated that patient's caretaker reported that he occasionally chokes on liquids and that he was due to have a swallowing evaluation as an outpatient. Of note, patient had an MBS in November of 2024 which reported [facility] for oropharyngeal swallow but showing esophageal retention of barium with retrograde flow below the pharyngoesophageal segment (PES). PMH: dementia, HOH, macular degeneration, COPD, heart failure, Charles Bonnet syndrome. Type of Study: Bedside Swallow Evaluation Previous Swallow Assessment: 2024 MBS Diet Prior to this Study: Regular;Thin liquids (Level 0) Temperature Spikes Noted: No Respiratory Status: Room air History of Recent Intubation: No Behavior/Cognition: Alert;Cooperative;Pleasant mood;Confused Oral Cavity Assessment: Dry Oral Care Completed by SLP: No Oral Cavity - Dentition: Adequate natural dentition;Poor condition Vision: Functional for self-feeding Self-Feeding Abilities: Total assist Patient Positioning: Upright in bed Baseline Vocal Quality: Normal Volitional Cough: Cognitively unable to elicit Volitional Swallow: Unable to elicit    Oral/Motor/Sensory Function Overall Oral Motor/Sensory Function: Within functional limits   Ice Chips      Thin Liquid Thin Liquid: Within functional limits Presentation: Straw    Nectar Thick     Honey Thick     Puree     Solid     Solid: Within functional limits      Norleen IVAR Blase, MA, CCC-SLP Speech Therapy  08/15/2024,4:45 PM

## 2024-08-15 NOTE — Consult Note (Addendum)
 WOC Nurse Consult Note: Reason for Consult: Consult requested for right hip.  Performed remotely after review of progress notes and photos in the EMR.  Right hip with previous clear fluid filled blister of unknown etiology; appearance related to probable previous Deep tissue pressure injury which is evolving into full thickness tissue loss/Stage 2 pressure injury,  darker colored redness surrounding, has ruptured and evolved into a full thickness wound, red and moist with loose peeling skin surrounding.  This was noted as present on admission in the bedside nurses' wound care flow sheet.    Dressing procedure/placement/frequency: Topical treatment orders provided for bedside nurses to perform as follows to promote moist healing: Apply Xeroform gauze to right hip Q day, then cover with foam dressing. Change foam dressing Q 3 days or PRN soiling.  Please re-consult if further assistance is needed.  Thank-you,  Stephane Fought MSN, RN, CWOCN, CWCN-AP, CNS Contact Mon-Fri 0700-1500: [phone]

## 2024-08-15 NOTE — H&P (Signed)
 History and Physical    [name] FMW:[id number] DOB: 12/08/1927 DOA: 08/14/2024  PCP: [facility]   Patient coming from: ILF   Chief Complaint: Fatigue, general weakness, cough   HPI: Nicholas Burton is a 89 y.o. male with medical history significant for hypertension, hyperlipidemia, insulin -dependent diabetes mellitus, chronic HFpEF, COPD, CKD 3A, prostate cancer, macular degeneration, Carlin Abrahams syndrome, and dementia who presents with fatigue, generalized weakness, and cough.  Patient is accompanied by one of his sons who assists with the history.  Patient's family felt that the patient was slightly more lethargic and generally weak than usual on 08/13/2024.  His caregivers report that he was significantly more fatigued and generally weak than usual on 08/14/2024, and also noted that he had a significant cough.  Patient's son went to visit him, confirmed that the patient was more weak and lethargic than usual, and the patient actually asked to be brought into the ED for evaluation.  Patient reports feeling generally poor but denies any pain.  EMS was called and the patient was noted to have CBG of 479.  ED Course: Upon arrival to the ED, patient is found to be afebrile and saturating mid 90s on room air with tachypnea, normal HR, and stable BP.  Labs are most notable for glucose 425, potassium 6.0, creatinine 1.53, alkaline phosphatase 214, WBC 16,200, and troponin 69.  Chest x-ray is concerning for patchy airspace opacity in the left upper lung.  Urine is turbid with bacteriuria and pyuria.  Urine was sent for culture and the patient was given 500 mL of NS, Rocephin , azithromycin , Lokelma , and IV NovoLog .  Review of Systems:  All other systems reviewed and apart from HPI, are negative.  Past Medical History:  Diagnosis Date   Allergy    ANEMIA ASSOCIATED W/OTHER [facility] NUTRITIONAL DEFIC 08/11/2010   Qualifier: Diagnosis of  By: Joshua MD, Debby CROME.    Cancer [facility])     skin   Cataract    Chronic combined systolic and diastolic congestive heart failure (HCC) 10/30/2018   COPD with asthma (HCC) 03/02/2017   CRI (chronic renal insufficiency), stage 3 (moderate) 10/25/2018   Deficiency anemia 10/25/2018   Diabetes mellitus    type 2   DOE (dyspnea on exertion) 09/03/2018   Edema 06/11/2009   Qualifier: Diagnosis of  By: Joshua MD, Debby CROME.    History of skin cancer    Hyperlipidemia with target LDL less than 100 11/10/2008        Hypertension    Memory loss 05/14/2009   Obesity (BMI 30.0-34.9) 04/01/2013   Osteoarthritis    PSA elevation 10/25/2018   SKIN CANCER, HX OF 11/10/2008   Qualifier: Diagnosis of  By: Joshua MD, Debby CROME.    Snoring 04/03/2014   Type II diabetes mellitus with manifestations (HCC) 11/10/2008   Estimated Creatinine Clearance: 38 mL/min (A) (by C-G formula based on SCr of 1.52 mg/dL (H)).    Venous stasis dermatitis of both lower extremities 01/29/2018   Vitamin D  deficiency 11/10/2008    Past Surgical History:  Procedure Laterality Date   CHOLECYSTECTOMY     INGUINAL HERNIA REPAIR     x 3   IR FLUORO GUIDED NEEDLE PLC ASPIRATION/INJECTION LOC  10/07/2022   JOINT REPLACEMENT     KNEE SURGERY     x 2   MELANOMA EXCISION     x 3 -- Left arm   TOTAL KNEE ARTHROPLASTY     x 3    Social  History:   reports that he has never smoked. He has never used smokeless tobacco. He reports that he does not drink alcohol and does not use drugs.  Allergies[1]  Family History  Problem Relation Age of Onset   Heart disease Mother    Diabetes Mother    Heart disease Father    Cancer Sister    Cancer Brother    Diabetes Son    Diabetes Other      Prior to Admission medications  Medication Sig Start Date End Date Taking? Authorizing Provider  acetaminophen  (TYLENOL ) 325 MG tablet Take 1-2 tablets (325-650 mg total) by mouth every 6 (six) hours as needed for mild pain. 10/21/22   Angiulli, Toribio PARAS, PA-C  BD AUTOSHIELD DUO 30G X 5 MM MISC USE AS  DIRECTED TO ADMINISTER INSULIN  04/02/24   Joshua Debby CROME, MD  cholecalciferol  (VITAMIN D ) 1000 units tablet Take 1 tablet (1,000 Units total) by mouth daily. 10/21/22   Angiulli, Toribio PARAS, PA-C  cholecalciferol  (VITAMIN D3) 25 MCG (1000 UNIT) tablet Take 1 tablet by mouth once a day 07/04/24     Colesevelam  HCl 3.75 g PACK Take 1 packet by mouth daily. 09/21/23   Joshua Debby CROME, MD  Colesevelam  HCl 3.75 g PACK Take 1 packet (3.75 g total) by mouth daily. 07/04/24     cyanocobalamin  (VITAMIN B12) 500 MCG tablet Take 1 tablet (500 mcg total) by mouth daily. 10/21/22   Angiulli, Toribio PARAS, PA-C  enzalutamide  (XTANDI ) 80 MG tablet Take 1 tablet (80 mg total) by mouth daily. 11/15/23   Onesimo Emaline Brink, MD  enzalutamide  (XTANDI ) 80 MG tablet Take 1 tablet by mouth once a day 07/04/24     insulin  glargine (LANTUS  SOLOSTAR) 100 UNIT/ML Solostar Pen Inject 15 Units into the skin at bedtime. 07/04/24     insulin  glargine (LANTUS ) 100 UNIT/ML injection Inject 5 unit subcutaneously once a day in am 07/04/24     LANTUS  SOLOSTAR 100 UNIT/ML Solostar Pen ADMINISTER 23 UNITS UNDER THE SKIN AT BEDTIME Patient taking differently: Inject 23 Units into the skin at bedtime. 01/08/24   Joshua Debby CROME, MD  magnesium  gluconate (MAGONATE) 500 (27 Mg) MG TABS tablet Take 1 tablet (500 mg total) by mouth at bedtime. 07/04/24     magnesium  gluconate (MAGONATE) 500 MG tablet Take 1 tablet (500 mg total) by mouth at bedtime. 10/21/22   Angiulli, Daniel J, PA-C  melatonin 3 MG TABS tablet Take 1 tablet (3 mg total) by mouth at bedtime as needed. 10/21/22   Angiulli, Daniel J, PA-C  melatonin 3 MG TABS tablet Take 1 tablet by mouth every night at bedtime as needed for insomnia 07/04/24     metoprolol  tartrate (LOPRESSOR ) 25 MG tablet TAKE 1/2 TABLET BY MOUTH TWICE DAILY 02/15/24   Varanasi, Jayadeep S, MD  metoprolol  tartrate (LOPRESSOR ) 25 MG tablet Take 1/2 tablet by mouth twice a day 07/04/24     Multiple Vitamins-Minerals (MULTIVITAL  PO) Take 1 tablet by mouth 2 (two) times daily.    [provider]  Multiple Vitamins-Minerals (MULTIVITAMIN ADULTS 50+) TABS Take 1 tablet by mouth daily. 07/04/24     Multiple Vitamins-Minerals (PRESERVISION AREDS 2) CAPS Take 1 capsule by mouth 2 (two) times daily. 07/04/24     Potassium Chloride  ER 20 MEQ TBCR Take 1 tablet by mouth 3 (three) times daily. 08/14/23   [provider]  Potassium Chloride  ER 20 MEQ TBCR Take 1 tablet by mouth three times a day 07/04/24  tamsulosin  (FLOMAX ) 0.4 MG CAPS capsule TAKE 1 CAPSULE(0.4 MG) BY MOUTH DAILY 03/29/24   Joshua Debby CROME, MD  tamsulosin  (FLOMAX ) 0.4 MG CAPS capsule Take 1 capsule by mouth every night at bedtime 07/04/24     torsemide  (DEMADEX ) 10 MG tablet Take 1 tablet (10 mg total) by mouth every other day. 09/21/23   Joshua Debby CROME, MD  torsemide  (DEMADEX ) 10 MG tablet Take 1 tablet by mouth every other day 07/04/24     Vibegron  (GEMTESA ) 75 MG TABS Take 1 tablet (75 mg total) by mouth daily. 12/06/23   Joshua Debby CROME, MD  vitamin B-12 (CYANOCOBALAMIN ) 500 MCG tablet Take 1 tablet (500 mcg total) by mouth daily. 07/04/24       Physical Exam: Vitals:   08/15/24 0114 08/15/24 0215 08/15/24 0358 08/15/24 0420  BP: 119/62 114/69  108/62  Pulse: 100   100  Resp: 18 (!) 32  (!) 24  Temp:   98.5 F (36.9 C)   TempSrc:   Oral   SpO2: 98%   99%    Constitutional: NAD, no pallor or diaphoresis   Eyes: PERTLA, lids and conjunctivae normal ENMT: Mucous membranes are moist. Posterior pharynx clear of any exudate or lesions.   Neck: supple, no masses  Respiratory: Mild tachypnea. No wheezing. No accessory muscle use.  Cardiovascular: S1 & S2 heard, regular rate and rhythm. Trace lower extremity edema bilaterally.  Abdomen: No tenderness, soft. Bowel sounds active.  Musculoskeletal: no clubbing / cyanosis. No joint deformity upper and lower extremities.   Skin: no significant rashes, lesions, ulcers. Warm, dry,  well-perfused. Neurologic: CN 2-12 grossly intact aside from gross heating and visual acuity deficits. Moving all extremities. Alert and oriented to person and place.  Psychiatric: Calm. Cooperative.    Labs and Imaging on Admission: I have personally reviewed following labs and imaging studies  CBC: Recent Labs  Lab 08/14/24 2239  WBC 16.2*  HGB 12.1*  HCT 38.0*  MCV 92.2  PLT 219   Basic Metabolic Panel: Recent Labs  Lab 08/14/24 2239 08/15/24 0305  NA 131*  --   K 6.0*  --   CL 97*  --   CO2 25  --   GLUCOSE 425*  --   BUN 24*  --   CREATININE 1.53*  --   CALCIUM  9.0  --   MG  --  2.1   GFR: CrCl cannot be calculated (Unknown ideal weight.). Liver Function Tests: Recent Labs  Lab 08/14/24 2239  AST 26  ALT 16  ALKPHOS 214*  BILITOT 0.3  PROT 6.3*  ALBUMIN 3.2*   No results for input(s): LIPASE, AMYLASE in the last 168 hours. No results for input(s): AMMONIA in the last 168 hours. Coagulation Profile: No results for input(s): INR, PROTIME in the last 168 hours. Cardiac Enzymes: No results for input(s): CKTOTAL, CKMB, CKMBINDEX, TROPONINI in the last 168 hours. BNP (last 3 results) Recent Labs    08/14/24 2239  PROBNP 344.0*   HbA1C: No results for input(s): HGBA1C in the last 72 hours. CBG: Recent Labs  Lab 08/14/24 2233 08/15/24 0048 08/15/24 0356  GLUCAP 355* 335* 177*   Lipid Profile: No results for input(s): CHOL, HDL, LDLCALC, TRIG, CHOLHDL, LDLDIRECT in the last 72 hours. Thyroid  Function Tests: No results for input(s): TSH, T4TOTAL, FREET4, T3FREE, THYROIDAB in the last 72 hours. Anemia Panel: No results for input(s): VITAMINB12, FOLATE, FERRITIN, TIBC, IRON, RETICCTPCT in the last 72 hours. Urine analysis:    Component Value  Date/Time   COLORURINE YELLOW 08/14/2024 2239   APPEARANCEUR TURBID (A) 08/14/2024 2239   LABSPEC 1.015 08/14/2024 2239   PHURINE 5.0 08/14/2024 2239    GLUCOSEU >=500 (A) 08/14/2024 2239   GLUCOSEU NEGATIVE 07/26/2022 1406   HGBUR NEGATIVE 08/14/2024 2239   BILIRUBINUR NEGATIVE 08/14/2024 2239   KETONESUR NEGATIVE 08/14/2024 2239   PROTEINUR 100 (A) 08/14/2024 2239   UROBILINOGEN 0.2 07/26/2022 1406   NITRITE NEGATIVE 08/14/2024 2239   LEUKOCYTESUR LARGE (A) 08/14/2024 2239   Sepsis Labs: @LABRCNTIP (procalcitonin:4,lacticidven:4) ) Recent Results (from the past 240 hours)  Resp panel by RT-PCR (RSV, Flu A&B, Covid) Urine, Clean Catch     Status: None   Collection Time: 08/14/24 10:53 PM   Specimen: Urine, Clean Catch; Nasal Swab  Result Value Ref Range Status   SARS Coronavirus 2 by RT PCR NEGATIVE NEGATIVE Final    Comment: (NOTE) SARS-CoV-2 target nucleic acids are NOT DETECTED.  The SARS-CoV-2 RNA is generally detectable in upper respiratory specimens during the acute phase of infection. The lowest concentration of SARS-CoV-2 viral copies this assay can detect is 138 copies/mL. A negative result does not preclude SARS-Cov-2 infection and should not be used as the sole basis for treatment or other patient management decisions. A negative result may occur with  improper specimen collection/handling, submission of specimen other than nasopharyngeal swab, presence of viral mutation(s) within the areas targeted by this assay, and inadequate number of viral copies(<138 copies/mL). A negative result must be combined with clinical observations, patient history, and epidemiological information. The expected result is Negative.  Fact Sheet for Patients:  bloggercourse.com  Fact Sheet for Healthcare Providers:  seriousbroker.it  This test is no t yet approved or cleared by the [place]  FDA and  has been authorized for detection and/or diagnosis of SARS-CoV-2 by FDA under an Emergency Use Authorization (EUA). This EUA will remain  in effect (meaning this test can be used) for  the duration of the COVID-19 declaration under Section 564(b)(1) of the Act, 21 U.S.C.section 360bbb-3(b)(1), unless the authorization is terminated  or revoked sooner.       Influenza A by PCR NEGATIVE NEGATIVE Final   Influenza B by PCR NEGATIVE NEGATIVE Final    Comment: (NOTE) The Xpert Xpress SARS-CoV-2/FLU/RSV plus assay is intended as an aid in the diagnosis of influenza from Nasopharyngeal swab specimens and should not be used as a sole basis for treatment. Nasal washings and aspirates are unacceptable for Xpert Xpress SARS-CoV-2/FLU/RSV testing.  Fact Sheet for Patients: bloggercourse.com  Fact Sheet for Healthcare Providers: seriousbroker.it  This test is not yet approved or cleared by the [place]  FDA and has been authorized for detection and/or diagnosis of SARS-CoV-2 by FDA under an Emergency Use Authorization (EUA). This EUA will remain in effect (meaning this test can be used) for the duration of the COVID-19 declaration under Section 564(b)(1) of the Act, 21 U.S.C. section 360bbb-3(b)(1), unless the authorization is terminated or revoked.     Resp Syncytial Virus by PCR NEGATIVE NEGATIVE Final    Comment: (NOTE) Fact Sheet for Patients: bloggercourse.com  Fact Sheet for Healthcare Providers: seriousbroker.it  This test is not yet approved or cleared by the [place]  FDA and has been authorized for detection and/or diagnosis of SARS-CoV-2 by FDA under an Emergency Use Authorization (EUA). This EUA will remain in effect (meaning this test can be used) for the duration of the COVID-19 declaration under Section 564(b)(1) of the Act, 21 U.S.C. section 360bbb-3(b)(1), unless the authorization is  terminated or revoked.  Performed at [facility], [address] [place], [place] [address]      Radiological Exams on Admission: DG  Chest 2 View Result Date: 08/14/2024 EXAM: 2 VIEW(S) XRAY OF THE CHEST 08/14/2024 11:11:00 PM COMPARISON: 01/15/2024 CLINICAL HISTORY: assess for pneumonia FINDINGS: LUNGS AND PLEURA: Low lung volumes. Patchy airspace opacity in left upper lung. Chronic blunting of left costophrenic sulcus. No pneumothorax. HEART AND MEDIASTINUM: No acute abnormality of the cardiac and mediastinal silhouettes. BONES AND SOFT TISSUES: No acute osseous abnormality. IMPRESSION: 1. Patchy airspace opacity in the left upper lung, suspicious for pneumonia. Electronically signed by: [name] Devonshire MD 08/14/2024 11:29 PM EST RP Workstation: [id number]    EKG: Independently reviewed. Sinus rhythm, RBBB, LAFB.   Assessment/Plan   1. Pneumonia  - Continue Rocephin  and azithromycin , supportive care   2. Hyperkalemia  - Treated in ED with Lokelma  and insulin   - Continue cardiac monitoring, repeat potassium level and treat again if needed    3. CKD 3A  - Appears close to baseline  - Renally-dose medications    4. Insulin -dependent DM  - A1c was 6.9% in May 2025  - Serum glucose is 425 without DKA in ED  - Continue basal insulin , add sliding-scale correctional for now    5. COPD  - Not in exacerbation   6. Chronic HFpEF  - Torsemide  held initially as he has not been eating or drinking much and has mild increase in BUN and creatinine  - Monitor volume status    7. Dementia  - Pt also has Carlin Abrahams syndrome per report of his son  - Delirium precautions   8. Prostate cancer  - Managed with Xtandi     DVT prophylaxis: Lovenox   Code Status: DNR/DNI, confirmed on admission  Level of Care: Level of care: Telemetry Family Communication: Son at bedside  Disposition Plan:  Patient is from: ILF  Anticipated d/c is to: TBD Anticipated d/c date is: 08/17/24  Patient currently: Pending clinical improvement  Consults called: None  Admission status: Inpatient     Evalene GORMAN Sprinkles, MD [facility]  08/15/2024, 5:15 AM       [1]  Allergies Allergen Reactions   Ace Inhibitors Cough   Oxycodone Itching and Rash   Penicillins Itching and Rash

## 2024-08-15 NOTE — ED Notes (Signed)
 Attempted to get his temperature and he is now refusing.

## 2024-08-16 ENCOUNTER — Inpatient Hospital Stay

## 2024-08-16 ENCOUNTER — Inpatient Hospital Stay: Admitting: Hematology

## 2024-08-16 ENCOUNTER — Inpatient Hospital Stay (HOSPITAL_COMMUNITY)

## 2024-08-16 DIAGNOSIS — J189 Pneumonia, unspecified organism: Secondary | ICD-10-CM | POA: Diagnosis not present

## 2024-08-16 LAB — GLUCOSE, CAPILLARY
Glucose-Capillary: 292 mg/dL — ABNORMAL HIGH (ref 70–99)
Glucose-Capillary: 201 mg/dL — ABNORMAL HIGH (ref 70–99)
Glucose-Capillary: 176 mg/dL — ABNORMAL HIGH (ref 70–99)
Glucose-Capillary: 166 mg/dL — ABNORMAL HIGH (ref 70–99)

## 2024-08-16 LAB — HEMOGLOBIN A1C
Hgb A1c MFr Bld: 8.7 % — ABNORMAL HIGH (ref 4.8–5.6)
Mean Plasma Glucose: 202.99 mg/dL

## 2024-08-16 LAB — BASIC METABOLIC PANEL WITH GFR
Anion gap: 10 (ref 5–15)
BUN: 20 mg/dL (ref 8–23)
CO2: 23 mmol/L (ref 22–32)
Calcium: 8.8 mg/dL — ABNORMAL LOW (ref 8.9–10.3)
Chloride: 99 mmol/L (ref 98–111)
Creatinine, Ser: 1.37 mg/dL — ABNORMAL HIGH (ref 0.61–1.24)
GFR, Estimated: 48 mL/min — ABNORMAL LOW
Glucose, Bld: 274 mg/dL — ABNORMAL HIGH (ref 70–99)
Potassium: 4.5 mmol/L (ref 3.5–5.1)
Sodium: 132 mmol/L — ABNORMAL LOW (ref 135–145)

## 2024-08-16 LAB — URINE CULTURE

## 2024-08-16 LAB — HEPATIC FUNCTION PANEL
ALT: 14 U/L (ref 0–44)
AST: 24 U/L (ref 15–41)
Albumin: 2.7 g/dL — ABNORMAL LOW (ref 3.5–5.0)
Alkaline Phosphatase: 175 U/L — ABNORMAL HIGH (ref 38–126)
Bilirubin, Direct: 0.1 mg/dL (ref 0.0–0.2)
Indirect Bilirubin: 0.2 mg/dL — ABNORMAL LOW (ref 0.3–0.9)
Total Bilirubin: 0.3 mg/dL (ref 0.0–1.2)
Total Protein: 5.6 g/dL — ABNORMAL LOW (ref 6.5–8.1)

## 2024-08-16 LAB — CBC
HCT: 35 % — ABNORMAL LOW (ref 39.0–52.0)
Hemoglobin: 11.2 g/dL — ABNORMAL LOW (ref 13.0–17.0)
MCH: 28.8 pg (ref 26.0–34.0)
MCHC: 32 g/dL (ref 30.0–36.0)
MCV: 90 fL (ref 80.0–100.0)
Platelets: 217 K/uL (ref 150–400)
RBC: 3.89 MIL/uL — ABNORMAL LOW (ref 4.22–5.81)
RDW: 15 % (ref 11.5–15.5)
WBC: 14.3 K/uL — ABNORMAL HIGH (ref 4.0–10.5)
nRBC: 0 % (ref 0.0–0.2)

## 2024-08-16 LAB — POTASSIUM
Potassium: 4.8 mmol/L (ref 3.5–5.1)
Potassium: 4.3 mmol/L (ref 3.5–5.1)

## 2024-08-16 NOTE — Plan of Care (Signed)

## 2024-08-16 NOTE — Progress Notes (Signed)
 " [facility] Progress Note  Patient: Nicholas Burton     FMW:[id number]  DOA: 08/14/2024   PCP: [facility], [facility]       Brief hospital course: This is a 89 year old with hypertension, COPD, heart failure with preserved EF CKD stage III, Charles Bonnett syndrome, dementia presents to the hospital for cough, fatigue/lethargy. In the ED: Found to have a WBC count of 16,200, glucose 425, creatinine 1.53, alk phos 214, troponin 6-x-ray revealed patchy airspace opacity upper lung. Urine with bacteriuria and pyuria Ceftriaxone , azithromycin , 100 cc normal saline Lokelma  and NovoLog  IV.  Subjective:  His caretaker states that the patient may have had some numbness in his right hand which lasted for a few minutes earlier today.  Assessment and Plan: Principal Problem:   Pneumonia - h/o COPD and Asthma - WBC 16 - LUL infiltrate - mild bilateral rhonchi and wheezing - Continue ceftriaxone  and azithromycin  -  WBC down to 14 today - cont IV antibiotics for today  Active Problems:  Dysphagia?  - esophagram showing esophageal dysmotility - also noted to have some trouble with tablet passing into the stomach- it did pass after multiple swallows of liquid - discussed these findings with his son Prentice- he is not interested in further work up at this time - discussed swallowing precautions as well  Hyperkalemia - Treated and improved from 6.0-5.0  Mild hyponatremia, dehydrated in setting of chronic HFpEF - Na 131 - hold Demadex   Elevated Troponin - 69 and 70    CRI (chronic renal insufficiency), stage 3a - Cr at baseline ~ 1.3- 1.5   Late onset Alzheimer's dementia without behavioral disturbance Carlin Abrahams Syndrome/ visual impairment -  Characterized by visual hallucinations in visually impaired individuals    Prostate cancer metastatic to bone (HCC) - cont Xtandi   BPH with chronic foley, complicated UTI? - UA showing multiple species- likely not a UTI     Insulin  dependent type 2 diabetes mellitus - Continue Semglee  and NovoLog  - last A1c 6.9 in 5/25        Code Status: Limited: Do not attempt resuscitation (DNR) -DNR-LIMITED -Do Not Intubate/DNI  Total time on patient care: 35 min DVT prophylaxis:  enoxaparin  (LOVENOX ) injection 30 mg Start: 08/15/24 1000   Objective:   Vitals:   08/15/24 1617 08/15/24 2156 08/16/24 0437 08/16/24 1246  BP: (!) 142/80 122/65 (!) 154/73 106/62  Pulse: 93 95 100 90  Resp: 16 (!) 24 (!) 22 18  Temp: 98.9 F (37.2 C) 98.6 F (37 C) 99.4 F (37.4 C) 98.4 F (36.9 C)  TempSrc: Oral     SpO2: 96% 93% 94% 96%  Weight:      Height:       Filed Weights   08/15/24 0500  Weight: 74.2 kg   Exam: General exam: Appears comfortable  HEENT: oral mucosa moist Respiratory system: clear to auscultation today Cardiovascular system: S1 & S2 heard  Gastrointestinal system: Abdomen soft, non-tender, nondistended. Normal bowel sounds   Extremities: No cyanosis, clubbing or edema Psychiatry:  Mood & affect appropriate.      CBC: Recent Labs  Lab 08/14/24 2239 08/16/24 0820  WBC 16.2* 14.3*  HGB 12.1* 11.2*  HCT 38.0* 35.0*  MCV 92.2 90.0  PLT 219 217   Basic Metabolic Panel: Recent Labs  Lab 08/14/24 2239 08/15/24 0305 08/15/24 0545 08/15/24 1104 08/15/24 1337 08/15/24 1743 08/15/24 2048 08/16/24 0134 08/16/24 0820 08/16/24 1258  NA 131*  --   --  137  --   --   --   --  132*  --   K 6.0*  --    < > 5.0   < > 5.1 5.0 4.8 4.5 4.3  CL 97*  --   --  105  --   --   --   --  99  --   CO2 25  --   --  25  --   --   --   --  23  --   GLUCOSE 425*  --   --  134*  --   --   --   --  274*  --   BUN 24*  --   --  21  --   --   --   --  20  --   CREATININE 1.53*  --   --  1.29*  --   --   --   --  1.37*  --   CALCIUM  9.0  --   --  9.0  --   --   --   --  8.8*  --   MG  --  2.1  --   --   --   --   --   --   --   --    < > = values in this interval not displayed.     Scheduled Meds:   azithromycin   500 mg Oral Daily   Chlorhexidine  Gluconate Cloth  6 each Topical Daily   enoxaparin  (LOVENOX ) injection  30 mg Subcutaneous Q24H   enzalutamide   80 mg Oral QHS   insulin  aspart  0-5 Units Subcutaneous QHS   insulin  aspart  0-6 Units Subcutaneous TID WC   insulin  glargine-yfgn  10 Units Subcutaneous Daily   magnesium  gluconate  500 mg Oral QHS   metoprolol  tartrate  12.5 mg Oral BID   sodium chloride  flush  3 mL Intravenous Q12H   tamsulosin   0.4 mg Oral QPC supper    Imaging and lab data personally reviewed   Author: Jaycey Gens  08/16/2024 1:38 PM  To contact [facility]>   Check the care team in [facility] and look for the attending/consulting [facility] provider listed  Log into [email or web address] and use [place] universal password   Go to> [facility]  and find provider  If you still have difficulty reaching the provider, please page the [facility] (Director on Call) for the Hospitalists listed on amion     "

## 2024-08-16 NOTE — Progress Notes (Signed)
 As per MD Tele is discontinued.

## 2024-08-16 NOTE — Progress Notes (Signed)
 Speech Language Pathology Treatment: Dysphagia  Patient Details Name: Nicholas Burton MRN: [id number] DOB: 03/21/1928 Today's Date: 08/16/2024 Time: 9062-8996 SLP Time Calculation (min) (ACUTE ONLY): 26 min  Assessment / Plan / Recommendation Clinical Impression  Pt seen for SLP follow up to assess diet tolerance and discuss plan for further instrumental swallow assessment as needed. Spoke with pt's son, Nicholas Burton, on phone and pt's caregiver. Per Nicholas Burton, pt's PCP recently ordered a barium esophagram to be completed with [facility]; however, they have not received a call to schedule the test yet. This clinical research associate reviewed the differences between a MBSS and an esophagram. Discussed with Nicholas Burton that an esophagram is likely the next step based on MBSS results in Nov 2024 that demonstrated a normal oropharyngeal swallow and concerns for esophageal dysmotility, which elevates Nicholas Burton's risk of post prandial aspiration.   We agreed to proceed with esophagram first and we will add on a MBSS if the esophagram shows concerns for aspiration. Case discussed with Dr. Earley who ordered esophagram.   SLP observed pt consume multiple sips of liquids. Observed delayed, subtle, dry cough x1 instance following multiple sips. Pt's caregiver reported he consumed a good portion of his breakfast tray and no swallowing concerns were observed. Pt does have globus sensation with PO meds.   Plan: Esophagram pending. SLP will follow up to view results and add on MBSS if needed.    HPI HPI: Nicholas Burton is a 89 y.o. male who presented to the hospital on 08/15/2024 from home for cough, fatigue/lethargy. CXR showed patchy airspace opacity in the left upper lung, suspicious for PNA. He was admitted for PNA, h/o COPD and asthma and SLP swallow evaluation ordered to r/o aspiration. MD note indicated that patient's caretaker reported that he occasionally chokes on liquids and that he was due to have a swallowing evaluation as an outpatient. Of  note, patient had an MBS in November of 2024 which reported [facility] for oropharyngeal swallow but showing esophageal retention of barium with retrograde flow below the pharyngoesophageal segment (PES). PMH: dementia, HOH, macular degeneration, COPD, heart failure, Charles Bonnet syndrome.      SLP Plan  Continue with current plan of care (esophagram)         Recommendations  Diet recommendations: Regular;Thin liquid Liquids provided via: Cup;Straw Medication Administration:  (as tolerated) Supervision: Staff to assist with self feeding;Full supervision/cueing for compensatory strategies Compensations: Slow rate;Small sips/bites;Minimize environmental distractions Postural Changes and/or Swallow Maneuvers: Seated upright 90 degrees;Upright 30-60 min after meal                  Oral care BID;Staff/trained caregiver to provide oral care   Frequent or constant Supervision/Assistance Dysphagia, unspecified (R13.10)     Continue with current plan of care (esophagram)     Nicholas Burton  08/16/2024, 10:40 AM

## 2024-08-16 NOTE — Progress Notes (Signed)
 SLP Cancellation Note  Patient Details Name: Nicholas Burton MRN: [id number] DOB: [birth year]   Cancelled treatment:       Esophagram results reviewed. IMPRESSION:1. Grossly normal oral and pharyngeal phase of swallowing, with no laryngeal penetration or tracheobronchial aspiration observed.  2. Moderate esophageal dysmotility with presbyesophagus pattern. 3. Possible mild peptic stricture in the lower thoracic esophagus.  No further SLP needs are identified. Recommend follow up with GI if pt wants to pursue further assessment and intervention for esophageal dysmotility and questionable stricture.    Melissaann Dizdarevic J Trinidee Schrag 08/16/2024, 2:54 PM

## 2024-08-16 NOTE — Inpatient Diabetes Management (Signed)
 Inpatient Diabetes Program Recommendations  AACE/ADA: New Consensus Statement on Inpatient Glycemic Control (2015)  Target Ranges:  Prepandial:   less than 140 mg/dL      Peak postprandial:   less than 180 mg/dL (1-2 hours)      Critically ill patients:  140 - 180 mg/dL   Lab Results  Component Value Date   GLUCAP 201 (H) 08/16/2024   HGBA1C 8.7 (H) 08/16/2024    Review of Glycemic Control  Latest Reference Range & Units 08/15/24 07:13 08/15/24 11:29 08/15/24 16:16 08/15/24 21:58 08/16/24 07:35 08/16/24 11:20  Glucose-Capillary 70 - 99 mg/dL 844 (H) 883 (H) 761 (H) 205 (H) 292 (H) 201 (H)  (H): Data is abnormally high  Diabetes history: DM2 Outpatient Diabetes medications: Lantus  5 units-Not taking Current orders for Inpatient glycemic control: Novolog  0-6 units TID and 0-5 units at bedtime, Lantus  10 units QD  Inpatient Diabetes Program Recommendations:    Might consider:  Semglee  12 units every day.  Thank you, Wyvonna Pinal, MSN, CDCES Diabetes Coordinator Inpatient Diabetes Program [phone] (team pager from 8a-5p)

## 2024-08-17 ENCOUNTER — Other Ambulatory Visit (HOSPITAL_COMMUNITY): Payer: Self-pay

## 2024-08-17 ENCOUNTER — Other Ambulatory Visit: Payer: Self-pay

## 2024-08-17 DIAGNOSIS — J189 Pneumonia, unspecified organism: Secondary | ICD-10-CM | POA: Diagnosis not present

## 2024-08-17 LAB — BASIC METABOLIC PANEL WITH GFR
Anion gap: 11 (ref 5–15)
BUN: 18 mg/dL (ref 8–23)
CO2: 23 mmol/L (ref 22–32)
Calcium: 8.7 mg/dL — ABNORMAL LOW (ref 8.9–10.3)
Chloride: 102 mmol/L (ref 98–111)
Creatinine, Ser: 1.4 mg/dL — ABNORMAL HIGH (ref 0.61–1.24)
GFR, Estimated: 47 mL/min — ABNORMAL LOW
Glucose, Bld: 182 mg/dL — ABNORMAL HIGH (ref 70–99)
Potassium: 4.2 mmol/L (ref 3.5–5.1)
Sodium: 136 mmol/L (ref 135–145)

## 2024-08-17 LAB — CBC
HCT: 35.5 % — ABNORMAL LOW (ref 39.0–52.0)
Hemoglobin: 11.3 g/dL — ABNORMAL LOW (ref 13.0–17.0)
MCH: 29.1 pg (ref 26.0–34.0)
MCHC: 31.8 g/dL (ref 30.0–36.0)
MCV: 91.5 fL (ref 80.0–100.0)
Platelets: 201 K/uL (ref 150–400)
RBC: 3.88 MIL/uL — ABNORMAL LOW (ref 4.22–5.81)
RDW: 15 % (ref 11.5–15.5)
WBC: 13.5 K/uL — ABNORMAL HIGH (ref 4.0–10.5)
nRBC: 0 % (ref 0.0–0.2)

## 2024-08-17 LAB — GLUCOSE, CAPILLARY
Glucose-Capillary: 170 mg/dL — ABNORMAL HIGH (ref 70–99)
Glucose-Capillary: 158 mg/dL — ABNORMAL HIGH (ref 70–99)

## 2024-08-17 MED ORDER — POTASSIUM CHLORIDE ER 20 MEQ PO TBCR: 1.0000 | EXTENDED_RELEASE_TABLET | ORAL | 11 refills | Status: AC

## 2024-08-17 MED ORDER — CEFUROXIME AXETIL 500 MG PO TABS
500.0000 mg | ORAL_TABLET | Freq: Two times a day (BID) | ORAL | 0 refills | Status: AC
  Filled 2024-08-17: qty 10, 5d supply, fill #0
  Filled 2024-08-17: qty 4, 2d supply, fill #0
  Filled 2024-08-17: qty 10, 5d supply, fill #0

## 2024-08-17 MED ORDER — AZITHROMYCIN 250 MG PO TABS
250.0000 mg | ORAL_TABLET | Freq: Every day | ORAL | 0 refills | Status: AC
  Filled 2024-08-17: qty 3, 3d supply, fill #0

## 2024-08-17 NOTE — Discharge Summary (Signed)
 Physician Discharge Summary  TITAN KARNER FMW:[id number] DOB: 07-11-1928 DOA: 08/14/2024  PCP: [facility]  Admit date: 08/14/2024 Discharge date: 08/17/2024 Discharging to: home Recommendations for Outpatient Follow-up:  Continue swallowing precautions     Discharge Diagnoses:   Principal Problem:   Pneumonia Active Problems:   COPD with asthma (HCC)   CRI (chronic renal insufficiency), stage 3 (moderate)   Chronic diastolic CHF (congestive heart failure) (HCC)   Late onset Alzheimer's dementia without behavioral disturbance (HCC)   Prostate cancer metastatic to bone (HCC)   Insulin  dependent type 2 diabetes mellitus (HCC)   Hyperkalemia     Brief hospital course: This is an 89 year old with hypertension, COPD, heart failure with preserved EF CKD stage III, Charles Bonnett syndrome, dementia presents to the hospital for cough, fatigue/lethargy. In the ED: Found to have a WBC count of 16,200, glucose 425, creatinine 1.53, alk phos 214, troponin 6-x-ray revealed patchy airspace opacity upper lung. Urine with bacteriuria and pyuria Ceftriaxone , azithromycin , 100 cc normal saline Lokelma  and NovoLog  IV.   Subjective:  His caretaker states that the patient may have had some numbness in his right hand which lasted for a few minutes earlier today.   Assessment and Plan: Principal Problem:   Pneumonia - h/o COPD and Asthma - WBC 16 - LUL infiltrate - mild bilateral rhonchi and wheezing - treated with ceftriaxone  and azithromycin  - now asymptomatic -  WBC down to 13- switched to oral antibiotics   Active Problems:   Dysphagia?  - esophagram showing esophageal dysmotility - also noted to have some trouble with tablet passing into the stomach- it did pass after multiple swallows of liquid - discussed these findings with his son Prentice- he is not interested in further work up at this time - discussed swallowing precautions with son and with his primary caretake    Hyperkalemia - Treated and improved from 6.0-5.0 - have reduced his outpatient dose of K+   Mild hyponatremia, dehydrated in setting of chronic HFpEF - Na 131 - holding Demadex  during hospital stay   Elevated Troponin - 69 and 70     CRI (chronic renal insufficiency), stage 3a - Cr at baseline ~ 1.3- 1.5    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 - UA showing multiple species- likely not a UTI     Insulin  dependent type 2 diabetes mellitus - Continue Semglee  and NovoLog  - last A1c 6.9 in 5/25            Wound 08/15/24 1038 Pressure Injury Hip Right Deep Tissue Pressure Injury - Purple or maroon localized area of discolored intact skin or blood-filled blister due to damage of underlying soft tissue from pressure and/or shear. (Active)   Discharge Instructions   Allergies as of 08/17/2024       Reactions   Ace Inhibitors Cough   Oxycodone Itching, Rash   Penicillins Itching, Rash        Medication List     STOP taking these medications    Colesevelam  HCl 3.75 g Pack       TAKE these medications    acetaminophen  325 MG tablet Commonly known as: TYLENOL  Take 1-2 tablets (325-650 mg total) by mouth every 6 (six) hours as needed for mild pain.   azithromycin  250 MG tablet Commonly  known as: ZITHROMAX  Take 1 tablet (250 mg total) by mouth daily for 3 days.   BD AutoShield Duo 30G X 5 MM Misc Generic drug: Insulin  Pen Needle USE AS DIRECTED TO ADMINISTER INSULIN    cefUROXime  500 MG tablet Commonly known as: CEFTIN  Take 1 tablet (500 mg total) by mouth 2 (two) times daily with a meal for 7 days.   PreserVision/Lutein Caps Take 1 capsule by mouth in the morning and at bedtime. What changed: Another medication  with the same name was removed. Continue taking this medication, and follow the directions you see here.   CertaVite/Antioxidants Tabs Take 1 tablet by mouth daily. What changed: Another medication with the same name was removed. Continue taking this medication, and follow the directions you see here.   cholecalciferol  25 MCG (1000 UNIT) tablet Commonly known as: VITAMIN D3 Take 1 tablet by mouth once a day What changed: Another medication with the same name was removed. Continue taking this medication, and follow the directions you see here.   cyanocobalamin  500 MCG tablet Commonly known as: VITAMIN B12 Take 1 tablet (500 mcg total) by mouth daily.   vitamin B-12 500 MCG tablet Commonly known as: CYANOCOBALAMIN  Take 1 tablet (500 mcg total) by mouth daily.   enzalutamide  80 MG tablet Commonly known as: Xtandi  Take 1 tablet (80 mg total) by mouth daily. What changed:  when to take this Another medication with the same name was removed. Continue taking this medication, and follow the directions you see here.   FERROUS FUMARATE-FOLIC ACID  PO Take 1 tablet by mouth See admin instructions. Multivitamin-ferrous fumarate-folic acid  18 mg-400 mcg tablet - Take 1 tablet by mouth once a day with breakfast   Gemtesa  75 MG Tabs Generic drug: Vibegron  Take 1 tablet (75 mg total) by mouth daily.   insulin  aspart 100 UNIT/ML injection Commonly known as: novoLOG  Inject 6-10 Units into the skin as needed for high blood sugar (PER SLIDING SCALE).   ipratropium-albuterol  0.5-2.5 (3) MG/3ML Soln Commonly known as: DUONEB Take 3 mLs by nebulization every 6 (six) hours as needed (for shortness for breath).   Lantus  SoloStar 100 UNIT/ML Solostar Pen Generic drug: insulin  glargine Inject 15 Units into the skin at bedtime. What changed:  additional instructions Another medication with the same name was removed. Continue taking this medication, and follow the directions you see here.   Mag-G  500 (27 Mg) MG Tabs tablet Generic drug: magnesium  gluconate Take 1 tablet (500 mg total) by mouth at bedtime.   melatonin 3 MG Tabs tablet Take 1 tablet by mouth every night at bedtime as needed for insomnia What changed: Another medication with the same name was removed. Continue taking this medication, and follow the directions you see here.   metoprolol  tartrate 25 MG tablet Commonly known as: LOPRESSOR  TAKE 1/2 TABLET BY MOUTH TWICE DAILY What changed: Another medication with the same name was removed. Continue taking this medication, and follow the directions you see here.   Potassium Chloride  ER 20 MEQ Tbcr Take 1 tablet (20 mEq total) by mouth every other day. Take with demadex  What changed:  when to take this additional instructions   Systane Complete 0.6 % Soln Generic drug: Propylene Glycol Place 1 drop into both eyes 3 (three) times daily as needed (for dryness).   tamsulosin  0.4 MG Caps capsule Commonly known as: FLOMAX  Take 1 capsule by mouth every night at bedtime What changed: Another medication with the same name was removed. Continue taking this medication, and  follow the directions you see here.   torsemide  10 MG tablet Commonly known as: DEMADEX  Take 1 tablet by mouth every other day What changed:  when to take this Another medication with the same name was removed. Continue taking this medication, and follow the directions you see here.            The results of significant diagnostics from this hospitalization (including imaging, microbiology, ancillary and laboratory) are listed below for reference.    DG ESOPHAGUS W SINGLE CM (SOL OR THIN BA) Result Date: 08/16/2024 CLINICAL DATA:  89 year old male inpatient with dysphagia with coughing episodes and globus sensation in the chest. EXAM: ESOPHOGRAM/BARIUM SWALLOW TECHNIQUE: Single contrast examination was performed using  thin barium. FLUOROSCOPY: Radiation Exposure Index (as provided by the  fluoroscopic device): 38.3 mGy Kerma COMPARISON:  10/11/2022 CT chest, abdomen and pelvis. FINDINGS: Very limited examination due to patient mobility limitations. Grossly normal oral and pharyngeal phases of swallowing with no evidence of laryngeal penetration or tracheobronchial aspiration. Moderate esophageal dysmotility characterized by prominent tertiary contractions and proximal scaphoid, compatible with presbyesophagus pattern. No hiatal hernia. Mild smooth narrowing of the lower thoracic esophagus, at which location the swallowed 13 mm barium tablet became lodged for several minutes despite multiple water and barium swallows, with eventual clearance of the tablet into the stomach with multiple upright barium swallows. No gross esophageal mass. IMPRESSION: 1. Grossly normal oral and pharyngeal phase of swallowing, with no laryngeal penetration or tracheobronchial aspiration observed. 2. Moderate esophageal dysmotility with presbyesophagus pattern. 3. Possible mild peptic stricture in the lower thoracic esophagus, see comments. Electronically Signed   By: Selinda DELENA Blue M.D.   On: 08/16/2024 12:47   DG Chest 2 View Result Date: 08/14/2024 EXAM: 2 VIEW(S) XRAY OF THE CHEST 08/14/2024 11:11:00 PM COMPARISON: 01/15/2024 CLINICAL HISTORY: assess for pneumonia FINDINGS: LUNGS AND PLEURA: Low lung volumes. Patchy airspace opacity in left upper lung. Chronic blunting of left costophrenic sulcus. No pneumothorax. HEART AND MEDIASTINUM: No acute abnormality of the cardiac and mediastinal silhouettes. BONES AND SOFT TISSUES: No acute osseous abnormality. IMPRESSION: 1. Patchy airspace opacity in the left upper lung, suspicious for pneumonia. Electronically signed by: [name] Devonshire MD 08/14/2024 11:29 PM EST RP Workstation: [id number]   Labs:   Basic Metabolic Panel: Recent Labs  Lab 08/14/24 2239 08/15/24 0305 08/15/24 0545 08/15/24 1104 08/15/24 1337 08/15/24 2048 08/16/24 0134 08/16/24 0820 08/16/24 1258  08/17/24 0540  NA 131*  --   --  137  --   --   --  132*  --  136  K 6.0*  --    < > 5.0   < > 5.0 4.8 4.5 4.3 4.2  CL 97*  --   --  105  --   --   --  99  --  102  CO2 25  --   --  25  --   --   --  23  --  23  GLUCOSE 425*  --   --  134*  --   --   --  274*  --  182*  BUN 24*  --   --  21  --   --   --  20  --  18  CREATININE 1.53*  --   --  1.29*  --   --   --  1.37*  --  1.40*  CALCIUM  9.0  --   --  9.0  --   --   --  8.8*  --  8.7*  MG  --  2.1  --   --   --   --   --   --   --   --    < > = values in this interval not displayed.     CBC: Recent Labs  Lab 08/14/24 2239 08/16/24 0820 08/17/24 0540  WBC 16.2* 14.3* 13.5*  HGB 12.1* 11.2* 11.3*  HCT 38.0* 35.0* 35.5*  MCV 92.2 90.0 91.5  PLT 219 217 201         SIGNED:   True Atlas, MD  [facility] 08/17/2024, 9:14 AM Time taking on discharge: 50 minutes

## 2024-08-17 NOTE — Plan of Care (Signed)
  Problem: Education: Goal: Knowledge of General Education information will improve Description: Including pain rating scale, medication(s)/side effects and non-pharmacologic comfort measures Outcome: Adequate for Discharge   Problem: Health Behavior/Discharge Planning: Goal: Ability to manage health-related needs will improve Outcome: Adequate for Discharge   Problem: Clinical Measurements: Goal: Ability to maintain clinical measurements within normal limits will improve Outcome: Adequate for Discharge Goal: Will remain free from infection Outcome: Adequate for Discharge Goal: Diagnostic test results will improve Outcome: Adequate for Discharge Goal: Respiratory complications will improve Outcome: Adequate for Discharge Goal: Cardiovascular complication will be avoided Outcome: Adequate for Discharge   Problem: Activity: Goal: Risk for activity intolerance will decrease Outcome: Adequate for Discharge   Problem: Nutrition: Goal: Adequate nutrition will be maintained Outcome: Adequate for Discharge   Problem: Coping: Goal: Level of anxiety will decrease Outcome: Adequate for Discharge   Problem: Elimination: Goal: Will not experience complications related to bowel motility Outcome: Adequate for Discharge Goal: Will not experience complications related to urinary retention Outcome: Adequate for Discharge   Problem: Pain Managment: Goal: General experience of comfort will improve and/or be controlled Outcome: Adequate for Discharge   Problem: Safety: Goal: Ability to remain free from injury will improve Outcome: Adequate for Discharge   Problem: Skin Integrity: Goal: Risk for impaired skin integrity will decrease Outcome: Adequate for Discharge   Problem: Activity: Goal: Ability to tolerate increased activity will improve Outcome: Adequate for Discharge   Problem: Clinical Measurements: Goal: Ability to maintain a body temperature in the normal range will  improve Outcome: Adequate for Discharge   Problem: Respiratory: Goal: Ability to maintain adequate ventilation will improve Outcome: Adequate for Discharge Goal: Ability to maintain a clear airway will improve Outcome: Adequate for Discharge   Problem: Education: Goal: Ability to describe self-care measures that may prevent or decrease complications (Diabetes Survival Skills Education) will improve Outcome: Adequate for Discharge Goal: Individualized Educational Video(s) Outcome: Adequate for Discharge   Problem: Coping: Goal: Ability to adjust to condition or change in health will improve Outcome: Adequate for Discharge   Problem: Fluid Volume: Goal: Ability to maintain a balanced intake and output will improve Outcome: Adequate for Discharge   Problem: Health Behavior/Discharge Planning: Goal: Ability to identify and utilize available resources and services will improve Outcome: Adequate for Discharge Goal: Ability to manage health-related needs will improve Outcome: Adequate for Discharge   Problem: Metabolic: Goal: Ability to maintain appropriate glucose levels will improve Outcome: Adequate for Discharge   Problem: Nutritional: Goal: Maintenance of adequate nutrition will improve Outcome: Adequate for Discharge Goal: Progress toward achieving an optimal weight will improve Outcome: Adequate for Discharge   Problem: Skin Integrity: Goal: Risk for impaired skin integrity will decrease Outcome: Adequate for Discharge   Problem: Tissue Perfusion: Goal: Adequacy of tissue perfusion will improve Outcome: Adequate for Discharge

## 2024-08-17 NOTE — Plan of Care (Signed)

## 2024-08-17 NOTE — TOC Progression Note (Signed)
 Transition of Care [facility]) - Progression Note    Patient Details  Name: Nicholas Burton MRN: [id number] Date of Birth: 1928/02/21  Transition of Care [facility] CM/SW Contact  Sonda Manuella Quill, RN Phone Number: 08/17/2024, 11:13 AM  Clinical Narrative:     Attempted to contact pt's spouse Sully Dyment [phone]); unable to leave message; LVM for pt's son Koree Schopf ([phone]); awaiting return call to complete assessment.                    Expected Discharge Plan and Services         Expected Discharge Date: 08/17/24                                     Social Drivers of Health (SDOH) Interventions SDOH Screenings   Food Insecurity: No Food Insecurity (08/15/2024)  Housing: Low Risk (08/15/2024)  Transportation Needs: No Transportation Needs (08/15/2024)  Utilities: Not At Risk (08/15/2024)  Alcohol Screen: Low Risk (07/13/2023)  Depression (PHQ2-9): Low Risk (10/27/2022)  Financial Resource Strain: Low Risk (07/13/2023)  Physical Activity: Sufficiently Active (07/13/2023)  Social Connections: Moderately Isolated (08/15/2024)  Stress: Patient Unable To Answer (07/13/2023)  Tobacco Use: Low Risk (08/15/2024)  Health Literacy: Inadequate Health Literacy (07/13/2023)    Readmission Risk Interventions     No data to display

## 2024-08-17 NOTE — Progress Notes (Signed)
 Discharge meds in a secure bag delivered to patient by this RN. This RN updated the patient and wife on 2 different manufactures for ceftin 

## 2024-08-17 NOTE — TOC Initial Note (Signed)
 Transition of Care [facility]) - Initial/Assessment Note    Patient Details  Name: Nicholas Burton MRN: [id number] Date of Birth: 1928-04-09  Transition of Care [facility]) CM/SW Contact:    Sonda Manuella Quill, RN Phone Number: 08/17/2024, 1:07 PM  Clinical Narrative:                 Beatris w/ pt's sons Larz Seraj (son) [phone] / Makana Rostad (son) [phone] ; they say pr fro [place]; they plan for him to return at d/c w/ support from caregivers 8a-9p thru [employer]; Tod will provide transportation; pt does not have homeo oxygen; no IP CM needs.  Expected Discharge Plan: Home/Self Care Barriers to Discharge: No Barriers Identified   Patient Goals and CMS Choice Patient states their goals for this hospitalization and ongoing recovery are:: return to [facility] [place]          Expected Discharge Plan and Services   Discharge Planning Services: CM Consult   Living arrangements for the past 2 months: Independent Living Facility Expected Discharge Date: 08/17/24               DME Arranged: N/A DME Agency: NA       HH Arranged: NA HH Agency: NA        Prior Living Arrangements/Services Living arrangements for the past 2 months: Independent Living Facility Lives with:: Facility Resident Patient language and need for interpreter reviewed:: Yes Do you feel safe going back to the place where you live?: Yes      Need for Family Participation in Patient Care: Yes (Comment) Care giver support system in place?: Yes (comment) Current home services: DME (walker, wheelchair, shower chair; HHPT/OT thru [facility]) Criminal Activity/Legal Involvement Pertinent to Current Situation/Hospitalization: No - Comment as needed  Activities of Daily Living   ADL Screening (condition at time of admission) Independently performs ADLs?: Yes (appropriate for developmental age) Is the patient deaf or have difficulty hearing?: No Does the patient have difficulty seeing, even when  wearing glasses/contacts?: No Does the patient have difficulty concentrating, remembering, or making decisions?: No  Permission Sought/Granted Permission sought to share information with : Case Manager Permission granted to share information with : Yes, Verbal Permission Granted  Share Information with NAME: Case Manager     Permission granted to share info w Relationship: Heliodoro Domagalski (son) [phone] / Lothar Prehn (son) [phone]     Emotional Assessment Appearance:: Appears stated age Attitude/Demeanor/Rapport: Unable to Assess Affect (typically observed): Unable to Assess Orientation: :  (unable to assess) Alcohol / Substance Use: Not Applicable Psych Involvement: No (comment)  Admission diagnosis:  Pneumonia [J18.9] Patient Active Problem List   Diagnosis Date Noted   Pneumonia 08/15/2024   Hyperkalemia 08/15/2024   Acute encephalopathy 01/14/2024   Sensorineural hearing loss, bilateral 10/19/2023   Need for immunization against influenza 06/22/2023   Insulin  dependent type 2 diabetes mellitus (HCC) 02/20/2023   Chronic combined systolic and diastolic congestive heart failure (HCC) 02/20/2023   Bilateral leg edema 11/24/2022   Prostate cancer metastatic to bone (HCC) 10/13/2022   Counseling regarding advance care planning and goals of care 10/13/2022   Spinal stenosis of lumbar region with neurogenic claudication 09/21/2022   Encounter for palliative care involving management of pain 09/21/2022   Impacted cerumen of right ear 04/25/2022   Subacute cough 09/21/2020   Late onset Alzheimer's dementia without behavioral disturbance (HCC) 09/21/2020   Swimmer's ear, right 06/06/2019   Iron deficiency anemia secondary to inadequate dietary iron  intake 06/06/2019   Chronic diastolic CHF (congestive heart failure) (HCC) 10/30/2018   CRI (chronic renal insufficiency), stage 3 (moderate) 10/25/2018   Venous stasis dermatitis of both lower extremities 01/29/2018   COPD with  asthma (HCC) 03/02/2017   Obesity (BMI 30.0-34.9) 04/01/2013   BPH (benign prostatic hyperplasia) 09/08/2011   B12 deficiency 08/13/2010   Vitamin D  deficiency 11/10/2008   HLD (hyperlipidemia) 11/10/2008   Essential hypertension 11/10/2008   Osteoarthritis 11/10/2008   PCP:  Franco, [facility] Pharm[facility]:   [id number] - [place], [place] - [address] AT [facility] [address] [place] [address] Phone: [phone] Fax: [phone]  [facility] - [place], [place] - [address] [facility] [employer] [address] [place] [address] Phone: [phone] Fax: [phone]  [id number] - [place], [place] - [address] [place] [address] Phone: [phone] Fax: [phone]  [facility] - [place], [place] - [address] [place] [address] Phone: [phone] Fax: [phone]  [place], [place] - [address] [facility] [address] [place] [address] Phone: [phone] Fax: [phone]  [facility] - [place] , [place] - [address] [place]  [place] [address] Phone: [phone] Fax: [phone]  [facility] - [facility] [address] [place] [address] Phone: [phone] Fax: [phone]     Social Drivers of Health (SDOH) Social History: SDOH Screenings   Food Insecurity: No Food Insecurity (08/17/2024)  Housing: Low Risk (08/17/2024)  Transportation Needs: No Transportation Needs (08/17/2024)  Utilities: Not At Risk (08/17/2024)  Alcohol Screen: Low Risk (07/13/2023)  Depression (PHQ2-9): Low Risk (10/27/2022)  Financial Resource Strain: Low Risk (07/13/2023)  Physical Activity: Sufficiently Active (07/13/2023)  Social Connections: Moderately Isolated (08/15/2024)  Stress: Patient Unable To Answer (07/13/2023)  Tobacco Use: Low Risk  (08/15/2024)  Health Literacy: Inadequate Health Literacy (07/13/2023)   SDOH Interventions: Food Insecurity Interventions: Intervention Not Indicated, Inpatient TOC Housing Interventions: Intervention Not Indicated, Inpatient TOC Transportation Interventions: Intervention Not Indicated, Inpatient TOC Utilities Interventions: Intervention Not Indicated, Inpatient TOC Social Connections Interventions: Intervention Not Indicated   Readmission Risk Interventions    08/17/2024   11:13 AM  Readmission Risk Prevention Plan  Transportation Screening Complete  Medication Review (RN Care Manager) Complete  PCP or Specialist appointment within 3-5 days of discharge Complete  HRI or Home Care Consult Complete  SW Recovery Care/Counseling Consult Complete  Palliative Care Screening Complete  Skilled Nursing Facility Not Applicable

## 2024-08-19 ENCOUNTER — Telehealth: Payer: Self-pay

## 2024-08-19 ENCOUNTER — Other Ambulatory Visit: Payer: Self-pay

## 2024-08-19 DIAGNOSIS — C7951 Secondary malignant neoplasm of bone: Secondary | ICD-10-CM

## 2024-08-19 NOTE — Telephone Encounter (Signed)
 TJ this isn't your patient anymore is it ? I think they moved to [place] correct ?

## 2024-08-19 NOTE — Telephone Encounter (Signed)
 I have not seen him recently so a HHC referral from me would not be valid

## 2024-08-19 NOTE — Telephone Encounter (Signed)
 Copied from CRM #[id number]. Topic: Clinical - Home Health Verbal Orders >> Aug 19, 2024  9:34 AM Berwyn MATSU wrote: Caller/Agency: [facility]  Callback Number: [phone] FAX: [phone] Service Requested: Skilled Nursing and physical therapy  Frequency: evaluation for both  Any new concerns about the patient? No; Patient was discharged on 08/17/2024 and is requesting services again.

## 2024-08-20 ENCOUNTER — Inpatient Hospital Stay

## 2024-08-20 ENCOUNTER — Ambulatory Visit (HOSPITAL_COMMUNITY)
Admission: RE | Admit: 2024-08-20 | Discharge: 2024-08-20 | Disposition: A | Discharge status: Home / Self Care | Source: Ambulatory Visit | Attending: Physician Assistant | Admitting: Physician Assistant

## 2024-08-20 ENCOUNTER — Inpatient Hospital Stay: Admitting: Physician Assistant

## 2024-08-20 ENCOUNTER — Inpatient Hospital Stay: Attending: Hematology

## 2024-08-20 ENCOUNTER — Other Ambulatory Visit: Payer: Self-pay

## 2024-08-20 VITALS — BP 97/74 | HR 95 | Temp 98.1°F | Resp 18 | Wt 162.6 lb

## 2024-08-20 DIAGNOSIS — C61 Malignant neoplasm of prostate: Secondary | ICD-10-CM | POA: Insufficient documentation

## 2024-08-20 DIAGNOSIS — Z87440 Personal history of urinary (tract) infections: Secondary | ICD-10-CM | POA: Insufficient documentation

## 2024-08-20 DIAGNOSIS — R739 Hyperglycemia, unspecified: Secondary | ICD-10-CM | POA: Diagnosis not present

## 2024-08-20 DIAGNOSIS — D72829 Elevated white blood cell count, unspecified: Secondary | ICD-10-CM

## 2024-08-20 DIAGNOSIS — C7951 Secondary malignant neoplasm of bone: Secondary | ICD-10-CM | POA: Diagnosis not present

## 2024-08-20 DIAGNOSIS — Z79899 Other long term (current) drug therapy: Secondary | ICD-10-CM | POA: Insufficient documentation

## 2024-08-20 LAB — CBC WITH DIFFERENTIAL (CANCER CENTER ONLY)
Abs Immature Granulocytes: 0.08 K/uL — ABNORMAL HIGH (ref 0.00–0.07)
Basophils Absolute: 0.1 K/uL (ref 0.0–0.1)
Basophils Relative: 0 %
Eosinophils Absolute: 0.5 K/uL (ref 0.0–0.5)
Eosinophils Relative: 3 %
HCT: 35.7 % — ABNORMAL LOW (ref 39.0–52.0)
Hemoglobin: 11.6 g/dL — ABNORMAL LOW (ref 13.0–17.0)
Immature Granulocytes: 1 %
Lymphocytes Relative: 14 %
Lymphs Abs: 2.3 K/uL (ref 0.7–4.0)
MCH: 29.1 pg (ref 26.0–34.0)
MCHC: 32.5 g/dL (ref 30.0–36.0)
MCV: 89.5 fL (ref 80.0–100.0)
Monocytes Absolute: 1.5 K/uL — ABNORMAL HIGH (ref 0.1–1.0)
Monocytes Relative: 9 %
Neutro Abs: 11.6 K/uL — ABNORMAL HIGH (ref 1.7–7.7)
Neutrophils Relative %: 73 %
Platelet Count: 291 K/uL (ref 150–400)
RBC: 3.99 MIL/uL — ABNORMAL LOW (ref 4.22–5.81)
RDW: 14.8 % (ref 11.5–15.5)
WBC Count: 16 K/uL — ABNORMAL HIGH (ref 4.0–10.5)
nRBC: 0 % (ref 0.0–0.2)

## 2024-08-20 LAB — URINALYSIS, COMPLETE (UACMP) WITH MICROSCOPIC
Bilirubin Urine: NEGATIVE
Glucose, UA: >=500 mg/dL — AB
Hgb urine dipstick: NEGATIVE
Ketones, ur: NEGATIVE mg/dL
Nitrite: NEGATIVE
Protein, ur: 100 mg/dL — AB
Specific Gravity, Urine: 1.009 (ref 1.005–1.030)
WBC, UA: >50 WBC/hpf (ref 0–5)
pH: 5 (ref 5.0–8.0)

## 2024-08-20 LAB — CMP (CANCER CENTER ONLY)
ALT: 12 U/L (ref 0–44)
AST: 17 U/L (ref 15–41)
Albumin: 3.1 g/dL — ABNORMAL LOW (ref 3.5–5.0)
Alkaline Phosphatase: 176 U/L — ABNORMAL HIGH (ref 38–126)
Anion gap: 10 (ref 5–15)
BUN: 15 mg/dL (ref 8–23)
CO2: 25 mmol/L (ref 22–32)
Calcium: 8.9 mg/dL (ref 8.9–10.3)
Chloride: 101 mmol/L (ref 98–111)
Creatinine: 1.55 mg/dL — ABNORMAL HIGH (ref 0.61–1.24)
GFR, Estimated: 41 mL/min — ABNORMAL LOW
Glucose, Bld: 426 mg/dL — ABNORMAL HIGH (ref 70–99)
Potassium: 4.4 mmol/L (ref 3.5–5.1)
Sodium: 136 mmol/L (ref 135–145)
Total Bilirubin: <0.2 mg/dL (ref 0.0–1.2)
Total Protein: 6.2 g/dL — ABNORMAL LOW (ref 6.5–8.1)

## 2024-08-20 LAB — PSA: Prostatic Specific Antigen: 42.9 ng/mL — ABNORMAL HIGH (ref 0.00–4.00)

## 2024-08-20 MED ORDER — LEUPROLIDE ACETATE (3 MONTH) 22.5 MG ~~LOC~~ KIT
22.5000 mg | PACK | Freq: Once | SUBCUTANEOUS | Status: AC
  Administered 2024-08-20: 22.5 mg via SUBCUTANEOUS
  Filled 2024-08-20: qty 22.5

## 2024-08-20 NOTE — Progress Notes (Signed)
 "   HEMATOLOGY/ONCOLOGY CLINIC NOTE  Date of Service: 08/20/2024  Patient Care Team: Collective, [facility] as PCP - General Dann Candyce RAMAN, MD as PCP - Cardiology (Cardiology) Szabat, Toribio BROCKS, [facility] (Inactive) as Pharmacist (Pharmacist) Jarold Mayo, MD as Consulting Physician (Ophthalmology) Onesimo Emaline Brink, MD as Consulting Physician (Hematology)  CHIEF COMPLAINTS/PURPOSE OF CONSULTATION:  Poorly differentiated prostate cancer  INTERVAL HISTORY Nicholas Burton is a wonderful 89 y.o. male who is here for follow-up for continued evaluation and management of his metastatic prostate cancer.  He is accompanied by his for this visit.  He was recently hospitalized for pneumonia and UTI.  He completed 3-day course of azithromycin  and is still on Ceftin  therapy.  He denies any changes to his appetite or weight.  He denies nausea, vomiting or bowel habit changes.  He continues to utilize a Foley catheter to help with urination.  He denies foul-smelling odor or dark urine.  He denies fevers, chills, night sweats, shortness of breath, chest pain or cough.  He has no other complaints.  MEDICAL HISTORY:  Past Medical History:  Diagnosis Date   Allergy    ANEMIA ASSOCIATED W/OTHER [facility] NUTRITIONAL DEFIC 08/11/2010   Qualifier: Diagnosis of  By: Joshua MD, Debby CROME.    Cancer [facility])    skin   Cataract    Chronic combined systolic and diastolic congestive heart failure (HCC) 10/30/2018   COPD with asthma (HCC) 03/02/2017   CRI (chronic renal insufficiency), stage 3 (moderate) 10/25/2018   Deficiency anemia 10/25/2018   Diabetes mellitus    type 2   DOE (dyspnea on exertion) 09/03/2018   Edema 06/11/2009   Qualifier: Diagnosis of  By: Joshua MD, Debby CROME.    History of skin cancer    Hyperlipidemia with target LDL less than 100 11/10/2008        Hypertension    Memory loss 05/14/2009   Obesity (BMI 30.0-34.9) 04/01/2013   Osteoarthritis    PSA elevation 10/25/2018   SKIN CANCER, HX OF  11/10/2008   Qualifier: Diagnosis of  By: Joshua MD, Debby CROME.    Snoring 04/03/2014   Type II diabetes mellitus with manifestations (HCC) 11/10/2008   Estimated Creatinine Clearance: 38 mL/min (A) (by C-G formula based on SCr of 1.52 mg/dL (H)).    Venous stasis dermatitis of both lower extremities 01/29/2018   Vitamin D  deficiency 11/10/2008    SURGICAL HISTORY: Past Surgical History:  Procedure Laterality Date   CHOLECYSTECTOMY     INGUINAL HERNIA REPAIR     x 3   IR FLUORO GUIDED NEEDLE PLC ASPIRATION/INJECTION LOC  10/07/2022   JOINT REPLACEMENT     KNEE SURGERY     x 2   MELANOMA EXCISION     x 3 -- Left arm   TOTAL KNEE ARTHROPLASTY     x 3    SOCIAL HISTORY: Social History   Socioeconomic History   Marital status: Married    Spouse name: Nicholas Burton   Number of children: 4   Years of education: Not on file   Highest education level: Not on file  Occupational History   Occupation: retired    Associate Professor: RETIRED  Tobacco Use   Smoking status: Never   Smokeless tobacco: Never  Vaping Use   Vaping status: Never Used  Substance and Sexual Activity   Alcohol use: No   Drug use: No   Sexual activity: Yes    Birth control/protection: None  Other Topics Concern   Not on file  Social History Narrative   No regular exercise   Lives with wife.     Social Drivers of Health   Tobacco Use: Low Risk (08/15/2024)   Patient History    Smoking Tobacco Use: Never    Smokeless Tobacco Use: Never    Passive Exposure: Not on file  Financial Resource Strain: Low Risk (07/13/2023)   Overall Financial Resource Strain (CARDIA)    Difficulty of Paying Living Expenses: Not hard at all  Food Insecurity: No Food Insecurity (08/17/2024)   Epic    Worried About Radiation Protection Practitioner of Food in the Last Year: Never true    Ran Out of Food in the Last Year: Never true  Transportation Needs: No Transportation Needs (08/17/2024)   Epic    Lack of Transportation (Medical): No    Lack of Transportation  (Non-Medical): No  Physical Activity: Sufficiently Active (07/13/2023)   Exercise Vital Sign    Days of Exercise per Week: 6 days    Minutes of Exercise per Session: 30 min  Stress: Patient Unable To Answer (07/13/2023)   [employer] - Occupational Stress Questionnaire    Feeling of Stress : Patient unable to answer  Social Connections: Moderately Isolated (08/15/2024)   Social Connection and Isolation Panel    Frequency of Communication with Friends and Family: Never    Frequency of Social Gatherings with Friends and Family: Never    Attends Religious Services: Never    Database Administrator or Organizations: Yes    Attends Banker Meetings: Never    Marital Status: Married  Catering Manager Violence: Not At Risk (08/17/2024)   Epic    Fear of Current or Ex-Partner: No    Emotionally Abused: No    Physically Abused: No    Sexually Abused: No  Depression (PHQ2-9): Low Risk (10/27/2022)   Depression (PHQ2-9)    PHQ-2 Score: 0  Alcohol Screen: Low Risk (07/13/2023)   Alcohol Screen    Last Alcohol Screening Score (AUDIT): 0  Housing: Low Risk (08/17/2024)   Epic    Unable to Pay for Housing in the Last Year: No    Number of Times Moved in the Last Year: 0    Homeless in the Last Year: No  Utilities: Not At Risk (08/17/2024)   Epic    Threatened with loss of utilities: No  Health Literacy: Inadequate Health Literacy (07/13/2023)   B1300 Health Literacy    Frequency of need for help with medical instructions: Always    FAMILY HISTORY: Family History  Problem Relation Age of Onset   Heart disease Mother    Diabetes Mother    Heart disease Father    Cancer Sister    Cancer Brother    Diabetes Son    Diabetes Other     ALLERGIES:  is allergic to ace inhibitors, oxycodone, and penicillins.  MEDICATIONS:  Current Outpatient Medications  Medication Sig Dispense Refill   acetaminophen  (TYLENOL ) 325 MG tablet Take 1-2 tablets  (325-650 mg total) by mouth every 6 (six) hours as needed for mild pain.     azithromycin  (ZITHROMAX ) 250 MG tablet Take 1 tablet (250 mg total) by mouth daily for 3 days. 3 tablet 0   BD AUTOSHIELD DUO 30G X 5 MM MISC USE AS DIRECTED TO ADMINISTER INSULIN  100 each 0   cefUROXime  (CEFTIN ) 500 MG tablet Take 1 tablet (500 mg total) by mouth 2 (two) times daily with a meal for 7 days. 14 tablet 0  cholecalciferol  (VITAMIN D3) 25 MCG (1000 UNIT) tablet Take 1 tablet by mouth once a day 30 tablet 11   cyanocobalamin  (VITAMIN B12) 500 MCG tablet Take 1 tablet (500 mcg total) by mouth daily. (Patient not taking: Reported on 08/15/2024) 30 tablet 0   enzalutamide  (XTANDI ) 80 MG tablet Take 1 tablet (80 mg total) by mouth daily. (Patient taking differently: Take 80 mg by mouth at bedtime.) 30 tablet 11   FERROUS FUMARATE-FOLIC ACID  PO Take 1 tablet by mouth See admin instructions. Multivitamin-ferrous fumarate-folic acid  18 mg-400 mcg tablet - Take 1 tablet by mouth once a day with breakfast     insulin  aspart (NOVOLOG ) 100 UNIT/ML injection Inject 6-10 Units into the skin as needed for high blood sugar (PER SLIDING SCALE).     insulin  glargine (LANTUS  SOLOSTAR) 100 UNIT/ML Solostar Pen Inject 15 Units into the skin at bedtime. (Patient taking differently: Inject 15 Units into the skin at bedtime. Inject 5 units into the skin in the morning and 15 units at bedtime) 3 mL 11   ipratropium-albuterol  (DUONEB) 0.5-2.5 (3) MG/3ML SOLN Take 3 mLs by nebulization every 6 (six) hours as needed (for shortness for breath).     magnesium  gluconate (MAGONATE) 500 (27 Mg) MG TABS tablet Take 1 tablet (500 mg total) by mouth at bedtime. 30 tablet 11   melatonin 3 MG TABS tablet Take 1 tablet by mouth every night at bedtime as needed for insomnia 30 tablet 11   metoprolol  tartrate (LOPRESSOR ) 25 MG tablet TAKE 1/2 TABLET BY MOUTH TWICE DAILY (Patient not taking: Reported on 08/15/2024) 90 tablet 0   Multiple  Vitamins-Minerals (MULTIVITAMIN ADULTS 50+) TABS Take 1 tablet by mouth daily. (Patient not taking: Reported on 08/15/2024) 30 tablet 11   Multiple Vitamins-Minerals (PRESERVISION/LUTEIN) CAPS Take 1 capsule by mouth in the morning and at bedtime.     Potassium Chloride  ER 20 MEQ TBCR Take 1 tablet (20 mEq total) by mouth every other day. Take with demadex  90 tablet 11   SYSTANE COMPLETE 0.6 % SOLN Place 1 drop into both eyes 3 (three) times daily as needed (for dryness).     tamsulosin  (FLOMAX ) 0.4 MG CAPS capsule Take 1 capsule by mouth every night at bedtime 30 capsule 11   torsemide  (DEMADEX ) 10 MG tablet Take 1 tablet by mouth every other day (Patient taking differently: Take 10 mg by mouth every Monday, Wednesday, and Friday.) 15 tablet 11   Vibegron  (GEMTESA ) 75 MG TABS Take 1 tablet (75 mg total) by mouth daily. (Patient not taking: Reported on 08/15/2024) 90 tablet 0   vitamin B-12 (CYANOCOBALAMIN ) 500 MCG tablet Take 1 tablet (500 mcg total) by mouth daily. 30 tablet 11   No current facility-administered medications for this visit.    REVIEW OF SYSTEMS:    10 Point review of Systems was done is negative except as noted above.  PHYSICAL EXAMINATION: ECOG PERFORMANCE STATUS: 2 - Symptomatic, <50% confined to bed  . Vitals:   08/20/24 1151  BP: 97/74  Pulse: 95  Resp: 18  Temp: 98.1 F (36.7 C)   Filed Weights   08/20/24 1151  Weight: 162 lb 9.6 oz (73.8 kg)  .Body mass index is 24.72 kg/m.  NAD GENERAL:alert, in no acute distress and comfortable SKIN: no acute rashes, no significant lesions EYES: conjunctiva are pink and non-injected, sclera anicteric LUNGS: clear to auscultation b/l with normal respiratory effort HEART: regular rate & rhythm Extremity: no pedal edema PSYCH: alert & oriented x 3 with  fluent speech NEURO: no focal motor/sensory deficits  LABORATORY DATA:  I have reviewed the data as listed .    Latest Ref Rng & Units 08/20/2024   11:32 AM  08/17/2024    5:40 AM 08/16/2024    8:20 AM  CBC  WBC 4.0 - 10.5 K/uL 16.0  13.5  14.3   Hemoglobin 13.0 - 17.0 g/dL 88.3  88.6  88.7   Hematocrit 39.0 - 52.0 % 35.7  35.5  35.0   Platelets 150 - 400 K/uL 291  201  217    .    Latest Ref Rng & Units 08/20/2024   11:32 AM 08/17/2024    5:40 AM 08/16/2024   12:58 PM  CMP  Glucose 70 - 99 mg/dL 573  817    BUN 8 - 23 mg/dL 15  18    Creatinine 9.38 - 1.24 mg/dL 8.44  8.59    Sodium 864 - 145 mmol/L 136  136    Potassium 3.5 - 5.1 mmol/L 4.4  4.2  4.3   Chloride 98 - 111 mmol/L 101  102    CO2 22 - 32 mmol/L 25  23    Calcium  8.9 - 10.3 mg/dL 8.9  8.7    Total Protein 6.5 - 8.1 g/dL 6.2     Total Bilirubin 0.0 - 1.2 mg/dL <9.7     Alkaline Phos 38 - 126 U/L 176     AST 15 - 41 U/L 17     ALT 0 - 44 U/L 12      PSA 8.3 up from 1.5  RADIOGRAPHIC STUDIES: I have personally reviewed the radiological images as listed and agreed with the findings in the report. DG ESOPHAGUS W SINGLE CM (SOL OR THIN BA) Result Date: 08/16/2024 CLINICAL DATA:  89 year old male inpatient with dysphagia with coughing episodes and globus sensation in the chest. EXAM: ESOPHOGRAM/BARIUM SWALLOW TECHNIQUE: Single contrast examination was performed using  thin barium. FLUOROSCOPY: Radiation Exposure Index (as provided by the fluoroscopic device): 38.3 mGy Kerma COMPARISON:  10/11/2022 CT chest, abdomen and pelvis. FINDINGS: Very limited examination due to patient mobility limitations. Grossly normal oral and pharyngeal phases of swallowing with no evidence of laryngeal penetration or tracheobronchial aspiration. Moderate esophageal dysmotility characterized by prominent tertiary contractions and proximal scaphoid, compatible with presbyesophagus pattern. No hiatal hernia. Mild smooth narrowing of the lower thoracic esophagus, at which location the swallowed 13 mm barium tablet became lodged for several minutes despite multiple water and barium swallows, with  eventual clearance of the tablet into the stomach with multiple upright barium swallows. No gross esophageal mass. IMPRESSION: 1. Grossly normal oral and pharyngeal phase of swallowing, with no laryngeal penetration or tracheobronchial aspiration observed. 2. Moderate esophageal dysmotility with presbyesophagus pattern. 3. Possible mild peptic stricture in the lower thoracic esophagus, see comments. Electronically Signed   By: Selinda DELENA Blue M.D.   On: 08/16/2024 12:47   DG Chest 2 View Result Date: 08/14/2024 EXAM: 2 VIEW(S) XRAY OF THE CHEST 08/14/2024 11:11:00 PM COMPARISON: 01/15/2024 CLINICAL HISTORY: assess for pneumonia FINDINGS: LUNGS AND PLEURA: Low lung volumes. Patchy airspace opacity in left upper lung. Chronic blunting of left costophrenic sulcus. No pneumothorax. HEART AND MEDIASTINUM: No acute abnormality of the cardiac and mediastinal silhouettes. BONES AND SOFT TISSUES: No acute osseous abnormality. IMPRESSION: 1. Patchy airspace opacity in the left upper lung, suspicious for pneumonia. Electronically signed by: [name] Devonshire MD 08/14/2024 11:29 PM EST RP Workstation: [id number]     ASSESSMENT & PLAN:  NIKOLAI WILCZAK is a 89 y.o. male who presents to the clinic for continued management of metastatic prostate cancer.   #Metastatic prostate cancer: --Currently on Xtandi  80 mg PO daily and Lupron  every 3 months. --Labs from today reviewed with patient and son. WBC 16.0, Hgb 11.6, Plt 291, creatinine stable at 1.55, LFTs normal. PSA pending.  --Proceed with Lupron  injection today as planned. Continue Xtandi  without any dose modifications.   #Recent hospitalization for  UTI and pneumonia: --Hospitalized from 08/14/2024-08/17/2024. Treated with ceftriaxone  and azithromycin . Discharged on Azitromycin x 3 days and Ceftin  500 mg BID x 7 days. --Since WBC has trended back up, will repeat UA/culture and chest xray.  --Advised to monitor closely for fevers, chills, cough and new/worsening urinary  symptoms --Continues to have foley catheter.   #Hyperglycemia: --Glucose was 426 today. Patient's son reports levels have been fluctuating at home. Previous taking 20 units at bedtime but resulted in low glucose levels in the morning so switched to 5 units in the AM and 15 units in PM.  --Since glucose levels fluctuate, I advised to follow up with his provider at his skilled facility today to discussed adjusting his insulin  dosage.  --Advised to stay hydrated.   FOLLOW-UP: RTC in 3 months with labs, follow up and Lupron  injection  All of the patient's questions were answered with apparent satisfaction. The patient knows to call the clinic with any problems, questions or concerns.  I have spent a total of 30 minutes minutes of face-to-face and non-face-to-face time, preparing to see the patient,performing a medically appropriate examination, counseling and educating the patient, ordering tests/procedures,documenting clinical information in the electronic health record, independently interpreting results and communicating results to the patient, and care coordination.   Johnston Police PA-C Dept of Hematology and Oncology [facility] Phone: [phone]  "

## 2024-08-20 NOTE — Telephone Encounter (Signed)
 Called and spoke with a representative at [facility]. Informed them of Dr.[name]' response. They will be reaching out to the patient to make sure that they aren't being seen elsewhere

## 2024-08-21 LAB — URINE CULTURE: Culture: NO GROWTH

## 2024-08-26 ENCOUNTER — Ambulatory Visit: Payer: Self-pay

## 2024-08-26 ENCOUNTER — Other Ambulatory Visit: Payer: Self-pay

## 2024-08-26 NOTE — Telephone Encounter (Signed)
 Spoke with pt's son, Zaron, and he will contact [facility] to have CBC and chest x-ray repeated.

## 2024-08-26 NOTE — Telephone Encounter (Signed)
-----   Message from Johnston ONEIDA Police sent at 08/26/2024 10:58 AM EST ----- Can you let patient's son know that urine culture did not grow any  bacteria. Chest xray showed possible pneumonia but not definitive. Can you see if his nursing facility can repeat a CBC with diff  and chest xray?  Thanks, Johnston

## 2024-08-31 ENCOUNTER — Encounter: Payer: Self-pay | Admitting: Hematology

## 2024-08-31 ENCOUNTER — Other Ambulatory Visit (HOSPITAL_COMMUNITY): Payer: Self-pay

## 2024-09-02 ENCOUNTER — Other Ambulatory Visit: Payer: Self-pay

## 2024-09-02 ENCOUNTER — Encounter: Payer: Self-pay | Admitting: Hematology

## 2024-09-03 ENCOUNTER — Ambulatory Visit: Payer: Self-pay | Admitting: Pharmacist

## 2024-09-03 ENCOUNTER — Other Ambulatory Visit (HOSPITAL_COMMUNITY): Payer: Self-pay

## 2024-09-03 ENCOUNTER — Other Ambulatory Visit: Payer: Self-pay

## 2024-09-06 ENCOUNTER — Other Ambulatory Visit: Payer: Self-pay

## 2024-09-09 ENCOUNTER — Other Ambulatory Visit: Payer: Self-pay

## 2024-09-10 ENCOUNTER — Other Ambulatory Visit (HOSPITAL_COMMUNITY): Payer: Self-pay

## 2024-09-10 ENCOUNTER — Other Ambulatory Visit: Payer: Self-pay

## 2024-09-17 ENCOUNTER — Other Ambulatory Visit: Payer: Self-pay

## 2024-09-17 ENCOUNTER — Other Ambulatory Visit (HOSPITAL_COMMUNITY): Payer: Self-pay

## 2024-09-17 MED ORDER — METOPROLOL TARTRATE 25 MG PO TABS
12.5000 mg | ORAL_TABLET | Freq: Two times a day (BID) | ORAL | 11 refills | Status: AC
  Filled 2024-09-17: qty 30, 30d supply, fill #0

## 2024-09-30 ENCOUNTER — Encounter: Payer: Self-pay | Admitting: Hematology

## 2024-09-30 ENCOUNTER — Other Ambulatory Visit: Payer: Self-pay

## 2024-10-01 ENCOUNTER — Other Ambulatory Visit: Payer: Self-pay

## 2024-10-02 ENCOUNTER — Other Ambulatory Visit: Payer: Self-pay

## 2024-10-03 ENCOUNTER — Other Ambulatory Visit: Payer: Self-pay

## 2024-10-03 ENCOUNTER — Other Ambulatory Visit (HOSPITAL_COMMUNITY): Payer: Self-pay

## 2024-11-19 ENCOUNTER — Other Ambulatory Visit

## 2024-11-19 ENCOUNTER — Ambulatory Visit: Admitting: Hematology

## 2024-11-19 ENCOUNTER — Ambulatory Visit
